# Patient Record
Sex: Female | Born: 1950 | Race: White | Hispanic: No | Marital: Married | Smoking: Former smoker
Health system: Western US, Academic
[De-identification: ages and names within clinical notes are randomized; demographics above are authoritative.]

## PROBLEM LIST (undated history)

## (undated) DIAGNOSIS — M3500 Sicca syndrome, unspecified: Secondary | ICD-10-CM

## (undated) DIAGNOSIS — I272 Pulmonary hypertension, unspecified: Secondary | ICD-10-CM

## (undated) MED ORDER — FUROSEMIDE 40 MG OR TABS
ORAL_TABLET | ORAL | 1 refills | Status: AC
Start: 2016-11-05 — End: ?

## (undated) MED ORDER — WARFARIN SODIUM 5 MG OR TABS
5.00 mg | ORAL_TABLET | Freq: Every day | ORAL | Status: AC
Start: 2013-02-21 — End: ?

## (undated) MED ORDER — CALCIUM ACETATE (PHOS BINDER) 667 MG/5ML PO SOLN
667.00 mg | Freq: Three times a day (TID) | ORAL | 0 refills | Status: AC
Start: 2017-08-27 — End: ?

## (undated) MED ORDER — FUROSEMIDE 40 MG OR TABS
80.00 mg | ORAL_TABLET | Freq: Three times a day (TID) | ORAL | 0 refills | Status: AC
Start: 2017-08-27 — End: ?

## (undated) MED ORDER — DAPSONE 100 MG OR TABS
100.00 mg | ORAL_TABLET | Freq: Every day | ORAL | 1 refills | Status: AC
Start: 2016-06-21 — End: ?

## (undated) MED ORDER — VITAMIN D 1000 UNIT OR TABS
1000.00 [IU] | ORAL_TABLET | Freq: Every day | ORAL | Status: AC
Start: 2013-02-21 — End: ?

## (undated) MED ORDER — POTASSIUM CHLORIDE CRYS CR 20 MEQ OR TBCR
20.00 meq | EXTENDED_RELEASE_TABLET | Freq: Every day | ORAL | 0 refills | Status: AC
Start: 2017-08-27 — End: ?

## (undated) MED ORDER — GLYCINE DILUENT IV SOLN
29.00 ng/kg/min | INTRAVENOUS | Status: AC
Start: 2016-05-16 — End: ?

## (undated) MED ORDER — PREDNISONE 20 MG OR TABS
40.00 mg | ORAL_TABLET | Freq: Every day | ORAL | 0 refills | Status: AC
Start: 2016-06-21 — End: ?

## (undated) MED ORDER — POTASSIUM CHLORIDE CRYS CR 20 MEQ OR TBCR
20.00 meq | EXTENDED_RELEASE_TABLET | Freq: Three times a day (TID) | ORAL | Status: AC
Start: 2013-02-21 — End: ?

## (undated) MED ORDER — SILDENAFIL CITRATE 20 MG OR TABS
80.00 mg | ORAL_TABLET | Freq: Three times a day (TID) | ORAL | Status: AC
Start: 2013-02-21 — End: ?

## (undated) MED ORDER — FUROSEMIDE 40 MG OR TABS
100.00 mg | ORAL_TABLET | Freq: Three times a day (TID) | ORAL | 3 refills | Status: AC
Start: 2017-07-14 — End: ?

## (undated) MED ORDER — FUROSEMIDE 20 MG OR TABS
60.00 mg | ORAL_TABLET | Freq: Two times a day (BID) | ORAL | Status: AC
Start: 2013-02-20 — End: ?

## (undated) MED ORDER — DIGOXIN 0.125 MG OR TABS
250.00 ug | ORAL_TABLET | Freq: Every day | ORAL | Status: AC
Start: 2013-02-21 — End: ?

## (undated) MED ORDER — SACCHAROMYCES BOULARDII 250 MG OR CAPS
250.00 mg | ORAL_CAPSULE | Freq: Two times a day (BID) | ORAL | Status: AC
Start: 2017-07-01 — End: 2017-07-11

## (undated) MED ORDER — SPIRONOLACTONE 25 MG OR TABS
50.00 mg | ORAL_TABLET | Freq: Two times a day (BID) | ORAL | Status: AC
Start: 2013-02-20 — End: ?

## (undated) MED ORDER — DIGOX 125 MCG PO TABS
ORAL_TABLET | ORAL | 1 refills | Status: AC
Start: 2016-11-05 — End: ?

## (undated) MED ORDER — FOLIC ACID 400 MCG OR TABS
0.40 mg | ORAL_TABLET | Freq: Every day | ORAL | Status: AC
Start: 2013-02-21 — End: ?

## (undated) MED ORDER — GLYCINE DILUENT IV SOLN
35.00 ng/kg/min | INTRAVENOUS | Status: AC
Start: 2017-07-15 — End: ?

## (undated) MED ORDER — VANCOMYCIN 50 MG/ML ORAL SOLN (COMPOUNDED) (~~LOC~~)
125.00 mg | Freq: Two times a day (BID) | ORAL | Status: AC
Start: 2017-07-14 — End: 2017-07-26

## (undated) MED ORDER — NEPHROCAPS 1 MG OR CAPS
1.00 | ORAL_CAPSULE | Freq: Every day | ORAL | Status: AC
Start: 2017-08-13 — End: ?

---
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
## 2001-02-25 ENCOUNTER — Other Ambulatory Visit (INDEPENDENT_AMBULATORY_CARE_PROVIDER_SITE_OTHER): Payer: Self-pay | Admitting: Internal Medicine

## 2001-10-04 ENCOUNTER — Other Ambulatory Visit (INDEPENDENT_AMBULATORY_CARE_PROVIDER_SITE_OTHER): Payer: Self-pay | Admitting: Internal Medicine

## 2002-03-14 ENCOUNTER — Other Ambulatory Visit (INDEPENDENT_AMBULATORY_CARE_PROVIDER_SITE_OTHER): Payer: Self-pay | Admitting: Internal Medicine

## 2002-07-25 ENCOUNTER — Other Ambulatory Visit (INDEPENDENT_AMBULATORY_CARE_PROVIDER_SITE_OTHER): Payer: Self-pay | Admitting: Internal Medicine

## 2002-11-15 ENCOUNTER — Other Ambulatory Visit (INDEPENDENT_AMBULATORY_CARE_PROVIDER_SITE_OTHER): Payer: Self-pay | Admitting: Internal Medicine

## 2003-04-17 ENCOUNTER — Other Ambulatory Visit (HOSPITAL_BASED_OUTPATIENT_CLINIC_OR_DEPARTMENT_OTHER): Payer: Self-pay | Admitting: Internal Medicine

## 2003-04-17 LAB — URIC ACID, BLOOD: Uric Acid: 9.4 mg/dL — ABNORMAL HIGH (ref 2.2–5.7)

## 2003-04-17 LAB — PROTHROMBIN TIME, BLOOD
INR: 1.3
Protime, Control: 10 s
Protime: 12.6 s — ABNORMAL HIGH (ref 9–12)

## 2003-07-14 ENCOUNTER — Other Ambulatory Visit (HOSPITAL_BASED_OUTPATIENT_CLINIC_OR_DEPARTMENT_OTHER): Payer: Self-pay | Admitting: Internal Medicine

## 2003-07-14 LAB — HEMOGRAM, BLOOD
Hct: 41.7 % (ref 36.0–46.0)
Hgb: 13.8 gm/dL (ref 12.0–16.0)
MCH: 26.2 pg — ABNORMAL LOW (ref 27–31)
MCHC: 33 % (ref 32–37)
MCV: 79.2 um3 — ABNORMAL LOW (ref 82.0–98.0)
MPV: 8.8 fL (ref 7.4–10.4)
Plt Count: 178 10*3/uL (ref 130–400)
RBC: 5.26 10*6/uL — ABNORMAL HIGH (ref 4.00–5.00)
RDW: 17.8 % — ABNORMAL HIGH (ref 10–15)
WBC: 4.6 10*3/uL (ref 4.0–11.0)

## 2003-07-14 LAB — PROTHROMBIN TIME, BLOOD
INR: 1.2
Protime, Control: 10.1 s
Protime: 12.3 s — ABNORMAL HIGH (ref 9–12)

## 2003-07-19 LAB — AEROBIC PANEL-RTNCL/GRMSM

## 2003-07-31 ENCOUNTER — Ambulatory Visit: Admitting: Diagnostic Radiology

## 2003-08-11 ENCOUNTER — Other Ambulatory Visit (HOSPITAL_BASED_OUTPATIENT_CLINIC_OR_DEPARTMENT_OTHER): Payer: Self-pay | Admitting: Internal Medicine

## 2003-08-11 LAB — COMPREHENSIVE METABOLIC PANEL, BLOOD
ALT (SGPT): 12 [IU]/L (ref 10–45)
AST (SGOT): 18 [IU]/L (ref 10–45)
Albumin: 3.6 g/dL (ref 3.3–5.0)
Alkaline Phos: 64 [IU]/L (ref 30–130)
BUN: 18 mg/dL (ref 8–18)
Bicarbonate: 25 meq/L (ref 24–31)
Bilirubin, Tot: 0.9 mg/dL (ref <1.2)
Calcium: 8.8 mg/dL (ref 8.8–10.3)
Chloride: 106 meq/L (ref 97–107)
Creatinine: 1 mg/dL (ref 0.5–1.5)
Glucose: 90 mg/dL (ref 65–110)
Potassium: 4.2 meq/L (ref 3.5–5.0)
Sodium: 138 meq/L (ref 135–145)
Total Protein: 7.7 g/dL (ref 6.0–8.0)

## 2003-08-11 LAB — HEMOGRAM, BLOOD
Hct: 39.6 % (ref 36.0–46.0)
Hgb: 13.2 g/dL (ref 12.0–16.0)
MCH: 25.9 pg — ABNORMAL LOW (ref 27–31)
MCHC: 33.2 % (ref 32–37)
MCV: 77.9 um3 — ABNORMAL LOW (ref 82.0–98.0)
Plt Count: 177 10*3/uL (ref 130–400)
RBC: 5.09 10*6/uL — ABNORMAL HIGH (ref 4.00–5.00)
RDW: 16.3 % — ABNORMAL HIGH (ref 10–15)
WBC: 5.4 10*3/uL (ref 4.0–11.0)

## 2003-08-11 LAB — PROTHROMBIN TIME, BLOOD
INR: 1.1
Protime, Control: 10.1 s
Protime: 11.5 s (ref 9–12)

## 2003-08-21 ENCOUNTER — Ambulatory Visit: Admitting: Diagnostic Radiology

## 2003-08-28 LAB — FUNGAL CULTURE

## 2003-09-08 ENCOUNTER — Ambulatory Visit (HOSPITAL_BASED_OUTPATIENT_CLINIC_OR_DEPARTMENT_OTHER): Admitting: Internal Medicine

## 2003-09-08 LAB — PROTHROMBIN TIME, BLOOD
INR: 1.8
Protime, Control: 10.1 s
Protime: 17.9 s — ABNORMAL HIGH (ref 9–12)

## 2003-09-21 ENCOUNTER — Encounter (HOSPITAL_BASED_OUTPATIENT_CLINIC_OR_DEPARTMENT_OTHER): Payer: Self-pay | Admitting: Internal Medicine

## 2003-09-22 NOTE — Progress Notes (Signed)
CLINIC: [facility]      REPORT TYPE: NOTE      Dictating Practitioner: Darnelle Catalan, M.D.    DATE OF SERVICE: 09/08/2003    REASON FOR VISIT: FOLLOWUP        PRINCIPLE DIAGNOSIS: Pulmonary arterial hypertension.    CURRENT MEDICATIONS: Flolan 29 ng/kg/day. Lasix 80 mg in the morning and  40 mg in the evening. Potassium 20 mEq b.i.d. Aldactone 50 mg b.i.d.  Digoxin 0.25 mg per day. Coumadin 5 mg alternating with 2.5 mg every other  day. Folate 400 mcg per day. Estring. Sildenafil 80 mg t.i.d.    HISTORY: Ms. Art reports that she feels well and her overall  activity level is stable.    PHYSICAL EXAMINATION: VITAL SIGNS: Pulse 75 and regular. Blood pressure  92/64. Weight 164 pounds. Resting room air oxygen saturation 98%. LUNGS:  Clear. CARDIAC: Shows a regular rate and rhythm with an accentuated P2  and a grade 1/6 systolic murmur. Her Hickman site site is mildly  erythematous without tenderness or exudate. EXTREMITIES: No edema.    SIX-MINUTE WALK: 511 meters.    IMPRESSION: Stable NYHA class II.    PLAN: Continue current medical regimen. Return for followup per protocol.                      Signature Derived From Controlled Access Password  Darnelle Catalan, M.D. 09/22/2003 06:54          DD: 09/21/2003 DT: 09/21/2003 4:42 P DocNo.: [id number]  [id number]      Primary Care Physician:  Belva Bertin N.P.  [address]   PCP_Address[address]  [place], [place]    cc:

## 2003-12-25 ENCOUNTER — Encounter (INDEPENDENT_AMBULATORY_CARE_PROVIDER_SITE_OTHER): Payer: Self-pay | Admitting: Internal Medicine

## 2003-12-25 ENCOUNTER — Ambulatory Visit (HOSPITAL_BASED_OUTPATIENT_CLINIC_OR_DEPARTMENT_OTHER): Admitting: Internal Medicine

## 2003-12-27 ENCOUNTER — Other Ambulatory Visit (INDEPENDENT_AMBULATORY_CARE_PROVIDER_SITE_OTHER): Payer: Self-pay | Admitting: Internal Medicine

## 2003-12-27 ENCOUNTER — Other Ambulatory Visit (INDEPENDENT_AMBULATORY_CARE_PROVIDER_SITE_OTHER): Payer: Self-pay | Admitting: Vascular & Interventional Radiology

## 2003-12-27 LAB — PROTHROMBIN TIME, BLOOD
INR: 1.1
Protime, Control: 10.1 s
Protime: 11.4 s (ref 9–12)

## 2003-12-27 LAB — HEMOGRAM, BLOOD
Hct: 40 % (ref 36.0–46.0)
Hgb: 13.4 g/dL (ref 12.0–16.0)
MCH: 26.6 pg — ABNORMAL LOW (ref 27–31)
MCHC: 33.4 % (ref 32–37)
MCV: 79.6 um3 — ABNORMAL LOW (ref 82.0–98.0)
MPV: 7.5 fL (ref 7.4–10.4)
Plt Count: 143 10*3/uL (ref 130–400)
RBC: 5.02 10*6/uL — ABNORMAL HIGH (ref 4.00–5.00)
RDW: 16.1 % — ABNORMAL HIGH (ref 10–15)
WBC: 5.1 10*3/uL (ref 4.0–11.0)

## 2003-12-28 LAB — HIV 1/2 ANTIBODY & P24 ANTIGEN ASSAY, BLOOD: HIV 1/2 Antibody & P24 Antigen Assay: NONREACTIVE

## 2003-12-28 LAB — HEPATITIS C AB, BLOOD: Hepatitis C Ab: NONREACTIVE

## 2003-12-28 LAB — HEPATITIS B SURFACE AG, BLOOD: HBsAg: NONREACTIVE

## 2004-01-01 ENCOUNTER — Ambulatory Visit: Admitting: Vascular & Interventional Radiology

## 2004-01-04 LAB — ANAEROBIC CULTURE/GRAM STAIN

## 2004-01-10 NOTE — Progress Notes (Signed)
 CLINIC: [facility]      REPORT TYPE: NOTE      Dictating Practitioner: Darnelle Catalan, M.D.    DATE OF SERVICE: 12/25/2003    REASON FOR VISIT: FOLLOWUP        PRINCIPAL DIAGNOSIS: Pulmonary arterial hypertension.    CURRENT MEDICATIONS: Flolan 29 ng/kg per minute, sildenafil 80 mg three  times a day, Lasix 80 mg in the morning and 40 mg in the evening, potassium  20 mEq twice per day, Aldactone 50 mg twice per day, digoxin 0.25 mg per  day, Coumadin 5 mg alternating with 2.5 mg every other day, folate 400 mcg  per day, and Estring.    HISTORY OF PRESENT ILLNESS: Diana Chambers returns for followup per  protocol of the open label combination trial with Flolan plus sildenafil.  Her only new complaint is some erythema, itching and tenderness at her  Hickman exit site. She has not had fever, chills or night sweats. Her  degree of dyspnea is unchanged.    PHYSICAL EXAMINATION: VITAL SIGNS: Pulse 75 and regular, blood pressure  110/70, resting room air oxygen saturation 97%. The Hickman exit site is  erythematous and mildly to moderately tender. The cuff of the catheter  appears to be extruding from the exit site. There is no expressible  exudate. LUNGS: Clear. CARDIAC: Examination shows a right-sided S4  gallop and an accentuated P2. EXTREMITIES: No edema.    IMPRESSION  1. Stable NYHA Class II PAH.  2. Hickman tunnel site infection.    PLAN: We will arrange for this catheter to be removed and a replacement  catheter to be put in. We will initiate antibiotic therapy for this to  cover for the usual organisms. The remainder of her regimen will remain  unchanged. She will return for followup per protocol, in approximately 3  months.                        Signature Derived From Controlled Access Password  Darnelle Catalan, M.D. 01/10/2004 09:58          DD: 01/05/2004 DT: 01/09/2004 8:45 P DocNo.: [id number]  [email or web address] [id number]        cc:

## 2004-03-14 ENCOUNTER — Encounter (INDEPENDENT_AMBULATORY_CARE_PROVIDER_SITE_OTHER): Payer: Self-pay | Admitting: Internal Medicine

## 2004-03-14 ENCOUNTER — Ambulatory Visit (INDEPENDENT_AMBULATORY_CARE_PROVIDER_SITE_OTHER): Admitting: Internal Medicine

## 2004-05-20 ENCOUNTER — Encounter (INDEPENDENT_AMBULATORY_CARE_PROVIDER_SITE_OTHER): Payer: Self-pay | Admitting: Internal Medicine

## 2004-05-20 ENCOUNTER — Ambulatory Visit (INDEPENDENT_AMBULATORY_CARE_PROVIDER_SITE_OTHER): Admitting: Internal Medicine

## 2004-08-26 ENCOUNTER — Ambulatory Visit (INDEPENDENT_AMBULATORY_CARE_PROVIDER_SITE_OTHER): Admitting: Internal Medicine

## 2004-08-27 NOTE — Progress Notes (Signed)
CL[facility]IC: [facility]      REPORT TYPE: NOTE      Dictating Practitioner: Darnelle Catalan, M.D.      DATE OF SERVICE: 08/26/2004    REASON FOR VISIT: FOLLOWUP      PRINCIPLE DIAGNOSIS: Pulmonary arterial hypertension.    CURRENT MEDICATIONS: Flolan 29 ng/kg per minute, Lasix 80 mg in the  morning and 40 mg in the evening, potassium 20 mEq b.i.d., Aldactone 50 mg  b.i.d., digoxin 0.25 mg per day, Coumadin 5 mg alternating with 2.5 mg  every other day, folate 400 mg per day, Estring, and sildenafil 80 mg  t.i.d.    HISTORY OF PRESENT ILLNESS: Ms. Coderre returns for followup of  pulmonary arterial hypertension on the extension protocol of the sildenafil  plus Flolan study. She does note some fatigue but is otherwise unchanged.  Her activity tolerance has been good.    PHYSICAL EXAMINATION: VITAL SIGNS: Pulse 74 and regular, blood pressure  100/60, weight 163 pounds. Resting room air oxygen saturation 96%. LUNGS:  Clear. CARDIAC: Regular rate and rhythm. There is a right-sided S4  gallop and an accentuated P2. EXTREMITIES: No edema.    STUDIES: Six-minute walk: 507 meters. This is stable compared to prior  study.    IMPRESSION: Stable NYHA class II.    PLAN: Continue current medical regimen. Return for followup per  protocol.                        Signature Derived From Controlled Access Password  Darnelle Catalan, M.D. 08/27/2004 14:31      DD: 08/27/2004 DT: 08/27/2004 2:24 P DocNo.: [id number]  [email or web address] [id number]    Referring Physician:  Henreitta Leber MD  [address]  [place] [address]    Primary Care Physician:  Henreitta Leber MD  [facility] [place], [place] [address]    cc:

## 2004-11-11 ENCOUNTER — Ambulatory Visit (INDEPENDENT_AMBULATORY_CARE_PROVIDER_SITE_OTHER): Admitting: Internal Medicine

## 2004-11-25 NOTE — Progress Notes (Signed)
CL[facility]IC: [facility]    REPORT TYPE: NOTE    Dictating Practitioner: Darnelle Catalan, M.D.    DATE OF SERVICE: 11/11/2004    REASON FOR VISIT: PULMONARY ARTERIAL HYPERTENSION      PRINCIPLE DIAGNOSIS: Pulmonary arterial hypertension.    CURRENT MEDICATIONS: Flolan 29 ng/kg per minute, Lasix 60 mg in the  morning and 40 mg in the evening, potassium 20 mEq b.i.d., Aldactone 50 mg  b.i.d., digoxin 0.25 mg per day, Coumadin 5 mg alternating with 2.5 mg  every other day, folate 400 mg per day, Estring, and sildenafil 80 mg  t.i.d.    HISTORY OF PRESENT ILLNESS: Ms. Omeara returns for followup per  protocol of pulmonary arterial hypertension on the open-label extension  PACES study. She is feeling well and has no new complaints.    PHYSICAL EXAMINATION: VITAL SIGNS: Pulse 74 and regular, blood pressure  110/70, weight 162 pounds. Resting room air oxygen saturation 97%. NECK:  The jugular venous pulse is flat. LUNGS: Clear. CARDIAC: Regular rate  and rhythm with an accentuated P2 and a grade 2/6 systolic murmur at the  left sternal border. EXTREMITIES: No edema. SKIN: A prominent Flolan  rash is noted.    STUDIES: Six-minute walk: 516 meters. This is stable compared with prior  studies.    IMPRESSION: Stable NYHA class II.    PLAN: Continue current medical regimen. Return for followup in 3 to 4  months.                        Signature Derived From Controlled Access Password  Darnelle Catalan, M.D. 11/25/2004 14:00      DD: 11/20/2004 DT: 11/20/2004 7:27 P DocNo.: [id number]  [email or web address] [id number]      Primary Care Physician:  Henreitta Leber MD  [facility] [place], [place] [address]    cc:

## 2005-02-10 ENCOUNTER — Ambulatory Visit (INDEPENDENT_AMBULATORY_CARE_PROVIDER_SITE_OTHER): Admitting: Internal Medicine

## 2005-02-10 ENCOUNTER — Encounter (INDEPENDENT_AMBULATORY_CARE_PROVIDER_SITE_OTHER): Payer: Self-pay | Admitting: Internal Medicine

## 2005-04-28 ENCOUNTER — Ambulatory Visit (INDEPENDENT_AMBULATORY_CARE_PROVIDER_SITE_OTHER): Admitting: Internal Medicine

## 2005-08-06 ENCOUNTER — Ambulatory Visit (INDEPENDENT_AMBULATORY_CARE_PROVIDER_SITE_OTHER)

## 2005-08-07 ENCOUNTER — Ambulatory Visit (INDEPENDENT_AMBULATORY_CARE_PROVIDER_SITE_OTHER): Admitting: Internal Medicine

## 2005-08-07 DIAGNOSIS — I2721 Secondary pulmonary arterial hypertension: Secondary | ICD-10-CM

## 2005-08-07 NOTE — Progress Notes (Signed)
This office note has been dictated.

## 2005-08-26 NOTE — Progress Notes (Signed)
CL[facility]IC: [facility]    REPORT TYPE: NOTE    Dictating Practitioner: Darnelle Catalan, M.D.    DATE OF SERVICE: 08/07/2005    REASON FOR VISIT: FOLLOWUP PULMONARY ARTERIAL HYPERTENSION      PRINCIPAL DIAGNOSIS: Pulmonary arterial hypertension.    CURRENT MEDICATIONS: Flolan 29 ng/kg/minute, Lasix 80 mg in the morning  and 40 mg in the evening, potassium 40 mEq in the morning and 20 mEq in the  evening, Aldactone 50 mg b.i.d., digoxin 0.25 mg per day, Coumadin 5 mg  alternating with 2.5 mg every other day, folate 400 mcg per day, Estring,  sildenafil 80 mg t.i.d.    HISTORY OF THE PRESENT ILLNESS: [name] returns for followup of  pulmonary arterial hypertension. She remains clinically stable and has  minimal exertional dyspnea.    PHYSICAL EXAMINATION: VITAL SIGNS: Pulse 78 and regular. Blood pressure  120/60. Resting room air oxygen saturation 97%. LUNGS: Clear. NECK:  The jugular venous pulse is flat. CARDIAC: Cardiac examination shows an  accentuated P2 and a grade 2/6 systolic murmur at the lower left sternal  border. EXTREMITIES: Her extremities have a typical Flolan rash. No  cyanosis or edema are noted.    LABORATORY STUDIES: A 6-minute walk today was 509 meters. This is stable  compared with prior studies.    IMPRESSION: Stable New York Heart Association class II.    PLAN: Continue current medical regimen. I will see her in followup in  approximately 3 months.                        Signature Derived From Controlled Access Password  Darnelle Catalan, M.D. 08/26/2005 03:18 P        DD: 08/26/2005 DT: 08/26/2005 12:07 P DocNo.: [id number]  [email or web address] [id number]      Primary Care Physician:  Henreitta Leber MD  [facility] [place], [place] [address]    cc:

## 2005-10-13 ENCOUNTER — Encounter (INDEPENDENT_AMBULATORY_CARE_PROVIDER_SITE_OTHER): Admitting: Internal Medicine

## 2005-10-13 ENCOUNTER — Ambulatory Visit (INDEPENDENT_AMBULATORY_CARE_PROVIDER_SITE_OTHER): Admitting: Internal Medicine

## 2005-10-13 ENCOUNTER — Encounter (INDEPENDENT_AMBULATORY_CARE_PROVIDER_SITE_OTHER): Payer: Self-pay | Admitting: Internal Medicine

## 2005-10-27 NOTE — Progress Notes (Signed)
CL[facility]IC: [facility]    REPORT TYPE: NOTE    Dictating Practitioner: Darnelle Catalan, M.D.    DATE OF SERVICE: 10/13/2005    REASON FOR VISIT: PULMONARY ARTERIAL HYPERTENSION      PRINCIPAL DIAGNOSIS: Pulmonary arterial hypertension.    CURRENT MEDICATIONS: Flolan 29 ng/kg per min, Lasix 80 mg in the morning  and 40 mg in the evening, potassium 60 mEq per day, Aldactone 50 mg b.i.d.,  digoxin 0.25 mg/day, Coumadin 5 mg alternating with 2.5 mg every other day,  folate 400 mcg/day, sildenafil 80 mg t.i.d.    [name] returns for followup of pulmonary arterial hypertension. She  reports that her clinical status is unchanged. She is physically active  with mild exertional dyspnea and fatigue.    PHYSICAL EXAMINATION: VITAL SIGNS: Pulse 98 and regular, blood pressure  112/60. Weight 166 pounds, resting room air oxygen saturation 95%, jugular  venous pulse is flat. LUNGS: Clear. CARDIAC: Examination shows an  accentuated P2, and a grade 1/6 systolic murmur at the lower left sternal  border. EXTREMITIES: Have no edema.    DIAGNOSTIC STUDIES: A 6-minute walk today was 518 m. This is stable  compared with prior studies. INR was 1.3.    IMPRESSION: Stable NYHA class II.    PLAN: We will increase her Coumadin to 5-5-2.5 mg. She will continue the  remainder of her regimen unchanged. I will see her in followup in 3 to 4  months.                        Signature Derived From Controlled Access Password  Darnelle Catalan, M.D. 10/27/2005 05:26 A        DD: 10/20/2005 DT: 10/22/2005 07:11 P DocNo.: [id number]  [email or web address] [id number]      Primary Care Physician:  Henreitta Leber MD  [facility] [place], [place] [address]    cc:

## 2005-10-28 NOTE — Progress Notes (Signed)
This office note has been dictated.

## 2006-01-16 ENCOUNTER — Other Ambulatory Visit (INDEPENDENT_AMBULATORY_CARE_PROVIDER_SITE_OTHER): Payer: Self-pay | Admitting: Internal Medicine

## 2006-01-19 ENCOUNTER — Other Ambulatory Visit (INDEPENDENT_AMBULATORY_CARE_PROVIDER_SITE_OTHER): Payer: Self-pay | Admitting: Internal Medicine

## 2006-01-19 ENCOUNTER — Encounter (INDEPENDENT_AMBULATORY_CARE_PROVIDER_SITE_OTHER): Admitting: Internal Medicine

## 2006-01-19 ENCOUNTER — Encounter (INDEPENDENT_AMBULATORY_CARE_PROVIDER_SITE_OTHER): Payer: Self-pay | Admitting: Internal Medicine

## 2006-01-28 NOTE — Progress Notes (Signed)
CLINIC: [facility]    REPORT TYPE: NOTE    Dictating Practitioner: Darnelle Catalan, M.D.    DATE OF SERVICE: 01/19/2006    REASON FOR VISIT: FOLLOWUP      PRINCIPAL DIAGNOSIS: Pulmonary arterial hypertension.    CURRENT MEDICATIONS: Flolan 29 ng/kg/min, Lasix 80 mg in the morning and  40 mg in the evening, potassium 40 mEq in the morning and 20 mEq in the  evening, Aldactone 50 mg b.i.d., digoxin 0.25 mg/day, Coumadin 5 mg 5 days  per week and 2.5 mg 2 days per week, folate 400 mcg per day, Estring and  sildenafil 80 mg t.i.d.    [name] returns for followup of pulmonary arterial hypertension. She  is physically active and has no new complaints.    PHYSICAL EXAMINATION: VITAL SIGNS: Pulse 83 and regular, blood pressure  127/78, weight 166 pounds, resting room air oxygen saturation 96%. NECK:  The jugular venous pulse is flat. LUNGS: Clear. CARDIAC: Shows a regular  rate and rhythm, with an accentuated P2. There is a grade 1/6 systolic  murmur at the lower left sternal border. ABDOMEN: Without ascites or  hepatosplenomegaly. Her catheter exit site looks clean. EXTREMITIES:  Show no cyanosis, clubbing, or edema.    LABORATORY DATA: INR 1.6.    Her 6-minute walk today was 522 meters, which is stable compared with prior  studies.    IMPRESSION: Stable NYHA functional class II.    PLAN: We will increase her Coumadin to 5 mg/day with the goal being an INR  of 2.0 to 2.5. She will get an INR checked in approximately 2 weeks. We  will schedule her for a right heart catheterization to assess her  hemodynamic status within the next several months, since it has been  several years since her last catheterization.                        Signature Derived From Controlled Access Password  Darnelle Catalan, M.D. 01/28/2006 11:46 A        DD: 01/27/2006 DT: 01/28/2006 10:50 A DocNo.: [id number]  [id number]      Primary Care Physician:  Henreitta Leber MD  [facility] [place], [place] [address]    cc:

## 2006-03-30 ENCOUNTER — Other Ambulatory Visit (INDEPENDENT_AMBULATORY_CARE_PROVIDER_SITE_OTHER): Payer: Self-pay | Admitting: Internal Medicine

## 2006-03-30 ENCOUNTER — Ambulatory Visit: Admitting: Internal Medicine

## 2006-03-30 ENCOUNTER — Encounter (INDEPENDENT_AMBULATORY_CARE_PROVIDER_SITE_OTHER): Admitting: Internal Medicine

## 2006-03-30 ENCOUNTER — Encounter (INDEPENDENT_AMBULATORY_CARE_PROVIDER_SITE_OTHER): Payer: Self-pay | Admitting: Internal Medicine

## 2006-03-31 NOTE — Op Note (Signed)
Dictating Practitioner: Darnelle Catalan, M.D.     Staff Physician: Darnelle Catalan, M.D.    Date of Operation: 03/30/2006        PREPROCEDURE DIAGNOSIS: Pulmonary arterial hypertension.  POSTPROCEDURE DIAGNOSIS: Pulmonary arterial hypertension.  PROCEDURE: Right heart catheterization.  SURGEON/STAFF: Marrian Salvage    INDICATIONS: The patient is a 56 year old woman with idiopathic pulmonary  arterial hypertension treated with intravenous epoprostenol and oral  sildenafil. She is undergoing right heart catheterization to assess her  hemodynamic status.    PROCEDURE: After informed consent the patient was brought to the  catheterization laboratory and the left groin was prepped and draped in the  usual manner. After local infiltration with Xylocaine the left femoral vein  was located and cannulated with an 8-French sheath. A 7.5-French Swan-Ganz  catheter with a 0.032-inch internal wire was then advanced under  fluoroscopic guidance into the right heart chambers. After baseline  hemodynamic measurements were obtained nitric oxide 20 ppm was administered  by inhalation and repeat hemodynamic measurements were performed. At the  termination of the vasodilator trial catheters were removed. Hemostasis was  obtained, and the patient was transferred to recovery in stable condition.    FINDINGS: Baseline (Flolan at 29 ng/kg per minute: Heart rate 67. Room air  arterial oxygen saturation 95%. Mixed venous oxygen saturation 72%. Right  atrial pressure mean 10, pulmonary artery pressure 72/29 (mean 45),  pulmonary capillary wedge pressure 10. Systemic blood pressure 130/70 (mean  97), cardiac output 5.13 L/min. Pulmonary vascular resistance 6.8 Wood  units.    Nitric oxide 20 PPM: Heart rate 63. Arterial oxygen saturation 99%, mixed  venous saturation 79%. Pulmonary artery pressure 60/25 (mean 38). Pulmonary  capillary wedge pressure 11. Systemic blood pressure 128/67 (mean 90).  Cardiac output 4.47 L/min. Pulmonary vascular  resistance 6.0 Wood units.    IMPRESSION  1. Pulmonary arterial hypertension, hemodynamically improved compared with  her prior study approximately 3 years ago, and markedly improved compared  with her baseline catheterization from May 2001.    2. Minimal additional vasoreactivity in response to inhaled nitric oxide.                              Signature Derived From Controlled Access Password  Darnelle Catalan, M.D. 03/31/2006 03:18 P    DD: 03/30/2006 DT: 03/30/2006 08:35 P DocNo.: [id number]  [id number].[facility]     Referring Physician:   Henreitta Leber MD   [address]   [place], [place] [address]     Primary Care Physician:   Henreitta Leber MD   [facility] [place], [place] [address]    cc:

## 2006-04-02 ENCOUNTER — Inpatient Hospital Stay (INDEPENDENT_AMBULATORY_CARE_PROVIDER_SITE_OTHER): Payer: Self-pay | Admitting: Pulmonary Medicine

## 2006-04-02 ENCOUNTER — Emergency Department: Payer: Self-pay

## 2006-04-02 ENCOUNTER — Other Ambulatory Visit (INDEPENDENT_AMBULATORY_CARE_PROVIDER_SITE_OTHER): Payer: Self-pay | Admitting: Emergency Medicine

## 2006-04-02 ENCOUNTER — Inpatient Hospital Stay (HOSPITAL_COMMUNITY): Admitting: Internal Medicine

## 2006-04-02 LAB — COMPREHENSIVE METABOLIC PANEL, BLOOD
ALT (SGPT): 12 IU/L (ref 10–45)
AST (SGOT): 16 IU/L (ref 10–45)
Albumin: 3.6 gm/dL (ref 3.3–5.0)
Alkaline Phos: 69 IU/L (ref 30–130)
BUN: 24 mg/dL — ABNORMAL HIGH (ref 8–18)
Bicarbonate: 24 mEq/L (ref 24–31)
Bilirubin, Tot: 0.8 mg/dL (ref <1.2)
Calcium: 9.9 mg/dL (ref 8.8–10.3)
Chloride: 102 mEq/L (ref 97–107)
Creatinine: 1 mg/dL (ref 0.5–1.5)
GFR (African Amer.): >60 mL/min
GFR: 57 mL/min
Glucose: 110 mg/dL (ref 65–110)
Potassium: 3.9 mEq/L (ref 3.5–5.0)
Sodium: 138 mEq/L (ref 135–145)
Total Protein: 8 gm/dL (ref 6.0–8.0)

## 2006-04-02 LAB — CBC WITH DIFF, BLOOD
Basophils: 0 % (ref 0–2)
Eosinophils: 1 % (ref 1–3)
Hct: 32 % — ABNORMAL LOW (ref 36.0–46.0)
Hgb: 10.7 gm/dL — ABNORMAL LOW (ref 12.0–16.0)
Lymphocytes: 30 % (ref 20–40)
MCH: 25.9 pg — ABNORMAL LOW (ref 27–31)
MCHC: 33.3 % (ref 32–37)
MCV: 77.8 um3 — ABNORMAL LOW (ref 82.0–98.0)
MPV: 8.7 fl (ref 7.4–10.4)
Monocytes: 5 % (ref 1–10)
Plt Count: 152 10*3/uL (ref 130–400)
RBC: 4.11 10*6/uL (ref 4.00–5.00)
RDW: 17.6 % — ABNORMAL HIGH (ref 10–15)
Segs: 64 % (ref 45–70)
WBC: 5.7 10*3/uL (ref 4.0–11.0)

## 2006-04-02 LAB — SED RATE, BLOOD: Sed Rate: 65 mm/hr — ABNORMAL HIGH (ref 0–30)

## 2006-04-02 LAB — APTT, BLOOD: PTT: 30.6 s (ref 25.0–34.0)

## 2006-04-02 LAB — DIGOXIN, BLOOD: Digoxin: 0.9 ng/mL (ref 0.5–2.0)

## 2006-04-02 LAB — ADIF: Plt Est: NORMAL

## 2006-04-02 LAB — PROTHROMBIN TIME, BLOOD
INR: 1.2
PT,Patient: 10.2 s — ABNORMAL HIGH (ref 7.0–10.0)

## 2006-04-02 LAB — C-REACTIVE PROTEIN, BLOOD: CRP: 5.5 mg/dL — ABNORMAL HIGH (ref <1.00)

## 2006-04-04 ENCOUNTER — Inpatient Hospital Stay (INDEPENDENT_AMBULATORY_CARE_PROVIDER_SITE_OTHER): Payer: Self-pay | Admitting: Critical Care Medicine

## 2006-04-06 NOTE — H&P (Signed)
Dictating Practitioner: Donnel Saxon. Jake Samples, M.D.     Staff Physician: Juliette Alcide, M.D.    Date of Admission: 04/02/2006        HISTORY AND PHYSICAL    CHIEF COMPLAINT: Left groin pain.    HISTORY OF PRESENT ILLNESS: This is a 56 year old woman with pulmonary  arterial hypertension who underwent routine right heart catheterization on  Monday, March 17. The patient had bleeding initially post-cath which  resolved. She stayed in the PACU for a little over 3 hours before driving  home to [place]. Over the next few days, she had an enlarging bruise. On  the day prior to admission, she had a hard knot in the left anterior thigh,  which was very painful and eventually improved. However, her anterior  thigh became painful and warm to touch. She felt subjective fever as she  drove in to [facility]. In the emergency department, she had an ultrasound  of the leg, which revealed a large pseudoaneurysm extending laterally.    PAST MEDICAL HISTORY: Pulmonary arterial hypertension, with her last PA  pressure 72/29, with a mean of 45, cardiac output of 5.13, and a PVR of 6.8  Woods units. She has Sjogren's. Tonsillectomy.    MEDICATIONS: Her medications include Flolan at 29 ng/kg per minute;  potassium chloride 40 mEq p.o. every morning and 20 mEq p.o. every evening;  Aldactone 50 mg p.o. twice per day; Lasix 80 mg p.o. every morning and 40  mg p.o. every evening; folic acid 400 mcg p.o. daily; digoxin 0.25 mcg p.o.  daily; Coumadin 5 mg p.o. daily, which has been on hold for the last week  and a half; sildenafil 80 mg p.o. three times a day; and Tylenol 500 mg  p.o. every 6 hours p.r.n. pain.    ALLERGIES: HER ALLERGIES INCLUDE QUINOLONES, WHICH GIVE JOINT PAINS AND  VOMITING.    SOCIAL HISTORY: She is married. She has a son. She is from [place],  but lives in [place]. 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 She smoked  over 24 years ago.    FAMILY HISTORY: Both her mother and father had cardiac disease.    REVIEW OF SYSTEMS:  As per HPI.    PHYSICAL EXAMINATION: VITAL SIGNS: Temperature 98.7, pulse 93,  respiratory rate 16, O2 saturation 96% on room air, blood pressure 122/75.  GENERAL: She is awake, alert and oriented x3, in no acute distress. SKIN:  Remarkable for bruise, mostly on the anterior aspect of the left thigh, but  also tracking posteriorly. HEENT: Head normocephalic, atraumatic. Eyes  are anicteric. Oropharynx is clear. NECK: Supple, with no JVD. Thyroid  has no large masses. BACK: No spinal tenderness or CVA tenderness.  LUNGS: Clear to auscultation bilaterally, without crackles or wheezes.  CVS: Regular rate and rhythm, loud S2, with intermittent third heart sound  that is prominent, a 2/6 systolic ejection murmur at the left lower sternal  border. Strong distal pulses. ABDOMEN: Soft, with bowel sounds present,  nontender, nondistended. EXTREMITIES: Demonstrate a large bruise on the  left anterior thigh, with a bruits. NEUROLOGIC: Nonfocal.    LABORATORY DATA: White blood cell count 5.7; hemoglobin 10.7, which is  down from prior; platelets are 152. INR is 1.2. Chemistry and LFTs are  within normal limits. Her CRP is 5.5.    IMAGING: Ultrasound of the left groin reveals a large pseudoaneurysm  extending laterally.    ASSESSMENT AND PLAN: A 56 year old woman with pulmonary arterial  hypertension and pseudoaneurysm,  post-cath.    1. Pseudoaneurysm. Will attempt conservative management at first with  compression. We will consult vascular surgery and consider interventional  radiology for repair. We are holding Coumadin.    2. Pulmonary arterial hypertension. Will continue the Flolan, Aldactone,  Lasix, sildenafil, and digoxin.    3. Diet is regular.    4. DVT prophylaxis. Venodynes.    5. CODE STATUS: FULL CODE/FULL CARE.    This patient was discussed with Dr. Selena Batten, who concurs.            Job Number [id number]            Reviewed & Electronically Signed  Donnel Saxon. Jake Samples, M.D. 04/03/2006 05:50 P    Signature Derived From  Controlled Access Password  Juliette Alcide, M.D. 04/06/2006 02:09 P    DD: 04/02/2006 DT: 04/03/2006 12:28 A DocNo.: [id number]  [id number]    Referring Physician:  Lowell Guitar MD  [facility]  [place] [address]    Primary Care Physician:  Henreitta Leber MD  [facility] [place], [place] [address]    cc:

## 2006-04-08 LAB — BLOOD CULTURE

## 2006-04-21 NOTE — Discharge Summary (Signed)
Dictating Practitioner: Ortencia Kick, M.D.     Staff Physician: Ewing Schlein. Kirke Shaggy, M.D.    Date of Admission: 04/02/2006  Date of Discharge: 04/04/2006        DISCHARGE DIAGNOSES: (1) Left femoral artery pseudoaneurysm. (2)  Pulmonary arterial hypertension.    HISTORY OF PRESENT ILLNESS: The patient is a 56 year old female with  pulmonary arterial hypertension who underwent routine right heart  catheterization on Monday, 03/30/06. The patient had bleeding  post-catheterization and stayed in the observation unit for several hours  before being discharged home. The patient noted a hard "knot" on the left  posterior thigh which was painful and eventually went away. Subsequently  the patient noticed painful and warm anterior thigh and subjective fevers  which brought her into the hospital.    HOSPITAL COURSE: The patient was admitted to the hospital. A diagnosis of  left femoral artery pseudoaneurysm was made. Interventional Radiology was  consulted and the patient underwent an ultrasound-guided thrombin injection  of the left femoral artery pseudoaneurysm. Thrombosis of the  pseudoaneurysm was noted following injection of 1000 units of thrombin.  The patient was observed inpatient subsequently overnight. Today she has  no complaints and is without any symptoms. A repeat ultrasound was done  today which shows the thrombosed pseudoaneurysm without any abnormalities.  The patient has been advised to avoid heavy weight lifting; however, she is  able to resume most of her previous activities as tolerated except for  heavy weight lifting and strenuous exertion. The patient was also given  p.r.n. Benadryl for itching. The patient is advised to call back if she  were to notice return of symptoms or have any fevers.    DISCHARGE MEDICATIONS: The patient is to resume all of her previous  medications. The patient was also given Benadryl 25 mg p.o. q.6h. p.r.n.  for itching.    CONDITION ON DISCHARGE: Stable.    DISPOSITION:  To home.                        Reviewed & Electronically Signed  Ortencia Kick, M.D. 04/06/2006 05:04 P    Signature Derived From Controlled Access Password  Richard N. Kirke Shaggy, M.D. 04/21/2006 05:49 P    DD: 04/04/2006 DT: 04/05/2006 09:11 P DocNo.: [id number]  [id number].[facility]    Referring Physician:  Lowell Guitar MD  [facility]  [place] [address]    Primary Care Physician:  Henreitta Leber MD  [facility] [place], [place] [address]    cc:

## 2006-06-22 ENCOUNTER — Other Ambulatory Visit (INDEPENDENT_AMBULATORY_CARE_PROVIDER_SITE_OTHER): Payer: Self-pay | Admitting: Internal Medicine

## 2006-06-22 ENCOUNTER — Encounter (INDEPENDENT_AMBULATORY_CARE_PROVIDER_SITE_OTHER): Payer: Self-pay | Admitting: Internal Medicine

## 2006-06-22 ENCOUNTER — Encounter (INDEPENDENT_AMBULATORY_CARE_PROVIDER_SITE_OTHER): Admitting: Internal Medicine

## 2006-06-29 NOTE — Progress Notes (Signed)
CL[facility]IC: [facility]    REPORT TYPE: NOTE    Dictating Practitioner: Darnelle Catalan, M.D.    DATE OF SERVICE: 06/22/2006    REASON FOR VISIT: PULMONARY ARTERIAL HYPERTENSION      PRINCIPAL DIAGNOSIS: Pulmonary arterial hypertension.    CURRENT MEDICATIONS: Flolan 29 nanograms/kilogram per minute; Lasix 80 mg  in the morning, and 40 mg in the evening; potassium 40 mEq in the morning  and 20 mEq in the evening; Aldactone 50 mg b.i.d.; digoxin 0.25 mg per day;  Coumadin 5 mg per day; folate 400 mcg per day; E-string; sildenafil 80 mg  t.i.d.    Ms. Duba returns for follow up of pulmonary arterial hypertension.  She has no new complaints. Her pseudoaneurysm in her left groin, which was  a complication of her most recent catheterization has resolved without  residual pain.    PHYSICAL EXAMINATION: VITAL SIGNS: Pulse is 83 and regular. Blood  pressure is 110/60. Weight is 164 pounds, resting room air oxygen  saturation is 94%. NECK: The jugular venous pulse is flat. LUNGS: The  lungs are clear. CARDIAC: Examination shows regular rate and rhythm.  There is a grade 2/6 tricuspid regurgitant murmur and an accentuated P2.  EXTREMITIES: There is 1 plus clubbing, without cyanosis or edema.    Six-minute walk today is 510 meters. This is stable compared with prior  studies.    IMPRESSION: Stable NYHA class 2.    PLAN: Continue current medical regimen. Return for followup in 4 to 6  months.                        Signature Derived From Controlled Access Password  Darnelle Catalan, M.D. 06/29/2006 06:32 A        DD: 06/26/2006 DT: 06/26/2006 11:08 P DocNo.: [id number]  [id number]      Primary Care Physician:  Henreitta Leber MD  [facility] [place], [place] [address]    cc:

## 2006-08-07 ENCOUNTER — Other Ambulatory Visit (INDEPENDENT_AMBULATORY_CARE_PROVIDER_SITE_OTHER): Payer: Self-pay | Admitting: Internal Medicine

## 2006-08-26 ENCOUNTER — Other Ambulatory Visit (INDEPENDENT_AMBULATORY_CARE_PROVIDER_SITE_OTHER): Payer: Self-pay | Admitting: Internal Medicine

## 2006-09-28 ENCOUNTER — Encounter (INDEPENDENT_AMBULATORY_CARE_PROVIDER_SITE_OTHER): Payer: Self-pay | Admitting: Internal Medicine

## 2006-09-28 ENCOUNTER — Ambulatory Visit (INDEPENDENT_AMBULATORY_CARE_PROVIDER_SITE_OTHER): Admitting: Internal Medicine

## 2006-09-28 VITALS — BP 120/60 | HR 88 | Ht 62.0 in | Wt 166.0 lb

## 2006-09-28 MED ORDER — LASIX 40 MG OR TABS
ORAL_TABLET | ORAL | Status: DC
Start: ? — End: 2008-05-15

## 2006-09-28 MED ORDER — FOLIC ACID 400 MCG OR TABS
ORAL_TABLET | ORAL | Status: DC
Start: ? — End: 2017-07-15

## 2006-09-28 MED ORDER — DIGOXIN 0.25 MG OR TABS
ORAL_TABLET | ORAL | Status: DC
Start: ? — End: 2010-12-18

## 2006-09-28 MED ORDER — KLOR-CON 20 MEQ OR PACK
PACK | ORAL | Status: DC
Start: ? — End: 2008-03-02

## 2006-09-28 MED ORDER — ALDACTONE 50 MG OR TABS
ORAL_TABLET | ORAL | Status: DC
Start: ? — End: 2016-05-16

## 2006-09-28 MED ORDER — LASIX 80 MG OR TABS
ORAL_TABLET | ORAL | Status: DC
Start: ? — End: 2008-03-29

## 2006-09-28 MED ORDER — REVATIO 20 MG OR TABS
80.00 mg | ORAL_TABLET | Freq: Three times a day (TID) | ORAL | Status: DC
Start: ? — End: 2010-12-18

## 2006-09-28 MED ORDER — FLOLAN 1.5 MG IV SOLR: INTRAVENOUS | Status: AC

## 2006-09-28 MED ORDER — COUMADIN 5 MG OR TABS
5.00 mg | ORAL_TABLET | Freq: Every day | ORAL | Status: DC
Start: ? — End: 2008-10-09

## 2006-09-30 NOTE — Progress Notes (Signed)
 CLINIC: [facility]    REPORT TYPE: NOTE    Dictating Practitioner: Heath Litten, M.D.    DATE OF SERVICE: 09/28/2006    REASON FOR VISIT: FOLLOWUP      PRINCIPLE DIAGNOSIS: Pulmonary arterial hypertension.    CURRENT MEDICATIONS: Flolan  29 ng/kg/minute, sildenafil  80 mg t.i.d.,  Coumadin  5 mg per day, digoxin  0.25 mg per day, Lasix  80 mg in the morning,  40 mg in the evening, Aldactone  50 mg b.i.d., potassium 60 mEq per day,  folate 400 mcg per day.    HISTORY OF PRESENT ILLNESS: [name] returns for followup of  pulmonary arterial hypertension. She reports that her activity level is  stable and there have been no new changes. She does report having  experienced what sounds like an orbital cellulitis and conjunctivitis in  her right eye that was treated aggressively with oral and topical  antibiotics with resolution. She also describes an evanescent rash with  subcutaneous nodularity, for which she has not sought specific medical  attention. She reports that these nodules are somewhat painful, but that  they resolve spontaneously.    PHYSICAL EXAMINATION: Pulse 88 and regular, blood pressure 120/60, weight  166 pounds, resting room air oxygen saturation 94%. SKIN: A typical  Flolan  rash on the extremities. No other cutaneous abnormalities are  noted, and no nodular lesions are appreciated. LUNGS: Clear. The jugular  venous pulse is flat. CARDIAC: Regular rate and rhythm, with an  accentuated P2. There is a grade 2/6 systolic murmur at the lower left  sternal border. ABDOMEN: Without hepatosplenomegaly or ascites.  EXTREMITIES: No cyanosis, clubbing or edema.    6 minute walk: 511 meters. Oxygen saturation remained at 93-94%.    IMPRESSION  1. Pulmonary arterial hypertension, NYHA functional class IIA, stable.    2. Skin rash with a nodular component that is painful. This is certainly  suggestive of erythema nodosum, although this does sound somewhat  atypical.    PLAN: We will continue her current medical  regimen unchanged. She is  advised to either see me or a dermatologist in the event this rash recurs.                          Signature Derived From Controlled Access Password  Heath Litten, M.D. 09/30/2006 05:36 A        DD: 09/29/2006 DT: 09/29/2006 11:41 A DocNo.: [id number]      Primary Care Physician:  Lyrik Dockstader MD  [facility] [place], [place] [address]    cc:

## 2006-10-10 ENCOUNTER — Emergency Department: Payer: Self-pay

## 2006-10-10 ENCOUNTER — Other Ambulatory Visit (INDEPENDENT_AMBULATORY_CARE_PROVIDER_SITE_OTHER): Payer: Self-pay | Admitting: Emergency Medicine

## 2006-10-13 NOTE — Progress Notes (Signed)
 This office note has been dictated.

## 2006-12-21 ENCOUNTER — Other Ambulatory Visit (INDEPENDENT_AMBULATORY_CARE_PROVIDER_SITE_OTHER): Payer: Self-pay | Admitting: Internal Medicine

## 2006-12-21 ENCOUNTER — Other Ambulatory Visit: Payer: Self-pay

## 2006-12-21 ENCOUNTER — Encounter (HOSPITAL_COMMUNITY): Payer: Self-pay

## 2006-12-21 ENCOUNTER — Ambulatory Visit (INDEPENDENT_AMBULATORY_CARE_PROVIDER_SITE_OTHER): Admitting: Internal Medicine

## 2006-12-21 VITALS — BP 114/72 | HR 98 | Wt 165.0 lb

## 2006-12-29 NOTE — Progress Notes (Signed)
 This office note has been dictated.

## 2006-12-29 NOTE — Progress Notes (Signed)
 CLINIC: [facility]    REPORT TYPE: H&P    Dictating Practitioner: Heath Litten, M.D.    DATE OF SERVICE: 12/21/2006    REASON FOR VISIT: PULMONARY ARTERIAL HYPERTENSION      PRINCIPAL DIAGNOSIS: Pulmonary arterial hypertension.    CURRENT MEDICATIONS: Sildenafil  80 mg t.i.d., Flolan  29 ng/kg per minute,  Coumadin  5 mg daily, digoxin  0.25 mg daily, Lasix  80 mg in the morning and  40 mg in the evening, Aldactone  50 mg b.i.d., potassium 40 mEq in the  morning and 20 mEq in the evening, and folate 400 mcg per day.    HISTORY OF PRESENT ILLNESS: [name] returns for followup of  pulmonary arterial hypertension. She reports that in general she has been  doing quite well, but she seems to develop intermittent episodes of what  sounds like arthritis and/or tenosynovitis. She denies having symptoms  suggestive of podagra.    OBJECTIVE: VITAL SIGNS: Pulse 98 and regular, blood pressure 114/72,  weight 165 pounds. Resting room air oxygen saturation 94%. NECK: The  jugular venous pulse is flat. LUNGS: Clear. Her Hickman site is clean.  CARDIAC: Regular rate and rhythm. The pulmonic component to the second  heart sound is accentuated. There is a grade 2/6 tricuspid regurgitant  murmur. No gallops are appreciated. EXTREMITIES: Have no edema. There  are no tophi or changes of joint deformity or effusion.    A 6-minute walk today was 528 meters. This is stable, compared with prior  studies.    The following additional data were collected. Serum uric acid 10.2, INR  2.6.    IMPRESSION:  1. Pulmonary arterial hypertension, NYHA functional class 2, clinically  stable.  2. 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    PLAN: We will start allopurinol  300 mg per day. She will continue the  remainder of her medical regimen unchanged. I will see her in followup in  3 months.                        Signature Derived From Controlled Access Password  Heath Litten, M.D.  12/29/2006 10:28 A        DD: 12/29/2006 DT: 12/29/2006 09:52 A DocNo.: [id number] [id number]    Referring Physician:  UNLISTED PHYSICIAN        Primary Care Physician:  Vick Gram MD  [address]  [place], [place] [address]    cc:

## 2007-02-15 ENCOUNTER — Ambulatory Visit (INDEPENDENT_AMBULATORY_CARE_PROVIDER_SITE_OTHER): Admitting: Internal Medicine

## 2007-02-15 ENCOUNTER — Other Ambulatory Visit: Payer: Self-pay

## 2007-02-15 VITALS — BP 120/60 | HR 84 | Ht 63.0 in | Wt 162.0 lb

## 2007-02-15 MED ORDER — ALLOPURINOL 300 MG OR TABS
300.00 mg | ORAL_TABLET | Freq: Every day | ORAL | Status: DC
Start: ? — End: 2007-03-22

## 2007-03-02 NOTE — Progress Notes (Signed)
 CLINIC: [facility]    REPORT TYPE: NOTE    Dictating Practitioner: Heath Litten, M.D.    DATE OF SERVICE: 02/15/2007    REASON FOR VISIT: FOLLOWUP      PRINCIPLE DIAGNOSIS: Pulmonary arterial hypertension.    CURRENT MEDICATIONS: Flolan  29 ng/kg/minute, allopurinol  300 mg per day,  Coumadin  5 mg per day, digoxin  0.25 mg per day, Lasix  40 mg in the evening  and 80 mg in the morning, Aldactone  50 mg b.i.d., potassium 40 mEq in the  morning and 20 mEq in the evening, folate 400 mcg per day, sildenafil  80 mg  t.i.d.    HISTORY OF PRESENT ILLNESS: [name] returns for followup of  pulmonary arterial hypertension. She is feeling well and has had no  further episodes of tenosynovitis. She reportedly had a repeat uric acid  level done by her local physician and was told it was in the 5.5 range.    PHYSICAL EXAMINATION: Pulse 84 and regular, blood pressure 120/60, weight  162 pounds, resting room air oxygen saturation 97%. LUNGS: Clear. The  jugular venous pulse is flat. CARDIAC: Regular rate and rhythm, without  murmurs or gallops. The pulmonic component to the second heart sound is  accentuated. EXTREMITIES: No cyanosis, clubbing or edema.    6 minute walk: 522 meters. This is stable compared with prior studies.    IMPRESSION: Stable NYHA functional class 2.    PLAN: She will decrease her Coumadin  to 5 mg alternating with 2.5 mg every  other day. She will continue the remainder of her regimen unchanged. I  will see her in followup in 4-6 months.                        Signature Derived From Controlled Access Password  Heath Litten, M.D. 03/02/2007 07:33 A        DD: 02/17/2007 DT: 02/17/2007 10:45 A DocNo.: [id number]  [email or web address] [id number]    Referring Physician:  UNLISTED PHYSICIAN        Primary Care Physician:  Vick Gram MD  [address]  [place], [place] [address]    cc:

## 2007-03-05 NOTE — Progress Notes (Signed)
 This office note has been dictated.

## 2007-03-22 ENCOUNTER — Other Ambulatory Visit: Payer: Self-pay

## 2007-03-22 ENCOUNTER — Ambulatory Visit (INDEPENDENT_AMBULATORY_CARE_PROVIDER_SITE_OTHER): Admitting: Internal Medicine

## 2007-03-22 ENCOUNTER — Encounter (INDEPENDENT_AMBULATORY_CARE_PROVIDER_SITE_OTHER): Payer: Self-pay | Admitting: Internal Medicine

## 2007-03-22 VITALS — BP 120/70 | HR 93 | Ht 62.0 in | Wt 164.0 lb

## 2007-04-19 NOTE — Progress Notes (Signed)
 CLINIC: [facility]    REPORT TYPE: NOTE    Dictating Practitioner: Heath Litten, M.D.    DATE OF SERVICE: 03/22/2007    REASON FOR VISIT: FOLLOWUP      PRINCIPAL DIAGNOSIS: Pulmonary arterial hypertension.    CURRENT MEDICATIONS: Flolan  29 ng/kg/minute, sildenafil  80 mg t.i.d.,  digoxin  0.25 mg per day, Lasix  80 mg in the morning and 40 mg in the  evening, Aldactone  50 mg b.i.d., potassium 40 mEq in the morning and 20 mEq  in the evening, folate 400 mcg per day, Coumadin  5 mg alternating with 2.5  mg every other day.    HISTORY OF THE PRESENT ILLNESS: Ms. Noguera returns for followup of  pulmonary arterial hypertension. She reports that she experienced a rash  while taking allopurinol  and this resolved upon discontinuation. She has  not had any further gouty episodes. She is otherwise clinically stable  with no new changes in her symptoms.    PHYSICAL EXAMINATION: VITAL SIGNS: Pulse 93 and regular. Blood pressure  128/70. Weight 164 pounds. Resting room air oxygen saturation 94%. NECK:  The jugular venous pulse flat. LUNGS: Clear. CARDIAC: Examination shows  a regular rate and rhythm. The pulmonic component to the second heart  sound is accentuated. EXTREMITIES: No cyanosis, clubbing, or edema.  There are no gouty tophi noted.    LABORATORY STUDIES: Six-minute walk was 527 meters, oxygen saturation  remained at 92-95%. This is stable compared with prior studies.    Recent chemistries from 03/02/07: Glucose 97, BUN 16, creatinine 1.06,  sodium 135, potassium 3.8, chloride 102, CO2 of 20, calcium  9.7, INR of  1.8.    IMPRESSION: Stable [place]  Heart Association functional class 2.    PLAN: We will check her serum uric acid level, and if it is significantly  elevated we may consider a trial of probenecid as an alternative to  xanthine oxidase inhibitor in light of her rash with allopurinol . She will  otherwise continue her medical regimen unchanged and will return for  followup in 4-6  months.                        Signature Derived From Controlled Access Password  Heath Litten, M.D. 04/19/2007 07:26 A        DD: 04/17/2007 DT: 04/17/2007 01:41 P DocNo.: [id number] [id number]    Referring Physician:  Vick Gram MD  [address]  [place] [address]    Primary Care Physician:  Vick Gram MD  [address]  [place], [place] [address]    cc:

## 2007-07-26 ENCOUNTER — Ambulatory Visit (HOSPITAL_COMMUNITY): Payer: Self-pay

## 2007-07-26 ENCOUNTER — Other Ambulatory Visit: Payer: Self-pay

## 2007-07-26 ENCOUNTER — Ambulatory Visit (INDEPENDENT_AMBULATORY_CARE_PROVIDER_SITE_OTHER): Admitting: Internal Medicine

## 2007-07-26 VITALS — BP 110/70 | HR 89 | Ht 62.0 in | Wt 165.0 lb

## 2007-07-26 MED ORDER — LOPERAMIDE HCL 2 MG OR TABS
1.00 mg | ORAL_TABLET | Freq: Two times a day (BID) | ORAL | Status: DC | PRN
Start: ? — End: 2008-10-09

## 2007-08-02 NOTE — Progress Notes (Signed)
 CLINIC: [facility]    REPORT TYPE: NOTE    Dictating Practitioner: Darnelle Catalan, M.D.    DATE OF SERVICE: 07/27/2007    REASON FOR VISIT: FOLLOWUP      PRINCIPAL DIAGNOSIS: Pulmonary arterial hypertension.    CURRENT MEDICATIONS: Flolan 29 ng/kg per minute, sildenafil 80 mg t.i.d.,  Coumadin 5 mg alternating with 2.5 mg every other day, digoxin 0.25 mg per  day, Lasix 80 mg in the morning and 40 mg in the evening, Aldactone 50 mg  b.i.d., potassium 40 mEq in the morning and 20 mEq in the evening, folate  400 mcg per day, Imodium p.r.n.    HISTORY OF THE PRESENT ILLNESS: Diana Chambers returns for followup of  pulmonary arterial hypertension. She reports occasional palpitations that  appear to be most prominent in the hot climate in [place]. This has not  occurred over the past month or two, however. She has no other new  complaints. Her activity tolerance remains good and unchanged.    PHYSICAL EXAMINATION: VITAL SIGNS: Pulse 89 and regular, blood pressure  110/70, weight 165 pounds, resting room air oxygen saturation 93%. The  jugular venous pulse is flat. LUNGS: Clear. CARDIAC: Regular rate and  rhythm, with an accentuated P2. EXTREMITIES: No edema.    STUDIES: Six-minute walk: 535 m. Oxygen saturation declined from 96% to  90%. This is stable compared with prior studies.    LABORATORY DATA: Her most recent laboratory studies from 07/22/2007: INR  1.6. Glucose 96, BUN 21, creatinine 1.2, sodium 138, potassium 4.2,  chloride 102, CO2 24. Albumin 4.1, calcium 9.4, alkaline phosphatase 85,  ALT 10, AST 15, total bilirubin 0.4.    IMPRESSION: Stable NYHA functional class II.    PLAN: I have encouraged her to discontinue caffeine, since this may be  contributing to palpitations. She will continue the remainder of her  regimen unchanged. We will get a Holter monitor recording if her  palpitations recur. Otherwise, I will see her in followup in 4 months.                        Electronically signed by:  Darnelle Catalan, M.D. 08/02/2007 10:54 A        DD: 07/27/2007 DT: 07/27/2007 11:30 A DocNo.: [id number]    Referring Physician:  Henreitta Leber MD  [address]  [place] [address]    Primary Care Physician:  Henreitta Leber MD  [address]  [place], [place] [address]    cc:

## 2007-08-03 NOTE — Progress Notes (Signed)
 This office note has been dictated.

## 2007-11-29 ENCOUNTER — Other Ambulatory Visit: Payer: Self-pay

## 2007-11-29 ENCOUNTER — Ambulatory Visit (INDEPENDENT_AMBULATORY_CARE_PROVIDER_SITE_OTHER): Admitting: Internal Medicine

## 2007-11-29 ENCOUNTER — Ambulatory Visit (HOSPITAL_COMMUNITY)

## 2007-11-29 VITALS — BP 123/70 | HR 90 | Ht 62.0 in | Wt 168.0 lb

## 2007-11-30 NOTE — Progress Notes (Signed)
CLINIC: [facility]      REPORT TYPE: NOTE      Dictating Practitioner: Darnelle Catalan, M.D.    DATE OF SERVICE: 11/29/2007    REASON FOR VISIT: FOLLOWUP          PRINCIPAL DIAGNOSIS: Pulmonary arterial hypertension.    CURRENT MEDICATIONS  1. Flolan 29 ng/kg per minute.  2. Sildenafil 80 mg t.i.d.  3. Coumadin 5 mg alternating with 2.5 mg every other day.  4. Digoxin 0.25 mg per day.  5. Lasix 80 mg in the morning, 40 mg in the evening.  6. Aldactone 50 mg b.i.d.  7. Potassium 60 mEq per day.  8. Folate 400 mcg per day.  9. Imodium p.r.n.    HISTORY OF PRESENT ILLNESS: [name] returns for followup of  pulmonary arterial hypertension. She is feeling well and has no new  complaints. Her activity level continues to be quite good.    PHYSICAL EXAMINATION: VITAL SIGNS: Pulse rate 90 and regular, blood  pressure 123/70, weight 168 pounds, resting room air oxygen saturation 92%.  NECK: Jugular venous pulse is flat. LUNGS: Clear. CARDIAC: Regular  rate and rhythm. There is a right-sided S4 gallop and an accentuated P2.  There is a grade 2/6 tricuspid regurgitant murmur. EXTREMITIES: No  clubbing, cyanosis or edema.    INVESTIGATIONS: Six-minute walk: 540 meters. Oxygen saturation remained  at 91% to 92%.    LABORATORY STUDIES: Most recent laboratory studies from November 22, 2007:  INR 1.4, glucose 85, creatinine 1.2, BUN 30, sodium 137, potassium 4.1,  chloride 103, CO2 24, total protein 8.3, albumin 4.2, calcium 9.9, alkaline  phosphatase 88, ALT 11, AST 15, total bilirubin 0.4.    IMPRESSION: Stable [place] Heart Association functional class II-A.    PLAN: We will continue her current medical regimen unchanged. I will plan  on seeing her in 4 to 6 months.                        Electronically signed by:  Darnelle Catalan, M.D. 11/30/2007 01:28 P        DD: 11/30/2007 DT: 11/30/2007 10:52 A DocNo.: [id number]    Referring Physician:  Henreitta Leber MD  [address]  [place]  [address]    Primary Care Physician:  Henreitta Leber MD  [address] [place], [place] [address]    cc:

## 2008-01-05 NOTE — Progress Notes (Signed)
 This office note has been dictated.

## 2008-03-02 ENCOUNTER — Other Ambulatory Visit (INDEPENDENT_AMBULATORY_CARE_PROVIDER_SITE_OTHER): Payer: Self-pay | Admitting: Internal Medicine

## 2008-03-02 MED ORDER — POTASSIUM CHLORIDE 20 MEQ OR PACK
20.0000 meq | PACK | Freq: Three times a day (TID) | ORAL | Status: DC
Start: 2008-03-02 — End: 2008-03-03

## 2008-03-03 ENCOUNTER — Other Ambulatory Visit (INDEPENDENT_AMBULATORY_CARE_PROVIDER_SITE_OTHER): Payer: Self-pay | Admitting: Internal Medicine

## 2008-03-03 MED ORDER — POTASSIUM CHLORIDE CRYS CR 20 MEQ OR TBCR
20.0000 meq | EXTENDED_RELEASE_TABLET | Freq: Three times a day (TID) | ORAL | Status: DC
Start: 2008-03-03 — End: 2010-12-18

## 2008-03-29 ENCOUNTER — Other Ambulatory Visit (INDEPENDENT_AMBULATORY_CARE_PROVIDER_SITE_OTHER): Payer: Self-pay | Admitting: Internal Medicine

## 2008-03-29 MED ORDER — FUROSEMIDE 80 MG OR TABS
120.0000 mg | ORAL_TABLET | Freq: Every day | ORAL | Status: DC
Start: 2008-03-29 — End: 2010-12-18

## 2008-05-15 ENCOUNTER — Other Ambulatory Visit: Payer: Self-pay

## 2008-05-15 ENCOUNTER — Ambulatory Visit (HOSPITAL_COMMUNITY)

## 2008-05-15 ENCOUNTER — Ambulatory Visit (INDEPENDENT_AMBULATORY_CARE_PROVIDER_SITE_OTHER): Admitting: Internal Medicine

## 2008-05-15 VITALS — BP 121/70 | Ht 63.0 in | Wt 172.0 lb

## 2008-05-15 MED ORDER — ESTRADIOL 0.1 % TD GEL
TRANSDERMAL | Status: DC
Start: ? — End: 2011-06-24

## 2008-05-17 NOTE — Progress Notes (Signed)
 CLINIC: [facility]      REPORT TYPE: NOTE      Dictating Practitioner: Darnelle Catalan, M.D.    DATE OF SERVICE: 05/15/2008    REASON FOR VISIT: FOLLOWUP          PRINCIPLE DIAGNOSIS: Pulmonary arterial hypertension.    CURRENT MEDICATIONS: Flolan 29 ng/kg/minute, sildenafil 80 mg 3 times  daily, Coumadin 5 mg alternating with 2.5 mg every other day, digoxin 0.25  mg per day, Aldactone 50 mg twice daily, folate 0.4 mg per day, potassium  20 mEq t.i.d., Lasix 80 mg in the morning and 40 mg in the evening.    HISTORY OF PRESENT ILLNESS: Diana Chambers returns for followup of  pulmonary arterial hypertension. She remains quite stable and is  physically active. She has no new complaints.    PHYSICAL EXAMINATION: Pulse 90 and regular, blood pressure 121/70, weight  172 pounds, resting room air oxygen saturation 94%. The jugular venous  pulse is flat. LUNGS: Clear. CARDIAC: Regular rate and rhythm. The  pulmonic component to the second heart sound is accentuated. There is a  grade 2/6 systolic murmur audible at the lower left sternal border  radiating to the left second intercostal space. EXTREMITIES: 1+ clubbing.  No cyanosis or edema.    6 minute walk: 510 meters, oxygen saturation declined from 97% to 90%.  This is stable compared with prior studies.    LABORATORY DATA: Her most recent laboratory studies were reviewed from  05/09/2008: Electrolytes were all unremarkable, as were liver function  studies. The INR was 1.3.    IMPRESSION: Clinically stable NYHA functional class IIA.    PLAN: She will continue her current medical regimen unchanged. She will  be seen in followup in 6 months.                        Electronically signed by:  Darnelle Catalan, M.D. 05/17/2008 06:42 A        DD: 05/16/2008 DT: 05/16/2008 02:27 P DocNo.: [id number]    Referring Physician:  Henreitta Leber MD  [address]  [place] [address]    Primary Care Physician:  Henreitta Leber MD  [address] [place], [place]  [address]    cc:

## 2008-05-22 NOTE — Progress Notes (Signed)
 This office note has been dictated.

## 2008-09-08 ENCOUNTER — Inpatient Hospital Stay
Admission: EM | Admit: 2008-09-08 | Discharge: 2008-09-12 | Disposition: A | Discharge status: Home Health | Payer: Self-pay | Attending: Pulmonary Disease | Admitting: Pulmonary Disease

## 2008-09-08 ENCOUNTER — Other Ambulatory Visit (HOSPITAL_BASED_OUTPATIENT_CLINIC_OR_DEPARTMENT_OTHER): Payer: Self-pay | Admitting: Emergency Medicine

## 2008-09-08 LAB — URINALYSIS
Bilirubin: NEGATIVE
Blood: NEGATIVE
Glucose: NEGATIVE
Ketones: NEGATIVE
Leuk Esterase: NEGATIVE
Nitrite: NEGATIVE
Protein: NEGATIVE
Specific Gravity: 1.004 (ref 1.002–1.030)
pH: 5 (ref 5.0–8.0)

## 2008-09-08 LAB — CBC WITH DIFF, BLOOD
Basophils: 0 % (ref 0–2)
Eosinophils: 1 % (ref 1–3)
Hct: 38.5 % (ref 36.0–46.0)
Hgb: 12.8 gm/dL (ref 12.0–16.0)
Lymphocytes: 14 % — ABNORMAL LOW (ref 20–40)
MCH: 25.8 pg — ABNORMAL LOW (ref 27–31)
MCHC: 33.2 % (ref 32–37)
MCV: 77.6 um3 — ABNORMAL LOW (ref 82.0–98.0)
MPV: 9 fl (ref 7.4–10.4)
Monocytes: 8 % (ref 1–10)
Plt Count: 139 10*3/uL (ref 130–400)
RBC: 4.96 10*6/uL (ref 4.00–5.00)
RDW: 18.8 % — ABNORMAL HIGH (ref 10–15)
Segs: 77 % — ABNORMAL HIGH (ref 45–70)
WBC: 8.1 10*3/uL (ref 4.0–11.0)

## 2008-09-08 LAB — PROTHROMBIN TIME, BLOOD
INR: 5.1
PT,Patient: 56.6 s — ABNORMAL HIGH (ref 9.7–12.5)

## 2008-09-08 LAB — COMPREHENSIVE METABOLIC PANEL, BLOOD
ALT (SGPT): 9 U/L
AST (SGOT): 11 U/L
Albumin: 3.8 gm/dL (ref 3.5–5.2)
Alkaline Phos: 87 U/L
BUN: 19 mg/dL (ref 6–20)
Bicarbonate: 21 mmol/L — ABNORMAL LOW (ref 22–29)
Bilirubin, Tot: 0.4 mg/dL (ref <1.2)
Calcium: 10.7 mg/dL — ABNORMAL HIGH (ref 8.6–10.5)
Chloride: 100 mmol (ref 98–107)
Creatinine: 1.12 mg/dL — ABNORMAL HIGH (ref 0.51–0.95)
GFR (African Amer.): 60 mL/min
GFR: 50 mL/min
Glucose: 116 mg/dL — ABNORMAL HIGH (ref ?–115)
Potassium: 3.8 mmol/L (ref 3.5–5.1)
Sodium: 131 mmol/L — ABNORMAL LOW (ref 136–145)
Total Protein: 8 gm/dL (ref ?–8.0)

## 2008-09-08 LAB — CKMB+INDEX (NO CPK), BLOOD
CK-MB Index: INVALID % (ref <2.5)
CK-MB: 1.3 ng/mL

## 2008-09-08 LAB — PRO BNP, BLOOD: BNPP: 1657 pg/mL — ABNORMAL HIGH (ref <125)

## 2008-09-08 LAB — ADIF: Plt Est: NORMAL

## 2008-09-08 LAB — TROPONIN T, BLOOD: Troponin T: <0.01 ng/mL (ref <0.03)

## 2008-09-08 LAB — CPK-CREATINE PHOSPHOKINASE, BLOOD: CPK: 24 IU/L (ref 0–175)

## 2008-09-08 LAB — APTT, BLOOD: PTT: 43.9 s — ABNORMAL HIGH (ref 25.0–34.0)

## 2008-09-08 LAB — DIGOXIN, BLOOD: Digoxin: 1.8 ng/mL (ref 0.9–2.0)

## 2008-09-08 MED ORDER — POTASSIUM CHLORIDE CRYS CR 20 MEQ OR TBCR
20.0000 meq | EXTENDED_RELEASE_TABLET | Freq: Three times a day (TID) | ORAL | Status: DC
Start: 2008-09-08 — End: 2008-09-12

## 2008-09-08 MED ORDER — FOLIC ACID 400 MCG OR TABS
0.4000 mg | ORAL_TABLET | Freq: Every day | ORAL | Status: DC
Start: 2008-09-08 — End: 2008-09-12

## 2008-09-08 MED ORDER — SPIRONOLACTONE 50 MG OR TABS
50.0000 mg | ORAL_TABLET | Freq: Two times a day (BID) | ORAL | Status: DC
Start: 2008-09-08 — End: 2008-09-12

## 2008-09-08 MED ORDER — DIGOXIN 0.25 MG OR TABS
250.0000 ug | ORAL_TABLET | Freq: Every day | ORAL | Status: DC
Start: 2008-09-08 — End: 2008-09-12

## 2008-09-08 MED ORDER — EPOPROSTENOL SODIUM 1.5 MG IV SOLR
29.0000 | INTRAVENOUS | Status: DC
Start: 2008-09-08 — End: 2008-09-12

## 2008-09-08 MED ORDER — FUROSEMIDE 80 MG OR TABS
120.0000 mg | ORAL_TABLET | Freq: Every day | ORAL | Status: DC
Start: 2008-09-08 — End: 2008-09-12

## 2008-09-08 MED ORDER — ESTRADIOL 0.1 % TD GEL
0.1000 mg | Freq: Every day | TRANSDERMAL | Status: DC
Start: 2008-09-08 — End: 2008-09-12

## 2008-09-08 MED ORDER — SILDENAFIL CITRATE 20 MG OR TABS
80.0000 mg | ORAL_TABLET | Freq: Three times a day (TID) | ORAL | Status: DC
Start: 2008-09-08 — End: 2008-09-12

## 2008-09-08 NOTE — ED Notes (Addendum)
 ============================== ADMIT SUMMARY ==============================    RECEIVING NURSE - [facility]  ED NURSE -     +------------------------------- ALLERGIES -------------------------------+   QUINOLONES-GI Intolerance (10/10/2006); Allopurinol-Unknown Reaction   (09/08/2008); Sulfonamides-All other allergies: Food, Unknown,   miscellaneous (dust, dye, etc.) (09/08/2008);     +-------------------------- ADMITTING DIAGNOSIS --------------------------+   pneumonia    +--------------------------- ADMITTING SERVICE ---------------------------+  ADMISSION SERVICE - PULMONARY/TH  LEVEL OF CARE - Tele  ATTENDING - TEST, VICTOR  RESIDENT - TEST, VICTOR    +------------------------ MOST RECENT VITAL SIGNS ------------------------+  BP - 122/66 PULSE - 84   RESPIRATIONS - 20 O2 SAT - 98   TEMPERATURE - MODE -   GCS TOTAL -   PAIN - 3 PAIN QUALITY - Intermittent   PAIN LOCATION - RUQ   DATE/TIME - 09/08/2008 1227    +-------------------------------- FLUIDS ---------------------------------+  DATE TIME IV FLUID L/R LOCATION SIZE HUNG ABSORBED  ---------------------------------------------------------------------------     TOTAL IV: 0 ml    TOTAL OUTPUT: 0 ml TOTAL PO: 0 ml    +------------------------------ MEDICATIONS ------------------------------+  DATE TIME MEDICATION VERIFYING RN RN INIT  ---------------------------------------------------------------------------    08/27 1020 Ceftriaxone Sodium 1000 MG bs    IV-(INFUSION)  08/27 1155 Azithromycin 500 MG IVPB bs   08/27 1340 Vancomycin HCl 1000 MG IV in NS bs     +------------------------------- LABS DONE -------------------------------+  ACT- MD MD RN AP +INITIALS+  IVE DATE TIME TIME TIME TREATMENT ORDERS MD RN AP   ---------------------------------------------------------------------------     08/27 0823 0827 1010 CBC WITH DIFF** MCD bs vap   Collect New Specimen   Specimen Type: Blood   08/27 0823 0827 1010 COMP METABOLIC PANEL** MCD bs vap   Collect New  Specimen   Specimen Type: Blood   08/27 0823 0827 1010 TROPONIN T, CK TOTAL, CK-MB MCD bs vap   Collect New Specimen   Specimen Type: Blood   08/27 0823 0827 1010 PT**, PTT** MCD bs vap   Collect New Specimen   Specimen Type: Blood   08/27 0823 0827 1010 BNP MCD bs vap   Collect New Specimen   Specimen Type: Blood   08/27 0849 0851 1010 CULTURE, BLOOD MCD bs vap   Collect New Specimen   Specimen Type: Blood   Specimen Site: RAC   08/27 0849 0852 1010 CULTURE, BLOOD MCD bs vap   Collect New Specimen   Specimen Type: Blood   Specimen Site: RAC    EKG DONE - NO    +---------------------------- PROCEDURE NOTES ----------------------------+      +------------------------ CURRENT MEDICATION --------------------------+     MedroxyPROGESTERone Acetate 0.5 MG QD for    Divigel MG/0.25GM QHS for    Vitamin D3 2 UNIT QD for    Lasix MG QD for    LASIX 80 Milligrams QD for Continuous   POTASSIUM CHLORIDE 20 Milli-Equivalents TID for Continuous   ALDACTONE 50 Milliliters BID for Continuous   DIGOXIN 0.25 Milligrams QD for Continuous   COUMADIN 5 Milligrams QD for Continuous   LASIX 40 Milligrams QHS for Continuous   FOLIC ACID 400 Micrograms QD for Continuous   VIAGRA 80 Milligrams TID for Continuous   FLOLAN 80 Milliliters QD for Continuous  +------------------------ OTHER NURSING PROCEDURES -----------------------+     +--------------------------- PSYCHOSOCIAL NEEDS --------------------------+  +------------------------- BARRIERS TO LEARNING --------------------------+    ASSESSMENT- Assessment Done with Findings of:      BARRIERS-    No Barriers  SUPPORT PERSON-  SPECIAL CONSIDERATIONS-      ============================== TRIAGE RECORD ==============================    CHIEF COMPLAINT- Fever    TIME OF ONSET- 1week : +-STANDING-+ +--SEATED--+  TRIAGE CATEGORY- 2 : BP PULSE BP PULSE   ROOM- T2 : N/A/N/A N/A 139/64 90   MODE OF ARRIVAL- Car :   IN CUSTODY- No : TEMP MODE O2SAT RESP LMP   PRIVATE MD- N/A : 99.0 Oral  93 28 N/A       :   WORK RELATED INJURY- No : +--GCS--+ +--PUPILS--+  RETURN IN 72 HOURS- None : E V M TOT L R RESPONSE  TRIAGE NURSE- Alivia Munson : X X X N/A X X N/A     IS THIS VISIT RELATED TO ASSAULT OR DOMESTIC VIOLENCE- no   PAIN TYPE- V NOW- 6 TOLERABLE AT- 4 QUALITY- Intermittent      PAIN LOCATION- right low chest/ upper abd RADIATES TO- no      LATEX ALLERGY FORM- N/A LATEX ALLERGY- N/A TETANUS- N/A   IMMUNIZATION- N/A PED HEIGHT- N/A WEIGHT- N/A KG  ADDITIONAL FORMS- N/A  +------------------------- CARE PRIOR TO ARRIVAL -------------------------+   none, tylenol yesterday am  +------------------------------- ALLERGIES -------------------------------+    MEDICATION ALLERGY REACTION  ---------------------------------------------------------------------------    QUINOLONES GI Intolerance GI Intolera  Allopurinol Unknown reaction Unknown Rea  Sulfonamides All other allergies: Food, Unknown, Misc All other a    +------------------------------ MEDICATIONS ------------------------------+    MEDICATION NAME DOSAGE FREQUENCY  ---------------------------------------------------------------------------    FOLIC ACID 400 Micrograms Once a Day   DIGOXIN 0.25 Milligrams Once a Day   LASIX 80 Milligrams Once a Day   Divigel MG/0.25GM Every Night   ALDACTONE 50 Milliliters Every 12 Hours  Lasix MG Once a Day   FLOLAN 80 Milliliters Once a Day   LASIX 40 Milligrams Every Night   VIAGRA 80 Milligrams Every 8 Hours   COUMADIN 5 Milligrams Once a Day   Vitamin D3 2 UNIT Once a Day   MedroxyPROGESTERone Acetate 0.5 MG Once a Day   POTASSIUM CHLORIDE 20 Milli-Equivalents Every 8 Hours     +-------------------------- PAST MEDICAL HISTORY -------------------------+     +---------------------------- CURRENT HISTORY ----------------------------+   flolan pt   c/o fever x 1week   now with right sided chest/ upper abd pain that is aggravated by deep   inspiration and positioning. also c/o sore  throat.    =========================== REASSESSMENT VITALS ===========================   R T M ET    E E O CO2 PU-    S M D O2 ET TY- PIL +---GCS----+   DATE TIME BP HR P P E SAT CO2 PE L R E V M TOT POSITION      ---------------------------------------------------------------------------    08/27 0816 139/64 90 28 99.0 O 93 Seated   08/27 0816 / Standing  08/27 1037 123/68 80 16 98.1 O 95 Lying   08/27 1227 122/66 84 20 98 Seated      +-----------------PAIN-----------------+   T I    Y N T N    P O O I   DATE TIME E W L LOCATION QUALITY RADIATES COMMENT T   ---------------------------------------------------------------------------    08/27 0816 V 6 4 right low Intermittent no Triage AM   08/27 0816 Triage AM   08/27 1037 tj   08/27 1227 V 3 4 RUQ Intermittent none bs     ============================= NURSE DISCHARGE =============================    DISCHARGE NURSE- Tylene Fantasia   DISPOSITION- Admitted to Hospital WITH- Family  ACCOMPANIED BY- RN, ED Tech   EQUIP W/TRANSPORT-    Monitor with defibrilator and pads, Airway / ALS Equip   TIME OF DISPOSITION- 09/08/2008 1335 LEFT ED VIA- Jonni Sanger   TRANSFERRED TO- N/A REASON- N/A   ADMITTED TO- [address] ROOM- 265      NURSE REPORT TO- Tracy REPORT TIME- 08/27 1340      BELONGINGS- To Floor ENVELOPE NUMBER- N/A   CONDITION ON DISCHARGE- Improving   AFTERCARE PROVIDED WITH- Written and Verbal   WHAT AFTERCARE INSTRUCTIONS WERE GIVEN AND REVIEWED WITH PATIENT  AND/OR FAMILY?- (see EPIC instructions)   admit   IN WHAT LANGUAGE WERE THESE GIVEN?- English OTHER: N/A   TRANSLATED BY- N/A OTHER: N/A   GCS- E: 4 V: 5 M: 5 TOTAL: 14   PAIN LEVEL UPON DISPOSITION- 3 OUT OF 10  WHAT MEDS WERE PROVIDED FROM DISCHARGE PYXIS?-    N/A   RX TO BE FILLED FOR- N/A   DID THE PATIENT OR RESPONSIBLE CARE PROVIDER UNDERSTAND THE FOLLOW UP  RECOMMENDATION?- Yes DISPOSITION BY- RN   NURSING LEVEL- 3. ED Stay with Multiple Interactions     +------------------------------- RESTRAINTS  ------------------------------+  ALTERNATIVE ATTEMPTS:    -------------------------- RESTRAINT ASSESSMENTS --------------------------  ============================== POINT OF CARE ==============================    OCCULT BLOOD STOOL RESULTS   Norm results neg.  DATE TIME RESULTS DONE BY CONTROL POSTIVE CONTROL NEGATIVE     URINE PREGNANCY TEST   Norm results for non pregnant females neg.  DATE TIME RESULTS DONE BY CONTROL POSTIVE     URINE DIP   Norm results - All neg. with pH 5.0 to 8.0 and urobili 0.2 to 1.0   LEUKO NI- PRO- GLU- URO-   DATE TIME CYTE TRITE PH TEIN COSE KETONES BILI BILI BLOOD BY

## 2008-09-08 NOTE — ED Notes (Addendum)
==============================   ATTENDING NOTE =============================    08/27 1258 Delrae Rend, MD Attending     Pt is a 58 yo F with a PMH of pulm HTN on flolan who   presents with a CC of fever, chills and rigors for the last   week. Her fever has been intermittent to 102F. She also   has severe cough and SOB. She denies CP. She denies other   complaints.      ROS: denies HA/neck pain/CP/nausea/vomiting/extremity   pain/back pain/weakness/numbness       PE: VSS    Gen: awake, A and O x 3, talking, interactive, smiling    HEENT: PERLB, EOMI, neck supple, no oral lesions    Lungs: B basilar rales, L>>R   CV: RRR, no M/G/R    Abd: soft, no ttp, no G/R    Back: non-tender, no CVA ttp    Ext: atraumatic, intact B DP and PT pulses    Neuro: CN II-XII grossly nml, no motor or sensory deficits,   non-focal    Skin: no ecchymosis, no rashes      A/P: PNA vs CHF as the source of her sxs; suspect PNA given   fever and cough; no signs of ACS with neg trop but with   slight ST depression on EKG in inf leads, will need full   r/o; case discussed with pulm fellow, who admitted pt in   stable condition

## 2008-09-08 NOTE — ED Notes (Addendum)
 FINGER STICK GLUCOSE   Norm results 65 to 110 mg/dl  DATE TIME RESULTS DONE BY     FINGER STICK HEMOGLOBIN   Norm results adult female 48 to 17 gm/dl  Norm results adult female 23 to 16 gm/dl  DATE TIME RESULTS DONE BY     ============================== MD NOTES H&P ===============================    TIME OF NOTE WRITTEN- N/A   CHIEF COMPLAINT- N/A     HISTORY OF PRESENT ILLNESS   N/A     PAST MEDICAL/SURGICAL HISTORY   N/A     FAMILY HISTORY- N/A   SOCIAL HISTORY-   SMOKING ALCOHOL ILLICIT DRUGS HOMELESS MARRIED EMPLOYED   N/A N/A N/A N/A N/A N/A  OTHER- N/A   REVIEW OF SYSTEMS- N/A     PHYSICAL EXAM   N/A     IMPRESSION   N/A     MEDICAL DECISION MAKING  CASE PRESENTED TO- N/A     ============================= PHYSICIAN NOTES =============================    08/27 1258 Megan DeMott, MD Attending     EKG: NSR at 87 bpm, nml axis, ST depression, 1 box in inf   leads, slightly seen on old      ============================= PROCEDURE NOTES =============================      ================================ LAB NOTES ================================      ================================= IMAGES ==================================      ============================== MD DISCHARGE ===============================    DISCHARGE PHYSICIAN- Megan DeMott   CHIEF COMPLAINT- N/A CASE PRESENTED TO- Megan DeMott   CONDITION OF DISCHARGE- Stable   WAS THIS VISIT FOR A WORK RELATED ILLNESS OR INJURY- No   PRIMARY CARE PHYSICIAN- RUBIN [place] MD   HAS PCP BEEN CONTACTED- N/A H&P NOTE WAS DICTATED- No     +-------------------------- DISCHARGE DIAGNOSIS --------------------------+   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    275.42 HYPERCALCEMIA    276.1 HYPOSMOLALITY    780.60 FEVER    790.2 ABNORMAL GLUCOSE    790.6 OTH ABNORMAL BLOOD CHEMISTRY    790.92 ABNORMAL COAGULATION PROFILE     WRITE IN DIAGNOSIS-    N/A pneumonia     +------------------------- DISCHARGE INSTRUCTIONS ------------------------+      +----------------------- MEDICATION  RECONCILIATION -----------------------+    ---------------------------- CURRENT MEDICATION ---------------------------  MEDICATION NAME DOSAGE FREQUENCY  ---------------------------------------------------------------------------    FOLIC ACID 400 Micrograms Once a Day   DIGOXIN 0.25 Milligrams Once a Day   LASIX 80 Milligrams Once a Day   Divigel MG/0.25GM Every Night   ALDACTONE 50 Milliliters Every 12 Hours  Lasix MG Once a Day   FLOLAN 80 Milliliters Once a Day   LASIX 40 Milligrams Every Night   VIAGRA 80 Milligrams Every 8 Hours   COUMADIN 5 Milligrams Once a Day   Vitamin D3 2 UNIT Once a Day   MedroxyPROGESTERone Acetate 0.5 MG Once a Day   POTASSIUM CHLORIDE 20 Milli-Equivalents Every 8 Hours     ---------------------------- STOPPED MEDICATION ---------------------------  MEDICATION NAME DOSAGE FREQUENCY  ---------------------------------------------------------------------------      ---------------------------- UPDATED MEDICATION ---------------------------  MEDICATION NAME DOSAGE FREQUENCY  ---------------------------------------------------------------------------      ----------------------------- ADDED MEDICATION ----------------------------  MEDICATION NAME DOSAGE FREQUENCY  ---------------------------------------------------------------------------        ============================= FOLLOW UP NOTES =============================

## 2008-09-08 NOTE — ED Notes (Addendum)
=================================   ORDERS ==================================    ACT- MD MD RN AP   IVE DATE TIME TIME TIME TREATMENT ORDERS MD RN   ---------------------------------------------------------------------------     08/27 0823 0827 1010 CBC WITH DIFF** MCD bs    Collect New Specimen   Specimen Type: Blood   08/27 0823 0827 1010 COMP METABOLIC PANEL** MCD bs    Collect New Specimen   Specimen Type: Blood   08/27 0823 0827 1010 TROPONIN T, CK TOTAL, CK-MB MCD bs    Collect New Specimen   Specimen Type: Blood   08/27 0823 0827 1010 PT**, PTT** MCD bs    Collect New Specimen   Specimen Type: Blood   08/27 0823 0827 1010 BNP MCD bs    Collect New Specimen   Specimen Type: Blood   08/27 0823 0827 1010 X-RAY CHEST 1 VIEW (PA) MCD bs    Rad Indication: sob   08/27 0849 0851 1010 CULTURE, BLOOD MCD bs    Collect New Specimen   Specimen Type: Blood   Specimen Site: RAC   08/27 0849 0852 1010 CULTURE, BLOOD MCD bs    Collect New Specimen   Specimen Type: Blood   Specimen Site: [facility]   08/27 0942 0949 1011 Ceftriaxone Sodium 1000 MG MCD bs    IV-(INFUSION)   08/27 0942 0949 1011 Azithromycin 500 MG IVPB MCD bs    08/27 1031 1037 1033 Admit -- PULMONARY/TH MCD bs     Level of Care -- Tele   08/27 1159 1207 1203 PULMONARY MEDICINE Service - TELE P bs    Level of care.   08/27 1220 1222 1222 US ABDOMEN COMPLETE WE bs    Rad Indication: RUQ pain and fever   08/27 1324 1325 1326 Vancomycin HCl 1000 MG IV - WE bs    Intravenous

## 2008-09-09 LAB — ADIF: Plt Est: NORMAL

## 2008-09-09 LAB — ECG, COMPLETE (HC/~~LOC~~/ENCINITAS)
ATRIAL RATE: 87 {beats}/min
ECG INTERPRETATION: NORMAL
P AXIS: 45 degrees
PR INTERVAL: 202 ms
QRS INTERVAL/DURATION: 84 ms
QT: 314 ms
QTc (Bazett): 377 ms
R AXIS: 110 degrees
T AXIS: 18 degrees
VENTRICULAR RATE: 87 {beats}/min

## 2008-09-09 LAB — CBC WITH DIFF, BLOOD
Basophils: 1 % (ref 0–2)
Eosinophils: 1 % (ref 1–3)
Hct: 38.1 % (ref 36.0–46.0)
Hgb: 12.7 g/dL (ref 12.0–16.0)
Lymphocytes: 18 % — ABNORMAL LOW (ref 20–40)
MCH: 25.9 pg — ABNORMAL LOW (ref 27–31)
MCHC: 33.3 % (ref 32–37)
MCV: 77.8 um3 — ABNORMAL LOW (ref 82.0–98.0)
MPV: 9.8 fL (ref 7.4–10.4)
Monocytes: 8 % (ref 1–10)
Plt Count: 162 10*3/uL (ref 130–400)
RBC: 4.9 10*6/uL (ref 4.00–5.00)
RDW: 17.8 % — ABNORMAL HIGH (ref 10–15)
Segs: 73 % — ABNORMAL HIGH (ref 45–70)
WBC: 8.7 10*3/uL (ref 4.0–11.0)

## 2008-09-09 LAB — COMPREHENSIVE METABOLIC PANEL, BLOOD
ALT (SGPT): 9 U/L
AST (SGOT): 11 U/L
Albumin: 3.6 g/dL (ref 3.5–5.2)
Alkaline Phos: 87 U/L
BUN: 21 mg/dL — ABNORMAL HIGH (ref 6–20)
Bicarbonate: 21 mmol/L — ABNORMAL LOW (ref 22–29)
Bilirubin, Tot: 0.4 mg/dL (ref <1.2)
Calcium: 10.6 mg/dL — ABNORMAL HIGH (ref 8.6–10.5)
Chloride: 102 mmol (ref 98–107)
Creatinine: 1.12 mg/dL — ABNORMAL HIGH (ref 0.51–0.95)
GFR (African Amer.): 60 mL/min
GFR: 50 mL/min
Glucose: 99 mg/dL (ref ?–115)
Potassium: 4.1 mmol/L (ref 3.5–5.1)
Sodium: 133 mmol/L — ABNORMAL LOW (ref 136–145)
Total Protein: 7.7 g/dL (ref ?–8.0)

## 2008-09-09 LAB — APTT, BLOOD: PTT: 38.1 s — ABNORMAL HIGH (ref 25.0–34.0)

## 2008-09-09 LAB — PROTHROMBIN TIME, BLOOD
INR: 4.6
PT,Patient: 51.6 s — ABNORMAL HIGH (ref 9.7–12.5)

## 2008-09-09 LAB — LIVER PANEL, BLOOD: Bilirubin, Dir: 0.1 mg/dL (ref <0.2)

## 2008-09-10 LAB — BASIC METABOLIC PANEL, BLOOD
BUN: 22 mg/dL — ABNORMAL HIGH (ref 6–20)
Bicarbonate: 22 mmol/L (ref 22–29)
Calcium: 10.2 mg/dL (ref 8.6–10.5)
Chloride: 100 mmol (ref 98–107)
Creatinine: 1.07 mg/dL — ABNORMAL HIGH (ref 0.51–0.95)
GFR (African Amer.): >60 mL/min
GFR: 53 mL/min
Glucose: 99 mg/dL (ref ?–115)
Potassium: 3.9 mmol/L (ref 3.5–5.1)
Sodium: 133 mmol/L — ABNORMAL LOW (ref 136–145)

## 2008-09-10 LAB — PROTHROMBIN TIME, BLOOD
INR: 2.9
PT,Patient: 32.5 s — ABNORMAL HIGH (ref 9.7–12.5)

## 2008-09-10 LAB — URINE CULTURE

## 2008-09-11 LAB — VANCOMYCIN, TROUGH: Vancomycin, Trough: 16 ug/mL — ABNORMAL HIGH (ref ?–10.0)

## 2008-09-11 LAB — BASIC METABOLIC PANEL, BLOOD
BUN: 24 mg/dL — ABNORMAL HIGH (ref 6–20)
Bicarbonate: 22 mmol/L (ref 22–29)
Calcium: 10.2 mg/dL (ref 8.6–10.5)
Chloride: 101 mmol (ref 98–107)
Creatinine: 1.06 mg/dL — ABNORMAL HIGH (ref 0.51–0.95)
GFR (African Amer.): >60 mL/min
GFR: 53 mL/min
Glucose: 101 mg/dL (ref ?–115)
Potassium: 4.3 mmol/L (ref 3.5–5.1)
Sodium: 132 mmol/L — ABNORMAL LOW (ref 136–145)

## 2008-09-11 LAB — PROTHROMBIN TIME, BLOOD
INR: 2.1
PT,Patient: 23.4 s — ABNORMAL HIGH (ref 9.7–12.5)

## 2008-09-12 LAB — ADIF: Plt Est: NORMAL

## 2008-09-12 LAB — CBC WITH DIFF, BLOOD
Basophils: 1 % (ref 0–2)
Eosinophils: 1 % (ref 1–3)
Hct: 39.3 % (ref 36.0–46.0)
Hgb: 13 g/dL (ref 12.0–16.0)
Lymphocytes: 20 % (ref 20–40)
MCH: 25.8 pg — ABNORMAL LOW (ref 27–31)
MCHC: 33.1 % (ref 32–37)
MCV: 78 um3 — ABNORMAL LOW (ref 82.0–98.0)
MPV: 7.9 fL (ref 7.4–10.4)
Monocytes: 7 % (ref 1–10)
Plt Count: 179 10*3/uL (ref 130–400)
RBC: 5.03 10*6/uL — ABNORMAL HIGH (ref 4.00–5.00)
RDW: 17 % — ABNORMAL HIGH (ref 10–15)
Segs: 71 % — ABNORMAL HIGH (ref 45–70)
WBC: 7.2 10*3/uL (ref 4.0–11.0)

## 2008-09-12 LAB — APTT, BLOOD: PTT: 31.1 s (ref 25.0–34.0)

## 2008-09-12 LAB — PROTHROMBIN TIME, BLOOD
INR: 1.6
PT,Patient: 17.6 s — ABNORMAL HIGH (ref 9.7–12.5)

## 2008-09-12 LAB — AEROBIC PANEL-RTNCL/GRMSM

## 2008-09-12 MED ORDER — AZITHROMYCIN 250 MG OR TABS
250.0000 mg | ORAL_TABLET | Freq: Once | ORAL | Status: DC
Start: 2008-09-12 — End: 2008-10-09

## 2008-09-12 MED ORDER — AMOXICILLIN-POT CLAVULANATE 875-125 MG OR TABS
1.0000 | ORAL_TABLET | Freq: Two times a day (BID) | ORAL | Status: DC
Start: 2008-09-12 — End: 2008-10-09

## 2008-09-12 NOTE — Discharge Instructions (Signed)
 1) pls call the pulmonary clinic to arrange follow up appt with Dr Test in 2-3 weeks.   2) you will need to have your INR checked at end of week.

## 2008-09-12 NOTE — Discharge Summary (Signed)
 Dictating Practitioner: Nat Math, M.D.     Staff Physician: Toney Reil. Test, M.D.    Date of Admission: 09/08/2008  Date of Discharge: 09/12/2008        DISCHARGE DIAGNOSES  1. Febrile illness, likely bronchitis.  2. Pulmonary arterial hypertension.  3. Elevated international normalization ratio.    IMAGING STUDIES: Included  1. X-ray.  2. Right upper quadrant ultrasound.    HISTORY OF PRESENT ILLNESS: This is a 58 year old female with a history of  pulmonary arterial hypertension maintained on Flolan and sildenafil who  presents with a febrile illness that occurred 1 week prior to admission.  She notes shaking chills and night sweats with a temperature greater than  102 followed by couple of days of epigastric and right upper quadrant pain,  worse with deep breaths. Temperature defervesced from a high of 102.9 but  remained low grade above 100 in the days prior to her hospital admission.  She also developed a dry cough with some scant production associated with  decreased appetite and some headache, denies orthopnea, lower extremity  edema, or increased abdominal girth. There is no change in her bowel  habits.    PAST MEDICAL HISTORY: Includes  1. Pulmonary arterial hypertension (probably diet pill related). Last  catheterization in our records was done in March 2008. At that time, she  was on Flolan at 29 ng/kg per minute and her mixed venous saturation was  72%. Her RA pressure was 10, PA pressure of 72/29 with a mean of 45,  pulmonary cardiac wedge pressure of 10. Cardiac output is 5.13 L/minute and  pulmonary vascular resistance of 6.8 Wood units. This was hemodynamically  improved compared to her previous study approximately in 2005. She had  minimal additional vasoreactivity in response to inhaled nitric oxide.  2. History of Sjogren's.    ALLERGIES  1. ALLOPURINOL.  2. SULFA.  3. QUINOLONES.    HOME MEDICATIONS: Include  1. Aldactone 50 b.i.d.  2. Lasix 120 total, 80 in the morning and 40 at  night.  3. Flolan at 29 ng/kg per minute.  4. Sildenafil 80 three times daily.  5. Digoxin 250 mcg daily.  6. Coumadin alternating 5 mg at 2.5 mg every day.  7. Folic acid 0.4 mg per day.  8. Potassium supplement 20 mEq 3 times daily.    PHYSICAL EXAMINATION  VITAL SIGNS: On admission, she had a low-grade temperature of 99 degrees,  her pulse was 99, blood pressure 139/64, saturating 93% on room air.  SKIN: Areas of erythema in her proximal extremities.  NECK: She had no obvious JVD.  CARDIAC: Prominent S2 with an S4 noted and a 2/6 tricuspid regurgitation  murmur.  LUNGS: Bilateral crackles at the bases.  ABDOMEN: Soft, nontender.  EXTREMITIES: No peripheral edema.    ADMISSION LABORATORY DATA: White count 8, hematocrit 38, platelets 139.  Chemistries were grossly within normal limits, sodium at 131 which was  stable throughout her admission, BUN 19, creatinine 1.1. Chest x-ray showed  a bilateral hilar opacification and a right upper quadrant ultrasound  showed an enlarged liver measuring 19 cm, borderline enlarged spleen at  13.7 cm, portal vein was patent with appropriate flow and did have a  single, mobile gallstones at 1.2 cm with no evidence of acute  cholecystitis. Other notable labs, her INR was elevated at 4.6 on  admission.    ASSESSMENT AND PLAN: This is a 58 year old female with a history of  pulmonary hypertension on Flolan and sildenafil  who presents with febrile  illness and elevated INR.    HOSPITAL COURSE BY ISSUE  1. Febrile illness. The patient was pancultured with blood, urine and  sputum cultures, and was started on broad-spectrum antibiotics including  coverage for community-acquired pulmonary infection versus line infection  with vancomycin, ceftriaxone, and azithromycin. Her blood cultures remained  negative. There was a set that was drawn on the 30th, the day prior to  discharge in looking for evaluation for micrococcus infection as patients  on Flolan can be predisposed to this particular  organism. However, the  cultures from the date of admission remained negative to date. Her urine  culture showed mixed flora and her sputum cultures showed yeast and normal  flora. While on broad-spectrum antibiotics, the patient had defervesced,  she never mounted a white count, and she was afebrile for greater than 48  hours prior to discharge. She was eventually transitioned over to oral  antibiotics including Augmentin and azithromycin. She will complete 5 days  of azithromycin and a total 10-day course of antibiotics with the  Augmentin. We will follow up on her blood cultures as an outpatient. The  most likely etiology of the fever is from a pulmonary source, bronchitis,  probably viral but cannot rule out bacterial. She did have some associated  right-sided pleuritic chest pain, likely in the setting of this pulmonary  infection that continues to improve with treatment of her underlying  infection.    2. Pulmonary hypertension. The patient was maintained on her home diuretic  regimen. The first few days of admission, she did require some extra  diuresis just to achieve a net negative fluid balance. The day prior to her  discharge, pulmonary artery saturation was obtained and it was 68%  indicating relative stability of her disease. She will continue on her home  medications with no changes to her pulmonary hypertension regimen.    3. Elevated INR. This is likely in the setting due to her illness and poor  p.o. intake. Her Coumadin was held and she showed no signs of active  bleeding and decreased to 1.6 on discharge. Her goal INR will be 1.5 to 2.0  in a patient with diet pill related pulmonary hypertension as well as  having an indwelling catheter. We will repeat her INR as an outpatient as  we restart her Coumadin and while she is on antibiotics.    FOLLOWUP: The patient will follow up with Dr. Larene Beach as an outpatient in the  next 2-3 weeks.    ATTENDING ADDENDUM:    I saw and examined Ms.Diana Chambers the day of  discharge and agree with the  plan listed above.                Reviewed & Electronically Signed by:  Nat Math, M.D. 09/13/2008 09:28 A    Edited and Electronically signed by:  Toney Reil. Test, M.D. 09/22/2008 09:51 A    DD: 09/12/2008 DT: 09/12/2008 12:48 P DocNo.: [id number]  [id number].[facility]    Referring Physician:  EMERGENCY ROOM ADMIT        Primary Care Physician:  Henreitta Leber MD  [address] [place], [place] [address]    cc:

## 2008-09-13 LAB — BLOOD CULTURE

## 2008-09-16 LAB — BLOOD CULTURE

## 2008-10-09 ENCOUNTER — Other Ambulatory Visit: Payer: Self-pay

## 2008-10-09 ENCOUNTER — Ambulatory Visit (HOSPITAL_COMMUNITY)

## 2008-10-09 ENCOUNTER — Other Ambulatory Visit (INDEPENDENT_AMBULATORY_CARE_PROVIDER_SITE_OTHER): Payer: Self-pay | Admitting: Pulmonary Disease

## 2008-10-09 ENCOUNTER — Ambulatory Visit (INDEPENDENT_AMBULATORY_CARE_PROVIDER_SITE_OTHER): Admitting: Pulmonary Disease

## 2008-10-09 LAB — PROTHROMBIN TIME, BLOOD
INR: 2.5
PT,Patient: 28.3 s — ABNORMAL HIGH (ref 9.7–12.5)

## 2008-10-09 LAB — COMPREHENSIVE METABOLIC PANEL, BLOOD
ALT (SGPT): 9 U/L
AST (SGOT): 14 U/L
Albumin: 4.2 g/dL (ref 3.5–5.2)
Alkaline Phos: 80 U/L
BUN: 26 mg/dL — ABNORMAL HIGH (ref 6–20)
Bicarbonate: 25 mmol/L (ref 22–29)
Bilirubin, Tot: 0.4 mg/dL (ref <1.2)
Calcium: 10.3 mg/dL (ref 8.6–10.5)
Chloride: 104 mmol (ref 98–107)
Creatinine: 1.12 mg/dL — ABNORMAL HIGH (ref 0.51–0.95)
GFR (African Amer.): 60 mL/min
GFR: 50 mL/min
Glucose: 90 mg/dL (ref ?–115)
Potassium: 4.1 mmol/L (ref 3.5–5.1)
Sodium: 138 mmol/L (ref 136–145)
Total Protein: 8.4 g/dL — ABNORMAL HIGH (ref ?–8.0)

## 2008-10-09 LAB — TSH, BLOOD: TSH: 2.09 u[IU]/mL (ref 0.27–4.20)

## 2008-10-09 LAB — IBC - IRON BINDING CAPACITY
Iron Saturation: 18 %
Total IBC: 289 ug/dL (ref 148–506)
UIBC: 236 ug/dL (ref 112–346)

## 2008-10-09 LAB — PRO BNP, BLOOD: BNPP: 733 pg/mL — ABNORMAL HIGH (ref <125)

## 2008-10-09 LAB — IRON, BLOOD: Iron: 53 ug/dL

## 2008-10-09 LAB — FERRITIN, BLOOD: Ferritin: 76 ng/mL

## 2008-10-09 LAB — BNP, BLOOD: BNP: 53 pg/mL (ref <100)

## 2008-10-09 MED ORDER — WARFARIN SODIUM 5 MG OR TABS
ORAL_TABLET | Freq: Every day | ORAL | Status: DC
Start: 2008-10-09 — End: 2009-12-24

## 2009-01-22 ENCOUNTER — Ambulatory Visit (INDEPENDENT_AMBULATORY_CARE_PROVIDER_SITE_OTHER): Admitting: Pulmonary Medicine

## 2009-01-22 ENCOUNTER — Ambulatory Visit (HOSPITAL_COMMUNITY)

## 2009-03-26 ENCOUNTER — Ambulatory Visit
Admit: 2009-03-26 | Discharge: 2009-03-29 | Payer: Self-pay | Source: Ambulatory Visit | Attending: Pulmonary Medicine | Admitting: Pulmonary Medicine

## 2009-03-26 LAB — CBC WITH DIFF, BLOOD
Abs Lymphs: 1.6 10*3/uL (ref 0.8–3.1)
Abs Monos: 0.3 10*3/uL (ref 0.2–0.8)
Absolute Neutrophil Count: 3.4 10*3/uL (ref 1.6–7.0)
Eosinophils: 1 % (ref 1–7)
Hct: 40 % (ref 34.0–45.0)
Hgb: 13.2 g/dL (ref 11.2–15.7)
Imm Gran %: 1 % (ref 0–1)
Imm Gran Abs: 0.1 10*3/uL (ref 0.0–0.1)
Lymphocytes: 29 % (ref 19–53)
MCH: 25 pg — ABNORMAL LOW (ref 26.0–32.0)
MCHC: 33 % (ref 32.0–36.0)
MCV: 75.9 um3 — ABNORMAL LOW (ref 79.0–95.0)
MPV: 9.3 fL — ABNORMAL LOW (ref 9.4–12.4)
Monocytes: 5 % (ref 5–12)
Plt Count: 141 10*3/uL — ABNORMAL LOW (ref 160–370)
RBC: 5.27 10*6/uL — ABNORMAL HIGH (ref 3.90–5.20)
RDW: 17.4 % — ABNORMAL HIGH (ref 12.0–14.0)
Segs: 64 % (ref 34–71)
WBC: 5.4 10*3/uL (ref 4.0–10.0)

## 2009-03-26 LAB — PROTHROMBIN TIME, BLOOD
INR: 1.4
PT,Patient: 15.3 s — ABNORMAL HIGH (ref 9.7–12.5)

## 2009-03-26 LAB — COMPREHENSIVE METABOLIC PANEL, BLOOD
ALT (SGPT): 7 U/L (ref 0–33)
AST (SGOT): 12 U/L (ref 0–32)
Albumin: 4.5 g/dL (ref 3.5–5.2)
Alkaline Phos: 80 U/L (ref 35–140)
BUN: 26 mg/dL — ABNORMAL HIGH (ref 6–20)
Bicarbonate: 22 mmol/L (ref 22–29)
Bilirubin, Tot: 0.4 mg/dL (ref <1.2)
Calcium: 10.1 mg/dL (ref 8.6–10.5)
Chloride: 103 mmol/L (ref 98–107)
Creatinine: 1.05 mg/dL — ABNORMAL HIGH (ref 0.51–0.95)
Glucose: 90 mg/dL (ref 70–115)
Potassium: 4 mmol/L (ref 3.5–5.1)
Sodium: 135 mmol/L — ABNORMAL LOW (ref 136–145)
Total Protein: 8.6 g/dL — ABNORMAL HIGH (ref 6.0–8.0)

## 2009-03-26 LAB — GFR: GFR: 54 mL/min

## 2009-03-26 LAB — APTT, BLOOD: PTT: 34.9 s — ABNORMAL HIGH (ref 25.0–34.0)

## 2009-06-09 ENCOUNTER — Other Ambulatory Visit: Payer: Self-pay

## 2009-06-09 ENCOUNTER — Emergency Department
Admit: 2009-06-09 | Discharge: 2009-06-11 | Disposition: A | Discharge status: Home Health | Payer: Self-pay | Attending: Emergency Medicine | Admitting: Emergency Medicine

## 2009-06-09 LAB — BASIC METABOLIC PANEL, BLOOD
BUN: 29 mg/dL — ABNORMAL HIGH (ref 6–20)
Bicarbonate: 24 mmol/L (ref 22–29)
Calcium: 10.3 mg/dL (ref 8.6–10.5)
Chloride: 104 mmol/L (ref 98–107)
Creatinine: 1.08 mg/dL — ABNORMAL HIGH (ref 0.51–0.95)
Glucose: 107 mg/dL (ref 70–115)
Potassium: 3.7 mmol/L (ref 3.5–5.1)
Sodium: 137 mmol/L (ref 136–145)

## 2009-06-09 LAB — LIVER PANEL, BLOOD
ALT (SGPT): 8 U/L (ref 0–33)
AST (SGOT): 13 U/L (ref 0–32)
Albumin: 4.1 g/dL (ref 3.5–5.2)
Alkaline Phos: 75 U/L (ref 35–140)
Bilirubin, Dir: 0.1 mg/dL (ref <0.2)
Bilirubin, Tot: 0.3 mg/dL (ref <1.2)
Total Protein: 8.2 g/dL — ABNORMAL HIGH (ref 6.0–8.0)

## 2009-06-09 LAB — CBC WITH DIFF, BLOOD
Abs Eosinophils: 0.1 10*3/uL (ref 0.0–0.5)
Abs Lymphs: 1.4 10*3/uL (ref 0.8–3.1)
Abs Monos: 0.3 10*3/uL (ref 0.2–0.8)
Absolute Neutrophil Count: 3.4 10*3/uL (ref 1.6–7.0)
Eosinophils: 1 % (ref 1–7)
Hct: 37.2 % (ref 34.0–45.0)
Hgb: 12.3 g/dL (ref 11.2–15.7)
Imm Gran %: 1 % (ref 0–1)
Lymphocytes: 27 % (ref 19–53)
MCH: 25.1 pg — ABNORMAL LOW (ref 26.0–32.0)
MCHC: 33.1 % (ref 32.0–36.0)
MCV: 75.8 um3 — ABNORMAL LOW (ref 79.0–95.0)
MPV: 10 fL (ref 9.4–12.4)
Monocytes: 6 % (ref 5–12)
Plt Count: 128 10*3/uL — ABNORMAL LOW (ref 160–370)
RBC: 4.91 10*6/uL (ref 3.90–5.20)
RDW: 16.7 % — ABNORMAL HIGH (ref 12.0–14.0)
Segs: 65 % (ref 34–71)
WBC: 5.2 10*3/uL (ref 4.0–10.0)

## 2009-06-09 LAB — TSH, BLOOD: TSH: 3.02 u[IU]/mL (ref 0.27–4.20)

## 2009-06-09 LAB — APTT, BLOOD: PTT: 38.3 s — ABNORMAL HIGH (ref 25.0–34.0)

## 2009-06-09 LAB — PROTHROMBIN TIME, BLOOD
INR: 2.3
PT,Patient: 25.6 s — ABNORMAL HIGH (ref 9.7–12.5)

## 2009-06-09 LAB — TROPONIN T, BLOOD: Troponin T: <0.01 ng/mL (ref <0.01)

## 2009-06-09 LAB — CPK-CREATINE PHOSPHOKINASE, BLOOD: CPK: 21 U/L (ref 0–175)

## 2009-06-09 LAB — GFR: GFR: 52 mL/min

## 2009-06-09 LAB — DIGOXIN, BLOOD: Digoxin: 1.2 ng/mL (ref 0.9–2.5)

## 2009-06-10 LAB — ECG, COMPLETE (HC/~~LOC~~/ENCINITAS)
ATRIAL RATE: 91 {beats}/min
P AXIS: 24 degrees
PR INTERVAL: 234 ms
QRS INTERVAL/DURATION: 86 ms
QT: 310 ms
QTc (Bazett): 381 ms
R AXIS: 100 degrees
T AXIS: 77 degrees
VENTRICULAR RATE: 91 {beats}/min

## 2009-06-10 LAB — FREE THYROXINE, BLOOD: Free T4: 1.29 ng/dL (ref 0.93–1.70)

## 2009-06-21 ENCOUNTER — Encounter (INDEPENDENT_AMBULATORY_CARE_PROVIDER_SITE_OTHER): Payer: Self-pay | Admitting: Pulmonary Medicine

## 2009-07-12 ENCOUNTER — Encounter (HOSPITAL_COMMUNITY): Payer: Self-pay | Admitting: Pulmonary Medicine

## 2009-07-12 ENCOUNTER — Inpatient Hospital Stay
Admission: EM | Admit: 2009-07-12 | Discharge: 2009-07-16 | Disposition: A | Discharge status: Home Health | Payer: Self-pay | Attending: Pulmonary Medicine | Admitting: Pulmonary Medicine

## 2009-07-12 DIAGNOSIS — J189 Pneumonia, unspecified organism: Secondary | ICD-10-CM

## 2009-07-12 DIAGNOSIS — Z7901 Long term (current) use of anticoagulants: Secondary | ICD-10-CM

## 2009-07-12 DIAGNOSIS — M3500 Sicca syndrome, unspecified: Secondary | ICD-10-CM

## 2009-07-12 DIAGNOSIS — J40 Bronchitis, not specified as acute or chronic: Secondary | ICD-10-CM

## 2009-07-12 DIAGNOSIS — I2721 Secondary pulmonary arterial hypertension: Principal | ICD-10-CM

## 2009-07-12 HISTORY — DX: Sjogren syndrome, unspecified (CMS-HCC): M35.00

## 2009-07-12 HISTORY — DX: Sicca syndrome, unspecified (CMS-HCC): M35.00

## 2009-07-12 LAB — URINALYSIS
Bilirubin: NEGATIVE
Blood: NEGATIVE
Glucose: NEGATIVE
Ketones: NEGATIVE
Leuk Esterase: NEGATIVE
Nitrite: NEGATIVE
Specific Gravity: 1.007 (ref 1.002–1.030)
WBC: <1
pH: 5 (ref 5.0–8.0)

## 2009-07-12 LAB — LIVER PANEL, BLOOD
ALT (SGPT): 9 U/L (ref 0–33)
AST (SGOT): 18 U/L (ref 0–32)
Albumin: 3.9 g/dL (ref 3.5–5.2)
Alkaline Phos: 75 U/L (ref 35–140)
Bilirubin, Dir: 0.1 mg/dL (ref <0.2)
Bilirubin, Tot: 0.6 mg/dL (ref <1.2)
Total Protein: 8.2 g/dL — ABNORMAL HIGH (ref 6.0–8.0)

## 2009-07-12 LAB — LACTATE, BLOOD: Lactate: 5 mg/dL (ref 4.5–19.8)

## 2009-07-12 LAB — PROTHROMBIN TIME, BLOOD
INR: 2
PT,Patient: 22 s — ABNORMAL HIGH (ref 9.7–12.5)

## 2009-07-12 LAB — SEPSIS PANEL, BLOOD
Abs Lymphs: 1.5 10*3/uL (ref 0.8–3.1)
Abs Monos: 0.5 10*3/uL (ref 0.2–0.8)
Absolute Neutrophil Count: 5.9 10*3/uL (ref 1.6–7.0)
BUN: 27 mg/dL — ABNORMAL HIGH (ref 6–20)
Bicarbonate: 23 mmol/L (ref 22–29)
Calcium: 10.4 mg/dL (ref 8.6–10.5)
Chloride: 100 mmol/L (ref 98–107)
Creatinine: 1.2 mg/dL — ABNORMAL HIGH (ref 0.51–0.95)
Glucose: 103 mg/dL (ref 70–115)
Hct: 37.6 % (ref 34.0–45.0)
Hgb: 12.5 g/dL (ref 11.2–15.7)
Lymphocytes: 18 % — ABNORMAL LOW (ref 19–53)
MCH: 25 pg — ABNORMAL LOW (ref 26.0–32.0)
MCHC: 33.2 % (ref 32.0–36.0)
MCV: 75.2 um3 — ABNORMAL LOW (ref 79.0–95.0)
MPV: 10.3 fL (ref 9.4–12.4)
Magnesium: 2.1 mg/dL (ref 1.7–2.6)
Monocytes: 7 % (ref 5–12)
Phosphorous: 2.7 mg/dL (ref 2.7–4.5)
Plt Count: 116 10*3/uL — ABNORMAL LOW (ref 160–370)
Potassium: 4.3 mmol/L (ref 3.5–5.1)
RBC: 5 10*6/uL (ref 3.90–5.20)
RDW: 16.7 % — ABNORMAL HIGH (ref 12.0–14.0)
Segs: 74 % — ABNORMAL HIGH (ref 34–71)
Sodium: 134 mmol/L — ABNORMAL LOW (ref 136–145)
WBC: 8 10*3/uL (ref 4.0–10.0)

## 2009-07-12 LAB — GFR: GFR: 46 mL/min

## 2009-07-12 LAB — APTT, BLOOD: PTT: 36.5 s — ABNORMAL HIGH (ref 25.0–34.0)

## 2009-07-12 LAB — LIPASE, BLOOD: Lipase: 38 U/L (ref 13–60)

## 2009-07-12 MED ORDER — CEFTRIAXONE SODIUM 1 GM IJ SOLR
INTRAMUSCULAR | Status: AC
Start: 2009-07-12 — End: 2009-07-12
  Filled 2009-07-12: qty 1000

## 2009-07-12 MED ORDER — DEXTROSE 5 % IV SOLN
500.0000 mg | Freq: Once | INTRAVENOUS | Status: DC | PRN
Start: 2009-07-12 — End: 2009-07-13
  Filled 2009-07-12: qty 500

## 2009-07-12 MED ORDER — VITAMIN D 1000 UNIT OR CAPS
2000.00 [IU] | ORAL_CAPSULE | Freq: Every day | ORAL | Status: DC
Start: ? — End: 2011-06-24

## 2009-07-12 MED ORDER — SODIUM CHLORIDE 0.9 % IV SOLN
1000.0000 mg | Freq: Once | INTRAVENOUS | Status: DC | PRN
Start: 2009-07-12 — End: 2009-07-13

## 2009-07-12 MED ORDER — MEDROXYPROGESTERONE ACETATE 2.5 MG OR TABS: 1.25 mg | ORAL_TABLET | Freq: Every day | ORAL | Status: AC

## 2009-07-12 MED ORDER — ACETAMINOPHEN 325 MG PO TABS
650.0000 mg | ORAL_TABLET | Freq: Once | ORAL | Status: DC | PRN
Start: 2009-07-12 — End: 2009-07-13
  Filled 2009-07-12: qty 2

## 2009-07-13 LAB — CBC WITH DIFF, BLOOD
Abs Eosinophils: 0.1 10*3/uL (ref 0.0–0.5)
Abs Lymphs: 1.6 10*3/uL (ref 0.8–3.1)
Abs Monos: 0.4 10*3/uL (ref 0.2–0.8)
Absolute Neutrophil Count: 3.2 10*3/uL (ref 1.6–7.0)
Eosinophils: 1 % (ref 1–7)
Hct: 39.9 % (ref 34.0–45.0)
Hgb: 13.1 gm/dL (ref 11.2–15.7)
Imm Gran %: 1 % (ref 0–1)
Lymphocytes: 31 % (ref 19–53)
MCH: 24.9 pg — ABNORMAL LOW (ref 26.0–32.0)
MCHC: 32.8 % (ref 32.0–36.0)
MCV: 75.9 um3 — ABNORMAL LOW (ref 79.0–95.0)
MPV: 10.2 fL (ref 9.4–12.4)
Monocytes: 7 % (ref 5–12)
Plt Count: 118 10*3/uL — ABNORMAL LOW (ref 160–370)
RBC: 5.26 10*6/uL — ABNORMAL HIGH (ref 3.90–5.20)
RDW: 16.9 % — ABNORMAL HIGH (ref 12.0–14.0)
Segs: 61 % (ref 34–71)
WBC: 5.3 10*3/uL (ref 4.0–10.0)

## 2009-07-13 LAB — PROTHROMBIN TIME, BLOOD
INR: 2.2
PT,Patient: 24.1 s — ABNORMAL HIGH (ref 9.7–12.5)

## 2009-07-13 LAB — BASIC METABOLIC PANEL, BLOOD
BUN: 28 mg/dL — ABNORMAL HIGH (ref 6–20)
Bicarbonate: 24 mmol/L (ref 22–29)
Calcium: 10.5 mg/dL (ref 8.6–10.5)
Chloride: 101 mmol/L (ref 98–107)
Creatinine: 1.17 mg/dL — ABNORMAL HIGH (ref 0.51–0.95)
Glucose: 108 mg/dL (ref 70–115)
Potassium: 3.7 mmol/L (ref 3.5–5.1)
Sodium: 136 mmol/L (ref 136–145)

## 2009-07-13 LAB — MAGNESIUM, BLOOD: Magnesium: 2.2 mg/dL (ref 1.7–2.6)

## 2009-07-13 LAB — GFR: GFR: 47 mL/min

## 2009-07-13 LAB — TSH, BLOOD: TSH: 3.55 u[IU]/mL (ref 0.27–4.20)

## 2009-07-13 LAB — PHOSPHORUS, BLOOD: Phosphorous: 3.6 mg/dL (ref 2.7–4.5)

## 2009-07-13 MED ORDER — ACETAMINOPHEN 325 MG PO TABS
650.0000 mg | ORAL_TABLET | ORAL | Status: DC | PRN
Start: 2009-07-13 — End: 2009-07-16
  Administered 2009-07-14: 650 mg via ORAL
  Filled 2009-07-13: qty 2

## 2009-07-13 MED ORDER — VITAMIN D 1000 UNIT OR TABS
2000.0000 [IU] | ORAL_TABLET | Freq: Every day | ORAL | Status: DC
Start: 2009-07-13 — End: 2009-07-16
  Administered 2009-07-13 – 2009-07-16 (×4): 2000 [IU] via ORAL
  Filled 2009-07-13 (×4): qty 2

## 2009-07-13 MED ORDER — SODIUM CHLORIDE 0.9 % IV SOLN
2000.0000 mg | INTRAVENOUS | Status: DC
Start: 2009-07-13 — End: 2009-07-15
  Administered 2009-07-13 – 2009-07-15 (×3): 2000 mg via INTRAVENOUS
  Filled 2009-07-13 (×3): qty 50

## 2009-07-13 MED ORDER — MEDROXYPROGESTERONE ACETATE 2.5 MG OR TABS
1.2500 mg | ORAL_TABLET | Freq: Every day | ORAL | Status: DC
Start: 2009-07-13 — End: 2009-07-16
  Administered 2009-07-13: 1.25 mg via ORAL
  Administered 2009-07-14: 2.5 mg via ORAL
  Administered 2009-07-15 – 2009-07-16 (×2): 1.25 mg via ORAL
  Filled 2009-07-13 (×4): qty 1

## 2009-07-13 MED ORDER — SODIUM CHLORIDE 0.9 % IJ SOLN (CUSTOM)
3.0000 mL | INTRAMUSCULAR | Status: DC | PRN
Start: 2009-07-13 — End: 2009-07-16

## 2009-07-13 MED ORDER — PROSTACYCLIN ICE PACK (~~LOC~~)
Freq: Four times a day (QID) | Status: DC
Start: 2009-07-13 — End: 2009-07-16
  Administered 2009-07-15 (×4): 2
  Filled 2009-07-13: qty 1

## 2009-07-13 MED ORDER — FUROSEMIDE 40 MG OR TABS
80.0000 mg | ORAL_TABLET | Freq: Every morning | ORAL | Status: DC
Start: 2009-07-13 — End: 2009-07-16
  Administered 2009-07-13: 80 mg via ORAL
  Administered 2009-07-14: 40 mg via ORAL
  Administered 2009-07-14 – 2009-07-16 (×3): 80 mg via ORAL
  Filled 2009-07-13 (×6): qty 2

## 2009-07-13 MED ORDER — ONDANSETRON HCL 4 MG/2ML IV SOLN
4.0000 mg | Freq: Four times a day (QID) | INTRAMUSCULAR | Status: DC | PRN
Start: 2009-07-13 — End: 2009-07-16

## 2009-07-13 MED ORDER — GLYCINE DILUENT IV SOLN
29.0000 ng/kg/min | INTRAVENOUS | Status: DC
Start: 2009-07-13 — End: 2009-07-16
  Administered 2009-07-13 – 2009-07-16 (×5): 29 ng/kg/min via INTRAVENOUS
  Filled 2009-07-13 (×6): qty 4.5

## 2009-07-13 MED ORDER — FUROSEMIDE 40 MG OR TABS
40.0000 mg | ORAL_TABLET | Freq: Every evening | ORAL | Status: DC
Start: 2009-07-13 — End: 2009-07-16
  Administered 2009-07-13 – 2009-07-15 (×2): 40 mg via ORAL
  Filled 2009-07-13: qty 1

## 2009-07-13 MED ORDER — SODIUM CHLORIDE 0.9 % IJ SOLN (CUSTOM)
3.0000 mL | Freq: Three times a day (TID) | INTRAMUSCULAR | Status: DC
Start: 2009-07-13 — End: 2009-07-16
  Administered 2009-07-13 – 2009-07-15 (×5): 3 mL via INTRAVENOUS

## 2009-07-13 MED ORDER — PROSTACYCLIN BATTERIES - PUMP (~~LOC~~)
Status: DC
Start: 2009-07-16 — End: 2009-07-16
  Filled 2009-07-13: qty 1

## 2009-07-13 MED ORDER — VANCOMYCIN HCL 1000 MG IV SOLR
1000.0000 mg | Freq: Two times a day (BID) | INTRAVENOUS | Status: DC
Start: 2009-07-13 — End: 2009-07-13

## 2009-07-13 MED ORDER — PROSTACYCLIN TUBING (~~LOC~~)
Status: DC
Start: 2009-07-13 — End: 2009-07-16
  Filled 2009-07-13: qty 1

## 2009-07-13 MED ORDER — DEXTROSE 5 % IV SOLN
500.0000 mg | INTRAVENOUS | Status: DC
Start: 2009-07-13 — End: 2009-07-15
  Administered 2009-07-13 – 2009-07-14 (×2): 500 mg via INTRAVENOUS
  Filled 2009-07-13 (×3): qty 500

## 2009-07-13 MED ORDER — PROSTACYCLIN CASSETTE CHANGE (~~LOC~~)
Freq: Every day | Status: DC
Start: 2009-07-13 — End: 2009-07-16
  Filled 2009-07-13: qty 1

## 2009-07-13 MED ORDER — VANCOMYCIN HCL 1000 MG IV SOLR
750.0000 mg | Freq: Two times a day (BID) | INTRAVENOUS | Status: DC
Start: 2009-07-13 — End: 2009-07-14
  Administered 2009-07-13 – 2009-07-14 (×3): 750 mg via INTRAVENOUS
  Filled 2009-07-13 (×5): qty 750

## 2009-07-13 MED ORDER — ENOXAPARIN SODIUM 40 MG/0.4ML SC SOLN
40.0000 mg | Freq: Every day | SUBCUTANEOUS | Status: DC
Start: 2009-07-13 — End: 2009-07-13

## 2009-07-13 MED ORDER — SODIUM CHLORIDE 0.9 % IV SOLN
INTRAVENOUS | Status: DC | PRN
Start: 2009-07-13 — End: 2009-07-16

## 2009-07-13 MED ORDER — ALUM & MAG HYDROXIDE-SIMETH 200-200-20 MG/5ML OR SUSP
30.0000 mL | Freq: Four times a day (QID) | ORAL | Status: DC | PRN
Start: 2009-07-13 — End: 2009-07-16

## 2009-07-13 MED ORDER — DIGOXIN 0.25 MG OR TABS
250.0000 ug | ORAL_TABLET | Freq: Every evening | ORAL | Status: DC
Start: 2009-07-13 — End: 2009-07-16
  Administered 2009-07-13 – 2009-07-15 (×3): 250 ug via ORAL
  Filled 2009-07-13 (×3): qty 1

## 2009-07-13 MED ORDER — WARFARIN SODIUM 5 MG OR TABS
5.0000 mg | ORAL_TABLET | Freq: Every evening | ORAL | Status: DC
Start: 2009-07-13 — End: 2009-07-14
  Administered 2009-07-13 – 2009-07-14 (×2): 5 mg via ORAL
  Filled 2009-07-13 (×2): qty 1

## 2009-07-13 MED ORDER — SPIRONOLACTONE 25 MG OR TABS
50.0000 mg | ORAL_TABLET | Freq: Two times a day (BID) | ORAL | Status: DC
Start: 2009-07-13 — End: 2009-07-16
  Administered 2009-07-13 – 2009-07-16 (×7): 50 mg via ORAL
  Filled 2009-07-13 (×7): qty 2

## 2009-07-13 MED ORDER — FOLIC ACID 1 MG OR TABS
500.0000 ug | ORAL_TABLET | Freq: Every day | ORAL | Status: DC
Start: 2009-07-13 — End: 2009-07-16
  Administered 2009-07-13 – 2009-07-16 (×4): 500 ug via ORAL
  Filled 2009-07-13 (×4): qty 1

## 2009-07-13 MED ORDER — SILDENAFIL CITRATE 20 MG OR TABS
80.0000 mg | ORAL_TABLET | Freq: Three times a day (TID) | ORAL | Status: DC
Start: 2009-07-13 — End: 2009-07-16
  Administered 2009-07-13 – 2009-07-16 (×11): 80 mg via ORAL
  Filled 2009-07-13 (×11): qty 4

## 2009-07-13 NOTE — Interdisciplinary (Signed)
 Flolan cassette and tubing changed. Double checked with second Flolan competent RN, spare pump and coin as well as spare batteries in pt's case at bedside. Verified that pump is in "run" mode, settings verified. Infusing through dedicated Hickman to L chest.

## 2009-07-13 NOTE — Interdisciplinary (Signed)
 Bedside handoff report given to Jeralyn Bennett, RN. Flolan pump double checked, Dante aware that pt still needs sputum culture sent.

## 2009-07-13 NOTE — ED Notes (Addendum)
 ============================== ATTENDING NOTE =============================    06/30 2329 Gates Rigg, MD Attending     07/12/09         Dictating Practitioner: Gates Rigg, D.O.            Date of Service: 07/12/2009               TIME SEEN: On time.         CHIEF COMPLAINT: Shortness of breath.         HISTORY OF PRESENT ILLNESS: This is a 59 year old female   with a history of      pulmonary hypertension, on Flolan, who presents complaining   of shortness of      breath for the last 2-3 days. The patient also admits to a   nonproductive      cough, fever, and pain to the left lower rib cage. The   patient states she      was in [facility], where she had a chest x-ray and was told   that it might      be pneumonia. The patient denies any headache, neck pain,   neck stiffness,      sore throat, chest pain, midsternal chest pain, abdominal   pain, or flank      pain. Denies any urinary complaints.         PAST MEDICAL/SURGICAL HISTORY: As above.         MEDICATIONS: See nursing note, reviewed by me.         ALLERGIES: SEE NURSING NOTE, REVIEWED BY ME.         SOCIAL HISTORY: The patient denies tobacco, alcohol, or   illicits.         FAMILY HISTORY: Unremarkable.         REVIEW OF SYSTEMS: All systems reviewed and were negative,   except for      history of present illness.         VITAL SIGNS: Temperature 101.3, blood pressure 145/77,   pulse 107,      respirations 16, pulse oximetry 90% on room air, which is   low. The patient      states her pulse oximetry normally is 94% to 95% on room   air.         PHYSICAL EXAM      GENERAL APPEARANCE: The patient is minimally tachypneic,   however does not      appear to be in severe respiratory distress. The patient has   no      conversational dyspnea. The patient otherwise looks well   hydrated, well      nourished, and nontoxic.      SKIN: Turgor is good, with no rash or lesions.      HEENT: Atraumatic, normocephalic. PERRL. Tympanic membranes   clear.  Throat      is clear.      NECK: Supple, with no adenopathy or meningismus.      HEART: Regular rate and rhythm.      LUNGS: There are crackles in the left lower lung. There is   no wheezing.      ABDOMEN: Soft. Bowel sounds present. Nontender.      EXTREMITIES: No clubbing, cyanosis, or edema.      NEUROLOGIC: No gross focal deficits.         DIAGNOSTIC STUDIES AND INTERPRETATION: UA is negative. CBC   remarkable for      a white count of 8 and hematocrit  37.6. Lactic acid 5.   Chemistry remarkable      for creatinine of 1.2. LFTs within normal limits. Lipase 38,   which is      normal.         Chest x-ray, interpreted by radiologist, remarkable for left   lower lobe      consolidation.         EMERGENCY DEPARTMENT COURSE/MEDICAL DECISION MAKING: The   patient was given      Tylenol p.o. for fever and pain. The patient had blood   cultures x2      obtained. The patient was placed on oxygen 2 L by nasal   cannula, with an      oxygenation of 97%. The Pulmonary Service was notified and   discussed the      case. They agreed with admission; however, they wanted me to   hold off on      antibiotics until they reviewed the patient's chart and   could give her      appropriate antibiotics.         DIAGNOSIS      1. Acute shortness of breath.      2. Acute left lower lobe pneumonia.      3. History of pulmonary hypertension.      4. Acute hypoxemia.         PLAN: Admission to the Pulmonary Service. Condition upon   admission      stable.                                                            DD: 07/12/2009 DT: 07/12/2009 11:08 P DocNo.: [id number]      [id number]

## 2009-07-13 NOTE — Interdisciplinary (Signed)
 07/13/09 1406   Patient Information   Why is Patient in the Hospital? pna, sob   Prior to Level of Function Ambulatory/Independent with ADL's   Discharge Planning   Living Arrangements Spouse / significant other   Support Systems Spouse / significant other   Type of Residence Private residence  (lives in [place])   Home Care Services No   Type of Home Care Services None   Patient expects to be discharged to: home   Do you have transportation home?  Yes  (husband)   Ecologist   Do you need to see a Child psychotherapist?  No      patient husband will drive patient back to [place]. D/c date unknown at the present time. Do not anticipate any d/c needs.

## 2009-07-13 NOTE — Procedures (Signed)
 PERFORMED ON - 07/13/2009 13:00:04;   DONE BY - ZENKUS, BARBARA L;  PROCEDURE - OXYGEN-LOW FLOW;   PROTOCOL DRIVEN: YES-MD INITIATED;   OXYGEN CHECK: DONE;   O2 DEVICE: CANNULA-NASAL;   LITERS/MIN: 2;   ADVERSE REACTIONS: NONE;   ELECTRONIC SIGNATURE DERIVED FROM A SINGLE CONTROLLED ACCESS PASSWORD:   Clent Jacks L ; 07/13/2009 14:52:04

## 2009-07-13 NOTE — ED Notes (Addendum)
 DATE TIME CYTE TRITE PH TEIN COSE KETONES BILI BILI BLOOD BY     FINGER STICK GLUCOSE   Norm results 65 to 110 mg/dl  DATE TIME RESULTS DONE BY     FINGER STICK HEMOGLOBIN   Norm results adult female 54 to 17 gm/dl  Norm results adult female 39 to 16 gm/dl  DATE TIME RESULTS DONE BY     ============================== MD NOTES H&P ===============================     SEE ATTENDING NOTE FOR H&P INFORMATION    ============================= PHYSICIAN NOTES =============================    06/30 2252 Gates Rigg, MD Attending     dr Reuel Derby wants rocephin and azithromycin iv to be started    06/30 2313 Jeraldine Loots, MD Attending     Sign-out:   Assuming care of this 59 yo f with pulmonary hypertenstion   with cough and fever and infiltrate on CXR. Mild hypoxia   but otherwise stable. Given ceftriaxone and azithromycin   and being admitted to pulmonary.      ============================= PROCEDURE NOTES =============================      ================================ LAB NOTES ================================      ================================= IMAGES ==================================      ============================== MD DISCHARGE ===============================    DISCHARGE PHYSICIAN- Fraidoon Mostamand   CHIEF COMPLAINT- N/A CASE PRESENTED TO- Fraidoon Mostamand  CONDITION OF DISCHARGE- Improving   WAS THIS VISIT FOR A WORK RELATED ILLNESS OR INJURY- No   PRIMARY CARE PHYSICIAN- POCH DAVID MD   HAS PCP BEEN CONTACTED- N/A H&P NOTE WAS DICTATED- Yes    +-------------------------- DISCHARGE DIAGNOSIS --------------------------+   486 PNEUMONIA ORGANISM UNSPEC    780.60 FEVER    786.05 SHORTNESS BREATH    799.02 HYPOXEMIA     +------------------------- DISCHARGE INSTRUCTIONS ------------------------+      +----------------------- MEDICATION RECONCILIATION -----------------------+    ---------------------------- CURRENT MEDICATION ---------------------------  MEDICATION NAME DOSAGE  FREQUENCY  ---------------------------------------------------------------------------    FOLIC ACID 400 Micrograms Once a Day   POTASSIUM CHLORIDE 20 Milli-Equivalents Every 8 Hours   LASIX 80 Milligrams Once a Day   VIAGRA 80 Milligrams Every 8 Hours   LASIX 40 Milligrams Every Night   COUMADIN 5 Milligrams Once a Day   DIGOXIN 0.25 Milligrams Once a Day   ALDACTONE 50 Milliliters Every 12 Hours  FLOLAN 80 Milliliters Once a Day   Vitamin D3 2 UNIT Once a Day   MedroxyPROGESTERone Acetate 0.5 MG Once a Day   Divigel MG/0.25GM Every Night     ---------------------------- STOPPED MEDICATION ---------------------------  MEDICATION NAME DOSAGE FREQUENCY  ---------------------------------------------------------------------------      ---------------------------- UPDATED MEDICATION ---------------------------  MEDICATION NAME DOSAGE FREQUENCY  ---------------------------------------------------------------------------      ----------------------------- ADDED MEDICATION ----------------------------  MEDICATION NAME DOSAGE FREQUENCY  ---------------------------------------------------------------------------        ============================= FOLLOW UP NOTES =============================

## 2009-07-13 NOTE — Plan of Care (Signed)
 Problem: Breathing Pattern - Ineffective  Goal: Respiratory rate, rhythm and depth return to baseline  Outcome: Met  Flolan pump running per orders; dosing wt, concentration, and rate verified with second RN at shift change. Flolan dosing sheet posted in room. Pump in run, second pump in room as backup. Pt has medication infusing through dedicated line, secondary PIV in place with no secondary tubing, hub attached directly to catheter; flushed to ensure patency. Pt on 2L O2, O2 sat 93%, denies SOB at rest but has some DOE.     Problem: Falls - Risk of  Goal: Absence of falls  Outcome: Met  Pt verbalizes understanding of fall precautions and calls for assistance OOB. Fall precautions implemented; bed is locked in low position, side rails up x2, call light and bedside table in reach. Non-skid footwear in place, white board in use. Hourly and prn rounding on pt to ensure needs are met and to assure pt safety. No falls this shift.     Problem: Infection  Goal: Absence of infection signs and symptoms  Outcome: Met  Pt afebrile, receiving antibiotics as ordered. Monitor labs and treat.    Problem: Infection - Risk of, Central Venous Catheter-Associated Bloodstream Infection  Goal: Absence of infection signs and symptoms  Outcome: Met  Pt states she changes her CVC drsg with each shower; says she changed drsg yesterday prior to hospital admission. Site is CDI, no drainage noted, catheter secured in place with tape.    Problem: Pain - Acute  Goal: Control of acute pain  Outcome: Met  Pt reports intermittent pain to L ribs with deep inspiration or coughing; denies pain at this time, however. Pt educated to report pain early to best control it.   Goal: Knowledge of pain management methods  Outcome: Met  Pt educated on prn pain medications available, pain medication schedule, and the need to report pain to RN early to best control pain. Pt verbalizes understanding.

## 2009-07-13 NOTE — Interdisciplinary (Signed)
 Late note: 0130 Pt arrived from ER via stretcher.  Pt able to ambulate from stretcher to bed independently with steady gait.  Pt noted to have slight dyspnea with exertion. Room air sats 88%.  02 at 2L NC continued.  Husband at bedside.  Pt awake, alert, oriented x3.  Denying acute pain or increased SOB from baseline presentation.  Cardiac rhythm NSR.  VSS.  Call placed to Pharmacy.  In process of obtaining Flolan orders from Dr. Paulla Fore.  Pt arrived with flolan infusing at home dosing.  Upon arrival, settings checked and verified pump running.  0230: Pt Flolan cartridge changed, verified with second RN.  Hickman cathater insertion site to L chest CDI.  Pt instructed on fall precautions, instructed to call for any oob assist.  SCD's placed.  Call bell in reach.  Bed in low, locked position.  Second PIV present.

## 2009-07-13 NOTE — ED Notes (Addendum)
=================================   ORDERS ==================================    ACT- MD    MD   RN   AP                                           IVE DATE  TIME TIME TIME  TREATMENT ORDERS                       MD   RN     ---------------------------------------------------------------------------

## 2009-07-13 NOTE — Interdisciplinary (Signed)
 VSS.  No acute c/o.  OOB to bathroom to have BM.  AM labs drawn. Second PIV started with blue hub only, no extension.  No acute change to assessment.

## 2009-07-13 NOTE — ED Notes (Addendum)
 ============================== ADMIT SUMMARY ==============================    RECEIVING NURSE - DONNA, RN  ED NURSE - Babs Bertin, RN    +------------------------------- ALLERGIES -------------------------------+   Sallye Ober Intolerance (10/10/2006); Allopurinol-Unknown Reaction   (09/08/2008); Sulfonamides-All other allergies: Food, Unknown,   miscellaneous (dust, dye, etc.) (09/08/2008);     +-------------------------- ADMITTING DIAGNOSIS --------------------------+   pneumonia    +--------------------------- ADMITTING SERVICE ---------------------------+  ADMISSION SERVICE - PULMONARY/TH  LEVEL OF CARE - Floor  ATTENDING - pach  RESIDENT - MIZZELL, JOSEPH    +------------------------ MOST RECENT VITAL SIGNS ------------------------+  BP - 116/52 PULSE - 86   RESPIRATIONS - 16 O2 SAT - 94   TEMPERATURE - 99.0 MODE - Oral   GCS TOTAL - 15   PAIN - 4 PAIN QUALITY - [facility]   PAIN LOCATION - LT RIBS   DATE/TIME - 07/13/2009 0030    +-------------------------------- FLUIDS ---------------------------------+  DATE TIME IV FLUID L/R LOCATION SIZE HUNG ABSORBED  ---------------------------------------------------------------------------    06/30 2258 NS with Med Left Hand 20 50 ml 0 ml  06/30 2314 NS with Med Left Hand 20 0 ml 50 ml  06/30 2314 D5W with Med Left Hand 20 250 ml 0 ml  07/01 0016 NS with Med Left Hand 20 0 ml 250 ml   TOTAL IV: 300 ml    TOTAL OUTPUT: 0 ml TOTAL PO: 0 ml    +------------------------------ MEDICATIONS ------------------------------+  DATE TIME MEDICATION VERIFYING RN RN INIT  ---------------------------------------------------------------------------    06/30 2135 Acetaminophen 650 MG PO-(TAB) DXF  06/30 2258 Ceftriaxone Sodium 1000 MG DXF   IV-(INFUSION)  06/30 2314 Azithromycin 500 MG IVPB DXF    +------------------------------- LABS DONE -------------------------------+  ACT- MD MD RN AP +INITIALS+  IVE DATE TIME TIME TIME TREATMENT ORDERS MD RN AP    ---------------------------------------------------------------------------     06/30 2108 2129 2109 Culture Blood FXM LJB SXJ   Collect New Specimen   Specimen Type: Blood   Specimen Site: left FA   06/30 2108 2129 2109 Sepsis Panel (BMP, , Mg, Phos, FXM LJB SXJ   CBC+diff), Lactic Acid   Collect New Specimen   Specimen Type: Blood   06/30 2108 2129 2109 Culture Blood FXM LJB SXJ   Collect New Specimen   Specimen Type: Blood   Specimen Site: RT A/C   06/30 2108 2129 2109 Liver Panel, Lipase FXM LJB SXJ   Collect New Specimen   Specimen Type: Blood   06/30 2108 2129 2109 Urinalysis, Clean Catch FXM LJB SXJ   Collect New Specimen   Specimen Type: Urine   06/30 2342 2342 2347 PTT, PT jm DXF SXJ   Specimen Type: Blood    EKG DONE - NO    +---------------------------- PROCEDURE NOTES ----------------------------+      +------------------------ CURRENT MEDICATION --------------------------+     FOLIC ACID 400 Micrograms QD for Continuous   POTASSIUM CHLORIDE 20 Milli-Equivalents TID for Continuous   DIGOXIN 0.25 Milligrams QD for Continuous   COUMADIN 5 Milligrams QD for Continuous   LASIX 40 Milligrams QHS for Continuous   VIAGRA 80 Milligrams TID for Continuous   LASIX 80 Milligrams QD for Continuous   FLOLAN 80 Milliliters QD for Continuous   ALDACTONE 50 Milliliters BID for Continuous   Vitamin D3 2 UNIT QD for    MedroxyPROGESTERone Acetate 0.5 MG QD for    Divigel MG/0.25GM QHS for   +------------------------ OTHER NURSING PROCEDURES -----------------------+     +--------------------------- PSYCHOSOCIAL NEEDS --------------------------+  +------------------------- BARRIERS TO LEARNING --------------------------+  ASSESSMENT- Assessment Done with Findings of:      BARRIERS-    No Barriers  SUPPORT PERSON-      SPECIAL CONSIDERATIONS-      ============================== TRIAGE RECORD ==============================    CHIEF COMPLAINT- Fever    TIME OF ONSET- 2-3 days : +-STANDING-+ +--SEATED--+  TRIAGE  CATEGORY- 2 : BP PULSE BP PULSE   ROOM- T9 : N/A/N/A N/A 145/77 107  MODE OF ARRIVAL- Car :   IN CUSTODY- No : TEMP MODE O2SAT RESP LMP   PRIVATE MD- N/A : 101.3 Oral 90 16 N/A       :   WORK RELATED INJURY- No : +--GCS--+ +--PUPILS--+  RETURN IN 72 HOURS- None : E V M TOT L R RESPONSE  TRIAGE NURSE- Jocelyn Belleza : 4 5 6 15 4 4  PERRL     IS THIS VISIT RELATED TO ASSAULT- No   IS THIS VISIT RELATED TO DOMESTIC VIOLENCE- No   DOES THIS PATIENT EXPRESS SUICIDAL IDEATION/INTENT- No        PAIN TYPE- V NOW- 5 TOLERABLE AT- 0 QUALITY- [facility]      PAIN LOCATION- L side RADIATES TO- L side   LATEX ALLERGY FORM- N/A LATEX ALLERGY- N/A TETANUS- < 5 years   IMMUNIZATION- UTD PED HEIGHT- N/A WEIGHT- 75.0 KG  ADDITIONAL FORMS- N/A  +------------------------- CARE PRIOR TO ARRIVAL -------------------------+   Tyelnol last night 18:30  +------------------------------- ALLERGIES -------------------------------+    MEDICATION ALLERGY REACTION  ---------------------------------------------------------------------------    QUINOLONES GI Intolerance GI Intolera  Allopurinol Unknown reaction Unknown Rea  Sulfonamides All other allergies: Food, Unknown, Misc All other a    +------------------------------ MEDICATIONS ------------------------------+    MEDICATION NAME DOSAGE FREQUENCY  ---------------------------------------------------------------------------    FOLIC ACID 400 Micrograms Once a Day   POTASSIUM CHLORIDE 20 Milli-Equivalents Every 8 Hours   LASIX 80 Milligrams Once a Day   VIAGRA 80 Milligrams Every 8 Hours   LASIX 40 Milligrams Every Night   COUMADIN 5 Milligrams Once a Day   DIGOXIN 0.25 Milligrams Once a Day   ALDACTONE 50 Milliliters Every 12 Hours  FLOLAN 80 Milliliters Once a Day   Vitamin D3 2 UNIT Once a Day   MedroxyPROGESTERone Acetate 0.5 MG Once a Day   Divigel MG/0.25GM Every Night     +-------------------------- PAST MEDICAL HISTORY -------------------------+     +---------------------------- CURRENT HISTORY  ----------------------------+   Patient w/ hx. of PHTN that has been having SOB, Fever and L side pain   for 2-3 days. Concerned becase of the fever and +CXR done at [facility].   Patient on FLOLAN 29 nanograms/24hrs = 15ml/24hr.    =========================== REASSESSMENT VITALS ===========================   R T M ET    E E O CO2 PU-    S M D O2 ET TY- PIL +---GCS----+   DATE TIME BP HR P P E SAT CO2 PE L R E V M TOT POSITION      ---------------------------------------------------------------------------    06/30 2102 145/77 107 16 101.3 O 90 4 4 4 5 6 15  Seated   06/30 2102 / Standing  06/30 2218 118/58 97 20 100.4 O 97 4 5 6 15  Lying   07/01 0030 116/52 86 16 99.0 O 94 2 NC 4 5 6 15  Lying      +-----------------PAIN-----------------+   T I    Y N T N    P O O I   DATE TIME E W L LOCATION QUALITY RADIATES COMMENT T   ---------------------------------------------------------------------------  06/30 2102 V 5 0 L side [facility] L side Triage JCB  06/30 2102 Triage JCB  06/30 2218 V 4 5 LT RIBS [facility] N/A DXF  07/01 0030 V 4 5 LT RIBS [facility] N/A DXF    ============================= NURSE DISCHARGE =============================    DISCHARGE NURSE- Babs Bertin   DISPOSITION- Admitted to Hospital WITH- Family   ACCOMPANIED BY- RN, ED Tech   EQUIP W/TRANSPORT-    Monitor with defibrilator and pads, Airway / ALS Equip   TIME OF DISPOSITION- 07/13/2009 0054 LEFT ED VIA- Jonni Sanger   TRANSFERRED TO- N/A REASON- N/A   ADMITTED TO- IMU Th ROOM- 207 A      NURSE REPORT TO- DONNA, RN REPORT TIME- 07/01 0054      BELONGINGS- With Patient ENVELOPE NUMBER- N/A   CONDITION ON DISCHARGE- Stable   AFTERCARE PROVIDED WITH- Written and Verbal   WHAT AFTERCARE INSTRUCTIONS WERE GIVEN AND REVIEWED WITH PATIENT  AND/OR FAMILY?- (see EPIC instructions)   PNEUMONIA   IN WHAT LANGUAGE WERE THESE GIVEN?- English OTHER: N/A   TRANSLATED BY- N/A OTHER: N/A   GCS- E: 4 V: 5 M: 6 TOTAL: 15   PAIN LEVEL UPON DISPOSITION- 4 OUT OF 10  WHAT MEDS WERE  PROVIDED FROM DISCHARGE PYXIS?-    NONE   RX TO BE FILLED FOR-    NONE   DID THE PATIENT OR RESPONSIBLE CARE PROVIDER UNDERSTAND THE FOLLOW UP  RECOMMENDATION?- Yes DISPOSITION BY- RN   NURSING LEVEL- 3. ED Stay with Multiple Interactions     +------------------------------- RESTRAINTS ------------------------------+  ALTERNATIVE ATTEMPTS:    -------------------------- RESTRAINT ASSESSMENTS --------------------------  ============================== POINT OF CARE ==============================    OCCULT BLOOD STOOL RESULTS   Norm results neg.  DATE TIME RESULTS DONE BY CONTROL POSTIVE CONTROL NEGATIVE     URINE PREGNANCY TEST   Norm results for non pregnant females neg.  DATE TIME RESULTS DONE BY CONTROL POSTIVE     URINE DIP   Norm results - All neg. with pH 5.0 to 8.0 and urobili 0.2 to 1.0   LEUKO NI- PRO- GLU- URO-

## 2009-07-13 NOTE — Procedures (Signed)
 PERFORMED ON - 07/13/2009 14:30:15;   DONE BY - ZENKUS, BARBARA Chambers;  PROCEDURE - PDP EVALUATION;   MD REQUEST #1: OXYGEN;   REASON NOT STARTED: PATIENT NOT IN ROOM;   ELECTRONIC SIGNATURE DERIVED FROM A SINGLE CONTROLLED ACCESS PASSWORD:   Diana Chambers ; 07/13/2009 14:52:15

## 2009-07-13 NOTE — H&P (Signed)
HISTORY AND PHYSICAL    Attending MD:   Lennie Muckle, MD    Chief Complaint:  Left pleuritic pain      History of Present Illness:     Diana Chambers is a 59 year old female who presented to her NP at [facility] for a regular visit.  She complained of left lower pleuritic chest pain and fever for 2 days.  She had a CXR done there that was concerning for pneumonia and pulmonary edema and was told to come to [facility] for further evaluation.  The pt states that she has had a temperature of 101 for 2 days and pleuritic chest pain with a cough for the same period.  She has had a cough productive of yellow sputum for 2-3 weeks.  She has also had DOE over 2-3 weeks that has become worse over the last 2 days.  She denies n/v, change in vision, new skin rashes, abd pain, dysuria/hematuria, LE pain/swelling.  She has been on no trips or long car rides recently.  She denies erythema, discharge or pain from her tunneled catheter.      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    Allergies:  Allergies   Allergen Reactions    Allopurinol Hives    Ofloxacin Nausea and Vomiting    Levaquin Swelling and Other     Severe joint pain      Sulfa Drugs Unspecified         Medications:  Prescriptions prior to admission   Medication Sig Dispense Refill    Cholecalciferol (VITAMIN D) 1000 UNIT CAPS Take 2,000 Units by mouth daily.        medroxyPROGESTERone (PROVERA) 2.5 MG tablet Take 1.25 mg by mouth daily.        warfarin (COUMADIN) 5 MG tablet Take  by mouth daily. Please Take 5 mg on Tues/Thurs/Sat/Sun and 5mg  tab 1and 1/2 tablet (7.5 mg ) on Mon/Wed/Fri  45 Tab  6    Estradiol (DIVIGEL) 0.1 % GEL As directed          furosemide (LASIX) 80 MG tablet Take 1.5 Tabs by mouth daily.  45 Tab  6    potassium chloride (KLOR-CON M20) 20 MEQ tablet Take 1 Tab by mouth 3 times daily.  90 Tab  6    REVATIO 20 MG TABS Take 80  mg by mouth 3 times daily. Take 4 tablets three times daily         FLOLAN 1.5 MG IV SOLR 29ng/kg/min        DIGOXIN 0.25 MG OR TABS 1 TABLET DAILY        ALDACTONE 50 MG OR TABS one tab twice daily        FOLIC ACID 400 MCG OR TABS 1 TABLET DAILY             Social History:  History   Social History    Marital Status: Married     Spouse Name: N/A     Number of Children: N/A    Years of Education: N/A   Social History Main Topics    Smoking status: Former Smoker -- 0.5 packs/day for 5 years     Quit date: 01/13/1978    Smokeless tobacco: Not on file    Alcohol Use: No    Drug Use: Not on file  Sexually Active: Not on file   Other Topics Concern    Not on file   Social History Narrative    No narrative on file         Family History:  Family History   Problem Relation Age of Onset               Review of Systems:  Pertinent postives & negatives noted in HPI.  11 pt ROS o/w negative.    Physical Exam:  There were no vitals taken for this visit.  General Appearance: healthy, alert, no distress, pleasant affect, cooperative.  Eyes:  conjunctivae and corneas clear. PERRL, EOM's intact. Fundi benign.  Mouth: normal and OP clear.  Neck:  Neck supple. No adenopathy, thyroid symmetric, normal size.  Heart:   s1s2 regular, S3 noted, 2/6 sem best at LLSB  Lungs: rales L lower posterior.  Abdomen: BS normal.  Abdomen soft, non-tender.  No masses or organomegaly.  Extremities:  Digital clubbing, trace LE edema, no presacral edema.    Labs and Other Data:  Lab Results   Component Value Date    NA 134* 07/12/2009    K 4.3 07/12/2009    CL 100 07/12/2009    BICARB 23 07/12/2009    BUN 27* 07/12/2009    CREAT 1.20* 07/12/2009    GLU 103 07/12/2009    [place] 10.4 07/12/2009       Lab Results   Component Value Date    WBC 8.0 07/12/2009    HGB 12.5 07/12/2009    HCT 37.6 07/12/2009    PLT 116* 07/12/2009       Lab Results   Component Value Date    AST 18 07/12/2009    ALT 9 07/12/2009    ALK 75 07/12/2009    TBILI 0.6 07/12/2009    DBILI  0.1 07/12/2009    TP 8.2* 07/12/2009    ALB 3.9 07/12/2009       Lab Results   Component Value Date    INR 2.0 07/12/2009    PTT 36.5* 07/12/2009         Radiology Data:  CXR shows LLL infiltrate best seen on lateral new from may 2011    Assessment and Care Plan:  59 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  Her PAH appears quiescent.    > CAP  Mild hypoxemia, requiring 2 L NC.  Will obtain sputum & blood cultures.  Empiric therapy with ceftriaxone & azithromycin    > PH  Not in exacerbation, pt appears to be euvolemic.  Continue outpt PH meds & diuretics.   - c/w flolan at 29, revatio 80 tid, digoxin 0.25 qday   - c/w lasix 80 qam 40 qpm, aldactone 50 bid   - continue prophylactic warfarin at 5 qday    > menopause  Continue outpt estrogens    F Feeding  Regular diet  A.. Analgesia  Prn apap  S.. Sedation  n/a  T.. Thromboprophylaxis  C/w warfarin  H.. Head-of-bed elevation  n/a  U.. Ulcer prophylaxis  n/a  G.. Glycemic control  Not diabetic  S.. Spontaneous breathing trial  n/a  B Bowel care  Diarrhea from flolan  I. Indewelling catheter removal  Requires tunneled cath for folan  D De-escalation of antibiotics  Pending culture results    The patient was discussed with Dr Paulla Fore.    Code Status:  No orders of the defined types were  placed in this encounter.       Note Author: Tobi Bastos, MD, 07/13/2009, 12:03 AM    Attending Addendum:  Patient was examined with house staff.  Labs, imaging and tests reviewed.  Please see above note for full details and plan.    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  1.Continue Ceftriaxone and Azithro for CAP.  Will add Vanco for MRSA coverage pending culture results.  2.Continue with current PAH medicatons including flolan and sildenafil.  3.Cotinue current diuretic regimen.  4.Continue anti-coagulation with goal INR of 2.   Monitor closely in setting of abx admin.  5.Supplemental O2 for Sat > 92%         .

## 2009-07-14 LAB — PROTHROMBIN TIME, BLOOD
INR: 2.6
PT,Patient: 29.5 s — ABNORMAL HIGH (ref 9.7–12.5)

## 2009-07-14 LAB — CBC WITH DIFF, BLOOD
Abs Eosinophils: 0.1 10*3/uL (ref 0.0–0.5)
Abs Lymphs: 1.5 10*3/uL (ref 0.8–3.1)
Abs Monos: 0.3 10*3/uL (ref 0.2–0.8)
Absolute Neutrophil Count: 3.9 10*3/uL (ref 1.6–7.0)
Eosinophils: 2 % (ref 1–7)
Hct: 34.9 % (ref 34.0–45.0)
Hgb: 11.3 g/dL (ref 11.2–15.7)
Imm Gran %: 1 % (ref 0–1)
Lymphocytes: 25 % (ref 19–53)
MCH: 24.6 pg — ABNORMAL LOW (ref 26.0–32.0)
MCHC: 32.4 % (ref 32.0–36.0)
MCV: 75.9 um3 — ABNORMAL LOW (ref 79.0–95.0)
MPV: 10.2 fL (ref 9.4–12.4)
Monocytes: 6 % (ref 5–12)
Plt Count: 105 10*3/uL — ABNORMAL LOW (ref 160–370)
RBC: 4.6 10*6/uL (ref 3.90–5.20)
RDW: 16.8 % — ABNORMAL HIGH (ref 12.0–14.0)
Segs: 67 % (ref 34–71)
WBC: 5.8 10*3/uL (ref 4.0–10.0)

## 2009-07-14 LAB — COMPREHENSIVE METABOLIC PANEL, BLOOD
ALT (SGPT): 8 U/L (ref 0–33)
AST (SGOT): 10 U/L (ref 0–32)
Albumin: 3.4 g/dL — ABNORMAL LOW (ref 3.5–5.2)
Alkaline Phos: 62 U/L (ref 35–140)
BUN: 26 mg/dL — ABNORMAL HIGH (ref 6–20)
Bicarbonate: 25 mmol/L (ref 22–29)
Bilirubin, Tot: 0.2 mg/dL (ref <1.2)
Calcium: 9.7 mg/dL (ref 8.6–10.5)
Chloride: 105 mmol/L (ref 98–107)
Creatinine: 1.06 mg/dL — ABNORMAL HIGH (ref 0.51–0.95)
Glucose: 99 mg/dL (ref 70–115)
Potassium: 3.6 mmol/L (ref 3.5–5.1)
Sodium: 137 mmol/L (ref 136–145)
Total Protein: 7 g/dL (ref 6.0–8.0)

## 2009-07-14 LAB — GFR: GFR: 53 mL/min

## 2009-07-14 LAB — MRSA SURVEILLANCE CULTURE

## 2009-07-14 MED ORDER — WARFARIN SODIUM 2.5 MG OR TABS
2.5000 mg | ORAL_TABLET | Freq: Every evening | ORAL | Status: DC
Start: 2009-07-15 — End: 2009-07-15

## 2009-07-14 NOTE — Plan of Care (Signed)
 Problem: Breathing Pattern - Ineffective  Goal: Respiratory rate, rhythm and depth return to baseline  Outcome: Not Met  Patient with history of pulmonary hypertension, on sidenafil and Flolan. Patient reports that she does not require oxygen administration at home. Currently, patient with oxygen administration 2 LPM per nasal cannula, spO2=94-95%. Patient denies having any difficulty breathing at rest in bed, but does report having increased shortness of breath when ambulating. Patient does state that she feels her breathing has improved since her admission. Continue monitoring and interventions.

## 2009-07-14 NOTE — Plan of Care (Signed)
 Problem: Falls - Risk of  Goal: Absence of falls  Outcome: Met  Patient is at moderate risk for falls, ambulates with steady gait noted, on Flolan with central line in place. Patient is awake, alert, and oriented to room, unit, RN, and use of call button.  White board updated in room, bed in lowest position, brakes engaged, side rails up x2, nonskid footwear in place, call button and personal items within reach, husband present in room. Fall safety rounds in place, continue with interventions.

## 2009-07-14 NOTE — Procedures (Signed)
 PERFORMED ON - 07/14/2009 09:05:00;   DONE BY - DANG, QUAN;  PROCEDURE - OXYGEN-LOW FLOW;   PROTOCOL DRIVEN: YES-MD INITIATED;   OXYGEN CHECK: DONE;   O2 DEVICE: CANNULA-NASAL;   LITERS/MIN: 2;   ADVERSE REACTIONS: NONE;   ELECTRONIC SIGNATURE DERIVED FROM A SINGLE CONTROLLED ACCESS PASSWORD:   Patrcia Dolly ; 07/14/2009 12:20:39

## 2009-07-14 NOTE — Plan of Care (Signed)
 Problem: Falls - Risk of  Goal: Absence of falls  Outcome: Met  Bed is low to ground, brake is on, and siderails up x2.   Board in room updated, reinforced use of call light and call light is within reach.   Hourly rounds & PRN will be performed for patient and room observation.

## 2009-07-14 NOTE — Plan of Care (Signed)
 Problem: Breathing Pattern - Ineffective  Goal: Respiratory rate, rhythm and depth return to baseline  Outcome: Not Met  Patient up and ambulating to bathroom without oxygen administration.  Following return to bed, spO2 initially 88%.  After 1 minute, spO2 increased to 92% on room air.  Oxygen administration then restarted at 2 LPM with resultant increase in spO2 to 95%.  Continue oxygen administration, continue monitoring.

## 2009-07-14 NOTE — Plan of Care (Signed)
 Problem: Breathing Pattern - Ineffective  Goal: Respiratory rate, rhythm and depth return to baseline  Outcome: Not Met  Patient's respiratory remains unchanged, flolan pump continuous infusion, denies sob, but does c/o doe with activity, 02 sat >94% on 3L 02 NC.   There are no other changes in patient's status since a.m. Assessment, VSS, in no apparent distress.

## 2009-07-14 NOTE — Progress Notes (Signed)
 PCCM Fellow Progress Note    Subjective:  Minimal cough.  Pt wants to ambulate today.  No fever/chills.  Good appetite        Objective:  Vital Signs:  BP  Min: 114/61  Max: 137/71  Temp  Avg: 98.3 F (36.8 C)  Min: 97.6 F (36.4 C)  Max: 98.8 F (37.1 C)  Pulse  Avg: 85.7   Min: 80   Max: 91   Resp  Avg: 17.7   Min: 16   Max: 18   SpO2  Avg: 93.9 %  Min: 92 %  Max: 95 %      Yesterday's intake and output:  In: 1873 [P.O.:1070; I.V.:803]  Out: 1920 [Urine:1920]    Weights (last 14 days)    Date/Time Weight Who    07/13/09 0124 165 lb 12.6 oz (75.2 kg) FA                     Drips:       . sodium chloride     . epoprostenol (FLOLAN) infusion 29 ng/kg/min (07/14/09 1356)           Physical Exam:  Nad  Op clear, perrl, eomi  s1s2 regular, no mrg  Left basilar crackles  abd soft, nt, nd  Trace le edema  Nonfocal neuroexam      Laboratory Data:  Lab Results   Component Value Date    WBC 5.8 07/14/2009    HGB 11.3 07/14/2009    HCT 34.9 07/14/2009    PLT 105 07/14/2009    PLT 179 09/12/2008       Lab Results   Component Value Date    NA 137 07/14/2009    K 3.6 07/14/2009    CL 105 07/14/2009    BICARB 25 07/14/2009    BUN 26 07/14/2009    CREAT 1.06 07/14/2009    GLU 99 07/14/2009    [place]odson 9.7 07/14/2009    MG 2.2 07/13/2009    PHOS 3.6 07/13/2009       Lab Results   Component Value Date    TP 7.0 07/14/2009    ALB 3.4 07/14/2009    AST 10 07/14/2009    ALT 8 07/14/2009    ALK 62 07/14/2009    TBILI 0.2 07/14/2009       Lab Results   Component Value Date    PT 29.5 07/14/2009    PTT 36.5 07/12/2009    INR 2.6 07/14/2009       No results found for this basename: ARTPH, ARTPCO2, ARTPO2               Imaging:  No new    Assessment/Plan:  59 yo woman with CAP & stable PH    > CAP  Overall improving.  No new culture data   - continue ceftriaxone &  azith   - pt still requiring home O2   - will d/c vanco today    > PH  Quiescent   - continue flolan unchanged   - continue 80 tid of revatio    > dispo  Awaiting cultures to narrow abx, o/w pt is approaching  discharge        The patient was seen & discussed with Dr Blanche East        Scheduled       . spironolactone  50 mg BID   . cholecalciferol  2,000 Units Daily   . digoxin  250 mcg QPM   .  folic acid  500 mcg Daily   . furosemide  80 mg QAM   . medroxyPROGESTERone  1.25 mg Daily   . sildenafil  80 mg TID   . warfarin  5 mg QPM   . sodium chloride  3 mL Q8H   . cefTRIAXone (ROCEPHIN) IVPB  2,000 mg Q24H NR   . azithromycin (ZITHROMAX) IVPB  500 mg Q24H NR   . PROSTACYCLIN CASSETTE CHANGE   Daily   . Prostacyclin Ice Pack   Q6H   . Prostacyclin Tubing   Three Times Weekly   . Prostacylin Batteries   Weekly   . furosemide  40 mg QPM   . vancomycin (VANCOCIN) IVPB  750 mg Q12H NR       IV       . sodium chloride     . epoprostenol (FLOLAN) infusion 29 ng/kg/min (07/14/09 1356)       PRN       . sodium chloride  3 mL PRN   . sodium chloride   Continuous PRN   . acetaminophen  650 mg Q4H PRN   . aluminum-magnesium-simethicone  30 mL Q6H PRN   . ondansetron  4 mg Q6H PRN       Pulmonary Vascular Attending Addendum      Patient seen and examined with Dr. Reuel Derby.      In good spirits; no fevers, no cough, less dyspnea, still with some LLL pleuritic pain      O2 sat 95 % on 2 liters; coarse crackles LLL, partial clearing with deep breath; no S3, loud P2        PAH with  Comm acquired PNA  (VS HCAP), clinically improving on broad spectrum antibxs; cultures nondirective; to D/C Vanco (no e/o MRSA) and continue Azithro, and Ceftriaxone for now.    Tawni Pummel. Redell Nazir, MD

## 2009-07-14 NOTE — Plan of Care (Signed)
 Problem: Pain - Acute  Goal: Control of acute pain  Outcome: Not Met  Patient reports having sharp, pleuritic left chest pain 5/10 on numerical scale, which is worsened when she is right-side lying or during deep inspiration while ambulating. Patient requesting acetaminophen for pain.  PO acetaminophen administered per PRN order for pain. Monitoring patient response to medication administration.

## 2009-07-14 NOTE — Plan of Care (Signed)
 Problem: Breathing Pattern - Ineffective  Goal: Respiratory rate, rhythm and depth return to baseline  Outcome: Not Met  Patient denies sob, has labored respiration with activity, 02 sat 94% on 3L NC, ambulates in room to restroom in no distress.   There are no other changes in patient's status since a.m. Assessment, VSS.

## 2009-07-14 NOTE — Plan of Care (Signed)
 Problem: Infection  Goal: Absence of infection signs and symptoms  Outcome: Met  Patient denies having any fever, chills, sweats, dizziness, nausea, or vomiting. Blood cultures show now growth after 24 hours, MRSA culture pending. Unable to obtain respiratory sputum culture as patient does not have productive cough. IV antibiotics administered as ordered. WBC=5.3. Standard precautions in place. Continue interventions and monitoring.    Problem: Pain - Acute  Goal: Control of acute pain  Outcome: Met  Patient denies having any pain, declines need for pain medications. Patient does report having chest discomfort with deep inspiration, declines need for pain medication. Continue interventions and monitoring.

## 2009-07-14 NOTE — Procedures (Signed)
 PERFORMED ON - 07/14/2009 09:10:00;   DONE BY - DANG, QUAN;  PROCEDURE - PDP EVALUATION;   NEURO STATUS: AWAKE;   HEARTRATE: 81;   RESP RATE: 15;   BREATH SOUNDS: DIMINISHED, SOME CRACKLES;   CODE STATUS CHECKED: YES;   MD REQUEST #1: OXYGEN;   RCP RECOMMENDATION: #1: O2 PROTOCOL;   INDICATION: #1: O: TO KEEP SA02 >92;   EVALUATION OUTCOME: #1: AS RCP RECOMMENDED;   ELECTRONIC SIGNATURE DERIVED FROM A SINGLE CONTROLLED ACCESS PASSWORD:   Patrcia Dolly ; 07/14/2009 12:20:48

## 2009-07-15 LAB — CBC WITH DIFF, BLOOD
Abs Eosinophils: 0.1 10*3/uL (ref 0.0–0.5)
Abs Lymphs: 1.4 10*3/uL (ref 0.8–3.1)
Abs Monos: 0.3 10*3/uL (ref 0.2–0.8)
Absolute Neutrophil Count: 3.5 10*3/uL (ref 1.6–7.0)
Eosinophils: 1 % (ref 1–7)
Hct: 35.2 % (ref 34.0–45.0)
Hgb: 11.5 g/dL (ref 11.2–15.7)
Imm Gran %: 1 % (ref 0–1)
Lymphocytes: 26 % (ref 19–53)
MCH: 24.8 pg — ABNORMAL LOW (ref 26.0–32.0)
MCHC: 32.7 % (ref 32.0–36.0)
MCV: 76 um3 — ABNORMAL LOW (ref 79.0–95.0)
MPV: 10.2 fL (ref 9.4–12.4)
Monocytes: 5 % (ref 5–12)
Plt Count: 132 10*3/uL — ABNORMAL LOW (ref 160–370)
RBC: 4.63 10*6/uL (ref 3.90–5.20)
RDW: 16.6 % — ABNORMAL HIGH (ref 12.0–14.0)
Segs: 67 % (ref 34–71)
WBC: 5.3 10*3/uL (ref 4.0–10.0)

## 2009-07-15 LAB — PROTHROMBIN TIME, BLOOD
INR: 3.1
PT,Patient: 34.9 s — ABNORMAL HIGH (ref 9.7–12.5)

## 2009-07-15 LAB — COMPREHENSIVE METABOLIC PANEL, BLOOD
ALT (SGPT): 7 U/L (ref 0–33)
AST (SGOT): 9 U/L (ref 0–32)
Albumin: 3.6 g/dL (ref 3.5–5.2)
Alkaline Phos: 66 U/L (ref 35–140)
BUN: 27 mg/dL — ABNORMAL HIGH (ref 6–20)
Bicarbonate: 25 mmol/L (ref 22–29)
Bilirubin, Tot: 0.2 mg/dL (ref <1.2)
Calcium: 10 mg/dL (ref 8.6–10.5)
Chloride: 106 mmol/L (ref 98–107)
Creatinine: 1.01 mg/dL — ABNORMAL HIGH (ref 0.51–0.95)
Glucose: 90 mg/dL (ref 70–115)
Potassium: 3.5 mmol/L (ref 3.5–5.1)
Sodium: 139 mmol/L (ref 136–145)
Total Protein: 7.3 g/dL (ref 6.0–8.0)

## 2009-07-15 LAB — GFR: GFR: 56 mL/min

## 2009-07-15 MED ORDER — POTASSIUM CHLORIDE CRYS CR 10 MEQ OR TBCR
40.00 meq | EXTENDED_RELEASE_TABLET | Freq: Once | ORAL | Status: AC
Start: 2009-07-15 — End: 2009-07-15
  Administered 2009-07-15: 40 meq via ORAL
  Filled 2009-07-15: qty 4

## 2009-07-15 MED ORDER — AZITHROMYCIN 250 MG OR TABS
500.0000 mg | ORAL_TABLET | Freq: Every day | ORAL | Status: DC
Start: 2009-07-16 — End: 2009-07-16
  Administered 2009-07-16: 500 mg via ORAL
  Filled 2009-07-15: qty 2

## 2009-07-15 NOTE — Plan of Care (Signed)
 Problem: Tissue Perfusion - Cardiopulmonary, Altered  Goal: Hemodynamic stability  Outcome: Met  Tele sr-st, alert/oriented/appropriate skin warm and dry. Pulses palpable. bp 129/68. Pulse 90-100's. No c/o chest pain, slight sob with activity. Potassium 3.5, md paged re: on 120 mg oral lasix. H&h 11.5/35.2, dedicated hickman for flolan checked with off going rn and checked with another rn when cassette changed. Received 40 meq oral potassium.skin flushed from flolan.

## 2009-07-15 NOTE — Procedures (Signed)
 PERFORMED ON - 07/15/2009 08:05:04;   DONE BY - PAGE, DAVID;  PROCEDURE - OXYGEN-LOW FLOW;   PROTOCOL DRIVEN: YES-MD INITIATED;   OXYGEN CHECK: DONE;   ADVERSE REACTIONS: NONE;   ELECTRONIC SIGNATURE DERIVED FROM A SINGLE CONTROLLED ACCESS PASSWORD:   PAGE, DAVID ; 07/15/2009 08:56:04

## 2009-07-15 NOTE — Plan of Care (Signed)
 Problem: Breathing Pattern - Ineffective  Goal: Respiratory rate, rhythm and depth return to baseline  Outcome: Met  Lungs diminished with crackles in the bases. On room air sat >92% even after ambulation. Min sob with activity. Encouraged activity and use of i.s approx 1000.  Goal: Arterial blood gas values and/or saturation return to baseline  Outcome: Met  Sat > 92% on room air.

## 2009-07-15 NOTE — Interdisciplinary (Signed)
 Report received from off going RN.  Plan of care for shift discussed with pt - monitor VS/I&O, monitor resp status.  Pt verbalized understanding.  VSS.  SpO2 96% on RA.  Pt denies SOB or pain at this time.  Slight crackles heard in lung bases bilat.  Pt's husband at bedside.  Will cont to monitor.

## 2009-07-15 NOTE — Procedures (Signed)
 PERFORMED ON - 07/15/2009 08:05:16;   DONE BY - PAGE, DAVID;  PROCEDURE - PDP RE-EVALUATION;   NEURO STATUS: AWAKE;   HEARTRATE: 87;   RESP RATE: 18;   BREATH SOUNDS: CLEAR, DIM BASES;   HEMOGLOBIN: 11.5;   SOB: NO;   CHEST EXCURSION: SYMMETRICAL;   PRE-TITR FIO2 OR L/M: 2;   PRE-TITR SAO2: 95;   TITRATED FIO2 OR L/M: ROOM AIR;   POST TITRATION SAO2: 93;   MD REQUEST #1: O2 PROTOCOL;   RCP RECOMMENDATION: #1: O2 PROTOCOL;   INDICATION: #1: O: TO KEEP SA02 >92;   EVALUATION OUTCOME: #1: AS RCP RECOMMENDED;   ADVERSE REACTIONS: NONE;   ELECTRONIC SIGNATURE DERIVED FROM A SINGLE CONTROLLED ACCESS PASSWORD:   PAGE, DAVID ; 07/15/2009 08:56:16

## 2009-07-15 NOTE — Progress Notes (Signed)
 Pulmonary Vascular Attending        Doing well without fevers, no significant cough, no pleuritic CP today            Afebrile, BP 128/64, HR 80s, reg. O2 sat. 94% RA      Decreased density LLL rales this AM; pleural rub not discernible; right lung clear       Normal S1, S2 with P2 accentuation ; soft systolic murmur, no S3      Skin rubor (usual Flolan rash); no LE edema    Sputum gram stain from yesterday with PMNs and primarily GPCs  CXR with suggestion LLL infiltrate; no significant change c/t 07/12/09    BUN 27, Creat 1.01  WBC 5.3, Hct 35.2, Platelets 132,000  INR 3.1    59 yo female with longstanding PAH on sildenafil and Flolan; CAP, clinically improving       Will further taper antibxs to Azithromycin, in anticipation of discharging her on this medication tomorrow if she continues to improve.    Tawni Pummel. Ryan Palermo, MD

## 2009-07-15 NOTE — Plan of Care (Signed)
 Problem: Falls - Risk of  Goal: Absence of falls  Outcome: Met  Frequent rounding maintained. Gait steady, no c/o dizziness/weakness. Non skid slippers    Problem: Infection  Goal: Absence of infection signs and symptoms  Outcome: Met  Afebrile.. Wbc 5.3, dressing dry and intact. On antibiotics.     Problem: Infection - Risk of, Central Venous Catheter-Associated Bloodstream Infection  Goal: Absence of infection signs and symptoms  Outcome: Met  No s/s of infection    Problem: Discharge Planning  Goal: Participation in care planning  Outcome: Met  Plan is d/c tomorrow. Cooperative in plan of care, supportive husband. On room air, ambulated in halls and sat rechecked sat > 93%, min sob with activity.   Goal: Verbalizes/demonstrates knowledge of discharge instructions  Outcome: Not Met  No d/c date has been written, probably d/c tomorrow.  Goal: Verbalize knowledge of prescribed medication management plan  Outcome: Met  No c/o pain    Problem: Pain - Acute  Goal: Control of acute pain  Outcome: Met  No c/o pain  Goal: Response to pain management methods  Outcome: Met  Has not required any prn pain meds

## 2009-07-15 NOTE — Plan of Care (Signed)
 Problem: Breathing Pattern - Ineffective  Goal: Respiratory rate, rhythm and depth return to baseline  Outcome: Met  Reports being on room air and satting 92-93%  At home. Noted Goeing to the bathroom without oxygen and satting 90-92%. Encouraged to keep oxygen . With 1 litre oxygen saturation are 93-94%.denies SOB, slept good with even,regular breathing.denies chest pain, pressure.receiving flolan thru dedicated line via lt. Chest hickman at 29 ng/kg/min.2ml/24hours.icepack changed q 6 hours and pump checked for run mode. Spare pump with batteries at bedside. Backup peripheral line patent on rt.wrist. Skin is flushed .    Problem: Falls - Risk of  Goal: Absence of falls  Outcome: Met  Moderate fall risk on assessment r/t meds. Noted steady gait while went to the bathroom. Husband in room all the time. Alert and orient. Call light, bedside table at reach all time. Non skid sock on. White board updated with fall risk . Bed in low and locked position. No fall or injury . Hourly rounds in progress.    Problem: Infection  Goal: Absence of infection signs and symptoms  Outcome: Met  Vital signs monitored. Denies headache, pain, n/v, chills.hickman site c/d/i. periperal i/v on rt. Arm and lt. Arm are patent and clear with c/d/i dressing. receiving antibiotics q 24 hours  day. Refer doc. Flow sheet for VS.    Problem: Pain - Acute  Goal: Control of acute pain  Outcome: Met  Assessed pain q4 hours and PRN. Deneing any pain or discomfort. Discussed pain management with available pain meds.slept good with even, regular breathing. Refer doc flow sheet for pain assessments.will continue to monitor.

## 2009-07-16 ENCOUNTER — Encounter (INDEPENDENT_AMBULATORY_CARE_PROVIDER_SITE_OTHER): Payer: Self-pay | Admitting: Pulmonary Medicine

## 2009-07-16 DIAGNOSIS — J189 Pneumonia, unspecified organism: Secondary | ICD-10-CM

## 2009-07-16 LAB — COMPREHENSIVE METABOLIC PANEL, BLOOD
ALT (SGPT): 8 U/L (ref 0–33)
AST (SGOT): 11 U/L (ref 0–32)
Albumin: 3.5 g/dL (ref 3.5–5.2)
Alkaline Phos: 70 U/L (ref 35–140)
BUN: 21 mg/dL — ABNORMAL HIGH (ref 6–20)
Bicarbonate: 25 mmol/L (ref 22–29)
Bilirubin, Tot: 0.2 mg/dL (ref <1.2)
Calcium: 10 mg/dL (ref 8.6–10.5)
Chloride: 104 mmol/L (ref 98–107)
Creatinine: 0.94 mg/dL (ref 0.51–0.95)
Glucose: 86 mg/dL (ref 70–115)
Potassium: 3.7 mmol/L (ref 3.5–5.1)
Sodium: 137 mmol/L (ref 136–145)
Total Protein: 7.3 g/dL (ref 6.0–8.0)

## 2009-07-16 LAB — CBC WITH DIFF, BLOOD
Abs Eosinophils: 0.1 10*3/uL (ref 0.0–0.5)
Abs Lymphs: 1.7 10*3/uL (ref 0.8–3.1)
Abs Monos: 0.2 10*3/uL (ref 0.2–0.8)
Absolute Neutrophil Count: 2.8 10*3/uL (ref 1.6–7.0)
Eosinophils: 2 % (ref 1–7)
Hct: 36.5 % (ref 34.0–45.0)
Hgb: 11.8 g/dL (ref 11.2–15.7)
Lymphocytes: 35 % (ref 19–53)
MCH: 24.4 pg — ABNORMAL LOW (ref 26.0–32.0)
MCHC: 32.3 % (ref 32.0–36.0)
MCV: 75.4 um3 — ABNORMAL LOW (ref 79.0–95.0)
MPV: 10.3 fL (ref 9.4–12.4)
Monocytes: 5 % (ref 5–12)
Plt Count: 151 10*3/uL — ABNORMAL LOW (ref 160–370)
RBC: 4.84 10*6/uL (ref 3.90–5.20)
RDW: 16.3 % — ABNORMAL HIGH (ref 12.0–14.0)
Segs: 58 % (ref 34–71)
WBC: 4.8 10*3/uL (ref 4.0–10.0)

## 2009-07-16 LAB — LEGIONELLA ANTIGEN, URINE: Legionella Ag Ur: NEGATIVE

## 2009-07-16 LAB — GFR: GFR: >60 mL/min

## 2009-07-16 LAB — PROTHROMBIN TIME, BLOOD
INR: 3
PT,Patient: 33.5 s — ABNORMAL HIGH (ref 9.7–12.5)

## 2009-07-16 MED ORDER — AZITHROMYCIN 500 MG OR TABS
500.00 mg | ORAL_TABLET | Freq: Every day | ORAL | Status: AC
Start: 2009-07-16 — End: 2009-07-20

## 2009-07-16 MED ORDER — CEFPODOXIME PROXETIL 200 MG OR TABS
200.00 mg | ORAL_TABLET | Freq: Two times a day (BID) | ORAL | Status: AC
Start: 2009-07-16 — End: 2009-07-26

## 2009-07-16 NOTE — Interdisciplinary (Signed)
 Pt resting in bed in no apparent distress.  VSS.  Pt denies SOB but does have a non-productive cough.  SpO2 94% on RA.  Labs drawn/sent.  Will cont to monitor.

## 2009-07-16 NOTE — Interdisciplinary (Signed)
 Received report from night shift RN, Tess, and resumed plan of care. Pt resting comfortably at this time, denies any pain. Pt ambulating several laps in hallway, slow/steady gait noted. VSS, SR on monitor, afebrile. Flolan pump double checked with off going RN. Pt demonstrates understanding and knowledge of Flolan use and pump. Husband at bedside. No needs/concerns at this time. Will continue to monitor.

## 2009-07-16 NOTE — Interdisciplinary (Signed)
 Pt sitting in chair in no apparent distress.  Husband at bedside.  Pt denies any complaints at this time.  VSS.  Will cont to monitor.

## 2009-07-16 NOTE — Discharge Instructions (Signed)
 Stop taking warfarin until you have finished your antibiotics. Then restart at normal dose.

## 2009-07-16 NOTE — Progress Notes (Signed)
 PCCM Fellow Progress Note    Subjective:  Minimal cough.  Ambulating.  Feels well, pleuritic pain improving.  No fever/chills.  Good appetite        Objective:  Vital Signs:  BP  Min: 114/61  Max: 137/71  Temp  Avg: 98.3 F (36.8 C)  Min: 97.6 F (36.4 C)  Max: 98.8 F (37.1 C)  Pulse  Avg: 85.7   Min: 80   Max: 91   Resp  Avg: 17.7   Min: 16   Max: 18   SpO2  Avg: 93.9 %  Min: 92 %  Max: 95 %      Yesterday's intake and output:  In: 480 [P.O.:480]  Out: 3950 [Urine:3950]    Weights (last 14 days)    Date/Time Weight Who    07/13/09 0124 165 lb 12.6 oz (75.2 kg) FA                     Drips:       . sodium chloride     . epoprostenol (FLOLAN) infusion 29 ng/kg/min (07/16/09 0708)           Physical Exam:  Nad  Op clear, perrl, eomi  s1s2 regular, no mrg  Left basilar crackles  abd soft, nt, nd  Trace le edema  Nonfocal neuroexam      Laboratory Data:  Lab Results   Component Value Date    WBC 4.8 07/16/2009    HGB 11.8 07/16/2009    HCT 36.5 07/16/2009    PLT 151 07/16/2009    PLT 179 09/12/2008       Lab Results   Component Value Date    NA 137 07/16/2009    K 3.7 07/16/2009    CL 104 07/16/2009    BICARB 25 07/16/2009    BUN 21 07/16/2009    CREAT 0.94 07/16/2009    GLU 86 07/16/2009     10.0 07/16/2009    MG 2.2 07/13/2009    PHOS 3.6 07/13/2009       Lab Results   Component Value Date    TP 7.3 07/16/2009    ALB 3.5 07/16/2009    AST 11 07/16/2009    ALT 8 07/16/2009    ALK 70 07/16/2009    TBILI 0.2 07/16/2009       Lab Results   Component Value Date    PT 33.5 07/16/2009    PTT 36.5 07/12/2009    INR 3.0 07/16/2009       No results found for this basename: ARTPH,  ARTPCO2,  ARTPO2               Imaging:  No new    Assessment/Plan:  59 yo woman with CAP & stable PH    > CAP  Overall improving.  resp cx negative, blood cx negative   - on oral  azith   - pt off O2    > PH  Quiescent   - continue flolan unchanged   - continue 80 tid of revatio    > dispo  pt is approaching discharge        The patient was seen & discussed with Dr Paulla Fore        Scheduled        . azithromycin  500 mg Daily   . potassium chloride  40 mEq Once   . DISCONTD: warfarin  2.5 mg QPM   . spironolactone  50 mg BID   .  cholecalciferol  2,000 Units Daily   . digoxin  250 mcg QPM   . folic acid  500 mcg Daily   . furosemide  80 mg QAM   . medroxyPROGESTERone  1.25 mg Daily   . sildenafil  80 mg TID   . sodium chloride  3 mL Q8H   . PROSTACYCLIN CASSETTE CHANGE   Daily   . Prostacyclin Ice Pack   Q6H   . Prostacyclin Tubing   Three Times Weekly   . Prostacylin Batteries   Weekly   . furosemide  40 mg QPM   . DISCONTD: cefTRIAXone (ROCEPHIN) IVPB  2,000 mg Q24H NR   . DISCONTD: azithromycin (ZITHROMAX) IVPB  500 mg Q24H NR       IV       . sodium chloride     . epoprostenol (FLOLAN) infusion 29 ng/kg/min (07/16/09 0708)       PRN       . sodium chloride  3 mL PRN   . sodium chloride   Continuous PRN   . acetaminophen  650 mg Q4H PRN   . aluminum-magnesium-simethicone  30 mL Q6H PRN   . ondansetron  4 mg Q6H PRN

## 2009-07-16 NOTE — Interdisciplinary (Signed)
 Orders received for discharge home. Pt's flolan cassette, tubing, batteries, ice packs changed at this time. Dr. Paulla Fore has written pt's prescriptions so that they can be filled at outpatient pharmacy. Pt's peripheral IV dc'd at this time, cath of tip intact, hemostasis obtained. VSS. Pt assisted getting dressed. Reviewed discharge instructions with pt, no questions/concerns. Pt ambulating off unit at this time with spouse.

## 2009-07-16 NOTE — Progress Notes (Signed)
 Entered abx

## 2009-07-16 NOTE — Interdisciplinary (Signed)
 Pt asleep in bed in no apparent distress.  Ice pack for Flolan changed per orders.  VSS.  Will cont to monitor.

## 2009-07-16 NOTE — Interdisciplinary (Signed)
Pt asleep in bed in no apparent distress.  VSS.  Will cont to monitor.

## 2009-07-17 LAB — RESPIRATORY CULTURE W/GRAM STAIN: Respiratory Culture: NORMAL — AB

## 2009-07-17 LAB — BLOOD CULTURE
Blood Culture: NO GROWTH
Blood Culture: NO GROWTH

## 2009-07-18 NOTE — Discharge Summary (Signed)
Dictating Practitioner: Kermit Balo. Paulla Fore, M.D.     Staff Physician: Kermit Balo. Paulla Fore, M.D.    Date of Admission: 07/12/2009  Date of Discharge: 07/16/2009        DISCHARGE DIAGNOSIS: Pneumonia.    HISTORY OF PRESENT ILLNESS: The patient is a 59 year old female, with  history of WHO group 1 pulmonary arterial hypertension, currently on Flolan  and sildenafil, admitted on July 12, 2009 to [facility] with signs and symptoms  consistent with community-acquired pneumonia. She was admitted on June 30th  and required the addition of mild amounts of supplemental oxygen up to a  maximum of 2 L/minute via nasal cannula. She was started on  community-acquired coverage for pneumonia with ceftriaxone and  azithromycin.    During her hospitalization she continued to improve. She had decreased  cough, decreased sputum production, increased energy and her oxygen was  ultimately weaned off back to her baseline of not requiring oxygen at rest  or with exercise. She was ultimately discharged home on July 16, 2009 with  an order for 6 more days of oral antibiotics to complete a total 10-day  course for community-acquired pneumonia. Her oral antibiotics included  cefpodoxime 200 mg p.o. b.i.d. as well as azithromycin 500 mg p.o. daily.    PAST MEDICAL HISTORY: Sjogren syndrome, PAH    ALLERGIES  1. ALLOPURINOL  2. OFLOXACIN.  3. LEVAQUIN.  4. SULFA DRUGS.    ADMISSION MEDICATIONS  1. Vitamin D.  2. Medroxyprogesterone 2.5 mg p.o. daily.  3. Coumadin 5 mg p.o. daily.  4. Lasix 120 mg p.o. daily.  5. Potassium chloride 60 mg p.o. daily.  6. Ravadio 80 mg p.o. 3 times daily.  7. Flolan continuous infusion 29 ng/kg per minute.  8. Digoxin 0.25 mg daily.  9. Aldactone 50 mg p.o. b.i.d.  10. Folic acid.    REVIEW OF SYSTEMS: Negative other than specified.    LABORATORY DATA/EKG/X-RAYS: Pertinent studies: Chest x-ray dated  07/12/2009, tunneled single-lumen central venous catheter in left internal  jugular vein. Tip in the proximal right atrium. New  opacity in the left  lower lobe basilar segments consistent with acute airspace disease,  pneumonia and small left pleural effusion.    Chest x-ray dated 07/13/2009, persistent left lower lobe and increasing  lingular opacities, may reflect worsening pneumonia, trace to small left  pleural effusion. Chest x-ray dated 07/15/2009, improvement in bilateral  lower lobe patchy infiltrates.    Respiratory culture July 14, 2009, streptococcus group F.    Legionella antigen urine negative.    DISCHARGE PLAN: The patient will remain on her current pulmonary arterial  hypertension medications including sildenafil 80 mg p.o. t.i.d. as well as  Flolan 29 ng/kg per minute. She will continue on her current dose of  diuretics including Lasix 120 mg p.o. daily, as well as Aldactone 50 mg  p.o. b.i.d. She will complete a course in total 10 days of antibiotics for  her community-acquired pneumonia. She will continue as an outpatient with  azithromycin 500 mg p.o. daily as well as cefpodoxime 200 mg p.o. b.i.d.  While she is taking these antibiotics I have asked her to hold her Coumadin  so as not to increase her INR inappropriately and have asked her to resume  her Coumadin once the course of antibiotics is complete. We directed her to  her follow up in the pulmonary arterial hypertension clinic in  approximately 1 to 2 months. Additionally, we have educated her regarding  an ongoing clinical study we  have for an investigation of compound  cicletanine and she is strongly considering enrolling in this trial.                          Electronically signed by:  Kermit Balo. Paulla Fore, M.D. 07/24/2009 12:02 P    DD: 07/18/2009 DT: 07/18/2009 01:41 P DocNo.: [id number]  [id number].[facility]    Referring Physician:  ED        Primary Care Physician:  [facility] Dmauri Rosenow MD  [address]  [place], [place] [address]    cc:

## 2009-07-23 ENCOUNTER — Encounter (HOSPITAL_COMMUNITY)

## 2009-07-23 ENCOUNTER — Encounter (INDEPENDENT_AMBULATORY_CARE_PROVIDER_SITE_OTHER): Admitting: Pulmonary Medicine

## 2009-10-25 NOTE — Procedures (Signed)
 FINAL        Pulmonary Function Laboratory PATIENT Information    Philis Nettle., Director Last Name:Sabala    [name]    [place], [place] [address] UE#:[id number] BJ#[id number]    Ph: [phone] Visit#:    PATIENT LOCATION:TOP    Technician:LS    REFERRING M.D.: Donnie Coffin       EXERCISE STUDY    (Date format mm/dd/yy hh:mm) DATE OF STUDY:05/15/08 12:05 PM    Test Status - Preliminary or   Final?:F    Age:59 Ht: cm Wt: kg Sex: Purpose:6 Min Walk          Levels 1 to 6       Page 1 58f 1 6 R-E O2 report       Position Stand --- --- --- --- ---      % FIO2 RA RA --- --- --- ---      Speed (MPH) Rest Walk --- --- --- ---      Elevation (%) _ _ _ _ _ _    Duration (min) 5:00 6:00 _ _ _    ARTERIAL BLOOD    GASES    PaO2 (mmHg) _ _ _ _ _ _    PaCO2 (mmHg) _ _ _ _ _ _    PH _ _ _ _ _ _    %O2Hb (Co-oximeter) _ _ _ _ _ _    SpO2 97 90 _ _ _    CARDIOVASCULAR    Heart Rate (bpm) 80 131 _ _    BP systolic (mmHg) _ _ _ _ _ _    BP diastolic (mmHg) _ _ _ _ _ _    PERCEIVED SYMPTOMS (0 - 10)    Fatigue 2 _ _ _    Breathlessness 3 _ _ _          Hgb (gm/dl) COHb (gm%)       Technician Comments: Patient completed protocol and WALKED 510   meters.                        Results of interim report not necessarily reviewed by attending  laboratory Physician.    Measured values are occasionally modified as result of the review   by  the laboratory Physician.

## 2009-10-25 NOTE — Procedures (Signed)
 FINAL        Pulmonary Function Laboratory PATIENT Information    TAgustin Aldo D., Director Last Name:Crandle    [address] First Name:Diana L.    [place], [place] [address] UE#:[id number] BJ#[id number]    Ph: [phone] Visit#:    PATIENT LOCATION:TOP    Technician:AS    REFERRING M.D.: Kendra Pavy, L.       EXERCISE STUDY    (Date format mm/dd/yy hh:mm) DATE OF STUDY:09/28/06 2:15 PM    Test Status - Preliminary or   Final?:F    Age:59 Ht: cm Wt: kg Sex:F Purpose:6 Min Walk          Levels 1 to 6       Page 1 1f 1 [name]       Position Stand --- --- --- --- ---      % FIO2 RA RA --- --- --- ---      Speed (MPH) Rest Walk --- --- --- ---      Elevation (%) _ _ _ _ _ _    Duration (min) 5:00 6:00   _    ARTERIAL BLOOD    GASES    PaO2 (mmHg) _ _ _ _    PaCO2 (mmHg) _ _ _ _          PH _ _ _ _ _    %O2Hb (Co-oximeter) _ _ _ _ _    SpO2 94 93   _    CARDIOVASCULAR    Heart Rate (bpm) 88 130   _    BP systolic (mmHg) _ _ _ _    BP diastolic (mmHg) _ _ _ _    PERCEIVED SYMPTOMS (0 - 10)    Fatigue 2   _    Breathlessness 2.5 _ _          Hgb (gm/dl) COHb (gm%)       Technician Comments:Protocol completed and WALKED 511 Meters.

## 2009-10-25 NOTE — Procedures (Signed)
 FINAL        Pulmonary Function Laboratory PATIENT Information    Reggy Capers D., Director Last Name:Arrellano    [address]    [place], [place] [address] UE#:[id number] BJ#[id number]    Ph: [phone] Visit#:    PATIENT LOCATION:TOP    Technician:A.Rosealee Concha    REFERRING M.D.: Kendra Pavy       EXERCISE STUDY    (Date format mm/dd/yy hh:mm) DATE OF STUDY:11/29/07 12:35 PM    Test Status - Preliminary or   Final?:F    Age:59 Ht:157.00cm Wt: kg Sex:F Purpose:6 Min Walk       Levels 1 to 6       Page 1 105f 1 [id number]       Position Stand --- --- --- --- ---      % FIO2 RA RA --- --- --- ---      Speed (MPH) Rest Walk --- --- --- ---      Elevation (%) _ _ _ _ _ _    Duration (min) 5:00 6:00 _ _ _ _    ARTERIAL BLOOD    GASES    PaO2 (mmHg) _ _ _ _ _ _    PaCO2 (mmHg) _ _ _ _ _ _    PH _ _ _ _ _ _    %O2Hb (Co-oximeter) _ _ _ _ _ _    SpO2 92 91 _ _ _ _    CARDIOVASCULAR    Heart Rate (bpm) 85 133 _ _ _ _    BP systolic (mmHg) _ _ _ _ _ _    BP diastolic (mmHg) _ _ _ _ _ _    PERCEIVED SYMPTOMS (0 - 10)    Fatigue _ 1.0 _ _ _ _    Breathlessness _ 3.0 _ _ _ _          Hgb (gm/dl) COHb (gm%)       Technician Comments:Patient completed protocol and walked 540   meters.

## 2009-10-25 NOTE — Procedures (Signed)
 FINAL        Pulmonary Function Laboratory PATIENT Information    Genetta Kenning., Director Last Name:[place]    [address]    [place], [place] [address] ZH#:[id number] [id number]    Ph: [phone] Visit#:    PATIENT LOCATION:TOP    Technician:BGM    REFERRING M.D.: Kendra Pavy       EXERCISE STUDY    (Date format mm/dd/yy hh:mm) DATE OF STUDY:07/26/07 12:30 PM    Test Status - Preliminary or   Final?:F    Age:59 Ht:157.00cm Wt: kg Sex:F Purpose:6 Min Walk       Levels 1 to 6       Page 1 42f 1 [id number]       Position Stand --- --- --- --- ---      % FIO2 RA RA --- --- --- ---      Speed (MPH) Rest Walk --- --- --- ---      Elevation (%) _ _ _ _ _ _    Duration (min) 5:00 6:00 _ _ _ _    ARTERIAL BLOOD    GASES    PaO2 (mmHg) _ _ _ _ _ _    PaCO2 (mmHg) _ _ _ _ _ _    PH _ _ _ _ _ _    %O2Hb (Co-oximeter) _ _ _ _ _ _    SpO2 96 90 _ _ _ _    CARDIOVASCULAR    Heart Rate (bpm) 84 145 _ _ _ _    BP systolic (mmHg) _ _ _ _ _ _    BP diastolic (mmHg) _ _ _ _ _ _    PERCEIVED SYMPTOMS (0 - 10)    Fatigue _ 2 _ _ _ _    Breathlessness _ 3 _ _ _ _          Hgb (gm/dl) COHb (gm%)       Technician Comments: Patient completed protocol and walked 535   meters.

## 2009-12-24 ENCOUNTER — Ambulatory Visit (HOSPITAL_COMMUNITY): Admitting: Pulmonary Medicine

## 2009-12-24 ENCOUNTER — Other Ambulatory Visit: Payer: Self-pay

## 2009-12-24 ENCOUNTER — Inpatient Hospital Stay (HOSPITAL_COMMUNITY): Admit: 2009-12-24 | Discharge: 2009-12-24

## 2009-12-24 VITALS — BP 127/70 | HR 80 | Temp 98.6°F | Ht 62.0 in | Wt 159.0 lb

## 2009-12-24 LAB — COMPREHENSIVE METABOLIC PANEL, BLOOD
ALT (SGPT): 8 U/L (ref 0–33)
AST (SGOT): 11 U/L (ref 0–32)
Albumin: 4.2 g/dL (ref 3.5–5.2)
Alkaline Phos: 78 U/L (ref 35–140)
BUN: 26 mg/dL — ABNORMAL HIGH (ref 6–20)
Bicarbonate: 24 mmol/L (ref 22–29)
Bilirubin, Tot: 0.3 mg/dL (ref <1.2)
Calcium: 9.9 mg/dL (ref 8.6–10.5)
Chloride: 107 mmol/L (ref 98–107)
Creatinine: 1.02 mg/dL — ABNORMAL HIGH (ref 0.51–0.95)
Glucose: 96 mg/dL (ref 70–115)
Potassium: 4.2 mmol/L (ref 3.5–5.1)
Sodium: 139 mmol/L (ref 136–145)
Total Protein: 8.3 g/dL — ABNORMAL HIGH (ref 6.0–8.0)

## 2009-12-24 LAB — URIC ACID, BLOOD: Uric Acid: 10.3 mg/dL — ABNORMAL HIGH (ref 2.4–5.7)

## 2009-12-24 LAB — PRO BNP, BLOOD: BNPP: 859 pg/mL (ref 0–899)

## 2009-12-24 LAB — PROTHROMBIN TIME, BLOOD
INR: 1.9
PT,Patient: 20.7 s — ABNORMAL HIGH (ref 9.7–12.5)

## 2009-12-24 MED ORDER — WARFARIN SODIUM 5 MG OR TABS
5.00 mg | ORAL_TABLET | Freq: Every day | ORAL | Status: DC
Start: ? — End: 2010-12-18

## 2009-12-24 NOTE — Procedures (Addendum)
 FINAL  [Page 1]                 Pulmonary Function Laboratory   PATIENT Information                   Philis Nettle., Director      Last Name:Keats                 [address]            [place]                 [name], [place] [address]             UE#:[id number]  BJ#[id number]                 Ph: [phone]              PATIENT LOCATION:TOP                                                 Technician:JB                                                 REFERRING M.D.: Poch                                                   DATE OF STUDY:12/24/2009   2:30 PM                                                  Test Status - Preliminary or  Final?:F                 Age:59 Ht:157.00cmWt:73kg       Sex:F Purpose:                    Page 1 39f 1 6 R-E O2 report                     Position                    Speed (MPH)                  Elevation (%)      _       _      _      _       _       _                  Duration (min)     10:00   5:00   6:00   _       _       _                  ARTERIAL BLOOD  GASES                  PaO2 (mmHg)                          _   _       _       _                  PaCO2 (mmHg)                         _   _       _       _                  PH                                   _   _       _       _                  %O2Hb(Co oximeter)          _     _      _       _       _                  SpO2               97      96     91     _       _       _                  CARDIOVASCULAR                  Heart Rate (bpm)   70      77     135                        _                  BP systolic (mmHg) _       _      _      _       _       _                  BP diastolic (mmHg)_       _      _      _       _       _                 PERCEIVED SYMPTOMS  (0 - 10)                  Fatigue            0       0      3  Breathlessness     0       0      3                                                     Hgb (gm/dl)           COHb (gm%)                               Technician Comments:Patient completed protocol and walked   510  meters.                    Interpretation:                   The patient walked 510 meters in 6 minutes.  This is a 2   meter  increase , which                  represents a <1% change from the previous test dated   01/22/2009.  [Page 2]                 [name], MD [facility]

## 2009-12-24 NOTE — Procedures (Signed)
No note

## 2009-12-27 NOTE — Progress Notes (Signed)
This office note has been dictated.

## 2009-12-27 NOTE — Progress Notes (Signed)
CLINIC: [facility]    REPORT TYPE: NOTE    Dictating Practitioner: Kermit Balo. Paulla Fore, M.D.    DATE OF SERVICE:  12/24/2009    REASON FOR VISIT: FOLLOWUP FOR PULMONARY ARTERIAL HYPERTENSION        HISTORY OF PRESENT ILLNESS: 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 She has remained on a  stable dose of Flolan at 29 ng/kg per minute, as well as a stable dose of  Revatio 80 mg p.o. 3 times a day. She reports no episodes of chest pains,  palpitations. No change in her lower extremity edema. She does occasionally  report a tightness sensation around her chest and inability to take a  complete deep breath. She reports occasional sharp pains in her back that  resolve quickly. She reports that her tunneled catheter site looks okay. No  evidence of infection.    She has not yet made the transition from name brand Flolan to room  temperature-stable Veletri and is nervous about making the transition given  her stability on Flolan for so many years.    ALLERGIES  1. ALLOPURINOL.  2. LEVAQUIN.  3. SULFA DRUGS.    MEDICATIONS  1. Coumadin 5 mg p.o. 5 days a week, 2.5 mg 2 days a week.  2. Calcium and vitamin D.  3. Estradiol cream.  4. Lasix 120 mg p.o. daily.  5. Potassium chloride 20 mEq p.o. 3 times a day.  6. Revatio 80 mg p.o. 3 times a day.  7. Flolan continuous infusion 20 ng/kg per minute.  8. Digoxin 0.25 mg daily.  9. Aldactone 50 mg p.o. b.i.d.  10. Folic acid 400 mcg p.o. daily.    DIAGNOSTIC DATA: Six-minute walk test dated 12/24/2009: Saturation at rest  97%, saturation with exercise 91%. Heart rate at rest 70, heart rate with  exercise 135. Total distance walked 510 m. This compares with a prior  distance walked on 10/09/2008 of 529 m.    PHYSICAL EXAMINATION  VITAL SIGNS: Blood pressure 127/70, pulse 80, temperature 98.6.  Weight 159  pounds, height 5 feet 2 inches, saturation 95% on room air.  GENERAL: Awake, alert, oriented, no acute distress. Able to speak in full  sentences.  HEENT: Oropharynx clear.  NECK: Mild elevation of jugular venous distention. No thyromegaly. No  lymphadenopathy.  HEART: Regular rate and rhythm. Loud 2nd heart sound. Positive systolic  murmur.  LUNGS: Clear to auscultation bilaterally. No rales, no wheezes.  ABDOMEN: Soft, nondistended, nontender. Positive bowel sounds.  EXTREMITIES: Trace edema, bilateral lower extremities.  SKIN: Positive Flolan skin changes on chest and lower extremities.  JOINTS: No additional swollen joints.    ASSESSMENT: [name] is a 59 year old female with WHO Group  I pulmonary arterial hypertension related to prior anorexigen use, as well  as the presence of connective tissue disease, who is currently [place]  Heart Association functional class II-III. Her symptoms have been stable  since she was last seen. In addition, her 6-minute walk test is stable.    PLAN: We will plan to continue her on her current dose of pulmonary  vasodilator medications: Lasix 120 mg p.o. daily, Revatio 80 mg p.o.  t.i.d., and Flolan 29 ng/kg per minute. The patient has questions regarding  the transition to [facility], and I have  left it up to her as to whether she  would like to make this transition or not. She is still considering this  and will let us know if she decides to proceed. We will plan to see her  back in clinic in approximately 6 months to repeat a 6-minute walk test and  echocardiogram.                        Electronically signed by:  Kermit Balo. Paulla Fore, M.D. 01/10/2010 02:48 P          DD: 12/27/2009    DT: 12/27/2009 09:18 A   DocNo.: [id number]  [id number]                 [id number]    Referring Physician:  UNKNOWN PHYSICIAN        Primary Care Physician:  [facility] Tel Hevia MD  [address]  [place], [place] [address]    cc:

## 2010-05-06 ENCOUNTER — Encounter (HOSPITAL_COMMUNITY): Payer: Self-pay | Admitting: Pulmonary Medicine

## 2010-06-17 ENCOUNTER — Inpatient Hospital Stay (HOSPITAL_COMMUNITY): Admit: 2010-06-17 | Discharge: 2010-06-17

## 2010-06-17 ENCOUNTER — Encounter (HOSPITAL_COMMUNITY): Payer: Self-pay | Admitting: Pulmonary Medicine

## 2010-06-17 ENCOUNTER — Ambulatory Visit (HOSPITAL_COMMUNITY): Admitting: Pulmonary Medicine

## 2010-06-17 NOTE — Procedures (Addendum)
 FINAL  [Page 1]                       Pulmonary Function Laboratory   PATIENT Information             Philis Nettle., Director      Last Name:Krakow           [address]            First Name:Haelee           [place], [place] [address]             UE#:[id number]  BJ#[id number]            Ph: [phone]              PATIENT LOCATION:[facility]                                           Technician:JIM MURDOCK RRT                                           REFERRING M.D.: Poch                                                                                      EXERCISE STUDY                                           DATE OF STUDY:06/17/2010 11:00 AM                                              Test Status - Preliminary or   Final?:F           Age:60 Ht:          cm Wt:          kg   Sex:F Purpose:6 Min Walk                           Page 1 66f 1 [id number]                      Position           Sitting Stand  ---     ---    ---     ---           % FIO2             RA      RA     RA      ---    ---     ---  Speed (MPH)        Rest    Rest   Walk    ---    ---     ---           Elevation (%)                                                                             Duration (min)     10:00   5:00   6:00                                            ARTERIAL BLOOD                                                      GASES           PaO2 (mmHg)                                                                               PaCO2 (mmHg)                                                                              PH                                                                                        %O2Hb(Co oximeter)                                                                        SpO2               93      92     87  CARDIOVASCULAR                                                      Heart Rate (bpm)   85      88     131                                              BP systolic (mmHg)                                                                        BP diastolic (mmHg)                                                                        PERCEIVED SYMPTOMS (0 - 10)                                         Fatigue                                3.0                                         Breathlessness                         3.0                                                               Hgb (gm/dl)            COHb (gm%)                                  Technician Comments:Patient completed protocol and walked 550   meters.                                              Interpretation:                      The patient walked in  6 minutes. This is a   increase,  which represents a 7% change           from the previous test dated 12/24/09.Arterial saturation, measured   by  pulse oxymetry decreased from           93% to 87% throughout the test.           Aura Fey, MD.

## 2010-06-17 NOTE — Procedures (Signed)
No note

## 2010-06-17 NOTE — Procedures (Addendum)
 Patient Height: 156.0000 cm          Patient Weight: 73.0000 kg  Diana Chambers, M.D., Director Ph. [phone]                                                  Pred Baseline %Pred Post %Pred %Chg                   FVC                [L] 2.77     1.56    56                                   FEV 1              [L] 2.23     1.20    54                                   FEV 1 / FVC        [%] 79.9     76.6    96                                   FEF 25/75        [L/s] 2.33     0.77    33                                   PEF              [L/s] 5.67     3.89    69                                   MEF 50           [L/s] 3.48     1.38    40                                   MEF 25           [L/s] 1.25     0.21    16                                   FIF 50           [L/s] 3.43     2.88    84                                   PIF              [L/s] 3.88     2.93    76  VC                 [L] 2.77     1.56    56                                   FRCpleth           [L] 2.62     2.06    79                                   RV                 [L] 1.86     1.78    96                                   TLC                [L] 4.67     3.35    72                                   RV / TLC           [%] 39.3     53.3   136                                                                                                                R 0.5      [cmH2O*s/L] 3.06     2.90    95                                                                                                                DLCO SB  [ml/min/mmHg] 23.7    11.02    47                                   DLCOc    [ml/min/mmHg] 23.7    11.02    47                                   TLC-SB             [  L] 4.67     2.54    54                                   Hb           [g/113ml]         13.40                                                                                                                       PI MAX         [cmH20]   75       47    63                                   PE MAX         [cmH20]  141       62    44  Test was performed today for the following indication: ILD     Technical comments: All data acceptable and repeatable by  ATS criteria. Best reported spirometry test was acceptable  and repeatable by ATS criteria. Patient gave great effort  and cooperation. Hgb assumed to be 13.4 g/dL for female.     Physician Interpretation:     1. The FVC and the FEV1 are decreased. The findings are  consistent with a restrictive defect.  2. The Vital Capacity and the Total Lung Capacity measured  by plethysmography are redeuced. These finding suggest a  restrictive defect.  3. The airways resistance during quiet breathing is normal.  4. DLCO at an assumedly normal hemoglobin level is  moderately reduced.  5. The Maximum Inspiratory Pressures and Maximum Expiratory  Pressures generated by the patient are within normal  limits.     [name], MD [facility]

## 2010-06-18 ENCOUNTER — Encounter (HOSPITAL_COMMUNITY): Payer: Self-pay | Admitting: Pulmonary Medicine

## 2010-06-18 ENCOUNTER — Encounter (HOSPITAL_COMMUNITY)

## 2010-06-18 NOTE — Progress Notes (Signed)
This office note has been dictated.

## 2010-06-19 ENCOUNTER — Telehealth (INDEPENDENT_AMBULATORY_CARE_PROVIDER_SITE_OTHER): Payer: Self-pay | Admitting: Allergy & Immunology

## 2010-06-19 NOTE — Progress Notes (Signed)
CLINIC: [facility]    REPORT TYPE: NOTE    Dictating Practitioner: Kermit Balo. Paulla Fore, M.D.    DATE OF SERVICE:  06/18/2010    REASON FOR VISIT: FOLLOWUP/PULMONARY ARTERIAL HYPERTENSION        HISTORY OF PRESENT ILLNESS: [name] is a 60 year old  female with a history of WHO Group I pulmonary arterial hypertension  associated with prior diet pill use, as well as the presence of Sjogren  disease, who returns to clinic for followup.  She reports that her  functional status is relatively stable since she was last seen. She reports  no change in her exercise capacity. She reports no change in her lower  extremity edema or abdominal distention, no chest pain, no palpitations, no  syncope or presyncope. She reports all in all she is feeling pretty well.    She continues to use Flolan at the same rate of 29 ng/kg per minute.    REVIEW OF SYSTEMS: Negative, other than specified in HPI.    ALLERGIES: ALLOPURINOL, LEVAQUIN, OFLOXACIN, SULFA DRUGS.    CURRENT MEDICATIONS  1. Coumadin 5 mg alternating with 2.5 mg.  2. Calcium with vitamin D.  3. Lasix 120 mg p.o. daily.  4. Potassium chloride 20 mEq p.o. 3 times a day.  5. Revatio 20 mg tablets 4 tablets or 80 mg p.o. 3 times daily.  6. Flolan continuous infusion 29 ng/kg per minute.  7. Digoxin 0.25 mg p.o. daily.  8. Aldactone 50 mg p.o. b.i.d.  9. Folic acid 400 mcg p.o. daily.    DIAGNOSTIC DATA: 6-minute walk test dated 06/17/2010: Saturation at rest  93%, saturation with exercise 87%. Heart rate at rest 85, heart rate with  exercise 131, total distance walked 515 meters.    Echocardiogram dated 06/17/2010: Normal left ventricular size and systolic  function, mild diastolic dysfunction. Right ventricle is severely enlarged.  Right atrium is moderately enlarged. Trivial pericardial effusion is seen.  Pulmonary artery is enlarged. There is mild aortic regurgitation. Trace  mitral and pulmonic regurgitation. Estimated PA and RV systolic pressure is  80 plus  CVP.    PHYSICAL EXAMINATION  VITAL SIGNS: Blood pressure 119/61, pulse 90, temperature 98.5, weight 160  pounds, height 5 feet 2 inches, saturation 93% on room air.  GENERAL: Awake, alert, oriented, no acute distress. Able to speak in full  sentences.  HEENT: Oropharynx clear.  NECK: Mild elevation of jugular venous distention. No thyromegaly. No  lymphadenopathy. HEART: Regular rate and rhythm, loud second heart sound,  positive systolic murmur.  LUNGS: Rales at bases bilaterally.  ABDOMEN: Soft, nondistended, nontender, positive bowel sounds, no  organomegaly noted. EXTREMITIES: No clubbing, cyanosis, or edema.  SKIN: Positive Flolan rash, no telangiectasias. No digital ulceration.    ASSESSMENT: [name] is a 60 year old female with a history  of WHO Group I pulmonary arterial hypertension associated with prior  anorexigen use, as well as the presence of Sjogren disease. She is  clinically stable with preserved 6-minute walk distance; however, her  echocardiogram does show evidence of a pericardial effusion which is often  a poor prognostic sign in pulmonary hypertension. Her TAPSE however, is  relatively preserved with a number of 2.5 cm. She is currently [place]  Heart Association functional class II.    PLAN: Based on findings of crackles on lung exam, I am concerned that she  may have to have developed interstitial lung disease associated with the  presence of her Sjogren disease, I  would like to:    1. Obtain a chest x-ray.  2. Obtain pulmonary function tests.  3. If either of these tests are abnormal it would suggest the presence of  interstitial lung disease I would like to followup with a high-resolution  CAT scan to look for interstitial lung disease and then refer her to  evaluation by Rheumatology and work with them regarding consideration for  the use of immunosuppression therapy in the presence of new ILD and  autoimmune diseases.  4. In terms her pulmonary hypertension, we will  continue on her current  therapy of IV Flolan and her Revatio 80 mg p.o. t.i.d. as well as her  current dose of diuretics, Lasix 120 mg p.o. daily.  5. We will plan to see her back in clinic in approximately 3 months.  Hopefully, in the interval, she will see Rheumatology.                        Electronically signed by:  Kermit Balo. Paulla Fore, M.D. 06/25/2010 11:18 A          DD: 06/18/2010    DT: 06/19/2010 09:23 A   DocNo.: [id number]  [id number]                 [id number]    Referring Physician:  [facility] Terriann Difonzo MD  [address]  [place] [address]    Primary Care Physician:  [facility] Kienna Moncada MD  [phone]  [place], [place] [address]    cc:

## 2010-06-19 NOTE — Telephone Encounter (Signed)
Dr.Kavanaugh has 10 patients scheduled on monday, 2 are new patients. I'll forward the message to him and see if he would want to overbook the patient or not.

## 2010-06-19 NOTE — Telephone Encounter (Signed)
Pt has been referred to rheumatology by her Dr. Paulla Fore. She already has a CT scan on file and would like to have the 2 appt be on the same day if at all possible, as she lives in [place] and has to drive 7 hours to get to [place]. Currently, the scheduled CT is for Monday, 06/24/2010 @ 9:00 AM, and will be taken at [facility] w/out contrast.  Can we book pt same day to avoid driving 2 trips from [place] to [place]?  Please call back to advise.    Reason for referral was ILD (interstitial lung disease) [516.34] (Primary) and Sjogren's disease [710.2].

## 2010-06-20 NOTE — Telephone Encounter (Signed)
Sorry; I would not be able to give the patient the time her complex case requires if we overbook her; lets see her (either me or whomeever has availability) as soon as we can regularly schedule

## 2010-06-20 NOTE — Telephone Encounter (Signed)
Scheduled pt for 6/13 with Dr.Bluestein (she had actually seen him about 10 years ago). Patient was very happy with the outcome.

## 2010-06-26 ENCOUNTER — Other Ambulatory Visit (INDEPENDENT_AMBULATORY_CARE_PROVIDER_SITE_OTHER): Payer: Self-pay | Admitting: Rheumatology

## 2010-06-26 ENCOUNTER — Ambulatory Visit (INDEPENDENT_AMBULATORY_CARE_PROVIDER_SITE_OTHER): Admitting: Rheumatology

## 2010-06-26 ENCOUNTER — Encounter (INDEPENDENT_AMBULATORY_CARE_PROVIDER_SITE_OTHER): Payer: Self-pay | Admitting: Rheumatology

## 2010-06-26 LAB — URINALYSIS
Bilirubin: NEGATIVE
Blood: NEGATIVE
Glucose: NEGATIVE
Ketones: NEGATIVE
Leuk Esterase: NEGATIVE
Nitrite: NEGATIVE
Protein: NEGATIVE
Specific Gravity: 1.006 (ref 1.002–1.030)
pH: 5 (ref 5.0–8.0)

## 2010-06-26 LAB — C3, BLOOD: C3: 133 mg/dL (ref 90–180)

## 2010-06-26 LAB — CPK-CREATINE PHOSPHOKINASE, BLOOD: CPK: 29 U/L (ref 0–175)

## 2010-06-26 LAB — C-REACTIVE PROTEIN, BLOOD: CRP: 0.5 mg/dL — ABNORMAL HIGH (ref <0.5)

## 2010-06-26 LAB — C4, BLOOD: C4: 19 mg/dL (ref 10–40)

## 2010-06-26 LAB — SED RATE, BLOOD: Sed Rate: 37 mm/hr — ABNORMAL HIGH (ref 0–30)

## 2010-06-26 NOTE — Progress Notes (Signed)
This office note has been dictated.  [id number]

## 2010-06-26 NOTE — Patient Instructions (Signed)
See avs

## 2010-06-27 LAB — ANA (ANTI-NUCLEAR AB), BLOOD: ANA (Anti-Nuclear Ab): POSITIVE

## 2010-06-27 NOTE — Progress Notes (Signed)
CLINIC: [facility]    REPORT TYPE: CONSULT    Dictating Practitioner: Eugenia Mcalpine. Paris Hohn, M.D.    DATE OF SERVICE:  06/26/2010    REASON FOR VISIT: NEW PATIENT; DEVELOPED SIGNS OF INTERSTITIAL PULMONARY  DISEASE        REFERRING PHYSICIAN: Bluford Main, MD    HISTORY OF PRESENT ILLNESS: The patient is a 60 year old woman with  long-standing pulmonary artery hypertension, on Flolan pump, referred by  Dr. Paulla Fore because she developed some signs of interstitial pulmonary  disease. She has high titer positive ANA and a diagnosis of Sjogren's and  he wanted to know if she had a rheumatologic condition that could  predispose her to interstitial pneumonitis or pulmonary fibrosis.    She reports that arthritis has not been a problem. Although she has had  achiness in joints, she has never had inflammatory swelling. She has  constant erythema, some regions confluent and some regions macular, as a  side effect from the Flolan. She has had no other skin rash. She had a  single episode of periorbital cellulitis that was presumed to be on an  inflammatory but not infectious basis and went away rapidly. No other eye  inflammation. She has the dry eyes and dry mouth of Sjogren's. She does not  have Raynaud phenomenon or esophageal dysmotility symptoms. She has no  pleurisy and no symptoms of inflammatory bowel disease.    PAST MEDICAL HISTORY: Reveals that she has a high uric acid, presumably as  a complication of Flolan, and she has had acute inflammatory joint  involvement compatible with gout. She was treated for a few months with  allopurinol, but developed hives and it was discontinued.    CURRENT MEDICATIONS  Include:  1. Flolan and Revatio for pulmonary hypertension.  2. Lasix.  3. Warfarin.  4. Digoxin.  5. In addition, estrogen and progesterone.    FAMILY HISTORY: Reveals that she has a mother with rheumatoid arthritis and  a cousin with Sjogren's.    PHYSICAL EXAMINATION  GENERAL: She is normally developed,  with bright red skin on her arms and  legs. HEENT: Examination of the hair and scalp is unremarkable. Membranes  of the eyes and mouth are mildly inflamed and dry, with minimal pooling of  tears in the eyes or saliva in the mouth.  NECK: Supple, with full range of motion. No C-spine tenderness.  CHEST: There are some basilar crackles, particularly at the left base.  Lungs are otherwise clear.  HEART: There is a grade 2/6, short systolic murmur, loudest in the pulmonic  region.  ABDOMEN: Obese and soft. Liver and spleen not enlarged. No masses or  tenderness.  EXTREMITIES: Normal muscle strength. There is clubbing at the nail beds. No  nail pitting.  JOINTS: There is some mild osteoarthritic bony enlargement at the PIP and  DIP joints. The rest of the complete joint exam is normal.    LABORATORY: Laboratory studies that she brought with her confirm high titer  positive ANA with negative rheumatoid factor.    CT scan of the lung done last week shows signs of interstitial fibrosis and  pneumonitis, but unchanged from a CT scan done a year earlier.    ASSESSMENT AND PLAN: She has signs of Sjogren syndrome, but no other  symptoms or physical findings to point to any of the rheumatologic  conditions that are more likely to cause interstitial pneumonitis. We will  obtain screening labs, looking for signs for lupus or dermatomyositis or  scleroderma and then will discuss with Dr. Paulla Fore whether she needs therapy  for the inflammatory lung condition given that the CT scan has not changed  in the past year. Followup will be arranged at that time.                        Electronically signed by:  Eugenia Mcalpine. Laverle Patter, M.D. 06/27/2010 12:47 P          DD: 06/26/2010    DT: 06/27/2010 07:00 A   DocNo.: [id number]  [id number]                 [id number]    Referring Physician:  UNKNOWN PHYSICIAN        Primary Care Physician:  [facility] DAVID MD  [address]  [place], [place] [address]    cc:    Kermit Balo. Paulla Fore, M.D.         Fax Recipient  [phone]         [facility]         [place] [address]

## 2010-07-02 LAB — ANTI-NUCLEAR-AB-TITER, BLOOD: Anti-Nuclear Ab Titer: >1:640 {titer}

## 2010-07-03 LAB — SSB, BLOOD: SSB Ab: NEGATIVE

## 2010-07-03 LAB — SSA, BLOOD: SSA Ab: NEGATIVE

## 2010-07-04 LAB — ANTI-DSDNA, BLOOD: Anti-DSDNA: >300 IU — ABNORMAL HIGH (ref 0–24)

## 2010-12-18 ENCOUNTER — Encounter (INDEPENDENT_AMBULATORY_CARE_PROVIDER_SITE_OTHER): Payer: Self-pay | Admitting: Rheumatology

## 2010-12-18 ENCOUNTER — Ambulatory Visit (INDEPENDENT_AMBULATORY_CARE_PROVIDER_SITE_OTHER): Admitting: Rheumatology

## 2010-12-18 ENCOUNTER — Encounter (HOSPITAL_COMMUNITY): Payer: Self-pay | Admitting: Pulmonary Medicine

## 2010-12-18 ENCOUNTER — Ambulatory Visit (HOSPITAL_COMMUNITY): Admitting: Pulmonary Medicine

## 2010-12-18 ENCOUNTER — Other Ambulatory Visit (HOSPITAL_COMMUNITY): Payer: Self-pay | Admitting: Pulmonary Medicine

## 2010-12-18 VITALS — BP 124/55 | HR 61 | Temp 98.2°F | Resp 14 | Ht 62.0 in | Wt 164.0 lb

## 2010-12-18 VITALS — BP 124/55 | HR 65 | Temp 98.2°F

## 2010-12-18 MED ORDER — POTASSIUM CHLORIDE CRYS CR 20 MEQ OR TBCR
20.0000 meq | EXTENDED_RELEASE_TABLET | Freq: Three times a day (TID) | ORAL | Status: DC
Start: 2010-12-18 — End: 2016-05-16

## 2010-12-18 MED ORDER — DIGOXIN 0.25 MG OR TABS
250.0000 ug | ORAL_TABLET | Freq: Every day | ORAL | Status: DC
Start: 2010-12-18 — End: 2011-06-24

## 2010-12-18 MED ORDER — AMOXICILLIN-POT CLAVULANATE 875-125 MG OR TABS
1.00 | ORAL_TABLET | Freq: Two times a day (BID) | ORAL | Status: DC
Start: ? — End: 2011-06-08

## 2010-12-18 MED ORDER — WARFARIN SODIUM 5 MG OR TABS
5.0000 mg | ORAL_TABLET | Freq: Every day | ORAL | Status: DC
Start: 2010-12-18 — End: 2016-05-16

## 2010-12-18 MED ORDER — SILDENAFIL CITRATE 20 MG OR TABS
80.0000 mg | ORAL_TABLET | Freq: Three times a day (TID) | ORAL | Status: DC
Start: 2010-12-18 — End: 2016-05-16

## 2010-12-18 MED ORDER — FUROSEMIDE 80 MG OR TABS
120.0000 mg | ORAL_TABLET | Freq: Every day | ORAL | Status: DC
Start: 2010-12-18 — End: 2011-06-24

## 2010-12-18 NOTE — Progress Notes (Signed)
This office note has been dictated.  [id number]

## 2010-12-18 NOTE — Patient Instructions (Signed)
See avs

## 2010-12-19 NOTE — Progress Notes (Signed)
This office note has been dictated.

## 2010-12-19 NOTE — Progress Notes (Signed)
CLINIC: [facility]    REPORT TYPE: NOTE    Dictating Practitioner: Eugenia Mcalpine. Dynasti Kerman, M.D.    DATE OF SERVICE:  12/18/2010    REASON FOR VISIT: FOLLOWUP; SLE WITH INTERSTITIAL PNEUMONITIS        SUBJECTIVE: This is the first return visit in 6 months. She reports that  until the Thanksgiving holiday, she had done very well. Everything was  stable. No flare-ups of symptoms. While with family in [place] for the  holidays, she developed high fevers and was found to have a pneumonia and  grew streptococcus from blood cultures. She was treated with IV antibiotics  and responded and was discharged with Augmentin, which she is about to  complete. There has been no return of fevers or shortness of breath.    She followed up with Dr. Paulla Fore in Pulmonary earlier today. He reported her  lungs were clear and had ordered some followup blood cultures and lab work  to be sure everything had resolved, but he was comfortable that her  pulmonary function was stable.    She reports that since the last visit, she has had no flare-up of joint  inflammation, no skin involvement, and her Sjogren symptoms have been  stable, with occasional irritation to her eyes that resolves with more  frequent use of artificial tears and frequently drinking water to keep her  dry mouth comfortable.    OBJECTIVE: She looks well. She has her stable Flolan rash with nothing that  looks like lupus dermatitis. Complete joint exam is normal.    Laboratory studies done at the last visit were consistent with Sjogren  disease, but, in addition, she had high titer, anti-double-stranded DNA  antibody with normal complements, suggesting that the underlying connective  tissue disease may be lupus, but she has no active lupus manifestations.    PLAN: We will make no change at present, but given the lupus findings I  will want to see her back in followup more frequently and recommended 3  months. She is due to see Dr. Paulla Fore in 4 months, so will arrange  to see [place]  both at the same time.                        Electronically signed by:  Eugenia Mcalpine. Laverle Patter, M.D. 12/19/2010 12:58 P          DD: 12/18/2010    DT: 12/19/2010 06:25 A   DocNo.: [id number]  [id number]                 [id number]    Referring Physician:  [facility] DAVID MD  [address]  [place] [address]    Primary Care Physician:  NO PCP PER PATIENT        cc:

## 2010-12-19 NOTE — Progress Notes (Signed)
CLINIC: [facility]    REPORT TYPE: NOTE    Dictating Practitioner: Kermit Balo. Paulla Fore, M.D.    DATE OF SERVICE:  12/18/2010    REASON FOR VISIT: FOLLOWUP, PULMONARY ARTERIAL HYPERTENSION        HISTORY OF PRESENT ILLNESS: 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 She is a long time Flolan/Veletri patient and was clinically  doing quite well until she reports developing pneumonia while she was  traveling which required hospitalization in [place]. At that time she was  administered antibiotics and had improvement in her symptoms. She is now  currently completing a course of Augmentin and is finally feeling close to  her baseline. She reports that at the initial hospital she presented to,  there was 1 positive blood culture; however, subsequent blood cultures were  nondiagnostic and did not grow bacteria. She reports no episodes of fevers,  no chills. She says that her symptoms of shortness of breath have improved  and she is back to baseline. She now has no cough, no sputum production, no  chest pains, no palpitations, no syncope, no presyncope, and her edema is  well controlled.    ALLERGIES: ALLOPURINOL, LEVAQUIN, SULFA DRUGS.    CURRENT MEDICATIONS  1. Augmentin 1 Tab by mouth 2 times daily, Last Dose tomorrow.  2. Coumadin 5 mg p.o. alternating with 2.5 mg.  3. Calcium with vitamin D.  4. Lasix 80 mg 1-1/2 tablets p.o. daily.  5. Potassium chloride 20 mEq p.o. t.i.d.  6. Revatio 80 mg p.o. t.i.d.  7. Veletri continuous infusion 29 ng/kg per minute.  8. Digoxin 0.25 mg p.o. daily.  9. Aldactone 50 mg p.o. daily.    PHYSICAL EXAMINATION  VITAL SIGNS: Blood pressure 124/55, pulse 61, respirations 14, temperature  98.2, weight 164 pounds, height 5 feet 2 inches, saturation 93% on room  air.  GENERAL: Awake, alert, oriented, no acute distress. Able to speak in  full  sentences. HEENT: Oropharynx clear.  NECK: Minimal elevation of jugular venous distention. No thyromegaly. No  lymphadenopathy.  HEART: Regular rate and rhythm. Loud second heart sound.  LUNGS: Clear to auscultation bilaterally. No rales, no wheezes.  ABDOMEN: Soft, nondistended, nontender, positive bowel sounds, no  organomegaly noted. EXTREMITIES: No clubbing, cyanosis, or edema.  SKIN: No rashes.  JOINTS: No swollen joints.  CHEST:  Chest wall line site clean, dry. No evidence of drainage or  infection.    ASSESSMENT: Ms. Jesus Cioni is a 60 year old female with a history  of WHO group I pulmonary arterial hypertension associated with connective  tissue disease, who presents to clinic after her suffering with what sounds  like a bronchitis versus upper respiratory tract infection/pneumonia and is  now improved. Her oxygen saturations have returned to her baseline and her  functional status is returning to baseline.    PLAN  1. Based on today's findings, we will not make any changes to her pulmonary  vasodilator regimen. We will continue her on Veletri and Revatio.  Additionally, we will continue her current dose of diuretics with Lasix 120  mg p.o. daily, potassium supplementation and Aldactone.  2. We will obtain labs today, including a CBC, blood cultures, and  comprehensive metabolic panel to ensure that she does not have an indolent  infection not identified on previous blood cultures.  3. We will plan to see  her back in approximately 4 months. At that time, we  will obtain a repeat 6-minute walk test and echocardiogram.                        Electronically signed by:  Kermit Balo. Paulla Fore, M.D. 12/25/2010 02:55 P          DD: 12/19/2010    DT: 12/19/2010 09:58 A   DocNo.: [id number]  [id number]                 [id number]    Referring Physician:  [facility] Marypat Kimmet MD  [address]  [place] [address]    Primary Care Physician:  NO PCP PER PATIENT        cc:

## 2011-01-27 ENCOUNTER — Encounter (HOSPITAL_COMMUNITY): Payer: Self-pay | Admitting: Pulmonary Medicine

## 2011-04-03 ENCOUNTER — Encounter (HOSPITAL_COMMUNITY): Payer: Self-pay | Admitting: Pulmonary Medicine

## 2011-04-16 ENCOUNTER — Encounter (HOSPITAL_COMMUNITY): Payer: Self-pay | Admitting: Pulmonary Medicine

## 2011-04-16 ENCOUNTER — Encounter (INDEPENDENT_AMBULATORY_CARE_PROVIDER_SITE_OTHER): Payer: Self-pay | Admitting: Rheumatology

## 2011-04-16 ENCOUNTER — Ambulatory Visit (INDEPENDENT_AMBULATORY_CARE_PROVIDER_SITE_OTHER): Admitting: Rheumatology

## 2011-04-16 ENCOUNTER — Inpatient Hospital Stay (HOSPITAL_COMMUNITY): Admit: 2011-04-16 | Discharge: 2011-04-16

## 2011-04-16 ENCOUNTER — Other Ambulatory Visit (INDEPENDENT_AMBULATORY_CARE_PROVIDER_SITE_OTHER): Payer: Self-pay | Admitting: Rheumatology

## 2011-04-16 ENCOUNTER — Ambulatory Visit (HOSPITAL_COMMUNITY): Admitting: Pulmonary Medicine

## 2011-04-16 ENCOUNTER — Other Ambulatory Visit (HOSPITAL_COMMUNITY): Payer: Self-pay | Admitting: Pulmonary Medicine

## 2011-04-16 VITALS — BP 127/74 | HR 93 | Temp 98.8°F

## 2011-04-16 VITALS — BP 125/67 | HR 96 | Temp 98.5°F | Resp 18 | Ht 62.0 in | Wt 161.6 lb

## 2011-04-16 LAB — HEMOGRAM, BLOOD
Hct: 39.5 % (ref 34.0–45.0)
Hgb: 12.3 gm/dL (ref 11.2–15.7)
MCH: 22.9 pg — ABNORMAL LOW (ref 26.0–32.0)
MCHC: 31.1 % — ABNORMAL LOW (ref 32.0–36.0)
MCV: 73.7 um3 — ABNORMAL LOW (ref 79.0–95.0)
MPV: 9.6 fL (ref 9.4–12.4)
Plt Count: 134 10*3/uL — ABNORMAL LOW (ref 140–370)
RBC: 5.36 10*6/uL — ABNORMAL HIGH (ref 3.90–5.20)
RDW: 17.7 % — ABNORMAL HIGH (ref 12.0–14.0)
WBC: 6.6 10*3/uL (ref 4.0–10.0)

## 2011-04-16 LAB — COMPREHENSIVE METABOLIC PANEL, BLOOD
ALT (SGPT): 11 U/L (ref 0–33)
AST (SGOT): 17 U/L (ref 0–32)
Albumin: 4 g/dL (ref 3.5–5.2)
Alkaline Phos: 85 U/L (ref 35–140)
BUN: 30 mg/dL — ABNORMAL HIGH (ref 6–20)
Bicarbonate: 23 mmol/L (ref 22–29)
Bilirubin, Tot: 0.3 mg/dL (ref <1.2)
Calcium: 10.6 mg/dL — ABNORMAL HIGH (ref 8.6–10.5)
Chloride: 105 mmol/L (ref 98–107)
Creatinine: 1.09 mg/dL — ABNORMAL HIGH (ref 0.51–0.95)
GFR: 51 mL/min
Glucose: 93 mg/dL (ref 70–115)
Potassium: 4.2 mmol/L (ref 3.5–5.1)
Sodium: 138 mmol/L (ref 136–145)
Total Protein: 8.8 g/dL — ABNORMAL HIGH (ref 6.0–8.0)

## 2011-04-16 LAB — URINALYSIS
Bilirubin: NEGATIVE
Blood: NEGATIVE
Glucose: NEGATIVE
Ketones: NEGATIVE
Leuk Esterase: NEGATIVE
Nitrite: NEGATIVE
Protein: NEGATIVE
Specific Gravity: 1.008 (ref 1.002–1.030)
Squam. Epithelial Cell: <1 (ref 0–?)
pH: 6 (ref 5.0–8.0)

## 2011-04-16 LAB — C4, BLOOD: C4: 19 mg/dL (ref 10–40)

## 2011-04-16 LAB — SED RATE, BLOOD: Sed Rate: 60 mm/hr — ABNORMAL HIGH (ref 0–30)

## 2011-04-16 LAB — C3, BLOOD: C3: 133 mg/dL (ref 90–180)

## 2011-04-16 LAB — C-REACTIVE PROTEIN, BLOOD: CRP: 1.5 mg/dL — ABNORMAL HIGH (ref <0.5)

## 2011-04-16 NOTE — Procedures (Addendum)
 FINAL  [Page 1]                       Pulmonary Function Laboratory   PATIENT Information             [name], Director      Last Name:Alcoser           [address]            [name]           [place], [place] [address]             UJ#:[id number]  GN#[id number]            Ph: [phone]              PATIENT LOCATION:[facility]                                           Technician:A.Theodoro Grist M.D.: [place]och                                                                                      EXERCISE STUDY                                           DATE OF STUDY:04/16/2011 12:15 PM                                              Test Status - Preliminary or   Final?:F           Age:61 Ht:          cm Wt:          kg   Sex:F Purpose:6 Min Walk                           Page 1 83f 1 [id number]                      Position           Sitting Stand  ---     ---    ---     ---           % FIO2             RA      RA     RA      ---    ---     ---           Speed (MPH)  Rest    Rest   Walk    ---    ---     ---           Elevation (%)                                                                             Duration (min)     10:00   5:00   6:00                                            ARTERIAL BLOOD                                                      GASES           PaO2 (mmHg)                                                                               PaCO2 (mmHg)                                                                              PH                                                                                        %O2Hb(Co oximeter)                                                                        SpO2               92      92     85  CARDIOVASCULAR                                                      Heart Rate (bpm)   93      97     133                                              BP systolic (mmHg)                                                                        BP diastolic (mmHg)                                                                        PERCEIVED SYMPTOMS (0 - 10)                                         Fatigue                      0.5  3                                               Breathlessness               0.5  3                                                                     Hgb (gm/dl)            COHb (gm%)                                  Technician Comments:Patient completed protocol and            walked 477 meters.                                  Interpretation:                      The patient walked 477 meters in 6 minutes.  This is a  73 meter  decrease, which represents a 13% change           from the previous test dated 06/17/10.                       [name], MD [facility]

## 2011-04-16 NOTE — Patient Instructions (Signed)
See avs

## 2011-04-16 NOTE — Procedures (Signed)
 No note

## 2011-04-16 NOTE — Progress Notes (Signed)
This office note has been dictated.  [id number]

## 2011-04-17 LAB — ANTI-DSDNA, BLOOD: Anti-DSDNA: >200 IU — ABNORMAL HIGH (ref 0–24)

## 2011-04-17 NOTE — Progress Notes (Signed)
CLINIC: [facility]    REPORT TYPE: NOTE    Dictating Practitioner: Diana Chambers, M.D.    DATE OF SERVICE:  04/16/2011    REASON FOR VISIT: FOLLOWUP/PULMONARY ARTERIAL HYPERTENSION        HISTORY OF PRESENT ILLNESS: [name] is a 61 year old female  with a past medical history significant for WHO group I pulmonary arterial  hypertension associated with connective tissue disease, who returns to  clinic for routine followup. She reports that her symptoms have been stable  since she was last seen and she has continued on her same dose of Flolan  intravenous as well as sildenafil 80 mg p.o. t.i.d.    Recently, she reports that she has switched from name brand Revatio to  generic sildenafil and during the month she was on the generic sildenafil  noticed worsening symptoms and increasing symptoms of shortness of breath.  She since been switched back to name brand Revatio and felt that her  symptoms improved. She is now currently in a trial of a second version of  the generic sildenafil and is evaluating its effect on her symptoms. She  reports no episodes of chest pains, no palpitations, no syncope, no  presyncope, no worsening lower extremity edema or abdominal distention. Her  dose of diuretics is stable.    ALLERGIES: ALLOPURINOL, LEVAQUIN, CIPROFLOXACIN AND SULFA DRUGS.    CURRENT MEDICATIONS  1. Aldactone 50.  2. Calcium with vitamin D.  3. Digoxin 0.25 mg.  4. Lasix 120 mg p.o. daily.  5. Provera 1.25 mg p.o. daily.  6. Potassium chloride 20 mEq p.o. 3 times a day,  7. Revatio 80 mg p.o. t.i.d.  8. Coumadin as directed by [facility].  9. Flolan continuous infusion 20 ng/kg per minute.    DIAGNOSTIC DATA: Echocardiogram dated 04/16/2011: Normal left ventricular  size and systolic function. Normal diastolic function. Abnormal septal  motion suggesting presence right ventricular volume overload. Right  ventricle is moderately enlarged with overall normal function, measured  TAPSE is 14 mm with  global RV systolic function, qualitatively, appears  preserved. Estimated peak PA and RV systolic pressure 92+ CVP, compared to  previous study 06/17/2010, no significant change.    A 6-minute walk test dated 04/16/2011: Saturation at rest 92%, saturation  with exercise 85%. Heart rate at rest 93, heart rate with exercise 133.  Total distance walked 477 meters.    PHYSICAL EXAMINATION  VITAL SIGNS: Blood pressure 125/67, pulse 96, respirations 18, temperature  98.5, weight 161 pounds, height 5 feet 2 inches, saturation 92% on room  air.  GENERAL: Awake, alert, oriented, no acute distress. Able to speak in full  sentences. HEENT: Oropharynx clear.  NECK: No thyromegaly. No lymphadenopathy. Mild elevation of jugular venous  distention noted.  HEART: Regular rate and rhythm, loud second heart sound, positive systolic  murmur. LUNGS: Clear to auscultation bilaterally. No rales, no wheezes.  ABDOMEN: Soft, nondistended, nontender, positive bowel sounds, no  organomegaly noted.  EXTREMITIES: Trace edema, bilateral lower extremities.  SKIN: No rashes.  JOINTS: No swollen joints.    ASSESSMENT: [name] is a 61 year old female with a history of  WHO group I pulmonary arterial hypertension associated with the presence of  connective tissue disease, who is clinically stable since she was last  seen. She does have a decline in her 6-minute walk test; however, she is  still significantly above a worrisome 6-minute walk distance. Additionally,  her echo is quantitatively and qualitatively unchanged.  PLAN  1. We will continue her current dose of Veletri as well as her current dose  of sildenafil. She requires documentation that name brand Revatio is  superior for her. We will attempt to provide this pending approval from her  insurance company.  2. We will plan to see her back in approximately 4-6 months with a repeat  6-minute walk test.                        Electronically signed by:  Diana Chambers, M.D.  04/27/2011 12:40 P          DD: 04/17/2011    DT: 04/17/2011 10:51 A   DocNo.: [id number]  [id number]                 [id number]    Referring Physician:  [facility] Diana Meditz MD  [address]  [place] [address]    Primary Care Physician:  NO PCP PER PATIENT        cc:

## 2011-04-17 NOTE — Progress Notes (Signed)
This office note has been dictated.

## 2011-04-17 NOTE — Progress Notes (Signed)
CLINIC: [facility]    REPORT TYPE: NOTE    Dictating Practitioner: Eugenia Mcalpine. Laverle Patter, M.D.    DATE OF SERVICE:  04/16/2011    REASON FOR VISIT: Diana Chambers; SLE        SUBJECTIVE: Diana Chambers returns for routine 3- to 36-month followup. She reports  she has had no flare-up of symptoms that were lupus-like in that time. She  has had no joint inflammation, no new skin rash. She gets occasional  fleeting chest pains that do not persist for more than a few minutes at a  time. She has just had her routine followup with Dr. Paulla Fore in Pulmonary for  her pulmonary vascular hypertension and he reports that things are stable  and no change in therapy is needed.    OBJECTIVE: Examination of the skin shows only the Flolan-induced skin  erythema, with no frank dermatitis. Complete joint exam shows no sign of  active inflammation anywhere.    ASSESSMENT AND PLAN: She has had no followup labs since the last visit. We  will obtain screening labs for lupus activity, but will not initiate any  therapy at this time. If the labs are unchanged, we will not see her back  in followup for 6 months, but we will repeat labs in her hometown in 3  months' time.                        Electronically signed by:  Eugenia Mcalpine. Laverle Patter, M.D. 04/17/2011 01:09 P          DD: 04/16/2011    DT: 04/17/2011 01:57 A   DocNo.: [id number]  [id number]                 [id number]    Referring Physician:  [facility] DAVID MD  [address]  [place] [address]    Primary Care Physician:  NO PCP PER PATIENT        cc:

## 2011-06-06 ENCOUNTER — Inpatient Hospital Stay
Admission: AD | Admit: 2011-06-06 | Discharge: 2011-06-08 | Disposition: A | Discharge status: Home Health | Payer: Self-pay | Attending: Pulmonary Medicine | Admitting: Pulmonary Medicine

## 2011-06-06 ENCOUNTER — Encounter (HOSPITAL_BASED_OUTPATIENT_CLINIC_OR_DEPARTMENT_OTHER): Payer: Self-pay

## 2011-06-06 DIAGNOSIS — R079 Chest pain, unspecified: Secondary | ICD-10-CM

## 2011-06-06 DIAGNOSIS — I270 Primary pulmonary hypertension: Secondary | ICD-10-CM

## 2011-06-06 DIAGNOSIS — R600 Localized edema: Secondary | ICD-10-CM

## 2011-06-06 DIAGNOSIS — M3500 Sicca syndrome, unspecified: Secondary | ICD-10-CM

## 2011-06-06 HISTORY — DX: Pulmonary hypertension, unspecified (CMS-HCC): I27.20

## 2011-06-06 MED ORDER — SODIUM CHLORIDE 0.9 % IV SOLN
INTRAVENOUS | Status: DC | PRN
Start: 2011-06-06 — End: 2011-06-08

## 2011-06-06 MED ORDER — PROSTACYCLIN ICE PACK (~~LOC~~)
Freq: Four times a day (QID) | Status: DC
Start: 2011-06-07 — End: 2011-06-08
  Administered 2011-06-08 (×3): 2
  Filled 2011-06-06: qty 1

## 2011-06-06 MED ORDER — GLYCINE DILUENT IV SOLN
29.0000 ng/kg/min | INTRAVENOUS | Status: DC
Start: 2011-06-06 — End: 2011-06-08
  Administered 2011-06-06 – 2011-06-08 (×3): 29 ng/kg/min via INTRAVENOUS
  Filled 2011-06-06 (×4): qty 4.5

## 2011-06-06 MED ORDER — VITAMIN D 400 UNIT OR TABS
1000.0000 [IU] | ORAL_TABLET | Freq: Every day | ORAL | Status: DC
Start: 2011-06-07 — End: 2011-06-08
  Administered 2011-06-07 – 2011-06-08 (×2): 1000 [IU] via ORAL
  Filled 2011-06-06 (×2): qty 3

## 2011-06-06 MED ORDER — SODIUM CHLORIDE 0.9 % IJ SOLN (CUSTOM)
3.0000 mL | Freq: Three times a day (TID) | INTRAMUSCULAR | Status: DC
Start: 2011-06-06 — End: 2011-06-08
  Administered 2011-06-06 – 2011-06-08 (×5): 3 mL via INTRAVENOUS

## 2011-06-06 MED ORDER — SODIUM CHLORIDE 0.9 % IJ SOLN (CUSTOM)
3.0000 mL | INTRAMUSCULAR | Status: DC | PRN
Start: 2011-06-06 — End: 2011-06-08

## 2011-06-06 MED ORDER — FOLIC ACID 400 MCG OR TABS
0.4000 mg | ORAL_TABLET | Freq: Every day | ORAL | Status: DC
Start: 2011-06-07 — End: 2011-06-06

## 2011-06-06 MED ORDER — PROSTACYCLIN TUBING (~~LOC~~)
Status: DC
Start: 2011-06-06 — End: 2011-06-08
  Filled 2011-06-06: qty 1

## 2011-06-06 MED ORDER — POTASSIUM CHLORIDE CRYS CR 10 MEQ OR TBCR
20.0000 meq | EXTENDED_RELEASE_TABLET | Freq: Once | ORAL | Status: DC
Start: 2011-06-06 — End: 2011-06-06
  Administered 2011-06-06: 20 meq via ORAL

## 2011-06-06 MED ORDER — FUROSEMIDE 10 MG/ML IJ SOLN
INTRAMUSCULAR | Status: AC
Start: 2011-06-06 — End: 2011-06-06
  Administered 2011-06-06: 40 mg via INTRAVENOUS
  Filled 2011-06-06: qty 4

## 2011-06-06 MED ORDER — DIGOXIN 0.125 MG OR TABS
250.0000 ug | ORAL_TABLET | Freq: Every day | ORAL | Status: DC
Start: 2011-06-07 — End: 2011-06-06

## 2011-06-06 MED ORDER — PROSTACYCLIN BATTERIES - PUMP (~~LOC~~)
Status: DC
Start: 2011-06-09 — End: 2011-06-08
  Filled 2011-06-06: qty 1

## 2011-06-06 MED ORDER — POTASSIUM CHLORIDE CRYS CR 10 MEQ OR TBCR
20.0000 meq | EXTENDED_RELEASE_TABLET | Freq: Two times a day (BID) | ORAL | Status: DC
Start: 2011-06-06 — End: 2011-06-08
  Administered 2011-06-06 – 2011-06-08 (×4): 20 meq via ORAL
  Filled 2011-06-06 (×4): qty 2

## 2011-06-06 MED ORDER — ACETAMINOPHEN 325 MG PO TABS
650.0000 mg | ORAL_TABLET | ORAL | Status: DC | PRN
Start: 2011-06-06 — End: 2011-06-08

## 2011-06-06 MED ORDER — PROSTACYCLIN CASSETTE CHANGE (~~LOC~~)
Freq: Every day | Status: DC
Start: 2011-06-06 — End: 2011-06-08
  Filled 2011-06-06: qty 1

## 2011-06-06 MED ORDER — SILDENAFIL CITRATE 20 MG OR TABS
80.0000 mg | ORAL_TABLET | Freq: Three times a day (TID) | ORAL | Status: DC
Start: 2011-06-07 — End: 2011-06-06

## 2011-06-06 MED ORDER — FUROSEMIDE 10 MG/ML IJ SOLN
40.0000 mg | Freq: Once | INTRAMUSCULAR | Status: DC
Start: 2011-06-06 — End: 2011-06-06

## 2011-06-06 MED ORDER — ASPIRIN EC 325 MG OR TBEC
325.00 mg | DELAYED_RELEASE_TABLET | Freq: Once | ORAL | Status: AC
Start: 2011-06-06 — End: 2011-06-06
  Administered 2011-06-06: 325 mg via ORAL
  Filled 2011-06-06: qty 1

## 2011-06-06 MED ORDER — MORPHINE SULFATE 2 MG/ML IJ SOLN
2.0000 mg | INTRAMUSCULAR | Status: DC | PRN
Start: 2011-06-06 — End: 2011-06-06

## 2011-06-06 MED ORDER — DIGOXIN 0.125 MG OR TABS
125.0000 ug | ORAL_TABLET | Freq: Every evening | ORAL | Status: DC
Start: 2011-06-07 — End: 2011-06-06

## 2011-06-06 MED ORDER — WARFARIN SODIUM 2.5 MG OR TABS
2.5000 mg | ORAL_TABLET | ORAL | Status: DC
Start: 2011-06-06 — End: 2011-06-08
  Administered 2011-06-06: 2.5 mg via ORAL
  Filled 2011-06-06 (×2): qty 1

## 2011-06-06 MED ORDER — SILDENAFIL CITRATE 20 MG OR TABS
80.0000 mg | ORAL_TABLET | Freq: Three times a day (TID) | ORAL | Status: DC
Start: 2011-06-06 — End: 2011-06-08
  Administered 2011-06-06 – 2011-06-08 (×6): 80 mg via ORAL

## 2011-06-06 MED ORDER — ONDANSETRON HCL 4 MG/2ML IV SOLN
4.0000 mg | Freq: Four times a day (QID) | INTRAMUSCULAR | Status: DC | PRN
Start: 2011-06-06 — End: 2011-06-06

## 2011-06-06 MED ORDER — WARFARIN SODIUM 5 MG OR TABS
5.0000 mg | ORAL_TABLET | Freq: Every day | ORAL | Status: DC
Start: 2011-06-07 — End: 2011-06-06

## 2011-06-06 MED ORDER — VITAMIN D 1000 UNIT OR TABS
1000.0000 [IU] | ORAL_TABLET | Freq: Every day | ORAL | Status: DC
Start: 2011-06-07 — End: 2011-06-06

## 2011-06-06 MED ORDER — SPIRONOLACTONE 25 MG OR TABS
50.0000 mg | ORAL_TABLET | Freq: Two times a day (BID) | ORAL | Status: DC
Start: 2011-06-06 — End: 2011-06-08
  Administered 2011-06-06 – 2011-06-08 (×4): 50 mg via ORAL
  Filled 2011-06-06 (×4): qty 2

## 2011-06-06 MED ORDER — WARFARIN SODIUM 5 MG OR TABS
5.0000 mg | ORAL_TABLET | ORAL | Status: DC
Start: 2011-06-07 — End: 2011-06-08
  Administered 2011-06-07: 5 mg via ORAL
  Filled 2011-06-06: qty 1

## 2011-06-06 MED ORDER — FOLIC ACID 1 MG OR TABS
0.5000 mg | ORAL_TABLET | Freq: Every day | ORAL | Status: DC
Start: 2011-06-07 — End: 2011-06-08
  Administered 2011-06-07 – 2011-06-08 (×2): 0.5 mg via ORAL
  Filled 2011-06-06 (×2): qty 1

## 2011-06-06 MED ORDER — DIGOXIN 0.125 MG OR TABS
125.0000 ug | ORAL_TABLET | Freq: Every evening | ORAL | Status: DC
Start: 2011-06-06 — End: 2011-06-08
  Administered 2011-06-06 – 2011-06-07 (×2): 125 ug via ORAL
  Filled 2011-06-06 (×2): qty 1

## 2011-06-06 NOTE — ED Notes (Signed)
Received pt in room # 2  Pt AXO x 4 and has SOB    61 year-old female with history of pulmonary HTN  Pt to ED with c/c of increased SOB x2 days.  Pt recently took a road trip cross country to [place] and back.  Pt and pt's husband decided to drive back to [facility] instead of staying in [place] when pt's symptoms worsened.  She reports that they took numerous pit stops to rest and stretch, some swelling in ankles, but relieved when propped up on pillows.    She reports that SOB is unprovoked and occurs even at rest.  She has mid-sternal CP which radiates to upper epigastric/diaprhagm area.    Yesterday she coughed up "phlegm that looked like white paint" with increased pain in chest.    Baseline SpO2 92% at home, does not use home O2.    Placed on cardiac monitor with audible alarms.  NSR with HR in 70's, BP 120's systolic, 70's diastolic, SpO2 90-93% on RA.    Pt adjusted into position of comfort.    Call light within reach, side rails up.  Husband at bedside.

## 2011-06-06 NOTE — ED Notes (Signed)
Dr. Sapiro at bedside to evaluate pt

## 2011-06-06 NOTE — ED Notes (Signed)
EKG complete and given to Dr. Ardine Eng for review. EKG copy placed in patient's chart. Previous EKG printed from [facility] and given to Dr. Ardine Eng for review.

## 2011-06-06 NOTE — ED Notes (Signed)
PCXR done at bedside

## 2011-06-06 NOTE — ED Provider Notes (Signed)
Diana Chambers  December 13, 1950  [id number]  No Pcp, Per Patient    Chief Complaint   Patient presents with   . Shortness of Breath     sob x 2 days, also pain in mid chest and "band" feeling with coughing, pt is a flolan pt x 12 years. pt unable to sleep on right side.        HPI:  61 year old female with c/o increasing sob over past 2 days as well as sternal cp.  Pain worse w/ exertion also when lying on left side last night.  She has long standing pulm htn assocd w/ contnective tissue disease, w/ r heart failure, on flolan.  Says she is doing very well, no longer on transplant list as result.  Never had cp like this, midsternal traveling to [place], no n/v.  Has been doing long car trip with husband, yesterday sats dropped into 70s, normally they are 90-93% on RA, pt does not use supplemental O2.  Diana Chambers f/c.  Was hospitalized w/ pna in the fall, no hopitalizations since.  No radiation of pain to arm.  No leg pain or swelling.  She is followed by Dr. Paulla Fore here at [facility].         Past Medical History   Diagnosis Date   . 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 Pulmonary HTN      No past surgical history on file.  History   Substance Use Topics   . Smoking status: Former Smoker -- 0.50 packs/day for 5 years     Quit date: 01/13/1978   . Smokeless tobacco: Never Used   . Alcohol Use: No     Family History   Problem Relation Age of Onset   .          Patient's Medications   New Prescriptions    No medications on file   Previous Medications    ALDACTONE 50 MG OR TABS    one tab twice daily    AMOXICILLIN-CLAVULANATE (AUGMENTIN) 875-125 MG PER TABLET    Take 1 tablet by mouth 2 times daily.    CHOLECALCIFEROL (VITAMIN D) 1000 UNIT CAPS    Take 2,000 Units by mouth daily.    DIGOXIN (LANOXIN) 0.25 MG TABLET    Take 1 tablet by mouth daily.    ESTRADIOL (DIVIGEL) 0.1 % GEL    As directed    FLOLAN 1.5 MG IV SOLR    29ng/kg/min    FOLIC ACID 400 MCG OR TABS    1 TABLET DAILY    FUROSEMIDE (LASIX) 80 MG TABLET    Take 1.5 tablets  by mouth daily.    MEDROXYPROGESTERONE (PROVERA) 2.5 MG TABLET    Take 1.25 mg by mouth daily.    POTASSIUM CHLORIDE (KLOR-CON M20) 20 MEQ TABLET    Take 1 tablet by mouth 3 times daily.    SILDENAFIL (REVATIO) 20 MG TABLET    Take 4 tablets by mouth 3 times daily. Take 4 tablets three times daily      WARFARIN (COUMADIN) 5 MG TABLET    Take 1 tablet by mouth daily. 5mg  x 5days a week 2.5mg  2 days/wk   Modified Medications    No medications on file   Discontinued Medications    No medications on file     Allergies   Allergen Reactions   . Allopurinol Hives   . Levaquin Swelling and Other     Severe joint pain     .  Ofloxacin Nausea and Vomiting   . Sulfa Drugs Unspecified       ROS:  as per HPI, otherwise all systems reviewed and are negative       PE:  WNWD female, breathless but speaking in full sentences.  BP 135/74  Pulse 109  Temp(Src) 99 F (37.2 C)  Resp 22  Ht 5\' 2"  (1.575 m)  Wt 73.483 kg (162 lb)  BMI 29.62 kg/m2  SpO2 92%  HEENT: ncat, perrl, eomi, sclera anicteric,  moist mucus membranes  NECK: supple, no JVD  COR: rr, normal S1/S2,no rubs/murmurs/gallops aprec'd  CHEST: rales at bases bilat, good air movement  ABDOMEN: soft, nondistended, nontender, no midepig ttp  Normal bowel sounds, no hepatosplenomegaly, no masses.  BACK: no MLT or CVAT  EXT: trace edema, calves SNT  SKIN: warm, dry, no rashes  NEURO: alert, ox3, cn II-XII intact, motor 5/5 all extremities, sensation intact and symmetric.  Gait stable.      MDM, ASSESSMENT AND PLAN:   61 yo f w/ pulm htn and r heart failure presenting w/ exertional chestpain and worsening dyspnea.  Most likely pulmonary etiology given known htn although ACS certainly on ddx.  Recent car trip raises possibilty of DVT/PE.  o cxr, ekg (done), dvt study  o full labs including cardiac markers  o asa, consider ntg, also bedside u/s r/o pericardial effusion  o pulmonary consult (done)      DIAGNOSTIC STUDIES  O2 sat interpretation: normal on RA  Rhythm  interpretation: sinus tach  Labs:  Results for orders placed during the hospital encounter of 06/06/11   BASIC METABOLIC PANEL, BLOOD       Result Value Range    Glucose 136 (*) 70 - 115 mg/dL    BUN 23 (*) 6 - 20 mg/dL    Creatinine 1.61 (*) 0.51 - 0.95 mg/dL    GFR 55      Sodium 096 (*) 136 - 145 mmol/L    Potassium 3.6  3.5 - 5.1 mmol/L    Chloride 102  98 - 107 mmol/L    Bicarbonate 23  22 - 29 mmol/L    Calcium 10.5  8.6 - 10.5 mg/dL   PHOSPHORUS, BLOOD       Result Value Range    Phosphorous 2.7  2.7 - 4.5 mg/dL   MAGNESIUM, BLOOD       Result Value Range    Magnesium 1.9  1.7 - 2.6 mg/dL   CKMB+INDEX (NO CPK) PANEL, BLOOD       Result Value Range    CK-MB 1.7  0.0 - 2.8 ng/mL    CK-MB Index - (*) 0.0 - 2.5 %   CPK-CREATINE PHOSPHOKINASE, BLOOD       Result Value Range    CPK 23  0 - 175 U/L   TROPONIN T, BLOOD       Result Value Range    Troponin T 0.02  <0.01 ng/mL   CBC WITH ADIFF, BLOOD       Result Value Range    WBC 6.4  4.0 - 10.0 1000/mm3    RBC 5.10  3.90 - 5.20 mill/mm3    Hgb 12.4  11.2 - 15.7 gm/dL    Hct 04.5  40.9 - 81.1 %    MCV 72.7 (*) 79.0 - 95.0 um3    MCH 24.3 (*) 26.0 - 32.0 pgm    MCHC 33.4  32.0 - 36.0 %    RDW 17.9 (*) 12.0 - 14.0 %  MPV 9.8  9.4 - 12.4 fL    Plt Count 133 (*) 140 - 370 1000/mm3    Segs 72 (*) 34 - 71 %    Imm Gran % 1  0 - 1 %    Lymphocytes 21  19 - 53 %    Monocytes 6  5 - 12 %    Eosinophils 1  1 - 7 %    Absolute Neutrophil Count 4.6  1.6 - 7.0 K/uL    Abs Lymphs 1.4  0.8 - 3.1 1000/mm3    Abs Monos 0.4  0.2 - 0.8 1000/mm3    Diff Type Automated     PROTHROMBIN TIME, BLOOD       Result Value Range    PT,Patient 30.8 (*) 9.7 - 12.5 sec    INR 2.8     APTT, BLOOD       Result Value Range    PTT 36.4 (*) 25.0 - 34.0 sec   DIGOXIN, BLOOD       Result Value Range    Digoxin 1.3  0.9 - 2.5 ng/mL       X-rays: CXR w/ mild pulm edema, no infiltrate appreciated on prelim.    Creatinine elevated but baseline.  Borderline hyponatremia.  Dig therapeutic.  Trop is  detectable.    EKG: Sinus tach at 105 bpm, R axis, poss LAE, these are unchanged from priors.  ST w/ nonspecific changes, some seen on prior.  Poss U waves.    ED COURSE:   Pt was overall comfortable while in ED.  CP with movement but resolved if lying still.  CXR neg for infiltrate, mild edema.  Bedside u/s neg for pericardial effusion.  DVT u/s pdg.  Diuresis started in ED w/ lasix 40 mg IV, may help pt become more comfortable.  She will be admitted to Pulmonary Service, telemetry.        IMPRESSION:  SOB 2/2 R heart failure.  Chest pain, unclear etiology.      Ernestine Conrad, MD  06/06/11 2024

## 2011-06-06 NOTE — ED Floor Report (Signed)
Diana Chambers is a 61 year old female    Chief Complaint   Patient presents with    Shortness of Breath     sob x 2 days, also pain in mid chest and "band" feeling with coughing, pt is a flolan pt x 12 years. pt unable to sleep on right side.       Admitted for PULMONARY HYPERTENSION    Code Status:  Please refer to In-pt admitting doctors orders     Past Medical History   Diagnosis Date    Sjoegren syndrome 07/12/2009    Pulmonary HTN        No past surgical history on file.    Allergies: Allopurinol; Levaquin; Ofloxacin; and Sulfa drugs    Level of Care : Telemetry    Fall Risk: yes    Skin issues:  no    >> If yes, note areas of skin breakdown. See appropriate photos.      Ambulatory:  yes    Sitter needed: no    Suicide Risk :  no    Isolation Required: no     >> If yes , what type of isolation:    Is patient in custody?  no    Is patient in restraints? no    Is patient on Heparin? no If yes, complete below:   Bolus=    Units      Units/hr   @   Mls/hour      Time of next PT/PTT draw:          Brief Summary of ED Visit (to include focused assessment and neuro status):  Pt AXO x 4 and has SOB, Respirations even and labored    61 year-old female with history of pulmonary HTN  Pt to ED with c/c of increased SOB x2 days.  Pt recently took a road trip cross country to [place] and back.  Pt and pt's husband decided to drive back to [facility] instead of staying in [place] when pt's symptoms worsened.  She reports that they took numerous pit stops to rest and stretch, some swelling in ankles, but relieved when propped up on pillows.    Pt speaking in full sentences.    She reports that SOB is unprovoked and occurs even at rest.  She has mid-sternal CP which radiates to upper epigastric/diaprhagm area.    Yesterday she coughed up "phlegm that looked like white paint" with increased pain in chest.    Baseline SpO2 92% at home, does not use home O2.    Placed on cardiac monitor with audible alarms.  NSR  with HR in 70's, BP 120's systolic, 70's diastolic, SpO2 90-93% on RA.    Chest xray and U/S DVT done.      See  RN SHIFT ASSESSMENT ADULT  for full assessment, exceptions will be listed.    Cardiac rhythm:  NSR    Oxygen Delivery: None    Filed Vitals:    06/06/11 1845 06/06/11 2017   BP: 135/74    Pulse: 109    Temp: 99 F (37.2 C)    Resp: 22    Height: 5\' 2"  (1.575 m)    Weight: 73.483 kg (162 lb)    SpO2: 92% 93%       Lab Results   Component Value Date    WBC 6.4 06/06/2011    RBC 5.10 06/06/2011    HGB 12.4 06/06/2011    HCT 37.1 06/06/2011  MCV 72.7* 06/06/2011    MCHC 33.4 06/06/2011    RDW 17.9* 06/06/2011    PLT 133* 06/06/2011    MPV 9.8 06/06/2011       Lab Results   Component Value Date    NA 135* 06/06/2011    K 3.6 06/06/2011    CL 102 06/06/2011    BICARB 23 06/06/2011    BUN 23* 06/06/2011    CREAT 1.02* 06/06/2011    GLU 136* 06/06/2011    [place] 10.5 06/06/2011       Lab Results   Component Value Date    PHOS 2.7 06/06/2011    MG 1.9 06/06/2011       Lab Results   Component Value Date    CPK 23 06/06/2011    CKMBH 1.7 06/06/2011    TROPONIN 0.02 06/06/2011       No results found for this basename: PH, PCO2, O2CONTENT, IVHC3, IVBE, O2SAT, UNPH, [place], ARTPH, [place], ARTO2CNT, [place], IABE, [place], UNAPH, UNAPCO2       Results for orders placed during the hospital encounter of 06/06/11   X-RAY CHEST SINGLE VIEW FRONTAL    Narrative:     HISTORY:                  Short of breath with pulmonary hypertension.                                    TECHNIQUE:                   Single frontal view of the chest.                                    COMPARISON:                  CT thorax 06/24/2010.                                    FINDINGS:                  A left jugular central venous catheter terminates near the cavoatrial                   junction.                                    Moderately expanded lungs with no evidence of focal opacity.  Lung lung                   volumes.  Faint diffuse ground glass opacities are  not significantly changed.                    Sharp costophrenic sulci.  The cardiomediastinal silhouette is stable with                   enlarged central pulmonary arteries. The hilar regions and trachea are                   unremarkable. Intact regional skeleton.  IMPRESSION:                  1. Low lung volumes.  A diffuse ground glass opacities are not significant                   change compared to CT thorax 06/24/10 and may related to underlying pulmonary                   hypertension versus interstitial lung disease.                                    2.  Enlarged central pulmonary arteries compatible with pulmonary arterial                   hypertension.                                                      I have reviewed the images and agree with the resident's interpretation.       RADIOLOGIC STUDIES NOT COMPLETED: None  (none unless otherwise noted)    Active PICC Line / CVC Line / PIV Line / Drain / Airway / Intraosseous Line / Epidural Line / ART Line / Line Type / Wound    Name: Placement date: Placement time: Site: Days:    Peripheral IV - 20 G Left Antecubital 06/06/11  1913  Antecubital  less than 1            Any significant events and interventions with responses:  None      Floor nurse informed that report is ready for review.  Opportunity to answer questions with floor RN face to face or by phone. ER number is [phone] . ER RN Idelle Jo to be contacted for any questions.    Has this report been updated?  no  By Who:   Time:   Additional information by updated user:

## 2011-06-06 NOTE — H&P (Signed)
Pulmonary Vascular Consult Note:    Reason for Consult: Pulm HTN, Hypoxia  Consulting Physician: Dr. 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  She was able to do that and has been on a trip since then with improvement in her resp sx.  She began feel a chest tightness, substernal, positional, worse with coughing, but not worse lying supine 3days ago while they were driving through [employer].  They drove to their home in [place] yesterday and then to [facility] today.  She noticed her SpO2 dropped to 82% with her HR around 102 yesterday.  Denies any cough, fevers/chills.  Her LE edema has been stable, minimal for the last few days.  She has been taking for normal dose of lasix.  They have to stop every for her to go the bathroom, never driving longer than this without an ambulatory break.  Her husband has noticed a slight horseness of her voice which he says always happens when her PAH gets bad.    Review of Systems:   A full 12-point review of systems was performed and the pertinent positives and negatives were mentioned in the HPI, all other review of systems were negative.     Past Medical History:  Past Medical History   Diagnosis Date   . 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 Pulmonary HTN    Home Meds:  1. Aldactone 50mg  daily.   2. Calcium with vitamin D.   3. Digoxin 0.25 mg.   4. Lasix 120 mg p.o. daily.   5. Medroxyprogesterone 1.25 mg p.o. daily.   6. Potassium chloride 20 mEq p.o. 3 times a day,   7. Sildenafil 80 mg p.o. t.i.d.   8. Coumadin as directed by [facility]. INR's have been therapeutic  9. Epoprostenol continuous infusion 29 ng/kg/min, dosing weight 86kg, 68mL/24hrs at a  concentration of 45,000ng/mL    Past Surgical History:  denies  Family History:  No hx of pulm HTN  Social History:  History   Substance Use Topics   . Smoking status: Former Smoker -- 0.50 packs/day for 5 years     Quit date: 01/13/1978   . Smokeless tobacco: Never Used   . Alcohol Use: No     Allergies   Allergen Reactions   . Allopurinol Hives   . Levaquin Swelling and Other     Severe joint pain     . Ofloxacin Nausea and Vomiting   . Sulfa Drugs Unspecified   Physical Examination:  BP 135/74  Pulse 109  Temp(Src) 99 F (37.2 C)  Resp 22  Ht 5\' 2"  (1.575 m)  Wt 73.483 kg (162 lb)  BMI 29.62 kg/m2  SpO2 92%   Oxygen Therapy  SpO2: 92 % on RA       General: pleasant, 1-2sentence dyspnea  Eyes:  PERRL,  Conjunctivae are normal  Oropharynx: without any lesions,  good dentition  Neck:  Neck supple. No cervical or supraclavicular lymphadenopahty.  No JVD  Cardiovascular:  Regular, tachy, no murmurs.  Respiratory: bibasilar rales, no wheezes  Gastrointestinal: BS normal.  Abdomen soft, non-tender.  No masses or organomegaly.  Peripheral Vascular:  Trace LE edema  Skin:  Slight flush of  her upper body  Neuro: Cranial Nerves grossly intact, Gait normal. Sensation and strength grossly normal.  Bedside U/S: IVC 15mm without any resp collapse, no pericardial effusion  Review of Laboratory Data: personally reviewed and significant for  CBC:  Lab Results   Component Value Date    WBC 6.4 06/06/2011    HGB 12.4 06/06/2011    HCT 37.1 06/06/2011    PLT 133 06/06/2011    PLT 179 09/12/2008     CHEM:  Lab Results   Component Value Date    NA 135 06/06/2011    K 3.6 06/06/2011    CL 102 06/06/2011    BICARB 23 06/06/2011    BUN 23 06/06/2011    CREAT 1.02 06/06/2011    GLU 136 06/06/2011    [place] 10.5 06/06/2011     COAG:  Lab Results   Component Value Date    PT 30.8 06/06/2011    PTT 36.4 06/06/2011    INR 2.8 06/06/2011     ECG: sinus 105bpm, QTc 417, PR 208.  Stable 1st degree block from 05/2009.    Review of Radiology Studies: personally  reviewed and significant for  TTE 04/16/2011: Normal left ventricular size and systolic function. Normal diastolic function. Abnormal septal motion suggesting presence right ventricular volume overload. Right ventricle is moderately enlarged with overall normal function, measured TAPSE is 14 mm with global RV systolic function, qualitatively, appears preserved. Estimated peak PA and RV systolic pressure 92+ CVP, compared to previous study 06/17/2010, no significant change.  CXR: catheter at [facility] jxn, no effusions, stable cardiomegaly with enlarged PA's that are slightly more prominent since 06/2010.    IMPRESSION AND PLAN: 61 y/o female with hx of PAH and Sjogren's syndrome who presents with chest discomfort, SOB and tachycardia with slight fluid overload by exam.  -SOB/Chest Tightness/Tachycardia/PAH: appears fluid overloaded on exam though mildly so.  Unclear what tipped her over. Thromboembolic disease seems unlikely given her therapeutic INR and negative LE dopplers.   -TTE in AM   -diuresis, goal 1L neg   -cont flolan/sildenafil/coumadin/spironolactone  -Code:Full  Feeding: Cardiac Diet  Analgesia: tylenol  Sedation: n/a  Thromboembolic prophylaxis: coumadin, ambulation  Head-of-bed elevation: yes  Stress Ulcer prevention: not indicated  Glucose control: n/a  Lytes: replete K    Financial controller  Pulm/Critical Care Fellow  Pgr: [phone]

## 2011-06-06 NOTE — ED Notes (Signed)
US at bedside

## 2011-06-06 NOTE — ED Notes (Addendum)
The patient is on a flolan CADD pump. Pump is "RUN" mode upon arrival.  [place] volume is: 61.5 mls  Given: 37.55 mls  The pump delivers in 24 hours is : 80 ml per 24 hrs  The dosage in nanograms/per/kg is: 29  Medication was last changed 0745 this AM. Due to be changed 0745 on 06/07/11.  How many bottles used to mix Flolan: 3  The bottle colors are: red  Pt's weight is: 162 lbs  Dosing weight is: 86 kg  Pharmacy paged  Charge RN informed.  Verified settings and that pump is in RUN mode with  RN N. Andrey Campanile, RN  MD informed of patient's arrival.  The pt does have a back up pump with them.

## 2011-06-06 NOTE — ED Notes (Addendum)
Pharmacy at bedside.  Flolan rate chart at bedside.

## 2011-06-06 NOTE — ED Notes (Signed)
U/S DVT being done at bedside.

## 2011-06-06 NOTE — ED Notes (Signed)
Pt resting on gurney, no complaints at this time.    CP remains, but is tolerable for pt.  She has not been coughing, but remains short of breath.    Updated on plan of care --> MD re-evaluation, admission to hospital, inpatient bed assignment.  Agreeable to plan.

## 2011-06-06 NOTE — ED Notes (Signed)
Spoke with Raynelle Fanning RN on 4A. Aware report is in EPIC. Pt to be xferred after pulmonary finished speaking with pt.

## 2011-06-07 DIAGNOSIS — R600 Localized edema: Secondary | ICD-10-CM

## 2011-06-07 DIAGNOSIS — R079 Chest pain, unspecified: Secondary | ICD-10-CM

## 2011-06-07 LAB — BASIC METABOLIC PANEL, BLOOD
BUN: 22 mg/dL — ABNORMAL HIGH (ref 6–20)
Bicarbonate: 22 mmol/L (ref 22–29)
Calcium: 9.5 mg/dL (ref 8.6–10.5)
Chloride: 107 mmol/L (ref 98–107)
Creatinine: 1.01 mg/dL — ABNORMAL HIGH (ref 0.51–0.95)
GFR: 56 mL/min
Glucose: 82 mg/dL (ref 70–115)
Potassium: 4 mmol/L (ref 3.5–5.1)
Sodium: 140 mmol/L (ref 136–145)

## 2011-06-07 LAB — ECG 12-LEAD
QRS INTERVAL/DURATION: 96 ms
VENTRICULAR RATE: 105 {beats}/min

## 2011-06-07 LAB — 2D ECHO WITH IMAGE ENHANCEMENT AGENT IF NECESSARY
Cardac Output: 9.8 L/min — ABNORMAL HIGH (ref 4.0–8.0)
Cardiac Index: 5.7 L/min/m2 — ABNORMAL HIGH (ref 2.8–4.2)
LA Volume Index: 17 mL/m2 (ref 16–28)
LV Ejection Fraction: 72 % (ref >50)
PA Pressure: 88 mm Hg + CVP — ABNORMAL HIGH (ref 20–30)

## 2011-06-07 LAB — PROTHROMBIN TIME, BLOOD
INR: 2.6
PT,Patient: 27.6 s — ABNORMAL HIGH (ref 9.7–12.5)

## 2011-06-07 LAB — TROPONIN T, BLOOD: Troponin T: 0.02 ng/mL (ref <0.01)

## 2011-06-07 LAB — MAGNESIUM, BLOOD: Magnesium: 1.9 mg/dL (ref 1.7–2.6)

## 2011-06-07 LAB — CKMB+INDEX (NO CPK), BLOOD: CK-MB: 1.3 ng/mL (ref 0.0–2.8)

## 2011-06-07 LAB — CPK-CREATINE PHOSPHOKINASE, BLOOD: CPK: 24 U/L (ref 0–175)

## 2011-06-07 MED ORDER — FUROSEMIDE 40 MG OR TABS
80.0000 mg | ORAL_TABLET | Freq: Every day | ORAL | Status: DC
Start: 2011-06-07 — End: 2011-06-08
  Administered 2011-06-07 – 2011-06-08 (×2): 80 mg via ORAL
  Filled 2011-06-07 (×2): qty 2

## 2011-06-07 MED ORDER — FUROSEMIDE 40 MG OR TABS
40.0000 mg | ORAL_TABLET | Freq: Every evening | ORAL | Status: DC
Start: 2011-06-07 — End: 2011-06-08
  Administered 2011-06-07: 40 mg via ORAL
  Filled 2011-06-07: qty 1

## 2011-06-07 NOTE — Procedures (Signed)
 PERFORMED ON - 06/07/2011 05:28:17;   DONE BY - Kerin Salen, ELVIRA;  PROCEDURE - PDP EVALUATION;   ADMITTING DIAGNOSIS: SOB;   CODE STATUS CHECKED: YES;   HEMOGLOBIN: 12.4;   CHEST EXCURSION: SYMMETRICAL;   PRE-TITR FIO2 OR L/M: 1L;   PRE-TITR SAO2: 94;   TITRATED FIO2 OR L/M: 21%;   POST TITRATION SAO2: 85;   CHEST X-RAY: 06/06/11-LOW LUNG VOLUMES, GROUND GLASS OPACITIES;   MD REQUEST #1: O2;   RCP RECOMMENDATION: #1: O2 PROTOCOL;   INDICATION: #1: O: TO KEEP SA02 >92;   EVALUATION OUTCOME: #1: AS RCP RECOMMENDED;   ELECTRONIC SIGNATURE DERIVED FROM A SINGLE CONTROLLED ACCESS PASSWORD:   Fabian Sharp ; 06/07/2011 05:28:17

## 2011-06-07 NOTE — Procedures (Signed)
 PERFORMED ON - 06/07/2011 11:00:07;   DONE BY - Dairl Ponder;  PROCEDURE - OXYGEN-LOW FLOW;   PROTOCOL DRIVEN: YES-MD INITIATED;   OXYGEN CHECK: DONE;   ADVERSE REACTIONS: NONE;   ELECTRONIC SIGNATURE DERIVED FROM A SINGLE CONTROLLED ACCESS PASSWORD:   Dairl Ponder ; 06/07/2011 11:00:07

## 2011-06-07 NOTE — Procedures (Signed)
 PERFORMED ON - 06/07/2011 04:50:00;   DONE BY - Kerin Salen, ELVIRA;  PROCEDURE - OXYGEN-LOW FLOW;   PROTOCOL DRIVEN: YES-MD INITIATED;   OXYGEN CHECK: DONE;   O2 DEVICE: CANNULA-NASAL;   LITERS/MIN: 1;   ADVERSE REACTIONS: NONE;   ELECTRONIC SIGNATURE DERIVED FROM A SINGLE CONTROLLED ACCESS PASSWORD:   Fabian Sharp ; 06/07/2011 05:27:43

## 2011-06-07 NOTE — Interdisciplinary (Signed)
Pt admitted from ED. SOB, hx pulmonary HTN on flolan gtt x12 years. Pt received iv lasix in ED. Alert oriented, l/s diminished. Oriented to room, call bell in reach. PADB done. Belongings w/ pt.  pts own Backup CADD pump in room.

## 2011-06-07 NOTE — Interdisciplinary (Signed)
06/07/11 1533   Patient Information   Why is Patient in the Hospital? 61 yo female with PAH on Flolan, admitted overnight for CP, SOB and volume overload   Prior to Level of Function Ambulatory/Independent with ADL's   Referral To   Financial Resources Medicare;Other (Comment)  ([facility] A/B; [employer] is secondary)   Discharge Planning   Living Arrangements Spouse / significant other   Support Systems Spouse / significant other   Type of Residence Private residence  (1 level home with 1 step into the house)   Patient expects to be discharged to: Home with spouse in [place].   Do you have transportation home?  Yes  (Spouse drives)   If pt needs home O2, will need O2 sat in RA.  No dc needs assessed at this time but CM will continue follow up for any future needs.

## 2011-06-07 NOTE — Progress Notes (Signed)
Pulmonary HTN Progress Note    61 yo female with PAH on Flolan, admitted overnight for CP, SOB and volume overload.  Diuresed well overnight.  Pt feels much improved today, now on RA again.    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    PAH/acute on chronic RH failure:  - transition to PO lasix as pt diuresed well, feels improved  - no longer requires O2  - encourage to ambulate, OOB  - continue sildenafil, flolan  - TTE pending given chest pain concern for effusion  - monitor lytes    FULL CODE      D/w Dr. Clelia Croft.      Phyllis Ginger. [name], MD  PCCM Fellow  Pager [phone]  PID [id number]        Pulmonary Hypertension Attending Addendum:  I have seen and examined Florencia Reasons with Dr. [name] and agree with the assessment and plan as described.  I discussed her admission extensively with Dr. Merryl Hacker via telephone last evening.  This is a 61 year old female with Sjogren disease associated pulmonary arterial hypertension, on chronic epoprostenol and sildenafil, admitted with several days of increasing shortness of breath and chest pain.  Upon examination last night, she was found to be slightly fluid overloaded and intravenous diuretics were initiated.  She admits to mild dietary indiscretion associated with her recent cross-country road trip.  This morning, she reports marked improvement in her chest discomfort and shortness of breath, as well as her lower extremity edema.    Exam  notable for a very pleasant, talkative female accompanied by her husband, in no acute distress.  Her skin is diffusely flushed consistent with epoprostenol use.  Cardiac exam reveals a tricuspid regurgitation murmur.  Lungs are notable for atelectatic sounding crackles at the bases.  Lower extremities are notable for mild pitting edema, apparently improved since last night.     - Plan is as described by Dr. [name].   - Sjgren disease associated pulmonary arterial hypertension.   - Fluid overload, leading to hypoxemia, dyspnea, and chest discomfort.  This has resolved significantly with one dose of intravenous diuretics.   - Dietary indiscretion, not intentional, associated with recent travel.   - Hypoxemia, resolved with diuresis.   - Plan to continue diuresis and a low-sodium diet.  I counseled Ms. Eisen and her husband extensively about how diuretics function and the relation to dietary sodium.   - Plan for transthoracic echocardiogram later today due to acute decompensation.   - Electrolyte replacement and monitoring.    Ellen Henri, M.D.  Pulmonary and Critical Care  Pager: [phone]  PID: [id number]

## 2011-06-07 NOTE — Plan of Care (Signed)
Problem: Falls - Risk of  Goal: Absence of falls  Outcome: Met  Patient has not fallen this evening, sr up x2 call bell in reach. Bed in lowest position, brakes on. Pt aware to call for assist if needed and getting oob. Will continue hourly rounds for safety.    Problem: Fluid Volume - Excess  Goal: Vital signs within specified parameters  Outcome: Met  Pt vitals signs stable, 120-130s/60s. Pt receiving po digoxin and revatio for evening dose. HR sinus tach 100. No JVd noted. Pt continues on flolan gtt set up with hospital flolan gtt/pts spare pump to Rx settings.     Problem: Tissue Perfusion - Cardiopulmonary, Altered  Goal: Hemodynamic stability  Outcome: Met  Pt csm intact.  Voiding >99ml/hr. Current H/H 12/37. Continue to monitor.     Problem: Breathing Pattern - Ineffective  Goal: Respiratory rate, rhythm and depth return to baseline  Outcome: Not Met  Pt having 3-4 word dyspnea. Sob with exertion. Felt more sob past few days with swelling in ankles. Pt continues on chronic flolan gtt. Given lasix iv in ED on admission. Pt diuresing >1L total so far. L/s sl diminished clear anteriorly. Dry cough. satting 93% on room air.

## 2011-06-07 NOTE — Interdisciplinary (Signed)
New cassette/flolan/tubing intiated per pharmacy orders. 29ng/kg/min, 3.1ml/hr. Double checked with Ted Mcalpine.

## 2011-06-07 NOTE — Plan of Care (Signed)
Problem: Falls - Risk of  Goal: Absence of falls  Outcome: Met  Assessed and monitored pt's safety, bedrails x2 up while in bed, bed locked and in low position, call light and belongings within reach, needs attended, instructed to call for assistance.Pt uses call light as instructed. Pt ambulatory and has steady gait.  No incident of fall or injury at this time. will continue to monitor pts safety.    Problem: Fluid Volume - Excess  Goal: Absence of edema; peripheral and/or pulmonary  Outcome: Met  Pt vitals signs stable, Pt receiving po revatio dose. HR sinus tach 100. No JVd noted. Pt on Floaln infusion via CADD pump to pts left [place].    Problem: Tissue Perfusion - Cardiopulmonary, Altered  Goal: Hemodynamic stability  Outcome: Met  Assessed. Pt on tele monitoring. Instructed patient to report chest pain/palpitations/pressure. Pt heart rhythm shows SR-ST w/ HR:90-105.  Denies CP or palpitation during the shift. trace edema noted on LEs.  pulses palpable with good capillary refill. will continue to monitor pt. Monitor Flolan Administration. Ice packs changed Q6h and prn. Flolan double checked with change of shift and with cartridge change by X2 RN's.  Additional CADD pump at bedside at all times. Flolan infusing without interruption to left [place]vi[place];  Flolan Concetration:   45,000 NG/ml, rate:  29NG/Kg/min or 80 ml/24hrs.  Flolan infusing without side effects. Will continue to monitor Pt.     Problem: Breathing Pattern - Ineffective  Goal: Respiratory rate, rhythm and depth return to baseline  Outcome: Met  Assessed respiratory status, monitored for signs and symptoms of hypoxia and monitor SaO2. Instructed patient to report any breathing difficulty including shortness of breath.  Encouraged use of incentive spirometer while awake.  Lungs sounds are clear and diminished on the bases. Breathing in unlabored at rest. Pt remains on RA saturation ranges 92-94% . Patient denies breathing difficulty at this time and is  splinting chest when coughing. No acute respiratory distress noted.

## 2011-06-08 LAB — MAGNESIUM, BLOOD: Magnesium: 2 mg/dL (ref 1.7–2.6)

## 2011-06-08 LAB — BASIC METABOLIC PANEL, BLOOD
BUN: 22 mg/dL — ABNORMAL HIGH (ref 6–20)
Bicarbonate: 24 mmol/L (ref 22–29)
Calcium: 9.9 mg/dL (ref 8.6–10.5)
Chloride: 101 mmol/L (ref 98–107)
Creatinine: 1.01 mg/dL — ABNORMAL HIGH (ref 0.51–0.95)
GFR: 56 mL/min
Glucose: 86 mg/dL (ref 70–115)
Potassium: 3.6 mmol/L (ref 3.5–5.1)
Sodium: 135 mmol/L — ABNORMAL LOW (ref 136–145)

## 2011-06-08 MED ORDER — POTASSIUM CHLORIDE CRYS CR 10 MEQ OR TBCR
40.00 meq | EXTENDED_RELEASE_TABLET | Freq: Once | ORAL | Status: AC
Start: 2011-06-08 — End: 2011-06-08
  Administered 2011-06-08: 40 meq via ORAL
  Filled 2011-06-08: qty 4

## 2011-06-08 NOTE — Procedures (Signed)
 PERFORMED ON - 06/08/2011 11:31:35;   DONE BY - Dairl Ponder;  PROCEDURE - OXYGEN-LOW FLOW;   PROTOCOL DRIVEN: YES-MD INITIATED;   OXYGEN CHECK: DONE;   ADVERSE REACTIONS: NONE;   ELECTRONIC SIGNATURE DERIVED FROM A SINGLE CONTROLLED ACCESS PASSWORD:   Dairl Ponder ; 06/08/2011 11:31:35

## 2011-06-08 NOTE — Discharge Summary (Signed)
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 Chest pain [786.50]   . Bilateral edema of lower extremity [782.3]   . 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 Primary pulmonary hypertension [416.0]      Resolved Hospital Problems    Diagnosis   No resolved problems to display.   Volume overload, shortness of breath    Additional Hospital Diagnoses ("rule out" or "suspected" diagnoses, etc.):  None    Principal Procedure During This Hospitalization (required):  None    Other Procedures Performed During This Hospitalization (required):  Echocardiography    Procedure results are available in Chart Review in Epic.  For those providers external to [place], the key procedure results are listed below:  No pericardial effusion.  PAP 88, no change from prior TTE.    Consultations Obtained During This Hospitalization:  None    Key consultant recommendations:  NA    Reason for Admission to the Hospital / History of Present Illness: Per Dr. 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 She was able to do that and has been on a trip since then with improvement in her resp sx. She began feel a chest tightness, substernal, positional, worse with coughing, but not worse lying supine 3days ago while they were driving through [employer]. They drove to their home in [place] yesterday and then to [facility] today. She noticed her SpO2 dropped to 82% with her HR around 102 yesterday. Denies any cough, fevers/chills. Her LE edema has been stable, minimal for the last few days. She has been taking for normal dose of lasix. They have to stop every  for her to go the bathroom, never driving longer than this without an ambulatory break. Her husband has noticed a slight horseness of her voice which he says always happens when her PAH gets bad.      Hospital Course by Problem (required):  PAH/volume overload - Pt was admitted with SOB and mild hypoxia due to volume overload and acute on chronic RHF.  Pt was diuresed with IV lasix the night of admission then placed back on her home lasix on 5/25.  Pt diuresed almost 4L in-house.  She is discharged on all of her home meds.  Her Flolan dose was not changed in-house - she remains on 29 ng/kg/min.   A repeat TTE was performed to look for pericardial effusion as pt had chest pain on admission.  There was no effusion, TTE was largely unchanged from prior but shows slightly improved PAP at 88.  Pt discharged home with her husband in good condition.      Tests Outstanding at Discharge Requiring Follow Up:  None    Discharge Condition (required):  Improved.    Key Physical Exam Findings at Discharge:  No significant physical examination findings at the time of discharge.    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    Discharge Diet:  Low-salt.    Discharge Medications:  Current Discharge Medication List      CONTINUE these medications which have NOT CHANGED    Details   ALDACTONE 50 MG OR TABS one tab twice daily      Cholecalciferol (VITAMIN D) 1000 UNIT CAPS Take 2,000 Units by mouth daily.      digoxin (LANOXIN) 0.25 MG tablet Take 1 tablet by mouth daily.  Qty: 30 tablet, Refills: 5    Associated Diagnoses: Primary pulmonary hypertension      Estradiol (DIVIGEL) 0.1 % GEL As directed      FLOLAN 1.5 MG IV SOLR 29ng/kg/min         FOLIC ACID 400 MCG OR TABS 1 TABLET DAILY      furosemide (LASIX) 80 MG tablet Take 1.5 tablets by mouth daily.  Qty: 45 tablet, Refills: 6    Associated Diagnoses: Primary pulmonary hypertension      medroxyPROGESTERone (PROVERA) 2.5 MG tablet Take 1.25 mg by mouth daily.      potassium chloride (KLOR-CON M20) 20 MEQ tablet Take 1 tablet by mouth 3 times daily.  Qty: 90 tablet, Refills: 6    Associated Diagnoses: Primary pulmonary hypertension      sildenafil (REVATIO) 20 MG tablet Take 4 tablets by mouth 3 times daily. Take 4 tablets three times daily    Qty: 90 tablet, Refills: 5    Associated Diagnoses: Primary pulmonary hypertension      warfarin (COUMADIN) 5 MG tablet Take 1 tablet by mouth daily. 5mg  x 5days a week 2.5mg  2 days/wk  Qty: 45 tablet, Refills: 5    Associated Diagnoses: Primary pulmonary hypertension         STOP taking these medications       amoxicillin-clavulanate (AUGMENTIN) 875-125 MG per tablet Comments:   Reason for Stopping:               Allergies:  Allergies   Allergen Reactions   . Allopurinol Hives   . Levaquin Swelling and Other     Severe joint pain     . Ofloxacin Nausea and Vomiting   . Sulfa Drugs Unspecified       Discharge Disposition:  Home.    Discharge Code Status:  Full code / full care  This code status is not changed from the time of admission.    Follow Up Appointments:    Scheduled appointments:  No future appointments.    For appointments requested for after discharge that have not yet been scheduled, refer to the Post Discharge Referrals section of the After Visit Summary.    Discharging Physician's Contact Information:  Pager number [phone] for Dr. [name].  Discharging Attending:  Dr. Ellen Henri.

## 2011-06-08 NOTE — Progress Notes (Signed)
Pulmonary HTN Progress Note    No overnight events.  Pt feels improved today.  Ambulating well.    BP 127/63  Pulse 89  Temp(Src) 98.9 F (37.2 C)  Resp 18  Ht 5\' 2"  (1.575 m)  Wt 69.264 kg (152 lb 11.2 oz)  BMI 27.92 kg/m2  SpO2 96% RA - 2L NC  I/O: - 2.25L  Gen: sitting upright in chair, NAD  Neck: no JVD  Chest: crackles b/l, worse at the bases  CV:  RRR  Abd: soft, NTND  Extr: 1+ LE edema to knees, 2+ radial pulses  Skin:  Erythematous rash      Labs:  K 3.6  Mg 2.0  Cr 1.01  INR 2.7    TTE:  Conclusions:  1) Normal left ventricular size and systolic function.  2) Mild concentric LV hypertrophy.  3) Abnormal septal motion suggesting RV pressure overload.  4) Right ventricle is moderately to severely enlarged with mild depressed  function.  5) Severe pulmonary hypertension.  6) Mild tricuspid regurgitation.  7) Compared to previous study on 04/16/2011, PA pressures are slighly lower.            A/P:   61 yo female with PAH admitted for acute on chronic RH failure due to volume overload, now improved with diuresis.    PAH/acute on chronic RH failure:   - tolerating home lasix regimen well with good UOP  - will replete K this morning  - wean O2 as tolerated  - encourage to ambulate, OOB   - continue sildenafil, flolan   - TTE shows improved PAP from prior    Dispo:  Stable for d/c home today.    FULL CODE     D/w Dr. Clelia Croft.       Phyllis Ginger. [name], MD   PCCM Fellow   Pager [phone]   PID [id number]        Pulmonary Hypertension Attending Addendum:  I have seen and examined Diana Chambers with Dr. [name] and agree with the assessment and plan as described.  Continued diuresis overnight, with improvement in dyspnea, oxygenation, edema, and chest discomfort.  Looking forward to going home today, and asking about arranging follow-up.     - Plan is as described by Dr. [name].   - Sjogren syndrome, with associated pulmonary artery hypertension.   - Volume overload, improved with 2 days of diuresis (only on oral therapy  the last 24 hours).   - We spent a long time discussing symptoms, how to check and document daily weights, determining her "dry weight," etc.   - Dietary adherence discussed.   - Will discharge home today, with follow-up with Dr. Paulla Fore.  Will contact Dr. Paulla Fore and nurse Cleon Dew to arrange for this as needed.    40 minutes were spent arranging for discharge, discussing return to ED precautions, and arranging for follow-up.    Ellen Henri, M.D.  Pulmonary and Critical Care  Pager: [phone]  PID: [id number]

## 2011-06-08 NOTE — Plan of Care (Signed)
Problem: Falls - Risk of  Goal: Absence of falls  Outcome: Met  Pt has not fallen. Fall precautions in place. Bed alarm active. Pt understands need to use call bell when in need of assistance.  Goal: Knowledge of fall prevention  Outcome: Met  Pt has not fallen. Fall precautions in place. Bed alarm active. Pt understands need to use call bell when in need of assistance.    Problem: Fluid Volume - Excess  Goal: Absence of edema; peripheral and/or pulmonary  Outcome: Not Met  Pt with trace lower extremity edema. PO diuretics.   Goal: Vital signs within specified parameters  Outcome: Met  Vitals WDL.   Goal: Balanced intake and output  Outcome: Met  See doc flow sheet.    Problem: Tissue Perfusion - Cardiopulmonary, Altered  Goal: Hemodynamic stability  Outcome: Met  Vitals WDL at this time.   Goal: Early recognition of deterioration  Outcome: Met  Pt on telemetry, vitals every 4 hours.     Problem: Breathing Pattern - Ineffective  Goal: Respiratory rate, rhythm and depth return to baseline  Outcome: Not Met  Pt reports improvement on SOB.

## 2011-06-08 NOTE — Discharge Instructions (Signed)
You were admitted to the hospital for shortness of breath.  This was due to increased volume.  You were given IV lasix to help get the fluid off.  Please continue to take all of your home medications, including your lasix, as directed.  Please take care to keep your salt intake to a minimum because this is what caused fluid to accumulate in your body.    Diet: low salt    Activity: as before    Please follow up with Dr. Paulla Fore as previously scheduled.  Please continue your Flolan at your current dose.  This was not changed in the hospital.

## 2011-06-08 NOTE — Plan of Care (Signed)
Problem: Falls - Risk of  Goal: Absence of falls  Outcome: Met  Assess fall risk Q4h and prn.  Call light is within reach.  Bed rails up X2.  Bed is in lowest position and locked.  Non-skid footware in place.  Pt is made aware to call nurse for assistance as needed.  Toileting assistance is offered Q2h and prn.    Patient remains free from falls.  Pt is A&OX3, verbalizes an understanding to call nurse for assistance as needed.   Pt's gait is steady and she is ambulating around the room  independently.  Pt voiding to bathroom with X1 assist.  RN will continue to monitor and to update as needed.      Goal: Knowledge of fall prevention  Outcome: Met  Please see previous note.    Problem: Fluid Volume - Excess  Goal: Absence of edema; peripheral and/or pulmonary  Outcome: Met  Trace edema noted bilaterally on lower extremities.  Pt. On diuretic to help decrease the edema  (please see MAR for time and dose of administration).  RN will continue to monitor patient closely.  Goal: Vital signs within specified parameters  Outcome: Met  Please see previous note.  Goal: Balanced intake and output  Outcome: Met  Please see previous note.  Goal: Urine output within specified parameters  Outcome: Met  Please see previous note.    Problem: Tissue Perfusion - Cardiopulmonary, Altered  Goal: Hemodynamic stability  Outcome: Met  Assess per unit policy standard and procedures.  Pt reminded to notify RN with chest pain, pressure, and palpitations. Patient is on continuous telemetry monitoring.      SR   Filed Vitals:     06/08/11 0600 06/08/11 0855 06/08/11 1131 06/08/11 1257   BP: 127/63 126/53   118/54   Pulse: 89 97   95   Temp: 98.9 F (37.2 C)     98 F (36.7 C)   Resp: 18 18   18    Height:           Weight: 69.264 kg (152 lb 11.2 oz)         SpO2: 96% 94% 91% 94%   Trace edema noted bilaterally on lower extremities.   All pulses palpable.  Skin is warm and dry.  Patient denies chest pain, pressure, and palpitations.    AM Labs:   K+       Will continue to monitor and to update as needed.       Goal: Early recognition of deterioration  Outcome: Met  Please see previous note.    Problem: Breathing Pattern - Ineffective  Goal: Respiratory rate, rhythm and depth return to baseline  Outcome: Met  Assess respiratory status Q4h and prn.  Pt is currently on RA with oxygen saturation of 94.  Lung sounds are clear with diminished bases. Respiratory rate is even and non-labored.  Patient denies cough and shortness of breath at this time.  No respiratory distress noted.  RN will continue to monitor and to update as needed.         Problem: Discharge Planning  Goal: Participation in care planning  Outcome: Met  1410 Pt discharged to home, transported by husband. Pt is A&OX3, VSS.  Peripheral IV removed intact - no bleeding/hematoma noted.  All discharge instructions reviewed with patient.  Discussed with patient - diet, activity, medications, and pain control.  All questions answered regarding discharge.  Pt is aware of follow-up appointment with Dr. Paulla Fore.  Influenza/PNA  vaccines not indicated.  Prescriptions filled off site.  All belongings taken with patient.  Pt refused wheelchair transport and walked independently off the floor with husband to car for transport home.  Goal Met.

## 2011-06-08 NOTE — Procedures (Signed)
 PERFORMED ON - 06/08/2011 11:32:13;   DONE BY - Dairl Ponder;  PROCEDURE - PDP RE-EVALUATION;   NEURO STATUS: ALERT AND ORIENTED;   HEARTRATE: 88;   RESP RATE: 17;   BREATH SOUNDS: DIMINISHED T/O;   ADMITTING DIAGNOSIS: P HYPERTENSION;   ORDERING PHYSICIAN: POCH;   CODE STATUS CHECKED: YES;   HEMOGLOBIN: 12.0;   CLUBBING PRESENT: NO;   TRACHEOSTOMY PRESENT: NO;   SOB: NO;   CHEST EXCURSION: SYMMETRICAL;   TITRATED FIO2 OR L/M: RA;   POST TITRATION SAO2: 92;   ABG-FIO2: NO ABG;   CHEST X-RAY: MOD EXP LUNGS NO FOCAL OPACITY;   MD REQUEST #1: OXYGEN;   RCP RECOMMENDATION: #1: O2 PROTOCOL;   INDICATION: #1: O: TO KEEP SA02 >92;   EVALUATION OUTCOME: #1: AS RCP RECOMMENDED;   ADVERSE REACTIONS: NONE;   ELECTRONIC SIGNATURE DERIVED FROM A SINGLE CONTROLLED ACCESS PASSWORD:   Dairl Ponder ; 06/08/2011 11:32:13

## 2011-06-08 NOTE — Plan of Care (Signed)
Monitor Flolan Administration.Ice packs changed Q6h and prn. Flolan double checked with cartridge change by X2 RN's. Additional CADD pump at bedside at all times. Flolan infusing without interruption to left chest port. Flolan Concentration45,000 ng/ml, rate 29 ng/kg/min or80 ml/24hrs. Flolan infusing without side effects. RN will continue to monitor and to update as needed.

## 2011-06-15 ENCOUNTER — Other Ambulatory Visit (INDEPENDENT_AMBULATORY_CARE_PROVIDER_SITE_OTHER): Payer: Self-pay | Admitting: Emergency Medicine

## 2011-06-15 ENCOUNTER — Inpatient Hospital Stay
Admission: EM | Admit: 2011-06-15 | Discharge: 2011-06-24 | Disposition: A | Discharge status: Home Health | Attending: Pulmonary Medicine | Admitting: Pulmonary Medicine

## 2011-06-15 LAB — URINALYSIS
Nitrite: NEGATIVE
pH: 6 (ref 5.0–8.0)

## 2011-06-15 MED ORDER — SILDENAFIL CITRATE 20 MG OR TABS
80.0000 mg | ORAL_TABLET | Freq: Once | ORAL | Status: AC
Start: 2011-06-15 — End: 2011-06-16

## 2011-06-15 MED ORDER — FUROSEMIDE 10 MG/ML IJ SOLN
60.0000 mg | Freq: Every day | INTRAMUSCULAR | Status: DC
Start: 2011-06-16 — End: 2011-06-18
  Administered 2011-06-16 – 2011-06-18 (×3): 60 mg via INTRAVENOUS
  Filled 2011-06-15 (×3): qty 6

## 2011-06-15 MED ORDER — PROSTACYCLIN CASSETTE CHANGE (~~LOC~~)
Freq: Every day | Status: DC
Start: 2011-06-15 — End: 2011-06-24
  Administered 2011-06-20 – 2011-06-22 (×2): 1
  Filled 2011-06-15: qty 1

## 2011-06-15 MED ORDER — MEDROXYPROGESTERONE ACETATE 2.5 MG OR TABS
1.2500 mg | ORAL_TABLET | Freq: Every day | ORAL | Status: DC
Start: 2011-06-16 — End: 2011-06-24
  Administered 2011-06-16 – 2011-06-24 (×9): 1.25 mg via ORAL
  Filled 2011-06-15 (×9): qty 1

## 2011-06-15 MED ORDER — SILDENAFIL CITRATE 20 MG OR TABS
80.0000 mg | ORAL_TABLET | Freq: Three times a day (TID) | ORAL | Status: DC
Start: 2011-06-16 — End: 2011-06-24
  Administered 2011-06-15 – 2011-06-24 (×26): 80 mg via ORAL

## 2011-06-15 MED ORDER — VITAMIN D 1000 UNIT OR TABS
2000.0000 [IU] | ORAL_TABLET | Freq: Every day | ORAL | Status: DC
Start: 2011-06-16 — End: 2011-06-24
  Administered 2011-06-16 – 2011-06-24 (×9): 2000 [IU] via ORAL
  Filled 2011-06-15 (×9): qty 2

## 2011-06-15 MED ORDER — VANCOMYCIN HCL 1000 MG IV SOLR
1000.0000 mg | Freq: Once | INTRAVENOUS | Status: DC
Start: 2011-06-15 — End: 2011-06-15

## 2011-06-15 MED ORDER — PROSTACYCLIN ICE PACK (~~LOC~~)
Freq: Four times a day (QID) | Status: DC
Start: 2011-06-15 — End: 2011-06-24
  Administered 2011-06-16: 2
  Administered 2011-06-17: 1
  Administered 2011-06-18 – 2011-06-21 (×8): 2
  Administered 2011-06-22: 1
  Administered 2011-06-23 (×2): 2
  Filled 2011-06-15: qty 1

## 2011-06-15 MED ORDER — PROSTACYCLIN TUBING (~~LOC~~)
Status: DC
Start: 2011-06-16 — End: 2011-06-24
  Administered 2011-06-20: 1
  Filled 2011-06-15: qty 1

## 2011-06-15 MED ORDER — SODIUM CHLORIDE 0.9 % IV SOLN
3375.0000 mg | Freq: Once | INTRAVENOUS | Status: DC
Start: 2011-06-15 — End: 2011-06-15

## 2011-06-15 MED ORDER — SODIUM CHLORIDE 0.9 % IJ SOLN (CUSTOM)
3.0000 mL | Freq: Three times a day (TID) | INTRAMUSCULAR | Status: DC
Start: 2011-06-15 — End: 2011-06-24
  Administered 2011-06-17 – 2011-06-24 (×22): 3 mL via INTRAVENOUS

## 2011-06-15 MED ORDER — GLYCINE DILUENT IV SOLN
29.0000 ng/kg/min | INTRAVENOUS | Status: DC
Start: 2011-06-15 — End: 2011-06-24
  Administered 2011-06-15 – 2011-06-24 (×14): 29 ng/kg/min via INTRAVENOUS
  Filled 2011-06-15 (×11): qty 4.5

## 2011-06-15 MED ORDER — POTASSIUM CHLORIDE CRYS CR 10 MEQ OR TBCR
20.0000 meq | EXTENDED_RELEASE_TABLET | Freq: Three times a day (TID) | ORAL | Status: DC
Start: 2011-06-15 — End: 2011-06-24
  Administered 2011-06-15 – 2011-06-24 (×27): 20 meq via ORAL
  Filled 2011-06-15 (×27): qty 2

## 2011-06-15 MED ORDER — AZITHROMYCIN 250 MG OR TABS
500.0000 mg | ORAL_TABLET | Freq: Every day | ORAL | Status: DC
Start: 2011-06-16 — End: 2011-06-23
  Administered 2011-06-16 – 2011-06-23 (×8): 500 mg via ORAL
  Filled 2011-06-15 (×8): qty 2

## 2011-06-15 MED ORDER — VANCOMYCIN HCL 1000 MG IV SOLR
600.0000 mg | Freq: Two times a day (BID) | INTRAVENOUS | Status: DC
Start: 2011-06-16 — End: 2011-06-18
  Administered 2011-06-16 – 2011-06-18 (×4): 600 mg via INTRAVENOUS
  Filled 2011-06-15 (×7): qty 600

## 2011-06-15 MED ORDER — SODIUM CHLORIDE 0.9 % IV SOLN
INTRAVENOUS | Status: DC | PRN
Start: 2011-06-15 — End: 2011-06-24

## 2011-06-15 MED ORDER — FUROSEMIDE 10 MG/ML IJ SOLN
60.0000 mg | Freq: Every day | INTRAMUSCULAR | Status: DC
Start: 2011-06-16 — End: 2011-06-15

## 2011-06-15 MED ORDER — PROSTACYCLIN BATTERIES - PUMP (~~LOC~~)
Status: DC
Start: 2011-06-16 — End: 2011-06-24
  Filled 2011-06-15: qty 1

## 2011-06-15 MED ORDER — VANCOMYCIN HCL 1000 MG IV SOLR
1250.0000 mg | Freq: Once | INTRAVENOUS | Status: AC
Start: 2011-06-15 — End: 2011-06-15
  Filled 2011-06-15: qty 1250

## 2011-06-15 MED ORDER — DIGOXIN 0.125 MG OR TABS
250.0000 ug | ORAL_TABLET | Freq: Every day | ORAL | Status: DC
Start: 2011-06-16 — End: 2011-06-24
  Administered 2011-06-16 – 2011-06-23 (×8): 250 ug via ORAL
  Filled 2011-06-15 (×8): qty 2

## 2011-06-15 MED ORDER — SPIRONOLACTONE 25 MG OR TABS
50.0000 mg | ORAL_TABLET | Freq: Two times a day (BID) | ORAL | Status: DC
Start: 2011-06-15 — End: 2011-06-24
  Administered 2011-06-15 – 2011-06-24 (×18): 50 mg via ORAL
  Filled 2011-06-15 (×18): qty 2

## 2011-06-15 MED ORDER — SODIUM CHLORIDE 0.9 % IV SOLN
3375.0000 mg | Freq: Three times a day (TID) | INTRAVENOUS | Status: DC
Start: 2011-06-15 — End: 2011-06-18
  Administered 2011-06-15 – 2011-06-18 (×8): 3375 mg via INTRAVENOUS
  Filled 2011-06-15 (×8): qty 3375

## 2011-06-15 MED ORDER — FOLIC ACID 1 MG OR TABS
0.5000 mg | ORAL_TABLET | Freq: Every day | ORAL | Status: DC
Start: 2011-06-16 — End: 2011-06-24
  Administered 2011-06-16 – 2011-06-24 (×9): 0.5 mg via ORAL
  Filled 2011-06-15 (×9): qty 1

## 2011-06-15 MED ORDER — WARFARIN SODIUM 5 MG OR TABS
5.0000 mg | ORAL_TABLET | Freq: Every day | ORAL | Status: DC
Start: 2011-06-16 — End: 2011-06-15

## 2011-06-15 MED ORDER — SODIUM CHLORIDE 0.9 % IJ SOLN (CUSTOM)
3.0000 mL | INTRAMUSCULAR | Status: DC | PRN
Start: 2011-06-15 — End: 2011-06-24

## 2011-06-15 MED ORDER — ACETAMINOPHEN 325 MG PO TABS
975.0000 mg | ORAL_TABLET | Freq: Once | ORAL | Status: AC
Start: 2011-06-15 — End: 2011-06-15
  Administered 2011-06-15: 975 mg via ORAL
  Filled 2011-06-15: qty 3

## 2011-06-15 MED ORDER — VANCOMYCIN HCL 1000 MG IV SOLR
1000.0000 mg | Freq: Every day | INTRAVENOUS | Status: DC
Start: 2011-06-16 — End: 2011-06-15
  Administered 2011-06-15: 1000 mg via INTRAVENOUS

## 2011-06-15 MED ORDER — SILDENAFIL CITRATE 20 MG OR TABS
80.0000 mg | ORAL_TABLET | Freq: Three times a day (TID) | ORAL | Status: DC
Start: 2011-06-15 — End: 2011-06-15
  Filled 2011-06-15: qty 4

## 2011-06-16 LAB — ECG 12-LEAD
QRS INTERVAL/DURATION: 94 ms
VENTRICULAR RATE: 114 {beats}/min

## 2011-06-17 LAB — CBC WITH DIFF, BLOOD
ANC-Manual Mode: 8.8 10*3/uL — ABNORMAL HIGH (ref 1.6–7.0)
Abs Lymphs: 1.4 10*3/uL (ref 0.8–3.1)
Abs Monos: 0.3 10*3/uL (ref 0.2–0.8)
Bands: 5 % (ref 0–15)
Hct: 33.4 % — ABNORMAL LOW (ref 34.0–45.0)
Hgb: 11 gm/dL — ABNORMAL LOW (ref 11.2–15.7)
Lymphocytes: 13 % — ABNORMAL LOW (ref 19–53)
MCH: 23.8 pg — ABNORMAL LOW (ref 26.0–32.0)
MCHC: 32.9 % (ref 32.0–36.0)
MCV: 72.3 um3 — ABNORMAL LOW (ref 79.0–95.0)
Monocytes: 3 % — ABNORMAL LOW (ref 5–12)
Myelocytes: 1 %
Number of Cells Counted: 115
Plt Count: 110 10*3/uL — ABNORMAL LOW (ref 140–370)
Plt Est: DECREASED
RBC: 4.62 10*6/uL (ref 3.90–5.20)
RDW: 17.8 % — ABNORMAL HIGH (ref 12.0–14.0)
Segs: 78 % — ABNORMAL HIGH (ref 34–71)
WBC: 10.6 10*3/uL — ABNORMAL HIGH (ref 4.0–10.0)

## 2011-06-17 LAB — MAGNESIUM, BLOOD: Magnesium: 2.1 mg/dL (ref 1.7–2.6)

## 2011-06-17 LAB — BASIC METABOLIC PANEL, BLOOD
BUN: 21 mg/dL — ABNORMAL HIGH (ref 6–20)
Bicarbonate: 24 mmol/L (ref 22–29)
Calcium: 10.8 mg/dL — ABNORMAL HIGH (ref 8.6–10.5)
Chloride: 103 mmol/L (ref 98–107)
Creatinine: 1.01 mg/dL — ABNORMAL HIGH (ref 0.51–0.95)
GFR: 56 mL/min
Glucose: 92 mg/dL (ref 70–115)
Potassium: 3.9 mmol/L (ref 3.5–5.1)
Sodium: 135 mmol/L — ABNORMAL LOW (ref 136–145)

## 2011-06-17 LAB — PROTHROMBIN TIME, BLOOD
INR: 3.2
PT,Patient: 35 s — ABNORMAL HIGH (ref 9.7–12.5)

## 2011-06-17 LAB — LEGIONELLA ANTIGEN, URINE: Legionella Ag Ur: NEGATIVE

## 2011-06-17 LAB — PHOSPHORUS, BLOOD: Phosphorous: 2.6 mg/dL — ABNORMAL LOW (ref 2.7–4.5)

## 2011-06-17 MED ORDER — WARFARIN SODIUM 2.5 MG OR TABS
2.5000 mg | ORAL_TABLET | Freq: Every evening | ORAL | Status: DC
Start: 2011-06-17 — End: 2011-06-20
  Administered 2011-06-17 – 2011-06-20 (×4): 2.5 mg via ORAL
  Filled 2011-06-17 (×4): qty 1

## 2011-06-17 MED ORDER — ACETAMINOPHEN 325 MG PO TABS
650.0000 mg | ORAL_TABLET | Freq: Four times a day (QID) | ORAL | Status: DC | PRN
Start: 2011-06-17 — End: 2011-06-24
  Administered 2011-06-17: 650 mg via ORAL
  Filled 2011-06-17: qty 2

## 2011-06-18 LAB — BASIC METABOLIC PANEL, BLOOD
BUN: 22 mg/dL — ABNORMAL HIGH (ref 6–20)
Bicarbonate: 23 mmol/L (ref 22–29)
Calcium: 10.8 mg/dL — ABNORMAL HIGH (ref 8.6–10.5)
Chloride: 104 mmol/L (ref 98–107)
Creatinine: 0.94 mg/dL (ref 0.51–0.95)
GFR: >60 mL/min
Glucose: 88 mg/dL (ref 70–115)
Potassium: 4.2 mmol/L (ref 3.5–5.1)
Sodium: 134 mmol/L — ABNORMAL LOW (ref 136–145)

## 2011-06-18 LAB — CBC WITH DIFF, BLOOD
ANC-Manual Mode: 7.3 10*3/uL — ABNORMAL HIGH (ref 1.6–7.0)
Abs Eosinophils: 0.1 10*3/uL (ref 0.0–0.5)
Abs Lymphs: 1 10*3/uL (ref 0.8–3.1)
Abs Monos: 0.1 10*3/uL — ABNORMAL LOW (ref 0.2–0.8)
Bands: 26 % — ABNORMAL HIGH (ref 0–15)
Eosinophils: 1 % (ref 1–7)
Hct: 31.1 % — ABNORMAL LOW (ref 34.0–45.0)
Hgb: 10.1 gm/dL — ABNORMAL LOW (ref 11.2–15.7)
Lymphocytes: 12 % — ABNORMAL LOW (ref 19–53)
MCH: 23.4 pg — ABNORMAL LOW (ref 26.0–32.0)
MCHC: 32.5 % (ref 32.0–36.0)
MCV: 72 um3 — ABNORMAL LOW (ref 79.0–95.0)
Monocytes: 1 % — ABNORMAL LOW (ref 5–12)
Myelocytes: 2 %
Number of Cells Counted: 115
Plt Count: 95 10*3/uL — ABNORMAL LOW (ref 140–370)
Plt Est: DECREASED
RBC: 4.32 10*6/uL (ref 3.90–5.20)
RDW: 17.5 % — ABNORMAL HIGH (ref 12.0–14.0)
Segs: 58 % (ref 34–71)
WBC: 8.7 10*3/uL (ref 4.0–10.0)

## 2011-06-18 LAB — PROTHROMBIN TIME, BLOOD
INR: 2.8
PT,Patient: 30.3 s — ABNORMAL HIGH (ref 9.7–12.5)

## 2011-06-18 LAB — PHOSPHORUS, BLOOD: Phosphorous: 2.5 mg/dL — ABNORMAL LOW (ref 2.7–4.5)

## 2011-06-18 LAB — MAGNESIUM, BLOOD: Magnesium: 2.2 mg/dL (ref 1.7–2.6)

## 2011-06-18 MED ORDER — FUROSEMIDE 40 MG OR TABS
60.0000 mg | ORAL_TABLET | Freq: Two times a day (BID) | ORAL | Status: DC
Start: 2011-06-19 — End: 2011-06-19
  Administered 2011-06-19: 60 mg via ORAL
  Filled 2011-06-18: qty 1

## 2011-06-18 MED ORDER — MICONAZOLE NITRATE 100 MG VA SUPP
200.0000 mg | Freq: Every evening | VAGINAL | Status: AC
Start: 2011-06-18 — End: 2011-06-20
  Administered 2011-06-18 – 2011-06-20 (×3): 200 mg via VAGINAL
  Filled 2011-06-18 (×3): qty 2
  Filled 2011-06-18: qty 3

## 2011-06-18 NOTE — Procedures (Addendum)
FINAL  [Page 1]                       Pulmonary Function Laboratory   PATIENT Information             [name], Director      Last Name:Collison           [address]            [name]           [place], [place] [address]             XB#:[id number]  [id number]#[id number]            Ph: [phone]              PATIENT LOCATION:[facility][id number]                                           Technician:A.[email or web address]/B.[email or web address]                                           REFERRING M.D.: Poch                                                                                      EXERCISE STUDY                                           DATE OF STUDY:06/18/2011 11:30 AM                                              Test Status - Preliminary or   Final?:F           Age:61 Ht:156.00cm  Wt:71kg     Sex:F Purpose:Rest and Exercise                                                          Levels 1 to 6                       Page 1 62f 2 [id number]                      Position           Sitting Stand  ---     ---    ---     ---           % FIO2  RA      RA     RA      ---    ---     ---           Speed (MPH)        Rest    Rest   0.8     ---    ---     ---           Elevation (%)      _       _      _       _      _       _           Duration (min)     10:00   5:00   1:48                                               ARTERIAL BLOOD                                                      GASES           PaO2 (mmHg)                                                                                 PaCO2 (mmHg)                                                                                PH                                                                                          %O2Hb(Co oximeter)                                                                          SpO2               92  92     87                                                 CARDIOVASCULAR                                               Heart Rate (bpm)   92      96     119                                                BP systolic (mmHg)           122  136                                                BP diastolic (mmHg)           50  48                                                 PERCEIVED SYMPTOMS (0 - 10)                                         Fatigue                                2                                             Breathlessness                         4                                                                              [Page 2]  Patient Name Diana Chambers, Diana Chambers:   [id number]                                         Levels 7 to 12                                                     Page 2 of 2 [id number]                      Position           Sitting Stand  ---     Sitting Stand  ---           Nasal Cannula L/M  2.0     2.0    2.0     4.0    4.0     4.0           Speed (MPH)        Rest    Rest   0.6     Rest   Rest    0.8           Elevation (%)      _       _      _       _      _       _           Duration (min)     10:00   5:00   2:00    10:00  5:00    4:00           ARTERIAL BLOOD                                                      GASES           PaO2 (mmHg)        _       _      _       _      _       _           PaCO2 (mmHg)       _       _      _       _      _       _           PH                 _       _      _       _      _       _           %O2Hb(Co oximeter) _       _      _       _      _       _  SpO2               96      95     90      96     96      93           CARDIOVASCULAR                                                      Heart Rate (bpm)   88      93     105     90     96      109           BP systolic (mmHg)           114  128               115  126           BP diastolic (mmHg)           46  50                 50   44           PERCEIVED SYMPTOMS (0 - 10)                                         Fatigue                                1                      2           Breathlessness                         2                      3                      Hgb (gm/dl)            COHb (gm%)                                  Technician Comments:Tech stopped first and second tests            due to low SpO2. Patient completed protocol at her best           walking pace. ABG not ordered.                                   Interpretation:                         While the patient was resting in a seated position breathing   room  air, the pulse oximetry measured the           saturation  at 92 %.              While the patient was resting in a standing position breathing   room  air, the pulse oximetry measured the           saturation at 92 %.              During mild exercise (walking at a rate of 0.8 MPH) while   breathing  room air, the saturation measured by           pulse oximetry decreased to 87 %.              While the patient was resting in a seated position, supplemental  oxygen at a rate of 2 LPM delivered by           nasal cannula maintained the pulse oximetry saturation at 96 %.              While the patient was resting in a standing position,   supplemental  oxygen at a rate of 2 LPM delivered by           nasal cannula maintained the pulse oximetry saturation at 95 %.              While the patient was performing mild exercise (walking at a   rate of  0.8 MPH), supplemental oxygen at a           rate of 4 LPM delivered by nasal cannula maintained the pulse   oximetry  saturation at 93 %.                      [name], MD [facility]                         Shanon Rosser 3]

## 2011-06-19 LAB — CBC WITH DIFF, BLOOD
ANC-Manual Mode: 6.4 10*3/uL (ref 1.6–7.0)
Abs Lymphs: 0.8 10*3/uL (ref 0.8–3.1)
Abs Monos: 0.6 10*3/uL (ref 0.2–0.8)
Bands: 17 % — ABNORMAL HIGH (ref 0–15)
Hct: 32.6 % — ABNORMAL LOW (ref 34.0–45.0)
Hgb: 10.5 gm/dL — ABNORMAL LOW (ref 11.2–15.7)
Lymphocytes: 10 % — ABNORMAL LOW (ref 19–53)
MCH: 23.3 pg — ABNORMAL LOW (ref 26.0–32.0)
MCHC: 32.2 % (ref 32.0–36.0)
MCV: 72.3 um3 — ABNORMAL LOW (ref 79.0–95.0)
Monocytes: 7 % (ref 5–12)
Myelocytes: 2 %
Number of Cells Counted: 114
Plt Count: 112 10*3/uL — ABNORMAL LOW (ref 140–370)
Plt Est: DECREASED
RBC: 4.51 10*6/uL (ref 3.90–5.20)
RDW: 17.7 % — ABNORMAL HIGH (ref 12.0–14.0)
Segs: 64 % (ref 34–71)
WBC: 7.9 10*3/uL (ref 4.0–10.0)

## 2011-06-19 LAB — PROTHROMBIN TIME, BLOOD
INR: 2.3
PT,Patient: 24.6 s — ABNORMAL HIGH (ref 9.7–12.5)

## 2011-06-19 LAB — BASIC METABOLIC PANEL, BLOOD
BUN: 22 mg/dL — ABNORMAL HIGH (ref 6–20)
Bicarbonate: 24 mmol/L (ref 22–29)
Calcium: 10.8 mg/dL — ABNORMAL HIGH (ref 8.6–10.5)
Chloride: 104 mmol/L (ref 98–107)
Creatinine: 0.97 mg/dL — ABNORMAL HIGH (ref 0.51–0.95)
GFR: 58 mL/min
Glucose: 86 mg/dL (ref 70–115)
Potassium: 4.4 mmol/L (ref 3.5–5.1)
Sodium: 135 mmol/L — ABNORMAL LOW (ref 136–145)

## 2011-06-19 LAB — MYCOPLASMA PNEUMONIAE ANTIBODIES, IGG/IGM, BLOOD
M pneumoniae Ab IgG: 0.05 U/L (ref <=0.09)
M pneumoniae Ab IgM: 0.01 U/L (ref <=0.76)

## 2011-06-19 LAB — MAGNESIUM, BLOOD: Magnesium: 2 mg/dL (ref 1.7–2.6)

## 2011-06-19 LAB — PHOSPHORUS, BLOOD: Phosphorous: 2.7 mg/dL (ref 2.7–4.5)

## 2011-06-19 MED ORDER — FUROSEMIDE 40 MG OR TABS
60.0000 mg | ORAL_TABLET | Freq: Three times a day (TID) | ORAL | Status: DC
Start: 2011-06-19 — End: 2011-06-22
  Administered 2011-06-19 – 2011-06-22 (×8): 60 mg via ORAL
  Filled 2011-06-19 (×9): qty 1

## 2011-06-20 LAB — BASIC METABOLIC PANEL, BLOOD
BUN: 25 mg/dL — ABNORMAL HIGH (ref 6–20)
Bicarbonate: 25 mmol/L (ref 22–29)
Calcium: 10.9 mg/dL — ABNORMAL HIGH (ref 8.6–10.5)
Chloride: 104 mmol/L (ref 98–107)
Creatinine: 1.02 mg/dL — ABNORMAL HIGH (ref 0.51–0.95)
GFR: 55 mL/min
Glucose: 87 mg/dL (ref 70–115)
Potassium: 4.7 mmol/L (ref 3.5–5.1)
Sodium: 136 mmol/L (ref 136–145)

## 2011-06-20 LAB — PROTHROMBIN TIME, BLOOD
INR: 2.3
PT,Patient: 24.9 s — ABNORMAL HIGH (ref 9.7–12.5)

## 2011-06-20 LAB — CBC WITH DIFF, BLOOD
ANC-Manual Mode: 5.6 10*3/uL (ref 1.6–7.0)
Abs Lymphs: 1.5 10*3/uL (ref 0.8–3.1)
Abs Monos: 0.3 10*3/uL (ref 0.2–0.8)
Bands: 17 % — ABNORMAL HIGH (ref 0–15)
Hct: 33.3 % — ABNORMAL LOW (ref 34.0–45.0)
Hgb: 10.4 gm/dL — ABNORMAL LOW (ref 11.2–15.7)
Lymphocytes: 19 % (ref 19–53)
MCH: 22.5 pg — ABNORMAL LOW (ref 26.0–32.0)
MCHC: 31.2 % — ABNORMAL LOW (ref 32.0–36.0)
MCV: 72.1 um3 — ABNORMAL LOW (ref 79.0–95.0)
MPV: 10.1 fL (ref 9.4–12.4)
Metamyelocytes: 3 %
Monocytes: 4 % — ABNORMAL LOW (ref 5–12)
Myelocytes: 1 %
Number of Cells Counted: 117
Plt Count: 107 10*3/uL — ABNORMAL LOW (ref 140–370)
Plt Est: DECREASED
RBC: 4.62 10*6/uL (ref 3.90–5.20)
RDW: 17.6 % — ABNORMAL HIGH (ref 12.0–14.0)
Segs: 56 % (ref 34–71)
WBC: 7.7 10*3/uL (ref 4.0–10.0)

## 2011-06-20 LAB — PHOSPHORUS, BLOOD: Phosphorous: 3.4 mg/dL (ref 2.7–4.5)

## 2011-06-21 LAB — CBC WITH DIFF, BLOOD
ANC-Manual Mode: 5.5 10*3/uL (ref 1.6–7.0)
Abs Eosinophils: 0.2 10*3/uL (ref 0.0–0.5)
Abs Lymphs: 1.2 10*3/uL (ref 0.8–3.1)
Abs Monos: 0.3 10*3/uL (ref 0.2–0.8)
Absolute Nucleated RBC: 0 10*3/uL (ref 0.0–0.9)
Bands: 16 % — ABNORMAL HIGH (ref 0–15)
Eosinophils: 2 % (ref 1–7)
Hct: 34.8 % (ref 34.0–45.0)
Hgb: 11.1 gm/dL — ABNORMAL LOW (ref 11.2–15.7)
Lymphocytes: 16 % — ABNORMAL LOW (ref 19–53)
MCH: 23 pg — ABNORMAL LOW (ref 26.0–32.0)
MCHC: 31.9 % — ABNORMAL LOW (ref 32.0–36.0)
MCV: 72 um3 — ABNORMAL LOW (ref 79.0–95.0)
Metamyelocytes: 2 %
Monocytes: 4 % — ABNORMAL LOW (ref 5–12)
Myelocytes: 4 %
NRBC: 0 /100 WBC (ref 0–1)
Number of Cells Counted: 115
Plt Count: 119 10*3/uL — ABNORMAL LOW (ref 140–370)
Plt Est: DECREASED
RBC: 4.83 10*6/uL (ref 3.90–5.20)
RDW: 17.5 % — ABNORMAL HIGH (ref 12.0–14.0)
Segs: 56 % (ref 34–71)
WBC: 7.7 10*3/uL (ref 4.0–10.0)

## 2011-06-21 LAB — BASIC METABOLIC PANEL, BLOOD
BUN: 29 mg/dL — ABNORMAL HIGH (ref 6–20)
Bicarbonate: 26 mmol/L (ref 22–29)
Calcium: 10.9 mg/dL — ABNORMAL HIGH (ref 8.6–10.5)
Chloride: 102 mmol/L (ref 98–107)
Creatinine: 1 mg/dL — ABNORMAL HIGH (ref 0.51–0.95)
GFR: 56 mL/min
Glucose: 88 mg/dL (ref 70–115)
Potassium: 4.4 mmol/L (ref 3.5–5.1)
Sodium: 136 mmol/L (ref 136–145)

## 2011-06-21 LAB — PROTHROMBIN TIME, BLOOD
INR: 2.3
PT,Patient: 24.8 s — ABNORMAL HIGH (ref 9.7–12.5)

## 2011-06-21 LAB — PHOSPHORUS, BLOOD: Phosphorous: 3.5 mg/dL (ref 2.7–4.5)

## 2011-06-22 LAB — BASIC METABOLIC PANEL, BLOOD
BUN: 32 mg/dL — ABNORMAL HIGH (ref 6–20)
Bicarbonate: 27 mmol/L (ref 22–29)
Calcium: 10.9 mg/dL — ABNORMAL HIGH (ref 8.6–10.5)
Chloride: 102 mmol/L (ref 98–107)
Creatinine: 1.02 mg/dL — ABNORMAL HIGH (ref 0.51–0.95)
GFR: 55 mL/min
Glucose: 90 mg/dL (ref 70–115)
Potassium: 4.3 mmol/L (ref 3.5–5.1)
Sodium: 134 mmol/L — ABNORMAL LOW (ref 136–145)

## 2011-06-22 LAB — CBC WITH DIFF, BLOOD
ANC-Manual Mode: 5.1 10*3/uL (ref 1.6–7.0)
Abs Lymphs: 1.6 10*3/uL (ref 0.8–3.1)
Abs Monos: 0.2 10*3/uL (ref 0.2–0.8)
Absolute Nucleated RBC: 0 10*3/uL (ref 0.0–0.9)
Bands: 17 % — ABNORMAL HIGH (ref 0–15)
Hct: 35.1 % (ref 34.0–45.0)
Hgb: 11.2 gm/dL (ref 11.2–15.7)
Lymphocytes: 21 % (ref 19–53)
MCH: 23.1 pg — ABNORMAL LOW (ref 26.0–32.0)
MCHC: 31.9 % — ABNORMAL LOW (ref 32.0–36.0)
MCV: 72.4 um3 — ABNORMAL LOW (ref 79.0–95.0)
Metamyelocytes: 4 %
Monocytes: 3 % — ABNORMAL LOW (ref 5–12)
Myelocytes: 5 %
NRBC: 0 /100 WBC (ref 0–1)
Number of Cells Counted: 115
Plt Count: 127 10*3/uL — ABNORMAL LOW (ref 140–370)
Plt Est: DECREASED
RBC: 4.85 10*6/uL (ref 3.90–5.20)
RDW: 17.7 % — ABNORMAL HIGH (ref 12.0–14.0)
Segs: 50 % (ref 34–71)
WBC: 7.6 10*3/uL (ref 4.0–10.0)

## 2011-06-22 LAB — PROTHROMBIN TIME, BLOOD
INR: 2
PT,Patient: 21.6 s — ABNORMAL HIGH (ref 9.7–12.5)

## 2011-06-22 MED ORDER — FUROSEMIDE 40 MG OR TABS
60.0000 mg | ORAL_TABLET | Freq: Two times a day (BID) | ORAL | Status: DC
Start: 2011-06-22 — End: 2011-06-24
  Administered 2011-06-22 – 2011-06-24 (×4): 60 mg via ORAL
  Filled 2011-06-22 (×4): qty 1

## 2011-06-22 NOTE — Plan of Care (Signed)
Problem: Tissue Perfusion - Cardiopulmonary, Altered  Goal: Hemodynamic stability  Outcome: Met  Blood pressure 107/51, pulse 90, temperature 98.3 F (36.8 C), resp. rate 19, height 5\' 2"  (1.575 m), weight 69.264 kg (152 lb 11.2 oz), SpO2 98.00%. On 2 L NC and at times RA   pt on cont tele monitoring, pt awake alert and oriented x 3 and tolerated activities with minimal assisstance, no chest pain or pressure , on tele SR with hr 90's, afebrile, pt able to move all ext ,pt kept comfortable .pt aware of ongoing treatment pt able to move all ext .pt aware of ongoing treatment ,tolerated activities, no chest pain or pressure, goals are still in progress.     Problem: Breathing Pattern - Ineffective  Goal: Respiratory rate, rhythm and depth return to baseline  Outcome: Met  Pt awake alert and oriented x 3. Assess respiratory status Q4h and prn.  Pt is currently on  2L NC   with oxygen saturation of    98%      Respiratory rate is even and non-labored.  Patient denies cough and shortness of breath at this time pt tolerated simple activities and getting oob to the chair, ambulating in the hallway and tolerated well, able to move all ext without any difficulty.cont monitoring at this time.IS kept encouraging patient, pt follows command , no issues address and goals are still in progress. No respiratory distress noted.  Will continue to monitor and to update as needed.     Problem: Discharge Planning  Goal: Participation in care planning  Outcome: Met  discuss with pt regarding importance of poc including goals of staying in the hospital and plans for discharge  pt awake alert and and oriented x 3 and able to verbalize needs for assisstance, pt aware of plans for treatment inpatient, , pt participate in all care and verbalize understanding of plans and able to call for help as needed. pt resting at this time, kept clean dry and intact, no issues address. participate in all care.pt able to do self care with minimal assisst.  goals are still in progress.pt cont treatment /regimen tolerated without any adverse reaction, pt and husband aware of procedure for heart cath on Monday.    Problem: Infection  Goal: Absence of infection signs and symptoms  Outcome: Met  Pt has remaine afebrile, no infection noted , cont monitoring at this time     Problem: Pain - Acute  Goal: Communication of presence of pain  Outcome: Met  discuss with pt regarding importance of pain meds and management   patient able to verbalize needs for assisstance, no issues address, pt has no c/o pain , cont assessment, pt resting at this time and pt state will verbalize needs for pain meds as needed.goals are still in progress and cont monitoring , pt awake alert and oriented x 3 and no respiratory distress, vs stable , goals are still in progress, see e mar for pain med/level and f/u .

## 2011-06-22 NOTE — Plan of Care (Signed)
Problem: Tissue Perfusion - Cardiopulmonary, Altered  Goal: Hemodynamic stability  Outcome: Met  Pt awake and oriented x4. Skin dry and warm to touch. No edema noted. Venodynes on BLE while in bed. Pt on tele w/ a heart rhythm of NSR, HR 96, BP 123/53 at start of shift. Pt denies chest pain/pressure/palpitation. Instructed pt to call staff if c/o chest pain/pressure/palpitation, strict I/O's, daily wt and labs in am. Pt verbalized understanding to instructions. Call bell within reach of pt. Will cont' to monitor and assess. Results for Diana Chambers, Diana Chambers (MRN [id number]) as of 06/22/2011 22:41    Ref. Range 06/22/2011 05:54   Sodium Latest Range: 136-145 mmol/L 134 (L)   Potassium Latest Range: 3.5-5.1 mmol/L 4.3   Chloride Latest Range: 98-107 mmol/L 102   Bicarbonate Latest Range: 22-29 mmol/L 27   BUN Latest Range: 6-20 mg/dL 32 (H)   Creatinine Latest Range: 0.51-0.95 mg/dL 2.95 (H)   GFR No range found 55   Glucose Latest Range: 70-115 mg/dL 90   Calcium Latest Range: 8.6-10.5 mg/dL 28.4 (H)         Problem: Breathing Pattern - Ineffective  Goal: Respiratory rate, rhythm and depth return to baseline  Outcome: Not Met  Fain crackles to RLL. Pt on O2 at 2 lpm via nc and satting 97% at start of shift. Pt sob on exertion. No respiratory distress noted. Pt w/ productive cough; secretions yellow, thin, and small amount. Instructed pt to call staff if c/o distress. Pt verbalized understanding to instructions. Pt on Flolan therapy w/ concentration of 45,000 ng/ml, 29 ng/kg/min, 80 ml/24 hrs via left subclavian. Dressing d/i and changed today per pt. Gauze and opsite. No redness. No drainage. Ice packs change ordered. See MAR. No alarms noted. Back-up CADD at bedside. No c/o n/v/. No c/o jaw pain. Will cont' to monitor and assess. Labs in am. Call bell within reach of pt.     Problem: Infection  Goal: Absence of infection signs and symptoms  Outcome: Not Met  Pt remains afebrile. Results for [name], [name] (MRN  [id number]) as of 06/22/2011 22:41    Ref. Range 06/22/2011 05:54   WBC Latest Range: 4.0-10.0 1000/mm3 7.6     Will cont' to monitor and assess. Labs in am. Pt cont' w/ productive cough; yellow secretions, thin and small amount.    Problem: Pain - Acute  Goal: Communication of presence of pain  Outcome: Met  Pt denies any pain. No moaning or groaning noted. No facial grimaces. Instructed pt to call staff if c/o pain. Pt verbalized understanding to instructions. Call bell within reach of pt. Will cont' to monitor and assess.

## 2011-06-22 NOTE — Interdisciplinary (Signed)
Received pt from outgoing nurse Christina RN at 1500 today .

## 2011-06-22 NOTE — Progress Notes (Signed)
Pulmonary Hypertension Attending:  I have seen and examined Diana Chambers with Dr. Gilberto Better on rounds.  This is a 61 year old female with pulmonary hypertension, admitted with Moraxella pneumonia.  Anticipating right heart catheterization tomorrow.  Feeling well, edema resolving in her legs.    Tmax: 98.6  I/O: -448 mL  BP 120/54  Pulse 89  Temp(Src) 98.6 F (37 C)  Resp 20  Ht 5\' 2"  (1.575 m)  Wt 69.264 kg (152 lb 11.2 oz)  BMI 27.92 kg/m2  SpO2 100%  Exam notable for a pleasant, talkative female, accompanied by her husband.  Tunneled catheter present in the left anterior chest.  Posrterior dependent crackles bilaterally, slightly more prominent on the left.  Clear towards the apices.  No edema in the legs.  Flushed, red skin diffusely.    Lab Results   Component Value Date    WBC 7.6 06/22/2011    RBC 4.85 06/22/2011    HGB 11.2 06/22/2011    HCT 35.1 06/22/2011    MCV 72.4 06/22/2011    MCHC 31.9 06/22/2011    RDW 17.7 06/22/2011    PLT 127 06/22/2011    PLT 179 09/12/2008    MPV 10.1 06/20/2011     Lab Results   Component Value Date    NA 134 06/22/2011    K 4.3 06/22/2011    CL 102 06/22/2011    BICARB 27 06/22/2011    BUN 32 06/22/2011    CREAT 1.02 06/22/2011    GLU 90 06/22/2011    [place] 10.9 06/22/2011    ALK 87 06/06/2011    AST 12 06/06/2011    ALT 8 06/06/2011    TP 8.3 06/06/2011    ALB 3.7 06/06/2011    TBILI 0.4 06/06/2011     Lab Results   Component Value Date    INR 2.0 06/22/2011    PTT 38.2 06/16/2011       No recent imaging.     - Moraxella pneumonia.  Continues on azithromycin, today is day 7.  Will stop treatment today.   - Pulmonary hypertension.  Continue epoprostenol at 29 ng/kg/min, as well as digoxin.  Check digoxin level tomorrow.   - Continue diuresis with furosemide, will back off a bit to b.i.d. dosing due to fluid even status today.   - Holding warfarin in anticipation of RHC tomorrow.  NPO after midnight tonight.    Ellen Henri, M.D.  Pulmonary and Critical Care  Pager: [phone]  PID: [id number]

## 2011-06-23 LAB — CBC WITH DIFF, BLOOD
ANC-Manual Mode: 4.9 10*3/uL (ref 1.6–7.0)
Abs Eosinophils: 0.1 10*3/uL (ref 0.0–0.5)
Abs Lymphs: 0.8 10*3/uL (ref 0.8–3.1)
Abs Monos: 0.3 10*3/uL (ref 0.2–0.8)
Bands: 4 % (ref 0–15)
Eosinophils: 1 % (ref 1–7)
Hct: 33.8 % — ABNORMAL LOW (ref 34.0–45.0)
Hgb: 11 gm/dL — ABNORMAL LOW (ref 11.2–15.7)
Lymphocytes: 13 % — ABNORMAL LOW (ref 19–53)
MCH: 23.6 pg — ABNORMAL LOW (ref 26.0–32.0)
MCHC: 32.5 % (ref 32.0–36.0)
MCV: 72.4 um3 — ABNORMAL LOW (ref 79.0–95.0)
MPV: 9.3 fL — ABNORMAL LOW (ref 9.4–12.4)
Metamyelocytes: 1 %
Monocytes: 4 % — ABNORMAL LOW (ref 5–12)
Myelocytes: 3 %
Number of Cells Counted: 113
Other Cells: 1 %
Plt Count: 111 10*3/uL — ABNORMAL LOW (ref 140–370)
Plt Est: DECREASED
RBC: 4.67 10*6/uL (ref 3.90–5.20)
RDW: 17.8 % — ABNORMAL HIGH (ref 12.0–14.0)
Segs: 73 % — ABNORMAL HIGH (ref 34–71)
WBC: 6.3 10*3/uL (ref 4.0–10.0)

## 2011-06-23 LAB — BASIC METABOLIC PANEL, BLOOD
BUN: 31 mg/dL — ABNORMAL HIGH (ref 6–20)
Bicarbonate: 28 mmol/L (ref 22–29)
Calcium: 10.7 mg/dL — ABNORMAL HIGH (ref 8.6–10.5)
Chloride: 101 mmol/L (ref 98–107)
Creatinine: 1.06 mg/dL — ABNORMAL HIGH (ref 0.51–0.95)
GFR: 53 mL/min
Glucose: 92 mg/dL (ref 70–115)
Potassium: 4.3 mmol/L (ref 3.5–5.1)
Sodium: 138 mmol/L (ref 136–145)

## 2011-06-23 LAB — PTH INTACT, BLOOD: PTH Intact: 69 pg/mL — ABNORMAL HIGH (ref 15–65)

## 2011-06-23 LAB — DIGOXIN, BLOOD: Digoxin: 2.1 ng/mL (ref 0.9–2.5)

## 2011-06-23 LAB — PROTHROMBIN TIME, BLOOD
INR: 1.8
PT,Patient: 18.7 s — ABNORMAL HIGH (ref 9.7–12.5)

## 2011-06-23 NOTE — Progress Notes (Signed)
PCCM Fellow Note    Hospital Day:   8 days - Admitted on: 06/15/2011     SUBJECTIVE/OVERNIGHT EVENTS    Continues to feel better. No new complaints    OBJECTIVE:   Yesterday's intake and output:  06/09 0600 - 06/10 0559  In: 2400 [P.O.:2400]  Out: 3451 [Urine:3450]      Physical Exam:  Temperature:  [98.2 F (36.8 C)-98.5 F (36.9 C)] 98.2 F (36.8 C) (06/10 1111)  Blood pressure (BP): (107-138)/(51-67) 116/61 mmHg (06/10 1111)  Heart Rate:  [90-96] 91  (06/10 1111)  Respirations:  [18-20] 20  (06/10 1111)  Pain Score:  [-] 0 (06/10 1111)  O2 Device:  [-] Nasal cannula (06/10 1111)  O2 Flow Rate (L/min):  [2 l/min] 2 l/min (06/10 1111)  SpO2:  [93 %-98 %] 93 % (06/10 1111)    General:  In no resp distress   HEENT:  anicteric  Neck:  Supple   Lungs:  Crackles at bases bilat   CV:  RRR TR murmur   Abdomen:  Soft + BS  Extremities:  trace edema    Current Inpatient Medications:       . azithromycin  500 mg Daily   . cholecalciferol  2,000 Units Daily   . digoxin  250 mcg Daily   . folic acid  0.5 mg Daily   . furosemide  60 mg BID   . medroxyPROGESTERone  1.25 mg Daily   . potassium chloride  20 mEq TID   . Prostacyclin Cassette Change   Daily   . Prostacyclin Ice Pack   Q6H   . Prostacyclin Tubing   Three Times Weekly   . Prostacylin Batteries   Weekly   . sildenafil  80 mg TID   . sodium chloride (PF)  3 mL Q8H   . spironolactone  50 mg BID       Labs:   Lab Results   Component Value Date    WBC 6.3 06/23/2011    HGB 11.0* 06/23/2011    HCT 33.8* 06/23/2011    PLT 111* 06/23/2011    BAND 4 06/23/2011    NA 138 06/23/2011    K 4.3 06/23/2011    CL 101 06/23/2011    BICARB 28 06/23/2011    BUN 31* 06/23/2011    CREAT 1.06* 06/23/2011    GLU 92 06/23/2011    [place] 10.7* 06/23/2011    INR 1.8 06/23/2011     Rest and exercise testing today: O2 sat 93--> 88% on RA. 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 Significantly improved. RHC tomorrow AM     ~ ID moraxella tracheobronchitis/pneumonia likely complicating a viral URI   - DC Azithro (took a total of 9 days of therapy which is adequate)  - viral dfa negative/legionella negative ; mycoplasma negative     ~ Hypoxemia : significantly improved. However still borderline. Will likely wait another day and reassess. She lives 8 hours away and will be driving back     ~PAH:   Continue PO lasix 60 BID, BUN/Creat slightly up since admission   Continue KCL 20 TID  continue epoprostenol at 29 ng/kg/min, sildenafil 80mg  tid/digoxin    ~ Anticoagulation : continue to hold warfarin     ~  Mildly Elevated [place] (10.7) with inappropriately elevated PTH (69) consistent with primary hyperpara- will need outpatient w/u and  treatment     DVT prophy: ambulating, not indicated anymore    Low Na Diet    Patient seen and evaluated with Attending Physician Dr. Paulla Fore, plan formulated accordingly.    Aris Georgia, M.D.   Pulmonary and Critical Care Fellow  Pager: [phone]   PID: [id number]  Pulmonary Vascular Attending Addendum:   Patient was examined with house staff.   Labs, imaging and tests reviewed.   Please see above note for full details and plan.     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 CXRAY with increased interstitial markings bilaterally. Sputum Cx growing moraxella. Completed 7 days of azithro.  Feeling much better  -Lasix 60 mg BID   -Continue PAH medications including sildenafil and flolan   -Repeat rest and exercise oximetry  -Repeat RHC tomorrow AM  -Following repeat RHC pt can be discharged    Bluford Main MD   Pager [phone]

## 2011-06-23 NOTE — Procedures (Signed)
 PERFORMED ON - 06/23/2011 12:27:11;   DONE BY - RUELAS, JESSICA;  PROCEDURE - PDP RE-EVALUATION;   MD REQUEST #1: O2 PROTOCOL;   RCP RECOMMENDATION: #1: O2 PROTOCOL;   INDICATION: #1: O: TO KEEP SA02 >92;   EVALUATION OUTCOME: #1: AS RCP RECOMMENDED;   ADVERSE REACTIONS: NONE;   COMMENT: NO CHANGES IN PROTOCOL ORDERS  ELECTRONIC SIGNATURE DERIVED FROM A SINGLE CONTROLLED ACCESS PASSWORD:   Franne Grip ; 06/23/2011 12:27:11

## 2011-06-23 NOTE — Plan of Care (Addendum)
Problem: Tissue Perfusion - Cardiopulmonary, Altered  Goal: Hemodynamic stability  Outcome: Met  Assess heart sounds, lung sounds, and for presence of edema. Monitor for CP/pressure and instruct pt to call if CP/pressure occur. Tele monitoring. Monitor and record vital signs, O2 sats, I&O's, labs and weights as ordered and report abnormal results to MD.  Pt denies chest pain/pressure/palp. Pt in sinus rhythm with HR in the 80's-90's.Pt's vital signs are stable. Pt on Flolan set at 45,000 ng/ml, 29 ng/kg/min, 80 ml/24 hrs.Will continue to monitor and will document any changes.         Problem: Breathing Pattern - Ineffective  Goal: Respiratory rate, rhythm and depth return to baseline  Outcome: Met  Assess lung sounds q shift and prn. Monitor and record O2 sats. Instruct pt to notify RN with c/o SOB or difficulty breathing. Encourage pt deep breathing and coughing exercises.   Pt is sob with exertion, not in respiratory distress. Pt on 2 LPM/NC satting 96%. Pt's lungs sounds are clear/ diminished at bilateral mid and lower bases. Will continue to monitor and will document any changes.        Problem: Discharge Planning  Goal: Participation in care planning  Outcome: Met  No discharge order yet at this time. Instructed pt that she will be NPO after midnight in preparation for her am procedure. Informed pt of her medications prior to administration. Pt and family has no further questions at this time. Will continue to monitor and will document any changes.    Problem: Infection  Goal: Absence of infection signs and symptoms  Outcome: Met  Pt is alert and oriented x 4 . Pt is afebrile. Pt's vital signs are stable. Will continue to monitor and will document any changes.    Problem: Pain - Acute  Goal: Communication of presence of pain  Outcome: Met  Assess pain level using 0-10 pain scale Q4hrs and prn. Administer pain meds as needed. Encourage pt to reposition for comfort. Monitor RR and LOC. Instruct pt to notify nurse  if she is in pain or in any kind of discomfort.   Pt denies pain and discomfort at the moment. Will continue to monitor and will document any changes.

## 2011-06-23 NOTE — Interdisciplinary (Signed)
Husband purchased [employer] pump, will arrive to the hospital  on Tuesday, 06/24/11. They also want dc orders to be faxed to [employer], fax #[phone] Att. Tobi Bastos. Husband wants to have Medicare pay for one pump, so they'll have two of them.

## 2011-06-23 NOTE — Procedures (Addendum)
 FINAL  [Page 1]                       Pulmonary Function Laboratory   PATIENT Information             [name], Director      Last Name:Minter           [address]            [name]           [place], [place] [address]             HK#:[id number]  OV#[id number]            Ph: [phone]              PATIENT LOCATION:[id number]                                           Technician:A.Colette Ribas                                           REFERRING M.D.: [facility]                                                                                      EXERCISE STUDY                                           DATE OF STUDY:06/23/2011 1:30 PM                                              Test Status - Preliminary or   Final?:F           Age:60 Ht:156.00cm  Wt:69kg     Sex:F Purpose:Rest and Exercise                            Page 1 66f 1 [id number]                      Position           Sitting Stand  ---     Sitting Stand  ---           Nasal Cannula L/M  RA      RA     RA      2.0    2.0     2.0           Speed (MPH)        Rest    Rest   0.8     Rest   Rest    0.8  Elevation (%)                                                                             Duration (min)     10:00   5:00   4:00    10:00  5:00    4:00           ARTERIAL BLOOD                                                      GASES           PaO2 (mmHg)                                                                               PaCO2 (mmHg)                                                                              PH                                                                                        %O2Hb(Co oximeter)                                                                        SpO2               93      92     88      96     96      92           CARDIOVASCULAR  Heart Rate (bpm)   91      97     112     95     98      108           BP  systolic (mmHg)           99   118               100  100           BP diastolic (mmHg)           48  40                40   46           PERCEIVED SYMPTOMS (0 - 10)                                         Fatigue                                3                      3           Breathlessness                         2                      3                                 Hgb (gm/dl)            COHb (gm%)                                  Technician Comments:Patient completed protocol at her best           walking pace. ABG was not ordered.                                                                                             [Page 2]          Vicki, [place]: [phone]                      Interpretation:                         While the patient was resting in a seated position breathing   room  air, the pulse oximetry measured the           saturation at 93 %.              While the patient was resting in a  standing position breathing   room  air, the pulse oximetry measured the           saturation at 92 %.              During mild exercise (walking at a rate of 0.8 MPH) while   breathing  room air, the saturation measured by           pulse oximetry decreased to 88 %.              While the patient was resting in a seated position, supplemental  oxygen at a rate of 2 LPM delivered by           nasal cannula maintained the pulse oximetry saturation at 96 %.              While the patient was resting in a standing position,   supplemental  oxygen at a rate of 2 LPM delivered by           nasal cannula maintained the pulse oximetry saturation at 96 %.              While the patient was performing mild exercise (walking at a   rate of  0.8 MPH), supplemental oxygen at a           rate of 2 LPM delivered by nasal cannula maintained the pulse   oximetry  saturation at 92 %.                      [name], MD [facility]

## 2011-06-23 NOTE — Plan of Care (Signed)
Problem: Tissue Perfusion - Cardiopulmonary, Altered  Goal: Hemodynamic stability  Outcome: Met  Vital signs remain stable. Blood pressure 120/53, pulse 87, temperature 98.4 F (36.9 C), resp. rate 16, height 5\' 2"  (1.575 m), weight 68.629 kg (151 lb 4.8 oz), SpO2 98.00%. Patient NSR on tele.  Patient denies any chest pain/discomfort or shortness of breath.  Will continue to monitor and inform MD of changes in condition/status.       Goal: Early recognition of deterioration  Outcome: Met  No s/s of deterioration.    Problem: Breathing Pattern - Ineffective  Goal: Respiratory rate, rhythm and depth return to baseline  Outcome: Met  Patient with no s/s of respiratory distress.  Vital signs stable. Blood pressure 120/53, pulse 87, temperature 98.4 F (36.9 C), resp. rate 16, height 5\' 2"  (1.575 m), weight 68.629 kg (151 lb 4.8 oz), SpO2 98.00%. No complaints of shortness of breath & no use of accessory muscles noted.  Flolan running via left scv central line with no change in rate.  Will continue to monitor.      Goal: Minimal or absent use of accessory muscles  Outcome: Met  See note above.    Problem: Discharge Planning  Goal: Participation in care planning  Outcome: Met  Patient able to participate in care and discharge planning.  Patient able to communicate needs.  Pt aware of plan for right heart cath in AM and possible discharge tomorrow.  Pt's husband has arranged for oxygen to arrive to hospital tomorrow.  Will continue to encourage patient to participate & communicate.     Problem: Infection  Goal: Absence of infection signs and symptoms  Outcome: Met  No signs or symptoms of infection noted.  Patient remains afebrile Blood pressure 120/53, pulse 87, temperature 98.4 F (36.9 C), resp. rate 16, height 5\' 2"  (1.575 m), weight 68.629 kg (151 lb 4.8 oz), SpO2 98.00%.   Lab Results   Component Value Date     WBC 6.3 06/23/2011     RBC 4.67 06/23/2011     HGB 11.0 06/23/2011     HCT 33.8 06/23/2011     MCV 72.4  06/23/2011     MCHC 32.5 06/23/2011     RDW 17.8 06/23/2011     PLT 111 06/23/2011     PLT 179 09/12/2008     MPV 9.3 06/23/2011     PO antibx discontinued today.  Pt aware. Will continue to monitor and inform MD of any signs/symptoms of infection.        Problem: Pain - Acute  Goal: Communication of presence of pain  Outcome: Met  Patient able to communicate presence of pain with use of verbal pain scoring.  Pt has denied pain throughout day.

## 2011-06-24 LAB — CBC WITH DIFF, BLOOD
ANC-Manual Mode: 4.1 10*3/uL (ref 1.6–7.0)
Abs Eosinophils: 0.1 10*3/uL (ref 0.0–0.5)
Abs Lymphs: 1.2 10*3/uL (ref 0.8–3.1)
Abs Monos: 0.2 10*3/uL (ref 0.2–0.8)
Bands: 12 % (ref 0–15)
Eosinophils: 1 % (ref 1–7)
Hct: 33.4 % — ABNORMAL LOW (ref 34.0–45.0)
Hgb: 10.5 gm/dL — ABNORMAL LOW (ref 11.2–15.7)
Lymphocytes: 21 % (ref 19–53)
MCH: 22.8 pg — ABNORMAL LOW (ref 26.0–32.0)
MCHC: 31.4 % — ABNORMAL LOW (ref 32.0–36.0)
MCV: 72.6 um3 — ABNORMAL LOW (ref 79.0–95.0)
Metamyelocytes: 3 %
Monocytes: 3 % — ABNORMAL LOW (ref 5–12)
Myelocytes: 3 %
Number of Cells Counted: 112
Plt Count: 106 10*3/uL — ABNORMAL LOW (ref 140–370)
Plt Est: DECREASED
RBC: 4.6 10*6/uL (ref 3.90–5.20)
RDW: 17.8 % — ABNORMAL HIGH (ref 12.0–14.0)
Segs: 57 % (ref 34–71)
WBC: 5.9 10*3/uL (ref 4.0–10.0)

## 2011-06-24 LAB — BASIC METABOLIC PANEL, BLOOD
BUN: 34 mg/dL — ABNORMAL HIGH (ref 6–20)
Bicarbonate: 27 mmol/L (ref 22–29)
Calcium: 10.7 mg/dL — ABNORMAL HIGH (ref 8.6–10.5)
Chloride: 102 mmol/L (ref 98–107)
Creatinine: 1.09 mg/dL — ABNORMAL HIGH (ref 0.51–0.95)
GFR: 51 mL/min
Glucose: 91 mg/dL (ref 70–115)
Potassium: 4.1 mmol/L (ref 3.5–5.1)
Sodium: 136 mmol/L (ref 136–145)

## 2011-06-24 LAB — PROTHROMBIN TIME, BLOOD
INR: 1.5
PT,Patient: 16 s — ABNORMAL HIGH (ref 9.7–12.5)

## 2011-06-24 MED ORDER — DIGOXIN 0.125 MG OR TABS
125.0000 ug | ORAL_TABLET | Freq: Every day | ORAL | Status: DC
Start: 2011-06-24 — End: 2011-10-08

## 2011-06-24 MED ORDER — ESTRADIOL 0.25 MG/0.25GM TD GEL
0.25 mg | Freq: Every evening | TRANSDERMAL | Status: DC
Start: ? — End: 2017-07-15

## 2011-06-24 MED ORDER — CHOLECALCIFEROL 2000 UNIT OR CAPS: 1.00 | ORAL_CAPSULE | Freq: Every day | ORAL | Status: AC

## 2011-06-24 MED ORDER — ESTRADIOL 2 MG VA RING: 1.00 | VAGINAL_RING | VAGINAL | Status: AC

## 2011-06-24 MED ORDER — FUROSEMIDE 20 MG OR TABS
60.0000 mg | ORAL_TABLET | Freq: Two times a day (BID) | ORAL | Status: DC
Start: 2011-06-24 — End: 2011-06-24

## 2011-06-24 MED ORDER — FUROSEMIDE 20 MG OR TABS
60.0000 mg | ORAL_TABLET | Freq: Two times a day (BID) | ORAL | Status: DC
Start: 2011-06-24 — End: 2016-05-16

## 2011-06-24 NOTE — Progress Notes (Signed)
PCCM Fellow Note    Hospital Day:   9 days - Admitted on: 06/15/2011     SUBJECTIVE/OVERNIGHT EVENTS  No issues . Feels okay . No pain     OBJECTIVE:   Yesterday's intake and output:  06/10 0600 - 06/11 0559  In: 780 [P.O.:780]  Out: 2250 [Urine:2250]      Physical Exam:  Temperature:  [98.1 F (36.7 C)-98.4 F (36.9 C)] 98.2 F (36.8 C) (06/11 1142)  Blood pressure (BP): (114-130)/(53-65) 129/60 mmHg (06/11 1142)  Heart Rate:  [83-97] 85  (06/11 1142)  Respirations:  [16-20] 17  (06/11 1142)  Pain Score:  [-] 0 (06/11 1142)  O2 Device:  [-] Nasal cannula (06/11 1142)  O2 Flow Rate (L/min):  [2 l/min] 2 l/min (06/11 1142)  SpO2:  [94 %-98 %] 95 % (06/11 1142)       Labs:   Lab Results   Component Value Date    WBC 5.9 06/24/2011    HGB 10.5* 06/24/2011    HCT 33.4* 06/24/2011    PLT 106* 06/24/2011    BAND 12 06/24/2011    NA 136 06/24/2011    K 4.1 06/24/2011    CL 102 06/24/2011    BICARB 27 06/24/2011    BUN 34* 06/24/2011    CREAT 1.09* 06/24/2011    GLU 91 06/24/2011    [place] 10.7* 06/24/2011    INR 1.5 06/24/2011     Rest and exercise testing today: O2 sat 93--> 88% on RA. 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 Significantly improved.      Patient had right heart catheterization today. Procedure was well tolerated. Hemodynamic numbers were not change compared to 2 years ago.  There was no evidence of post operative complications including hematoma or pseudo-aneurysm  She was discharged home she will followup with her primary care physician for her elevated calcium level.  She will followup with Dr. Paulla Fore in 3 months with repeat exercise testing/6 minute walk test  She was discharged discharged on oxygen 2 L during exertion and at night  No further need of antibiotics  Continue PO lasix 60 BID  Continue KCL 20  TID  continue epoprostenol at 29 ng/kg/min, sildenafil 80mg  tid  Digoxin dose changed to 0.125 daily  Coumadin will be resumed as an outpatient    Patient seen and evaluated with Attending Physician Dr. Paulla Fore, plan formulated accordingly.    Aris Georgia, M.D.   Pulmonary and Critical Care Fellow  Pager: [phone]   PID: [id number]     Pulmonary Critical Care Attending Addendum:  Patient was examined with house staff.  Labs, imaging and tests reviewed.  Please see above note for full details and plan.      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 CXRAY with increased interstitial markings bilaterally. Sputum Cx growing moraxella. Completed 7 days of azithro. Feeling much better   -RHC today with stable hemodynamics compared to prior RHC  -Lasix 60 mg BID   -Continue PAH medications including sildenafil and flolan   -Repeat rest and exercise oximetry revealed requirements for 2 LPM with exercise  -Recommend 2 LPM at night as well  -RTC in 3 months with  Bluford Main MD  Pager [phone]

## 2011-06-24 NOTE — Interdisciplinary (Signed)
Pharmacist Admission Medication Reconciliation    Diana Chambers    Date performed: 06/24/2011    Admit medication reconciliation performed by pharmacist.  Any discrepancies identified discussed with physician and reflected in EPIC.    Karen Kays, PharmD  PGY-2 Transitions of Care resident  Pager: [phone]

## 2011-06-24 NOTE — Op Note (Signed)
Dictating Practitioner: Kermit Balo. Paulla Fore, M.D.     Staff Physician:  Kermit Balo. Paulla Fore, M.D.    Date of Operation:  06/24/2011        PREOPERATIVE DIAGNOSIS: Pulmonary arterial hypertension.    POSTOPERATIVE DIAGNOSIS: Pulmonary arterial hypertension.    PROCEDURE PERFORMED: Diagnostic right heart catheterization.    SURGEON/STAFF: Bluford Main, MD    ANESTHESIA: 10 mL of 2% lidocaine injected locally.    COMPLICATIONS: None.    ESTIMATED BLOOD LOSS: Minimal.    PROCEDURE DESCRIPTION: Informed consent was obtained prior to procedure. A  time-out was performed prior to procedure to confirm patient name,  procedure type and procedure location. The patient was prepped and draped  in the usual fashion exposing the right groin. The right femoral vein was  identified with ultrasound. The skin above right femoral vein was  anesthetized with 10 mL of 2% lidocaine. Following numbing of the skin, a  7-French Precision sheath was used to access the right femoral vein with 1  stick without arterial puncture. Position in the venous system was  confirmed under fluoroscopy prior to placement of the 7-French sheath.    Once the 7-French sheath was placed, a 7-French Swan-Ganz catheter was  advanced into the sheath and advanced up into the right atrium, right  ventricle, pulmonary artery and pulmonary capillary wedge pressure.  Hemodynamic measurements and saturation measurements were made in the usual  fashion. Following these measurements, the Swan-Ganz catheter was removed  followed by removal of the 7-French sheath. Hemostasis was achieved. There  were no complications from the procedure.    PROCEDURE FINDINGS: Mean right atrial pressure 5, right ventricular  pressure 95/0, right ventricular end-diastolic pressure 8, pulmonary artery  pressure 91/31, mean pulmonary pressure 52, end-expiratory pulmonary  capillary wedge pressure 9. Cardiac output via thermodilution 5.7 L/min,  3.2 L/min per m sq. Cardiac output based on Fick method  using a pulmonary  artery saturation of 67% and arterial saturation of 95, 5.57 L/min, 3.13  L/min per m sq. Pulmonary vascular resistance based on Fick cardiac output,  7.7 Wood units. Pulmonary vascular resistance based on thermodilution  cardiac output, 7.5 Wood units.    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 Fortunately, she has no significant change compared to her  prior catheterization 2 years ago. Based on these findings, we will  continue her current therapy, including Flolan at 29 ng/kg per minute and  her PDE5 inhibitor. Following today's catheterization, we will plan on  seeing her back in clinic in approximately 3 months with a repeat 6-minute  walk test.                          Electronically signed by:  Kermit Balo. Paulla Fore, M.D. 06/27/2011 11:17 P    DD: 06/24/2011    DT: 06/24/2011 10:08 A   DocNo.: [id number]  [id number]           [id number].[facility]    Referring Physician:  SELF REFERRED        Primary Care Physician:  Fredderick Erb  [address]  [place] [address]    cc:

## 2011-06-24 NOTE — Discharge Instructions (Addendum)
NEED FOLLOW-UP WITH PRIMARY CARE PHYSICIAN FOR CONCERNS OF PRIMARY HYPERPARATHYROIDISM   (ELEVATED CALCIUM LEVEL AND INTACT PTH ).       Pneumonia    You have been diagnosed with pneumonia.    Pneumonia is an infection of the small air tubes and sacs of the lungs. It can be caused by a virus, bacteria or fungus.     Symptoms include fever, shortness of breath and a cough that produces a green or yellow material. You may have chest pain, vomiting, or headache.    Some people with pneumonia must stay in the hospital. Patients with certain risk factors, like diabetes, cancer, alcoholism, or another chronic medical condition, can be difficult to treat. The physician has determined that it is safe for you to go home.    Follow up closely with your doctor.    Do not smoke. Smoking may cause your pneumonia symptoms to become worse. Smoking also causes heart disease, cancer, and birth defects. If you smoke, ask your doctor for ideas about how to quit.   If you do not smoke, avoid others who do. Smoke will irritate your lungs and make your breathing worse while you have pneumonia.    YOU SHOULD SEEK MEDICAL ATTENTION IMMEDIATELY, EITHER HERE OR AT THE NEAREST EMERGENCY DEPARTMENT, IF ANY OF THE FOLLOWING OCCURS:   You wheeze or have trouble breathing.   You have a fever that won't go away.   You cough up blood.   You have chest pain.   You vomit and cannot keep medications down or you feel dizzy, weak or confused.   You feel worse or don't improve in 2 to 3 days.   You are unable to perform your normal activities.          Warfarin Discharge Instructions    The patient was given form PH115 (Warfarin Discharge Teaching Sheet) and verbally educated on the following:    Warfarin is an anticoagulant that slows down the clotting process and is therefore used to treat or prevent clots in the veins, arteries, lungs or heart.    You have been prescribed warfarin in order to prevent the formation of harmful blood  clots.  Patients on warfarin need to be monitored with a blood test called an "INR" at regular intervals.    Warfarin is usually taken once per day. In order to be consistent, you should take it at the same time each day.    Key points for patient education:    1) Importance of follow-up   It is very important that you attend all of your appointments as scheduled upon discharge.  If you weren't scheduled to be seen at a clinic or with your primary care provider to check your INR within 5 to 7 days of discharge, please call to make an appointment.     2) Importance of compliance  It is very important that you take your warfarin exactly as prescribed.     3) Importance of dietary restrictions  An important dietary measure that must be regular and consistent is your intake of vitamin K. Vitamin K is found in everything you eat, but the highest amount of vitamin K is found in green leafy vegetables like broccoli, different kinds of lettuce, cabbage and spinach. Alcohol can also affect your warfarin therapy. Bottom line:  keep your diet consistent.    4) Potential adverse drug reactions and drug-drug interactions  Warfarin can change the way other medications work, and other medications can  also change the way warfarin works. It is very important to inform your doctor, p      armacist, or the Anticoagulation Clinic about all other medicines that you are taking, including other prescription medications, over-the-counter medicines, nutritional supplements, or herbal products.

## 2011-06-24 NOTE — Procedures (Signed)
 PERFORMED ON - 06/24/2011 11:16:34;   DONE BY - RUELAS, JESSICA;  PROCEDURE - PDP RE-EVALUATION;   MD REQUEST #1: O2 PROTOCOL;   RCP RECOMMENDATION: #1: O2 PROTOCOL;   INDICATION: #1: O: TO KEEP SA02 >92;   EVALUATION OUTCOME: #1: AS RCP RECOMMENDED;   COMMENT: NO CHANGES IN PROTOCOL ORDERS  ELECTRONIC SIGNATURE DERIVED FROM A SINGLE CONTROLLED ACCESS PASSWORD:   Franne Grip ; 06/24/2011 11:16:34

## 2011-06-24 NOTE — Discharge Summary (Signed)
Patient Name:  Diana Chambers    Principal Diagnosis (required): Moraxella pneumonia    Hospital Problem List (required):  Active Hospital Problems    Diagnosis   . 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 Pneumonia [486]   . Hypoxia [799.02]   . Chronic right-sided congestive heart failure [428.0]   . Bilateral edema of lower extremity [782.3]   . 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 PAH (PULMONARY ARTERY HYPERTENSION) [416.8]      Resolved Hospital Problems    Diagnosis   No resolved problems to display.       Additional Hospital Diagnoses ("rule out" or "suspected" diagnoses, etc.):  Suspected primary hyperparathyroidism    Principal Procedure During This Hospitalization (required):  Right heart catheterization  Echocardiogram      Other Procedures Performed During This Hospitalization (required):  None    Procedure results are available in Chart Review in Epic.  For those providers external to [facility], the key procedure results are listed below:    TTE 5.25.2013    Technically adequate complete 2-D, spectral and color Doppler study.   Normal left ventricular size and systolic function (72% EF by 2D-4Chamber  method). No regional wall motion abnormalities are identified. Mild  diastolic dysfunction with impaired relaxation, suggesting normal LV  filling pressure at rest. The mitral E/A ratio is 0.7. The E  deceleration time is 124 ms. Mitral annular motion velocity is 12 cm/sec.  There is mild concentric hypertrophy of the LV. Dyssynergic motion of  the septum is noted, of uncertain significance (Post/Op Septum). Abnormal  septal motion and position suggesting the presence of right ventricular  pressure overload. Right ventricle is moderately to severely enlarged  with mild depressed There is hypertrophy of the right ventricle.   The left and right atria are mildly enlarged. The left atrium volume  index is 17 mls/M2. The inferior vena cava is of normal size. A trivial  pericardial effusion is seen. The pulmonary  artery is enlarged.   There is mild aortic regurgitation. There is mild pulmonic and tricuspid  regurgitation. Peak velocity of the TR envelope is 4.7 M/sec suggesting  elevated peak PA and RV systolic pressures at 88 + CVP mm Hg. All valves  appear normal.    Conclusions:  1) Normal left ventricular size and systolic function.  2) Mild concentric LV hypertrophy.  3) Abnormal septal motion suggesting RV pressure overload.  4) Right ventricle is moderately to severely enlarged with mild depressed  function.  5) Severe pulmonary hypertension.  6) Mild tricuspid regurgitation.  7) Compared to previous study on 04/16/2011, PA pressures are slighly lower.    Right heart catheterization 6.11 2013     PROCEDURE DESCRIPTION: Informed consent was obtained prior to procedure. A   time-out was performed prior to procedure to confirm patient name,   procedure type and procedure location. The patient was prepped and draped   in the usual fashion exposing the right groin. The right femoral vein was   identified with ultrasound. The skin above right femoral vein was   anesthetized with 10 mL of 2% lidocaine. Following numbing of the skin, a   7-French Precision sheath was used to access the right femoral vein with 1   stick without arterial puncture. Position in the venous system was   confirmed under fluoroscopy prior to placement of the 7-French sheath.   Once the 7-French sheath was placed, a 7-French Swan-Ganz catheter was   advanced into the sheath and advanced up  into the right atrium, right   ventricle, pulmonary artery and pulmonary capillary wedge pressure.   Hemodynamic measurements and saturation measurements were made in the usual   fashion. Following these measurements, the Swan-Ganz catheter was removed   followed by removal of the 7-French sheath. Hemostasis was achieved. There   were no complications from the procedure.   PROCEDURE FINDINGS: Mean right atrial pressure 5, right ventricular   pressure 95/0, right  ventricular end-diastolic pressure 8, pulmonary artery   pressure 91/31, mean pulmonary pressure 52, end-expiratory pulmonary   capillary wedge pressure 9. Cardiac output via thermodilution 5.7 L/min,   3.2 L/min per m sq. Cardiac output based on Fick method using a pulmonary   artery saturation of 67% and arterial saturation of 95, 5.57 L/min, 3.13   L/min per m sq. Pulmonary vascular resistance based on Fick cardiac output,   7.7 Wood units. Pulmonary vascular resistance based on thermodilution   cardiac output, 7.5 Wood units.   IMPRESSION AND PLAN: Ms. Shamone Sammis has severe pulmonary arterial   hypertension. Fortunately, she has no significant change compared to her   prior catheterization 2 years ago. Based on these findings, we will   continue her current therapy, including Flolan at 29 ng/kg per minute and   her PDE5 inhibitor. Following today's catheterization, we will plan on   seeing her back in clinic in approximately 3 months with a repeat 6-minute   walk test.        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 She also had significant dyspnea at rest and on exertion. She drove from her home with her husband 8 hours and came to [place] to the emergency department to be evaluated. Of note patient was seen one week prior to that in the emergency department for worsening dyspnea      Hospital Course by Problem (required):  Upon admission a sputum culture was obtained which grew later Moraxella catarrhalis penicillinase kneeproducing. She was initially treated with broad spectrum antibiotics including IV vancomycin and IV Zosyn and IV azithromycin. Her antibiotics were narrowed to azithromycin. She received a total of 9 days of  effective antibiotic therapy. Her chest x-ray was not conclusive for pneumonia however she had significant systemic symptoms including SIRS/bandemia very consistent with pneumonia and severe sepsis on admission. She was also found to be hypoxic and required oxygen therapy. She significantly improved on antibiotics and diuresis . Blood cultures were negative and not consistent with his Broviac catheter infection . Prior to her admission this time she was not hypoxic at baseline. She had tests and exercise testing with oxygen x2 during this admission. On lateral on her most recent past and exercise testing she was found to desaturate to 88% on room air after walking.  Prior to discharge she had a repeat cardiac catheterization which showed similar hemodynamics including cardiac output and pulmonary vascular resistance compared to 2 years ago.  It was also noted that her calcium level was slightly elevated, and intact PTH was inappropriately high consistent with primary hyperparathyroidism. She was counseled to follow up with her primary care for workup and treatment of possible primary hyperparathyroidism. She verbalized understanding of this issue.    Pneumonia: As above, secondary  to Moraxella . Additionally viral dfa negative/legionella negative ; mycoplasma negative     Pulmonary hypertension: We continued her outpatient treatment including epoprostenol at 29 ng/kg/min, sildenafil 80mg  tid and as well as her diuretics as above . Her digoxin level was slightly elevated at 2.1, and the dose was changed to 0.125 daily.      Fluid overload : her lasix  dose was changed from 80 mg in the morning and 40 mg in the evening to 60 mg p.o. twice daily. She had slight elevation in her BUN and creatinine prior to discharge of systems was negative fluid balance. He also continued her outpatient spironolactone    Anticoagulation: Her coumadin was held during this admission for right heart cardiac catheterization, she would  resume as outpatient    Tests Outstanding at Discharge Requiring Follow Up:  None    Discharge Condition (required):  Improved.    Key Physical Exam Findings at Discharge:  No significant physical examination findings at the time of discharge.    Core Measures for Heart Failure:  The patient was not required to have her left ventricular ejection fraction measured, and is not required to have a prescription for an ACE inhibitor, angiotensin receptor blocker, or beta blocker, as her heart failure syndrome is that of right-sided heart failure due to pulmonary hypertension.    Discharge Diet:  Low-salt.    Discharge Medications:  Discharge Medication List as of 06/24/2011  2:18 PM      CONTINUE these medications which have CHANGED    Details   digoxin (LANOXIN) 0.125 MG tablet Take 1 tablet by mouth daily., Disp-30 tablet, R-0, ePrescribe      furosemide (LASIX) 20 MG tablet Take 3 tablets by mouth 2 times daily., Disp-120 tablet, R-3, ePrescribe         CONTINUE these medications which have NOT CHANGED    Details   ALDACTONE 50 MG OR TABS one tab twice daily, Historical Med      Cholecalciferol 2000 UNITS CAPS Take 1 capsule by mouth daily., Historical Med      Estradiol (DIVIGEL) 0.25 MG/0.25GM GEL Apply 0.25 mg topically nightly., Historical Med      estradiol (ESTRING) 2 MG vaginal ring Insert 1 Ring vaginally Every 3 months. Follow package directions, Historical Med      FLOLAN 1.5 MG IV SOLR 29ng/kg/min, Historical Med      FOLIC ACID 400 MCG OR TABS 1 TABLET DAILY, Historical Med      medroxyPROGESTERone (PROVERA) 2.5 MG tablet Take 1.25 mg by mouth daily., Historical Med      potassium chloride (KLOR-CON M20) 20 MEQ tablet Take 1 tablet by mouth 3 times daily., 20 mEq, Oral, 3 TIMES DAILY Starting 12/18/2010, Until Discontinued, Disp-90 tablet, R-6, ePrescribe      sildenafil (REVATIO) 20 MG tablet Take 4 tablets by mouth 3 times daily. Take 4 tablets three times daily  , 80 mg, Oral, 3 TIMES DAILY Starting  12/18/2010, Until Discontinued, Disp-90 tablet, R-5, ePrescribe      warfarin (COUMADIN) 5 MG tablet Take 1 tablet by mouth daily. 5mg  x 5days a week 2.5mg  2 days/wk, 5 mg, Oral, DAILY Starting 12/18/2010, Until Discontinued, Disp-45 tablet, R-5, ePrescribe             Allergies:  Allergies   Allergen Reactions   . Allopurinol Hives   . Levaquin Swelling and Other     Severe joint pain     . Ofloxacin Nausea and Vomiting   .  Sulfa Drugs Unspecified       Discharge Disposition:  Home.    Discharge Code Status:  Full code / full care  This code status is not changed from the time of admission.    Follow Up Appointments:    Scheduled appointments:  No future appointments.    For appointments requested for after discharge that have not yet been scheduled, refer to the Post Discharge Referrals section of the After Visit Summary.    Discharging Physician's Contact Information:  Dr. Bluford Main.

## 2011-06-24 NOTE — Plan of Care (Signed)
Orders to discharge pt to home. Discharge instructions reviewed with pt including home meds, scripts, f/u appts, and reasons to return to hospital or call MD. Pharmacist spoke with pt regarding DC plan for medications. Pt verbalizes understanding of discharge instructions, denies questions at time of discharge. PIV and telemetry dc'd prior to discharge without problems. Pt given spare flolan cassette with 8 ice packs, pt storing in small cooler which she and husband use for this purpose regularly. Pt left unit at 1445 via wheelchair with all belongings.

## 2011-06-24 NOTE — Plan of Care (Signed)
Problem: Discharge Planning  Goal: Participation in care planning  Outcome: Met  Pt is kept NPO after midnight. Pre procedure checklist started.     Problem: Pain - Acute  Goal: Communication of presence of pain  Outcome: Met  Pt was able to sleep during the night without difficulty. No new changes noted in pt's condition during the shift. Will continue to monitor and will document any changes.

## 2011-06-24 NOTE — Interdisciplinary (Signed)
Pharmacist Discharge Medication Reconciliation    Navleen Gaymon  [id number]    Date performed: 06/24/2011    Discharge medication reconciliation performed by pharmacist.  All discrepancies identified discussed with physician and reflected in EPIC.    Karen Kays, PharmD  PGY-2 Transitions of Care resident  Pager: [phone]

## 2011-06-24 NOTE — Plan of Care (Signed)
Problem: Tissue Perfusion - Cardiopulmonary, Altered  Goal: Hemodynamic stability  Outcome: Met  Pt heart rhythm is SR with first degree AVB noted PRI 0.25, bp this am 114/55. Pt had right heart cardiac cath this am through her right groin, tolerated procedure, right groin is intact with bandaid, no bleeding noted no hematoma noted, pulses strong, no numbness or tingling noted. The pt is a/ox3, bedrest done at 0945 per dr Paulla Fore order, voiding without difficulty. No c/p or pressure. Will continue to monitor and update as needed.    Problem: Breathing Pattern - Ineffective  Goal: Respiratory rate, rhythm and depth return to baseline  Outcome: Not Met  Slight sob noted with exertion, productive cough noted to be clear and thin and small amount per patient, no assessed. Trace edema noted the pt ble, lung fields have crackles noted to LM/LL/RL lobes all meds given as ordered, voiding with out difficulty. Will continue to monitor and update as needed. Flolan infusing to the pts left chest broviac, on ice changed every 6 hours, cassette due at 1400 today, 80 ml/ day and 45,000 ng/ml noted, extra pump in room, dressing to be changed tomorrow per pt normal home regimen. Site is clean dry and intact with gauze and tegaderm noted with paper tape to secure site. Will continue to monitor and update as needed.    Problem: Discharge Planning  Goal: Participation in care planning  Outcome: Met  Pt lives in [place] with her supportive husband. No definite d/c date at this time. Pt states she may "possibly" be discharged after cath this am, no new orders at this time. Will continue to monitor and update as needed.    Problem: Infection  Goal: Absence of infection signs and symptoms  Outcome: Met  Pt is afebrile this am. 98.1 orally, no sweats or chills noted.   Lab Results   Component Value Date     WBC 5.9 06/24/2011     RBC 4.60 06/24/2011     HGB 10.5 06/24/2011     HCT 33.4 06/24/2011     MCV 72.6 06/24/2011     MCHC 31.4 06/24/2011     RDW  17.8 06/24/2011     PLT 106 06/24/2011     PLT 179 09/12/2008     MPV 9.3 06/23/2011   will continue to monitor and update as needed.    Problem: Pain - Acute  Goal: Communication of presence of pain  Intervention: Encourage patient/ family to verbalize factors contributing to pain  Pt denies pain, no pain meds requested or required. Will continue to monitor and update as needed.

## 2011-06-24 NOTE — Progress Notes (Signed)
Pharmacist Discharge Medication Education Note:    Diana Chambers  [id number]   LOS: 9 days     Teaching Date: 06/24/2011     Medication education done prior to discharge.  All medication indications, dosing, frequencies and common side effects gone over with patient.  Discharge medication list (MedAction Plan) provided and all changes from prior to admission discussed with patient.      Medication changes from admission:   New medications: none   Dose adjustments: decreased digoxin to 125 mcg daily due to elevated level of 2.1, furosemide decreased to 60 mg bid   Discontinued medications: none     All prescriptions sent to [facility] in [place] per patient's request.   For all other medications ordered upon discharge, confirmed patient had adequate supply at home.      Patient verbalized understanding of all discharge instructions.      Karen Kays, PharmD  PGY-2 Transitions of Care resident  Pager: [phone]

## 2011-06-27 LAB — PATHOLOGY REVIEW

## 2011-07-09 ENCOUNTER — Encounter (INDEPENDENT_AMBULATORY_CARE_PROVIDER_SITE_OTHER): Payer: Self-pay | Admitting: Rheumatology

## 2011-10-08 ENCOUNTER — Encounter (INDEPENDENT_AMBULATORY_CARE_PROVIDER_SITE_OTHER): Payer: Self-pay | Admitting: Rheumatology

## 2011-10-08 ENCOUNTER — Ambulatory Visit (INDEPENDENT_AMBULATORY_CARE_PROVIDER_SITE_OTHER): Admitting: Rheumatology

## 2011-10-08 ENCOUNTER — Encounter (HOSPITAL_COMMUNITY): Payer: Self-pay | Admitting: Pulmonary Medicine

## 2011-10-08 ENCOUNTER — Ambulatory Visit (HOSPITAL_COMMUNITY): Admitting: Pulmonary Medicine

## 2011-10-08 VITALS — BP 127/61 | HR 79 | Temp 98.1°F | Ht 62.0 in | Wt 151.9 lb

## 2011-10-08 VITALS — BP 128/70 | HR 78 | Temp 98.3°F

## 2011-10-08 MED ORDER — FUROSEMIDE 20 MG OR TABS
60.0000 mg | ORAL_TABLET | Freq: Once | ORAL | Status: DC | PRN
Start: 2011-10-08 — End: 2016-02-26

## 2011-10-08 MED ORDER — DIGOXIN 0.25 MG OR TABS
250.00 ug | ORAL_TABLET | Freq: Every day | ORAL | Status: DC
Start: ? — End: 2016-05-16

## 2011-10-08 NOTE — Progress Notes (Signed)
CLINIC: [facility]    REPORT TYPE: NOTE    Dictating Practitioner: [place] Balo. Paulla Fore, M.D.    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 She returns to clinic  for routine followup. Since she was last seen, she was discharged from the  hospital in June 2013. She has been using oxygen continuously via an Inogen  oxygen concentrator at 2 L/min. She reports otherwise symptoms of shortness  of breath are stable, no recent episodes of chest pains, palpitations. No  worsening edema or abdominal distention. She reports that she continues to  take Lasix 60 mg p.o. b.i.d. and takes an additional 60 mg approximately  1-2 times monthly. Otherwise her exercise capacity remains stable.    REVIEW OF SYSTEMS: Negative, other than as specified in HPI.    ALLERGIES: ALLOPURINOL, LEVAQUIN, OFLOXACIN, SULFA DRUGS.    CURRENT MEDICATIONS  1. Aldactone 50 mg p.o. b.i.d.  2. Calcium with vitamin D.  3. Digoxin 0.25 mg p.o. daily.  4. Flolan continuous infusion 29 ng/kg per minute.  5. Lasix 60 mg p.o. b.i.d., taking an additional 60 mg p.o. daily  approximately 1-2 times monthly.  6. Potassium chloride 20 mEq p.o. t.i.d.  7. Revatio 80 mg p.o. t.i.d.  9. Coumadin 5 mg 5 days a week, 2.5 mg 2 days a week.    PHYSICAL EXAMINATION  VITAL SIGNS: Blood pressure 127/61, pulse 79, temperature 98.1, weight 151  pounds, height 5 feet 2 inches, saturation 96% on 2 L nasal cannula.  GENERAL: Awake, alert, oriented, no acute distress. Able to speak in full  sentences. HEENT: Oropharynx clear.  NECK: Flat neck veins. No thyromegaly, no lymphadenopathy.  HEART: Regular rate and rhythm, loud second heart sound, positive systolic  murmur. No S3 noted.  LUNGS: Faint rales at bases bilaterally, no  wheezes.  ABDOMEN: Obese, soft, nondistended, nontender, positive bowel sounds, no  organomegaly noted.  EXTREMITIES: Trace edema, bilateral lower extremities.  SKIN: Positive prostacyclin skin rash.    ASSESSMENT: Diana Chambers is a 60 year old female with a history  WHO group I pulmonary arterial hypertension associated with connective  tissue disease, who returns to clinic with stable symptoms, New York Heart  Association functional class II/early III.    PLAN: We will continue her on her current dose of IV Flolan 29 ng/kg per  minute, as well as her dose of sildenafil 80 mg p.o. t.i.d.  We will also  continue her current diuretic regimen, including Lasix 60 mg p.o. b.i.d.  with an additional 60 mg as needed to maintain euvolemia. We will plan to  see her back in clinic in approximately 4 months. With that clinic visit,  we will obtain a 6-minute walk test for an objective assessment of her  exercise capacity and right heart function.                        Electronically signed by:  [place] Balo. Paulla Fore, M.D. 10/20/2011 03:42 P          DD: 10/08/2011    DT: 10/08/2011 02:58 P   DocNo.: [id number]  [id number]                 [id number]  Referring Physician:  SELF REFERRED        Primary Care Physician:  NO PCP PER PATIENT        cc:

## 2011-10-08 NOTE — Progress Notes (Signed)
This office note has been dictated.

## 2011-10-09 NOTE — Progress Notes (Signed)
CLINIC: [facility]    REPORT TYPE: NOTE    Dictating Practitioner: Eugenia Mcalpine. Laverle Patter, M.D.    DATE OF SERVICE:  10/08/2011    REASON FOR VISIT: FOLLOWUP        DIAGNOSIS:  Systemic lupus erythematosus.    SUBJECTIVE: Diana Chambers returns for rheumatology followup 4-1/2 months since her  last visit. She had a medically eventful time in those months with  hospitalizations for congestive failure and an infectious pneumonia. After  the pneumonia her respiratory status required that she be on supplemental  oxygen at 2 L/min and she is doing well on that regimen. She reports that  also during the 4 or 5 months since the last visit, she has had a few  episodes of painful joints, usually at one site at a time where the pain  was a 5-7 out of 10. There was slight swelling, no erythema, and the  symptoms did not last more than a few days with no medical intervention. It  has affected her ankle and her hands in particular. She has had no skin  rash and no fevers except when she was infected. She continues on her  Revatio for her pulmonary hypertension. She reports that she had a heart  catheterization before being discharged from her last hospitalization and  it showed only slight deterioration in the pulmonary artery pressures.    OBJECTIVE: Complete joint examination today is normal, with no swelling, no  erythema, and full range of motion in all joints without discomfort. Skin  shows her Flolan-induced skin changes.    LABORATORY STUDIES:  Laboratory studies done at the last visit show that  her antidouble-stranded DNA antibody level remains high and her serum  complements remain normal. Urinalysis when we last saw her was completely  normal. During her recent hospitalization when she was infected, she did  have 2+ protein but no active sediment.    ASSESSMENT AND PLAN: I am not sure what the brief flares of joint pain are.  They are not typical of lupus arthritis nor are they typical of gout, which  is a  possibility because her uric acid tends near 10, primarily because of  her Lasix diuretic. For now since these attacks are very self-limited and  relatively infrequent, we will not institute any medical intervention. She  will call me if they persist more or become much more frequent and we will  do further studies and initiate some therapy at that time. She is scheduled  to come back to see Dr. Paulla Fore in Pulmonary in 4 months and they will  schedule a return visit with me at the same time.                        Electronically signed by:  Eugenia Mcalpine. Laverle Patter, M.D. 10/09/2011 01:33 P          DD: 10/08/2011    DT: 10/09/2011 08:16 A   DocNo.: [id number]  [id number]                 [id number]    Referring Physician:  SELF REFERRED        Primary Care Physician:  NO PCP PER PATIENT        cc:

## 2011-12-24 ENCOUNTER — Encounter (HOSPITAL_COMMUNITY): Payer: Self-pay | Admitting: Pulmonary Medicine

## 2012-01-28 ENCOUNTER — Encounter: Payer: Self-pay | Admitting: Hospital

## 2012-02-11 ENCOUNTER — Ambulatory Visit
Admission: RE | Admit: 2012-02-11 | Discharge: 2012-02-11 | Disposition: A | Discharge status: Home / Self Care | Payer: Medicare Other | Source: Ambulatory Visit | Attending: Pulmonary Medicine | Admitting: Pulmonary Medicine

## 2012-02-11 ENCOUNTER — Ambulatory Visit (INDEPENDENT_AMBULATORY_CARE_PROVIDER_SITE_OTHER): Payer: Medicare Other

## 2012-02-11 ENCOUNTER — Encounter (INDEPENDENT_AMBULATORY_CARE_PROVIDER_SITE_OTHER): Payer: Self-pay | Admitting: Rheumatology

## 2012-02-11 ENCOUNTER — Ambulatory Visit (HOSPITAL_BASED_OUTPATIENT_CLINIC_OR_DEPARTMENT_OTHER): Payer: Medicare Other | Admitting: Pulmonary Medicine

## 2012-02-11 ENCOUNTER — Encounter (HOSPITAL_COMMUNITY): Payer: Self-pay | Admitting: Pulmonary Medicine

## 2012-02-11 ENCOUNTER — Ambulatory Visit (INDEPENDENT_AMBULATORY_CARE_PROVIDER_SITE_OTHER): Payer: Medicare Other | Admitting: Rheumatology

## 2012-02-11 VITALS — BP 139/71 | HR 74 | Temp 98.1°F

## 2012-02-11 VITALS — BP 131/95 | HR 84 | Temp 98.3°F | Resp 16 | Ht 62.0 in | Wt 153.4 lb

## 2012-02-11 DIAGNOSIS — M329 Systemic lupus erythematosus, unspecified: Secondary | ICD-10-CM

## 2012-02-11 DIAGNOSIS — I2721 Secondary pulmonary arterial hypertension: Secondary | ICD-10-CM

## 2012-02-11 DIAGNOSIS — R0609 Other forms of dyspnea: Secondary | ICD-10-CM | POA: Insufficient documentation

## 2012-02-11 DIAGNOSIS — I2789 Other specified pulmonary heart diseases: Secondary | ICD-10-CM

## 2012-02-11 MED ORDER — OMEGA-3 FATTY ACIDS 1000 MG OR CAPS
1.00 g | ORAL_CAPSULE | Freq: Every day | ORAL | Status: DC
Start: ? — End: 2013-02-22

## 2012-02-11 NOTE — Procedures (Addendum)
FINAL  [Page 1]                       Pulmonary Function Laboratory   PATIENT Information             Philis Nettle., Director      Last Name:[facility]           [address]            [name]           [place], [place] [address]             ON#:[id number]  LK#[id number]                 Ph: [phone]              PATIENT LOCATION:[facility]                                           Technician:JIM MURDOCK                                           REFERRING M.D.: Poch                                                                                      EXERCISE STUDY                                           DATE OF STUDY:02/11/2012 12:30 PM                                                Test Status - Preliminary or   Final?:F           Age:62 Ht:          cm Wt:          kg   Sex:F Purpose:6 Min Walk                           Page 1 28f 1 [id number]                      Position           Sitting Stand  ---     ---    ---     ---           Nasal Cannula L/M  2.0     2.0    2.0     ---    ---     ---           Speed (MPH)  Rest    Rest   Walk    ---    ---     ---           Elevation (%)                                                                             Duration (min)     10:00   5:00   6:00                                            ARTERIAL BLOOD                                                      GASES           PaO2 (mmHg)                                                                               PaCO2 (mmHg)                                                                              PH                                                                                        %O2Hb(Co oximeter)                                                                        SpO2               95      95     89  CARDIOVASCULAR                                                      Heart Rate (bpm)   80      78     136                                              BP systolic (mmHg)                                                                        BP diastolic (mmHg)                                                                        PERCEIVED SYMPTOMS (0 - 10)                                         Fatigue                                3.0                                         Breathlessness                         3.0                                                               Hgb (gm/dl)            COHb (gm%)                                  Technician Comments:Patient completed protocol and walked   .                                  Interpretation:                      The patient walked in 6 minutes. This is a   increase,  which represents a  1% change from           the previous test dated 04/26/11.                       [name], MD [facility]

## 2012-02-11 NOTE — Progress Notes (Signed)
Rheumatology follow up note.    Subjective: Diana Chambers presents for follow up for management of SLE. She reports no flare of joint pain or other lupus symptoms in the 4 months since the last visit. She is taking no medication for her lupus. She has just had a follow up visit with Dr. Paulla Fore for her pulmonary hypertension and he informed her that everything appeared stable with no deterioration since the last visit. She is continuing on the combination of Flolan infusion and Revatio.    Objective: Complete joint exam today is normal. She has the Flolan induced skin rash but no sign of a lupus rash.  Laboratory tests done 3 months ago showed a normal CBC and chem panel. She reports her primary MD did a urinalysis recently that was normal.    A:&P: Her lupus is remaining quiet. We will make no change.  RV scheduled for 4 months.

## 2012-02-11 NOTE — Progress Notes (Signed)
This office note has been dictated.

## 2012-02-11 NOTE — Procedures (Signed)
No note

## 2012-02-11 NOTE — Patient Instructions (Signed)
My name is Erica and I was the medical assistant that was caring for you today. If you have any questions or concerns regarding today's visit, please call me at [phone].Thank you for choosing [facility].

## 2012-02-12 NOTE — Progress Notes (Signed)
CLINIC: [facility]    REPORT TYPE: NOTE    Dictating Practitioner: Lennie Muckle, M.D.    DATE OF SERVICE:  02/11/2012    REASON FOR VISIT: FOLLOWUP FOR PULMONARY ARTERIAL HYPERTENSION        HISTORY OF PRESENT ILLNESS: [name] is a 62 year old female  with a past medical history of WHO group 1 pulmonary arterial hypertension,  associated with the presence of connective tissue disease, who returns to  clinic for followup. Since she was last seen, she reports stable  cardiopulmonary symptoms with no worsening dyspnea on exertion, no exercise  limitations, no worsening lower extremity edema. She reports that she takes  an additional dose of 60 mg of Lasix once every 2-3 weeks. No episodes of  palpitations, no syncope, no presyncope. She has had no problems with her  tunneled catheter and reports that she continues to use supplemental oxygen  at 2 L/min, using her Inogen device.    ALLERGIES  1. ALLOPURINOL.  2. LEVAQUIN.  3. OFLOXACIN.  4. SULFA DRUGS.    CURRENT MEDICATIONS  1. Aldactone 50 mg p.o. b.i.d.  2. Calcium with Vitamin D.  3. Digoxin 250 mcg p.o. daily.  4. Estradiol 0.25 topically nightly.  5. Veletri continuous infusion, 29 ng/kg per minute.  6. Lasix 60 mg p.o. b.i.d. and 60 mg p.o. p.r.n., using approximately once  every 3 weeks.  7. Omega-3 fatty acids.  8. Potassium chloride 20 mEq p.o. t.i.d.  9. Revatio 80 mg p.o. t.i.d.  10. Coumadin 5 mg 5 days a week, 2.5 mg 2 days a week.    PHYSICAL EXAMINATION  VITAL SIGNS: Blood pressure 131/95, pulse 84, respirations 16, temperature  98.1, weight 152 pounds. Height 5 feet 2 inches. Saturation 93% on 2 L  nasal cannula.  GENERAL: Awake, alert, oriented, in no acute distress, able to speak in  full sentences.  HEENT: Oropharynx clear.  NEUROLOGIC: Flat neck veins, no thyromegaly, no lymphadenopathy.  HEART: Regular rate and rhythm. Loud second heart sound. Positive systolic  murmur. No S3 heard.  LUNGS: Rales at bases bilaterally, no worse  than prior exam.  ABDOMEN: Soft, nondistended, nontender. Positive bowel sounds. No  organomegaly noted.  EXTREMITIES: Trace edema, bilateral lower extremities.    DIAGNOSTIC DATA: Six-minute walk test dated 02/11/2012: Saturation at rest  95%, saturation with exercise, on 2 L, 89%. Heart rate at rest 90, heart  rate with exercise 136. Total distance walked 481 m.    ASSESSMENT: [name] is a 62 year old female with a history of  WHO group 1 pulmonary arterial hypertension associated with the presence of  connective tissue disease, who is stable, New York Heart Association  functional class II/III symptoms.    PLAN: We will continue her current therapy, including IV epoprostenol, and  sildenafil 80 mg p.o. t.i.d. Additionally, we will continue her current  therapy with digoxin 0.25 mg p.o. daily, and we will continue her current  dose of diuretics, including Aldactone 50 mg p.o. b.i.d. and Lasix 60 mg  p.o. b.i.d., with Lasix p.r.n. as needed for increasing weight gain. We  will plan to see her back in approximately 4-6 months, with a repeat  6-minute walk test.                        Electronically signed by:  Lennie Muckle, M.D. 02/12/2012 10:49 A          DD: 02/11/2012  DT: 02/12/2012 06:51 A   DocNo.: [id number]  [id number]                 [id number]    Referring Physician:  SELF          cc:

## 2012-05-31 ENCOUNTER — Encounter (HOSPITAL_COMMUNITY): Payer: Self-pay | Admitting: Pulmonary Medicine

## 2012-08-11 ENCOUNTER — Encounter (HOSPITAL_COMMUNITY): Payer: Self-pay | Admitting: Pulmonary Medicine

## 2012-08-11 ENCOUNTER — Encounter (INDEPENDENT_AMBULATORY_CARE_PROVIDER_SITE_OTHER): Payer: Self-pay | Admitting: Rheumatology

## 2012-08-11 ENCOUNTER — Ambulatory Visit
Admission: RE | Admit: 2012-08-11 | Discharge: 2012-08-11 | Disposition: A | Discharge status: Home / Self Care | Payer: Medicare Other | Attending: Pulmonary Medicine | Admitting: Pulmonary Medicine

## 2012-08-11 ENCOUNTER — Ambulatory Visit (INDEPENDENT_AMBULATORY_CARE_PROVIDER_SITE_OTHER): Payer: Medicare Other | Admitting: Rheumatology

## 2012-08-11 ENCOUNTER — Ambulatory Visit: Payer: Medicare Other | Attending: Pulmonary Medicine | Admitting: Pulmonary Medicine

## 2012-08-11 ENCOUNTER — Other Ambulatory Visit: Payer: Medicare Other | Attending: Pulmonary Medicine

## 2012-08-11 VITALS — BP 134/81 | HR 86 | Temp 98.2°F | Ht 62.0 in | Wt 156.0 lb

## 2012-08-11 VITALS — BP 134/81 | HR 86 | Temp 98.2°F

## 2012-08-11 DIAGNOSIS — I270 Primary pulmonary hypertension: Secondary | ICD-10-CM | POA: Insufficient documentation

## 2012-08-11 DIAGNOSIS — M329 Systemic lupus erythematosus, unspecified: Secondary | ICD-10-CM | POA: Insufficient documentation

## 2012-08-11 DIAGNOSIS — R0609 Other forms of dyspnea: Secondary | ICD-10-CM | POA: Insufficient documentation

## 2012-08-11 LAB — URINALYSIS
Bilirubin: NEGATIVE
Blood: NEGATIVE
Glucose: NEGATIVE
Ketones: NEGATIVE
Leuk Esterase: NEGATIVE
Nitrite: NEGATIVE
Protein: NEGATIVE
Specific Gravity: 1.009 (ref 1.002–1.030)
pH: 6 (ref 5.0–8.0)

## 2012-08-11 LAB — C4, BLOOD: C4: 20 mg/dL (ref 10–40)

## 2012-08-11 LAB — C3, BLOOD: C3: 118 mg/dL (ref 90–180)

## 2012-08-11 NOTE — Progress Notes (Signed)
This office note has been dictated.

## 2012-08-11 NOTE — Procedures (Signed)
No note

## 2012-08-11 NOTE — Progress Notes (Signed)
Rheumatology follow up note.    #1. Systemic Lupus Erythematosus    SUBJECTIVE:   Diana Chambers presents for follow up for management of SLE, ILD and PVH. She reports that she is stable as far as her lungs are concerned. She has had no flare of lupus dermatitis, arthritis or fevers.  She is taking no medication for controlling her lupus.    OBJECTIVE:   Complete joint exam today shows no sign of inflammation anywhere. Her skin has classic flolan rash, but no sign of lupus dermatitis.    Laboratory tests done by her primary physician 7 weeks ago were reviewed. Of note is normal CBC and platelet count. Her creatinine is stable at  1.05,    A:&P: Her lupus appears to be remaining quiet. We will not add any medication to her regimen.    RV scheduled for 3 months.

## 2012-08-11 NOTE — Patient Instructions (Signed)
My name is Erica and I was the medical assistant that was caring for you today. If you have any questions or concerns regarding today's visit, please call me at [phone].Thank you for choosing [facility].

## 2012-08-11 NOTE — Progress Notes (Signed)
CLINIC: [facility]    REPORT TYPE: NOTE    Dictating Practitioner: Lennie Muckle, M.D.    DATE OF SERVICE:  08/11/2012    REASON FOR VISIT: FOLLOWUP FOR PULMONARY ARTERIAL HYPERTENSION        HISTORY OF PRESENT ILLNESS: [name] is a 62 year old female  with a history of WHO group 1 pulmonary arterial hypertension associated  with the presence of connective tissue disease who returns to clinic for  followup. She reports that her symptoms are stable. She reports no changes  in her exercise capacity; no worsening chest pains, palpitations, syncope,  lower extremity edema, or abdominal distention. She recently was traveling  via car all the way to [place] with her husband to look for farms.  She did notice that at certain elevations, her oxygen saturation would  decline and, when she got closer to sea [place], her oxygen saturations were  improved. This was particularly noticeable. Her home elevation is 3300 feet  and they are looking to settle and [place] a farm [place] as she felt  significantly better and had significantly more consistent oxygen  saturations above 90%.    Otherwise, she remains on the same dose of intravenous epoprostenol 29  ng/kg per minute, as well as the same dose of sildenafil, which is 80 mg  p.o. 3 times daily, as well as the same dose of diuretic, including Lasix  60 mg p.o. b.i.d. She occasionally takes an extra dose of Lasix to treat  symptomatic worsening of her lower extremity edema.    REVIEW OF SYSTEMS: Negative, other than as specified in HPI.    ALLERGIES  1. ALLOPURINOL.  2. LEVAQUIN.  3. OFLOXACIN.  4. SULFA.    CURRENT MEDICATIONS  1. Aldactone 50 mg p.o. b.i.d.  2. Digoxin 0.25 mg daily.  3. Flolan.  4. Intravenous epoprostenol 29 ng/kg per minute.  5. Lasix 60 mg p.o. b.i.d.  6. Potassium chloride 20 mEq p.o. t.i.d.  7. Sildenafil 80 mg p.o. t.i.d.  8. Coumadin as directed by [facility].    PHYSICAL EXAMINATION  VITAL SIGNS: Blood pressure 134/81,  pulse 86, temperature 98.2, weight 156  pounds, height 5 feet 2 inches, saturation 94% on 2 liters continuous nasal  cannula.  GENERAL: Awake, alert, oriented, in no acute distress. Able to speak in  full sentences.  HEENT: Oropharynx clear.  NECK: Flat neck veins. No thyromegaly, no lymphadenopathy.  HEART: Regular rate and rhythm. Loud second heart sound. Positive systolic  murmur.  LUNGS: Rales at bases bilaterally.  ABDOMEN: Soft, nondistended, nontender. Positive bowel sounds. No  organomegaly noted.  EXTREMITIES: No clubbing, cyanosis, or edema.  SKIN: No new rashes.    SIX-MINUTE WALK TEST, DATED 08/11/2012: Heart rate at rest 77; heart rate  with exercise 137. Saturation at rest 97%; saturation with exercise 91%.  This was performed on 2 L/min nasal cannula. Total distance walked 455 m.    ASSESSMENT: [name] is a 62 year old female with a history  of WHO group 1 pulmonary arterial hypertension associated with the presence  of connective tissue disease, who remains New York Heart Association  functional class III, with stable symptoms since she was last seen. Her  saturation levels have improved on her 6-minute walk test with supplemental  oxygen. We performed a repeat walk test off supplemental oxygen and her  saturations with exertion declined to 89% while walking here off oxygen and  then improved back to above 90% with  supplemental oxygen at 2 L/min.    PLAN: We will continue her current dose of PAH medications, including  intravenous epoprostenol at 29 ng/kg per minute, as well as sildenafil 80  mg p.o. t.i.d. Continue her current dose of diuretics, Lasix 60 mg p.o.  b.i.d. We will check labs today to ensure that her kidney function and  electrolytes are normal. We will repeat her NT-proBNP; the last value was  elevated at 1700 and we will check this to ensure the level has not  escalated further.    We will plan to see her back in approximately 6 months for routine  followup. We do  recommend that she remain using supplemental oxygen. This  will be particularly true when she returns home to an elevation of 3300  feet where her oxygen saturations are certain to be lower than they were  recorded today with exertion in clinic.    CC:                          Electronically signed by:  Lennie Muckle, M.D. 08/19/2012 01:31 P          DD: 08/11/2012    DT: 08/11/2012 03:25 P   DocNo.: [id number]  [id number]                 [id number]    Referring Physician:  [facility]          cc:

## 2012-08-12 LAB — ANTI-DSDNA, BLOOD: Anti-DSDNA: >200 IU — ABNORMAL HIGH (ref 0–24)

## 2013-01-19 ENCOUNTER — Ambulatory Visit (HOSPITAL_COMMUNITY): Payer: Medicare Other | Admitting: Pulmonary Medicine

## 2013-01-19 ENCOUNTER — Encounter (INDEPENDENT_AMBULATORY_CARE_PROVIDER_SITE_OTHER): Payer: Medicare Other | Admitting: Rheumatology

## 2013-02-02 ENCOUNTER — Encounter (INDEPENDENT_AMBULATORY_CARE_PROVIDER_SITE_OTHER): Payer: Medicare Other | Admitting: Rheumatology

## 2013-02-20 ENCOUNTER — Inpatient Hospital Stay (HOSPITAL_COMMUNITY): Payer: Medicare Other | Admitting: Pulmonary Medicine

## 2013-02-20 ENCOUNTER — Inpatient Hospital Stay
Admission: EM | Admit: 2013-02-20 | Discharge: 2013-02-22 | DRG: 195 | Disposition: A | Discharge status: Home / Self Care | Payer: Medicare Other | Attending: Pulmonary Medicine | Admitting: Pulmonary Medicine

## 2013-02-20 DIAGNOSIS — M3500 Sicca syndrome, unspecified: Secondary | ICD-10-CM | POA: Diagnosis present

## 2013-02-20 DIAGNOSIS — M359 Systemic involvement of connective tissue, unspecified: Secondary | ICD-10-CM | POA: Diagnosis present

## 2013-02-20 DIAGNOSIS — R011 Cardiac murmur, unspecified: Secondary | ICD-10-CM | POA: Diagnosis present

## 2013-02-20 DIAGNOSIS — I270 Primary pulmonary hypertension: Secondary | ICD-10-CM

## 2013-02-20 DIAGNOSIS — J189 Pneumonia, unspecified organism: Principal | ICD-10-CM | POA: Diagnosis present

## 2013-02-20 DIAGNOSIS — Z87891 Personal history of nicotine dependence: Secondary | ICD-10-CM

## 2013-02-20 DIAGNOSIS — R232 Flushing: Secondary | ICD-10-CM | POA: Diagnosis present

## 2013-02-20 DIAGNOSIS — I509 Heart failure, unspecified: Secondary | ICD-10-CM | POA: Diagnosis present

## 2013-02-20 DIAGNOSIS — I2789 Other specified pulmonary heart diseases: Secondary | ICD-10-CM | POA: Diagnosis present

## 2013-02-20 LAB — CBC WITH DIFF, BLOOD
ANC-Automated: 5.8 10*3/uL (ref 1.6–7.0)
Abs Lymphs: 1.4 10*3/uL (ref 0.8–3.1)
Abs Monos: 0.5 10*3/uL (ref 0.2–0.8)
Hct: 39.1 % (ref 34.0–45.0)
Hgb: 13.2 gm/dL (ref 11.2–15.7)
Lymphocytes: 18 % — ABNORMAL LOW (ref 19–53)
MCH: 25.9 pg — ABNORMAL LOW (ref 26.0–32.0)
MCHC: 33.8 % (ref 32.0–36.0)
MCV: 76.7 um3 — ABNORMAL LOW (ref 79.0–95.0)
MPV: 10.2 fL (ref 9.4–12.4)
Monocytes: 6 % (ref 5–12)
Plt Count: 125 10*3/uL — ABNORMAL LOW (ref 140–370)
RBC: 5.1 10*6/uL (ref 3.90–5.20)
RDW: 16.1 % — ABNORMAL HIGH (ref 12.0–14.0)
Segs: 75 % — ABNORMAL HIGH (ref 34–71)
WBC: 7.7 10*3/uL (ref 4.0–10.0)

## 2013-02-20 LAB — COMPREHENSIVE METABOLIC PANEL, BLOOD
ALT (SGPT): 11 U/L (ref 0–33)
AST (SGOT): 15 U/L (ref 0–32)
Albumin: 4 g/dL (ref 3.5–5.2)
Alkaline Phos: 85 U/L (ref 35–140)
BUN: 23 mg/dL — ABNORMAL HIGH (ref 6–20)
Bicarbonate: 24 mmol/L (ref 22–29)
Bilirubin, Tot: 0.55 mg/dL (ref <1.20)
Calcium: 11 mg/dL — ABNORMAL HIGH (ref 8.5–10.6)
Chloride: 97 mmol/L — ABNORMAL LOW (ref 98–107)
Creatinine: 1.19 mg/dL — ABNORMAL HIGH (ref 0.51–0.95)
GFR: 46 mL/min
Glucose: 138 mg/dL — ABNORMAL HIGH (ref 70–115)
Potassium: 3.8 mmol/L (ref 3.5–5.1)
Sodium: 134 mmol/L — ABNORMAL LOW (ref 136–145)
Total Protein: 8.9 g/dL — ABNORMAL HIGH (ref 6.0–8.0)

## 2013-02-20 LAB — INFLUENZA A/B PANEL (CALM LAB)
Influenza A, Rapid: NEGATIVE
Influenza B, Rapid: NEGATIVE

## 2013-02-20 LAB — CPK-CREATINE PHOSPHOKINASE, BLOOD: CPK: 36 U/L (ref 0–175)

## 2013-02-20 LAB — PROTHROMBIN TIME, BLOOD
INR: 2
PT,Patient: 21 s — ABNORMAL HIGH (ref 9.7–12.5)

## 2013-02-20 LAB — CKMB+INDEX (NO CPK), BLOOD: CK-MB: 1.4 ng/mL (ref 0.0–2.8)

## 2013-02-20 LAB — TROPONIN T, BLOOD
Troponin T: 0.02 ng/mL (ref <0.01)
Troponin T: <0.01 ng/mL (ref <0.01)

## 2013-02-20 LAB — PRO BNP, BLOOD: BNPP: 3671 pg/mL — ABNORMAL HIGH (ref 0–899)

## 2013-02-20 LAB — APTT, BLOOD: PTT: 39.1 s — ABNORMAL HIGH (ref 25.0–34.0)

## 2013-02-20 MED ORDER — SODIUM CHLORIDE 0.9 % IJ SOLN (CUSTOM)
3.0000 mL | Freq: Three times a day (TID) | INTRAMUSCULAR | Status: DC
Start: 2013-02-20 — End: 2013-02-22
  Administered 2013-02-20 – 2013-02-22 (×5): 3 mL via INTRAVENOUS

## 2013-02-20 MED ORDER — SODIUM CHLORIDE 0.9 % IV SOLN
1000.00 mg | Freq: Once | INTRAVENOUS | Status: AC
Start: 2013-02-20 — End: 2013-02-20
  Administered 2013-02-20: 1000 mg via INTRAVENOUS
  Filled 2013-02-20: qty 1000

## 2013-02-20 MED ORDER — ASPIRIN 325 MG OR TABS
325.00 mg | ORAL_TABLET | Freq: Once | ORAL | Status: AC
Start: 2013-02-20 — End: 2013-02-20
  Administered 2013-02-20: 325 mg via ORAL
  Filled 2013-02-20: qty 1

## 2013-02-20 MED ORDER — ACETAMINOPHEN 325 MG PO TABS
650.0000 mg | ORAL_TABLET | ORAL | Status: DC | PRN
Start: 2013-02-20 — End: 2013-02-22

## 2013-02-20 MED ORDER — AZITHROMYCIN 250 MG OR TABS
500.00 mg | ORAL_TABLET | Freq: Once | ORAL | Status: AC
Start: 2013-02-20 — End: 2013-02-20
  Administered 2013-02-20: 500 mg via ORAL
  Filled 2013-02-20: qty 2

## 2013-02-20 MED ORDER — SODIUM CHLORIDE 0.9 % IJ SOLN (CUSTOM)
3.0000 mL | INTRAMUSCULAR | Status: DC | PRN
Start: 2013-02-20 — End: 2013-02-22

## 2013-02-20 MED ORDER — FUROSEMIDE 10 MG/ML IJ SOLN
40.00 mg | Freq: Once | INTRAMUSCULAR | Status: AC
Start: 2013-02-20 — End: 2013-02-20
  Administered 2013-02-20: 40 mg via INTRAVENOUS
  Filled 2013-02-20: qty 4

## 2013-02-20 MED ORDER — MOXIFLOXACIN HCL 400 MG OR TABS
400.0000 mg | ORAL_TABLET | Freq: Every day | ORAL | Status: DC
Start: 2013-02-21 — End: 2013-02-21
  Administered 2013-02-21: 400 mg via ORAL
  Filled 2013-02-20: qty 1

## 2013-02-20 MED ORDER — SODIUM CHLORIDE 0.9 % IV SOLN
INTRAVENOUS | Status: DC | PRN
Start: 2013-02-20 — End: 2013-02-22

## 2013-02-20 NOTE — ED Notes (Signed)
Blood culture # 1 drawn from Left antecubital.    Sample labeled and sent to the lab.

## 2013-02-20 NOTE — ED Provider Notes (Signed)
History  Chief Complaint   Patient presents with    Chest Pain     flolan pt and presents wearing home O2 on 2 L ,sat 94%. Temp last night read 101.2 but it resolved with 2 xtra str tylenol. Pt is feeling SOB and chest pain develops with exertion. Pain also felt between shoulder blades. Pt states she "doesn't feel right"     Patient is a 63 year old female presenting with chest pain.   History provided by:  Patient  Chest Pain  Pain location:  Substernal area  Pain quality: pressure    Pain radiates to:  Mid back  Pain severity:  Mild  Onset quality:  Gradual  Duration:  2 days  Timing:  Intermittent  Progression:  Waxing and waning  Context: not breathing, not eating, no movement and not raising an arm    Relieved by:  Nothing  Worsened by:  Coughing  Ineffective treatments:  Oxygen  Associated symptoms: back pain and cough    Associated symptoms: no abdominal pain, no fatigue, no fever, no heartburn, no near-syncope and no shortness of breath    Pulm HTN dx 2001 on stable doses of Flolan since then.  Yesterday fever, tachcycardia cough.  Also with midsternal chest discomfort occaasionally radiating to back; pain waxes and wanes.      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    Family History   Problem Relation Age of Onset             History   Substance Use Topics    Smoking status: Former Smoker -- 0.50 packs/day for 5 years     Quit date: 01/13/1978    Smokeless tobacco: Never Used    Alcohol Use: No       Review of Systems   Constitutional: Negative for fever and fatigue.   Respiratory: Positive for cough. Negative for shortness of breath.    Cardiovascular: Positive for chest pain. Negative for near-syncope.   Gastrointestinal: Negative for heartburn and abdominal pain.   Musculoskeletal: Positive for back pain.   All other systems reviewed and are negative.      Physical Exam  BP 143/79    Pulse 110    Temp(Src)  100.1 F (37.8 C)    Resp 18    Ht 5\' 2"  (1.575 m)    Wt 72.661 kg (160 lb 3 oz)    BMI 29.29 kg/m2      SpO2 94%     Physical Exam   Constitutional: She appears well-developed. No distress.   HENT:   Head: Normocephalic and atraumatic.   Right Ear: External ear normal.   Left Ear: External ear normal.   Eyes: Conjunctivae are normal.   Neck: Normal range of motion.   Cardiovascular: Normal rate, regular rhythm and normal heart sounds.    Pulmonary/Chest: Effort normal and breath sounds normal.   Abdominal: Soft. She exhibits no mass. There is no tenderness.   Musculoskeletal: Normal range of motion. She exhibits no edema, tenderness or deformity.   Neurological: She is alert.   Skin: Skin is warm and dry. She is not diaphoretic.   Psychiatric: She has a normal mood and affect.   Nursing note and vitals reviewed.      ED Course/Medical Decision Making Narrative  Chest discomfort, fever  CXR with possible infiltrate; this  is clincially consistent with pneumonia   However, also with troponin 0.02; no obvious EKG changes but will trend serially  Seen in ED by pulmonary HTN fellow  Admission and treatment for CAP    EKG ST 105 RAD T wave inversions 2,3,avf seen previously  Repeat EKG ST 101 RAD, T wave inversions 2,3,avf, no sig change from preivous    Critical Care Time            Additional Notes      Home Medication List  Prior to Admission Medications   Prescriptions Last Dose Informant Patient Reported? Taking?   ALDACTONE 50 MG OR TABS   Yes No   Sig: one tab twice daily   Cholecalciferol 2000 UNITS CAPS   Yes No   Sig: Take 1 capsule by mouth daily.   Estradiol (DIVIGEL) 0.25 MG/0.25GM GEL   Yes No   Sig: Apply 0.25 mg topically nightly.   FLOLAN 1.5 MG IV SOLR   Yes No   Sig: 44HQ/PR/FFM   FOLIC ACID 384 MCG OR TABS   Yes No   Sig: 1 TABLET DAILY   digoxin (LANOXIN) 0.25 MG tablet   Yes No   Sig: Take 250 mcg by mouth daily.   estradiol (ESTRING) 2 MG vaginal ring   Yes No   Sig: Insert 1 Ring vaginally Every 3  months. Follow package directions   furosemide (LASIX) 20 MG tablet   No No   Sig: Take 3 tablets by mouth 2 times daily.   furosemide (LASIX) 20 MG tablet   No No   Sig: Take 3 tablets by mouth once as needed (edema).   medroxyPROGESTERone (PROVERA) 2.5 MG tablet   Yes No   Sig: Take 1.25 mg by mouth daily.   omega-3 fatty acids, OTC, 1000 MG capsule   Yes No   Sig: Take 1 g by mouth daily.   potassium chloride (KLOR-CON M20) 20 MEQ tablet   No No   Sig: Take 1 tablet by mouth 3 times daily.   sildenafil (REVATIO) 20 MG tablet   No No   Sig: Take 4 tablets by mouth 3 times daily. Take 4 tablets three times daily     warfarin (COUMADIN) 5 MG tablet   No No   Sig: Take 1 tablet by mouth daily. 5mg  x 5days a week 2.5mg  2 days/wk      Facility-Administered Medications: None       Glade Nurse, MD  02/20/13 2110

## 2013-02-20 NOTE — ED Notes (Signed)
Pharmacy in pt's room, dosing sheet at the bedside.

## 2013-02-20 NOTE — ED Floor Report (Addendum)
ED to [facility] is a 63 year old female    Chief Complaint   Patient presents with    Chest Pain     flolan pt and presents wearing home O2 on 2 L ,sat 94%. Temp last night read 101.2 but it resolved with 2 xtra str tylenol. Pt is feeling SOB and chest pain develops with exertion. Pain also felt between shoulder blades. Pt states she "doesn't feel right"       Admitted for: PNA; FLOLAN pt    Code Status:  Please refer to In-pt admitting doctors orders     Past Medical History   Diagnosis Date    Sjoegren syndrome 07/12/2009    Pulmonary HTN        No past surgical history on file.    Allergies: Allopurinol; Levaquin; Ofloxacin; and Sulfa drugs    Level of Care : Med Surg with Tele    ED Fall Risk: Yes    Skin issues:  yes    >> pt has redness/flushing to chest consistent with flolan use    Ambulatory:  yes    Sitter needed: no    Suicide Risk :  no    Isolation Required: no     >> If yes , what type of isolation:    Is patient in custody?  no    Is patient in restraints? no    Is patient on Heparin? no If yes, complete below:   Bolus=    Units      Units/hr   @   Mls/hour      Time of next PT/PTT draw:          Brief Summary of ED Visit (to include focused assessment and neuro status):    Pt on Flolan and 2L O2 per NC at home p/w 2 days SOB and CP midsternal radiating to back.   O2 sat on 2L 92-93%, increased to 3L w/ O2 sat 96%.  Pt is alert and oriented.  Breathing is slightly labored, RR 22-28.  BNP found to be elevated, given 40mg  lasix.   Given 324mg  ASA.  Infiltrates on xray consistent with PNA, pt given PO zithromax and IV rocephin.    The pump delivers in 24 hours is : 80 ml per 24 hrs  The dosage in nanograms/per/kg is: 29  Medication was last changed 8am today. Due to be changed 8am tomorrow.  How many bottles used to mix Flolan: 3  The bottle colors are: red  Pt's dosing weight is: 86kg  Pharmacy called  Harley Alto, RN Charge RN informed.  Verified settings and that pump is  in RUN mode  MD Ishimine informed of patient's arrival.  The pt does have a back up pump with them.    See  RN SHIFT ASSESSMENT ADULT  for full assessment, exceptions will be listed.    Cardiac rhythm:  ST, HR in the 100's    Oxygen Delivery: Nasal Cannula 3 L/Min    Filed Vitals:    02/20/13 2122 02/20/13 2131 02/20/13 2200 02/20/13 2214   BP:   149/70 149/70   Pulse: 103 100 100 100   Temp:  100.1 F (37.8 C)     Resp: 25 23 29     Height:       Weight:       SpO2: 95% 95% 93%        Lab Results   Component Value Date  WBC 7.7 02/20/2013    RBC 5.10 02/20/2013    HGB 13.2 02/20/2013    HCT 39.1 02/20/2013    MCV 76.7* 02/20/2013    MCHC 33.8 02/20/2013    RDW 16.1* 02/20/2013    PLT 125* 02/20/2013    MPV 10.2 02/20/2013       Lab Results   Component Value Date    NA 134* 02/20/2013    K 3.8 02/20/2013    CL 97* 02/20/2013    BICARB 24 02/20/2013    BUN 23* 02/20/2013    CREAT 1.19* 02/20/2013    GLU 138* 02/20/2013    [place] 11.0* 02/20/2013       No results found for this basename: BNP, PHOS, MG, LACTATE, AMMONIA, IONCA, ARTIONCA       Lab Results   Component Value Date    CPK 36 02/20/2013    Ross 1.4 02/20/2013    TROPONIN <0.01 02/20/2013    TROPONIN 0.02 02/20/2013       No results found for this basename: PH, PCO2, O2CONTENT, IVHC3, IVBE, O2SAT, UNPH, UNPCO2, ARTPH, ARTPCO2, ARTO2CNT, IAHC3, IABE, ARTO2SAT, UNAPH, UNAPCO2       No results found for this visit on 02/20/13.    RADIOLOGIC STUDIES NOT COMPLETED: n/a  (none unless otherwise noted)    Active PICC Line / CVC Line / PIV Line / Drain / Airway / Intraosseous Line / Epidural Line / ART Line / Line Type / Wound    Name: Placement date: Placement time: Site: Days:    Peripheral IV - Left Antecubital 02/20/13  1900  Antecubital  less than 1            Any significant events and interventions with responses:  Please see narrative above.      Floor nurse informed that report is ready for review.  Opportunity to answer questions with floor RN face to face or by phone. ER number is [id number]. ER RN Vonzell Schlatter to be contacted for any questions.    Has this report been updated?  no  By Who:   Time:   Additional information by updated user:

## 2013-02-20 NOTE — ED Notes (Signed)
Drew 2nd set of blood cultures via 23g butterfly from [facility] and sent   Vitals stable and unchanged   Pt denies any complaints at this time   Husband remains at bedside for support   Currently on cardiac monitors with audible alarms, ST 103  02 sats 96% on 2L 02 via n.c.

## 2013-02-20 NOTE — Progress Notes (Signed)
Prostacyclin (Flolan/Remodulin)      Prostacyclin MCP 333.1    Attending physician name: Berton Bon MD    Is attending physician on pulmonary/critical service? No: Explanation for order patient currently in ED.    Prostacyclin competent team? yes    Accordance with Prostacyclin MCP 333.1?  yes    If the answer is No to any of above questions, contact your supervisor!    Prostacyclin dosing weight: 86 kg    Dose: 29 ng/kg/min    Concentration:  45,000 ng/mL    Rate: 29 (ng/kg/min) = 80 (ml/24hr)    Pump in date, 08/10/13. Spare in truck. Ice packs in truck. Current ice packs warm, replaced with ours and provided extras. Patient changes cassette at 8am, has spare/extra drug in truck. Cassettes not labeled but patient verbalized correct preparation and checked for bubbles in line and found none.

## 2013-02-20 NOTE — ED Notes (Addendum)
Pharmacy called, will be down shortly with dosing sheet.  Pulmonary HTN fellow paged.  Dr. Kyra Leyland and charge nurse Harley Alto, RN aware that pt is in ED.  Lastly, Nursing Supervisor paged regarding this pt as well.

## 2013-02-20 NOTE — ED Notes (Signed)
Pulmonary at the bedside to assess the pt.

## 2013-02-20 NOTE — ED Notes (Signed)
Repeat 12 Lead EKG was completed. Results was given to MD Ishimine.

## 2013-02-20 NOTE — ED Notes (Signed)
Pt given incentive spirometer   Vitals stable and unchanged   Updated pt on pending bed assignment but to go upstairs once the bed is clean   Pt verbalized understanding   Husband remains at bedside for support

## 2013-02-20 NOTE — ED Notes (Signed)
Report given to Brianna Silva, RN who is now assuming pt's care.   Care relinquished at this time.

## 2013-02-20 NOTE — H&P (Signed)
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 Patient states having URTI symptoms started 5 days back. Last 2 days, she started having chest pain that is retrosternal and in between shoulder blades. Pain gets worse with exertion and deep breathing. Associated with SOB, dry cough and fever ( as high as 101.2 ). Denied weight gain or LE edema.      Review of Systems:   A complete review of systems was performed and the pertinent positives and negatives were mentioned in the HPI, all other review of systems were negative.     Allergies:  Patient is allergic to allopurinol; levaquin; ofloxacin; and sulfa drugs.    Current Medications:    ALDACTONE 50 MG OR TABS one tab twice daily   Cholecalciferol 2000 UNITS CAPS Take 1 capsule by mouth daily.   digoxin (LANOXIN) 0.25 MG tablet Take 250 mcg by mouth daily.   Estradiol (DIVIGEL) 0.25 MG/0.25GM GEL Apply 0.25 mg topically nightly.   estradiol (ESTRING) 2 MG vaginal ring Insert 1 Ring vaginally Every 3 months. Follow package directions   FLOLAN 1.5 MG IV SOLR 81OF/BP/ZWC   FOLIC ACID 585 MCG OR TABS 1 TABLET DAILY   furosemide (LASIX) 20 MG tablet Take 3 tablets by mouth once as needed (edema).   furosemide (LASIX) 20 MG tablet Take 3 tablets by mouth 2 times daily.   medroxyPROGESTERone (PROVERA) 2.5 MG tablet Take 1.25 mg by mouth daily.   omega-3 fatty acids, OTC, 1000 MG capsule Take 1 g by mouth daily.   potassium chloride (KLOR-CON M20) 20 MEQ tablet Take 1 tablet by mouth 3 times daily.   sildenafil (REVATIO) 20 MG tablet Take 4 tablets by mouth 3 times daily. Take 4 tablets three times daily   warfarin (COUMADIN) 5 MG  tablet Take 1 tablet by mouth daily. 5mg  x 5days a week 2.5mg  2 days/wk       Past Medical History:  Patient  has a past medical history of Sjoegren syndrome (07/12/2009) and Pulmonary HTN. She also has no past medical history of Unspecified asthma(493.90) or CHRONIC AIRWAY OBSTRUCTION NEC.    Past Surgical History:  The patient's  has no past surgical history on file.    Social History:  The patient's  reports that she quit smoking about 35 years ago. She has never used smokeless tobacco. She reports that she does not drink alcohol.    Family History:  Family History   Problem Relation Age of Onset             Physical Exam:  BP 149/70    Pulse 100    Temp(Src) 100.1 F (37.8 C)    Resp 29    Ht 5\' 2"  (1.575 m)    Wt 72.661 kg (160 lb 3 oz)    BMI 29.29 kg/m2      SpO2 93%      General: well appearing mild respiratory distress.  Eyes:  PERRL,  Conjunctivae are normal  Oropharynx: without any lesions,    Neck:  Neck supple. No cervical or supraclavicular lymphadenopahty.  No JVD  Heart:  Regular rate and rhythm, normal S1 S2, no murmurs.  Lungs: Bilateral inspiratory crackles in lower and middle lung fields  Abdomen: BS normal.  Abdomen soft, non-tender.  No masses or organomegaly.  Extremities:  no cyanosis, clubbing, or edema.  Skin:  No rashes or lesions.  Neuro: Sensation and strength grossly normal.      Review of Laboratory Data:   Lab Results   Component Value Date    WBC 7.7 02/20/2013    HGB 13.2 02/20/2013    HCT 39.1 02/20/2013    PLT 125 02/20/2013    PLT 179 09/12/2008     Lab Results   Component Value Date    NA 134 02/20/2013    K 3.8 02/20/2013    CL 97 02/20/2013    BICARB 24 02/20/2013    BUN 23 02/20/2013    CREAT 1.19 02/20/2013    GLU 138 02/20/2013    [place] 11.0 02/20/2013     Lab Results   Component Value Date    PT 21.0 02/20/2013    PTT 39.1 02/20/2013    INR 2.0 02/20/2013      BNP: 3671    Trop: 0.02, then 0.01    Review of Radiology Studies: CXR: 1. Redemonstrated bilateral interstitial markings with increased  retrocardiac opacification which may be secondary to crowding as a result of decreased inspiratory effort compared to prior examination. However, pneumonic process cannot entirely be excluded.  2. Prominent pulmonary arteries consistent with prior history of pulmonary   arterial hypertension.     Impression and Plan: 63 year old female with history group 1 PAH who presentes with chest pain and respiratory symptoms that is likely related to pneumonia. Right sided heart failure exacerbation is also possible given elevated BNP and lung exam. Trops are borderline normal with no EKG changes to suggest ACS.    -Admit to hospital for pneumonia, role out ACS and possible heart failure  -Start moxifloxacin, follow blood cultures  -Repeat PA/Lat CXR tomorrow  -IV lasix 40 mg X 1  -Continue flolan current dose(29ng/kg/min), Revatio, lasix, aldactone and warfarin  -Telemetry  -Repeat trops  -Diet  -Full code    Ardine Eng, MD  Pulmonary and Critical Care Fellow  Pager: [phone]

## 2013-02-20 NOTE — ED Notes (Signed)
12 Lead EKG was completed. Previous 12 Lead EKG was found in the system/ chart to compare against new results. Results of old/ new 12 Lead EKG was given to MD Ishimine for further analysis. Copy was placed in chart.

## 2013-02-20 NOTE — ED Notes (Signed)
Report to provided to nancy rn on 4b  Awaiting transport to floor

## 2013-02-20 NOTE — ED Notes (Signed)
The patient is on a flolan CADD pump.   Pump is "RUN" mode upon arrival.  [place] volume is: 61.5 mls  Given: 37.5 mls  The pump delivers in 24 hours is : 80 ml per 24 hrs  The dosage in nanograms/per/kg is: 29  Medication was last changed 8am today. Due to be changed 8am tomorrow.  How many bottles used to mix Flolan: 3  The bottle colors are: red  Pt's dosing weight is: 86kg  Pharmacy called  Harley Alto, RN Charge RN informed.  Verified settings and that pump is in RUN mode  MD Ishimine informed of patient's arrival.  The pt does have a back up pump with them.  Husband present at the bedside.

## 2013-02-21 DIAGNOSIS — R0602 Shortness of breath: Secondary | ICD-10-CM

## 2013-02-21 DIAGNOSIS — R079 Chest pain, unspecified: Secondary | ICD-10-CM

## 2013-02-21 LAB — HEMOGRAM, BLOOD
Hct: 38.3 % (ref 34.0–45.0)
Hgb: 12.2 gm/dL (ref 11.2–15.7)
MCH: 24.6 pg — ABNORMAL LOW (ref 26.0–32.0)
MCHC: 31.9 % — ABNORMAL LOW (ref 32.0–36.0)
MCV: 77.2 um3 — ABNORMAL LOW (ref 79.0–95.0)
MPV: 10.6 fL (ref 9.4–12.4)
Plt Count: 114 10*3/uL — ABNORMAL LOW (ref 140–370)
RBC: 4.96 10*6/uL (ref 3.90–5.20)
RDW: 16.2 % — ABNORMAL HIGH (ref 12.0–14.0)
WBC: 7.9 10*3/uL (ref 4.0–10.0)

## 2013-02-21 LAB — INFLUENZA A/B PANEL (CALM LAB)
Influenza A, Rapid: NEGATIVE
Influenza B, Rapid: NEGATIVE

## 2013-02-21 LAB — ECG 12-LEAD
ATRIAL RATE: 105 {beats}/min
ATRIAL RATE: 101 {beats}/min
P AXIS: 35 degrees
P AXIS: 35 degrees
PR INTERVAL: 260 ms
PR INTERVAL: 242 ms
QRS INTERVAL/DURATION: 82 ms
QRS INTERVAL/DURATION: 94 ms
QT: 296 ms
QT: 322 ms
QTC INTERVAL: 391 ms
QTC INTERVAL: 417 ms
R AXIS: 110 degrees
R AXIS: 116 degrees
T AXIS: -7 degrees
T AXIS: -9 degrees
VENTRICULAR RATE: 105 {beats}/min
VENTRICULAR RATE: 101 {beats}/min

## 2013-02-21 LAB — INFLUENZA/RSV PCR PANEL
Influenza A, PCR: NEGATIVE
Influenza A, PCR: NEGATIVE
Influenza B, PCR: NEGATIVE
Influenza B, PCR: NEGATIVE
Respiratory Syncytial Virus PCR: NEGATIVE
Respiratory Syncytial Virus PCR: NEGATIVE

## 2013-02-21 LAB — RESPIRATORY PATHOGEN NUCLEIC ACID DETECTION TEST

## 2013-02-21 LAB — DIGOXIN, BLOOD: Digoxin: 2.1 ng/mL — ABNORMAL HIGH (ref 0.9–2.0)

## 2013-02-21 LAB — TROPONIN T, BLOOD: Troponin T: 0.03 ng/mL (ref <0.01)

## 2013-02-21 MED ORDER — SILDENAFIL CITRATE 20 MG OR TABS
80.0000 mg | ORAL_TABLET | Freq: Three times a day (TID) | ORAL | Status: DC
Start: 2013-02-21 — End: 2013-02-21

## 2013-02-21 MED ORDER — POTASSIUM CHLORIDE CRYS CR 10 MEQ OR TBCR
20.0000 meq | EXTENDED_RELEASE_TABLET | Freq: Three times a day (TID) | ORAL | Status: DC
Start: 2013-02-21 — End: 2013-02-22
  Administered 2013-02-21 – 2013-02-22 (×5): 20 meq via ORAL
  Filled 2013-02-21 (×5): qty 2

## 2013-02-21 MED ORDER — SILDENAFIL CITRATE 20 MG OR TABS
80.0000 mg | ORAL_TABLET | Freq: Three times a day (TID) | ORAL | Status: DC
Start: 2013-02-21 — End: 2013-02-22
  Administered 2013-02-21 – 2013-02-22 (×4): 80 mg via ORAL
  Filled 2013-02-21 (×7): qty 4

## 2013-02-21 MED ORDER — GLYCINE DILUENT IV SOLN
29.0000 ng/kg/min | INTRAVENOUS | Status: DC
Start: 2013-02-21 — End: 2013-02-22
  Administered 2013-02-21 – 2013-02-22 (×4): 29 ng/kg/min via INTRAVENOUS
  Filled 2013-02-21 (×4): qty 4.5

## 2013-02-21 MED ORDER — PROSTACYCLIN ICE PACK (~~LOC~~)
Freq: Four times a day (QID) | Status: DC
Start: 2013-02-21 — End: 2013-02-22
  Administered 2013-02-21 (×2)
  Administered 2013-02-21 – 2013-02-22 (×3): 2
  Administered 2013-02-22 (×2)
  Filled 2013-02-21: qty 1

## 2013-02-21 MED ORDER — FUROSEMIDE 40 MG OR TABS
60.0000 mg | ORAL_TABLET | Freq: Two times a day (BID) | ORAL | Status: DC
Start: 2013-02-21 — End: 2013-02-22
  Administered 2013-02-21 – 2013-02-22 (×3): 60 mg via ORAL
  Filled 2013-02-21 (×3): qty 1

## 2013-02-21 MED ORDER — VITAMIN D 1000 UNIT OR TABS
2000.0000 [IU] | ORAL_TABLET | Freq: Every day | ORAL | Status: DC
Start: 2013-02-21 — End: 2013-02-22
  Administered 2013-02-21 – 2013-02-22 (×2): 2000 [IU] via ORAL
  Filled 2013-02-21 (×2): qty 2

## 2013-02-21 MED ORDER — AZITHROMYCIN 250 MG OR TABS
500.0000 mg | ORAL_TABLET | Freq: Every day | ORAL | Status: DC
Start: 2013-02-21 — End: 2013-02-22
  Administered 2013-02-21 – 2013-02-22 (×2): 500 mg via ORAL
  Filled 2013-02-21 (×2): qty 2

## 2013-02-21 MED ORDER — PROSTACYCLIN CASSETTE CHANGE (~~LOC~~)
Freq: Every day | Status: DC
Start: 2013-02-21 — End: 2013-02-22
  Administered 2013-02-21: 1
  Administered 2013-02-22: 14:00:00
  Filled 2013-02-21: qty 1

## 2013-02-21 MED ORDER — SODIUM CHLORIDE 0.9 % IV SOLN
1000.0000 mg | INTRAVENOUS | Status: DC
Start: 2013-02-21 — End: 2013-02-22
  Administered 2013-02-21 – 2013-02-22 (×2): 1000 mg via INTRAVENOUS
  Filled 2013-02-21 (×2): qty 1000

## 2013-02-21 MED ORDER — FOLIC ACID 1 MG OR TABS
0.4000 mg | ORAL_TABLET | Freq: Every day | ORAL | Status: DC
Start: 2013-02-21 — End: 2013-02-22
  Administered 2013-02-21 – 2013-02-22 (×2): 0.5 mg via ORAL
  Filled 2013-02-21 (×2): qty 1

## 2013-02-21 MED ORDER — SPIRONOLACTONE 25 MG OR TABS
50.0000 mg | ORAL_TABLET | Freq: Two times a day (BID) | ORAL | Status: DC
Start: 2013-02-21 — End: 2013-02-22
  Administered 2013-02-21 – 2013-02-22 (×3): 50 mg via ORAL
  Filled 2013-02-21 (×3): qty 2

## 2013-02-21 MED ORDER — FOLIC ACID 400 MCG OR TABS
0.4000 mg | ORAL_TABLET | Freq: Every day | ORAL | Status: DC
Start: 2013-02-21 — End: 2013-02-21
  Filled 2013-02-21 (×2): qty 1

## 2013-02-21 MED ORDER — PROSTACYCLIN BATTERIES - PUMP (~~LOC~~)
Status: DC
Start: 2013-02-21 — End: 2013-02-22
  Administered 2013-02-21: 4
  Filled 2013-02-21: qty 1

## 2013-02-21 MED ORDER — WARFARIN SODIUM 5 MG OR TABS
5.0000 mg | ORAL_TABLET | Freq: Every day | ORAL | Status: DC
Start: 2013-02-21 — End: 2013-02-21

## 2013-02-21 MED ORDER — PROSTACYCLIN TUBING (~~LOC~~)
Status: DC
Start: 2013-02-21 — End: 2013-02-22
  Administered 2013-02-21: 1
  Filled 2013-02-21: qty 1

## 2013-02-21 MED ORDER — WARFARIN SODIUM 2.5 MG OR TABS
2.5000 mg | ORAL_TABLET | ORAL | Status: DC
Start: 2013-02-22 — End: 2013-02-22

## 2013-02-21 MED ORDER — DIGOXIN 0.125 MG OR TABS
250.0000 ug | ORAL_TABLET | Freq: Every day | ORAL | Status: DC
Start: 2013-02-21 — End: 2013-02-22
  Administered 2013-02-21 – 2013-02-22 (×2): 250 ug via ORAL
  Filled 2013-02-21 (×2): qty 2

## 2013-02-21 MED ORDER — WARFARIN SODIUM 5 MG OR TABS
5.0000 mg | ORAL_TABLET | ORAL | Status: DC
Start: 2013-02-21 — End: 2013-02-22
  Administered 2013-02-21: 5 mg via ORAL
  Filled 2013-02-21: qty 1

## 2013-02-21 NOTE — Plan of Care (Signed)
Problem: Airway Clearance - Ineffective  Goal: Cough is effective and produces thin mucus  Outcome: Met  Pt on 3L, pt reports cough.  No respiratory distress noted at this time.  Crackles in bilateral lower bases.      Problem: Bleeding - Risk of  Goal: Absence of active bleeding  Outcome: Met  No active bleeding at this time.  INR 2.      Problem: Breathing Pattern - Ineffective  Goal: Respiratory rate, rhythm and depth return to baseline  Outcome: Met  Pt on 3L nc, sat 96%, crackles in lower bases.  Will continue to monitor pt.    Problem: Tissue Perfusion - Cardiopulmonary, Altered  Goal: Hemodynamic stability  Outcome: Met  Pt on telemetry monitoring, 1 st degree AV block hr 90's.  Bp 136/66.  Will continue to monitor pt.    Problem: Discharge Planning  Goal: Participation in care planning  Outcome: Met  Pt is cooperative and willing to participate in own health management in hospital.  Pt family also very supportive and partake in pt care, encouraging pt to become more independent.  No further orders for d/c order by MD at this time.     Problem: Falls - Risk of  Goal: Absence of falls  Outcome: Met  Pt free from falls, fall risk assessed, environmental safety assessed, pt uses call light appropriately for assistance, bed on low, bed alarm is on and fall identifiers are in place. Will continue to monitor pt and reassess.    Problem: Infection  Goal: Absence of infection signs and symptoms  Outcome: Met  Pt temp 100 F, wbc wnl 7.7.  Will continue to monitor pt.    Problem: Pain - Acute  Goal: Communication of presence of pain  Outcome: Met  Pt able to use numerical pain scale to indicate level of pain.  Pain treated and pt is assessed Q 4 hrs and prn.    Problem: Thromboemboli, Risk of  Goal: Absence of signs and symptoms of Thromboemboli  Outcome: Met  No signs of thromboemboli at this time.  Will continue to monitor pt.

## 2013-02-21 NOTE — Plan of Care (Signed)
Problem: Airway Clearance - Ineffective  Goal: Lungs are clear to auscultation  If hypoventilation is present, implement Breathing Pattern - Ineffective, plan of care.   Outcome: Not Met  Crackles heard in bilateral lower lobes.  Remains on 3 L NC.  Non productive cough noted.     Problem: Bleeding - Risk of  Goal: Absence of active bleeding  Outcome: Met  Assessed for signs and symptoms of bleeding and instructed patient to promptly report any signs/symptoms of bleeding.   Vital signs stable at this time.   Patient without signs and symptoms of bleeding.  Lab Results   Component Value Date     WBC 7.9 02/21/2013     RBC 4.96 02/21/2013     HGB 12.2 02/21/2013     HCT 38.3 02/21/2013     MCV 77.2 02/21/2013     MCHC 31.9 02/21/2013     RDW 16.2 02/21/2013     PLT 114 02/21/2013     PLT 179 09/12/2008     MPV 10.6 02/21/2013           Problem: Breathing Pattern - Ineffective  Goal: Respiratory rate, rhythm and depth return to baseline  Outcome: Not Met  Monitored breathing and lung sounds. Patient instructed to report difficulty breathing/shortness of breath. Increased shortness of breath noted with minimal exertion. See assessment for pulmonary details.  No acute respiratory distress noted.  Flolan administrated and monitored per protocol.  Additional CADD pump at bedside at all times. Flolan this infusing without interruption to left broviac. Will continue to monitor, intervene as necessary, and update care plan as needed.     Problem: Tissue Perfusion - Cardiopulmonary, Altered  Goal: Hemodynamic stability  Outcome: Met  SR to ST on cardiac monitor. Peripheral pulses palpable with trace edema noted noted to BLE.  Patient denies chest pain, pressure and palpitations. Will continue to monitor and assess for changes.  Temperature:  [99 F (37.2 C)-100.4 F (38 C)] 99 F (37.2 C) (02/09 1116)  Blood pressure (BP): (133-159)/(58-79) 133/68 mmHg (02/09 1116)  Heart Rate:  [91-110] 91 (02/09 1116)  Respirations:  [16-30] 18 (02/09  1116)  Pain Score:  [-] 2 (02/09 1116)  O2 Device:  [-] Nasal cannula (02/09 1116)  O2 Flow Rate (L/min):  [3 l/min] 3 l/min (02/09 1116)  SpO2:  [93 %-98 %] 98 % (02/09 1116)        Problem: Discharge Planning  Goal: Participation in care planning  Outcome: Met  Patient participates in POC and discharge planning.  Encouraged to ask questions and voice concerns.    Problem: Falls - Risk of  Goal: Absence of falls  Outcome: Met  Assessed and monitored patients safety, bedrails up x2 while in bed, bed in lowest and locked position, call light and belongings within reach, non-skid footwear on, instructed patient  to call for assistance.  No incident of fall or injury noted.  Will continue to monitor.      Problem: Infection  Goal: Absence of infection signs and symptoms  Outcome: Not Met  Still having low grade temp.  Receiving antibiotics as ordered (see MAR).    Problem: Pain - Acute  Goal: Communication of presence of pain  Outcome: Met  Discussed pain management with patient.  Monitored patient for effectiveness of pain management. Instructed patient to report pain early for intervention. Patient verbalizes understanding of pain management and role in reporting pain. Intervention applied as needed during shift (see MAR for medication administration). Patient  has reported effective pain management during shift.     Problem: Thromboemboli, Risk of  Goal: Absence of signs and symptoms of Thromboemboli  Outcome: Met  No s/s of thromboemboli noted.  Receiving anticoagulants as ordered (see MAR).

## 2013-02-21 NOTE — Progress Notes (Addendum)
Pulmonary Hypertension Fellow Note  LOS:  LOS: 1 day     Subjective:  No overnight events. Feels okay. Coughed up some white phlegm this AM but otherwise non productive.     Physical Examination:  BP 140/58    Pulse 93    Temp(Src) 100.2 F (37.9 C)    Resp 25    Ht 5\' 2"  (1.575 m)    Wt 71.3 kg (157 lb 3 oz)    BMI 28.74 kg/m2      SpO2 96%  3lpm  Tm 100.34F  I/O: -460  General: 63 year old female, NAD.   HEENT: PERRL, No accessory muscle use. Facial plethora.  Heart: Normal S1, Loud S2, + systolic murmur  Pulm: Decreased BS at L base w/ some crackles.   Abdomen: Soft, NABS  Extr: No edema.    Meds:  Epo 29  Azithro  Ceftriaxone  Digoxin  Folic acid  Lasix 60 BID  Sildenafil 80mg  TID  Aldactone 50mg  BID  Warfarin 5/5/2.5/5/2.5/5/5    Data:  WBC 7.7 PC 125  INR 2.0  BNP 3671 <- 1137  CEs neg   Flu Neg x2,  BCx2 pending    IMPRESSION AND PLAN: Diana Chambers is a 63 year old female with Group 1 PAH d/t CTD admitted w/ fever/URI sxs:    1. CAP: Flu neg, BC pending.   - On azithro/ceftriaxone (patient declined Moxifloxacin d/t previous side effects from FQs)  - Check Sputum cx, RPNAAT  - f/u BC x2     2. PAH: Group 1, NYHA 3 d/t CTD  - Cont Flolan at 29 ng/kg/min.   - Cont Revatio (patient wants to take her own home med), Lasix and Aldactone.   - No new EKG changes, CEs wnl.     Discussed with Dr. [name].     Dianna Limbo, MD  Pulmonary Critical Care Fellow  Pager: [phone]   PID: [id number]    Pulmonary Vascular Attending Addendum:  Patient was examined with Dr. Woody Seller.  Labs, imaging and tests reviewed.  Please see above note for full details and plan.    63 yo F with WHO I PAH associated with CTD admitted with fever and cough.  Most concerning for viral or atypical respiratory infection.  Blood cultures pending to rule out line infxn.  -Continue home PAH regimen  -Continue ceftriaxone/azithro pending culture results  -Additional lasix as needed PRN    [name] MD  Pager [phone]

## 2013-02-21 NOTE — Progress Notes (Cosign Needed)
Pharmacist Admission Medication Reconciliation    Diana Chambers  MRN: [id number]  [id number]    Date performed: 02/21/2013    Admit medication reconciliation performed by pharmacist.  Any findings identified discussed with physician and reflected in [place].    Information obtained from:   Patient and PTA med list    Findings and recommendations:   PTA list is complete.   Patient is asking if she can be started on medroxyprogestone and Estradiol (Divigel- she wants brand name only- wondering if she can use her own supply).   She doesn't need Estring until March. (use every 3 months).      Adherence: patient report great adherence.    Refills Needed: none.    Patient contact: [phone] (H)    Pharmacy for discharge: Patient prefers a hand written Rx so that she may go to any pharmacy she wants.  Outpatient Pharmacy: Patient prefers a hand written Rx so that she may go to any pharmacy she wants.      Anticoagulation follow-up:  At an [facility] clinic in [place] ,[place]. She usually check once a month.   Warfarin Education:  To be done at discharge.     Time spent: 20 mins     [place]

## 2013-02-21 NOTE — Interdisciplinary (Signed)
02/21/13 1115   Patient Information   Why is Patient in the Hospital? 63 y/o female admitted for chest pain   Prior to Level of Function Ambulatory/Independent with ADL's   Income Information   Income Source Other   Income/Expense Information Income meets expenses   Referral To   Financial Resources Medicare   Discharge Planning   Living Arrangements Spouse / significant other  (husband, [name], [phone])   [place] Family member(s)   Type of Residence Private residence  (house, no stairs)   [place] Yes   Type of [place] Nurse visit   Patient expects to be discharged to: home   Do you have transportation home?  Yes   Social Worker Consult   Do you need to see a Education officer, museum?  No   Pt lives in [place] with her husband, [name], in a house, no stairs. She is able to ambulate independently. Pt uses home O2,2l/m, has an [employer] pump with her. She is also on home Flolan Gtt, receives her medications from [place]. CM to continue to f/u for any dc needs.

## 2013-02-21 NOTE — Progress Notes (Incomplete Revision)
Pharmacist Admission Medication Reconciliation    Diana Chambers  MRN: [id number]  [id number]    Date performed: 02/21/2013    Admit medication reconciliation performed by pharmacist.  Any findings identified discussed with physician and reflected in EPIC.    Information obtained from:   Patient and PTA med list    Findings and recommendations:   PTA list is complete.   Patient is asking if she can be started on medroxyprogestone and Estradiol (Divigel- she wants brand name only- wondering if she can use her own supply).   She doesn't need Estring until March. (use every 3 months).      Adherence: patient report great adherence.    Refills Needed: none.    Patient contact: [phone] (H)    Pharmacy for discharge: Patient prefers a hand written Rx so that she may go to any pharmacy she wants.  Outpatient Pharmacy: Patient prefers a hand written Rx so that she may go to any pharmacy she wants.      Anticoagulation follow-up:  At an anticoag clinic in [place] ,[place]. She usually check once a month.   Warfarin Education:  To be done at discharge.     Time spent: 20 mins     EiThandar Win  Pharmacy Student  [phone]

## 2013-02-22 ENCOUNTER — Encounter (HOSPITAL_COMMUNITY): Payer: Self-pay | Admitting: Pulmonary Medicine

## 2013-02-22 DIAGNOSIS — R0609 Other forms of dyspnea: Secondary | ICD-10-CM

## 2013-02-22 DIAGNOSIS — J40 Bronchitis, not specified as acute or chronic: Principal | ICD-10-CM

## 2013-02-22 DIAGNOSIS — R0989 Other specified symptoms and signs involving the circulatory and respiratory systems: Secondary | ICD-10-CM

## 2013-02-22 DIAGNOSIS — J189 Pneumonia, unspecified organism: Principal | ICD-10-CM

## 2013-02-22 DIAGNOSIS — I509 Heart failure, unspecified: Secondary | ICD-10-CM

## 2013-02-22 DIAGNOSIS — I270 Primary pulmonary hypertension: Secondary | ICD-10-CM

## 2013-02-22 LAB — BASIC METABOLIC PANEL, BLOOD
BUN: 28 mg/dL — ABNORMAL HIGH (ref 6–20)
Bicarbonate: 23 mmol/L (ref 22–29)
Calcium: 11.1 mg/dL — ABNORMAL HIGH (ref 8.5–10.6)
Chloride: 103 mmol/L (ref 98–107)
Creatinine: 1.1 mg/dL — ABNORMAL HIGH (ref 0.51–0.95)
GFR: 50 mL/min
Glucose: 93 mg/dL (ref 70–115)
Potassium: 4.1 mmol/L (ref 3.5–5.1)
Sodium: 137 mmol/L (ref 136–145)

## 2013-02-22 LAB — CBC WITH DIFF, BLOOD
ANC-Manual Mode: 4.2 10*3/uL (ref 1.6–7.0)
Abs Eosinophils: 0.1 10*3/uL (ref 0.1–0.5)
Abs Lymphs: 1 10*3/uL (ref 0.8–3.1)
Abs Monos: 0.3 10*3/uL (ref 0.2–0.8)
Bands: 3 % (ref 0–15)
Eosinophils: 1 % (ref 1–4)
Hct: 35.6 % (ref 34.0–45.0)
Hgb: 11.7 gm/dL (ref 11.2–15.7)
Lymphocytes: 17 % — ABNORMAL LOW (ref 19–53)
MCH: 25.2 pg — ABNORMAL LOW (ref 26.0–32.0)
MCHC: 32.9 % (ref 32.0–36.0)
MCV: 76.6 um3 — ABNORMAL LOW (ref 79.0–95.0)
MPV: 10.2 fL (ref 9.4–12.4)
Monocytes: 6 % (ref 5–12)
Myelocytes: 1 %
Number of Cells Counted: 115
Plt Count: 124 10*3/uL — ABNORMAL LOW (ref 140–370)
Plt Est: DECREASED
RBC: 4.65 10*6/uL (ref 3.90–5.20)
RDW: 16 % — ABNORMAL HIGH (ref 12.0–14.0)
Segs: 72 % — ABNORMAL HIGH (ref 34–71)
WBC: 5.6 10*3/uL (ref 4.0–10.0)

## 2013-02-22 LAB — MRSA SURVEILLANCE CULTURE

## 2013-02-22 MED ORDER — FUROSEMIDE 10 MG/ML IJ SOLN
40.00 mg | Freq: Once | INTRAMUSCULAR | Status: AC
Start: 2013-02-22 — End: 2013-02-22
  Administered 2013-02-22: 40 mg via INTRAVENOUS
  Filled 2013-02-22: qty 4

## 2013-02-22 MED ORDER — AZITHROMYCIN 250 MG OR TABS
250.00 mg | ORAL_TABLET | Freq: Every day | ORAL | Status: AC
Start: 2013-02-22 — End: 2013-02-24

## 2013-02-22 MED ORDER — AZITHROMYCIN 250 MG OR TABS
250.0000 mg | ORAL_TABLET | Freq: Every day | ORAL | Status: DC
Start: 2013-02-22 — End: 2013-02-22

## 2013-02-22 MED FILL — AZITHROMYCIN TAB 250 MG: MG | 2 days supply | Qty: 2 | Fill #0

## 2013-02-22 NOTE — Discharge Instructions (Addendum)
You were admitted for pneumonia. Please take your antibiotics as indicated and contact us for any questions.   Heart failure patients can help themselves by following these guidelines:    Eat foods that are low salt, low fat, and heart healthy.  Remove the salt shaker from your table.  Try other seasonings to flavor your food.  Salt is also called sodium.  It acts like a sponge and makes the body hold water.  Eating too much salt can cause swelling in your stomach, legs, ankles and feet, and can also increase the water in your lungs.  Watch how much fluid you drink because this may force your heart to work harder.    Being more active is one of the best things you can do for your heart.  Follow your doctors orders about level of activity.  Take rest periods between activities, and STOP if you feel chest pain, get very short of breath, or get tired or dizzy.  Call your doctor if these symptoms dont go away.    Call your doctor if:   - You gain more than 3 pounds in 3 days, or 5 pounds in 5 days.   - You have new or worsening shortness of breath.  - You have shortness of breath at rest or after walking a short distance.  - You need 2 pillows or more when you sleep and/or have to sleep sitting up.  - You are feeling dizzy for a long time or feeling you are about to pass out.    - Your swelling is getting worse in stomach, legs, ankles or feet.    It is very important to weigh yourself each day at home.   - Weigh yourself at the same time each morning:  after you urinate, before you eat breakfast, and before you get dressed.  - Write down your daily weight.  Your Weight at [facility] Discharge   02/22/13 70.4 kg (155 lb 3.3 oz)       Heart failure can be controlled with medicines.  Your medications are listed on your After Visit Summary.  -Take your medicines every day at the right times.  - Do not skip doses of your medicines. If you are having side effects from the medicines, call your doctor immediately!  - Do not  run out of pills.  Make sure you have enough medicine for at least 2 weeks.  - Take your diuretic on an empty stomach.    It is important to keep all your follow-up appointments.  - Look at your After Visit Summary for dates and times of your appointments. Bring your  vital sign sheet with daily log and your medications with you to clinic.  - If you do not have an appointment, please call and schedule one with your doctor within 7 days after discharge from the hospital.     Please review the Caring for your Heart booklet which was given to you while you were in the hospital.  It gives you more information on diet, fluid intake, activity, and the importance of taking your medicines as prescribed.  It explains the importance of weighing yourself daily, and tells you what to do if heart failure symptoms worsen.  It reminds you to keep all your follow-up appointments.

## 2013-02-22 NOTE — Plan of Care (Signed)
Problem: Airway Clearance - Ineffective  Goal: Cough is effective and produces thin mucus  Outcome: Met  Pt on 3L, pt reports cough. No respiratory distress noted at this time. Crackles in bilateral lower bases.     Problem: Bleeding - Risk of  Goal: Absence of active bleeding  Outcome: Met  No active bleeding at this time. Will continue to monitor pt.     Problem: Breathing Pattern - Ineffective  Goal: Respiratory rate, rhythm and depth return to baseline  Outcome: Met  Pt on 3L nc, sat 96%, crackles in bilateral lobes. Will continue to monitor pt.    Problem: Tissue Perfusion - Cardiopulmonary, Altered  Goal: Hemodynamic stability  Outcome: Met  Pt on telemetry monitoring, 1 st degree AV block hr 80's.  Will continue to monitor pt.    Problem: Discharge Planning  Goal: Participation in care planning  Outcome: Met  Pt is cooperative and willing to participate in own health management in hospital. Pt family also very supportive and partake in pt care, encouraging pt to become more independent. No further orders for d/c order by MD at this time.     Problem: Falls - Risk of  Goal: Absence of falls  Outcome: Met  Pt free from falls, fall risk assessed, environmental safety assessed, pt uses call light appropriately for assistance, bed on low, bed alarm is on and fall identifiers are in place. Will continue to monitor pt and reassess.    Problem: Infection  Goal: Absence of infection signs and symptoms  Outcome: Met  Pt wbc wnl 7.9. Will continue to monitor pt.    Problem: Pain - Acute  Goal: Communication of presence of pain  Outcome: Met  Pt able to use numerical pain scale to indicate level of pain. Pain treated and pt is assessed Q 4 hrs and prn.    Problem: Thromboemboli, Risk of  Goal: Absence of signs and symptoms of Thromboemboli  Outcome: Met  No signs of thromboemboli at this time. Will continue to monitor pt.

## 2013-02-22 NOTE — Plan of Care (Signed)
Problem: Bleeding - Risk of  Goal: Absence of active bleeding  Outcome: Resolved Date Met:  02/22/13  Assessed for signs and symptoms of bleeding and instructed patient to promptly report any s/sx of bleeding.  Monitor vital signs and labs as ordered and report any abnormal results. Vital signs stable at this time.   Lab Results   Component Value Date     HGB 11.7 02/22/2013     HCT 35.6 02/22/2013     MCV 76.6 02/22/2013     PLT 124 02/22/2013     PLT 179 09/12/2008   Patient without signs and symptoms of bleeding at this time.  Will continue to monitor.        Problem: Breathing Pattern - Ineffective  Goal: Respiratory rate, rhythm and depth return to baseline  Outcome: Resolved Date Met:  02/22/13  Assessed respiratory status, monitored for signs and symptoms of hypoxia and monitor SaO2. Instructed patient to report any breathing difficulty including shortness of breath.  Encouraged use of incentive spirometer while awake. Lungs sounds are clear and some crackles heard on lower lobes. Breathing in unlabored at rest. O2 set at 3L via NC saturation ranges 94-95%  . Patient denies breathing difficulty at this time and is splinting chest when coughing. No acute respiratory distress noted.    Problem: Tissue Perfusion - Cardiopulmonary, Altered  Goal: Hemodynamic stability  Outcome: Resolved Date Met:  02/22/13  Assessed. Pt on tele monitoring. Instructed patient to report chest pain/palpitations/pressure. Pt heart rhythm shows SR-ST Blood pressure 126/51, pulse 85, temperature 97.8 F (36.6 C), resp. rate 18, height _0  (1.575 m), weight 70.4 kg (155 lb 3.3 oz), SpO2 95 %. Denies CP or palpitation during the shift. trace edema noted on LEs. Pt on Lasix PO, pulses palpable with good capillary refill. will continue to monitor pt.  Lab Results   Component Value Date     NA 137 02/22/2013     K 4.1 02/22/2013     CL 103 02/22/2013     BICARB 23 02/22/2013     BUN 28 02/22/2013     CREAT 1.10 02/22/2013     GLU 93 02/22/2013      [place] 11.1 02/22/2013             Problem: Discharge Planning  Goal: Participation in care planning  Outcome: Resolved Date Met:  02/22/13  Pt discharged to home today. Discharge instructions including home medications, signs and symptoms of infection, diet, activity, and follow-up appointments explained to patient and spouse. Pt verbalized understanding of discharge instructions and a copy of printed discharged instructions given to patient. IV access removed, No bleeding noted. No vaccines ordered to give. Extra Flolan cassette provided to pt. Pt left the hospital via wheelchair transport and accompanied by family. V/s stable.        Problem: Falls - Risk of  Goal: Absence of falls  Outcome: Resolved Date Met:  02/22/13  Assessed and monitored pts safety, bedrails x2 up while in bed, bed locked and in low position, call light and belongings within reach, needs attended, instructed to call for assistance.Pt uses call light as instructed. Pt was checked q1 hr and prn. Pt ambulatory and has steady gait. No incident of fall or injury at this time. will continue to monitor pts safety.    Problem: Infection  Goal: Absence of infection signs and symptoms  Outcome: Resolved Date Met:  02/22/13  Assess and monitor pt for s/s of infection Q4h and prn.  No signs of infection noted to wound site at this time. Pt afebrile during the shift. Peripheral IV remains free from drainage/redness.  Will continue to monitor and to update as needed. WBC/HGB/HCT/PLT:  5.6/11.7/35.6/124 (02/10 0545)          Problem: Pain - Acute  Goal: Communication of presence of pain  Outcome: Resolved Date Met:  02/22/13  Assessed pain using 0-10 pain scale, assessed pain every 4 hours and PRN, discussed pain management, encouraged pt to reposition for comfort. monitored resp rate and LOC.  A&Ox3, Pts pain level maintained at comfortable level, asleep, no resp distress noted, will continue  to monitor pt's pain level.        Problem: Thromboemboli, Risk  of  Goal: Absence of signs and symptoms of Thromboemboli  Outcome: Resolved Date Met:  02/22/13    Problem: Skin Integrity- Intact patient high risk for impaired skin integrity Braden scale less than or equal to 18  Goal: Skin remains intact  Outcome: Resolved Date Met:  02/22/13  Pts skin is flushed due to flolan med. No skin breakdown noted.

## 2013-02-23 ENCOUNTER — Ambulatory Visit (HOSPITAL_COMMUNITY): Payer: Medicare Other | Admitting: Pulmonary Medicine

## 2013-02-23 ENCOUNTER — Encounter (INDEPENDENT_AMBULATORY_CARE_PROVIDER_SITE_OTHER): Payer: Self-pay | Admitting: Rheumatology

## 2013-02-23 ENCOUNTER — Ambulatory Visit (INDEPENDENT_AMBULATORY_CARE_PROVIDER_SITE_OTHER): Payer: Medicare Other | Admitting: Rheumatology

## 2013-02-23 DIAGNOSIS — M329 Systemic lupus erythematosus, unspecified: Secondary | ICD-10-CM

## 2013-02-23 NOTE — Discharge Summary (Signed)
Patient Name:  Diana Chambers    Principal Diagnosis (required):  Pneumonia    Hospital Problem List (required):  There are no hospital problems to display for this patient.      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 Patient states having URTI symptoms started 5 days back. Last 2 days, she started having chest pain that is retrosternal and in between shoulder blades. Pain gets worse with exertion and deep breathing. Associated with SOB, dry cough and fever ( as high as 101.2 ). Denied weight gain or LE edema. "    Hospital Course by Problem (required):  1. Pneumonia: Patient was admitted with fever and URI sxs with suspected pneumonia. Blood and sputum cultures were negative. Flu/RPNAAT and Legionella Ag were negative. She was started on Azithro/Cefriaxone since she declined FQs d/t h/o GI upset/joint pain and was discharged on Azithro.   2. Pulmonary Hypertension: Stable during admission. No changes made to her medication regimen.     Tests Outstanding at Discharge Requiring Follow Up:  None    Discharge Condition (required):  Good.    Key Physical Exam Findings at Discharge:  No significant physical examination findings at the time of discharge.    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    Discharge Diet:  Regular.    Discharge Medications:     What To Do With Your Medications      CONTINUE taking these medications      Add'l Info    ALDACTONE 50 MG tablet   one tab twice daily   Generic drug:  spironolactone    Refills:  0       digoxin 0.25 MG tablet   Commonly known as:  LANOXIN   Take 250 mcg by mouth daily.    Refills:  0       DIVIGEL 0.25 MG/0.25GM Gel   Apply 0.25 mg topically nightly.   Generic drug:  Estradiol    Refills:  0       estradiol 2 MG vaginal ring   Commonly known as:  ESTRING   Insert 1 Ring vaginally Every 3 months. Follow package directions    Refills:  0       FLOLAN injection   29ng/kg/min   Generic drug:  epoprostenol    Refills:  0       folic acid 527 MCG tablet   Commonly known as:  FOLVITE   1 TABLET DAILY    Refills:  0       * furosemide 20 MG tablet   Commonly known as:  LASIX   Take 3 tablets by mouth 2 times daily.    Quantity:  180 tablet   Refills:  3       * furosemide 20 MG tablet   Commonly known as:  LASIX   Take 3 tablets by mouth once as needed (edema).    Quantity:  30 tablet   Refills:  3       medroxyPROGESTERone 2.5 MG tablet   Commonly known as:  PROVERA   Take 1.25 mg by mouth daily.    Refills:  0       potassium chloride 20 MEQ tablet   Commonly known as:  KLOR-CON M20   Take 1 tablet by mouth 3 times daily.    Quantity:  90 tablet   Refills:  6       sildenafil 20 MG tablet   Commonly known as:  REVATIO   - Take 4 tablets by mouth 3 times daily. Take 4 tablets three times daily    -     Quantity:  90 tablet   Refills:  5       vitamin D3 2000 UNITS capsule   Take 1 capsule by mouth daily.    Refills:  0       warfarin 5 MG tablet   Commonly known as:  COUMADIN   Take 1 tablet by mouth daily. 5mg  x 5days a week 2.5mg  2 days/wk    Quantity:  45 tablet   Refills:  5       * Notice:  This list has 2 medication(s) that are the same as other medications  prescribed for you. Read the directions carefully, and ask your doctor or other care provider to review them with you.      STOP taking these medications         omega-3 fatty acids (OTC) 1000 MG capsule         ASK your doctor about these medications      Add'l Info    azithromycin 250 MG tablet   Commonly known as:  ZITHROMAX   Take 1 tablet by mouth daily.   Ask about: Which instructions should I use?    Quantity:  2 tablet   Refills:  0         Where to Get Your Medications    You need to pick up these prescriptions. We sent them to a specific pharmacy, so go there to get them.          [facility]   -  azithromycin 250 MG tablet    [address] [place] [address]   [place] [address]   Phone:  [phone]                      Allergies:  Allergies   Allergen Reactions    Allopurinol Hives    Levaquin Swelling and Other     Severe joint pain      Ofloxacin Nausea and Vomiting    Chlorhexidine Itching    Sulfa Drugs Unspecified       Discharge Disposition:  Home.    Discharge Code Status:  Full code / full care  This code status is not changed from the time of admission.    Follow Up Appointments: With Dr. [name] as scheduled.    For appointments requested for after discharge that have not yet been scheduled, refer to the Post Discharge Referrals section of the After Visit Summary.    Discharging 61 Contact Information:  [facility] operator at [phone].

## 2013-02-23 NOTE — Progress Notes (Signed)
Rheumatology follow up note.    #1. Systemic Lupus Erythematosus    SUBJECTIVE:  Diana Chambers presents for follow up for management of SLE, ILD and PVH. She reports that she was hospitalized earlier this week for community acquired pneumonia with fever, pleuritic chest pain and a retrocardiac infiltrate superimposed on her chronic ILD changes. She responded quickly to antibiotics and was discharged yesterday to complete a course of azithromycin. She no longer has pleuritic pain and is breathing more comfortably.  She has had no flare of lupus dermatitis or arthritis. She is taking no medication for controlling her lupus.    OBJECTIVE:  Complete joint exam today shows no sign of inflammation anywhere. Her skin continues to have flolan induced erythema, but no sign of lupus dermatitis.    Laboratory tests done earlier this week were reviewed. Of note is normal CBC, stable mild thrombocytopenia, and creatinine.     A:&P: Her lupus appears to be remaining quiet. The pleuritic symptoms likely from the pneumonia and not lupus since it resolved quickly with antibiotics.  We will not add any medication for her lupus.    RV scheduled for 6 months.

## 2013-02-25 LAB — RESPIRATORY CULTURE W/GRAM STAIN: Respiratory Culture Result: NORMAL

## 2013-02-25 LAB — BLOOD CULTURE
Blood Culture Result: NO GROWTH
Blood Culture Result: NO GROWTH

## 2013-03-23 ENCOUNTER — Ambulatory Visit
Admission: RE | Admit: 2013-03-23 | Discharge: 2013-03-23 | Disposition: A | Discharge status: Home / Self Care | Payer: Medicare Other | Source: Ambulatory Visit | Attending: Pulmonary Medicine | Admitting: Pulmonary Medicine

## 2013-03-23 ENCOUNTER — Ambulatory Visit: Payer: Medicare Other | Attending: Pulmonary Medicine | Admitting: Pulmonary Medicine

## 2013-03-23 VITALS — BP 138/59 | HR 80 | Temp 98.0°F | Resp 18 | Ht 62.0 in | Wt 157.0 lb

## 2013-03-23 DIAGNOSIS — M1990 Unspecified osteoarthritis, unspecified site: Secondary | ICD-10-CM

## 2013-03-23 DIAGNOSIS — M129 Arthropathy, unspecified: Principal | ICD-10-CM | POA: Insufficient documentation

## 2013-03-23 DIAGNOSIS — R0609 Other forms of dyspnea: Principal | ICD-10-CM | POA: Insufficient documentation

## 2013-03-23 DIAGNOSIS — R0989 Other specified symptoms and signs involving the circulatory and respiratory systems: Principal | ICD-10-CM

## 2013-03-23 MED ORDER — PREDNISONE (PAK) 10 MG OR TABS
ORAL_TABLET | ORAL | Status: DC
Start: 2013-03-23 — End: 2016-02-26

## 2013-03-23 NOTE — Procedures (Signed)
No note

## 2013-03-28 NOTE — Progress Notes (Signed)
CLINIC: [facility]    REPORT TYPE: NOTE    Dictating Practitioner: Larna Daughters, M.D.    DATE OF SERVICE:  03/23/2013    REASON FOR VISIT: FOLLOWUP FOR PULMONARY ARTERIAL HYPERTENSION        HISTORY OF PRESENT ILLNESS: [name] is a 63 year old  female with a history of WHO Group 1 pulmonary arterial hypertension  associated with the presence of connective tissue disease who returns for  followup. She was recently seen in the hospital in February of 2015 with an  upper respiratory tract infection and now comes back for routine followup.  After discharge, she reports that her pulmonary symptoms were significantly  improved and it took a little while for her fatigue to improve; however,  she is now back to her previous baseline. No episodes of chest pains,  palpitations, syncope, presyncope, and her lower extremity edema is stable.  Abdominal distention is stable. No difficulties with her tunneled line. No  new changes to her medications.    REVIEW OF SYSTEMS: Negative, other than as specified in HPI.    ALLERGIES  1. ALLOPURINOL.  2. LEVAQUIN.  3. OFLOXACIN.  4. CHLORHEXIDINE.    CURRENT MEDICATIONS  1. Aldactone 50 mg p.o. b.i.d.  2. Calcium with vitamin D.  3. Digoxin 0.25 mg p.o. daily.  4. Estradiol topical cream.  5. Intravenous Veletri continuous infusion 29 ng/kg per minute.  6. Lasix 20-mg tablets 3 times daily as needed.  7. Potassium chloride 20 mEq p.o. 3 times daily.  8. Sildenafil 80 mg p.o. t.i.d.  9. Coumadin 5 mg five days a week and 2.5 mg two days a week.    PHYSICAL EXAMINATION  VITAL SIGNS: Blood pressure 138/59, pulse 80, respirations 18, temperature  98, weight 157 pounds, height 5 feet 2 inches, saturation 95% on 2 liters  nasal cannula.  GENERAL: Awake, alert, oriented, in no acute distress. Able to speak in  full sentences.  OROPHARYNX: Clear.  NECK: Relatively flat neck veins. No thyromegaly, no lymphadenopathy.  HEART: Regular rate and rhythm, loud second heart sound,  positive systolic  murmur. No S3 heard.  LUNGS: Faint rales at the bases bilaterally.  ABDOMEN: Soft, nondistended, nontender. Positive bowel sounds. No  organomegaly noted.  EXTREMITIES: No clubbing, cyanosis, or edema.    SIX-MINUTE WALK TEST, DATED 03/23/2013: Saturation at rest on 2 liters  nasal cannula 99%; saturation with exercise on 2 liters nasal cannula 90%.  Heart rate at rest 80; heart rate with exercise 139. Total distance walked  460 m.    ASSESSMENT: [name] is a 63 year old female with a history  of WHO Group 1 pulmonary arterial hypertension that is related to  connective tissue disease who has stable cardiopulmonary symptoms and  remains New York Heart Association functional class II/III.    PLAN: We will continue her on current therapy, including intravenous  epoprostenol and high-dose sildenafil. We will also continue her current  dose of diuretics. We will not make any changes to her PAH medications at  this time and will plan to see her back in clinic in approximately 4 to 6  months with a repeat 6-minute walk test.                        Electronically signed by:  Larna Daughters, M.D. 06/07/2013 08:34 P          DD: 03/28/2013    DT: 03/28/2013 02:23 P  DocNo.: [id number]  [id number]                 [id number]    Referring Physician:  [facility]          cc:

## 2013-03-28 NOTE — Progress Notes (Signed)
This office note has been dictated.

## 2013-09-02 IMAGING — CR DG WRIST COMPLETE 3+V*R*
1 series · 4 of 4 positions shown · non-contrast
Comparison: None

REASON FOR EXAM: fall,  pain
COMMENTS:   May transport without cardiac monitor

PROCEDURE:     DXR - DXR WRIST RT COMP WITH OBLIQUES  - April 28, 2011 [DATE]
RESULT:     History: Fall, pain

[Series 1: pa · 0.17mm/px · 4 of 4 slices shown]
[im 1/4]
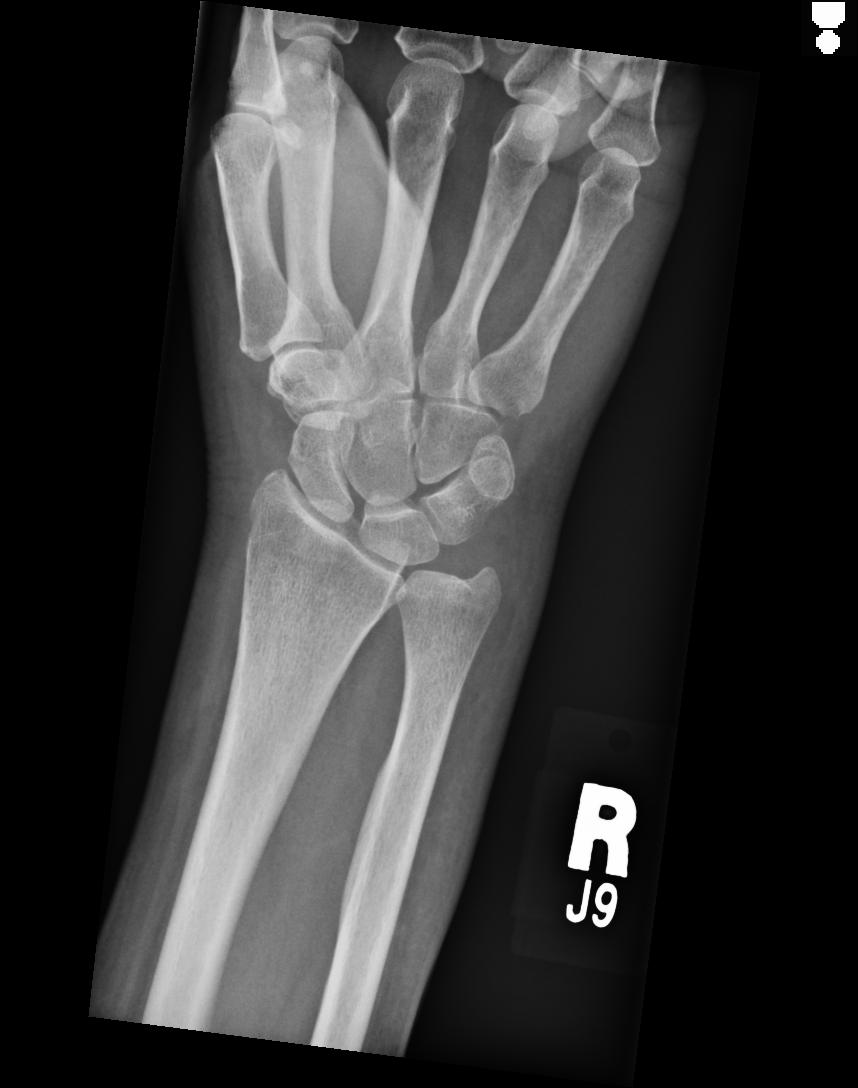
[im 2/4]
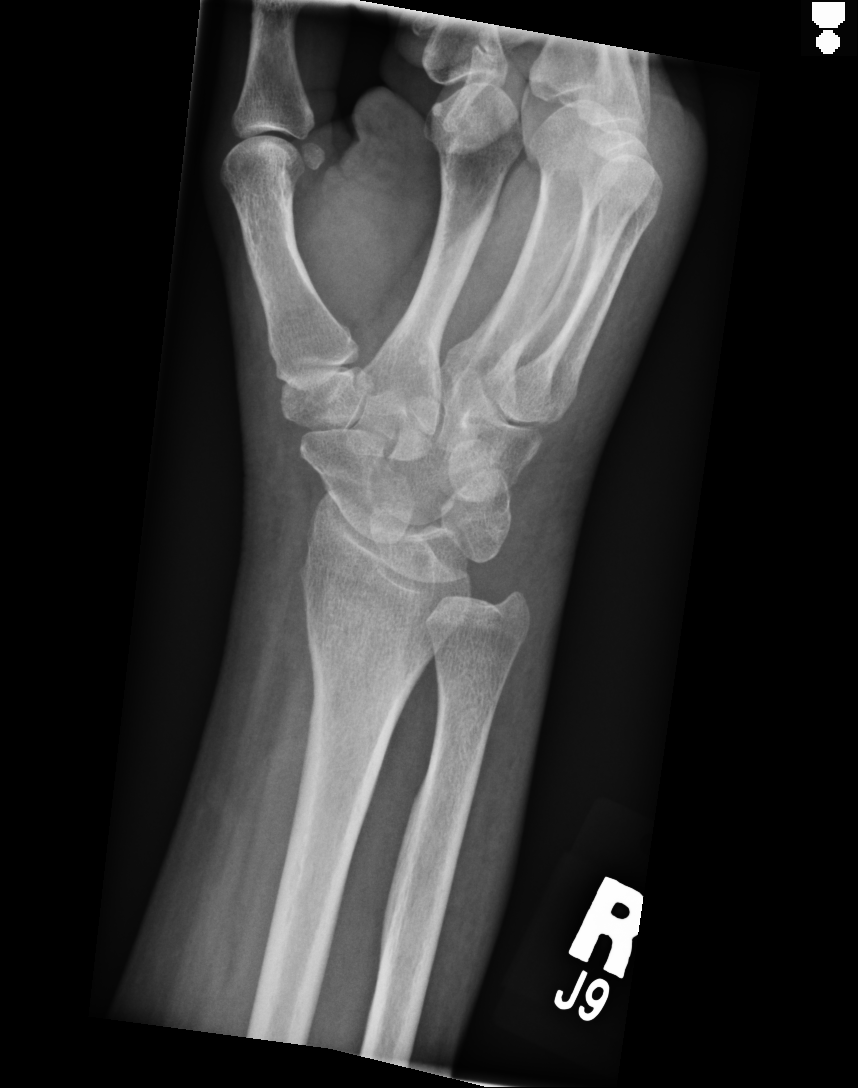
[im 3/4]
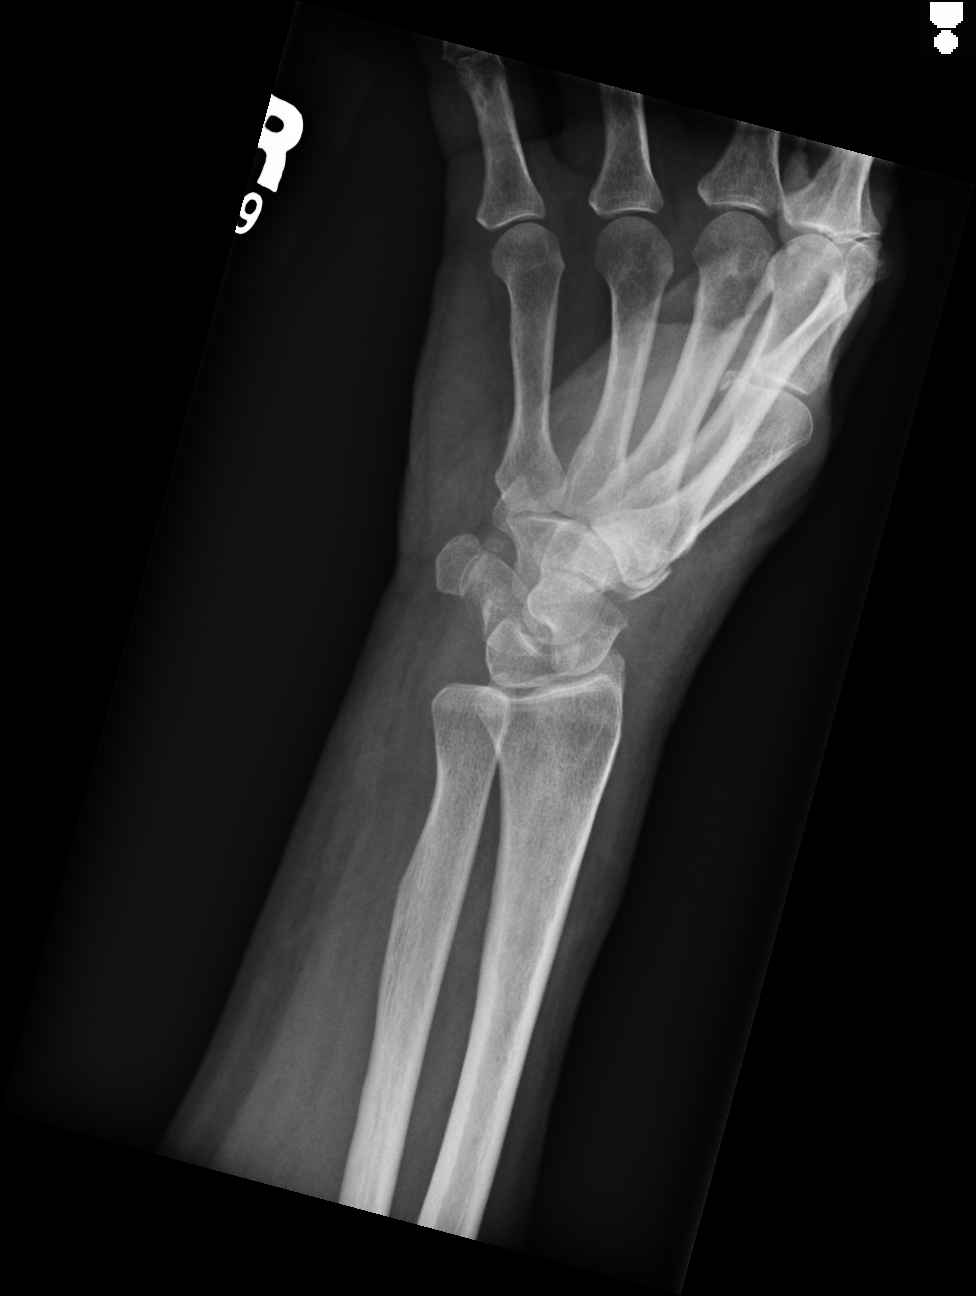
[im 4/4]
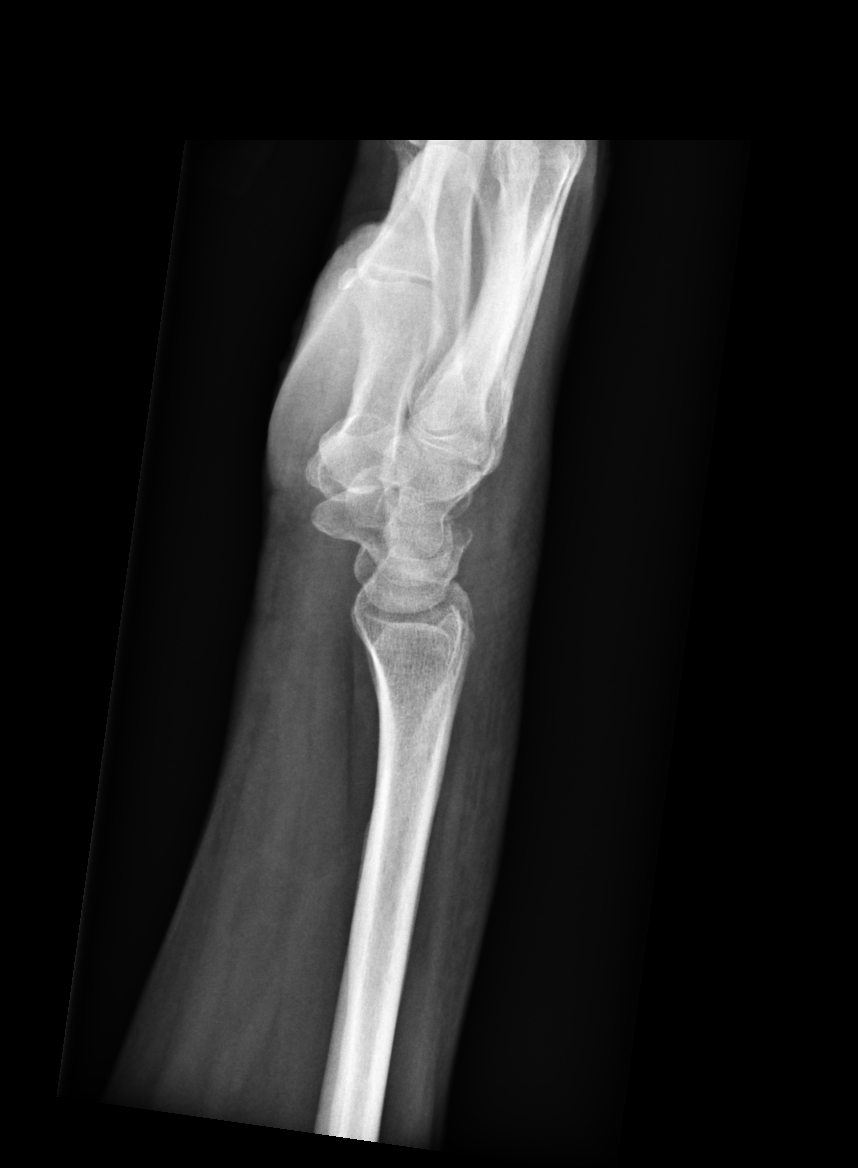

[4 of 4 positions shown; findings below may reference images not displayed]

FINDINGS: Four views of the right wrist demonstrates no acute fracture or dislocation.
The joint spaces are maintained. The soft tissues appear normal.
IMPRESSION: No acute osseous abnormality of the right wrist.

If there is clinical concern regarding a radiographically occult scaphoid
fracture, snuff box tenderness, or ligamentous injury, further assessment
with MRI is recommended.

## 2013-10-27 ENCOUNTER — Encounter (HOSPITAL_COMMUNITY): Payer: Self-pay | Admitting: Pulmonary Medicine

## 2013-10-27 DIAGNOSIS — R0609 Other forms of dyspnea: Principal | ICD-10-CM

## 2013-11-02 ENCOUNTER — Ambulatory Visit (HOSPITAL_BASED_OUTPATIENT_CLINIC_OR_DEPARTMENT_OTHER)
Admission: RE | Admit: 2013-11-02 | Discharge: 2013-11-03 | Disposition: A | Discharge status: Home Health | Payer: Medicare Other | Attending: Pulmonary Medicine | Admitting: Pulmonary Medicine

## 2013-11-02 ENCOUNTER — Ambulatory Visit: Payer: Medicare Other | Attending: Pulmonary Medicine | Admitting: Pulmonary Medicine

## 2013-11-02 ENCOUNTER — Encounter (HOSPITAL_COMMUNITY): Payer: Self-pay | Admitting: Pulmonary Medicine

## 2013-11-02 VITALS — BP 122/62 | HR 91 | Temp 98.8°F | Resp 20 | Ht 62.0 in | Wt 161.9 lb

## 2013-11-02 DIAGNOSIS — R0609 Other forms of dyspnea: Secondary | ICD-10-CM

## 2013-11-02 DIAGNOSIS — I270 Primary pulmonary hypertension: Principal | ICD-10-CM | POA: Insufficient documentation

## 2013-11-02 NOTE — Procedures (Signed)
No note

## 2013-11-03 NOTE — Progress Notes (Signed)
This office note has been dictated.

## 2013-11-03 NOTE — Progress Notes (Unsigned)
CLINIC: [facility]    REPORT TYPE: NOTE    Dictating Practitioner: Larna Daughters, M.D.    DATE OF SERVICE:  11/02/2013    REASON FOR VISIT: FOLLOWUP FOR PULMONARY ARTERIAL HYPERTENSION        HISTORY OF PRESENT ILLNESS: [name] is a 63 year old female,  with a history of WHO Group I pulmonary arterial hypertension associated  with the presence of connective tissue disease and prior anorexigen  exposure, who returns to clinic for followup. She was last seen in March  2015. She reports that her exercise capacity has been stable since that  time. No new changes to her functional status. No worsening lower extremity  edema or abdominal distention. No new chest pain, palpitations, syncope or  presyncope. No difficulty with her tunneled catheter or the remainder of  her PAH medications.    REVIEW OF SYSTEMS: Negative other than as specified in HPI.    ALLERGIES  1. ALLOPURINOL.  2. LEVAQUIN.  3. OFLOXACIN.  4. CHLORHEXIDINE.  5. SULFA.    CURRENT MEDICATIONS  1. Aldactone 50 mg p.o. daily.  2. Calcium with vitamin D.  3. Digoxin 0.25 mg p.o. daily.  4. Estradiol gel 0.25 mg topically nightly.  5. Flolan 29 ng/kg per minute.  6. Lasix 60 mg p.o. b.i.d.  7. Potassium chloride 20 mEq p.o. t.i.d.  8. Sildenafil 80 mg p.o. t.i.d.  9. Coumadin 5 mg 5 days a week, 2.5 mg 2 days a week.    PHYSICAL EXAMINATION  VITAL SIGNS: Blood pressure 122/62, pulse 94, respirations 20, temperature  98.8, weight 161 pounds, height 5 feet 2 inches, saturation 94% on 2 L  nasal cannula.  GENERAL: Awake, alert, oriented, no acute distress. Able to speak in full  sentences.  OROPHARYNX: Clear.  NECK: Flat neck veins. No thyromegaly. No lymphadenopathy.  HEART: Regular rate and rhythm. Loud 2nd heart sound. No S3.  LUNGS: Clear to auscultation bilaterally. No rales. No wheezes.  ABDOMEN: Soft, nondistended, nontender. Positive bowel sounds. No  organomegaly.  EXTREMITIES: No clubbing, cyanosis or edema.  SKIN: Positive Flolan  flushing rash.    ASSESSMENT: [name] is a 63 year old female, with a history of  WHO group I pulmonary arterial hypertension associated with the presence of  connective tissue disease, who is New York Heart Association functional  class III with stable symptoms since she was last seen, and a good 6-minute  walk distance on today's visit over 440 m.    PLAN: We will continue her current dose of intravenous epoprostenol at 29  ng/kg per minute as well as her dose of sildenafil 80 mg p.o. t.i.d.  Additionally, she will continue to use supplemental oxygen as needed both  at rest and with exertion. We will plan to see her back in clinic in  approximately 6 months for routine followup. At this visit, we will obtain  an echocardiogram.                          Unreviewed          DD: 11/03/2013    DT: 11/03/2013 02:47 P   DocNo.: [id number]  [id number]/[id number]                 [id number]    Referring Physician:  [facility]          cc:

## 2014-04-12 ENCOUNTER — Other Ambulatory Visit: Payer: Medicare Other | Attending: Pulmonary Medicine

## 2014-04-12 ENCOUNTER — Ambulatory Visit: Payer: Medicare Other | Attending: Pulmonary Medicine | Admitting: Pulmonary Medicine

## 2014-04-12 VITALS — BP 151/75 | HR 66 | Temp 97.9°F | Resp 18 | Ht 62.0 in | Wt 161.0 lb

## 2014-04-12 DIAGNOSIS — I509 Heart failure, unspecified: Secondary | ICD-10-CM | POA: Insufficient documentation

## 2014-04-12 DIAGNOSIS — I270 Primary pulmonary hypertension: Principal | ICD-10-CM | POA: Insufficient documentation

## 2014-04-12 LAB — COMPREHENSIVE METABOLIC PANEL, BLOOD
ALT (SGPT): 10 U/L (ref 0–33)
AST (SGOT): 20 U/L (ref 0–32)
Albumin: 4.3 g/dL (ref 3.5–5.2)
Alkaline Phos: 78 U/L (ref 35–140)
Anion Gap: 13 mmol/L (ref 7–15)
BUN: 28 mg/dL — ABNORMAL HIGH (ref 6–20)
Bicarbonate: 26 mmol/L (ref 22–29)
Bilirubin, Tot: 0.45 mg/dL (ref <1.20)
Calcium: 10.2 mg/dL (ref 8.5–10.6)
Chloride: 102 mmol/L (ref 98–107)
Creatinine: 1.18 mg/dL — ABNORMAL HIGH (ref 0.51–0.95)
GFR: 46 mL/min
Glucose: 86 mg/dL (ref 70–115)
Potassium: 4.6 mmol/L (ref 3.5–5.1)
Sodium: 141 mmol/L (ref 136–145)
Total Protein: 8.5 g/dL — ABNORMAL HIGH (ref 6.0–8.0)

## 2014-04-12 LAB — CBC WITH DIFF, BLOOD
ANC-Automated: 3 10*3/uL (ref 1.6–7.0)
Abs Eosinophils: 0.1 10*3/uL (ref 0.1–0.7)
Abs Lymphs: 2 10*3/uL (ref 0.8–3.1)
Abs Monos: 0.4 10*3/uL (ref 0.2–0.8)
Eosinophils: 1 % (ref 1–4)
Hct: 41.4 % (ref 34.0–45.0)
Hgb: 13.1 gm/dL (ref 11.2–15.7)
Imm Gran %: 1 % (ref <1)
Lymphocytes: 36 % (ref 19–53)
MCH: 25.2 pg — ABNORMAL LOW (ref 26.0–32.0)
MCHC: 31.6 % — ABNORMAL LOW (ref 32.0–36.0)
MCV: 79.6 um3 (ref 79.0–95.0)
MPV: 10.2 fL (ref 9.4–12.4)
Monocytes: 7 % (ref 5–12)
Plt Count: 141 10*3/uL (ref 140–370)
RBC: 5.2 10*6/uL (ref 3.90–5.20)
RDW: 16.7 % — ABNORMAL HIGH (ref 12.0–14.0)
Segs: 55 % (ref 34–71)
WBC: 5.5 10*3/uL (ref 4.0–10.0)

## 2014-04-12 LAB — PRO BNP, BLOOD: BNPP: 1307 pg/mL — ABNORMAL HIGH (ref 0–899)

## 2014-04-18 NOTE — Progress Notes (Unsigned)
CLINIC: [facility]      REPORT TYPE: NOTE      Dictating Practitioner: Larna Daughters, M.D.    DATE OF SERVICE:  04/12/2014    REASON FOR VISIT: FOLLOWUP          CHIEF COMPLAINT: Followup for pulmonary arterial hypertension.    HISTORY OF PRESENT ILLNESS: [name] is a 64 year old female  with a history of WHO group I pulmonary arterial hypertension, who returns  to clinic for followup. Since she was last seen, she reports that she has  most recently been feeling a little bit fatigued, but otherwise had stable  cardiopulmonary exercise symptoms. She has had no difficulty with her  tunneled line and no changes in her dose of medications recently.    REVIEW OF SYSTEMS: Negative, other than as specified in HPI.    ALLERGIES  1. ALLOPURINOL.  2. LEVAQUIN.  3. OFLOXACIN.  4. CHLORHEXIDINE.  5. SULFA.    CURRENT MEDICATIONS  1. Aldactone 50 mg p.o. b.i.d.  2. Calcium with vitamin D.  3. Digoxin 0.25 mg p.o. daily.  4. Sildenafil 80 mg p.o. t.i.d.  5. Potassium chloride 20 mEq p.o. 3 times daily.  6. Lasix 60 mg p.o. b.i.d.  7. Folic acid 314 mcg p.o. daily.  8. Intravenous epoprostenol 29 Ng/kg per minute.    PHYSICAL EXAMINATION  VITAL SIGNS: Blood pressure 151/75, pulse 66, respirations 18, temperature  97.9, weight 161 pounds, height 5 feet 2 inches, saturation 94% on 2  L/min.  GENERAL: No acute distress. Able to speak in full sentences.  HEENT: Oropharynx clear.  NECK: Minimal elevation of jugular venous distention. No thyromegaly. No  lymphadenopathy.  HEART: Regular rate and rhythm, loud second heart sound, positive systolic  murmur. No S3.  LUNGS: Clear to auscultation bilaterally. No rales at the apex, positive  rales at the bases. No wheezes.  ABDOMEN: Soft, nondistended, nontender, positive bowel sounds. No  organomegaly noted.  EXTREMITIES: No clubbing, cyanosis, or edema.  SKIN: Positive Flolan changes and some telangiectasias. No new rashes.    ASSESSMENT: [name] is a  64 year old female with a history of  World Health Organization group I pulmonary arterial hypertension  associated with anorexigen use and connective tissue disease, who has  stable cardiopulmonary exercise symptoms, New York Heart Association  functional class II.    PLAN: We will continue her current dose of PH therapy and plan to see her  back in clinic in approximately 6 months for followup with an  echocardiogram.                          Unreviewed          DD: 04/17/2014    DT: 04/18/2014 05:24 A   DocNo.: [id number]  [id number]                 [id number]    Referring Physician:  [facility]          cc:

## 2014-06-06 NOTE — Progress Notes (Signed)
This office note has been dictated.

## 2014-09-20 ENCOUNTER — Encounter (HOSPITAL_COMMUNITY): Payer: Self-pay | Admitting: Pulmonary Medicine

## 2014-09-20 DIAGNOSIS — I270 Primary pulmonary hypertension: Principal | ICD-10-CM

## 2014-09-27 ENCOUNTER — Encounter (HOSPITAL_COMMUNITY): Payer: Self-pay | Admitting: Pulmonary Medicine

## 2014-09-27 ENCOUNTER — Ambulatory Visit: Payer: Medicare Other | Attending: Pulmonary Medicine | Admitting: Pulmonary Medicine

## 2014-09-27 VITALS — BP 129/66 | HR 83 | Temp 98.5°F | Resp 16 | Ht 62.0 in | Wt 165.4 lb

## 2014-09-27 DIAGNOSIS — R0981 Nasal congestion: Principal | ICD-10-CM | POA: Insufficient documentation

## 2014-09-27 MED ORDER — AZELASTINE HCL 137 MCG/SPRAY NA SOLN
1.0000 | Freq: Two times a day (BID) | NASAL | 3 refills | Status: DC
Start: 2014-09-27 — End: 2017-07-15

## 2014-09-27 NOTE — Progress Notes (Signed)
This office note has been dictated.

## 2014-09-28 NOTE — Progress Notes (Unsigned)
CLINIC: [facility]      REPORT TYPE: CONSULTATION      Dictating Practitioner: Larna Daughters, M.D.    DATE OF SERVICE:  09/27/2014    REASON FOR VISIT: EVALUATION            CHIEF COMPLAINT: Followup for pulmonary arterial hypertension.    HISTORY OF PRESENT ILLNESS: [name] is a 64 year old  female with a history of WHO Group I pulmonary arterial hypertension  associated with prior anorexigen use, who returns to clinic for followup.  Since she was last seen, she reports stable cardiopulmonary exercise  symptoms, no new chest pains, palpitations, syncope, presyncope, lower  extremity edema or abdominal distention. She does complain of mild nasal  congestion and nasal runniness, no fevers, chills, no worsening lower  extremity edema or abdominal distention.    REVIEW OF SYSTEMS: Negative, other than as specified in HPI.    ALLERGIES  1. LEVAQUIN.  2. OFLOXACIN.  3. CHLORHEXIDINE.  4. SULFA DRUGS.    CURRENT MEDICATIONS  1. Aldactone 50 mg p.o. b.i.d.  2. Calcium with Vitamin D.  3. Digoxin 0.25 mg p.o. daily.  4. Intravenous epoprostenol 29 ng/kg per minute.  5. Lasix 20 mg tablets, 3 tablets p.o. 2 times daily.  6. Potassium chloride 20 mEq p.o. 3 times daily.  7. Sildenafil 80 mg p.o. 3 times daily.    PHYSICAL EXAMINATION  VITAL SIGNS: Blood pressure 129/66, pulse 83, respirations 16, temperature  98.5, weight 165 pounds, height 5 feet 2 inches, saturation 95% on 2 L  nasal cannula.  GENERAL: Awake, alert, oriented, no acute distress. Able to speak in full  sentences.  HEENT: Oropharynx clear.  NECK: Flat neck veins. No thyromegaly, no lymphadenopathy.  HEART: Regular rate and rhythm, loud second heart sound, positive systolic  murmur. No S3. LUNGS: Clear to auscultation bilaterally. No rales, no  wheezes.  ABDOMEN: Soft, nondistended, nontender, positive bowel sounds. No  organomegaly noted. EXTREMITIES: No clubbing, cyanosis, or edema. Tunnel  line site clean, dry, intact. No evidence of  crusting or drainage.    ASSESSMENT: [name] is a 64 year old female with a history WHO  group I pulmonary arterial hypertension associated with prior anorexigen  use, who is currently New York Heart Association functional class II, with  stable symptoms since she was last seen.    PLAN  1. We will continue her current PAH therapy, including sildenafil 80 mg  p.o. t.i.d. as well as intravenous epoprostenol 29 ng/kg per minute. We  will plan to see her back in clinic in approximately 4-6 months with a  repeat echocardiogram and 6-minute walk test.  2. In terms of her nasal congestion and discharge, we will try her on an  inhaled antihistamine, Astelin nasal spray 1 spray into each nostril daily  and determine if this improves her symptoms.                          Unreviewed          DD: 09/27/2014    DT: 09/28/2014 05:51 P   DocNo.: [id number]  [id number]                 [id number]    Referring Physician:  [facility]          cc:

## 2014-10-04 ENCOUNTER — Encounter (HOSPITAL_COMMUNITY): Payer: Self-pay | Admitting: Pulmonary Medicine

## 2014-10-04 DIAGNOSIS — R0609 Other forms of dyspnea: Principal | ICD-10-CM

## 2014-10-04 DIAGNOSIS — R0989 Other specified symptoms and signs involving the circulatory and respiratory systems: Principal | ICD-10-CM

## 2014-12-21 ENCOUNTER — Emergency Department
Admission: EM | Admit: 2014-12-21 | Discharge: 2014-12-21 | Disposition: A | Discharge status: Home / Self Care | Payer: Medicare Other | Attending: Emergency Medicine | Admitting: Emergency Medicine

## 2014-12-21 ENCOUNTER — Encounter (HOSPITAL_BASED_OUTPATIENT_CLINIC_OR_DEPARTMENT_OTHER): Payer: Self-pay

## 2014-12-21 DIAGNOSIS — I50812 Chronic right heart failure: Secondary | ICD-10-CM

## 2014-12-21 DIAGNOSIS — I2721 Secondary pulmonary arterial hypertension: Secondary | ICD-10-CM

## 2014-12-21 DIAGNOSIS — Z9981 Dependence on supplemental oxygen: Secondary | ICD-10-CM | POA: Insufficient documentation

## 2014-12-21 DIAGNOSIS — R5383 Other fatigue: Secondary | ICD-10-CM | POA: Insufficient documentation

## 2014-12-21 DIAGNOSIS — R509 Fever, unspecified: Secondary | ICD-10-CM | POA: Insufficient documentation

## 2014-12-21 DIAGNOSIS — R0981 Nasal congestion: Secondary | ICD-10-CM | POA: Insufficient documentation

## 2014-12-21 DIAGNOSIS — N289 Disorder of kidney and ureter, unspecified: Secondary | ICD-10-CM | POA: Insufficient documentation

## 2014-12-21 DIAGNOSIS — R5381 Other malaise: Secondary | ICD-10-CM | POA: Insufficient documentation

## 2014-12-21 DIAGNOSIS — Z87891 Personal history of nicotine dependence: Secondary | ICD-10-CM | POA: Insufficient documentation

## 2014-12-21 DIAGNOSIS — I509 Heart failure, unspecified: Secondary | ICD-10-CM | POA: Insufficient documentation

## 2014-12-21 DIAGNOSIS — J810 Acute pulmonary edema: Secondary | ICD-10-CM

## 2014-12-21 DIAGNOSIS — R05 Cough: Secondary | ICD-10-CM | POA: Insufficient documentation

## 2014-12-21 DIAGNOSIS — R0902 Hypoxemia: Secondary | ICD-10-CM | POA: Insufficient documentation

## 2014-12-21 DIAGNOSIS — I272 Other secondary pulmonary hypertension: Secondary | ICD-10-CM | POA: Insufficient documentation

## 2014-12-21 DIAGNOSIS — J029 Acute pharyngitis, unspecified: Secondary | ICD-10-CM | POA: Insufficient documentation

## 2014-12-21 DIAGNOSIS — J986 Disorders of diaphragm: Secondary | ICD-10-CM

## 2014-12-21 DIAGNOSIS — Z959 Presence of cardiac and vascular implant and graft, unspecified: Secondary | ICD-10-CM

## 2014-12-21 DIAGNOSIS — R600 Localized edema: Secondary | ICD-10-CM | POA: Insufficient documentation

## 2014-12-21 DIAGNOSIS — R0602 Shortness of breath: Principal | ICD-10-CM | POA: Insufficient documentation

## 2014-12-21 DIAGNOSIS — I517 Cardiomegaly: Secondary | ICD-10-CM

## 2014-12-21 LAB — CBC WITH DIFF, BLOOD
ANC-Automated: 3.1 10*3/uL (ref 1.6–7.0)
Abs Eosinophils: 0.1 10*3/uL (ref 0.1–0.5)
Abs Lymphs: 1.2 10*3/uL (ref 0.8–3.1)
Abs Monos: 0.3 10*3/uL (ref 0.2–0.8)
Eosinophils: 1 %
Hct: 36.7 % (ref 34.0–45.0)
Hgb: 11.8 gm/dL (ref 11.2–15.7)
Imm Gran %: 1 % (ref <1)
Imm Gran Abs: 0.1 10*3/uL (ref <0.1)
Lymphocytes: 26 %
MCH: 25.2 pg — ABNORMAL LOW (ref 26.0–32.0)
MCHC: 32.2 % (ref 32.0–36.0)
MCV: 78.3 um3 — ABNORMAL LOW (ref 79.0–95.0)
MPV: 9.7 fL (ref 9.4–12.4)
Monocytes: 6 %
Plt Count: 125 10*3/uL — ABNORMAL LOW (ref 140–370)
RBC: 4.69 10*6/uL (ref 3.90–5.20)
RDW: 16 % — ABNORMAL HIGH (ref 12.0–14.0)
Segs: 65 %
WBC: 4.7 10*3/uL (ref 4.0–10.0)

## 2014-12-21 LAB — PRO BNP, BLOOD: BNPP: 2180 pg/mL — ABNORMAL HIGH (ref 0–899)

## 2014-12-21 LAB — INFLUENZA A/B PANEL (CALM LAB)
Influenza A, Rapid: NEGATIVE
Influenza B, Rapid: NEGATIVE

## 2014-12-21 LAB — ECG 12-LEAD
ATRIAL RATE: 83 {beats}/min
P AXIS: 43 degrees
PR INTERVAL: 246 ms
QRS INTERVAL/DURATION: 86 ms
QT: 352 ms
QTC INTERVAL: 413 ms
R AXIS: 112 degrees
T AXIS: -45 degrees
VENTRICULAR RATE: 83 {beats}/min

## 2014-12-21 LAB — BASIC METABOLIC PANEL, BLOOD
Anion Gap: 11 mmol/L (ref 7–15)
BUN: 28 mg/dL — ABNORMAL HIGH (ref 8–23)
Bicarbonate: 26 mmol/L (ref 22–29)
Calcium: 9.8 mg/dL (ref 8.5–10.6)
Chloride: 104 mmol/L (ref 98–107)
Creatinine: 1.34 mg/dL — ABNORMAL HIGH (ref 0.51–0.95)
GFR: 40 mL/min
Glucose: 126 mg/dL — ABNORMAL HIGH (ref 70–99)
Potassium: 4 mmol/L (ref 3.5–5.1)
Sodium: 141 mmol/L (ref 136–145)

## 2014-12-21 LAB — RAPID GROUP A STREP SCREEN: Rapid Group A Strep Screen: NEGATIVE

## 2014-12-21 LAB — MAGNESIUM, BLOOD: Magnesium: 2.2 mg/dL (ref 1.6–2.4)

## 2014-12-21 LAB — PROTHROMBIN TIME, BLOOD
INR: 2.2
PT,Patient: 24.1 s — ABNORMAL HIGH (ref 9.7–12.5)

## 2014-12-21 LAB — PHOSPHORUS, BLOOD: Phosphorous: 2.9 mg/dL (ref 2.7–4.5)

## 2014-12-21 LAB — CPK-CREATINE PHOSPHOKINASE, BLOOD: CPK: 46 U/L (ref 0–175)

## 2014-12-21 LAB — TROPONIN T, BLOOD: Troponin T: 0.03 ng/mL (ref <0.01)

## 2014-12-21 LAB — APTT, BLOOD: PTT: 36.6 s — ABNORMAL HIGH (ref 25.0–34.0)

## 2014-12-21 MED ORDER — FUROSEMIDE 10 MG/ML IJ SOLN
40.00 mg | Freq: Once | INTRAMUSCULAR | Status: AC
Start: 2014-12-21 — End: 2014-12-21
  Administered 2014-12-21: 40 mg via INTRAVENOUS
  Filled 2014-12-21: qty 4

## 2014-12-21 NOTE — ED Notes (Signed)
VS stable.   Dr. Philis Kendall to bedside to discuss discharge.  Pt. oob to void on commode again.  Pt. Iv saline locke removed intact from lt ac.

## 2014-12-21 NOTE — Progress Notes (Signed)
Prostacyclin (Flolan/Veletri/Remodulin)      Prostacyclin MCP 333.1      Prostacyclin: Flolan - IV    Prostacyclin Dosing Weight: 86 kg     Dose: 29 ng/kg/min    Concentration:  45,000 ng/mL    Rate: Flolan/Veletri/Remodulin IV CADD:  80 mL/24hr    Last Changed: 12/9 0900  84.9 mLs remaining at 12/8 1345  Changes everyday     Patient has backup pump and battery     Has 2 pumps in date?: yes    Chesley Noon, [facility]

## 2014-12-21 NOTE — Progress Notes (Signed)
Pulmonary Hypertension Service Consult Note    Date of Service: 12/21/2014    Consultation Requested By: Dr. Joette Catching (ED)    Consultation Reason: pHTN patient with SOB; patient of Dr. [name] (last seen 09/2014)    Subjective/HPI:   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 She states that she has had increasing SOB/DOE for the last 3-4 days. Associated symptoms include a low grade fever (99.5) 2 days ago, mild chills. She reports a minimally productive cough, mostly in the morning. The sputum is described as clear and scant. She denies any chest pain, palpitations, abdominal pain, myalgias or arthralgias. She has chronic non-bloody diarrhea which is unchanged. She denies any recent sick contacts, medication changes although she does report that she missed a few doses of lasix over the last few days. She traveled 400 miles this am from her home in [place].     In the ED, she had stable vitals, normal WBC count, stable sCr. Her pBNP was 2100 (1300 on last check) and her CXR had stable enlarged heart and pulmonary arteries with some b/l interstitial edema. She was given a dose of lasix IV 40 x1.     Past Medical/Surgical History:  - WHO Class pHTN; on flolan and sildenafil; follows with Dr. [name]. Last TTE was 05/2013 (see below)  - Sjorgren's  - chronic hypoxic respiratory failure; baseline O2 of 2 LPM   - TTE, 05/2013:    1) Normal left ventricular size and systolic function.  2) Mild concentric LV hypertrophy.  3) Abnormal septal motion suggesting RV pressure overload.  4) Right ventricle is moderately to severely enlarged with mild depressed  function.  5) Severe pulmonary hypertension.  6) Mild tricuspid regurgitation.  7) Compared to previous study on 04/16/2011, PA pressures are slighly lower.    Allergies:   Allergies   Allergen Reactions    Allopurinol Hives    Levaquin Swelling  and Other     Severe joint pain      Ofloxacin Nausea and Vomiting    Chlorhexidine Itching    Sulfa Drugs Unspecified       Medications:   No current facility-administered medications on file prior to encounter.      Current Outpatient Prescriptions on File Prior to Encounter   Medication Sig Dispense Refill    ALDACTONE 50 MG OR TABS one tab twice daily      azelastine (ASTELIN) 0.1 % nasal spray Spray 1 spray into each nostril 2 times daily. Use in each nostril as directed 1 bottle 3    Cholecalciferol 2000 UNITS CAPS Take 1 capsule by mouth daily.      digoxin (LANOXIN) 0.25 MG tablet Take 250 mcg by mouth daily.      Estradiol (DIVIGEL) 0.25 MG/0.25GM GEL Apply 0.25 mg topically nightly.      estradiol (ESTRING) 2 MG vaginal ring Insert 1 Ring vaginally Every 3 months. Follow package directions      FLOLAN 1.5 MG IV SOLR 99991111      FOLIC ACID A999333 MCG OR TABS 1 TABLET DAILY      furosemide (LASIX) 20 MG tablet Take 3 tablets by mouth once as needed (edema). 30 tablet 3    furosemide (LASIX) 20 MG tablet Take 3 tablets by mouth 2 times daily. 180 tablet 3    medroxyPROGESTERone (PROVERA) 2.5 MG tablet  Take 1.25 mg by mouth daily.      potassium chloride (KLOR-CON M20) 20 MEQ tablet Take 1 tablet by mouth 3 times daily. 90 tablet 6    predniSONE (STERAPRED DS UNI-PAK) 10 MG Package Take following package instructions 21 tablet 0    sildenafil (REVATIO) 20 MG tablet Take 4 tablets by mouth 3 times daily. Take 4 tablets three times daily   90 tablet 5    warfarin (COUMADIN) 5 MG tablet Take 1 tablet by mouth daily. 5mg  x 5days a week 2.5mg  2 days/wk 45 tablet 5       Social History:  - married  - lives in [place]  - former smoker, quit 35 years ago  - minimal EtOH  - no illicits     Family History:  - one healthy child, age 2  - no FH of rheumatologic disease or lung disease    Review of Systems:  - a full 14 point ROS was obtained and was negative except what is documented in the  HPI    Objective  Temperature:  [98 F (36.7 C)] 98 F (36.7 C) (12/08 1154)  Blood pressure (BP): (125-150)/(49-77) 125/60 (12/08 1529)  Heart Rate:  [77-90] 78 (12/08 1529)  Respirations:  [19-25] 25 (12/08 1529)  Pain Score: 2 (12/08 1154)  SpO2:  [95 %-97 %] 95 % (12/08 1529)    Physical Exam  General: No acute distress. Alert and oriented. Pleasant.   HEENT: NCAT. PERRLA, EOMI. Anicteric.  Clear nasopharynx/oropharynx. MMM  Neck: Elevated JVP  CV: RRR, loud S2  Lungs: good effort; bibasilar crackles   Abdomen: Soft, ND, NT, + BS. No guarding or rebound  Extremities: 1-2+ n/l LE edema;  Warm distal extremities   Skin: No rashes or ecchymoses   Neuro: CN II-XII grossly intact, strength/sensation grossly intact  Psych: euthymic       Labs  Recent Labs      12/21/14   1300   NA  141   K  4.0   CL  104   BICARB  26   BUN  28*   CREAT  1.34*   Ruso  9.8   MG  2.2   PHOS  2.9       Recent Labs      12/21/14   1300   WBC  4.7   HGB  11.8   HCT  36.7   MCV  78.3*   PLT  125*   SEG  65   LYMPHS  26   MONOS  6   EOS  1       Recent Labs      12/21/14   1300   PTT  36.6*   INR  2.2       Recent Labs      12/21/14   1300   CPK  46   TROPONIN  0.03       No results for input(s): LACTATE in the last 72 hours.    Micro  Rapid influenza and strep pending     Antibiotics  - none     Imaging  CXR, 12/8: independently reviewed with Dr. Rudi Rummage  -- stable cardiomegaly and enlargement of the pulmonary arteries  -- no pleural effusions or PTX  -- b/l interstitial edema       ASSESSMENT AND PLAN  [name] is a 64 year old year old female with WHO Class I pHTN in the setting of CTD, chronic hypoxia who  presents today with several days of SOB/DOE, minimal cough and low grade fever likely from viral URI and a component of volume overload although overall she looks good. She is on her home O2 requirement, has a normal WBC count and a CXR without PNA (evidence of interstitial edema).     # Dyspnea:likely a viral URI and a  component of cardiogenic edema. Stable on her home O2, afebrile   - continue pHTN regimen as outlined below   - defer antibiotics as no evidence of infection     # pHTN:   - continue flolan and sildenafil  - continue lasix, okay to take an extra dose each day for the next 2-3 days (will need repeat BMP next week - through her PCP in [place])  - continue aldactone and digoxin  - daily weights, low Na+ diet  - continue home O2  - follow up with Dr. [name] after the new year at which time she will have a repeat TTE     This patient was seen and discussed with Dr. Tyson Dense    Alfonse Ras, M.D.  Fellow  Department of Pulmonary and Critical Care Medicine  [phone]

## 2014-12-21 NOTE — ED Notes (Signed)
Pt. Voided in bedside commode.   Pulmonary HTN staff at the bedside.

## 2014-12-21 NOTE — ED Notes (Signed)
Dosing WT> 86 KG  Concentration : 45,000 ng/ml  Vial Strength: 1.5 mg  Vials used: 3   Dose : 29 ng/kg/min (onset 02/21/2013)  Checked by Pharmacy.      PCXR done.

## 2014-12-21 NOTE — ED Notes (Signed)
Pt. Given full written and verbal follow up instructions.   Voided again in commode.  Home ambulatory with spouse.

## 2014-12-21 NOTE — ED Provider Notes (Signed)
Emergency Department Attending Note    Diana Chambers  MRN: [id number]  DOB: 1950/11/23  PMD: Provider, Not In System    CC: Shortness of Breath      HPI: Pt seen and examined. Pt is a 64 year old female  has a past medical history of Pulmonary HTN (CMS-HCC) and Sjoegren syndrome (CMS-HCC) (07/12/2009). She also has no past medical history of CHRONIC AIRWAY OBSTRUCTION NEC or Unspecified asthma(493.90). and presents with shortness of breath which has progressively getting worse for one week.  States had to increase her O2 from 2 to 3 liters.  States she just doesn't feel good. Admits to sore throat and generalized weakness.  Denies chest pain,n/v/d/f/c,ha,dizziness,abd pain,or urinary symptoms.    Review of Systems   All other systems reviewed and negative unless otherwise noted in the HPI or above.    Review of Systems   Constitutional: Positive for malaise/fatigue. Negative for chills and fever.   HENT: Positive for sore throat.    Eyes: Negative for blurred vision and double vision.   Respiratory: Positive for shortness of breath. Negative for cough.    Cardiovascular: Negative for chest pain and palpitations.   Gastrointestinal: Negative for abdominal pain, nausea and vomiting.   Genitourinary: Negative for dysuria and hematuria.   Musculoskeletal: Negative for back pain and neck pain.   Skin: Negative for itching and rash.   Neurological: Negative for dizziness, loss of consciousness and headaches.          PMH-  Past Medical History   Diagnosis Date    Pulmonary HTN (CMS-HCC)     Sjoegren syndrome (CMS-HCC) 07/12/2009       PSH - tonsillectomy      Prior to Admission Medications   Prescriptions Last Dose Informant Patient Reported? Taking?   ALDACTONE 50 MG OR TABS   Yes No   Sig: one tab twice daily   Cholecalciferol 2000 UNITS CAPS   Yes No   Sig: Take 1 capsule by mouth daily.   Estradiol (DIVIGEL) 0.25 MG/0.25GM GEL   Yes No   Sig: Apply 0.25 mg topically nightly.   FLOLAN 1.5 MG IV SOLR   Yes No      Sig: 99991111   FOLIC ACID A999333 MCG OR TABS   Yes No   Sig: 1 TABLET DAILY   azelastine (ASTELIN) 0.1 % nasal spray   No No   Sig: Spray 1 spray into each nostril 2 times daily. Use in each nostril as directed   digoxin (LANOXIN) 0.25 MG tablet   Yes No   Sig: Take 250 mcg by mouth daily.   estradiol (ESTRING) 2 MG vaginal ring   Yes No   Sig: Insert 1 Ring vaginally Every 3 months. Follow package directions   furosemide (LASIX) 20 MG tablet   No No   Sig: Take 3 tablets by mouth 2 times daily.   furosemide (LASIX) 20 MG tablet   No No   Sig: Take 3 tablets by mouth once as needed (edema).   medroxyPROGESTERone (PROVERA) 2.5 MG tablet   Yes No   Sig: Take 1.25 mg by mouth daily.   potassium chloride (KLOR-CON M20) 20 MEQ tablet   No No   Sig: Take 1 tablet by mouth 3 times daily.   predniSONE (STERAPRED DS UNI-PAK) 10 MG Package   No No   Sig: Take following package instructions   sildenafil (REVATIO) 20 MG tablet   No No   Sig: Take 4 tablets  by mouth 3 times daily. Take 4 tablets three times daily     warfarin (COUMADIN) 5 MG tablet   No No   Sig: Take 1 tablet by mouth daily. 5mg  x 5days a week 2.5mg  2 days/wk      Facility-Administered Medications: None       ALLERGIES: Allopurinol; Levaquin; Ofloxacin; Chlorhexidine; and Sulfa drugs    SOCIAL HISTORY:   Social History   Substance Use Topics    Smoking status: Former Smoker     Packs/day: 0.50     Years: 5.00     Quit date: 01/13/1978    Smokeless tobacco: Never Used    Alcohol use No       FAMILY HISTORY: family history includes Heart Disease in her father and mother; Hypertension in her mother; Psychiatry in her mother.    I have reviewed the patient's medical history as available in EPIC.      PHYSICAL EXAM:  BP 125/66  Pulse 78  Temp 98 F (36.7 C)  Resp 15  Ht 5\' 2"  (1.575 m)  Wt 76.3 kg (168 lb 5 oz)  SpO2 96%  BMI 30.78 kg/m2  GEN:  Alert and oriented.  Patient looks well hydrated, well nourished, and non-toxic.  HEENT: Normocephalic and atraumatic.  Pupils equal, round, and reactive to light. Extraocular motions intact. Mucous membranes moist. Naso-oropharynx clear.   Neck: No lymphadenopathy or jugular venous distention.   Chest: Clear to auscultation bilaterally with normal respiratory effort.   Cardiac: Regular rate and rhythm,    Abdomen: Soft, non-distended, bowel sounds present, non-tender.  Back: No stepoffs, deformities, or tenderness. No costovertebral angle tenderness.   GU: deferred   Extremities: No cyanosis or edema. Neurovascularly intact.   Psychiatric: appropriate   Skin: no jaundice or rash   Neurologic: Alert and oriented x4. Normal speech and language. Normal gait.         DIAGNOSTIC STUDIES AND INTERPRETATIONS:     EKG:  Normal sinus rhythm with a rate of 83.  There are no ectopy.  +non-specific ST segment depression which is unchanged from previous ekg.        Result Date: 12/21/2014  EXAM DESCRIPTION: CHEST SINGLE VIEW FRONTAL CLINICAL HISTORY: Chest pain COMPARISON: Chest radiograph 02/21/2013 FINDINGS: See IMPRESSION. IMPRESSION: Interval development of mild interstitial edema. Enlarged cardiac silhouette. Calcified aorta. Dilated main and central pulmonary arteries compatible with pulmonary hypertension. Stable left IJ central venous catheter. Query trace bilateral pleural effusions. Right lateral chest wall subcutaneous calcification. Stable elevation of the right hemidiaphragm. No other significant interval change.    Labs:  Results for orders placed or performed during the hospital encounter of [id number]   BASIC METABOLIC PANEL, BLOOD   Result Value Ref Range    Glucose 126 (H) 70 - 99 mg/dL    BUN 28 (H) 8 - 23 mg/dL    Creatinine 1.34 (H) 0.51 - 0.95 mg/dL    GFR 40 mL/min    Sodium 141 136 - 145 mmol/L    Potassium 4.0 3.5 - 5.1 mmol/L    Chloride 104 98 - 107 mmol/L    Bicarbonate 26 22 - 29 mmol/L    Anion Gap 11 7 - 15 mmol/L    Calcium 9.8 8.5 - 10.6 mg/dL   PHOSPHORUS, BLOOD   Result Value Ref Range    Phosphorous 2.9 2.7 - 4.5  mg/dL   MAGNESIUM, BLOOD   Result Value Ref Range    Magnesium 2.2 1.6 - 2.4 mg/dL  CPK-CREATINE PHOSPHOKINASE, BLOOD   Result Value Ref Range    CPK 46 0 - 175 U/L   TROPONIN T, BLOOD   Result Value Ref Range    Troponin T 0.03 <0.01 ng/mL   CBC WITH ADIFF, BLOOD   Result Value Ref Range    WBC 4.7 4.0 - 10.0 1000/mm3    RBC 4.69 3.90 - 5.20 mill/mm3    Hgb 11.8 11.2 - 15.7 gm/dL    Hct 36.7 34.0 - 45.0 %    MCV 78.3 (L) 79.0 - 95.0 um3    MCH 25.2 (L) 26.0 - 32.0 pgm    MCHC 32.2 32.0 - 36.0 %    RDW 16.0 (H) 12.0 - 14.0 %    MPV 9.7 9.4 - 12.4 fL    Plt Count 125 (L) 140 - 370 1000/mm3    Diff Type Automated     Segs 65 %    Imm Gran % 1 <1 %    Lymphocytes 26 %    Monocytes 6 %    Eosinophils 1 %    ANC-Automated 3.1 1.6 - 7.0 1000/mm3    Imm Gran Abs 0.1 <0.1 1000/mm3    Abs Lymphs 1.2 0.8 - 3.1 1000/mm3    Abs Monos 0.3 0.2 - 0.8 1000/mm3    Abs Eosinophils 0.1 <0.1 - 0.5 1000/mm3   PROTHROMBIN TIME, BLOOD   Result Value Ref Range    PT,Patient 24.1 (H) 9.7 - 12.5 sec    INR 2.2    APTT, BLOOD   Result Value Ref Range    PTT 36.6 (H) 25.0 - 34.0 sec   RAPID GROUP A STREP SCREEN   Result Value Ref Range    Rapid Group A Strep Screen Negative Negative   INFLUENZA A/B RAPID PCR   Result Value Ref Range    Specimen Fluid Nasopharyngeal     Influenza A, Rapid Negative Negative    Influenza B, Rapid Negative Negative   PRO BNP, BLOOD   Result Value Ref Range    BNPP 2180 (H) 0 - 899 pg/mL   ECG 12-LEAD   Result Value Ref Range    VENTRICULAR RATE 83 BPM    ATRIAL RATE 83 BPM    PR INTERVAL 246 ms    QRS INTERVAL/DURATION 86 ms    QT 352 ms    QTC INTERVAL 413 ms    P AXIS 43 degrees    R AXIS 112 degrees    T AXIS -45 degrees    ECG INTERPRETATION       Sinus rhythm with 1st degree AV block  Right axis deviation  ST & T wave abnormality, consider inferior ischemia  Abnormal ECG    Confirmed by Above generated by computer only, Results in ED notes (204),   editor HADNOT, ANTHONY ([id number]) on 12/21/2014 3:40:37 PM                All lab results reviewed. Pertinent findings discussed with the patient. She understands the findings.         EMERGENCY ROOM COURSE AND MEDICAL DECISION MAKING:   Patient was placed on cardiac monitor, which showed NSR with a rate of 82,  there was no ectopy or ST segment changes.  Patient was also placed on oxygen 3 liters by NC with oxygenation of 97%.  Medications   furosemide (LASIX) injection 40 mg (40 mg IntraVENOUS Given 12/21/14 1528)     Patient had good urine output.  Pulmonary hypertension team consulted who evaluated patient and cleared her for discharge.  No new diuretics.    Diagnosis:     ICD-10-CM ICD-9-CM    1. SOB (shortness of breath) R06.02 786.05    2. Chronic right-sided congestive heart failure (CMS-HCC) I50.9 428.0    3. PAH (PULMONARY ARTERY HYPERTENSION) I27.2 416.8    4. Renal insufficiency N28.9 593.9        PLAN:   Follow up with your primary care MD in 1-2 days  Return to er if condition worsens or concerns arise    CONDITION: STABLE  D/w pt who understands and agrees with plan as stated, all questions answered  Return precautions reviewed and understood       Valentina Shaggy, DO  12/21/14 1647

## 2014-12-21 NOTE — ED Notes (Signed)
Pharmacist at the bedside checking pt. Flolan pump.

## 2014-12-21 NOTE — ED Notes (Signed)
Late note:   Pt. To ED with spouse, ambulatory , with oxygen via np. Pt. Is a Flolan pt. For 15 yrs. From [place] , [place] who states she has been having sore throat, mild increased sob over several days. She decided to drive over yest. , stay in a hotel and have eval. Here today. Pt. Has Flolan infusing to Broviac catheter in mid chest. Pump infusing with 87.5 ml remaining of medication, 11.83ml/hr. (29 nanograms) and on icepacks. Pt. States she has additional medication and pump in her vehicle in the parking lot. Spouse asked to bring extra pump into the ED.   Pt. On all vs monitors NSR without ectopy. Alarms on and audible. VS stable. Oxygen via np.4l/min np. IV saline locke started in lt. Ac and venous bloods sent to lab. Nasal and throat swabs obtained and sent to lab for flu and strep. Pt. Did not have a flu vaccination this year.

## 2014-12-21 NOTE — ED Notes (Signed)
12/21/2014 12:12 PM Diana Chambers    An EKG was handed to Dr. Philis Kendall, along with previous.

## 2014-12-21 NOTE — ED Notes (Signed)
Pharmacy , Flolan nurse and pulmonary HTN fellow notified by ED administrative assistant upon pt. Arrival.

## 2014-12-21 NOTE — Discharge Instructions (Signed)
Congestive Heart Failure    You were seen for an episode of congestive heart failure (CHF).    Congestive heart failure (CHF) is when fluid builds up in your lungs because of a heart problem. The main symptom is shortness of breath. It often gets worse when you lie down flat. You may also notice leg swelling and weight gain. You were probably diagnosed with congestive heart failure at past doctor visits.    The diagnosis is based on your history, physical exam, EKG and chest x-ray.    Treatment usually involves medicines like nitroglycerin. It also usually involves a water pill to get excess fluid out of your system. Some blood pressure medicines may also be used to help with heart failure. Stay away from all rich or salty foods. This is important.    You had CHF in the past and your symptoms are better after treatment here. Therefore, your doctor feels your condition can be safely managed at home.    Congestive Heart Failure can be controlled. Follow these suggestions closely:   Take all medicines as prescribed. Do not skip doses. Take all your medicine bottles when you see your doctor or visit the Emergency Department /Urgent Care.   Weigh yourself every day. Report weight gain of 3-5 pounds to your doctor. Weigh yourself every morning on the same scale. Weigh yourself at the same time of day. Keep a written record. Bring the record when you go to the doctor or Emergency Department / Urgent Care.   Control your salt intake. You should eat a low salt diet. Stay away from foods high in salts like potato chips. Salty foods will make you drink and retain more water. You will gain weight and water may collect in your lungs.   Exercise. Exercise is recommended, but do it gradually. Walking is great exercise. Avoid vigorous (extreme) exercise.   Stop smoking! Smoking is dangerous to your health! Smoking leads to chronic obstructive pulmonary disease (Emphysema). It increases risk of heart disease, cancer and  stroke. If you keep smoking, your lung disease will worsen. There are many medicines and aids to help you stop smoking. Ask your doctor for information.   Follow up. Congestive heart failure is a chronic (ongoing) medical problem. It requires CLOSE follow up with your doctor for monitoring. Following all instructions and taking your medicine may prevent hospitalizations and Emergency Department visits. It may also improve your quality of life. Take all your medicines and weight log with you when you see your doctor.    Congestive heart failure is a serious disease. It happens because your heart does not pump as well as a normal heart. Follow up with your doctor or cardiologist (heart doctor) as soon as possible.    YOU SHOULD SEEK MEDICAL ATTENTION IMMEDIATELY, EITHER HERE OR AT THE NEAREST EMERGENCY DEPARTMENT, IF ANY OF THE FOLLOWING OCCURS:   Repeated shortness of breath or trouble breathing. Significant swelling or a 2-5 pound weight gain within a few days.   Any new chest pain.   Feeling worse at any time, or any new symptoms or concerns.   Feeling weak or lightheaded.   Any palpitations or strange heart beats.   Increased or severe shortness of breath. If you sleep sitting up or in a recliner due to shortness of breath, return here or go to the nearest Emergency Department or follow up with your doctor.

## 2014-12-22 ENCOUNTER — Telehealth (HOSPITAL_BASED_OUTPATIENT_CLINIC_OR_DEPARTMENT_OTHER): Payer: Self-pay

## 2014-12-22 NOTE — Telephone Encounter (Signed)
Automated Post-Discharge Call successful and no triggered alerts identified.  Felicia Cameron, Transitional Telephonic LVN  Phone:  [phone]  Fax:   [phone]  [email or web address]

## 2014-12-23 LAB — GROUP A STREP CULTURE: Strep Group A Culture Result: NO GROWTH

## 2015-02-12 ENCOUNTER — Ambulatory Visit
Admission: RE | Admit: 2015-02-12 | Discharge: 2015-02-12 | Disposition: A | Discharge status: Home / Self Care | Payer: Medicare Other | Source: Ambulatory Visit | Attending: Pulmonary Medicine | Admitting: Pulmonary Medicine

## 2015-02-12 ENCOUNTER — Ambulatory Visit: Payer: Medicare Other | Attending: Pulmonary Medicine | Admitting: Pulmonary Medicine

## 2015-02-12 ENCOUNTER — Ambulatory Visit (HOSPITAL_BASED_OUTPATIENT_CLINIC_OR_DEPARTMENT_OTHER)
Admit: 2015-02-12 | Discharge: 2015-02-15 | Payer: Medicare Other | Source: Ambulatory Visit | Attending: Pulmonary Medicine | Admitting: Pulmonary Medicine

## 2015-02-12 VITALS — BP 135/79 | HR 86 | Temp 98.0°F | Resp 16 | Wt 161.8 lb

## 2015-02-12 DIAGNOSIS — R0600 Dyspnea, unspecified: Secondary | ICD-10-CM | POA: Insufficient documentation

## 2015-02-12 DIAGNOSIS — I272 Other secondary pulmonary hypertension: Principal | ICD-10-CM | POA: Insufficient documentation

## 2015-02-12 DIAGNOSIS — R0602 Shortness of breath: Secondary | ICD-10-CM

## 2015-02-12 DIAGNOSIS — I270 Primary pulmonary hypertension: Principal | ICD-10-CM | POA: Insufficient documentation

## 2015-02-12 DIAGNOSIS — J8410 Pulmonary fibrosis, unspecified: Secondary | ICD-10-CM | POA: Insufficient documentation

## 2015-02-12 DIAGNOSIS — I2721 Secondary pulmonary arterial hypertension: Secondary | ICD-10-CM

## 2015-02-12 NOTE — Progress Notes (Signed)
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     Interval Events:   -Echo and 6 minute walk test completed today. Results below.   - 2L oxygen is stable since her last visit  - No increased SOB, syncope, fevers, chills, CP, LE edema. Takes her Lasix regularly.    - Has had occasional palpitations since ED visit in Dec (3 weeks ago last time)  - No fevers, chills    Patient Active Problem List   Diagnosis   . PAH (PULMONARY ARTERY HYPERTENSION)   . Sjoegren syndrome   . Chest pain   . Bilateral edema of lower extremity   . Pneumonia   . Hypoxia   . Chronic right-sided congestive heart failure (CMS-HCC)   . Moraxella catarrhalis pneumonia (CMS-HCC)   . Systemic lupus erythematosus (CMS-HCC)     Past Medical History   Diagnosis Date   . Pulmonary HTN (CMS-HCC)    . Sjoegren syndrome (CMS-HCC) 07/12/2009     No past surgical history on file.     Allergies   Allergen Reactions   . Allopurinol Hives   . Levaquin Swelling and Other     Severe joint pain     . Ofloxacin Nausea and Vomiting   . Chlorhexidine Itching   . Sulfa Drugs Unspecified     Current Outpatient Prescriptions on File Prior to Visit   Medication Sig Dispense Refill   . ALDACTONE 50 MG OR TABS one tab twice daily     . azelastine (ASTELIN) 0.1 % nasal spray Spray 1 spray into each nostril 2 times daily. Use in each nostril as directed 1 bottle 3   . Cholecalciferol 2000 UNITS CAPS Take 1 capsule by mouth daily.     . digoxin (LANOXIN) 0.25 MG tablet Take 250 mcg by mouth daily.     . Estradiol (DIVIGEL) 0.25 MG/0.25GM GEL Apply 0.25 mg topically nightly.     Marland Kitchen estradiol (ESTRING) 2 MG vaginal ring Insert 1 Ring vaginally Every 3 months. Follow package directions     . FLOLAN 1.5 MG IV SOLR 29ng/kg/min     . FOLIC ACID A999333 MCG OR TABS 1 TABLET DAILY     . furosemide (LASIX) 20 MG tablet  Take 3 tablets by mouth once as needed (edema). 30 tablet 3   . furosemide (LASIX) 20 MG tablet Take 3 tablets by mouth 2 times daily. 180 tablet 3   . medroxyPROGESTERone (PROVERA) 2.5 MG tablet Take 1.25 mg by mouth daily.     . potassium chloride (KLOR-CON M20) 20 MEQ tablet Take 1 tablet by mouth 3 times daily. 90 tablet 6   . predniSONE (STERAPRED DS UNI-PAK) 10 MG Package Take following package instructions 21 tablet 0   . sildenafil (REVATIO) 20 MG tablet Take 4 tablets by mouth 3 times daily. Take 4 tablets three times daily   90 tablet 5   . warfarin (COUMADIN) 5 MG tablet Take 1 tablet by mouth daily. 5mg  x 5days a week 2.5mg  2 days/wk 45 tablet 5     No current facility-administered medications on file prior to visit.      Family History   Problem Relation Age of Onset   . Heart Disease Mother    . Hypertension Mother    . Psychiatry Mother    .  Heart Disease Father      Social History   Substance Use Topics   . Smoking status: Former Smoker     Packs/day: 0.50     Years: 5.00     Quit date: 01/13/1978   . Smokeless tobacco: Never Used   . Alcohol use No       Examination:  Vitals:    02/12/15 1535   BP: 135/79   Pulse: 86   Resp: 16   Temp: 98 F (36.7 C)   SpO2: 96%   Weight: 73.4 kg (161 lb 12.8 oz)       General: The patient is well appearing, sitting comfortably in the chair with no evidence of respiratory distress. Supplemental oxygen in place.   Heart: regular rate and rhythm, no murmurs rubs or gallops, + accentuation of P2; +systolic murmur   Lungs: clear to ausculation and percussion, no wheezes but trace rales at bases   Abdomen: soft, non-tender, normal active bowel sounds.  Extremities: no lower extremity edema.     Laboratory Review:  Lab Results   Component Value Date    WBC 4.7 12/21/2014    HGB 11.8 12/21/2014    HCT 36.7 12/21/2014    PLT 125 12/21/2014    PLT 179 09/12/2008     Lab Results   Component Value Date    NA 141 12/21/2014    K 4.0 12/21/2014    CL 104 12/21/2014    BICARB 26  12/21/2014    BUN 28 12/21/2014    CREAT 1.34 12/21/2014    GLU 126 12/21/2014    Fifty-Six 9.8 12/21/2014     Lab Results   Component Value Date    PT 24.1 12/21/2014    PTT 36.6 12/21/2014    INR 2.2 12/21/2014     New Studies:  Echocardiogram 02/12/2015:   Mod RV and RA enlargement  Pulmonary artery pressure estimated ~66 mmHg    6 Minute Walk Test 02/12/2015:   The patient completed the protocol and walked 428 meters.     Impression/Plan:  [name] is a 65 year old female with a history WHO group I pulmonary arterial hypertension associated with prior anorexigen  use, who is currently New York Heart Association functional class II, with stable symptoms since she was last seen.    - Continue current PAH thearpy including sildenafil 80 mg p.o. t.i.d. as well as intravenous epoprostenol 29 ng/kg per minute.   - PFTs prior to next appointment   - follow up final read of echocardiogram, preliminary results with improved PA pressure  - Return to clinic in approximately 4-6 months    Patient seen and discussed with Dr. [name].

## 2015-02-12 NOTE — Procedures (Signed)
No note

## 2015-02-12 NOTE — Progress Notes (Signed)
Pulmonary Vascular Attending Addendum:  Patient was examined with Dr. Devlin  Labs, imaging and tests reviewed.  Please see above note for full details and plan.    Rashawnda Gaba MD  Pager [phone]

## 2015-02-14 LAB — 2D ECHO WITH IMAGE ENHANCEMENT AGENT IF NECESSARY
IVC Diameter: 1.89 cm
LA Volume Index: 28.5 ml/m²
LV Ejection Fraction: 63 %
PA Pressure: 61.1 mmHg

## 2015-06-20 ENCOUNTER — Encounter (HOSPITAL_COMMUNITY): Payer: Self-pay | Admitting: Pulmonary Medicine

## 2015-06-20 ENCOUNTER — Ambulatory Visit: Payer: Medicare Other | Attending: Pulmonary Medicine | Admitting: Pulmonary Medicine

## 2015-06-20 VITALS — BP 131/72 | HR 87 | Temp 98.0°F | Resp 16 | Ht 62.0 in | Wt 162.0 lb

## 2015-06-20 DIAGNOSIS — I270 Primary pulmonary hypertension: Principal | ICD-10-CM | POA: Insufficient documentation

## 2015-06-20 NOTE — Progress Notes (Signed)
This office note has been dictated.

## 2015-06-22 NOTE — Progress Notes (Signed)
Name:  Diana Chambers, Diana Chambers  MR#:  [id number]  DOB:  1950/11/13  Account #:  [id number]  Date of Adm:  06/20/2015    DATE OF VISIT: 06/19/2015    CHIEF COMPLAINT: Followup for pulmonary arterial hypertension.    HISTORY OF PRESENT ILLNESS: [name] is a 65 year old  female with a history of WHO group I pulmonary arterial  hypertension associated with connective tissue disease and prior  anorexigen use, who returns to clinic for followup. Since she was  last seen, she reports stable cardiopulmonary exercise symptoms,  no new exercise limitations, no worsening chest pains,  palpitations, syncope, presyncope, lower extremity edema or  abdominal distention and also reports no difficulty with her  tunneled catheter and reports stable oxygen requirement.    REVIEW OF SYSTEMS: Negative, other than as specified in HPI.    ALLERGIES  1. LEVAQUIN.  2. LEVOFLOXACIN.  3. CHLORHEXIDINE.  4. SULFA.    CURRENT MEDICATIONS  1. Aldactone 50 mg p.o. b.i.d.  2. Astelin nasal spray.  3. Digoxin 0.25 mg p.o. daily.  4. Intravenous epoprostenol 29 ng/kg per minute.  5. Lasix 20 mg tablets 50 mg p.o. 2 times daily.  6. Potassium chloride 20 mEq p.o. 3 times daily.  7. Sildenafil 80 mg p.o. t.i.d.  8. Coumadin 5 mg 5 days a week, 2.5 mg 2 days a week.    PHYSICAL EXAMINATION  VITAL SIGNS: Blood pressure 131/72, pulse 87, respirations 16,  temperature 98, weight 160 pounds, height 5 feet 2 inches,  saturation 94% on 2 L nasal cannula.  GENERAL: Awake, alert, oriented, no acute distress. Able to speak  in full sentences.  HEENT: Oropharynx clear.  NECK: Flat neck veins. No thyromegaly, no lymphadenopathy.  HEART: Regular rate and rhythm, loud second heart sound, positive  systolic murmur. No S3 noted.  LUNGS: Rales at bases bilaterally.  ABDOMEN: Soft, nondistended, nontender, positive bowel sounds. No  organomegaly noted.  EXTREMITIES: No clubbing, cyanosis, or edema.  SKIN: No rashes.  JOINTS: No swollen joints.    ASSESSMENT: Ms.  Diana Chambers is a 65 year old female with a  history of WHO group I pulmonary arterial hypertension associated  with both connective tissue disease and prior anorexigen use, who  is stable New York Heart Association functional class II/III.    PLAN: We will continue her current PAH targeted therapies,  including intravenous epoprostenol 29 ng/kg per minute as well as  sildenafil 80 mg p.o. t.i.d. Additionally, we will continue her  current doses of diuretics and supplemental oxygen. We will plan  to see her back in clinic in approximately 4-6 months with a  repeat echocardiogram and 6-minute walk test.            [facility], Stormy Sabol MD    DP / KG  D:  06/22/2015   08:58  T:  06/22/2015   09:26  Job #:  [id number]

## 2015-09-07 ENCOUNTER — Telehealth (HOSPITAL_COMMUNITY): Payer: Self-pay | Admitting: Pulmonary Medicine

## 2015-09-07 NOTE — Telephone Encounter (Signed)
Received message from Marlowe Kays, [facility], who spoke to the patient and husband. They would like to try the Flolan Ph12 diluent. They will call if they want to switch back.

## 2015-11-26 ENCOUNTER — Encounter (HOSPITAL_COMMUNITY): Payer: Self-pay | Admitting: Pulmonary Medicine

## 2015-11-26 DIAGNOSIS — I270 Primary pulmonary hypertension: Principal | ICD-10-CM

## 2015-11-26 DIAGNOSIS — R0609 Other forms of dyspnea: Secondary | ICD-10-CM

## 2015-12-12 ENCOUNTER — Ambulatory Visit
Admission: RE | Admit: 2015-12-12 | Discharge: 2015-12-12 | Disposition: A | Discharge status: Home / Self Care | Payer: Medicare Other | Attending: Pulmonary Medicine | Admitting: Pulmonary Medicine

## 2015-12-12 ENCOUNTER — Encounter (HOSPITAL_COMMUNITY): Payer: Self-pay | Admitting: Pulmonary Medicine

## 2015-12-12 ENCOUNTER — Ambulatory Visit: Payer: Medicare Other | Attending: Pulmonary Medicine | Admitting: Pulmonary Medicine

## 2015-12-12 ENCOUNTER — Other Ambulatory Visit (INDEPENDENT_AMBULATORY_CARE_PROVIDER_SITE_OTHER): Payer: Medicare Other | Attending: Pulmonary Medicine

## 2015-12-12 ENCOUNTER — Ambulatory Visit (HOSPITAL_BASED_OUTPATIENT_CLINIC_OR_DEPARTMENT_OTHER)
Admit: 2015-12-12 | Discharge: 2015-12-12 | Disposition: A | Discharge status: Home Health | Payer: Medicare Other | Source: Ambulatory Visit | Attending: Pulmonary Medicine | Admitting: Pulmonary Medicine

## 2015-12-12 VITALS — BP 149/63 | HR 89 | Resp 17 | Ht 62.0 in | Wt 162.0 lb

## 2015-12-12 DIAGNOSIS — T827XXA Infection and inflammatory reaction due to other cardiac and vascular devices, implants and grafts, initial encounter: Secondary | ICD-10-CM | POA: Insufficient documentation

## 2015-12-12 DIAGNOSIS — I270 Primary pulmonary hypertension: Principal | ICD-10-CM | POA: Insufficient documentation

## 2015-12-12 DIAGNOSIS — R0609 Other forms of dyspnea: Secondary | ICD-10-CM | POA: Insufficient documentation

## 2015-12-12 DIAGNOSIS — I071 Rheumatic tricuspid insufficiency: Secondary | ICD-10-CM

## 2015-12-12 LAB — CBC WITH DIFF, BLOOD
ANC-Automated: 3.6 10*3/uL (ref 1.6–7.0)
Abs Lymphs: 1.1 10*3/uL (ref 0.8–3.1)
Abs Monos: 0.3 10*3/uL (ref 0.2–0.8)
Eosinophils: 1 %
Hct: 40.9 % (ref 34.0–45.0)
Hgb: 13.1 gm/dL (ref 11.2–15.7)
Lymphocytes: 22 %
MCH: 25.7 pg — ABNORMAL LOW (ref 26.0–32.0)
MCHC: 32 g/dL (ref 32.0–36.0)
MCV: 80.2 um3 (ref 79.0–95.0)
MPV: 10.3 fL (ref 9.4–12.4)
Monocytes: 6 %
Plt Count: 132 10*3/uL — ABNORMAL LOW (ref 140–370)
RBC: 5.1 10*6/uL (ref 3.90–5.20)
RDW: 17.5 % — ABNORMAL HIGH (ref 12.0–14.0)
Segs: 70 %
WBC: 5.2 10*3/uL (ref 4.0–10.0)

## 2015-12-12 LAB — COMPREHENSIVE METABOLIC PANEL, BLOOD
ALT (SGPT): 21 U/L (ref 0–33)
AST (SGOT): 28 U/L (ref 0–32)
Albumin: 4 g/dL (ref 3.5–5.2)
Alkaline Phos: 112 U/L (ref 35–140)
Anion Gap: 13 mmol/L (ref 7–15)
BUN: 26 mg/dL — ABNORMAL HIGH (ref 8–23)
Bicarbonate: 26 mmol/L (ref 22–29)
Bilirubin, Tot: 0.64 mg/dL (ref <1.2)
Calcium: 10.5 mg/dL (ref 8.5–10.6)
Chloride: 102 mmol/L (ref 98–107)
Creatinine: 1.13 mg/dL — ABNORMAL HIGH (ref 0.51–0.95)
GFR: 48 mL/min
Glucose: 84 mg/dL (ref 70–99)
Potassium: 4.5 mmol/L (ref 3.5–5.1)
Sodium: 141 mmol/L (ref 136–145)
Total Protein: 8.6 g/dL — ABNORMAL HIGH (ref 6.0–8.0)

## 2015-12-12 LAB — PRO BNP, BLOOD: BNPP: 3796 pg/mL — ABNORMAL HIGH (ref 0–899)

## 2015-12-12 MED ORDER — CEPHALEXIN 500 MG OR CAPS
500.0000 mg | ORAL_CAPSULE | Freq: Two times a day (BID) | ORAL | 0 refills | Status: DC
Start: 2015-12-12 — End: 2016-02-26

## 2015-12-12 NOTE — Procedures (Signed)
No note

## 2015-12-13 LAB — 2D ECHO WITH IMAGE ENHANCEMENT AGENT IF NECESSARY
IVC Diameter: 2.21 cm
LA Volume Index: 28.3 ml/m²
LV Ejection Fraction: 71 %
PA Pressure: 82 mmHg

## 2015-12-18 NOTE — Progress Notes (Signed)
This office note has been dictated.

## 2015-12-21 NOTE — Progress Notes (Signed)
Name:  Diana Chambers, Diana Chambers  MR#:  [id number]  DOB:  [birth year]  Account #:  [id number]  Date of Adm:  12/12/2015  Date of Service:  12/12/2015    CHIEF COMPLAINT: Followup for pulmonary arterial hypertension.    HISTORY OF PRESENT ILLNESS: Diana Chambers is a 65 year old  female with a history of WHO Group I pulmonary arterial  hypertension associated with anorexigens and connective tissue  disease who returns to clinic for followup. Since she was last  seen, she reports relatively stable symptoms of shortness of  breath. No chest pain, palpitations, syncope, presyncope, or  lower extremity edema, abdominal distention. She has difficulty  with development of what she describes as warts on her fingers.  No difficulty with lower extremity edema. No recent syncope,  presyncope. No chest pains or palpitations. She does report  generalized worsening fatigue.    REVIEW OF SYSTEMS: Negative other than specified in HPI.    ALLERGIES:  1. ALLOPURINOL.  2. LEVAQUIN.  3. OFLOXACIN.  4. CHLORHEXIDINE.  5. SULFA DRUGS.    PHYSICAL EXAMINATION  VITAL SIGNS: Blood pressure 149/63, pulse 89, respirations 17,  weight 73.5 kg, saturation 96% on 2 L/min nasal cannula.  GENERAL: Awake, alert, oriented, no acute distress, able to speak  in full sentences.  OROPHARYNX: Clear.  NECK: Mild elevation of jugular venous distention. No  thyromegaly, no lymphadenopathy.  HEART: Regular rate and rhythm. Loud second heart sound. Positive  systolic murmur. No S3 noted.  LUNGS: Clear to auscultation bilaterally. No rales, no wheezes.  ABDOMEN: Soft, nondistended, nontender, positive bowel sounds. No  organomegaly.  EXTREMITIES: 1+ edema, bilateral lower extremities.  SKIN: Positive whitish discoloration from treatment for warts  around all of her fingertips and nails.    LABORATORY AND DIAGNOSTIC TESTING: Echocardiogram dated  12/12/2015: TAPSE is equal to 1.3 cm. Right ventricle was  moderately enlarged right ventricular systolic pressure  estimated  at 82 mmHg. There is a trivial pericardial effusion present. The  right atrium is severely dilated. The IVC demonstrates poor  inspiratory collapse consistent with elevated right atrial  pressure. Compared to prior study PA pressures are  higher, RV function is depressed.    Laboratory testing dated 12/12/2015: NT-proBNP is 3796. Sodium  141, creatinine 1.13.    ASSESSMENT: Diana Chambers is a 65 year old female with  history of WHO Group I pulmonary arterial hypertension with  symptoms of mild progression of dyspnea on exertion with  echocardiogram findings concerning for worsening pulmonary  hypertension. Additionally, her walk distance has been declining.  Of note, she also has new area of erythema surrounding tunneled  line infection with some drainage, which may represent an initial  early tunneled line infection.    PLAN: We will check labs today. We will likely schedule her for a  repeat right heart catheterization to evaluate her hemodynamics.  Additionally, we will start her on Keflex for treatment of  potential superficial tunneled line infection. We advised the  patient to contact us and see [place] sooner should her symptoms of  shortness of breath worsen or should she develop a fever or other  symptoms related to more severe line infection.            [facility], Sarafina Puthoff MD    DP / PAG  D:  12/19/2015   15:43  T:  12/21/2015   09:35  Job #:  [id number]

## 2016-02-21 ENCOUNTER — Encounter (HOSPITAL_COMMUNITY): Payer: Self-pay | Admitting: Registered Nurse

## 2016-02-26 ENCOUNTER — Ambulatory Visit
Admission: RE | Admit: 2016-02-26 | Discharge: 2016-02-26 | Disposition: A | Discharge status: Home / Self Care | Payer: Medicare Other | Attending: Pulmonary Medicine | Admitting: Pulmonary Medicine

## 2016-02-26 ENCOUNTER — Encounter (HOSPITAL_BASED_OUTPATIENT_CLINIC_OR_DEPARTMENT_OTHER): Admission: RE | Disposition: A | Discharge status: Home Health | Payer: Self-pay | Attending: Pulmonary Medicine

## 2016-02-26 DIAGNOSIS — M359 Systemic involvement of connective tissue, unspecified: Secondary | ICD-10-CM | POA: Insufficient documentation

## 2016-02-26 DIAGNOSIS — Z881 Allergy status to other antibiotic agents status: Secondary | ICD-10-CM | POA: Insufficient documentation

## 2016-02-26 DIAGNOSIS — R011 Cardiac murmur, unspecified: Secondary | ICD-10-CM | POA: Insufficient documentation

## 2016-02-26 DIAGNOSIS — R609 Edema, unspecified: Secondary | ICD-10-CM | POA: Insufficient documentation

## 2016-02-26 DIAGNOSIS — Z882 Allergy status to sulfonamides status: Secondary | ICD-10-CM | POA: Insufficient documentation

## 2016-02-26 DIAGNOSIS — I2721 Secondary pulmonary arterial hypertension: Principal | ICD-10-CM | POA: Insufficient documentation

## 2016-02-26 DIAGNOSIS — Z888 Allergy status to other drugs, medicaments and biological substances status: Secondary | ICD-10-CM | POA: Insufficient documentation

## 2016-02-26 DIAGNOSIS — Z79899 Other long term (current) drug therapy: Secondary | ICD-10-CM | POA: Insufficient documentation

## 2016-02-26 DIAGNOSIS — T505X5S Adverse effect of appetite depressants, sequela: Secondary | ICD-10-CM | POA: Insufficient documentation

## 2016-02-26 LAB — HEMOGRAM, BLOOD
Hct: 38.2 % (ref 34.0–45.0)
Hgb: 12 gm/dL (ref 11.2–15.7)
MCH: 25.3 pg — ABNORMAL LOW (ref 26.0–32.0)
MCHC: 31.4 g/dL — ABNORMAL LOW (ref 32.0–36.0)
MCV: 80.4 um3 (ref 79.0–95.0)
MPV: 9.7 fL (ref 9.4–12.4)
Plt Count: 119 10*3/uL — ABNORMAL LOW (ref 140–370)
RBC: 4.75 10*6/uL (ref 3.90–5.20)
RDW: 17.2 % — ABNORMAL HIGH (ref 12.0–14.0)
WBC: 4.4 10*3/uL (ref 4.0–10.0)

## 2016-02-26 SURGERY — INSERTION, CATHETER, RIGHT HEART

## 2016-02-26 MED ORDER — FENTANYL CITRATE (PF) 100 MCG/2ML IJ SOLN
INTRAMUSCULAR | Status: AC
Start: 2016-02-26 — End: 2016-02-26
  Filled 2016-02-26: qty 2

## 2016-02-26 MED ORDER — LIDOCAINE HCL 2 % IJ SOLN
INTRAMUSCULAR | Status: AC
Start: 2016-02-26 — End: 2016-02-26
  Filled 2016-02-26: qty 20

## 2016-02-26 MED ORDER — HEPARIN (PORCINE) IN NACL 2-0.9 UNIT/ML-% IJ SOLN
INTRAMUSCULAR | Status: AC
Start: 2016-02-26 — End: 2016-02-26
  Filled 2016-02-26: qty 500

## 2016-02-26 MED ORDER — MIDAZOLAM HCL 2 MG/2ML IJ SOLN
INTRAMUSCULAR | Status: AC
Start: 2016-02-26 — End: 2016-02-26
  Filled 2016-02-26: qty 2

## 2016-02-26 SURGICAL SUPPLY — 11 items
APPLICATOR CHLORAPREP 10.5ML (Kits/Sets/Trays) ×2 IMPLANT
APPLICATOR CHLORAPREP 26ML, ~~LOC~~ (Misc Surgical Supply) ×2
CATHETER SWAN GANZ 7FR HEP. (Drains/Catheter/Tubes/Reservoir) ×2 IMPLANT
COVER PROBE 10X249CM (Drape/Gowns/Gloves/Pack) ×2 IMPLANT
DRAPE TRANSRADIAL FEMORAL ANGIOGRAPHY - MEDLINE (Drape/Gowns/Gloves/Pack) ×2 IMPLANT
ELECTRODE RED DOT REPOS CLOTH (Misc Medical Supply) ×2 IMPLANT
LEADWEAR SINGLE 2.0 SZ 73/5 LRG (Misc Medical Supply) ×2
PACK HEART MANIFOLD RT 2 PORT (Misc Medical Supply) ×2 IMPLANT
SET PRECISION PINNACLE ACCESS 7FR ECHOGENIC NEEDLE SS WIRE 10CM SHEATH (Procedural wires/sheaths/catheters/balloons/dilators) ×2 IMPLANT
TRAY CARDIAC CATH CUSTOM (Kits/Sets/Trays) ×2 IMPLANT
WIRE .025 150CM 3MM (Misc Medical Supply) ×2 IMPLANT

## 2016-02-26 NOTE — Progress Notes (Signed)
Prostacyclin (Flolan/Veletri/Remodulin)      Prostacyclin MCP 333.1      Prostacyclin: Flolan - IV    Prostacyclin Dosing Weight: 86 kg     Dose: 29 ng/kg/min    Concentration:  45000 ng/mL    Rate: Flolan/Veletri/Remodulin IV CADD:  80 mL/24hr    Last Changed: 02/26/2016 at 0600  92.1 mLs remaining at 0800  Changes every 1 days    Has 2 pumps in date?: yes  1). Serial # [id number] Due Date: 06/06/2016  2). Serial # [id number] Due Date: 12/26/2016  Pump Company: [employer], PHARMD

## 2016-02-26 NOTE — Op Note (Signed)
PROCEDURE PERFORMED: Diagnostic right heart catheterization without iNO challenge.  PREOPERATIVE DIAGNOSIS: Pulmonary Arterial Hypertension.  POSTOPERATIVE DIAGNOSIS: Pulmonary Arterial Hypertension.  STAFF: D. Carla Rashad, MD  ANESTHESIA: 8 mL of 2% lidocaine injected locally.  COMPLICATIONS: None.  ESTIMATED BLOOD LOSS: None.    PROCEDURE DESCRIPTION: Informed consent was obtained prior to the procedure. A time-out was performed prior to the procedure, confirming the patient's name, procedure type and procedure location. The patient was prepped and draped in the usual sterile fashion, exposing the right groin. The right femoral vein was visualized with real-time ultrasound guidance, and the skin above the right femoral vein was anesthetized with 8 mL of 2% lidocaine. The right femoral vein was then accessed with 1 stick without arterial puncture under direct real-time ultrasound guidance. Position in the venous system was confirmed under ultrasound, and a 7-French "Precision" micropuncture kit was used to place a 7-French lumen introducer sheath via modified Seldinger technique. A 7-French Swan-Ganz catheter was then advanced through the sheath into the right atrium, right ventricle, pulmonary artery and pulmonary artery occlusion pressure positions. Hemodynamic measurements and saturation measurements were obtained in the usual fashion. Following these measurements, the Swan-Ganz catheter was then removed, followed by removal of the sheath, and hemostasis was achieved. There were no complications from the procedure.    PROCEDURE FINDINGS:   02/26/16  0756   BP: 154/79   Pulse: 87   Resp: 18   Temp: 98.5 F (36.9 C)   SpO2: 93%     Procedure Findings:    Baseline  Units   RA [a/v (mean)] (13)  mmHg   RV [Sys/Dias (RVEDP)] 104/5(17) mmHg   PA [Sys/Dias (Mean)] 97/35 (58)  mmHg   PAOP [End Exp (Mean)]  15 (9) mmHg   Fick CO (CI) 6.85 (3.90) L/min (L/min/m2)   TD CO (CI)  6.17 (3.51) L/min (L/min/m2)   Fick PVR  6.28 WU      TD PVR  6.97 WU   PA sat%  76% %   AO sat%  100% %     IMPRESSION/PLAN:   Severe pulmonary hypertension with stable hemodynamics compared to last [place] 2011.  Continue sildenafil 80 mg PO TID  Continue IV epoprostenol 29 ng/kg/min     [name] M.D.  Pulmonary and Critical Care Attending  Pager [phone]

## 2016-02-26 NOTE — Discharge Instructions (Signed)
Diagnosis and Reason for Procedure    You were seen at [facility] for Right Heart Catheterization    WHAT YOU SHOULD KNOW:  Right heart catheterization is a procedure to check the pressure in your heart and lungs. It is also called a Swan-Ganz or pulmonary artery catheterization. You may need this procedure if you have chest pain, shortness of breath, or decreased oxygen in your body. You may also need this procedure if you need heart surgery or have a heart condition.     What Happened During Your Hospital Stay    The main treatment done for you during this hospitalization was cardiac catheterization.     Instructions for After Discharge    Your diet at home should be a low-salt, low-fat and heart-healthy diet.    Your should limit your activity at home for the short term, as listed below:  -- Avoid significant exertion for the next 7 days.  -- No heavy lifting (more than 10 pounds) for the next 7 days.  -- You can start or restart an exercise routine after 10 days.    Wound care instructions:  -- Do not submerge your catheterization site wound for the next 7 days.  This includes tub baths, pools, hot tubs, or going swimming at the beach.  -- It is OK to shower starting the day after the procedure.  You may use soap and water to clean the wound area.    Your medication list is located on this After Visit Summary in the Current Discharge Medication List section.  Your nurse will review this information with you before you leave the hospital.    It is very important for you to keep a current medication list with you in order to assist your doctors with your medical care.  Bring this After Visit Summary with you to your follow up appointments.    Reasons to Contact a Doctor Urgently    Call 911 or return to the hospital immediately:  -- If you have chest pain or discomfort not relieved by 2 nitro sprays or tablets.  -- If you have chest pain episodes more often or episodes that are more  intense.  -- If you have NEW symptoms of trouble sleeping flat on your back due to shortness of breath.  -- If you experience any bleeding while taking your aspirin.    You should contact either your primary care physician or your hospital cardiologist for any of the following reasons:   -- Increased swelling of the legs, ankles, or feet.  -- Weight gain of 3 or more pounds in 3 days or less.  -- Severe abdominal pain or new back pain after your procedure.  -- Bleeding, swelling, bruising, or increased pain at the puncture site.  -- Fever or increasing redness or drainage at the puncture site.  -- Increasing fatigue or decreasing ability to do usual daily activities.      If you have any questions about your hospital care, your medications, or if you have new or concerning symptoms soon after going home from the hospital, and you need to contact your hospital cardiologist, your hospital cardiologist can be contacted in the following manner:      Dr. Poch @ [phone]  For after hours, call operator at [phone] and ask for physician on call for the service Cardiology.    Once you are able to see your primary care physician (PCP), your PCP will then be responsible for further medication   refills, or appointment referrals.    What Needs to Happen Next After Discharge -- Appointments and Follow Up    Any appointments already scheduled at [place] clinics will be listed in the Future Appointments section at the top of this After Visit Summary.  Any appointments that have been requested, but have not yet been scheduled, will be listed below that under Post Discharge Referrals.    Sometimes tests performed in the hospital do not yet have results by the time a patient goes home.  The following key tests will need to be followed up at your next appointment: None

## 2016-02-26 NOTE — H&P (Signed)
H&P     Date: 02/26/16    Dictating practitioner: Lenna Sciara D. 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 The patient was last seen in clinic by Dr. [name] on 12/11/2016. Since then the patient has been feeling fair. She reports fatigue and ankle swelling. Denies recent hospitalizations or illness. No medication changes; continues sildenafil 80 mg TID and flolan at 29 ng/kg/min. Denies chest pains, dizziness, weakness, syncope, presyncope, cough, or hemoptysis.     REVIEW OF SYSTEMS: Negative other than specified in HPI.    ALLERGIES:  1. ALLOPURINOL.  2. LEVAQUIN.  3. OFLOXACIN.  4. CHLORHEXIDINE.  5. SULFA DRUGS.    PHYSICAL EXAMINATION  VITAL SIGNS: Blood pressure 154/79, pulse 87, temperature 98.5 F (36.9 C), resp. rate 18, height 5\' 2"  (1.575 m), weight 73.9 kg (162 lb 14.4 oz), SpO2 93 %.    GENERAL: Awake, alert, oriented, no acute distress, able to speak  in full sentences.  OROPHARYNX: Clear.  NECK: Mild elevation of jugular venous distention. No  thyromegaly, no lymphadenopathy.  HEART: Regular rate and rhythm. Loud second heart sound. Positive  systolic murmur. No S3 noted.  LUNGS: Clear to auscultation bilaterally. No rales, no wheezes.  ABDOMEN: Soft, nondistended, nontender, positive bowel sounds. No  organomegaly.  EXTREMITIES: 1+ edema, bilateral lower extremities.    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 The patient presents today for Right Heart Catheterization. The risks and benefits of the procedure have been discussed with the patient, all questions have been  answered and they agree to proceed. Consents obtained and in the chart. Stat labs ordered this morning.     Dahlia Client, MS, APRN, ANP-BC, CNS, CCRN  Nurse Practitioner  Division of Pulmonary and Critical Care  Pager:  [phone]

## 2016-05-02 ENCOUNTER — Emergency Department (HOSPITAL_BASED_OUTPATIENT_CLINIC_OR_DEPARTMENT_OTHER): Payer: Medicare Other

## 2016-05-02 ENCOUNTER — Other Ambulatory Visit (HOSPITAL_COMMUNITY): Payer: Self-pay | Admitting: Pharmacist

## 2016-05-02 ENCOUNTER — Inpatient Hospital Stay
Admission: EM | Admit: 2016-05-02 | Discharge: 2016-05-16 | DRG: 314 | Disposition: A | Discharge status: Home / Self Care | Payer: Medicare Other | Attending: Emergency Medicine | Admitting: Emergency Medicine

## 2016-05-02 ENCOUNTER — Observation Stay (HOSPITAL_COMMUNITY): Payer: Medicare Other

## 2016-05-02 DIAGNOSIS — I2721 Secondary pulmonary arterial hypertension: Principal | ICD-10-CM | POA: Diagnosis present

## 2016-05-02 DIAGNOSIS — I272 Pulmonary hypertension, unspecified: Secondary | ICD-10-CM

## 2016-05-02 DIAGNOSIS — E790 Hyperuricemia without signs of inflammatory arthritis and tophaceous disease: Secondary | ICD-10-CM

## 2016-05-02 DIAGNOSIS — J9621 Acute and chronic respiratory failure with hypoxia: Secondary | ICD-10-CM | POA: Diagnosis present

## 2016-05-02 DIAGNOSIS — M940 Chondrocostal junction syndrome [Tietze]: Secondary | ICD-10-CM | POA: Diagnosis present

## 2016-05-02 DIAGNOSIS — K766 Portal hypertension: Secondary | ICD-10-CM

## 2016-05-02 DIAGNOSIS — J8410 Pulmonary fibrosis, unspecified: Secondary | ICD-10-CM

## 2016-05-02 DIAGNOSIS — M3213 Lung involvement in systemic lupus erythematosus: Secondary | ICD-10-CM

## 2016-05-02 DIAGNOSIS — I50813 Acute on chronic right heart failure: Secondary | ICD-10-CM

## 2016-05-02 DIAGNOSIS — R0609 Other forms of dyspnea: Secondary | ICD-10-CM

## 2016-05-02 DIAGNOSIS — M3500 Sicca syndrome, unspecified: Secondary | ICD-10-CM

## 2016-05-02 DIAGNOSIS — M25412 Effusion, left shoulder: Secondary | ICD-10-CM

## 2016-05-02 DIAGNOSIS — E8770 Fluid overload, unspecified: Secondary | ICD-10-CM

## 2016-05-02 DIAGNOSIS — I50810 Right heart failure, unspecified: Secondary | ICD-10-CM | POA: Diagnosis present

## 2016-05-02 DIAGNOSIS — Z452 Encounter for adjustment and management of vascular access device: Secondary | ICD-10-CM

## 2016-05-02 DIAGNOSIS — Z87891 Personal history of nicotine dependence: Secondary | ICD-10-CM

## 2016-05-02 DIAGNOSIS — M25512 Pain in left shoulder: Secondary | ICD-10-CM

## 2016-05-02 DIAGNOSIS — R2243 Localized swelling, mass and lump, lower limb, bilateral: Secondary | ICD-10-CM

## 2016-05-02 DIAGNOSIS — R600 Localized edema: Secondary | ICD-10-CM

## 2016-05-02 DIAGNOSIS — M79641 Pain in right hand: Secondary | ICD-10-CM

## 2016-05-02 DIAGNOSIS — Z8249 Family history of ischemic heart disease and other diseases of the circulatory system: Secondary | ICD-10-CM

## 2016-05-02 DIAGNOSIS — M359 Systemic involvement of connective tissue, unspecified: Secondary | ICD-10-CM

## 2016-05-02 DIAGNOSIS — R918 Other nonspecific abnormal finding of lung field: Secondary | ICD-10-CM

## 2016-05-02 DIAGNOSIS — M329 Systemic lupus erythematosus, unspecified: Secondary | ICD-10-CM | POA: Diagnosis present

## 2016-05-02 DIAGNOSIS — L659 Nonscarring hair loss, unspecified: Secondary | ICD-10-CM

## 2016-05-02 LAB — URINALYSIS WITH CULTURE REFLEX, WHEN INDICATED
Bilirubin: NEGATIVE
Glucose: NEGATIVE
Hyaline Cast: >5 — AB (ref 0–?)
Ketones: NEGATIVE
Leuk Esterase: NEGATIVE
Nitrite: NEGATIVE
Specific Gravity: 1.009 (ref 1.002–1.030)
Urobilinogen: NEGATIVE
pH: 6 (ref 5.0–8.0)

## 2016-05-02 LAB — HEPATITIS C AB, BLOOD: Hepatitis C Ab: NONREACTIVE

## 2016-05-02 LAB — MDIFF
Bands: 1 % (ref 0–15)
Immature Granulocytes Absolute Manual: 0.1 10*3/uL (ref 0.0–0.1)
Metamyelocytes: 3 %
Number of Cells Counted: 114
Plt Est: DECREASED

## 2016-05-02 LAB — COMPREHENSIVE METABOLIC PANEL, BLOOD
ALT (SGPT): 16 U/L (ref 0–33)
AST (SGOT): 17 U/L (ref 0–32)
Albumin: 3.8 g/dL (ref 3.5–5.2)
Alkaline Phos: 169 U/L — ABNORMAL HIGH (ref 35–140)
Anion Gap: 9 mmol/L (ref 7–15)
BUN: 33 mg/dL — ABNORMAL HIGH (ref 8–23)
Bicarbonate: 27 mmol/L (ref 22–29)
Bilirubin, Tot: 0.6 mg/dL (ref <1.2)
Calcium: 10.6 mg/dL (ref 8.5–10.6)
Chloride: 106 mmol/L (ref 98–107)
Creatinine: 1.08 mg/dL — ABNORMAL HIGH (ref 0.51–0.95)
GFR: 51 mL/min
Glucose: 83 mg/dL (ref 70–99)
Potassium: 4.5 mmol/L (ref 3.5–5.1)
Sodium: 142 mmol/L (ref 136–145)
Total Protein: 8.4 g/dL — ABNORMAL HIGH (ref 6.0–8.0)

## 2016-05-02 LAB — VENOUS BLOOD GAS
BE, Ven: -0.4 mmol/L (ref ?–2.4)
Flow Rate, Ven: 4 L/min
HCO3, Ven: 24 mmol/L (ref 23–28)
O2 Sat, Ven (Est): 84.1 %
Temp, Ven: 36.9 'C
pCO2, Ven (T): 45 mmHg (ref 40–52)
pCO2, Ven (Uncorr): 45 mmHg (ref 40–52)
pH, Ven (T): 7.36 (ref 7.33–7.40)
pH, Ven (Uncorr): 7.36 (ref 7.33–7.40)
pO2, Ven (T): 51 mmHg — ABNORMAL HIGH (ref 25–44)
pO2, Ven (Uncorr): 51 mmHg — ABNORMAL HIGH (ref 25–44)

## 2016-05-02 LAB — CBC WITH DIFF, BLOOD
ANC-Manual Mode: 2.9 10*3/uL (ref 1.6–7.0)
Abs Lymphs: 1.4 10*3/uL (ref 0.8–3.1)
Abs Monos: 0.3 10*3/uL (ref 0.2–0.8)
Hct: 38.2 % (ref 34.0–45.0)
Hgb: 11.9 gm/dL (ref 11.2–15.7)
Lymphocytes: 30 %
MCH: 24.9 pg — ABNORMAL LOW (ref 26.0–32.0)
MCHC: 31.2 g/dL — ABNORMAL LOW (ref 32.0–36.0)
MCV: 80.1 um3 (ref 79.0–95.0)
MPV: 9 fL — ABNORMAL LOW (ref 9.4–12.4)
Monocytes: 6 %
Plt Count: 115 10*3/uL — ABNORMAL LOW (ref 140–370)
RBC: 4.77 10*6/uL (ref 3.90–5.20)
RDW: 17.2 % — ABNORMAL HIGH (ref 12.0–14.0)
Segs: 60 %
WBC: 4.7 10*3/uL (ref 4.0–10.0)

## 2016-05-02 LAB — CPK-CREATINE PHOSPHOKINASE, BLOOD: CPK: 26 U/L (ref 0–175)

## 2016-05-02 LAB — C-REACTIVE PROTEIN, BLOOD: CRP: 1.2 mg/dL — ABNORMAL HIGH (ref <0.5)

## 2016-05-02 LAB — CKMB+INDEX (NO CPK), BLOOD: CK-MB: 2 ng/mL (ref 0.0–2.8)

## 2016-05-02 LAB — PRO BNP, BLOOD: BNPP: 10379 pg/mL — ABNORMAL HIGH (ref 0–899)

## 2016-05-02 LAB — PROTHROMBIN TIME, BLOOD
INR: 3.3
PT,Patient: 35.3 s — ABNORMAL HIGH (ref 9.7–12.5)

## 2016-05-02 LAB — MAGNESIUM, BLOOD: Magnesium: 2.2 mg/dL (ref 1.6–2.4)

## 2016-05-02 LAB — PHOSPHORUS, BLOOD: Phosphorous: 2.9 mg/dL (ref 2.7–4.5)

## 2016-05-02 LAB — SED RATE, BLOOD: Sed Rate: 36 mm/hr — ABNORMAL HIGH (ref 0–30)

## 2016-05-02 LAB — TROPONIN T GEN 5 W/REFLEX TO CK/CKMB: Troponin T Gen 5 w/Reflex CK/CKMB: 59 ng/L — ABNORMAL HIGH (ref <14)

## 2016-05-02 LAB — PROCALCITONIN, BLOOD: Procalcitonin: 0.1 ng/mL — AB (ref <0.08)

## 2016-05-02 MED ORDER — AZELASTINE HCL 137 MCG/SPRAY NA SOLN
1.0000 | Freq: Two times a day (BID) | NASAL | Status: DC
Start: 2016-05-02 — End: 2016-05-16
  Administered 2016-05-03 – 2016-05-16 (×27): 1 via NASAL
  Filled 2016-05-02: qty 30

## 2016-05-02 MED ORDER — FUROSEMIDE 10 MG/ML IJ SOLN
60.0000 mg | Freq: Two times a day (BID) | INTRAMUSCULAR | Status: DC
Start: 2016-05-02 — End: 2016-05-06
  Administered 2016-05-02 – 2016-05-06 (×8): 60 mg via INTRAVENOUS
  Filled 2016-05-02 (×8): qty 6

## 2016-05-02 MED ORDER — PROSTACYCLIN ICE PACK (~~LOC~~)
Freq: Four times a day (QID) | Status: DC
Start: 2016-05-02 — End: 2016-05-16
  Administered 2016-05-02: 19:00:00
  Administered 2016-05-03: 1
  Administered 2016-05-03 – 2016-05-08 (×20)
  Administered 2016-05-08: 2
  Administered 2016-05-08: 12:00:00
  Administered 2016-05-08: 2
  Administered 2016-05-09: 12:00:00
  Administered 2016-05-09: 2
  Administered 2016-05-09: 18:00:00
  Administered 2016-05-09 – 2016-05-10 (×2): 2
  Administered 2016-05-10: 06:00:00
  Administered 2016-05-10: 2
  Administered 2016-05-10
  Administered 2016-05-11 – 2016-05-12 (×5): 2
  Administered 2016-05-12 (×2)
  Administered 2016-05-12 – 2016-05-13 (×2): 2
  Administered 2016-05-13 (×2)
  Administered 2016-05-13: 2
  Administered 2016-05-14 – 2016-05-15 (×8)
  Administered 2016-05-16: 1
  Administered 2016-05-16 (×2)
  Filled 2016-05-02 (×2): qty 1

## 2016-05-02 MED ORDER — GLYCINE DILUENT IV SOLN
29.0000 ng/kg/min | INTRAVENOUS | Status: DC
Start: 2016-05-02 — End: 2016-05-16
  Administered 2016-05-02 – 2016-05-16 (×27): 29 ng/kg/min via INTRAVENOUS
  Filled 2016-05-02 (×18): qty 4.5

## 2016-05-02 MED ORDER — SODIUM CHLORIDE 0.9 % IJ SOLN (CUSTOM)
3.0000 mL | INTRAMUSCULAR | Status: DC | PRN
Start: 2016-05-02 — End: 2016-05-16
  Administered 2016-05-06 – 2016-05-15 (×2): 3 mL via INTRAVENOUS

## 2016-05-02 MED ORDER — SPIRONOLACTONE 25 MG OR TABS
50.0000 mg | ORAL_TABLET | Freq: Every day | ORAL | Status: DC
Start: 2016-05-03 — End: 2016-05-16
  Administered 2016-05-03 – 2016-05-16 (×14): 50 mg via ORAL
  Filled 2016-05-02 (×14): qty 2

## 2016-05-02 MED ORDER — PROSTACYCLIN CASSETTE CHANGE (~~LOC~~)
Freq: Every day | Status: DC
Start: 2016-05-02 — End: 2016-05-16
  Administered 2016-05-03 – 2016-05-16 (×14)
  Filled 2016-05-02: qty 1

## 2016-05-02 MED ORDER — SILDENAFIL CITRATE 20 MG OR TABS
80.0000 mg | ORAL_TABLET | Freq: Three times a day (TID) | ORAL | Status: DC
Start: 2016-05-02 — End: 2016-05-16
  Administered 2016-05-02 – 2016-05-16 (×42): 80 mg via ORAL
  Filled 2016-05-02 (×2): qty 4

## 2016-05-02 MED ORDER — FOLIC ACID 1 MG OR TABS
0.5000 mg | ORAL_TABLET | Freq: Every day | ORAL | Status: DC
Start: 2016-05-03 — End: 2016-05-16
  Administered 2016-05-03 – 2016-05-16 (×14): 0.5 mg via ORAL
  Filled 2016-05-02 (×14): qty 1

## 2016-05-02 MED ORDER — PROSTACYCLIN TUBING (~~LOC~~)
Status: DC
Start: 2016-05-02 — End: 2016-05-16
  Administered 2016-05-05 – 2016-05-16 (×6)
  Filled 2016-05-02: qty 1

## 2016-05-02 MED ORDER — SODIUM CHLORIDE 0.9 % IJ SOLN (CUSTOM)
3.0000 mL | Freq: Three times a day (TID) | INTRAMUSCULAR | Status: DC
Start: 2016-05-02 — End: 2016-05-16
  Administered 2016-05-02 – 2016-05-16 (×36): 3 mL via INTRAVENOUS

## 2016-05-02 MED ORDER — SODIUM CHLORIDE 0.9% TKO INFUSION
INTRAVENOUS | Status: DC | PRN
Start: 2016-05-02 — End: 2016-05-16

## 2016-05-02 MED ORDER — PROSTACYCLIN BATTERIES - PUMP (~~LOC~~)
Status: DC
Start: 2016-05-05 — End: 2016-05-16
  Administered 2016-05-05 – 2016-05-12 (×2)
  Filled 2016-05-02: qty 1

## 2016-05-02 MED ORDER — HEPARIN SODIUM (PORCINE) PF 5000 UNIT/0.5ML IJ SOLN
5000.0000 [IU] | Freq: Two times a day (BID) | INTRAMUSCULAR | Status: DC
Start: 2016-05-02 — End: 2016-05-02

## 2016-05-02 NOTE — ED Notes (Signed)
Pt states she has had increasing sob past few days. Non-productive + cough, endorses low grade fevers. No chest pain. Pt normally on 2L O2 NC, has increased oxygen to 4L.  Pt lives in [place] , is on [place]. Pt drove down yesterday to come for evaluation.

## 2016-05-02 NOTE — ED Notes (Signed)
Assisting primary RN Adalberto Ill, RN)-  23 G butterfly needle to LAC x 1 attempt  Blood drawn , labeled and sent to lab  Gauze applied to site to control bleeding.

## 2016-05-02 NOTE — ED Notes (Signed)
ECG completed in triage and handed to Dr. Vallarie Mare with prior from [facility]. Copy placed in triage bin.

## 2016-05-02 NOTE — Plan of Care (Signed)
Problem: Breathing Pattern - Ineffective  Goal: Arterial blood gas values and/or saturation return to baseline  Outcome: Progressing toward goal, anticipate improvement over: >48 hours  Patient admitted from ED via [place].  Pt able to ambulate from gurney to restroom and sat up in chair independently with stable gait.  No s/s of distress.  Patient states she uses 2L-NC or RA at home, but came to ED because of increased SOB.  Patient's O2 sat is 91-92% on 4L-NC, maintains >93% on 4.5L.  Patient & husband oriented to room and unit routines.  Telemetry placed-NSR-ST on monitor.  VSS.  Flolan running via left chest broviac.  See emar for rate.  Patient sitting up eating dinner at this time.  Will endorse care to NOC-RN for continued care.

## 2016-05-02 NOTE — Progress Notes (Signed)
Encounter opened to query outside prescription records for medication reconciliation purposes.

## 2016-05-02 NOTE — ED Notes (Signed)
Handoff report to Short RN [facility]

## 2016-05-02 NOTE — ED Notes (Addendum)
The patient is on a Floan CADD pump. Pump is "RUN" mode upon arrival.  [place] volume is: 11.2 mls  Given: 87.85 mls  The pump delivers in 24 hours is : 80 ml per 24 hrs  The dosage in nanograms/per/kg is: 29  Medication was last changed 0930 yesterday  How many bottles used to mix Flolan: 3  The bottle colors are: red  Pt's weight is:   Ship broker paged  John Giovanni Charge RN informed.  Verified settings and that pump is in RUN mode with B. Jimmye Norman, RN and John Giovanni RN  MD informed of patient's arrival.  The pt does have a back up pump with them.

## 2016-05-02 NOTE — H&P (Signed)
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  She was in her USOH when 1 week PTA patient noticed leg and abdominal swelling and dyspnea mainly with exertion.  She attempted to increase her furosemide to tid dosing without much increase in her UOP or improvement in her syx.  Over the course of 1 week she has also noticed fevers (with rise in temp from baseline of 97) to 99.6 deg F.  She has had some mild intermittent cough, and endorses pleuritic chest pain also 1 week in duration.  She thinks she may have had some fleeting joint pains.  No palps, lightheadedness or syncope.  Reports CVC has been working Remainder ROS was unremarkable.     WRT to [facility] she is on dual therapy with sildenafil 80 mg tid and epoprostenol 29 ng/kg min  She was previously on bosentan in the past but appears this was stopped due to transaminitis.      PMHx:  As above    Current Facility-Administered Medications   Medication    epoprostenol (FLOLAN) 45,000 ng/mL in glycine diluent 100 mL infusion    furosemide (LASIX) injection 60 mg    Prostacyclin Cassette Change    Prostacyclin Ice Pack    Prostacyclin Tubing    [START ON 05/05/2016] Prostacylin Batteries     Current Outpatient Prescriptions   Medication Sig    ALDACTONE 50 MG OR TABS one tab twice daily    azelastine (ASTELIN) 0.1 % nasal spray Spray 1 spray into each nostril 2 times daily. Use in each nostril as directed    Cholecalciferol 2000 UNITS CAPS Take 1 capsule by mouth daily.    digoxin (LANOXIN) 0.25 MG tablet Take 250 mcg by mouth daily.    Estradiol (DIVIGEL) 0.25 MG/0.25GM GEL Apply 0.25 mg topically nightly.    estradiol (ESTRING) 2 MG vaginal ring Insert 1 Ring vaginally Every 3 months. Follow package directions    FLOLAN  1.5 MG IV SOLR 41PF/XT/KWI    FOLIC ACID 097 MCG OR TABS 1 TABLET DAILY    furosemide (LASIX) 20 MG tablet Take 3 tablets by mouth 2 times daily.    medroxyPROGESTERone (PROVERA) 2.5 MG tablet Take 1.25 mg by mouth daily.    potassium chloride (KLOR-CON M20) 20 MEQ tablet Take 1 tablet by mouth 3 times daily.    sildenafil (REVATIO) 20 MG tablet Take 4 tablets by mouth 3 times daily. Take 4 tablets three times daily      warfarin (COUMADIN) 5 MG tablet Take 1 tablet by mouth daily. 85m x 5days a week 2.536m2 days/wk     Allergies   Allergen Reactions    Allopurinol Hives    Levaquin Swelling and Other     Severe joint pain      Ofloxacin Nausea and Vomiting    Chlorhexidine Itching    Sulfa Drugs Unspecified     No past surgical history on file.  Family History   Problem Relation Age of Onset    Heart Disease Mother     Hypertension Mother     Psychiatry Mother     Heart Disease Father      Social History     Social History  Marital status: Married     Spouse name: N/A    Number of children: N/A    Years of education: N/A     Social History Main Topics    Smoking status: Former Smoker     Packs/day: 0.50     Years: 5.00     Quit date: 01/13/1978    Smokeless tobacco: Never Used    Alcohol use No    Drug use: Not on file    Sexual activity: Not on file     Social Activities of Daily Living Present    Not on file     Social History Narrative       Review of Systems: As documented above, otherwise unremarkable    Physical Exam:  BP 147/70 (BP Location: Left arm, BP Patient Position: Semi-Fowlers)   Pulse 89   Temp 98.1 F (36.7 C)   Resp 22   Ht _0  (1.575 m)   Wt 73.6 kg (162 lb 3.2 oz)   SpO2 98%   BMI 29.67 kg/m2    Chronically ill appearing 66 yo elderly Cauc F, nad  Awake, alert, fully oriented  Mild conversational dyspnea  Anicteric sclera, no conjunctival pallor  JVD mildly elevated just below the mandible, trachea midline  RRR, S1/S2, faint non-radiating systolic murmur LSB, no  gallop  Occasional scattered rales worst in the L base  Abd soft, mildly distended, + bowel sounds  Ext WWP, bilat pitting edema to above the knee  Skin flushed, CVC catheter dressing c/d/i  No joint swelling or obvious effusions    Results Review:  Lab Results   Component Value Date    WBC 4.7 05/02/2016    RBC 4.77 05/02/2016    HGB 11.9 05/02/2016    HCT 38.2 05/02/2016    MCV 80.1 05/02/2016    MCHC 31.2 05/02/2016    RDW 17.2 05/02/2016    PLT 115 05/02/2016    PLT 179 09/12/2008    MPV 9.0 05/02/2016     Lab Results   Component Value Date    NA 142 05/02/2016    K 4.5 05/02/2016    CL 106 05/02/2016    BICARB 27 05/02/2016    BUN 33 05/02/2016    CREAT 1.08 05/02/2016    GLU 83 05/02/2016    [place] 10.6 05/02/2016     Lab Results   Component Value Date    AST 17 05/02/2016    ALT 16 05/02/2016    ALK 169 05/02/2016    TP 8.4 05/02/2016    ALB 3.8 05/02/2016    TBILI 0.60 05/02/2016    DBILI 0.1 06/06/2011         CXR shows interval loss L hemidiaphragm c/w infiltrate vs atelectasis:      RHC 02/2016:      Assessment and Plan:  66F with SLE and PAH presenting with dyspnea and acute on chronic hypoxemic respiratory failure.  Found to be in acute decompensated RH failure with LE edema and abdominal distension.  Low suspicion for infection though needs blood cultures to r/o line infection, a UA, and an RPNA.  Also needs eval for cocci given her syx and the fact that she [place] in an endemic area.  Finally, wondering if her SLE may be flaring (though this would be atypical for her), especially given the low grade temps and pleurisy.  This may be causing some splinting and worsening LLL atelectasis +/- small effusion.      Admit to obs.  IV lasix BID to goal negative fluid balance.    Continue epo at home dose and sildenafil.  Check blood cultures, UA, RPNA, and cocci serology.    Check ESR, CRP, C3, C4.    Check LE duplexes.  Follow UOP and electrolytes.  Low Na diet.  SQH.  Full Code.  Floor lvl care with tele.       Patient seen and discussed with Dr Cristal Deer.    Ocie Doyne, M.D.  Fellow  Department of Pulmonary and Critical Care Medicine  [phone]        ATTENDING HISTORY AND PHYSICAL ATTESTATION    Subjective    Chief complaint:  DOE, "lung and heart pain", bloating    History of present illness:  66 y/o F with known PAH from prior anorexigen use and CTD (SLE?) now here for a few days of worse DOE, above mentioned pains, LEx edema followed by abdominal bloating and no response to home increased diuretic dose. Pt had a Temp of 99 at home. Drove 8 hours to get to ED here, but did not come in last night and stayed in a hotel. No chills, no other clear Sx of infection.     See fellow history and physical for further details of the patient's history.    Objective  NAD, RRR +M, CTA on R, LLL crackles (chronic per pt and husband), NT mild D, +JVD, no edema, L tunneled CVC site is CDI  I have examined the patient and concur with the fellow exam.    Assessment and Plan  66 y/o F with known PAH from prior anorexigen use and CTD (SLE?) now here for a few days of worse DOE, above mentioned pains, LEx edema followed by abdominal bloating and no response to home increased diuretic dose. Will admit under OBS for below  1. PAH with possible volume overload:  - Try IV Lasix  - Continue IV Epo and PO revatio  - Follow Cr and UO  - Recent RHC with preserved CO  - Check proBNP  2. DOE:  - Likely in the setting of #1  - Infection unlikely  - Check blood Cx for occult entral line infection  - RPNA  - No sputum for Cx  - No WBC  - Check procalcitonin, Cocci (lives in [place])  3. CTD:  - Positive DSDNA, ?SLE  - Check C3, C4 and ESR, CRP to assess for flare  - If in flare consider steroids    I agree with the fellow care plan.  See the fellow and physical for further details.    Francesca Jewett, MD  Pulmonary and Critical Care  PID [id number] / Pager [phone]

## 2016-05-02 NOTE — Progress Notes (Addendum)
Prostacyclin (Flolan/Veletri/Remodulin)      Prostacyclin MCP 333.1      Prostacyclin: Flolan - IV    Prostacyclin Dosing Weight: 86 kg     Dose: 29 ng/kg/min    Concentration:  45,000 ng/mL (3 x 1.5 mg vials / 100 mL)    Rate: Flolan/Veletri/Remodulin IV CADD:  80 mL/24hr    Last Changed: Yesterday 4/19 at 9:30 AM  10.6 mLs remaining at 12:06 PM  Changes every 1 days    Has 2 pumps in date?: yes  1). Serial # [id number] Due Date: 12/26/16  2). Serial # [id number] Due Date: 06/06/16  Pump Company: [facility]    Radene Gunning, PharmD, BCPS  Staff Pharmacist  [place] [facility]    Addendum 05/02/16 @1230   On review of patient's Flolan history, determined that patient is on the pH 12 diluent (rather than glycine diluent).  Discussed with Dr. Cristal Deer MD, and discussed with pharmacist colleague expert in [place], and called [facility].     Reviewed package insert information. [email or web address]   I spoke with Almyra Free at [employer]. She provided instructions that when switching between the glycine to pH 12 diluents (same drug/dose Folan), the "pharmacy instructs patients to change cassettes as they normally would."   Informed Dr. Cristal Deer of this, MD aware patient to continue on own drug while in the ED and if admitted, would change to hospital supply Flolan prepared in glycine diluent.      I informed patient and husband who stated the patient had tolerated the switch from the glycine diluent to new pH 12 diluent ~6 months prior with no issue. Informed the patient that because the Flolan would be prepared in glycine diluent, that it would require ice packs. Patient aware and in agreement.     Sampson Si, PharmD, PhD, BCPS  Staff Pharmacist

## 2016-05-02 NOTE — Interdisciplinary (Signed)
Patient stated that she changed her own Flolan cassette in the ED around noon today.  This was not reported to me by ED report.  Endorsed this information to [place] for [place] shift.

## 2016-05-02 NOTE — ED Notes (Signed)
Pharmacist and MD at bedside.

## 2016-05-02 NOTE — ED Floor Report (Addendum)
ED to IP Handoff    Report created by Oswaldo Milian, RN at 3:58 PM 05/02/2016.     HANDOFF REPORT UPDATE/CHANGES (changes in patient status/care/events prior to transfer)  By who:  Time:   Additional information:   Pt changed own Flolan cassette at 1225, had 99.1 volume reserve.                                                                                                                                                    Diana Chambers is a 66 year old female.    Brief Summary of ED Visit (to include focused assessment and neuro status):  Pt AxOx4, ambulatory, pt lives in [place] , is on Flolan, drove last night to be seen here for increasing sob on exertion, + cough and Bilateral LEE. Pt on home O2 2L, having to increase to 4L for comfort. + low grade fevers at home, although a febrile here.     RN shift assessment exceptions to WDL: sob, edema  Any significant events and interventions with responses:  Sob, pt on 4L O2, maintains O2 sats 97%, no distress    Radiologic studies not completed: none  (None unless otherwise noted)    Chief Complaint   Patient presents with    Shortness of Breath     int mid chest pain non-radiating since 4/17. worsening sob. +doe. also reports low grad temp at home. 2 lpm of oxygen at home. currently on 4 lpm at 92%       Admitted for: Volume overload    Code Status:  Please refer to In-pt admitting doctors orders     Level of Care: Med/surg tele     Is patient septic? no If yes, complete below:    BC x 2 drawn? no  If No explain:  na    Repeat lactate needed? no  If Yes, when is it due?  na    All initial antibiotics given?  no  If No, explain:  na    Amount of IV fluids received na ml    Is patient on Heparin? no If yes, complete below:     Time Heparin bolus was given: na    Additional drips patient is on:     Cardiac rhythm: NSR    Oxygen Delivery: 4L NC    Past Medical History:   Diagnosis Date    Pulmonary HTN     Sjoegren syndrome (CMS-HCC) 07/12/2009       No  past surgical history on file.    Allergies: Allopurinol; Levaquin; Ofloxacin; Chlorhexidine; and Sulfa drugs    ED Fall Risk: (!) Yes    Skin issues:  no    >> If yes, note areas of skin breakdown. See appropriate photos.      Ambulatory:  yes    Sitter  needed: no    Suicide Risk:  no    Isolation Required: no     >> If yes , what type of isolation: na    Is patient in custody?  no    Is patient in restraints? no    Vitals:    05/02/16 1159 05/02/16 1301 05/02/16 1521 05/02/16 1703   BP: 151/91 147/70 135/98    BP Location:  Left arm Left arm Left arm   BP Patient Position:   Semi-Fowlers Semi-Fowlers   Pulse: 98 86 88 94   Resp: 18 24 20 22    Temp: 98.4 F (36.9 C)      SpO2: 93% 97% 96% 97%   Weight: 73.6 kg (162 lb 3.2 oz)      Height: 5\' 2"  (1.575 m)          Tubes Collected: (S) Green PST, Lavender, Blue (green x 2) (05/02/16 1515 : Crisostomo, Erin R, RN)    Lab Results   Component Value Date    WBC 4.7 05/02/2016    RBC 4.77 05/02/2016    HGB 11.9 05/02/2016    HCT 38.2 05/02/2016    MCV 80.1 05/02/2016    MCHC 31.2 (L) 05/02/2016    RDW 17.2 (H) 05/02/2016    PLT 115 (L) 05/02/2016    MPV 9.0 (L) 05/02/2016       Lab Results   Component Value Date    NA 142 05/02/2016    K 4.5 05/02/2016    CL 106 05/02/2016    BICARB 27 05/02/2016    BUN 33 (H) 05/02/2016    CREAT 1.08 (H) 05/02/2016    GLU 83 05/02/2016    [place] 10.6 05/02/2016       Lab Results   Component Value Date    PHOS 2.9 05/02/2016    MG 2.2 05/02/2016       Lab Results   Component Value Date    CPK 26 05/02/2016    [place] 2.0 05/02/2016    TROPONIN 59 (H) 05/02/2016       Lab Results   Component Value Date    PH 7.36 05/02/2016    PCO2 45 05/02/2016    UNPH 7.36 05/02/2016    UNPCO2 45 05/02/2016       No results found for this visit on 05/02/16.      Patient Lines/Drains/Airways Status    Active PICC Line / CVC Line / PIV Line / Drain / Airway / Intraosseous Line / Epidural Line / ART Line / Line Type / Wound     Name: Placement date: Placement  time: Site: Days:    Peripheral IV - 20 G Forearm 05/02/16   1235   Forearm   less than 1                    ED Handoff Report is ready for review.  Admitting RN may reach Emergency Department RN, Oswaldo Milian, RN, at [phone] with any questions.

## 2016-05-02 NOTE — ED MD Progress Note (Signed)
Workup Review   d/w pulm vasc attending  Will add procal and bnpp  Admit to tele  No abx for now  No severe sepsis present, findings are due to: pulm htn

## 2016-05-02 NOTE — ED Provider Notes (Signed)
Emergency Department Note  [facility] electronic medical record reviewed for pertinent medical history.     Nursing Triage Note:   Chief Complaint   Patient presents with    Shortness of Breath     int mid chest pain non-radiating since 4/17. worsening sob. +doe. also reports low grad temp at home. 2 lpm of oxygen at home. 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  Patient reports that she has had to increase her home oxygen to 4 L, still satting at 92%.  Has been having low-grade fevers at home.  Has had a cough, with some chest pain when coughing.  Denies any chest pain at rest or on exertion.  Reports that she has been having worsening dyspnea on exertion as well.  Patient has had worsening peripheral edema, typically takes Lasix 60 BID but reports she has been taking extra doses due to worsening peripheral edema. Denies any orthopnea or PND.    HPI    Past Medical History:   Diagnosis Date    Pulmonary HTN     Sjoegren syndrome (CMS-HCC) 07/12/2009       No past surgical history on file.    Family History:      Family History   Problem Relation Age of Onset    Heart Disease Mother     Hypertension Mother     Psychiatry Mother     Heart Disease Father        Social History:      Social History   Substance Use Topics    Smoking status: Former Smoker     Packs/day: 0.50     Years: 5.00     Quit date: 01/13/1978    Smokeless tobacco: Never Used    Alcohol use No       Medications:   Prior to Admission Medications   Prescriptions Last Dose Informant Patient Reported? Taking?   ALDACTONE 50 MG OR TABS   Yes No   Sig: one tab twice daily   Cholecalciferol 2000 UNITS CAPS   Yes No   Sig: Take 1 capsule by mouth daily.   Estradiol (DIVIGEL) 0.25 MG/0.25GM GEL   Yes No   Sig: Apply 0.25 mg topically nightly.     FLOLAN 1.5 MG IV SOLR   Yes No   Sig: 06CB/JS/EGB   FOLIC ACID 151 MCG OR TABS   Yes No   Sig: 1 TABLET DAILY   azelastine (ASTELIN) 0.1 % nasal spray   No No   Sig: Spray 1 spray into each nostril 2 times daily. Use in each nostril as directed   digoxin (LANOXIN) 0.25 MG tablet   Yes No   Sig: Take 250 mcg by mouth daily.   estradiol (ESTRING) 2 MG vaginal ring   Yes No   Sig: Insert 1 Ring vaginally Every 3 months. Follow package directions   furosemide (LASIX) 20 MG tablet   No No   Sig: Take 3 tablets by mouth 2 times daily.   medroxyPROGESTERone (PROVERA) 2.5 MG tablet   Yes No   Sig: Take 1.25 mg by mouth daily.   potassium chloride (KLOR-CON M20) 20 MEQ tablet   No No   Sig: Take 1 tablet by mouth 3 times  daily.   sildenafil (REVATIO) 20 MG tablet   No No   Sig: Take 4 tablets by mouth 3 times daily. Take 4 tablets three times daily     warfarin (COUMADIN) 5 MG tablet   No No   Sig: Take 1 tablet by mouth daily. 5mg  x 5days a week 2.5mg  2 days/wk      Facility-Administered Medications: None       Allergies: Allopurinol; Levaquin; Ofloxacin; Chlorhexidine; and Sulfa drugs    Review of Systems:   Constitutional:+low grade fever  Eyes: Negative for photophobia, pain and redness.   ENT: Negative for runny nose or sore throat  Respiratory: +shortness of breath  Cardiovascular: +CP only while coughing  Gastrointestinal: Negative for abdominal pain, nausea or vomiting  Genitourinary: Negative for dysuria, hematuria or flank pain  Musculoskeletal: Negative for back pain or acute joint pain/swelling  Neurological: Negative for headache, seizure, or focal weakness  Psychiatric/Behavioral: Negative for agitation, confusion or hallucinations.   Skin: Negative for rash, open wound, or pallor    Review of Systems  All other systems reviewed and negative unless otherwise noted in the HPI or above. This was done per my custom and practice for systems appropriate to the chief complaint in an emergency department setting and  varies depending on the quality of history that the patient is able to provide.      Physical Exam:   05/02/16  1159 05/02/16  1301   BP: 151/91 147/70   Pulse: 98 86   Resp: 18 24   Temp: 98.4 F (36.9 C)    SpO2: 93% 97%     Nursing note and vitals reviewed.     Physical Exam   Gen: Well appearing and nontoxic, tachypneic to 20s with abdominal muscle use on 4L NC. No respiratory distress  Head: Normocephalic, atraumatic  Eyes: PERRL, EOMI, normal conjunctiva  ENT: MMM, OP clear, normal nares  Neck: Supple, no stridor or nuchal rigidity  Pulm: Mild lower lung bibasilar crackles, no wheezing  CV: RRR, strong pulses  GI: abdomen soft, NT, ND no R/G. Flolan pump in place  Back: No CVAT, no midline TTP  Ext: 2+ pitting edema to b/l LE. 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 Low grade fevers, cough, home O2 increased to 4L but still satting at 92%. CP only while coughing. +worsening peripheral edema, has been taking extra doses of lasix at home. No orthopnea or PND. On exam, afebrile, Well appearing and nontoxic, tachypneic to 20s with abdominal muscle use on 4L NC. No respiratory distress. Mild lower lung bibasilar crackles w/no wheezing. 2_ pitting edema to b/l LE. No calf erythema or TTP.  Differential includes worsening pulmonary arterial hypertension versus pulmonary infection versus new onset left heart failure.  Low suspicion for ACS or pulmonary embolism given patient is not tachycardic, no chest pain other than when coughing.  No respiratory  distress, no need for emergent airway event, oxygen saturation stabilized on 4L NC (satting 96%).  Plan  to obtain labs, chest x-ray, stat pulmonary consultation.    Labs Reviewed   CBC WITH DIFF, BLOOD - Abnormal; Notable for the following:        Result Value    MCH 24.9 (*)     MCHC 31.2 (*)     RDW 17.2 (*)     MPV 9.0 (*)     Plt Count 115 (*)     All other components within normal limits   VENOUS BLOOD GAS - Abnormal; Notable for the following:     pO2, Ven (T) 51 (*)     pO2, Ven (Uncorr) 51 (*)     All other components within normal limits   MDIFF   HEPATITIS C AB, BLOOD   VENOUS BLOOD GAS   COMPREHENSIVE METABOLIC PANEL, BLOOD   MAGNESIUM, BLOOD   PHOSPHORUS, BLOOD   TROPONIN T GEN 5 W/REFLEX TO CK/CKMB   PROCALCITONIN, BLOOD   PRO BNP, BLOOD     Medications   epoprostenol (FLOLAN) 45,000 ng/mL in glycine diluent 100 mL infusion (not administered)   Prostacyclin Cassette Change (not administered)   Prostacyclin Ice Pack (not administered)   Prostacyclin Tubing (not administered)   Prostacylin Batteries (not administered)     X-ray Chest Frontal And Lateral    Result Date: 05/02/2016  Narrative: EXAM DESCRIPTION: X-RAY CHEST FRONTAL AND LATERAL CLINICAL HISTORY: Shortness of breath. History of Sjogren syndrome, pulmonary hypertension. COMPARISON: Chest examination 12/21/2014. FINDINGS/IMPRESSION: 1. There is stability in position of the tunneled left transjugular central venous catheter terminating proximal right atrium. 2. Grossly enlarged cardiomediastinal silhouette marked enlargement of the main and hilar pulmonary arteries. No evidence for definite interstitial pulmonary edema. 3. Moderate expanded lungs. Diffuse reticular opacities, nodular opacities are again restrained in both lungs with evidence of scarring in the lingula. 4. Blunting left lateral costophrenic sulcus with slight thickening of the lateral pleura may indicate small loculated pleural fluid collection, subpleural fibrosis. No evidence of  right sided pleural effusion. 5. Severe pulmonary hypertension. Chronic parenchymal changes likely fibrosis. New left basal pleural disease, either subpleural fibrosis or new small loculated lateral pleural effusion.     ABG without evidence of acidosis.  Other labs processing, recent does they were hemolyzed.  Chest x-ray with findings above.  Patient evaluated by pulmonary hypertension service, admission orders placed.  Patient remains hemodynamically stable no respiratory distress, satting 96% on 4 L nasal cannula.  Flolan orders written per Pulmonary.  Will continue monitor closely.      The rest of the ED course, results, and plan for the patient is in a separate continuation note. Please see that note for details.       I have discussed my evaluation and care plan for the patient with the attending physician Dr. Donneta Romberg, Larena Glassman, MD  Resident  05/02/16 [place], Pearletha Furl, MD  05/06/16 [id number]

## 2016-05-02 NOTE — ED EKG Interpretation (Signed)
ED EKG Interpretation    EKG: Normal Sinus Rhythm with Right Axis deviation and unchanged from prior tracing and of 12/21/14 with inverted Ts inferiorly, st changes.

## 2016-05-02 NOTE — ED Notes (Addendum)
Pt changed Flolan cassette with own back up at 1225. Reserve volume is  99.48ml. Verified settings and pump is running. Cassette delivers 66ml over 24hrs. Dosage nanograms/per/kg is 29

## 2016-05-03 DIAGNOSIS — Z9981 Dependence on supplemental oxygen: Secondary | ICD-10-CM

## 2016-05-03 DIAGNOSIS — R0902 Hypoxemia: Secondary | ICD-10-CM

## 2016-05-03 LAB — ECG 12-LEAD
ATRIAL RATE: 93 {beats}/min
P AXIS: 24 degrees
PR INTERVAL: 232 ms
QRS INTERVAL/DURATION: 82 ms
QT: 306 ms
QTC INTERVAL: 380 ms
R AXIS: 122 degrees
T AXIS: -45 degrees
VENTRICULAR RATE: 93 {beats}/min

## 2016-05-03 LAB — CBC WITH DIFF, BLOOD
ANC-Automated: 3.7 10*3/uL (ref 1.6–7.0)
Abs Eosinophils: 0.1 10*3/uL (ref 0.1–0.5)
Abs Lymphs: 1.3 10*3/uL (ref 0.8–3.1)
Abs Monos: 0.4 10*3/uL (ref 0.2–0.8)
Eosinophils: 1 %
Hct: 36.1 % (ref 34.0–45.0)
Hgb: 11.4 gm/dL (ref 11.2–15.7)
Imm Gran %: 1 % (ref <1)
Imm Gran Abs: 0.1 10*3/uL (ref <0.1)
Lymphocytes: 23 %
MCH: 25.2 pg — ABNORMAL LOW (ref 26.0–32.0)
MCHC: 31.6 g/dL — ABNORMAL LOW (ref 32.0–36.0)
MCV: 79.7 um3 (ref 79.0–95.0)
MPV: 9.8 fL (ref 9.4–12.4)
Monocytes: 8 %
Plt Count: 109 10*3/uL — ABNORMAL LOW (ref 140–370)
RBC: 4.53 10*6/uL (ref 3.90–5.20)
RDW: 16.9 % — ABNORMAL HIGH (ref 12.0–14.0)
Segs: 66 %
WBC: 5.5 10*3/uL (ref 4.0–10.0)

## 2016-05-03 LAB — RESPIRATORY PATHOGENS NUCLEIC ACID DETECTION TEST
(NON-COVID-19) Coronavirus 229E PCR, Respiratory: NOT DETECTED
(NON-COVID-19) Coronavirus HKU1 PCR, Respiratory: NOT DETECTED
(NON-COVID-19) Coronavirus NL63 PCR, Respiratory: NOT DETECTED
(NON-COVID-19) Coronavirus OC43 PCR, Respiratory: DETECTED — AB
Adenovirus PCR, Respiratory: NOT DETECTED
Bordetella pertussis PCR, Respiratory: NOT DETECTED
Chlamydophila pneumonia PCR, Respiratory: NOT DETECTED
Influenza A PCR, Respiratory: NOT DETECTED
Influenza B PCR, Respiratory: NOT DETECTED
Metapneumovirus PCR, Respiratory: NOT DETECTED
Mycoplasma pneumoniae PCR, Respiratory: NOT DETECTED
Parainfluenza 1 PCR, Respiratory: NOT DETECTED
Parainfluenza 2 PCR, Respiratory: NOT DETECTED
Parainfluenza 3 PCR, Respiratory: NOT DETECTED
Parainfluenza 4 PCR, Respiratory: NOT DETECTED
Respiratory Syncytial Virus PCR, Respiratory: NOT DETECTED
Rhinovirus/Enterovirus PCR, Respiratory: NOT DETECTED

## 2016-05-03 LAB — BASIC METABOLIC PANEL, BLOOD
Anion Gap: 11 mmol/L (ref 7–15)
BUN: 36 mg/dL — ABNORMAL HIGH (ref 8–23)
Bicarbonate: 25 mmol/L (ref 22–29)
Calcium: 10.2 mg/dL (ref 8.5–10.6)
Chloride: 106 mmol/L (ref 98–107)
Creatinine: 1.16 mg/dL — ABNORMAL HIGH (ref 0.51–0.95)
GFR: 47 mL/min
Glucose: 88 mg/dL (ref 70–99)
Potassium: 4.2 mmol/L (ref 3.5–5.1)
Sodium: 142 mmol/L (ref 136–145)

## 2016-05-03 LAB — C4, BLOOD: C4: 18 mg/dL (ref 10–40)

## 2016-05-03 LAB — C3, BLOOD: C3: 119 mg/dL (ref 90–180)

## 2016-05-03 LAB — MAGNESIUM, BLOOD: Magnesium: 2.2 mg/dL (ref 1.6–2.4)

## 2016-05-03 LAB — PROTHROMBIN TIME, BLOOD
INR: 3.4
PT,Patient: 36.1 s — ABNORMAL HIGH (ref 9.7–12.5)

## 2016-05-03 NOTE — Progress Notes (Signed)
Pulmonary Hypertension Progress Note    Date of Admission: 05/02/2016  Reason for Admission:  Dyspnea, A/c RH failure, A/c hypoxemic respiratory failure    Interval/Subjective:  Hospital day 2.  Patient feels that her dyspnea and chest tightness have improved.  Still having some residual pleurisy.  Some joint pain as well especially in the digits of her right hand.    Denies lightheadedness, palpitations.    Reports good UOP response to furosemide.    Objective:  Current Facility-Administered Medications   Medication    azelastine (ASTELIN) 0.1 % nasal spray 1 spray    epoprostenol (FLOLAN) 45,000 ng/mL in glycine diluent 100 mL infusion    folic acid (FOLVITE) tablet 0.5 mg    furosemide (LASIX) injection 60 mg    Prostacyclin Cassette Change    Prostacyclin Ice Pack    Prostacyclin Tubing    [START ON 05/05/2016] Prostacylin Batteries    sildenafil (REVATIO, VIAGRA) tablet 80 mg    sodium chloride (PF) 0.9 % flush 3 mL    sodium chloride (PF) 0.9 % flush 3 mL    sodium chloride 0.9 % TKO infusion    spironolactone (ALDACTONE) tablet 50 mg     VS:  Temperature:  [98.1 F (36.7 C)-98.9 F (37.2 C)] 98.1 F (36.7 C) (04/21 1200)  Blood pressure (BP): (127-150)/(57-98) 150/70 (04/21 1200)  Heart Rate:  [81-97] 94 (04/21 1200)  Respirations:  [16-22] 16 (04/21 1200)  Pain Score: 0 (04/21 1200)  O2 Device: Nasal cannula (04/21 1200)  O2 Flow Rate (L/min):  [4 l/min-4.5 l/min] 4.5 l/min (04/21 1200)  SpO2:  [92 %-98 %] 93 % (04/21 1200)    Intake/Output Summary (Last 24 hours) at 05/03/16 1356  -1L fluid balance    Exam:  Awake, up in bedside chair, husband at bedside, nad  Anicteric sclera, no conjunctival pallor  JVP mildly elevated, trachea midline  RRR, S1/S2, faint non-radiating systolic murmur LSB, no gallop  Faint rales in the L base, no ronchi or wheeze  Abd soft, mildly distended, + bowel sounds  Ext WWP, bilat pitting edema to above the knee, + clubbing  Skin flushed, CVC catheter dressing  c/d/i    Results reviewed, pertinent findings:  Lab Results   Component Value Date    WBC 5.5 05/03/2016    RBC 4.53 05/03/2016    HGB 11.4 05/03/2016    HCT 36.1 05/03/2016    MCV 79.7 05/03/2016    MCHC 31.6 05/03/2016    RDW 16.9 05/03/2016    PLT 109 05/03/2016    PLT 179 09/12/2008    MPV 9.8 05/03/2016     Lab Results   Component Value Date    NA 142 05/03/2016    K 4.2 05/03/2016    CL 106 05/03/2016    BICARB 25 05/03/2016    BUN 36 05/03/2016    CREAT 1.16 05/03/2016    GLU 88 05/03/2016    [place] 10.2 05/03/2016     Lab Results   Component Value Date    AST 17 05/02/2016    ALT 16 05/02/2016    ALK 169 05/02/2016    TP 8.4 05/02/2016    ALB 3.8 05/02/2016    TBILI 0.60 05/02/2016    DBILI 0.1 06/06/2011     Lab Results   Component Value Date    INR 3.4 05/03/2016     Lab Results   Component Value Date    COLORUA Straw 05/02/2016    APPEARUA Clear 05/02/2016  GLUCOSEUA Negative 05/02/2016    BILIUA Negative 05/02/2016    KETONEUA Negative 05/02/2016    SGUA 1.009 05/02/2016    BLOODUA 1+ (A) 05/02/2016    PHUA 6.0 05/02/2016    PROTEINUA 1+ (A) 05/02/2016    UROBILUA Negative 05/02/2016    NITRITEUA Negative 05/02/2016    LEUKESTUA Negative 05/02/2016    WBCUA 0-2 05/02/2016    RBCUA 0-2 05/02/2016    HYALINEUA >5 (A) 05/02/2016       Repeat anti-DSDNA pending  RPNA pending   Cocci screen pending  Procal 0.1    No new imaging.    [place] 02/2016:      Assessment and Plan:  47F with SLE and PAH presenting with dyspnea.  Active issues include...  1. Acute decompensated RH failure/volume overload  2. A/c hypoxemic respiratory failure  3. Pleurisy, low grade temps, and migratory polyarthralgias c/f flare of known SLE  4. 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    5. Supertherapeutic INR on chronic VKA therapy.    Patient is doing well and symptomatically has improved with diuretics, though has room to go.  No e/o active infection though some studies are still pending.  Still need to  determine how active the patient's SLE is as this may be the reason for her decompensation, syx seem c/w though the complement levels are low.  Still her DSDNA levels are impressive in the past.      Admit  Continue IV lasix BID to goal negative fluid balance.    Continue epo at home dose and sildenafil.  Have asked rheumatology to consult, they will see her today, favoring starting empiric steroids but will wait for their recs.    Follow UOP and electrolytes.  Low Na diet.  Chronic warfarin therapy, resume once the INR comes down.    Full Code.  Floor lvl care with tele.      Patient seen and discussed with Dr [name].    Ocie Doyne, M.D.  Fellow  Department of Pulmonary and Critical Care Medicine  [phone]    Pulmonary Vascular Attending Addendum       Patient seen and examined with Dr. Sunday Corn.       Though somewhat better with diuresis overnight, still with some anterior chest fullness, dyspnea and increased supplemental O2 needs.  Remains afebrile, bibasilar crackles with systolic murmur, P2 accentuation though no S3; mild calf/ankle edema; central line insert site without drainage, erythema, tenderness.       BUN 36, Creat 1.16, K+4.2, Na+142       BNPP yesterday [id number]       WBC 5.5, Hct 36.1, Platelets 109,000       BC no growth thus far    PAH with probable exacerbation given presentation; Hx "inactive SLE" though odd sx history raise some doubt about this       Plan of care as outlined, to admit and take off observation status       To push forward with diuresis, continue with sildenafil and epoprostenol       Rheumatology consult       Taper down supplemental O2 as tolerated (to her baseline)    Talmage Coin. Shanyia Stines, MD

## 2016-05-03 NOTE — Interdisciplinary (Signed)
05/03/16 1306   Patient Information   Where was the patient admitted from? home    Why is Patient in the Hospital? Volume Overload    Prior to Level of Function Ambulatory/Independent with ADL's   Primary Caretaker(s) Spouse   Permission to [place] Affiliation No   Referral To   Financial Resources Medicare   Discharge Planning   Living Arrangements Spouse / significant other   Type of Residence Private residence   Has discharge transport been arranged? Yes   Equities trader in Discharge Planning Yes   Patient Has Decision [place] Not Applicable   Social Worker Consult   Do you need to see a Education officer, museum?  No   Readmission Risk Assessment   Readmission Within 30 Days of Discharge No   Admission Was Unplanned   Recent Hospitalizations (Within Last 6 Months) No   High Risk For Readmission No   Initial Assessment Completed   CM Initial Assessment Completed     I spoke to the patient and her spouse.  Pt is alert and oriented x4.  She lives with her spouse in [place].  Has a concentrator that goes up to 6L.  Pt is on 4L currently.  She is on IV Flovan that is already setup in [place].  Spouse states they have plenty of the powder doses that they mix.  The spouse is very knowledgeable on caring for her oxygen and IV needs.  He has portable battery backup if concentrator stops working.      CM will follow when closer to discharge.

## 2016-05-03 NOTE — Consults (Signed)
RHEUMATOLOGY CONSULT NOTE  Requesting Physician: Corky Downs, MD  Reason for Consult: SLE  Date of Admission: 05/02/2016  Length of Stay: 1 Day 5 Hours    History Of Presenting Illness:   Diana Chambers is a pleasant 66 year old caucasian female with a history of SLE (+ANA/dsDNA, minimal prior symptoms, never on treatment) as well as pulmonary HTN (thought to be related to prior anorexigen use vs underlying CTD) with chronic hypoxia on home O2 and CKD currently admitted to the pulmonary service for hypoxemic respiratory failure and volume overload. Rheumatology has been consulted regarding possible contribution of SLE to her current presentation.    Diana Chambers was diagnosed with Sjogren's by an internist in [place] in 1992. She recalls having a positive SSA at that time. She never took prednisone or plaquenil and has primarily treated her sicca symptoms with topical treatments. In approximately 2001 she was diagnosed with pulmonary hypertension which has progressed to the point where she is now on sildenafil and epoprostenol. She did have an episode of "eye inflammation" She was seen by Dr. Joaquin Bend here with [facility] in 2012 at which time she was diagnosed with SLE, having positive ANA (1:160) and dsDNA titers. Prior to this visit she had an episode of eye irritation (mostly pain and redness without vision loss) which was attributed to an inflammatory rather than infectious cause and resolved with topical treatments. She has never been hypocomplementemic. SSA and SSB were negative in 2012. Dr. Joaquin Bend felt that her disease was quiescent and no treatment was indicated.     She has a history of "migratory gout" for which she was previously treated with allopurinol (but unfortunately she developed hives with this medication). She has been off this medication now for some time. She gets occasional pain in her left foot, right elbow and left shoulder but these are infrequent. She does not  currently complain of joint pain. She occasionally has "aches" in her hands but never has stiffness or swelling. She has sicca symptoms with associated dysphagia (resolved with adequate water intake). She has had a chronic rash over her face, arms and legs which began when she started flolan years ago and has not changed significantly since that time.    Over the past week she has been developing worsening peripheral edema and DOE that has been refractory to home diuretics. She also reports some lower/substernal chest pain with coughing or deep breaths (worsened by palpation of chest ). She was admitted on 05/02/16 for worsening right-heart failure.    Pertinent positive review of systems: as above, also low-grade fevers at home, mild night sweat on one occasion over past week, intermittent mild palpitations, intermittent constipation    Pertinent negative review of systems: denies headaches, fevers, chills, alopecia, oral ulcers, photosensitivity, nausea, abdominal pain, diarrhea, melena, Raynaud's, prior miscarriage/blood clot/stroke.    Except as noted above, a complete 14 point review of systems was negative.    Rheumatologic History: as above    Prior Rheum Meds:   Variable prednisone for presumed gout flares    Past Medical History:   Past Medical History:   Diagnosis Date    Pulmonary HTN     Sjoegren syndrome (CMS-HCC) 07/12/2009       Past Surgical History :   No past surgical history on file.    Family History:   Family History   Problem Relation Age of Onset    Heart Disease Mother     Hypertension Mother  Psychiatry Mother    • Heart Disease Father    Maternal aunt with questionable lupus  Maternal aunt with Sjogren's    Social History:  Social History     Social History   • Marital status: Married     Spouse name: N/A   • Number of children: N/A   • Years of education: N/A     Occupational History   • Not on file.     Social History Main Topics   • Smoking status: Former Smoker     Packs/day:  0.50     Years: 5.00     Quit date: 01/13/1978   • Smokeless tobacco: Never Used   • Alcohol use No   • Drug use: Not on file   • Sexual activity: Not on file     Other Topics Concern   • Not on file     Social History Narrative   Occasional red meat intake, denies significant EtOH or shellfish    Inpatient Medications:  Current Facility-Administered Medications   Medication Dose Route Frequency Provider Last Rate Last Dose   • azelastine (ASTELIN) 0.1 % nasal spray 1 spray  1 spray Each Naris BID Light, Matthew Perry, MD   1 spray at 05/03/16 0919   • epoprostenol (FLOLAN) 45,000 ng/mL in glycine diluent 100 mL infusion  29 ng/kg/min (Order-Specific) IntraVENOUS Continuous Papamatheakis, Demosthenes Gabriel, MD 3.33 mL/hr at 05/03/16 1422 29 ng/kg/min at 05/03/16 1422   • folic acid (FOLVITE) tablet 0.5 mg  0.5 mg Oral Daily Light, Matthew Perry, MD   0.5 mg at 05/03/16 0918   • furosemide (LASIX) injection 60 mg  60 mg IntraVENOUS BID Light, Matthew Perry, MD   60 mg at 05/03/16 1709   • Prostacyclin Cassette Change   Other Daily Papamatheakis, Demosthenes Gabriel, MD       • Prostacyclin Ice Pack   Other Q6H Papamatheakis, Demosthenes Gabriel, MD       • Prostacyclin Tubing   Other Once per day on Mon Wed Fri Papamatheakis, Demosthenes Gabriel, MD       • [START ON 05/05/2016] Prostacylin Batteries   Other Once per day on Mon Papamatheakis, Demosthenes Gabriel, MD       • sildenafil (REVATIO, VIAGRA) tablet 80 mg  80 mg Oral TID Light, Matthew Perry, MD   80 mg at 05/03/16 1426   • sodium chloride (PF) 0.9 % flush 3 mL  3 mL IntraVENOUS Q8H Light, Matthew Perry, MD   3 mL at 05/03/16 0511   • sodium chloride (PF) 0.9 % flush 3 mL  3 mL IntraVENOUS PRN Light, Matthew Perry, MD       • sodium chloride 0.9 % TKO infusion   IntraVENOUS Continuous PRN Light, Matthew Perry, MD       • spironolactone (ALDACTONE) tablet 50 mg  50 mg Oral Daily Light, Matthew Perry, MD   50 mg at 05/03/16 0918     Allergies:  Allergies      Allergen Reactions   • Allopurinol Hives   • Levaquin Swelling and Other     Severe joint pain     • Ofloxacin Nausea and Vomiting   • Chlorhexidine Itching   • Sulfa Drugs Unspecified       Physical Exam:  Temperature:  [98.1 °F (36.7 °C)-98.9 °F (37.2 °C)] 98.5 °F (36.9 °C) (04/21 1707)  Blood pressure (BP): (121-150)/(56-70) 121/56 (04/21 1707)  Heart Rate:  [81-97] 93 (04/21 1707)  Respirations:  [  16-22] 18 (04/21 1707)  Pain Score: 0 (04/21 1707)  O2 Device: Nasal cannula (04/21 1707)  O2 Flow Rate (L/min):  [4 l/min-4.5 l/min] 4.5 l/min (04/21 1707)  SpO2:  [92 %-98 %] 96 % (04/21 1707)    Gen: chronically ill-appearing WF appears stated age in NAD  HEENT: dry mucus membranes without ulcerations or trauma. No scleral icterus or injection. No alopecia.  CV: audible s1/s2 with RRR, II/VI systolic murmur loudest at LLSB without rubs/gallops. +reproducible chest pain with manual sternal pressure.  Pulm: fine inspiratory crackles heard in all fields without significant tachypnea, wheezes or distress.  Abd: soft, moderate distention with mild diffuse TTP. No r/r/g.  Extrem: 2+ edema bilateral LE to abdomen. +digital clubbing in all fingers. No cyanosis.  Skin: diffuse blanching macular erythema over face, arms, legs. Scattered peri-cuticular scabs. No sclerodactyly or telangectasias.    Neuro: alert and appropriate in conversation, fine motor coordination intact, 5/5 neck flexor strength, 5/5 deltoid strength, 5/5 bicep/tricep strength, 5/5 grip strength, 4+/5 hip flexor strength, 4+/5 quadriceps strength, 5/5 gastrocnemius strength, 5/5 tibialis anterior strength.    MSK:   No muscle tenderness, atrophy, or abnormal movements noted  Hands: no swelling or tenderness of MCPs, PIPs, or DIPs  Wrists: no swelling or tenderness, full ROM B/L  Elbows: no nodules or tophi, no swelling or tenderness, full ROM B/L without contracture  Shoulders: No SC, GH, or AC joint tenderness or swelling. FROM B/L.  Knees: mild warmth  of medial aspect of left knee with mild suprapatellar tenderness to palpation. Mild crepitus bilaterally. Minimal effusions bilaterally. FROM bilaterally.  Ankles: soft tissue edema, no TTP. Full ROM.  Feet: No tenderness or swelling at MTPs or IPs of feet, no deformity    Pertinent Labs:  CBC  Lab Results   Component Value Date    WBC 5.5 05/03/2016    WBC 4.7 05/02/2016    HGB 11.4 05/03/2016    HGB 11.9 05/02/2016    HCT 36.1 05/03/2016    HCT 38.2 05/02/2016    PLT 109 05/03/2016    PLT 115 05/02/2016    PLT 179 09/12/2008    PLT 162 09/09/2008    BAND 1 05/02/2016    BAND 3 02/22/2013    SEG 66 05/03/2016    SEG 60 05/02/2016    LYMPHS 23 05/03/2016    LYMPHS 30 05/02/2016    MONOS 8 05/03/2016    MONOS 6 05/02/2016      Chemistry  Lab Results   Component Value Date    NA 142 05/03/2016    NA 142 05/02/2016    K 4.2 05/03/2016    K 4.5 05/02/2016    CL 106 05/03/2016    CL 106 05/02/2016    BICARB 25 05/03/2016    BICARB 27 05/02/2016    BUN 36 05/03/2016    BUN 33 05/02/2016    CREAT 1.16 05/03/2016    CREAT 1.08 05/02/2016    GLU 88 05/03/2016    GLU 83 05/02/2016    CA 10.2 05/03/2016    CA 10.6 05/02/2016    MG 2.2 05/03/2016    MG 2.2 05/02/2016    PHOS 2.9 05/02/2016    PHOS 2.9 12/21/2014     Lab Results   Component Value Date    ALK 169 05/02/2016    ALK 112 12/12/2015    AST 17 05/02/2016    AST 28 12/12/2015    ALT 16 05/02/2016    ALT 21   12/12/2015    TBILI 0.60 05/02/2016    TBILI 0.64 12/12/2015    DBILI 0.1 06/06/2011    DBILI 0.1 07/12/2009    ALB 3.8 05/02/2016    ALB 4.0 12/12/2015          Coags  Lab Results   Component Value Date    PT 36.1 05/03/2016    PT 35.3 05/02/2016    PTT 36.6 12/21/2014    PTT 39.1 02/20/2013    INR 3.4 05/03/2016    INR 3.3 05/02/2016     Lab Results   Component Value Date    ESR 36 05/02/2016    ESR 60 04/16/2011    ESR 37 06/26/2010     Results for PADDY, WALTHALL Brittain Hosie (MRN [id number]) as of 05/03/2016 17:41   Ref. Range 06/26/2010 16:53 04/16/2011 15:30 08/11/2012  16:10 05/02/2016 20:36   Anti-Nuclear Ab Titer Latest Ref Range: <1:40 titer >1:640      Anti-DSDNA Latest Ref Range: 0 - 24 IU >300 (H) >200 (H) >200 (H)    C3 Latest Ref Range: 90 - 180 mg/dL 133 133 118 119   C4 Latest Ref Range: 10 - 40 mg/dL _0 SSA Ab Latest Ref Range: Negative  Negative      SSB Ab Latest Ref Range: Negative  Negative        Results for ALECHIA, LEZAMA (MRN [id number]) as of 05/03/2016 17:41   Ref. Range 05/02/2016 22:55   Protein Latest Ref Range: Negative  1+ (A)   Glucose Latest Ref Range: Negative  Negative   Ketones Latest Ref Range: Negative  Negative   Leuk Esterase Latest Ref Range: Negative  Negative   Nitrite Latest Ref Range: Negative  Negative   Bilirubin Latest Ref Range: Negative  Negative   Blood Latest Ref Range: Negative  1+ (A)   Urobilinogen Latest Ref Range: Negative  Negative   WBC Latest Ref Range: 0-2/HPF  0-2   RBC Latest Ref Range: 0-2/HPF  0-2       Pertinent Imaging:    CT chest 06/24/2010:  IMPRESSION:     Redemonstrated evidence of pulmonary hypertension. Also relatively unchanged   but extensive bilateral lung abnormalities, some of which may be seen with   pulmonary hypertension. However, follicular bronchiolitis / lymphocytic   interstitial pneumonitis is in the differential. If the pulmonary   hypertension is unexplained, then pulmonary veno-occlusive disease / capillary  hemangiomatosis may be considered.      [place] 02/2016:        Assessment:  Diana Chambers is a pleasant 66 year old caucasian female with a history of SLE (+ANA/dsDNA, minimal prior symptoms, never on treatment) as well as pulmonary HTN (thought to be related to prior anorexigen use vs underlying CTD) with chronic hypoxia on home O2 and CKD currently admitted to the pulmonary service for hypoxemic respiratory failure and volume overload. Rheumatology has been consulted regarding possible contribution of SLE to her current presentation.    She appears to have SLE based on  strongly-positive ANA and dsDNA but her history and exam do not reveal significant evidence of a flare. We do not have antiphospholipid workup on her previously and this should be done now. No evidence of lupus nephritis but if her renal function were to worsen more than expected with diuresis, we may consider repeat UA. There may be a significant CTD component to her ILD and CT may be helpful for evaluating this. Serologies to  evaluate for scleroderma, dermatomyositis and UCTD/MCTD could also be helpful to identify other potential autoimmune culprits. There is no indication for empiric steroids at this time, though if CT were to show markedly increased GGO and evidence of inflammatory ILD, we may need to consider immunomodulatory therapy at that time.    She has a history consistent with prior gout though is not currently flaring. Her ongoing diuresis places her at risk for this however and empiric colchicine could be used should this arise. XR of her left knee may be helpful considering her mildly increased warmth, tenderness and questionable effusion.    Her chest pain is reproducible on exam and likely related to costochondritis rather than pleurisy and may respond well to tylenol or celebrex.    Recommendations:  - Obtain Beta-2 microglobulins (IgA, IgG, IgM), cardiolipins (IgA, IgG, IgM), DRVVT  - Recheck SSA/SSB antibodies  - Obtain anti-Smith, RNP antibodies, SCL-70, uric acid  - Please order miscellaneous send-out lab test for: [facility] myositis panel  - Consider chest CT to evaluate for morphologic changes in her ILD  - Would obtain plain XR of left knee, weight-bearing film  - Consider tylenol vs celebrex for pain control as desired by primary team  - Consider colchicine if she were to develop gout flare during diuresis  - Consider repeat UA with microscopy if renal function were to worsen more than expected with diuresis    Thank you for allowing us to participate in the care of this patient.  Please do not  hesitate to call with any questions.  Patient seen and discussed with attending rheumatologist Dr. Lee who concurs with the above.    Jonathan Mills MD  Rheumatology Fellow    ATTENDING INITIAL CONSULT NOTE ATTESTATION    Subjective    Reason for consultation:  H/o SLE     History of present illness:  66 yo woman w/ h/o SLE (+ANA/dsdna/sicca sx), non-crystal proven gout, and ILD/PAH who was admitted w/ worsening SOB. No h/o immunosuppressives for CTD. No h/o lung bx for ILD. Been on flolan x yrs w/ help. Currently, some pleuritic ant lower chest wall pain. No other complaint besides SOB. Chronic unchanged rash on face/legs related to flolan    See fellow history and physical for further details of the patient's history.    Objective  BP 134/61 (BP Location: Left arm, BP Patient Position: Sitting)   Pulse 95   Temp 99 °F (37.2 °C)   Resp 18   Ht 5' 2" (1.575 m)   Wt 73.6 kg (162 lb 3.2 oz)   SpO2 95%   BMI 29.67 kg/m2    I have examined the patient and concur with the fellow exam.    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 H/o mild SLE w/ +ANA/dsdna/sicca sx and possibly scleritis/iritis but no other manifestations. ILD is interesting as it could be related to underlying CTD such as SLE/Sjogren's/myositis. Clinically no e/o scleroderma/CREST/ANCA vasculitis/RA which may contribute to ILD. Pt w/ sig PAH out of proportion to ILD. H/o 1 clot per pt. H/o gout. Left knee sl warm which could be more inflammatory OA vs mild resolving flare. Anterior chest pain reproducible w/ palpation suggesting costochondritis rather than true pleuritis    -repeat SSA/SSB; send rest of SLE serologies including anti-smith/RNP  -check myositis panel  adn Scl 70, anti-jo  -check APS labs (ACL Ab, DRVVT, and beta 2-glycoprotein)  -left knee x-ray  -consider repeat   chest CT to assess for progressive ILD  -given no clear e/o SLE flare, hold off on immunosuppressves and/or pred  -consider repeat  u/a if cr cont to rise; normal today  I agree with the fellow care plan.    See the fellow history and physical for further details.

## 2016-05-03 NOTE — Plan of Care (Signed)
Problem: Breathing Pattern - Ineffective  Goal: Respiratory rate, rhythm and depth return to baseline  Outcome: Progressing toward goal, anticipate improvement over: Next 24-48 hours  Patient admitted for volume overload. No acute respiratory distress noted at this time. Patient currently on 4.5 L via NC, reports that she is usually on 2L "on a good day." Flolan infusing through left broviac, cassette, tubing, and ice changed. Pt currently receiving IV lasix BID and aldactone daily, strict I&O.

## 2016-05-03 NOTE — Plan of Care (Signed)
Problem: Infection  Goal: Perform proper infection control measures  Outcome: Progressing toward goal, anticipate improvement over: >48 hours  Received report from laboratory that pt's viral swab came back positive for coronavirus.  Text page sent to Dr. Sunday Corn to inform and request order for droplet precautions.  Goggles, masks, and masks with face shields, gloves, and droplet precaution sign placed outside of patient room.  Educated patient and husband regarding results and importance of PPE for healthcare providers, pt and visitors.

## 2016-05-04 ENCOUNTER — Inpatient Hospital Stay (HOSPITAL_BASED_OUTPATIENT_CLINIC_OR_DEPARTMENT_OTHER): Payer: Medicare Other

## 2016-05-04 DIAGNOSIS — M1712 Unilateral primary osteoarthritis, left knee: Secondary | ICD-10-CM

## 2016-05-04 DIAGNOSIS — I272 Pulmonary hypertension, unspecified: Secondary | ICD-10-CM

## 2016-05-04 DIAGNOSIS — I517 Cardiomegaly: Secondary | ICD-10-CM

## 2016-05-04 LAB — URINALYSIS
Bilirubin: NEGATIVE
Glucose: NEGATIVE
Ketones: NEGATIVE
Leuk Esterase: NEGATIVE
Nitrite: NEGATIVE
Specific Gravity: 1.01 (ref 1.002–1.030)
Urobilinogen: NEGATIVE
pH: 6 (ref 5.0–8.0)

## 2016-05-04 LAB — CBC WITH DIFF, BLOOD
ANC-Automated: 3.4 10*3/uL (ref 1.6–7.0)
Abs Eosinophils: 0.1 10*3/uL (ref 0.1–0.5)
Abs Lymphs: 1.2 10*3/uL (ref 0.8–3.1)
Abs Monos: 0.3 10*3/uL (ref 0.2–0.8)
Eosinophils: 1 %
Hct: 34.3 % (ref 34.0–45.0)
Hgb: 10.9 gm/dL — ABNORMAL LOW (ref 11.2–15.7)
Imm Gran %: 1 % (ref <1)
Imm Gran Abs: 0.1 10*3/uL (ref <0.1)
Lymphocytes: 23 %
MCH: 25.2 pg — ABNORMAL LOW (ref 26.0–32.0)
MCHC: 31.8 g/dL — ABNORMAL LOW (ref 32.0–36.0)
MCV: 79.4 um3 (ref 79.0–95.0)
MPV: 9.2 fL — ABNORMAL LOW (ref 9.4–12.4)
Monocytes: 6 %
Plt Count: 102 10*3/uL — ABNORMAL LOW (ref 140–370)
RBC: 4.32 10*6/uL (ref 3.90–5.20)
RDW: 16.5 % — ABNORMAL HIGH (ref 12.0–14.0)
Segs: 67 %
WBC: 5.1 10*3/uL (ref 4.0–10.0)

## 2016-05-04 LAB — BASIC METABOLIC PANEL, BLOOD
Anion Gap: 11 mmol/L (ref 7–15)
BUN: 43 mg/dL — ABNORMAL HIGH (ref 8–23)
Bicarbonate: 25 mmol/L (ref 22–29)
Calcium: 9.8 mg/dL (ref 8.5–10.6)
Chloride: 101 mmol/L (ref 98–107)
Creatinine: 1.31 mg/dL — ABNORMAL HIGH (ref 0.51–0.95)
GFR: 41 mL/min
Glucose: 90 mg/dL (ref 70–99)
Potassium: 3.9 mmol/L (ref 3.5–5.1)
Sodium: 137 mmol/L (ref 136–145)

## 2016-05-04 LAB — MRSA SURVEILLANCE CULTURE

## 2016-05-04 LAB — CPK-CREATINE PHOSPHOKINASE, BLOOD: CPK: 28 U/L (ref 0–175)

## 2016-05-04 LAB — MAGNESIUM, BLOOD: Magnesium: 2 mg/dL (ref 1.6–2.4)

## 2016-05-04 NOTE — Plan of Care (Signed)
Problem: Breathing Pattern - Ineffective  Goal: Respiratory rate, rhythm and depth return to baseline  Outcome: Progressing toward goal, anticipate improvement over: next 12-24 hours  Pt with with dyspnea on exertion. Comfortable with O2 NC. Flolan infusing as ordered with no issues. Pt currently sleeping at this time. Instructed pt to notify for worsening sob. Will continue to monitor.     Problem: Infection  Goal: Absence of infection signs and symptoms  Outcome: Progressing toward goal, anticipate improvement over: next 12-24 hours  Pt on isolation. Tolerating well. No active signs of infection. Afebrile at this time. Instructed pt to notify for any issues. Will continue to monitor. Call light within reach. Rounding done.

## 2016-05-04 NOTE — Progress Notes (Signed)
Pulmonary Vascular Attending Addendum        Patient seen and examined with Dr. Sunday Corn.          Claims to be feeling better with less chest fullness, "lungs don't hurt"; diuresing well.        Spirits are good and starting to ask about discharge plans.          Afebrile; BP 224S systolic, HR 97N regular; O2 sat 96-99% 4.5 liters        Bibasilar crackles        S4 gallop, Normal S1, S2 split with P2 accentuation; no S3, soft systolic murmur        JVP to mid neck sitting upright        1-2 + ankle edema; patchy macular rash LEs; catheter site upper left torso w/o tenderness, drainage          Negative 800 cc yesterday (2.2 liters UO)  BUN 43, Creat 1.31, K+ 3.9, Na+137  WBC 5.1, Hct 34.3, platelets 102,000    BC negative to date  Coronavirus detected on viral swab    PAH with probable exacerbation given presentation; Hx "inactive SLE"; ? significance of coronavirus on nasal swab       Appreciate Rheumatology input and will proceed with recommendations in evaluation of her ILD/CTD       To transition back to oral diuretics prior to discharge to ensure effectiveness        Recheck INR in AM re: resumption of coumadin        No change in sildenafil, epo    Talmage Coin. Maizee Reinhold, MD

## 2016-05-04 NOTE — Plan of Care (Signed)
Problem: Breathing Pattern - Ineffective  Goal: Respiratory rate, rhythm and depth return to baseline  Outcome: Progressing toward goal, anticipate improvement over: >48 hours  O2 sat 96% on 4.5 L humidified via NC. Pt denies SOB at rest, but reports DOE.    Problem: Infection  Goal: Absence of infection signs and symptoms  Outcome: Progressing toward goal, anticipate improvement over: >48 hours  Droplet precautions. VSS.    Results for Diana, Chambers (MRN [id number]) as of 05/04/2016 14:27   Ref. Range 05/04/2016 05:30   WBC Latest Ref Range: 4.0 - 10.0 1000/mm3 5.1

## 2016-05-05 LAB — BASIC METABOLIC PANEL, BLOOD
Anion Gap: 10 mmol/L (ref 7–15)
BUN: 48 mg/dL — ABNORMAL HIGH (ref 8–23)
Bicarbonate: 29 mmol/L (ref 22–29)
Calcium: 9.9 mg/dL (ref 8.5–10.6)
Chloride: 99 mmol/L (ref 98–107)
Creatinine: 1.32 mg/dL — ABNORMAL HIGH (ref 0.51–0.95)
GFR: 40 mL/min
Glucose: 97 mg/dL (ref 70–99)
Potassium: 3.9 mmol/L (ref 3.5–5.1)
Sodium: 138 mmol/L (ref 136–145)

## 2016-05-05 LAB — DIL RUSSELL'S VIPER VENOM, BLOOD
DRWT, Control: 31.6 s
DRWT, Patient: 40.1 s
DRWT, Ratio (Pt/Con): 1.27 — ABNORMAL HIGH (ref <1.20)

## 2016-05-05 LAB — CBC WITH DIFF, BLOOD
ANC-Automated: 3.1 10*3/uL (ref 1.6–7.0)
Abs Eosinophils: 0.1 10*3/uL (ref 0.1–0.5)
Abs Lymphs: 1.1 10*3/uL (ref 0.8–3.1)
Abs Monos: 0.3 10*3/uL (ref 0.2–0.8)
Eosinophils: 2 %
Hct: 36.9 % (ref 34.0–45.0)
Hgb: 11.2 gm/dL (ref 11.2–15.7)
Imm Gran %: 1 % (ref <1)
Imm Gran Abs: 0.1 10*3/uL (ref <0.1)
Lymphocytes: 23 %
MCH: 23.9 pg — ABNORMAL LOW (ref 26.0–32.0)
MCHC: 30.4 g/dL — ABNORMAL LOW (ref 32.0–36.0)
MCV: 78.8 um3 — ABNORMAL LOW (ref 79.0–95.0)
MPV: 10.1 fL (ref 9.4–12.4)
Monocytes: 6 %
Plt Count: 113 10*3/uL — ABNORMAL LOW (ref 140–370)
RBC: 4.68 10*6/uL (ref 3.90–5.20)
RDW: 16.4 % — ABNORMAL HIGH (ref 12.0–14.0)
Segs: 68 %
WBC: 4.6 10*3/uL (ref 4.0–10.0)

## 2016-05-05 LAB — DRVVT - CONFIRM
DRWT Normalized Test Ratio: 1.02 (ref <1.12)
DRWT, Conf, Control: 27.1 s
DRWT, Conf, Patient: 33.8 s

## 2016-05-05 LAB — PROTHROMBIN TIME, BLOOD
INR: 1.6
PT,Patient: 17.2 s — ABNORMAL HIGH (ref 9.7–12.5)

## 2016-05-05 LAB — CLINICAL INTERP (CONT)

## 2016-05-05 LAB — DRVVT 1:1 MIX, BLOOD
DRWT 1:1 Mix, Patient: 34.5 s
DRWT Ratio 1:1 Mix: 1.09

## 2016-05-05 LAB — MAGNESIUM, BLOOD: Magnesium: 2.1 mg/dL (ref 1.6–2.4)

## 2016-05-05 MED ORDER — WARFARIN SODIUM 4 MG OR TABS
4.0000 mg | ORAL_TABLET | Freq: Every evening | ORAL | Status: DC
Start: 2016-05-05 — End: 2016-05-06
  Administered 2016-05-05 – 2016-05-06 (×2): 4 mg via ORAL
  Filled 2016-05-05 (×2): qty 1

## 2016-05-05 MED ORDER — ACETAMINOPHEN 10 MG/ML IV SOLN
1000.0000 mg | Freq: Once | INTRAVENOUS | Status: AC
Start: 2016-05-05 — End: 2016-05-05
  Administered 2016-05-05: 1000 mg via INTRAVENOUS
  Filled 2016-05-05: qty 100

## 2016-05-05 MED ORDER — POTASSIUM CHLORIDE CRYS CR 10 MEQ OR TBCR
20.0000 meq | EXTENDED_RELEASE_TABLET | Freq: Once | ORAL | Status: AC
Start: 2016-05-05 — End: 2016-05-05
  Administered 2016-05-05: 20 meq via ORAL
  Filled 2016-05-05: qty 2

## 2016-05-05 NOTE — Progress Notes (Addendum)
Pulmonary Vascular Service Progress Note    Date of Admission: 05/02/2016    Interval/Subjective:  Subjective improvement in dyspnea  No other complaints.    Objective:  Current Facility-Administered Medications   Medication    azelastine (ASTELIN) 0.1 % nasal spray 1 spray    epoprostenol (FLOLAN) 45,000 ng/mL in glycine diluent 100 mL infusion    folic acid (FOLVITE) tablet 0.5 mg    furosemide (LASIX) injection 60 mg    Prostacyclin Cassette Change    Prostacyclin Ice Pack    Prostacyclin Tubing    Prostacylin Batteries    sildenafil (REVATIO, VIAGRA) tablet 80 mg    sodium chloride (PF) 0.9 % flush 3 mL    sodium chloride (PF) 0.9 % flush 3 mL    sodium chloride 0.9 % TKO infusion    spironolactone (ALDACTONE) tablet 50 mg       VS:  Temperature:  [98.3 F (36.8 C)-99 F (37.2 C)] 98.7 F (37.1 C) (04/23 1126)  Blood pressure (BP): (121-140)/(55-89) 139/89 (04/23 1126)  Heart Rate:  [87-92] 88 (04/23 1126)  Respirations:  [18-20] 20 (04/23 1126)  Pain Score: 0 (04/23 1126)  O2 Device: Nasal cannula (04/23 1126)  O2 Flow Rate (L/min):  [4 l/min-4.5 l/min] 4 l/min (04/23 1126)  SpO2:  [94 %-98 %] 94 % (04/23 1126)    Intake/Output Summary (Last 24 hours) at 05/05/16 1220  -800cc    Exam:  Well appearing, NAD  RRR  Rales L base  Abd soft, + bowel sounds  LEs edematous  Skin flushed    Results reviewed, pertinent findings:  Lab Results   Component Value Date    WBC 4.6 05/05/2016    RBC 4.68 05/05/2016    HGB 11.2 05/05/2016    HCT 36.9 05/05/2016    MCV 78.8 05/05/2016    MCHC 30.4 05/05/2016    RDW 16.4 05/05/2016    PLT 113 05/05/2016    PLT 179 09/12/2008    MPV 10.1 05/05/2016     Lab Results   Component Value Date    NA 138 05/05/2016    K 3.9 05/05/2016    CL 99 05/05/2016    BICARB 29 05/05/2016    BUN 48 05/05/2016    CREAT 1.32 05/05/2016    GLU 97 05/05/2016    [place] 9.9 05/05/2016     Lab Results   Component Value Date    AST 17 05/02/2016    ALT 16 05/02/2016    ALK 169 05/02/2016    TP 8.4  05/02/2016    ALB 3.8 05/02/2016    TBILI 0.60 05/02/2016    DBILI 0.1 06/06/2011     Protein gap 4.4    Lab Results   Component Value Date    INR 1.6 05/05/2016     RPNA coronavirus positive  Cocci serology pending  C3/C4 normal  CT scan shows diffuse GG, micronodules, thin walled cysts.      Assessment and Plan:  66F with SLE and PAH presenting with dyspnea.  Active issues include...  1. Acute decompensated RH failure/volume overload  2. A/c hypoxemic respiratory failure  3. SLE  4. 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    5. 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  Though cocci is also on the differential for thin walled cysts.     Continue IV lasix BID to goal negative fluid balance.   Continue epo at home dose  and sildenafil.  Discuss CT with rheumatology   Follow UOP and electrolytes.  Low Na diet.  Resume warfarin and follow the INR.  Full Code.  Floor lvl care with tele.     Patient seen and discussed with Dr Cristal Deer.    Ocie Doyne, M.D.  Fellow  Department of Pulmonary and Critical Care Medicine  [phone]    ATTENDING PVM PROGRESS NOTE ATTESTATION    Subjective  66 y/o F with known PAH from prior anorexigen use and CTD (SLE?) now here for a few days of worse DOE, above mentioned pains, LEx edema followed by abdominal bloating and no response to home increased diuretic dose.    I have reviewed the history.  Interval history:  Diuresed over the weekend and feels better almost back to baseline. Less abdominal distention and improved DOE. Less "lung pain". Responded to IV diuretics. Labs with mild CRP elevation, rest not particularly revealing. Seen by Rheum and recommendations left. Chest CT with some cystic changes, as well as focal bronchiectasis and some scarring.No other new issues. RPNA showed positive coronavirus    ROS  No SOB at rest, +DOE which is improved, no CP, syncope, + R ankle edema. No D, N, V.  A complete ROS was negative except as  noted above.    Objective  NAD, RRR +M, CTAB, NTND, + R ankle edema.   I have examined the patient and concur with the fellow exam.    Assessment and Plan  66 y/o F with known PAH from prior anorexigen use and CTD (SLE?) now here for a few days of worse DOE, above mentioned pains, LEx edema followed by abdominal bloating and no response to home increased diuretic dose.  1. PAH with volume overload:  - Improving with IV Lasix  - Continue IV Epo and PO revatio  - Follow Cr and UO  - Recent RHC with preserved CO  - Markedly elevated proBNP  - Convert to PO diuretics in AM  - Plan for d/c tomorrow if continued to improve  2. DOE:  - Likely in the setting of #1  - Infection unlikely, although possible coronavirus may have precipitates this (pt without classic Sx but had mild temp and some aches)  - Cocci (lives in [place]) titer pending  3. 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 CTD:  - Positive dsDNA, suspect SLE  - Mild CRP elevation  - Seen by Rheum  - Chest CT as noted above  - Getting PFTs- No need for steroids at this time    I agree with the fellow care plan.  See the fellow note for further details.    Francesca Jewett, MD  Pulmonary and Critical Care  PID [id number] / Pager [phone]

## 2016-05-05 NOTE — Plan of Care (Signed)
Problem: Breathing Pattern - Ineffective  Goal: Respiratory rate, rhythm and depth return to baseline  Outcome: Progressing toward goal, anticipate improvement over: next 12-24 hours  Pt currently comfortable with 4.5L O2 via NC. Able to perform ADLs with minimal dyspnea. Husband at bedside. Flolan infusing with no issues. Will continue to monitor. Diuresing well. Strict I/Os. Call light within reach.     Problem: Infection  Goal: Absence of infection signs and symptoms  Outcome: Progressing toward goal, anticipate improvement over: next 12-24 hours  Pt on isolation at this time. No active signs of infection. Afebrile. Instructed pt to notify for any signs of infection. Husband at bedside. Will continue to monitor.

## 2016-05-05 NOTE — Plan of Care (Signed)
Problem: Breathing Pattern - Ineffective  Goal: Arterial blood gas values and/or saturation return to baseline  Outcome: Progressing toward goal, anticipate improvement over: >48 hours  Breathing even and unlabored, denies SOB, O2 sat 98% on 4lpm    Problem: Infection  Goal: Absence of infection signs and symptoms  Outcome: Progressing toward goal, anticipate improvement over: >48 hours  Results for Diana, Chambers (MRN [id number]) as of 05/05/2016 10:43   Ref. Range 05/05/2016 05:47   WBC Latest Ref Range: 4.0 - 10.0 1000/mm3 4.6

## 2016-05-05 NOTE — Plan of Care (Signed)
Problem: Tissue Perfusion, Cardiopulmonary - Altered  Goal: Hemodynamic stability  Outcome: Progressing toward goal, anticipate improvement over: >48 hours  VSS, tele=NSR Continue Flolan 42ml/24hrs.

## 2016-05-06 DIAGNOSIS — I50813 Acute on chronic right heart failure: Secondary | ICD-10-CM

## 2016-05-06 DIAGNOSIS — J849 Interstitial pulmonary disease, unspecified: Secondary | ICD-10-CM

## 2016-05-06 DIAGNOSIS — I270 Primary pulmonary hypertension: Secondary | ICD-10-CM

## 2016-05-06 DIAGNOSIS — M329 Systemic lupus erythematosus, unspecified: Secondary | ICD-10-CM

## 2016-05-06 LAB — CBC WITH DIFF, BLOOD
ANC-Automated: 3.1 10*3/uL (ref 1.6–7.0)
Abs Eosinophils: 0.1 10*3/uL (ref 0.1–0.5)
Abs Lymphs: 1 10*3/uL (ref 0.8–3.1)
Abs Monos: 0.3 10*3/uL (ref 0.2–0.8)
Eosinophils: 2 %
Hct: 35 % (ref 34.0–45.0)
Hgb: 10.8 gm/dL — ABNORMAL LOW (ref 11.2–15.7)
Imm Gran %: 1 % (ref <1)
Imm Gran Abs: 0.1 10*3/uL (ref <0.1)
Lymphocytes: 23 %
MCH: 24.3 pg — ABNORMAL LOW (ref 26.0–32.0)
MCHC: 30.9 g/dL — ABNORMAL LOW (ref 32.0–36.0)
MCV: 78.7 um3 — ABNORMAL LOW (ref 79.0–95.0)
MPV: 10.2 fL (ref 9.4–12.4)
Monocytes: 6 %
Plt Count: 108 10*3/uL — ABNORMAL LOW (ref 140–370)
RBC: 4.45 10*6/uL (ref 3.90–5.20)
RDW: 16.4 % — ABNORMAL HIGH (ref 12.0–14.0)
Segs: 68 %
WBC: 4.6 10*3/uL (ref 4.0–10.0)

## 2016-05-06 LAB — URIC ACID, BLOOD: Uric Acid: 11.8 mg/dL — ABNORMAL HIGH (ref 2.4–5.7)

## 2016-05-06 LAB — ALDOLASE, BLOOD: Aldolase: 5.7 U/L (ref 1.5–8.1)

## 2016-05-06 LAB — BASIC METABOLIC PANEL, BLOOD
Anion Gap: 8 mmol/L (ref 7–15)
BUN: 53 mg/dL — ABNORMAL HIGH (ref 8–23)
Bicarbonate: 27 mmol/L (ref 22–29)
Calcium: 9.7 mg/dL (ref 8.5–10.6)
Chloride: 100 mmol/L (ref 98–107)
Creatinine: 1.31 mg/dL — ABNORMAL HIGH (ref 0.51–0.95)
GFR: 41 mL/min
Glucose: 102 mg/dL — ABNORMAL HIGH (ref 70–99)
Potassium: 3.7 mmol/L (ref 3.5–5.1)
Sodium: 135 mmol/L — ABNORMAL LOW (ref 136–145)

## 2016-05-06 LAB — PROTHROMBIN TIME, BLOOD
INR: 1.6
PT,Patient: 16.7 s — ABNORMAL HIGH (ref 9.7–12.5)

## 2016-05-06 LAB — CARDIOLIPIN ANTIBODY, IGA: Cardiolipin IgA, Ab: 0 [APL'U] (ref 0–11)

## 2016-05-06 LAB — ANTI-DSDNA, BLOOD: Anti-DSDNA: >200 IU — ABNORMAL HIGH (ref 0–24)

## 2016-05-06 LAB — COCCIDIOIDES SCREEN, SERUM
Coccidiodes Antibody IgG Serum: 0.253 EIA Units
Coccidiodes Antibody IgM, Serum: 0.089 EIA Units

## 2016-05-06 LAB — SSA, BLOOD: SSA Ab: >240 U/mL — ABNORMAL HIGH (ref 0.0–6.9)

## 2016-05-06 LAB — SSB, BLOOD: SSB Ab: 58 U/mL — ABNORMAL HIGH (ref 0.0–6.9)

## 2016-05-06 LAB — PATHOLOGIST REVIEW

## 2016-05-06 LAB — CARDIOLIPIN ANTIBODY, IGM: Cardiolipin IgM, Ab: 0 [MPL'U] (ref 0–12)

## 2016-05-06 LAB — RNP (RIBONUCLEICPROTEIN AB), BLOOD: RNP (Ribonucloprotein Ab): 2.4 U/mL (ref 0.0–4.9)

## 2016-05-06 LAB — MAGNESIUM, BLOOD: Magnesium: 2.1 mg/dL (ref 1.6–2.4)

## 2016-05-06 LAB — SMITH ANTIBODY, BLOOD: Smith Ab: 3.6 U/mL (ref 0.0–6.9)

## 2016-05-06 MED ORDER — COLCHICINE 0.6 MG OR TABS
0.6000 mg | ORAL_TABLET | Freq: Every day | ORAL | Status: DC
Start: 2016-05-06 — End: 2016-05-16
  Administered 2016-05-06 – 2016-05-16 (×11): 0.6 mg via ORAL
  Filled 2016-05-06 (×11): qty 1

## 2016-05-06 MED ORDER — FERROUS SULFATE 324 (65 FE) MG PO TBEC
324.0000 mg | DELAYED_RELEASE_TABLET | Freq: Two times a day (BID) | ORAL | Status: DC
Start: 2016-05-06 — End: 2016-05-10
  Administered 2016-05-06 – 2016-05-10 (×8): 324 mg via ORAL
  Filled 2016-05-06 (×8): qty 1

## 2016-05-06 MED ORDER — FUROSEMIDE 40 MG OR TABS
80.0000 mg | ORAL_TABLET | Freq: Two times a day (BID) | ORAL | Status: DC
Start: 2016-05-06 — End: 2016-05-06

## 2016-05-06 MED ORDER — FUROSEMIDE 10 MG/ML IJ SOLN
60.0000 mg | Freq: Three times a day (TID) | INTRAMUSCULAR | Status: DC
Start: 2016-05-06 — End: 2016-05-11
  Administered 2016-05-06 – 2016-05-11 (×14): 60 mg via INTRAVENOUS
  Filled 2016-05-06 (×15): qty 6

## 2016-05-06 NOTE — Plan of Care (Signed)
Problem: Breathing Pattern - Ineffective  Goal: Respiratory rate, rhythm and depth return to baseline  Outcome: Progressing toward goal, anticipate improvement over: >48 hours  SOB at all times 4 liters nc  On droplet precautions     Problem: Tissue Perfusion, Cardiopulmonary - Altered  Goal: Hemodynamic stability    Intervention: Ensure that hemodynamic monitoring, vital signs, accurately reflects patient's status  VSS afebrile concern for LE edema R>L  Pedal edema  With some associated pain in foot

## 2016-05-06 NOTE — Progress Notes (Signed)
Pulmonary Vascular Progress Note    Date of Admission: 05/02/2016    Interval/Subjective:  Breathing continues to improve  C/o R foot pain last night and this am    Objective:  Current Facility-Administered Medications   Medication    azelastine (ASTELIN) 0.1 % nasal spray 1 spray    epoprostenol (FLOLAN) 45,000 ng/mL in glycine diluent 100 mL infusion    folic acid (FOLVITE) tablet 0.5 mg    furosemide (LASIX) injection 60 mg    Prostacyclin Cassette Change    Prostacyclin Ice Pack    Prostacyclin Tubing    Prostacylin Batteries    sildenafil (REVATIO, VIAGRA) tablet 80 mg    sodium chloride (PF) 0.9 % flush 3 mL    sodium chloride (PF) 0.9 % flush 3 mL    sodium chloride 0.9 % TKO infusion    spironolactone (ALDACTONE) tablet 50 mg    warfarin (COUMADIN) tablet 4 mg       VS:  Temperature:  [98.1 F (36.7 C)-98.9 F (37.2 C)] 98.1 F (36.7 C) (04/24 1134)  Blood pressure (BP): (127-150)/(59-72) 150/70 (04/24 1134)  Heart Rate:  [85-92] 87 (04/24 1134)  Respirations:  [16-20] 18 (04/24 1134)  Pain Score: 0 (04/24 1134)  O2 Device: Nasal cannula (04/24 1134)  O2 Flow Rate (L/min):  [4 l/min] 4 l/min (04/24 1134)  SpO2:  [92 %-96 %] 95 % (04/24 1134)    Intake/Output Summary (Last 24 hours) at 05/06/16 1247  -2L fluid balance/24 hours    Exam:  Well appearing, NAD  RRR  Rales L base  Abd soft, + bowel sounds  LEs edematous  Skin flushed    Results reviewed, pertinent findings:  Lab Results   Component Value Date    WBC 4.6 05/06/2016    RBC 4.45 05/06/2016    HGB 10.8 (L) 05/06/2016    HCT 35.0 05/06/2016    MCV 78.7 (L) 05/06/2016    MCHC 30.9 (L) 05/06/2016    RDW 16.4 (H) 05/06/2016    PLT 108 (L) 05/06/2016    PLT 179 09/12/2008    MPV 10.2 05/06/2016     Lab Results   Component Value Date    NA 135 (L) 05/06/2016    K 3.7 05/06/2016    CL 100 05/06/2016    BICARB 27 05/06/2016    BUN 53 (H) 05/06/2016    CREAT 1.31 (H) 05/06/2016    GLU 102 (H) 05/06/2016     9.7 05/06/2016     Lab Results      Component Value Date    AST 17 05/02/2016    ALT 16 05/02/2016    ALK 169 (H) 05/02/2016    TP 8.4 (H) 05/02/2016    ALB 3.8 05/02/2016    TBILI 0.60 05/02/2016    DBILI 0.1 06/06/2011     Lab Results   Component Value Date    INR 1.6 05/06/2016     RPNA coronavirus positive  Cocci serology pending  C3/C4 normal  CT scan shows diffuse GG, micronodules, thin walled cysts.    PFTs show mixed disease, obstruction appears worse than prior, otherwise no change since 2012    Assessment and Plan:  75F with SLE and PAH presenting with dyspnea. Active issues include...  1. Acute decompensated RH failure/volume overload  2. A/c hypoxemic respiratory failure  3. SLE  4. 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   5. On chronic VKA therapy    Diuresing well with  improvement in her syx.  From that standpoint should be able to switch to an oral diuretic regimen in prep for discharge tomorrow.  Still interested to see what the cocci serology shows.  Reviewed CT scan with radiology, cysts are larger in size but there are no new cysts, progression would be unusual for LIP.  Remains on warfarin.      Patient seen and discussed with Dr Cristal Deer.    Ocie Doyne, M.D.  Fellow  Department of Pulmonary and Critical Care Medicine  [phone]      Pulmonary Vascular Attending Addendum:  Patient was examined with Dr. Sunday Corn  Labs, imaging and tests reviewed.  Please see above note for full details and plan.    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  Trigger for decompensation unclear - may be related to concurrent viral infection.   Symptoms have improved with IV diuretics but she remains more SOB than usual with persistent 1-2 + pitted LE edema.      -Continue with IV diuretics until euvolemic  -Once euvolemic will transition to oral diuretic regimen  -Continue IV epo @ 29 ng and sildenafil 80 mg  TID  -Appreciate rheum input - no role for immunosuppression at this time.  -Will start colchicine for gout  -Microcytic anemia - will check ferritin and iron studies.  Start supplemental iron pending results.  -CT scan and PFT's with minimal change/deterioration over 5+ years.  -Hold warfarin therapy.  Will discuss need for Vit K Antagonist therapy with patient and husband prior to hospital discharge.  There is limited evidence to support the use of Vit K A therapy in [place] relate to CTD so this may be stopped unless there is a secondary indication.      [name] MD  Pager [phone]

## 2016-05-06 NOTE — Procedures (Signed)
No note

## 2016-05-06 NOTE — Plan of Care (Cosign Needed)
Problem: Breathing Pattern - Ineffective  Goal: Respiratory rate, rhythm and depth return to baseline  Outcome: Progressing toward goal, anticipate improvement over: Next 24-48 hours  Pt respirations even and unlabored. On NC 4 liters with saturations within 94-95% at this time. Pt experiences dyspnea on exertion. Will continue to monitor.     Problem: Tissue Perfusion, Cardiopulmonary - Altered  Goal: Hemodynamic stability  Outcome: Progressing toward goal, anticipate improvement over: >48 hours  VSS stable, NSR, systolic blood pressure readings between 130-150 at this time with palpable pulses. Pt denies SOB. Continue Flolan 80 mL/24 hours. Pt has lower extremity bilateral edema and pain in feet. On IV diuretics. Will continue to monitor.

## 2016-05-06 NOTE — Progress Notes (Addendum)
RHEUMATOLOGY INPATIENT PROGRESS NOTE  Requesting Physician: Corky Downs, MD  Reason for Consult: SLE  Date of Admission: 05/02/2016  Length of Stay: 1 Day 5 Hours    History Of Presenting Illness:   Diana Chambers is a pleasant 66 year old caucasian female with a history of SLE (+ANA/dsDNA, minimal prior symptoms, never on treatment) as well as pulmonary HTN (thought to be related to prior anorexigen use vs underlying CTD) with chronic hypoxia on home O2 and CKD currently admitted to the pulmonary service for hypoxemic respiratory failure and volume overload. Rheumatology has been consulted regarding possible contribution of SLE to her current presentation. Please see consultation note from 05/03/16 for details of initial presentation.    Interval hx:   Significant improvement in her breathing since the weekend. She responded well to diuretic therapy. Complains of some mild pain in the right midfoot region and is concerned it may be "traveling gout".    Inpatient Medications:  Current Facility-Administered Medications   Medication Dose Route Frequency Provider Last Rate Last Dose    azelastine (ASTELIN) 0.1 % nasal spray 1 spray  1 spray Each Naris BID Marcy Siren, MD   1 spray at 05/06/16 0825    epoprostenol (FLOLAN) 45,000 ng/mL in glycine diluent 100 mL infusion  29 ng/kg/min (Order-Specific) IntraVENOUS Continuous Papamatheakis, Demosthenes Gabriel, MD 3.33 mL/hr at 05/05/16 1400 29 ng/kg/min at 10/62/69 4854    folic acid (FOLVITE) tablet 0.5 mg  0.5 mg Oral Daily Marcy Siren, MD   0.5 mg at 05/06/16 6270    furosemide (LASIX) injection 60 mg  60 mg IntraVENOUS BID Marcy Siren, MD   60 mg at 05/06/16 3500    Prostacyclin Cassette Change   Other Daily Papamatheakis, Pearson Forster, MD        Prostacyclin Ice Pack   Other Q6H Papamatheakis, Pearson Forster, MD        Prostacyclin Tubing   Other Once per day on Mon Wed Fri Papamatheakis, Pearson Forster,  MD        Prostacylin Batteries   Other Once per day on Mon Papamatheakis, Hoagland, MD        sildenafil (REVATIO, VIAGRA) tablet 80 mg  80 mg Oral TID Marcy Siren, MD   80 mg at 05/06/16 1249    sodium chloride (PF) 0.9 % flush 3 mL  3 mL IntraVENOUS Q8H Marcy Siren, MD   3 mL at 05/06/16 9381    sodium chloride (PF) 0.9 % flush 3 mL  3 mL IntraVENOUS PRN Marcy Siren, MD        sodium chloride 0.9 % TKO infusion   IntraVENOUS Continuous PRN Marcy Siren, MD        spironolactone (ALDACTONE) tablet 50 mg  50 mg Oral Daily Marcy Siren, MD   50 mg at 05/06/16 8299    warfarin (COUMADIN) tablet 4 mg  4 mg Oral QPM Marcy Siren, MD   4 mg at 05/05/16 1809     Allergies:  Allergies   Allergen Reactions    Allopurinol Hives    Levaquin Swelling and Other     Severe joint pain      Ofloxacin Nausea and Vomiting    Chlorhexidine Itching    Sulfa Drugs Unspecified       Physical Exam:  Temperature:  [98.1 F (36.7 C)-98.9 F (37.2 C)] 98.1 F (36.7 C) (04/24 1134)  Blood pressure (BP): (127-150)/(59-72) 150/70 (04/24 1134)  Heart Rate:  [85-92] 87 (04/24 1134)  Respirations:  [16-20] 18 (04/24 1134)  Pain Score: 0 (04/24 1134)  O2 Device: Nasal cannula (04/24 1134)  O2 Flow Rate (L/min):  [4 l/min] 4 l/min (04/24 1134)  SpO2:  [92 %-96 %] 95 % (04/24 1134)    Gen: chronically ill-appearing WF appears stated age in NAD  HEENT: dry mucus membranes without ulcerations or trauma. No scleral icterus or injection. No alopecia.  CV: audible s1/s2 with RRR, II/VI systolic murmur loudest at LLSB without rubs/gallops.  Pulm: fine inspiratory crackles heard in all fields without significant tachypnea, wheezes or distress.  Extrem: 2+ edema bilateral LE to abdomen. +digital clubbing in all fingers. No cyanosis.  Skin: diffuse blanching macular erythema over face, arms, legs, moderately worsened since 05/03/16. Scattered peri-cuticular scabs. No sclerodactyly  or telangectasias.    Neuro: alert and appropriate in conversation, MAEW, no focal deficits    MSK:   No muscle tenderness, atrophy, or abnormal movements noted  Hands: no swelling or tenderness of MCPs, PIPs, or DIPs  Right foot: diffuse soft tissue edema with mild TTP over dorsal midfoot. No warmth, erythema or abnormal masses appreciated.    Pertinent Labs:  CBC  Lab Results   Component Value Date    WBC 4.6 05/06/2016    WBC 4.6 05/05/2016    HGB 10.8 (L) 05/06/2016    HGB 11.2 05/05/2016    HCT 35.0 05/06/2016    HCT 36.9 05/05/2016    PLT 108 (L) 05/06/2016    PLT 113 (L) 05/05/2016    PLT 179 09/12/2008    PLT 162 09/09/2008    BAND 1 05/02/2016    BAND 3 02/22/2013    SEG 68 05/06/2016    SEG 68 05/05/2016    LYMPHS 23 05/06/2016    LYMPHS 23 05/05/2016    MONOS 6 05/06/2016    MONOS 6 05/05/2016      Chemistry  Lab Results   Component Value Date    NA 135 (L) 05/06/2016    NA 138 05/05/2016    K 3.7 05/06/2016    K 3.9 05/05/2016    CL 100 05/06/2016    CL 99 05/05/2016    BICARB 27 05/06/2016    BICARB 29 05/05/2016    BUN 53 (H) 05/06/2016    BUN 48 (H) 05/05/2016    CREAT 1.31 (H) 05/06/2016    CREAT 1.32 (H) 05/05/2016    GLU 102 (H) 05/06/2016    GLU 97 05/05/2016    [place] 9.7 05/06/2016    [place] 9.9 05/05/2016    MG 2.1 05/06/2016    MG 2.1 05/05/2016    PHOS 2.9 05/02/2016    PHOS 2.9 12/21/2014     Lab Results   Component Value Date    ALK 169 (H) 05/02/2016    ALK 112 12/12/2015    AST 17 05/02/2016    AST 28 12/12/2015    ALT 16 05/02/2016    ALT 21 12/12/2015    TBILI 0.60 05/02/2016    TBILI 0.64 12/12/2015    DBILI 0.1 06/06/2011    DBILI 0.1 07/12/2009    ALB 3.8 05/02/2016    ALB 4.0 12/12/2015          Coags  Lab Results   Component Value Date    PT 16.7 (H) 05/06/2016    PT 17.2 (H) 05/05/2016    PTT 36.6 (H) 12/21/2014    PTT 39.1 (H) 02/20/2013  INR 1.6 05/06/2016    INR 1.6 05/05/2016     Lab Results   Component Value Date    ESR 36 (H) 05/02/2016    ESR 60 (H) 04/16/2011    ESR 37 (H)  06/26/2010     Results for GURPREET, MARIANI (MRN [id number]) as of 05/03/2016 17:41   Ref. Range 06/26/2010 16:53 04/16/2011 15:30 08/11/2012 16:10 05/02/2016 20:36   Anti-Nuclear Ab Titer Latest Ref Range: <1:40 titer >1:640      Anti-DSDNA Latest Ref Range: 0 - 24 IU >300 (H) >200 (H) >200 (H)    C3 Latest Ref Range: 90 - 180 mg/dL 133 133 118 119   C4 Latest Ref Range: 10 - 40 mg/dL _0 SSA Ab Latest Ref Range: Negative  Negative      SSB Ab Latest Ref Range: Negative  Negative        Results for AMIREE, NO (MRN [id number]) as of 05/06/2016 13:24   Ref. Range 05/04/2016 14:45   RNP (Ribonucloprotein Ab) Latest Ref Range: 0.0 - 4.9 U/mL 2.4   Smith Ab Latest Ref Range: 0.0 - 6.9 U/mL 3.6   SSA Ab Latest Ref Range: 0.0 - 6.9 U/mL >240.0 (H)   SSB Ab Latest Ref Range: 0.0 - 6.9 U/mL 58.0 (H)       Results for TAYEN, NARANG (MRN [id number]) as of 05/03/2016 17:41   Ref. Range 05/02/2016 22:55   Protein Latest Ref Range: Negative  1+ (A)   Glucose Latest Ref Range: Negative  Negative   Ketones Latest Ref Range: Negative  Negative   Leuk Esterase Latest Ref Range: Negative  Negative   Nitrite Latest Ref Range: Negative  Negative   Bilirubin Latest Ref Range: Negative  Negative   Blood Latest Ref Range: Negative  1+ (A)   Urobilinogen Latest Ref Range: Negative  Negative   WBC Latest Ref Range: 0-2/HPF  0-2   RBC Latest Ref Range: 0-2/HPF  0-2       Pertinent Imaging:    CT chest 06/24/2010:  IMPRESSION:     Redemonstrated evidence of pulmonary hypertension. Also relatively unchanged   but extensive bilateral lung abnormalities, some of which may be seen with   pulmonary hypertension. However, follicular bronchiolitis / lymphocytic   interstitial pneumonitis is in the differential. If the pulmonary   hypertension is unexplained, then pulmonary veno-occlusive disease / capillary  hemangiomatosis may be considered.    CT chest wo contrast 05/04/16:  IMPRESSION:  1. Severe enlargement of the pulmonary  artery with increased calcification along the pulmonary arterial walls compatible with longstanding pulmonary hypertension. Progressed parenchymal changes of the lungs compatible with pulmonary hypertension .    2. Increased thin wall l cystic esions seen within the lungs, nonspecific be can be seen with underlying lymphocytic interstitial pneumonia, which can be seen in patients with lupus/Sjogren's disease.    3. Cardiomegaly with evidence of right heart dysfunction. Trace nonspecific pericardial fluid with pericardial thickening suspected. In a patient with known lupus underlying pericarditis/serositis may be present. Correlation with echocardiogram is recommended.    4. Likely reactive adenopathy of the thorax.    5. Additional findings as detailed above.      [place] 02/2016:        Assessment:  Nguyen Butler is a pleasant 66 year old caucasian female with a history of SLE (+ANA/dsDNA, minimal prior symptoms, never on treatment) as well as pulmonary HTN (thought to be related to prior  anorexigen use vs underlying CTD) with chronic hypoxia on home O2 and CKD currently admitted to the pulmonary service for hypoxemic respiratory failure and volume overload. Rheumatology has been consulted regarding possible contribution of SLE to her current presentation.    She has a history of SLE based on strongly-positive ANA and dsDNA but her history and exam do not reveal significant evidence of disease activity. Anti-phospholipid studies pending. No evidence of lupus nephritis based on her UA. 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 Serologies to evaluate for scleroderma, dermatomyositis and UCTD/MCTD are pending. PFTs appear stable from prior studies. There is no indication for empiric steroids at this time, though if she were to worsen significantly then it may  be indicated. Choice of definitive therapy (MMF vs AZA) can be safely deferred to the outpatient setting.    She has a history consistent with prior gout and may be starting to have a mild flare in her right foot (likely precipitated by diuresis). Colchicine may be helpful for prevention.    Recommendations:  - Will await finalization of Beta-2 microglobulins (IgA, IgG, IgM), cardiolipins (IgA, IgG, IgM), DRVVT, SCL-70, uric acid, [facility]UP myositis panel  - Would add colchicine 0.6 mg daily for gout flare prophylaxis  - We have made arrangements for her to follow-up with our [place] [facility] clinic within 4-8 weeks of discharge.    Thank you for allowing [place] to participate in the care of this patient.  Please do not hesitate to call with any questions.  Patient seen and discussed with attending rheumatologist Dr. Truman Hayward who concurs with the above.    Keturah Shavers MD  Rheumatology Fellow    ATTENDING PROGRESS NOTE ATTESTATION    Subjective    I have reviewed the history.  Interval history:  SOB back to near baseline. Cont to have swelling of LE bilat w/ some dull ache; no erythema    Objective  BP 150/70 (BP Location: Left arm, BP Patient Position: Semi-Fowlers)   Pulse 87   Temp 98.1 F (36.7 C)   Resp 18   Ht '5\' 2"'$  (1.575 m)   Wt 73.6 kg (162 lb 3.2 oz)   SpO2 95%   BMI 29.67 kg/m2  2+ pitting edema bilat LE but no synovitis of ankles/midfoot    Assessment and Plan  A) PAH/ILD: pt w/ long standing PAH and mild ILD of unclear etiology. Repeat serologies strongly positive for dsdna/SSA/SSB. 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 Either primary sjogren's with +SLE with secondary sjogrens with +ANA/dsdna/SSA/SSB/scleritis/iritis. Clinically no e/o scleroderma/CREST/ANCA vasculitis/RA which may contribute to ILD. Pt w/ sig PAH out of proportion to ILD. Repeat Chest CT w/ more conspicuous cystic changes but overall, about the same. H/o 1 clot per pt. H/o gout and serum uric acid quite elevated at  11. Most likely having minor gout flares intermittently, esp in setting of aggressive diuresis for her PAH. No e/o acute flare currently despite some joint pain.     -pending myositis panel  adn Scl 70, anti-jo  -pending rest of APS labs (ACL Ab and beta 2-glycoprotein) to r/o secondary APS related to SLE  -can consider doing a trial of steroid if her SOB worsens or chest CT findings worsen significantly  -would start colchicine 0.'6mg'$  po qdaily if not contraindicated with flolan  -would benefit  from uric acid lowering therapy given sig hyperuricemia, frequent flares, and sig diuretic use (needed for her [place]); will do this as outpt  -please send of HLA [id number] prior to d/c in prep for allopurinol use  -pt agreeable to f/up with [facility] (either Dr. Candiss Norse or myself who have Wed afternoon clinic); pt will call upon d/c to schedule appt ([phone])  I agree with the fellow care plan.

## 2016-05-07 ENCOUNTER — Inpatient Hospital Stay (HOSPITAL_COMMUNITY): Payer: Medicare Other

## 2016-05-07 DIAGNOSIS — I871 Compression of vein: Secondary | ICD-10-CM

## 2016-05-07 LAB — BLOOD CULTURE: Blood Culture Result: NO GROWTH

## 2016-05-07 LAB — IBC - IRON BINDING CAPACITY
Iron Saturation: 18 %
Iron: 38 ug/dL (ref 37–145)
Total IBC: 212 ug/dL (ref 148–506)
UIBC: 174 ug/dL (ref 112–346)

## 2016-05-07 LAB — CBC WITH DIFF, BLOOD
ANC-Automated: 2.5 10*3/uL (ref 1.6–7.0)
Abs Eosinophils: 0.1 10*3/uL (ref 0.1–0.5)
Abs Lymphs: 1.2 10*3/uL (ref 0.8–3.1)
Abs Monos: 0.3 10*3/uL (ref 0.2–0.8)
Eosinophils: 2 %
Hct: 35.7 % (ref 34.0–45.0)
Hgb: 10.9 gm/dL — ABNORMAL LOW (ref 11.2–15.7)
Imm Gran %: 2 % — ABNORMAL HIGH (ref <1)
Imm Gran Abs: 0.1 10*3/uL (ref <0.1)
Lymphocytes: 28 %
MCH: 24.1 pg — ABNORMAL LOW (ref 26.0–32.0)
MCHC: 30.5 g/dL — ABNORMAL LOW (ref 32.0–36.0)
MCV: 79 um3 (ref 79.0–95.0)
MPV: 9.6 fL (ref 9.4–12.4)
Monocytes: 7 %
Plt Count: 111 10*3/uL — ABNORMAL LOW (ref 140–370)
RBC: 4.52 10*6/uL (ref 3.90–5.20)
RDW: 16.4 % — ABNORMAL HIGH (ref 12.0–14.0)
Segs: 61 %
WBC: 4.1 10*3/uL (ref 4.0–10.0)

## 2016-05-07 LAB — BASIC METABOLIC PANEL, BLOOD
Anion Gap: 12 mmol/L (ref 7–15)
BUN: 55 mg/dL — ABNORMAL HIGH (ref 8–23)
Bicarbonate: 27 mmol/L (ref 22–29)
Calcium: 9.6 mg/dL (ref 8.5–10.6)
Chloride: 98 mmol/L (ref 98–107)
Creatinine: 1.17 mg/dL — ABNORMAL HIGH (ref 0.51–0.95)
GFR: 46 mL/min
Glucose: 126 mg/dL — ABNORMAL HIGH (ref 70–99)
Potassium: 3.5 mmol/L (ref 3.5–5.1)
Sodium: 137 mmol/L (ref 136–145)

## 2016-05-07 LAB — FERRITIN, BLOOD: Ferritin: 73 ng/mL (ref 13–150)

## 2016-05-07 LAB — PROTHROMBIN TIME, BLOOD
INR: 1.6
PT,Patient: 17.5 s — ABNORMAL HIGH (ref 9.7–12.5)

## 2016-05-07 LAB — MAGNESIUM, BLOOD: Magnesium: 2.2 mg/dL (ref 1.6–2.4)

## 2016-05-07 LAB — BETA-2 GLYCOPROTEIN 1 AB, IGG & IGM PANEL, BLOOD
Beta-2 Glycoprotein 1 Ab, IGG: 21 SGU — ABNORMAL HIGH (ref 0–20)
Beta-2 Glycoprotein 1 Ab, IGM: 5 SMU (ref 0–20)

## 2016-05-07 MED ORDER — POTASSIUM CHLORIDE CRYS CR 10 MEQ OR TBCR
40.0000 meq | EXTENDED_RELEASE_TABLET | Freq: Once | ORAL | Status: AC
Start: 2016-05-07 — End: 2016-05-07
  Administered 2016-05-07: 40 meq via ORAL
  Filled 2016-05-07: qty 4

## 2016-05-07 MED ORDER — FEBUXOSTAT 40 MG PO TABS
40.0000 mg | ORAL_TABLET | Freq: Every day | ORAL | Status: DC
Start: 2016-05-07 — End: 2016-05-16
  Administered 2016-05-07 – 2016-05-16 (×10): 40 mg via ORAL
  Filled 2016-05-07 (×10): qty 1

## 2016-05-07 NOTE — Plan of Care (Signed)
Problem: Breathing Pattern - Ineffective  Goal: Arterial blood gas values and/or saturation return to baseline  Outcome: Progressing toward goal, anticipate improvement over: Next 24-48 hours  Monitored breathing and lung sounds. Patient instructed to report difficulty breathing/shortness of breath. Assessed patient for signs or symptoms of hypoxia. Patient reports some dyspnea with exertion. See assessment for pulmonary details. No acute respiratory distress noted. On 3L nasal cannula. On flolan  Per MAR and tolerating well.     Problem: Infection  Goal: Absence of infection signs and symptoms  Outcome: Progressing toward goal, anticipate improvement over: Next 24-48 hours  Educate regarding s/s of infection and instruct pt to report redness, warmth, discharge, pain and/or increased body temp. Please refer to patient lab results in chart. Pt remain afebrile. Medication given per MD and MAR orders.       Problem: Tissue Perfusion, Cardiopulmonary - Altered  Goal: Hemodynamic stability  Outcome: Progressing toward goal, anticipate improvement over: Next 24-48 hours  Please see pts doc flow for vitals. No s/s of distress at this time. Encouraged pt to notify nurse if these symptoms arise.Tele monitor on and audible. Will continue to montior and assess at bedside. Will update PRN with any changes.

## 2016-05-07 NOTE — Progress Notes (Addendum)
Pulmonary Vascular Service Progress Note    Date of Admission: 05/02/2016    Interval/Subjective:  R>L LE swelling persists.  Otherwise the patient feels well, breathing is more comfortable, less chest tightness.     Objective:  Current Facility-Administered Medications   Medication    azelastine (ASTELIN) 0.1 % nasal spray 1 spray    colchicine tablet 0.6 mg    epoprostenol (FLOLAN) 45,000 ng/mL in glycine diluent 100 mL infusion    ferrous sulfate EC tablet 235 mg    folic acid (FOLVITE) tablet 0.5 mg    furosemide (LASIX) injection 60 mg    Prostacyclin Cassette Change    Prostacyclin Ice Pack    Prostacyclin Tubing    Prostacylin Batteries    sildenafil (REVATIO, VIAGRA) tablet 80 mg    sodium chloride (PF) 0.9 % flush 3 mL    sodium chloride (PF) 0.9 % flush 3 mL    sodium chloride 0.9 % TKO infusion    spironolactone (ALDACTONE) tablet 50 mg       VS:  Temperature:  [98.2 F (36.8 C)-98.8 F (37.1 C)] 98.8 F (37.1 C) (04/25 0917)  Blood pressure (BP): (114-149)/(58-83) 148/78 (04/25 1205)  Heart Rate:  [83-90] 89 (04/25 1205)  Respirations:  [16-20] 18 (04/25 1205)  Pain Score: 0 (04/25 1205)  O2 Device: Nasal cannula (04/25 1205)  O2 Flow Rate (L/min):  [2 l/min-4 l/min] 3 l/min (04/25 1205)  SpO2:  [92 %-96 %] 96 % (04/25 1205)    Intake/Output Summary (Last 24 hours) at 05/07/16 1401  Last data filed at 05/07/16 1219   Gross per 24 hour   Intake              570 ml   Output             2700 ml   Net            -2130 ml     Exam:  Well appearing, NAD  RRR  Rales L base  Abd soft, + bowel sounds  LEs edematous  Skin flushed    Results reviewed, pertinent findings:  Lab Results   Component Value Date    WBC 4.1 05/07/2016    RBC 4.52 05/07/2016    HGB 10.9 (L) 05/07/2016    HCT 35.7 05/07/2016    MCV 79.0 05/07/2016    MCHC 30.5 (L) 05/07/2016    RDW 16.4 (H) 05/07/2016    PLT 111 (L) 05/07/2016    PLT 179 09/12/2008    MPV 9.6 05/07/2016     Lab Results   Component Value Date    NA 137  05/07/2016    K 3.5 05/07/2016    CL 98 05/07/2016    BICARB 27 05/07/2016    BUN 55 (H) 05/07/2016    CREAT 1.17 (H) 05/07/2016    GLU 126 (H) 05/07/2016    [place] 9.6 05/07/2016     Lab Results   Component Value Date    AST 17 05/02/2016    ALT 16 05/02/2016    ALK 169 (H) 05/02/2016    TP 8.4 (H) 05/02/2016    ALB 3.8 05/02/2016    TBILI 0.60 05/02/2016    DBILI 0.1 06/06/2011     Lab Results   Component Value Date    INR 1.6 05/07/2016     RPNA coronavirus positive  Cocci IgG was negative  C3/C4 normal  CT scan shows diffuse GG, micronodules, thin walled cysts.   PFTs show  mixed disease, obstruction appears worse than prior, otherwise no change since 2012    Assessment and Plan:  89F with SLE and PAH presenting with dyspnea. Active issues include...  1. Acute decompensated RH failure/volume overload  2. A/c hypoxemic respiratory failure  3. SLE  4. 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 Elevated uric acid levels    Will keep in house for ongoing iv diuretics, increase to tid.    Uric acid lvl is concerning, rheumatology would like to start uloric and may need prednisone if gout flares.    Needs potassium replacement today.    Will hold further VKA therapy as the indication is weak (PAH from prior toxin use).      Patient seen and discussed with Dr Cristal Deer.    Ocie Doyne, M.D.  Fellow  Department of Pulmonary and Critical Care Medicine  [phone]      ATTENDING PROGRESS NOTE ATTESTATION    Subjective  66 y/o F with known PAH from prior anorexigen use and CTD (SLE?) now here with worse DOE, "lung and heart" pains, LEx edema followed by abdominal bloating and no response to home increased diuretic dose.    I have reviewed the history.  Interval history:  Initially better on 4/23 with less edema and bloating and improved pains, but later in the day with worsening LEx edema and even worse yesterday. Back on IV diuretics and increased frequency. Feels a little less  edematous compared to the day prior. Responding to the IV Lasix. Tolerating POs, no new Sx, resolved pains    ROS  No SOB at rest, + as previously described DOE, no CP or syncope, + edema. No D, N, V.  A complete ROS was negative except as noted above.    Objective  NAD, RRR, CTAB, NT mild D, +Tunneled CVC in chest site is CDI, 1-2 RLEx edema to knee, +1 LLEx edema to ankle.   I have examined the patient and concur with the fellow exam.    CXR  No new imaging    Assessment and Plan  66 y/o F with known PAH from prior anorexigen use and CTD (SLE?) now here for a few days of worse DOE, above mentioned pains, LEx edema followed by abdominal bloating and no response to home increased diuretic dose.  1. PAH with volume overload:  - Initially improving with IV Lasix, then worse when attempted to switch to PO Lasix  - Continue TID IV Lasix for now, watch weights and UO, Bun/Cr  - Continue IV Epo and PO revatio  - Follow Cr and UO  2. DOE:  - Likely in the setting of #1  - Cocci titer is negative  3. 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 CTD:  - Positive dsDNA, suspect SLE  - Mild CRP elevation  - Seen by Rheum, appreciate recs  - Seems to have elevated uric acid and will try febuxostat  - Chest CT: mixed areas of fibrosis and occasional bullae/thin walled cysts  - PFTs with mixed picture, but more c/w restriction  - No need for steroids at this time    I agree with the fellow care plan.  See the fellow note for further details.    Francesca Jewett, MD  Pulmonary and Critical Care  PID [id number] / Pager [phone]

## 2016-05-07 NOTE — Progress Notes (Signed)
RHEUMATOLOGY INPATIENT PROGRESS NOTE  Requesting Physician: Diana Downs, MD  Reason for Consult: SLE  Date of Admission: 05/02/2016  Length of Stay: 1 Day 5 Hours    History Of Presenting Illness:   Diana Chambers is a pleasant 66 year old caucasian female with a history of SLE (+ANA/dsDNA, minimal prior symptoms, never on treatment) as well as pulmonary HTN (thought to be related to prior anorexigen use vs underlying CTD) with chronic hypoxia on home O2 and CKD currently admitted to the pulmonary service for hypoxemic respiratory failure and volume overload. Rheumatology has been consulted regarding possible contribution of SLE to her current presentation. Please see consultation note from 05/03/16 for details of initial presentation.    Interval hx:   No acute changes overnight. Foot feels about the same. Primary team escalating diuretic therapy.    Inpatient Medications:  Current Facility-Administered Medications   Medication Dose Route Frequency Provider Last Rate Last Dose    azelastine (ASTELIN) 0.1 % nasal spray 1 spray  1 spray Each Naris BID Diana Siren, MD   1 spray at 05/07/16 1610    colchicine tablet 0.6 mg  0.6 mg Oral Daily Diana Siren, MD   0.6 mg at 05/07/16 9604    epoprostenol (FLOLAN) 45,000 ng/mL in glycine diluent 100 mL infusion  29 ng/kg/min (Order-Specific) IntraVENOUS Continuous Papamatheakis, Pearson Forster, MD 3.33 mL/hr at 05/06/16 1438 29 ng/kg/min at 05/06/16 1438    ferrous sulfate EC tablet 324 mg  324 mg Oral BID Diana Daughters., MD   324 mg at 54/09/81 1914    folic acid (FOLVITE) tablet 0.5 mg  0.5 mg Oral Daily Diana Siren, MD   0.5 mg at 05/07/16 7829    furosemide (LASIX) injection 60 mg  60 mg IntraVENOUS TID Diana Daughters., MD   60 mg at 05/07/16 1210    Prostacyclin Cassette Change   Other Daily Papamatheakis, Pearson Forster, MD        Prostacyclin Ice Pack   Other Q6H Papamatheakis, Pearson Forster, MD         Prostacyclin Tubing   Other Once per day on Mon Wed Fri Papamatheakis, Pearson Forster, MD        Prostacylin Batteries   Other Once per day on Mon Papamatheakis, [place], MD        sildenafil (REVATIO, VIAGRA) tablet 80 mg  80 mg Oral TID Diana Siren, MD   80 mg at 05/07/16 1211    sodium chloride (PF) 0.9 % flush 3 mL  3 mL IntraVENOUS Q8H Diana Siren, MD   3 mL at 05/07/16 0604    sodium chloride (PF) 0.9 % flush 3 mL  3 mL IntraVENOUS PRN Diana Siren, MD   3 mL at 05/06/16 2053    sodium chloride 0.9 % TKO infusion   IntraVENOUS Continuous PRN Diana Siren, MD        spironolactone (ALDACTONE) tablet 50 mg  50 mg Oral Daily Diana Siren, MD   50 mg at 05/07/16 5621     Allergies:  Allergies   Allergen Reactions    Allopurinol Hives    Levaquin Swelling and Other     Severe joint pain      Ofloxacin Nausea and Vomiting    Chlorhexidine Itching    Sulfa Drugs Unspecified       Physical Exam:  Temperature:  [98.2 F (36.8 C)-98.8 F (37.1 C)] 98.8 F (37.1 C) (04/25  0240)  Blood pressure (BP): (114-149)/(58-83) 148/78 (04/25 1205)  Heart Rate:  [83-90] 89 (04/25 1205)  Respirations:  [16-20] 18 (04/25 1205)  Pain Score: 0 (04/25 1205)  O2 Device: Nasal cannula (04/25 1205)  O2 Flow Rate (L/min):  [2 l/min-4 l/min] 3 l/min (04/25 1205)  SpO2:  [92 %-96 %] 96 % (04/25 1205)    Gen: chronically ill-appearing WF appears stated age in NAD  HEENT: dry mucus membranes without ulcerations or trauma. No scleral icterus or injection. No alopecia.  CV: audible s1/s2 with RRR, II/VI systolic murmur loudest at LLSB without rubs/gallops.  Pulm: fine inspiratory crackles heard in all fields without significant tachypnea, wheezes or distress.  Extrem: 2+ edema bilateral LE to abdomen. +digital clubbing in all fingers. No cyanosis.  Skin: diffuse blanching macular erythema over face, arms, legs, moderately worsened since 05/03/16. Scattered peri-cuticular  scabs. No sclerodactyly or telangectasias.    Neuro: alert and appropriate in conversation, MAEW, no focal deficits    MSK:   No muscle tenderness, atrophy, or abnormal movements noted  Hands: no swelling or tenderness of MCPs, PIPs, or DIPs  Right foot: diffuse soft tissue edema with mild TTP over dorsal midfoot. No warmth, erythema or abnormal masses appreciated.    Pertinent Labs:  CBC  Lab Results   Component Value Date    WBC 4.1 05/07/2016    WBC 4.6 05/06/2016    HGB 10.9 (L) 05/07/2016    HGB 10.8 (L) 05/06/2016    HCT 35.7 05/07/2016    HCT 35.0 05/06/2016    PLT 111 (L) 05/07/2016    PLT 108 (L) 05/06/2016    PLT 179 09/12/2008    PLT 162 09/09/2008    BAND 1 05/02/2016    BAND 3 02/22/2013    SEG 61 05/07/2016    SEG 68 05/06/2016    LYMPHS 28 05/07/2016    LYMPHS 23 05/06/2016    MONOS 7 05/07/2016    MONOS 6 05/06/2016      Chemistry  Lab Results   Component Value Date    NA 137 05/07/2016    NA 135 (L) 05/06/2016    K 3.5 05/07/2016    K 3.7 05/06/2016    CL 98 05/07/2016    CL 100 05/06/2016    BICARB 27 05/07/2016    BICARB 27 05/06/2016    BUN 55 (H) 05/07/2016    BUN 53 (H) 05/06/2016    CREAT 1.17 (H) 05/07/2016    CREAT 1.31 (H) 05/06/2016    GLU 126 (H) 05/07/2016    GLU 102 (H) 05/06/2016    [place] 9.6 05/07/2016    [place] 9.7 05/06/2016    MG 2.2 05/07/2016    MG 2.1 05/06/2016    PHOS 2.9 05/02/2016    PHOS 2.9 12/21/2014     Lab Results   Component Value Date    ALK 169 (H) 05/02/2016    ALK 112 12/12/2015    AST 17 05/02/2016    AST 28 12/12/2015    ALT 16 05/02/2016    ALT 21 12/12/2015    TBILI 0.60 05/02/2016    TBILI 0.64 12/12/2015    DBILI 0.1 06/06/2011    DBILI 0.1 07/12/2009    ALB 3.8 05/02/2016    ALB 4.0 12/12/2015          Coags  Lab Results   Component Value Date    PT 17.5 (H) 05/07/2016    PT 16.7 (H) 05/06/2016    PTT  36.6 (H) 12/21/2014    PTT 39.1 (H) 02/20/2013    INR 1.6 05/07/2016    INR 1.6 05/06/2016     Lab Results   Component Value Date    ESR 36 (H) 05/02/2016    ESR 60  (H) 04/16/2011    ESR 37 (H) 06/26/2010     Results for Diana, Chambers (MRN [id number]) as of 05/03/2016 17:41   Ref. Range 06/26/2010 16:53 04/16/2011 15:30 08/11/2012 16:10 05/02/2016 20:36   Anti-Nuclear Ab Titer Latest Ref Range: <1:40 titer >1:640      Anti-DSDNA Latest Ref Range: 0 - 24 IU >300 (H) >200 (H) >200 (H)    C3 Latest Ref Range: 90 - 180 mg/dL 133 133 118 119   C4 Latest Ref Range: 10 - 40 mg/dL 19 19 20 18    SSA Ab Latest Ref Range: Negative  Negative      SSB Ab Latest Ref Range: Negative  Negative        Results for ENSLEE, BIBBINS (MRN [id number]) as of 05/07/2016 12:51   Ref. Range 05/04/2016 14:45   Cardiolipin Ab, IgA Latest Ref Range: 0 - 11 APL 0   Cardiolipin IgM, Ab Latest Ref Range: 0 - 12 MPL 0   Results for LUCRESHA, DISMUKE (MRN [id number]) as of 05/07/2016 12:51   Ref. Range 05/04/2016 14:45   Beta-2 Glycoprotein 1 Ab, IGG Latest Ref Range: 0 - 20 SGU 21 (H)   Results for LORI, POPOWSKI (MRN [id number]) as of 05/07/2016 12:51   Ref. Range 05/04/2016 14:45   Beta-2 Glycoprotein 1 Ab, IGM Latest Ref Range: 0 - 20 SMU 5       Results for ASTORIA, CONDON (MRN [id number]) as of 05/06/2016 13:24   Ref. Range 05/04/2016 14:45   RNP (Ribonucloprotein Ab) Latest Ref Range: 0.0 - 4.9 U/mL 2.4   Smith Ab Latest Ref Range: 0.0 - 6.9 U/mL 3.6   SSA Ab Latest Ref Range: 0.0 - 6.9 U/mL >240.0 (H)   SSB Ab Latest Ref Range: 0.0 - 6.9 U/mL 58.0 (H)       Results for HAVEN, PYLANT (MRN [id number]) as of 05/03/2016 17:41   Ref. Range 05/02/2016 22:55   Protein Latest Ref Range: Negative  1+ (A)   Glucose Latest Ref Range: Negative  Negative   Ketones Latest Ref Range: Negative  Negative   Leuk Esterase Latest Ref Range: Negative  Negative   Nitrite Latest Ref Range: Negative  Negative   Bilirubin Latest Ref Range: Negative  Negative   Blood Latest Ref Range: Negative  1+ (A)   Urobilinogen Latest Ref Range: Negative  Negative   WBC Latest Ref Range: 0-2/HPF  0-2   RBC Latest Ref Range:  0-2/HPF  0-2     Results for LIZ, PINHO (MRN [id number]) as of 05/07/2016 12:51   Ref. Range 05/06/2016 05:41   Uric Acid Latest Ref Range: 2.4 - 5.7 mg/dL 11.8 (H)       Pertinent Imaging:    CT chest 06/24/2010:  IMPRESSION:     Redemonstrated evidence of pulmonary hypertension. Also relatively unchanged   but extensive bilateral lung abnormalities, some of which may be seen with   pulmonary hypertension. However, follicular bronchiolitis / lymphocytic   interstitial pneumonitis is in the differential. If the pulmonary   hypertension is unexplained, then pulmonary veno-occlusive disease / capillary  hemangiomatosis may be considered.    CT chest wo contrast 05/04/16:  IMPRESSION:  1. Severe enlargement  of the pulmonary artery with increased calcification along the pulmonary arterial walls compatible with longstanding pulmonary hypertension. Progressed parenchymal changes of the lungs compatible with pulmonary hypertension .    2. Increased thin wall l cystic esions seen within the lungs, nonspecific be can be seen with underlying lymphocytic interstitial pneumonia, which can be seen in patients with lupus/Sjogren's disease.    3. Cardiomegaly with evidence of right heart dysfunction. Trace nonspecific pericardial fluid with pericardial thickening suspected. In a patient with known lupus underlying pericarditis/serositis may be present. Correlation with echocardiogram is recommended.    4. Likely reactive adenopathy of the thorax.    5. Additional findings as detailed above.      [place] 02/2016:        Assessment:  Jalexus Brett is a pleasant 66 year old caucasian female with a history of SLE (+ANA/dsDNA, minimal prior symptoms, never on treatment) as well as pulmonary HTN (thought to be related to prior anorexigen use vs underlying CTD) with chronic hypoxia on home O2 and CKD currently admitted to the pulmonary service for hypoxemic respiratory failure and volume overload. Rheumatology has been  consulted regarding possible contribution of SLE to her current presentation.    She has a history of SLE based on strongly-positive ANA and dsDNA but her history and exam do not reveal significant evidence of disease activity. Anti-phospholipid studies negative thus far. No evidence of lupus nephritis based on her UA. 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 Serologies to evaluate for scleroderma, dermatomyositis and UCTD/MCTD are pending. PFTs appear stable from prior studies. There is no indication for empiric steroids at this time, though if she were to worsen significantly then it may be indicated. Choice of definitive therapy (MMF vs AZA) can be safely deferred to the outpatient setting.    She has a history consistent with prior gout and may be starting to have a mild flare in her right foot (likely precipitated by diuresis). Colchicine may be helpful for prevention. Her uric acid is markedly elevated at 11.8. Ideally we would continue prophylactic colchicine and wait until her diuresis is complete prior to starting urate-lowering therapy (which may precipitate a flare), but she [place] miles away and we don't want her to flare at home. It may therefore be prudent to start febuxostat while she is here in the hospital for more diuresis so that if she flares we can respond to it quickly with prednisone.    Recommendations:  - Start febuxostat 40 mg daily. Discussed with patient the high probability of flare in the days after starting this, and she elects to proceed.  - Will treat potential flare with prednisone  - Would continue colchicine 0.6 mg daily for gout flare prophylaxis  - Will await finalization of SCL-70, [facility] myositis panel  - We have made arrangements for her to follow-up with our [place] [facility] within 4-8 weeks of  discharge.    Thank you for allowing [place] to participate in the care of this patient.  Please do not hesitate to call with any questions. Essential recommendations communicated to primary team via telephone.  Patient seen and discussed with attending rheumatologist Dr. Truman Hayward who concurs with the above.    Keturah Shavers MD  Rheumatology Fellow    ATTENDING PROGRESS NOTE ATTESTATION    Subjective    I have  reviewed the history.  Interval history:  SOB back to near baseline. 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 Repeat serologies strongly positive for dsdna/SSA/SSB. 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 Clinically no e/o scleroderma/CREST/ANCA vasculitis/RA which may contribute to ILD. Pt w/ sig PAH out of proportion to ILD. Repeat Chest CT w/ more conspicuous cystic changes but overall, about the same. H/o 1 clot per pt but no e/o APS (neg ACL Ab/beta2glycoprotein/DRVVT). H/o clinical gout (never crystal proven but h/o intermittent arthritis lasting few days and easily resolved w/ few days of steroids) associated with sig elevated serum uric acid 11.8. Most likely having minor gout flares intermittently, esp in setting of aggressive diuresis for her PAH. No e/o acute flare currently despite some joint pain. Started on colchicine prophylaxis yesterday and tolerating w/o difficulty    -pending myositis  panel and ILD panel which includes Jo and Scl-70 Ab  -can consider doing a trial of steroid if her SOB worsens or chest CT findings worsen significantly; but will hold off for now and will address as outpt r  -cont colchicine 0.62m po qdaily for prophylaxis  -given sig hyperuricemia, h/o clinical gout and persistent diuretic use, will start febuxostat 450mqdaily; pt aware that there's an increased risk of flare during first 3-6 months of initiating febuxostat; as pt will be inpt for additional few days, this would provide a good time to assess her tolerance to febuxostat [place]  -if she flares with initiation of febuxostat despite colchicine prophylaxis, would use pred taper (which may end up helping her ILD); pt intol to HLA [id number] due to rash  -risk of cardiac events d/w'ed pt but those noted in latest study were more CAD/AMI and not right heart issues; this pt has PAH w/ subsequent right heart dysfunction but no known CAD  -pt agreeable to f[facility] (either Dr. SiCandiss Norser myself who have Wed afternoon clinic) upon d/c; pt will call upon d/c to schedule appt [phone])  -[facility]  I agree with the fellowcare plan.

## 2016-05-07 NOTE — Plan of Care (Signed)
Problem: Falls, Risk of  Goal: Keep patient free from falls utilizing universal fall precautions  Patient verbalizes understanding of fall risk and calls for assistance oob. Patient has been oriented to room. Fall precautions implemented bed is in low/locked position with bed alarm on, call light & tray table within reach, side rails up x2. Wearing nonskid footwear and white board in use. Hourly and prn rounds to assess for patient needs/safety.  Patient room kept free and clear of clutter. No falls this shift.

## 2016-05-07 NOTE — Plan of Care (Signed)
Problem: Breathing Pattern - Ineffective  Goal: Respiratory rate, rhythm and depth return to baseline  Assessed and monitored pt for S/sx of resp distress q4hrs and PRN.  Resp even and unlabored, oxygen saturation 93-95% on oxygen at 4 LPM/NC, lung sounds diminished  at the bases, denies SOB. Cont to monitor.

## 2016-05-07 NOTE — Plan of Care (Signed)
Problem: Breathing Pattern - Ineffective  Goal: Arterial blood gas values and/or saturation return to baseline  Outcome: Goal Met    Goal: Respiratory rate, rhythm and depth return to baseline  Outcome: Progressing toward goal, anticipate improvement over: next 12-24 hours  Pt currently on 4L/min NC. At home pt on 2L/min NC. Pt reports that she feels her resp status has improved significantly since being admitted. Flolan continues without complications.   Goal: Minimal or absent use of accessory muscles  Outcome: Goal Met      Problem: Infection  Goal: Absence of infection signs and symptoms  Outcome: Goal Met    Goal: Perform proper infection control measures  Outcome: Goal Met      Problem: Tissue Perfusion, Cardiopulmonary - Altered  Goal: Hemodynamic stability  Outcome: Goal Met

## 2016-05-07 NOTE — Plan of Care (Signed)
Problem: Tissue Perfusion, Cardiopulmonary - Altered  Goal: Hemodynamic stability   Assessed heart sounds, lung sounds, and for presence of edema. Monitored for CP/pressure and instructed pt to call if CP/pressure occur. Tele monitoring. Monitored and recorded vital signs, O2 sats, I&O's, labs and weights as ordered and reported abnormal results to MD.  Tele NSR in the 90's, denies chest pain, pressure and palpitations.  Cont to monitor.

## 2016-05-08 DIAGNOSIS — J8410 Pulmonary fibrosis, unspecified: Secondary | ICD-10-CM

## 2016-05-08 DIAGNOSIS — M79641 Pain in right hand: Secondary | ICD-10-CM

## 2016-05-08 DIAGNOSIS — M25512 Pain in left shoulder: Secondary | ICD-10-CM

## 2016-05-08 DIAGNOSIS — L659 Nonscarring hair loss, unspecified: Secondary | ICD-10-CM

## 2016-05-08 DIAGNOSIS — I272 Pulmonary hypertension, unspecified: Secondary | ICD-10-CM

## 2016-05-08 DIAGNOSIS — M25412 Effusion, left shoulder: Secondary | ICD-10-CM

## 2016-05-08 DIAGNOSIS — E790 Hyperuricemia without signs of inflammatory arthritis and tophaceous disease: Secondary | ICD-10-CM

## 2016-05-08 DIAGNOSIS — M3213 Lung involvement in systemic lupus erythematosus: Secondary | ICD-10-CM

## 2016-05-08 LAB — BASIC METABOLIC PANEL, BLOOD
Anion Gap: 9 mmol/L (ref 7–15)
BUN: 52 mg/dL — ABNORMAL HIGH (ref 8–23)
Bicarbonate: 27 mmol/L (ref 22–29)
Calcium: 9.6 mg/dL (ref 8.5–10.6)
Chloride: 102 mmol/L (ref 98–107)
Creatinine: 1.11 mg/dL — ABNORMAL HIGH (ref 0.51–0.95)
GFR: 49 mL/min
Glucose: 109 mg/dL — ABNORMAL HIGH (ref 70–99)
Potassium: 3.8 mmol/L (ref 3.5–5.1)
Sodium: 138 mmol/L (ref 136–145)

## 2016-05-08 LAB — MDIFF
Bands: 7 % (ref 0–15)
Myelocytes: 1 %
Number of Cells Counted: 114
Plt Est: DECREASED

## 2016-05-08 LAB — CBC WITH DIFF, BLOOD
ANC-Manual Mode: 3 10*3/uL (ref 1.6–7.0)
Abs Eosinophils: 0.1 10*3/uL (ref 0.1–0.5)
Abs Lymphs: 0.8 10*3/uL (ref 0.8–3.1)
Abs Monos: 0.2 10*3/uL (ref 0.2–0.8)
Basophils: 1 %
Eosinophils: 3 %
Hct: 34.2 % (ref 34.0–45.0)
Hgb: 10.5 gm/dL — ABNORMAL LOW (ref 11.2–15.7)
Lymphocytes: 20 %
MCH: 24.2 pg — ABNORMAL LOW (ref 26.0–32.0)
MCHC: 30.7 g/dL — ABNORMAL LOW (ref 32.0–36.0)
MCV: 79 um3 (ref 79.0–95.0)
MPV: 9.3 fL — ABNORMAL LOW (ref 9.4–12.4)
Monocytes: 4 %
Plt Count: 102 10*3/uL — ABNORMAL LOW (ref 140–370)
RBC: 4.33 10*6/uL (ref 3.90–5.20)
RDW: 16.4 % — ABNORMAL HIGH (ref 12.0–14.0)
Segs: 64 %
WBC: 4.2 10*3/uL (ref 4.0–10.0)

## 2016-05-08 LAB — PROTHROMBIN TIME, BLOOD
INR: 1.7
PT,Patient: 17.9 s — ABNORMAL HIGH (ref 9.7–12.5)

## 2016-05-08 LAB — MAGNESIUM, BLOOD: Magnesium: 2.2 mg/dL (ref 1.6–2.4)

## 2016-05-08 LAB — SCL-70 ANTIBODY, BLOOD: SCL-70 Ab: 2 AU/mL (ref 0–40)

## 2016-05-08 LAB — CARDIOLIPIN ANTIBODY, IGG, BLOOD: Cardiolipin IgG, Ab: <10 [GPL'U] (ref 0–14)

## 2016-05-08 LAB — JO-1 ANTIBODY, BLOOD: Jo-1 Antibody: 0 AU/mL (ref 0–40)

## 2016-05-08 MED ORDER — POTASSIUM CHLORIDE CRYS CR 10 MEQ OR TBCR
20.0000 meq | EXTENDED_RELEASE_TABLET | Freq: Once | ORAL | Status: AC
Start: 2016-05-08 — End: 2016-05-08
  Administered 2016-05-08: 20 meq via ORAL
  Filled 2016-05-08: qty 2

## 2016-05-08 NOTE — Plan of Care (Signed)
Problem: Breathing Pattern - Ineffective  Goal: Respiratory rate, rhythm and depth return to baseline  Assessed and monitored pt for S/sx of resp distress q4hrs and PRN.  Resp even and unlabored, oxygen saturation 93-97% on oxygen at 4 LPM/NC, lung sounds diminished  at the bases, denies SOB. Cont to monitor.      Problem: Tissue Perfusion, Cardiopulmonary - Altered  Goal: Hemodynamic stability   Assessed heart sounds, lung sounds, and for presence of edema. Monitored for CP/pressure and instructed pt to call if CP/pressure occur. Tele monitoring. Monitored and recorded vital signs, O2 sats, I&O's, labs and weights as ordered and reported abnormal results to MD.  Tele NSR in the 80's, denies chest pain, pressure and palpitations.  Cont to monitor.           Problem: Falls, Risk of  Goal: Keep patient free from falls utilizing universal fall precautions  Patient verbalizes understanding of fall risk and calls for assistance oob. Patient has been oriented to room. Fall precautions implemented bed is in low/locked position with bed alarm on, call light & tray table within reach, side rails up x2. Wearing nonskid footwear and white board in use. Hourly and prn rounds to assess for patient needs/safety.  Patient room kept free and clear of clutter. No falls this shift.

## 2016-05-08 NOTE — Progress Notes (Signed)
RHEUMATOLOGY INPATIENT PROGRESS NOTE  Requesting Physician: Corky Downs, MD  Reason for Consult: SLE  Date of Admission: 05/02/2016  Length of Stay: 1 Day 5 Hours    History Of Presenting Illness:   Diana Chambers is a pleasant 66 year old caucasian female with a history of SLE (+ANA/dsDNA, minimal prior symptoms, never on treatment) as well as pulmonary HTN (thought to be related to prior anorexigen use vs underlying CTD) with chronic hypoxia on home O2 and CKD currently admitted to the pulmonary service for hypoxemic respiratory failure and volume overload. Rheumatology has been consulted regarding possible contribution of SLE to her current presentation. Please see consultation note from 05/03/16 for details of initial presentation.    Interval hx:   No acute changes overnight. No difference in very mild right foot pain. Able to ambulate without difficulty. Received febuxostat yesterday. Feels her generalized swelling is somewhat better.    Inpatient Medications:  Current Facility-Administered Medications   Medication Dose Route Frequency Provider Last Rate Last Dose    azelastine (ASTELIN) 0.1 % nasal spray 1 spray  1 spray Each Naris BID Marcy Siren, MD   1 spray at 05/08/16 8921    colchicine tablet 0.6 mg  0.6 mg Oral Daily Marcy Siren, MD   0.6 mg at 05/08/16 0809    epoprostenol (FLOLAN) 45,000 ng/mL in glycine diluent 100 mL infusion  29 ng/kg/min (Order-Specific) IntraVENOUS Continuous Papamatheakis, Pearson Forster, MD 3.33 mL/hr at 05/08/16 0758 29 ng/kg/min at 05/08/16 0758    febuxostat (ULORIC) tablet 40 mg  40 mg Oral Daily Marcy Siren, MD   40 mg at 05/08/16 0809    ferrous sulfate EC tablet 324 mg  324 mg Oral BID Larna Daughters., MD   324 mg at 19/41/74 0814    folic acid (FOLVITE) tablet 0.5 mg  0.5 mg Oral Daily Marcy Siren, MD   0.5 mg at 05/08/16 0810    furosemide (LASIX) injection 60 mg  60 mg IntraVENOUS TID Larna Daughters., MD   60  mg at 05/08/16 0809    Prostacyclin Cassette Change   Other Daily Papamatheakis, Pearson Forster, MD        Prostacyclin Ice Pack   Other Q6H Papamatheakis, Pearson Forster, MD   2 each at 05/08/16 0600    Prostacyclin Tubing   Other Once per day on Mon Wed Fri Papamatheakis, Pearson Forster, MD        Prostacylin Batteries   Other Once per day on Mon Papamatheakis, [place], MD        sildenafil (REVATIO, VIAGRA) tablet 80 mg  80 mg Oral TID Marcy Siren, MD   80 mg at 05/08/16 4818    sodium chloride (PF) 0.9 % flush 3 mL  3 mL IntraVENOUS Q8H Marcy Siren, MD   3 mL at 05/08/16 0515    sodium chloride (PF) 0.9 % flush 3 mL  3 mL IntraVENOUS PRN Marcy Siren, MD   3 mL at 05/06/16 2053    sodium chloride 0.9 % TKO infusion   IntraVENOUS Continuous PRN Marcy Siren, MD        spironolactone (ALDACTONE) tablet 50 mg  50 mg Oral Daily Marcy Siren, MD   50 mg at 05/08/16 5631     Allergies:  Allergies   Allergen Reactions    Allopurinol Hives    Levaquin Swelling and Other     Severe joint pain  Ofloxacin Nausea and Vomiting   • Chlorhexidine Itching   • Sulfa Drugs Unspecified       Physical Exam:  Temperature:  [97.7 °F (36.5 °C)-98.9 °F (37.2 °C)] 98.9 °F (37.2 °C) (04/26 0758)  Blood pressure (BP): (131-148)/(63-79) 131/71 (04/26 0758)  Heart Rate:  [87-97] 89 (04/26 1023)  Respirations:  [18-22] 18 (04/26 1023)  Pain Score: 0 (04/26 0758)  O2 Device: Nasal cannula (04/26 1018)  O2 Flow Rate (L/min):  [3 l/min-4 l/min] 4 l/min (04/26 1018)  SpO2:  [91 %-96 %] 94 % (04/26 1023)    Gen: chronically ill-appearing WF appears stated age in NAD  HEENT: dry mucus membranes without ulcerations or trauma. No scleral icterus or injection. No alopecia.  Extrem: 2+ edema bilateral LE to abdomen. +digital clubbing in all fingers. No cyanosis.  Skin: diffuse blanching macular erythema over face, arms, legs, moderately worsened since 05/03/16 but  stable from yesterday. Scattered peri-cuticular scabs. No sclerodactyly or telangectasias.    Neuro: alert and appropriate in conversation, MAEW, no focal deficits    MSK:   No muscle tenderness, atrophy, or abnormal movements noted  Hands: no swelling or tenderness of MCPs, PIPs, or DIPs  Right foot: diffuse soft tissue edema with mild TTP over dorsal midfoot. No warmth, erythema or abnormal masses appreciated.    Pertinent Labs:  CBC  Lab Results   Component Value Date    WBC 4.2 05/08/2016    WBC 4.1 05/07/2016    HGB 10.5 (L) 05/08/2016    HGB 10.9 (L) 05/07/2016    HCT 34.2 05/08/2016    HCT 35.7 05/07/2016    PLT 102 (L) 05/08/2016    PLT 111 (L) 05/07/2016    PLT 179 09/12/2008    PLT 162 09/09/2008    BAND 7 05/08/2016    BAND 1 05/02/2016    SEG 64 05/08/2016    SEG 61 05/07/2016    LYMPHS 20 05/08/2016    LYMPHS 28 05/07/2016    MONOS 4 05/08/2016    MONOS 7 05/07/2016      Chemistry  Lab Results   Component Value Date    NA 138 05/08/2016    NA 137 05/07/2016    K 3.8 05/08/2016    K 3.5 05/07/2016    CL 102 05/08/2016    CL 98 05/07/2016    BICARB 27 05/08/2016    BICARB 27 05/07/2016    BUN 52 (H) 05/08/2016    BUN 55 (H) 05/07/2016    CREAT 1.11 (H) 05/08/2016    CREAT 1.17 (H) 05/07/2016    GLU 109 (H) 05/08/2016    GLU 126 (H) 05/07/2016    CA 9.6 05/08/2016    CA 9.6 05/07/2016    MG 2.2 05/08/2016    MG 2.2 05/07/2016    PHOS 2.9 05/02/2016    PHOS 2.9 12/21/2014     Lab Results   Component Value Date    ALK 169 (H) 05/02/2016    ALK 112 12/12/2015    AST 17 05/02/2016    AST 28 12/12/2015    ALT 16 05/02/2016    ALT 21 12/12/2015    TBILI 0.60 05/02/2016    TBILI 0.64 12/12/2015    DBILI 0.1 06/06/2011    DBILI 0.1 07/12/2009    ALB 3.8 05/02/2016    ALB 4.0 12/12/2015          Coags  Lab Results   Component Value Date    PT 17.9 (H)   05/08/2016    PT 17.5 (H) 05/07/2016    PTT 36.6 (H) 12/21/2014    PTT 39.1 (H) 02/20/2013    INR 1.7 05/08/2016    INR 1.6 05/07/2016     Lab Results   Component  Value Date    ESR 36 (H) 05/02/2016    ESR 60 (H) 04/16/2011    ESR 37 (H) 06/26/2010     Results for Casale, Genea LEE (MRN [id number]) as of 05/03/2016 17:41   Ref. Range 06/26/2010 16:53 04/16/2011 15:30 08/11/2012 16:10 05/02/2016 20:36   Anti-Nuclear Ab Titer Latest Ref Range: <1:40 titer >1:640      Anti-DSDNA Latest Ref Range: 0 - 24 IU >300 (H) >200 (H) >200 (H)    C3 Latest Ref Range: 90 - 180 mg/dL 133 133 118 119   C4 Latest Ref Range: 10 - 40 mg/dL 19 19 20 18   SSA Ab Latest Ref Range: Negative  Negative      SSB Ab Latest Ref Range: Negative  Negative        Results for Shuffler, Ellyce LEE (MRN [id number]) as of 05/07/2016 12:51   Ref. Range 05/04/2016 14:45   Cardiolipin Ab, IgA Latest Ref Range: 0 - 11 APL 0   Cardiolipin IgM, Ab Latest Ref Range: 0 - 12 MPL 0   Results for Tangen, Paulette LEE (MRN [id number]) as of 05/07/2016 12:51   Ref. Range 05/04/2016 14:45   Beta-2 Glycoprotein 1 Ab, IGG Latest Ref Range: 0 - 20 SGU 21 (H)   Results for Kernan, Julicia LEE (MRN [id number]) as of 05/07/2016 12:51   Ref. Range 05/04/2016 14:45   Beta-2 Glycoprotein 1 Ab, IGM Latest Ref Range: 0 - 20 SMU 5       Results for Kurkowski, Allyssa LEE (MRN [id number]) as of 05/06/2016 13:24   Ref. Range 05/04/2016 14:45   RNP (Ribonucloprotein Ab) Latest Ref Range: 0.0 - 4.9 U/mL 2.4   Smith Ab Latest Ref Range: 0.0 - 6.9 U/mL 3.6   SSA Ab Latest Ref Range: 0.0 - 6.9 U/mL >240.0 (H)   SSB Ab Latest Ref Range: 0.0 - 6.9 U/mL 58.0 (H)       Results for Thebeau, Erionna LEE (MRN [id number]) as of 05/03/2016 17:41   Ref. Range 05/02/2016 22:55   Protein Latest Ref Range: Negative  1+ (A)   Glucose Latest Ref Range: Negative  Negative   Ketones Latest Ref Range: Negative  Negative   Leuk Esterase Latest Ref Range: Negative  Negative   Nitrite Latest Ref Range: Negative  Negative   Bilirubin Latest Ref Range: Negative  Negative   Blood Latest Ref Range: Negative  1+ (A)   Urobilinogen Latest Ref Range: Negative  Negative   WBC Latest Ref  Range: 0-2/HPF  0-2   RBC Latest Ref Range: 0-2/HPF  0-2     Results for Riemann, Anagha LEE (MRN [id number]) as of 05/07/2016 12:51   Ref. Range 05/06/2016 05:41   Uric Acid Latest Ref Range: 2.4 - 5.7 mg/dL 11.8 (H)       Pertinent Imaging:    CT chest 06/24/2010:  IMPRESSION:      Redemonstrated evidence of pulmonary hypertension.  Also relatively unchanged   but extensive bilateral lung abnormalities, some of which may be seen with   pulmonary hypertension.  However, follicular bronchiolitis / lymphocytic   interstitial pneumonitis is in the differential.  If the pulmonary   hypertension is unexplained, then pulmonary veno-occlusive disease / capillary   hemangiomatosis may be considered.      CT chest wo contrast 05/04/16:  IMPRESSION:  1. Severe enlargement of the pulmonary artery with increased calcification along the pulmonary arterial walls compatible with longstanding pulmonary hypertension. Progressed parenchymal changes of the lungs compatible with pulmonary hypertension .    2. Increased thin wall l cystic esions seen within the lungs, nonspecific be can be seen with underlying lymphocytic interstitial pneumonia, which can be seen in patients with lupus/Sjogren's disease.    3. Cardiomegaly with evidence of right heart dysfunction. Trace nonspecific pericardial fluid with pericardial thickening suspected. In a patient with known lupus underlying pericarditis/serositis may be present. Correlation with echocardiogram is recommended.    4. Likely reactive adenopathy of the thorax.    5. Additional findings as detailed above.      [place] 02/2016:        Assessment:  Diana Chambers is a pleasant 66 year old caucasian female with a history of SLE (+ANA/dsDNA, minimal prior symptoms, never on treatment) as well as pulmonary HTN (thought to be related to prior anorexigen use vs underlying CTD) with chronic hypoxia on home O2 and CKD currently admitted to the pulmonary service for hypoxemic respiratory failure and  volume overload. Rheumatology has been consulted regarding possible contribution of SLE to her current presentation.    She has a history of SLE based on strongly-positive ANA and dsDNA but her history and exam do not reveal significant evidence of disease activity. Anti-phospholipid studies negative thus far. No evidence of lupus nephritis based on her UA. 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 Serologies to evaluate for scleroderma, dermatomyositis and UCTD/MCTD are pending. PFTs appear stable from prior studies. There is no indication for empiric steroids at this time, though if she were to worsen significantly then it may be indicated. Choice of definitive therapy (MMF vs AZA) can be safely deferred to the outpatient setting.    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 Colchicine may be helpful for prevention. Her flares have responded well to steroids in the past. Her uric acid is markedly elevated at 11.8. Ideally we would continue prophylactic colchicine and wait until her diuresis is complete prior to starting urate-lowering therapy (which may precipitate a flare), but she [place] miles away and we don't want her to flare at home. It may therefore be prudent to start febuxostat while she is here in the hospital for more diuresis so that if she flares we can respond to it quickly with prednisone.    Recommendations:  - Continue febuxostat 40 mg daily. Discussed with patient the high probability of flare in the days after starting this, and she elects to proceed.   - Also counseled on potential CV mortality risks with febuxostat (though these are  primarily CAD-related, not significantly associated with right-heart failure as in her case).  - Will treat potential flare with prednisone as indicated. If discharge contemplated soon, may send home with short course of steroid to take PRN.  - Would continue colchicine 0.6 mg daily for gout flare prophylaxis  - Will await finalization of SCL-70, [facility] myositis panel  - We have made arrangements for her to follow-up with our [facility] arthritis clinic within 4-8  weeks of discharge.    Thank you for allowing [place] to participate in the care of this patient.  Please do not hesitate to call with any questions.  Patient discussed with attending rheumatologist Dr. Candiss Norse who concurs with the above.    Keturah Shavers MD  Rheumatology Fellow

## 2016-05-08 NOTE — Plan of Care (Signed)
Problem: Breathing Pattern - Ineffective  Goal: Respiratory rate, rhythm and depth return to baseline  Outcome: Progressing toward goal, anticipate improvement over: next 12-24 hours  RR, rhythm and depth WNL. No c/o SOB. On supplemental O2 to maintain sats. Goal ongoing.    Problem: Infection  Goal: Absence of infection signs and symptoms  Outcome: Resolved Date Met: 05/08/16    Goal: Perform proper infection control measures  Outcome: Resolved Date Met: 05/08/16      Problem: Tissue Perfusion, Cardiopulmonary - Altered  Goal: Hemodynamic stability  Outcome: Goal Met  Flolan infusing as ordered. VSS. Pt alert and oriented, tissue wwp. Will continue to monitor and intervene as needed.      Problem: Falls, Risk of  Goal: Keep patient free from falls utilizing universal fall precautions  Outcome: Goal Met  Universal precautions in place. Pt ambulates with steady gate.

## 2016-05-08 NOTE — Progress Notes (Signed)
Pulmonary Vascular Service Progress Note    Date of Admission: 05/02/2016    Interval/Subjective:  R>L LE swelling persists though improved.    More comfortable breathing.  No increased/focal joint pain.    Objective:  Current Facility-Administered Medications   Medication    azelastine (ASTELIN) 0.1 % nasal spray 1 spray    colchicine tablet 0.6 mg    epoprostenol (FLOLAN) 45,000 ng/mL in glycine diluent 100 mL infusion    febuxostat (ULORIC) tablet 40 mg    ferrous sulfate EC tablet 235 mg    folic acid (FOLVITE) tablet 0.5 mg    furosemide (LASIX) injection 60 mg    Prostacyclin Cassette Change    Prostacyclin Ice Pack    Prostacyclin Tubing    Prostacylin Batteries    sildenafil (REVATIO, VIAGRA) tablet 80 mg    sodium chloride (PF) 0.9 % flush 3 mL    sodium chloride (PF) 0.9 % flush 3 mL    sodium chloride 0.9 % TKO infusion    spironolactone (ALDACTONE) tablet 50 mg       VS:  Temperature:  [97.7 F (36.5 C)-98.9 F (37.2 C)] 98.3 F (36.8 C) (04/26 1505)  Blood pressure (BP): (131-144)/(63-77) 133/63 (04/26 1505)  Heart Rate:  [88-97] 90 (04/26 1505)  Respirations:  [18-22] 20 (04/26 1505)  Pain Score: 0 (04/26 1505)  O2 Device: Nasal cannula (04/26 1124)  O2 Flow Rate (L/min):  [3 l/min-4 l/min] 4 l/min (04/26 1124)  SpO2:  [91 %-96 %] 95 % (04/26 1505)    Intake/Output Summary (Last 24 hours) at 05/08/16 1817  -1.9L    Exam:  Well appearing, NAD  RRR  Rales L base  Abd soft, + bowel sounds  LEs edematous, R>L  Skin flushed    Results reviewed, pertinent findings:  Lab Results   Component Value Date    WBC 4.2 05/08/2016    RBC 4.33 05/08/2016    HGB 10.5 (L) 05/08/2016    HCT 34.2 05/08/2016    MCV 79.0 05/08/2016    MCHC 30.7 (L) 05/08/2016    RDW 16.4 (H) 05/08/2016    PLT 102 (L) 05/08/2016    PLT 179 09/12/2008    MPV 9.3 (L) 05/08/2016     Lab Results   Component Value Date    NA 138 05/08/2016    K 3.8 05/08/2016    CL 102 05/08/2016    BICARB 27 05/08/2016    BUN 52 (H)  05/08/2016    CREAT 1.11 (H) 05/08/2016    GLU 109 (H) 05/08/2016     9.6 05/08/2016     Lab Results   Component Value Date    AST 17 05/02/2016    ALT 16 05/02/2016    ALK 169 (H) 05/02/2016    TP 8.4 (H) 05/02/2016    ALB 3.8 05/02/2016    TBILI 0.60 05/02/2016    DBILI 0.1 06/06/2011     Lab Results   Component Value Date    INR 1.7 05/08/2016     RPNA coronavirus positive  Cocci IgG was negative  C3/C4 normal  CT scan shows diffuse GG, micronodules, thin walled cysts.   PFTs show mixed disease, obstruction appears worse than prior, otherwise no change since 2012    Assessment and Plan:  66F with SLE and PAH presenting with dyspnea. Active issues include...  1. Acute decompensated RH failure/volume overload  2. A/c hypoxemic respiratory failure  3. SLE  4. Pulmonary arterial hypertension 2/2 CTD and  prior anorexigen use  5. Elevated uric acid levels    Will keep in house for ongoing iv diuretics, doing well on tid dosing.   Follow for syx of joint pain, on colchicine and uloric.    Ongoing potassium repletion.  Off VKA therapy as the indication is weak (PAH from prior toxin use).    SCDs.  Full Code and Care.  Dispo is pending improvement in volume status.    Patient seen and discussed with Dr Cristal Deer.    Ocie Doyne, M.D.  Fellow  Department of Pulmonary and Critical Care Medicine  [phone]        ATTENDING PROGRESS NOTE ATTESTATION    Subjective  66 y/o F with known PAH from prior anorexigen use and CTD (SLE?) now here with worse DOE, "lung and heart" pains, LEx edema followed by abdominal bloating and no response to home increased diuretic dose.    I have reviewed the history.  Interval history:  Initially better on 4/23 with less edema and bloating and improved pains, but later in the day with worsening LEx edema and even worse yesterday. Feels like she is urinating a lot, improving edema, but still present. Seems to be esponding to the IV Lasix. Tolerating POs, no new Sx, resolved  pains    ROS  No SOB at rest, +DOE, no CP, syncope, + edema. Some incisional pain, no D, N, V.  A complete ROS was negative except as noted above.    Objective  RRR + S2, CTAB, NTND, +1 BLEx edema.  I have examined the patient and concur with the fellow exam.    CXR  No new imaging    Assessment and Plan  66 y/o F with known PAH from prior anorexigen use and CTD (SLE?) now here for a few days of worse DOE, above mentioned pains, LEx edema followed by abdominal bloating and no response to home increased diuretic dose.  1. PAH with volume overload:  - Continued IV diuretics for now  - Continue IV Epo and PO revatio  - Follow Cr and UO  2. DOE:  - Likely in the setting of #1  3. 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 CTD:  - Positive dsDNA, suspect SLE  - Mild CRP elevation  - Seen by Rheum, appreciate recs  - No need for steroids at this time    I agree with the fellow care plan.  See the fellow note for further details.    Francesca Jewett, MD  Pulmonary and Critical Care  PID [id number] / Pager [phone]

## 2016-05-09 MED ORDER — DOCUSATE SODIUM 250 MG OR CAPS
250.0000 mg | ORAL_CAPSULE | Freq: Every day | ORAL | Status: DC
Start: 2016-05-09 — End: 2016-05-16
  Administered 2016-05-11 – 2016-05-16 (×6): 250 mg via ORAL
  Filled 2016-05-09 (×7): qty 1

## 2016-05-09 MED ORDER — ACETAMINOPHEN 325 MG PO TABS
650.0000 mg | ORAL_TABLET | Freq: Four times a day (QID) | ORAL | Status: DC | PRN
Start: 2016-05-09 — End: 2016-05-16
  Administered 2016-05-09 – 2016-05-14 (×2): 650 mg via ORAL
  Filled 2016-05-09 (×2): qty 2

## 2016-05-09 NOTE — Interdisciplinary (Signed)
Clinical Nutrition Screen:    Assessment: Per MD: 85 F former smoker with SLE, chronic hypoxemic respiratory failure on 2 LPM baseline home 02, and PAH 2/2 CTD and prior anorexigen use presents for eval of dyspnea.   Past Medical History:   Diagnosis Date    Pulmonary HTN     Sjoegren syndrome (CMS-HCC) 07/12/2009      No past surgical history on file.       Anthropometrics   Height: 5\' 2"  (157.5 cm)   Weight: 74.3 kg (163 lb 12.8 oz)      IBW= 110 lb  %IBW= 148%  Body mass index is 29.96 kg/(m^2).   UBW: 161 lbs   Weights (last 14 days)     Date/Time Weight Weight Source Percentage Weight Change (%) Who    05/09/16 0328 74.3 kg (163 lb 12.8 oz) Standing scale 0.99 % EJ    05/02/16 1159 73.6 kg (162 lb 3.2 oz) Standing scale 0 % EC               Diet:Diet Custom: 2 Gram Sodium  PO Intake: pt reports her appetite is good, pt is consuming an average of 98% x 12 meals, no barriers were identified at this time   Tray Items Taken for the past 168 hrs:   Number of Items Taken Number of Items on Tray Diet Tolerance   05/03/16 1000 4 4 Tolerates   05/03/16 1400 4 4 Tolerates   05/03/16 1713 2 2 Tolerates   05/04/16 1000 3 3 Tolerates   05/04/16 1400 - - Tolerates   05/04/16 2100 - - Tolerates   05/05/16 0900 3 3 Tolerates   05/05/16 1400 3 3 Tolerates   05/07/16 1000 4 4 Tolerates   05/07/16 1219 4 4 Tolerates   05/07/16 1800 7 7 Tolerates   05/08/16 0900 3 3 Tolerates   05/08/16 1222 4 5 Tolerates   05/09/16 1000 3 3 Tolerates     No data found.      Allergies/Intolerances:   Allergies   Allergen Reactions    Allopurinol Hives    Levaquin Swelling and Other     Severe joint pain      Ofloxacin Nausea and Vomiting    Chlorhexidine Itching    Sulfa Drugs Unspecified     Chewing/Swallowing Difficulty: pt denies chewing and swallowing difficulty   GI: no nausea,dairrhea and vomiting  Skin: WDL from head to toe assessment   Patient Lines/Drains/Airways Status    Active PICC Line / CVC Line / PIV Line / Drain / Airway /  Intraosseous Line / Epidural Line / ART Line / Line Type / Wound     Name: Placement date: Placement time: Site: Days:    CVC Single Lumen - Broviac Left Subclavian       Subclavian       Peripheral IV - 20 G Right Wrist 05/08/16   1430   Wrist   less than 1                 Labs: No results found for: PREALB, A1C  Recent Labs      05/07/16   0538  05/08/16   0627   NA  137  138   K  3.5  3.8   CL  98  102   BICARB  27  27   BUN  55*  52*   CREAT  1.17*  1.11*   GLU  126*  109*  [place]  9.6  9.6   MG  2.2  2.2       BS POCT: No results for input(s): GLUCPOCT in the last 72 hours.    Nutr Relevant MEDS:   Current Facility-Administered Medications   Medication    acetaminophen (TYLENOL) tablet 650 mg    azelastine (ASTELIN) 0.1 % nasal spray 1 spray    colchicine tablet 0.6 mg    docusate sodium (COLACE) capsule 250 mg    epoprostenol (FLOLAN) 45,000 ng/mL in glycine diluent 100 mL infusion    febuxostat (ULORIC) tablet 40 mg    ferrous sulfate EC tablet 446 mg    folic acid (FOLVITE) tablet 0.5 mg    furosemide (LASIX) injection 60 mg    Prostacyclin Cassette Change    Prostacyclin Ice Pack    Prostacyclin Tubing    Prostacylin Batteries    sildenafil (REVATIO, VIAGRA) tablet 80 mg    sodium chloride (PF) 0.9 % flush 3 mL    sodium chloride (PF) 0.9 % flush 3 mL    sodium chloride 0.9 % TKO infusion    spironolactone (ALDACTONE) tablet 50 mg       Education: if/when appropriate     PLAN: Will continue to monitor PO intake and diet tolerance. Will relay relevant, significant pt info and status change to Registered Dietitian.    Alice Rieger, MS, DTR

## 2016-05-09 NOTE — Progress Notes (Signed)
PULMONARY/CRITICAL CARE ATTENDING NOTE  Current Hospital 6 Days 23 Hours  Subjective:   Edema improving but still present.    Medications:  Scheduled Meds   azelastine  1 spray BID    colchicine  0.6 mg Daily    docusate sodium  250 mg Daily    febuxostat  40 mg Daily    ferrous sulfate  322 mg BID    folic acid  0.5 mg Daily    furosemide  60 mg TID    Prostacyclin Cassette Change   Daily    Prostacyclin Ice Pack   Q6H    Prostacyclin Tubing   Once per day on Mon Wed Fri    Prostacylin Batteries   Once per day on Mon    sildenafil  80 mg TID    sodium chloride (PF)  3 mL Q8H    spironolactone  50 mg Daily     PRN Meds   acetaminophen  650 mg Q6H PRN    sodium chloride (PF)  3 mL PRN    sodium chloride   Continuous PRN     IV Meds   epoprostenol (FLOLAN) infusion 29 ng/kg/min (05/09/16 0845)    sodium chloride         Vitals:    Latest Entry  Range (last 24 hours)    Temperature: 98.8 F (37.1 C)  Temp  Avg: 98.3 F (36.8 C)  Min: 97.8 F (36.6 C)  Max: 98.8 F (37.1 C)    Blood pressure (BP): 132/72  BP  Min: 131/59  Max: 141/72    Heart Rate: 89  Pulse  Avg: 89.6  Min: 86  Max: 93    Respirations: 20  Resp  Avg: 20.5  Min: 20  Max: 22    SpO2: 96 %  SpO2  Avg: 95.8 %  Min: 94 %  Max: 98 %       No Data Recorded     Weight: 74.3 kg (163 lb 12.8 oz)  Percentage Weight Change (%): 0.99 %       Intake/Output Summary (Last 24 hours) at 05/09/16 1121  Last data filed at 05/09/16 1000   Gross per 24 hour   Intake             2040 ml   Output             3200 ml   Net            -1160 ml       Exam:   Gen: NAD, comfortable in bed  NECK: + JVD  CV: RRR, + TR murmur no S3  RESP: Rales 1/3 up lung fields bilat  ABD: moderate distention, non tender  EXT: 1+ edema  NEURO: non-focal, grossly intact    Labs:   CBC  Recent Labs      05/07/16   0538  05/08/16   0627   WBC  4.1  4.2   HGB  10.9*  10.5*   HCT  35.7  34.2   PLT  111*  102*   BAND   --   7   SEG  61  64   LYMPHS  28  20   MONOS  7  4         Chemistry  Recent Labs      05/07/16   0538  05/08/16   0627   NA  137  138   K  3.5  3.8  CL  98  102   BICARB  27  27   BUN  55*  52*   CREAT  1.17*  1.11*   GLU  126*  109*   [place]  9.6  9.6   MG  2.2  2.2     Coags  Recent Labs      05/07/16   0538  05/08/16   0627   PT  17.5*  17.9*   INR  1.6  1.7       ASSESSMENT/PLAN:  66 yo F with 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  Trigger for decompensation unclear - may be related to concurrent viral infection.   Symptoms have improved with diuresis but she remains with  1+ pitting LE edema.      -Continue with IV diuretics until euvolemic  -Once euvolemic will transition to oral diuretic regimen  -Continue IV epo @ 29 ng and sildenafil 80 mg TID  -Appreciate rheum input - no role for immunosuppression at this time.  -Continue colchicine and uloric for gout  -Continue supplemental iron for anemia   -CT scan and PFT's with minimal change/deterioration over 5+ years.  No role for medical intervention at this time.  -d/c warfarin therapy.There is limited evidence to support the use of Vit K A therapy in [place] related to CTD so this may be stopped unless there is a secondary indication.      # Code Status:  Full Code    [name] M.D.  Pulmonary and Critical Care Attending  Pager [phone]

## 2016-05-09 NOTE — Progress Notes (Signed)
RHEUMATOLOGY INPATIENT PROGRESS NOTE  Requesting Physician: Corky Downs, MD  Reason for Consult: SLE  Date of Admission: 05/02/2016  Length of Stay: 1 Day 5 Hours    History Of Presenting Illness:   Diana Chambers is a pleasant 66 year old caucasian female with a history of SLE (+ANA/dsDNA, minimal prior symptoms, never on treatment) as well as pulmonary HTN (thought to be related to prior anorexigen use vs underlying CTD) with chronic hypoxia on home O2 and CKD currently admitted to the pulmonary service for hypoxemic respiratory failure and volume overload. Rheumatology has been consulted regarding possible contribution of SLE to her current presentation. Please see consultation note from 05/03/16 for details of initial presentation.    Interval hx:   No significant changes overnight. Patient continues to diurese well. No complaints of joint pain today other than right foot which feels unchanged from yesterday and very mild. No impairment with weight-bearing and ambulates freely.    Inpatient Medications:  Current Facility-Administered Medications   Medication Dose Route Frequency Provider Last Rate Last Dose    acetaminophen (TYLENOL) tablet 650 mg  650 mg Oral Q6H PRN Larna Daughters., MD   650 mg at 05/09/16 1204    azelastine (ASTELIN) 0.1 % nasal spray 1 spray  1 spray Each [place]aris BID Marcy Siren, MD   1 spray at 05/09/16 7026    colchicine tablet 0.6 mg  0.6 mg Oral Daily Marcy Siren, MD   0.6 mg at 05/09/16 3785    docusate sodium (COLACE) capsule 250 mg  250 mg Oral Daily Larna Daughters., MD        epoprostenol [facility]) 45,000 ng/mL in glycine diluent 100 mL infusion  29 ng/kg/min (Order-Specific) IntraVENOUS Continuous Papamatheakis, Pearson Forster, MD 3.33 mL/hr at 05/09/16 0845 29 ng/kg/min at 05/09/16 0845    febuxostat (ULORIC) tablet 40 mg  40 mg Oral Daily Marcy Siren, MD   40 mg at 05/09/16 8850    ferrous sulfate EC tablet 324 mg  324 mg Oral BID  Larna Daughters., MD   324 mg at 27/74/12 8786    folic acid (FOLVITE) tablet 0.5 mg  0.5 mg Oral Daily Marcy Siren, MD   0.5 mg at 05/09/16 7672    furosemide (LASIX) injection 60 mg  60 mg IntraVENOUS TID Larna Daughters., MD   60 mg at 05/09/16 0902    Prostacyclin Cassette Change   Other Daily Papamatheakis, Pearson Forster, MD        Prostacyclin Ice Pack   Other Q6H Papamatheakis, Pearson Forster, MD   2 each at 05/09/16 0600    Prostacyclin Tubing   Other Once per day on Mon Wed Fri Papamatheakis, Pearson Forster, MD        Prostacylin Batteries   Other Once per day on Mon Papamatheakis, Rison, MD        sildenafil (REVATIO, VIAGRA) tablet 80 mg  80 mg Oral TID Marcy Siren, MD   80 mg at 05/09/16 0854    sodium chloride (PF) 0.9 % flush 3 mL  3 mL IntraVENOUS Q8H Marcy Siren, MD   3 mL at 05/09/16 0531    sodium chloride (PF) 0.9 % flush 3 mL  3 mL IntraVENOUS PRN Marcy Siren, MD   3 mL at 05/06/16 2053    sodium chloride 0.9 % TKO infusion   IntraVENOUS Continuous PRN Light, Clance Boll, MD  spironolactone (ALDACTONE) tablet 50 mg  50 mg Oral Daily Marcy Siren, MD   50 mg at 05/09/16 4259     Allergies:  Allergies   Allergen Reactions    Allopurinol Hives    Levaquin Swelling and Other     Severe joint pain      Ofloxacin Nausea and Vomiting    Chlorhexidine Itching    Sulfa Drugs Unspecified       Physical Exam:  Temperature:  [97.8 F (36.6 C)-98.8 F (37.1 C)] 98 F (36.7 C) (04/27 1145)  Blood pressure (BP): (131-141)/(59-80) 135/80 (04/27 1145)  Heart Rate:  [86-93] 92 (04/27 1145)  Respirations:  [20-22] 20 (04/27 1145)  Pain Score: 3 (04/27 1204)  O2 Device: Nasal cannula (04/27 0840)  O2 Flow Rate (L/min):  [3 l/min-4 l/min] 3 l/min (04/27 0840)  SpO2:  [94 %-98 %] 96 % (04/27 1145)    Gen: chronically ill-appearing WF appears stated age in NAD  HEENT: dry mucus membranes without ulcerations or trauma. No  scleral icterus or injection. Subtle left-sided alopecia appreciated.  Extrem: 2+ edema bilateral LE to abdomen. +digital clubbing in all fingers. No cyanosis.  CV: audible s1/s2 with RRR, 2/6 systolic murmur LLSB  Pulm: diffuse fine inspiratory crackles in all fields, no retractions/tachypnea/distress  Skin: diffuse blanching macular erythema over face, arms, legs, moderately worsened since 05/03/16 but stable from yesterday. Scattered peri-cuticular scabs. No sclerodactyly or telangectasias.    Neuro: alert and appropriate in conversation, MAEW, no focal deficits    MSK:   No muscle tenderness, atrophy, or abnormal movements noted  Hands: no swelling or tenderness of MCPs, PIPs, or DIPs  Right foot: diffuse soft tissue edema with mild TTP over dorsal midfoot. No warmth, erythema or abnormal masses appreciated.    Pertinent Labs:  CBC  Lab Results   Component Value Date    WBC 4.2 05/08/2016    WBC 4.1 05/07/2016    HGB 10.5 (L) 05/08/2016    HGB 10.9 (L) 05/07/2016    HCT 34.2 05/08/2016    HCT 35.7 05/07/2016    PLT 102 (L) 05/08/2016    PLT 111 (L) 05/07/2016    PLT 179 09/12/2008    PLT 162 09/09/2008    BAND 7 05/08/2016    BAND 1 05/02/2016    SEG 64 05/08/2016    SEG 61 05/07/2016    LYMPHS 20 05/08/2016    LYMPHS 28 05/07/2016    MONOS 4 05/08/2016    MONOS 7 05/07/2016      Chemistry  Lab Results   Component Value Date    NA 138 05/08/2016    NA 137 05/07/2016    K 3.8 05/08/2016    K 3.5 05/07/2016    CL 102 05/08/2016    CL 98 05/07/2016    BICARB 27 05/08/2016    BICARB 27 05/07/2016    BUN 52 (H) 05/08/2016    BUN 55 (H) 05/07/2016    CREAT 1.11 (H) 05/08/2016    CREAT 1.17 (H) 05/07/2016    GLU 109 (H) 05/08/2016    GLU 126 (H) 05/07/2016    [place] 9.6 05/08/2016     9.6 05/07/2016    MG 2.2 05/08/2016    MG 2.2 05/07/2016    PHOS 2.9 05/02/2016    PHOS 2.9 12/21/2014     Lab Results   Component Value Date    ALK 169 (H) 05/02/2016    ALK 112 12/12/2015    AST 17  05/02/2016    AST 28 12/12/2015    ALT 16  05/02/2016    ALT 21 12/12/2015    TBILI 0.60 05/02/2016    TBILI 0.64 12/12/2015    DBILI 0.1 06/06/2011    DBILI 0.1 07/12/2009    ALB 3.8 05/02/2016    ALB 4.0 12/12/2015          Coags  Lab Results   Component Value Date    PT 17.9 (H) 05/08/2016    PT 17.5 (H) 05/07/2016    PTT 36.6 (H) 12/21/2014    PTT 39.1 (H) 02/20/2013    INR 1.7 05/08/2016    INR 1.6 05/07/2016     Lab Results   Component Value Date    ESR 36 (H) 05/02/2016    ESR 60 (H) 04/16/2011    ESR 37 (H) 06/26/2010     Results for TINIA, ORAVEC (MRN [id number]) as of 05/03/2016 17:41   Ref. Range 06/26/2010 16:53 04/16/2011 15:30 08/11/2012 16:10 05/02/2016 20:36   Anti-Nuclear Ab Titer Latest Ref Range: <1:40 titer >1:640      Anti-DSDNA Latest Ref Range: 0 - 24 IU >300 (H) >200 (H) >200 (H)    C3 Latest Ref Range: 90 - 180 mg/dL 133 133 118 119   C4 Latest Ref Range: 10 - 40 mg/dL 19 19 20 18    SSA Ab Latest Ref Range: Negative  Negative      SSB Ab Latest Ref Range: Negative  Negative        Results for DANISA, KOPEC (MRN [id number]) as of 05/07/2016 12:51   Ref. Range 05/04/2016 14:45   Cardiolipin Ab, IgA Latest Ref Range: 0 - 11 APL 0   Cardiolipin IgM, Ab Latest Ref Range: 0 - 12 MPL 0   Results for KYNISHA, MEMON (MRN [id number]) as of 05/07/2016 12:51   Ref. Range 05/04/2016 14:45   Beta-2 Glycoprotein 1 Ab, IGG Latest Ref Range: 0 - 20 SGU 21 (H)   Results for NIZA, SODERHOLM (MRN [id number]) as of 05/07/2016 12:51   Ref. Range 05/04/2016 14:45   Beta-2 Glycoprotein 1 Ab, IGM Latest Ref Range: 0 - 20 SMU 5       Results for MAKENZYE, TROUTMAN (MRN [id number]) as of 05/06/2016 13:24   Ref. Range 05/04/2016 14:45   RNP (Ribonucloprotein Ab) Latest Ref Range: 0.0 - 4.9 U/mL 2.4   Smith Ab Latest Ref Range: 0.0 - 6.9 U/mL 3.6   SSA Ab Latest Ref Range: 0.0 - 6.9 U/mL >240.0 (H)   SSB Ab Latest Ref Range: 0.0 - 6.9 U/mL 58.0 (H)       Results for TAISA, DELORIA (MRN [id number]) as of 05/03/2016 17:41   Ref. Range 05/02/2016 22:55      Protein Latest Ref Range: Negative  1+ (A)   Glucose Latest Ref Range: Negative  Negative   Ketones Latest Ref Range: Negative  Negative   Leuk Esterase Latest Ref Range: Negative  Negative   Nitrite Latest Ref Range: Negative  Negative   Bilirubin Latest Ref Range: Negative  Negative   Blood Latest Ref Range: Negative  1+ (A)   Urobilinogen Latest Ref Range: Negative  Negative   WBC Latest Ref Range: 0-2/HPF  0-2   RBC Latest Ref Range: 0-2/HPF  0-2     Results for ANNASTYN, SILVEY (MRN [id number]) as of 05/07/2016 12:51   Ref. Range 05/06/2016 05:41   Uric Acid Latest Ref Range: 2.4 - 5.7 mg/dL 11.8 (  H)       Pertinent Imaging:    CT chest 06/24/2010:  IMPRESSION:     Redemonstrated evidence of pulmonary hypertension. Also relatively unchanged   but extensive bilateral lung abnormalities, some of which may be seen with   pulmonary hypertension. However, follicular bronchiolitis / lymphocytic   interstitial pneumonitis is in the differential. If the pulmonary   hypertension is unexplained, then pulmonary veno-occlusive disease / capillary  hemangiomatosis may be considered.    CT chest wo contrast 05/04/16:  IMPRESSION:  1. Severe enlargement of the pulmonary artery with increased calcification along the pulmonary arterial walls compatible with longstanding pulmonary hypertension. Progressed parenchymal changes of the lungs compatible with pulmonary hypertension .    2. Increased thin wall l cystic esions seen within the lungs, nonspecific be can be seen with underlying lymphocytic interstitial pneumonia, which can be seen in patients with lupus/Sjogren's disease.    3. Cardiomegaly with evidence of right heart dysfunction. Trace nonspecific pericardial fluid with pericardial thickening suspected. In a patient with known lupus underlying pericarditis/serositis may be present. Correlation with echocardiogram is recommended.    4. Likely reactive adenopathy of the thorax.    5. Additional findings as detailed  above.      [place] 02/2016:        Assessment:  Diana Chambers is a pleasant 66 year old caucasian female with a history of SLE (+ANA/dsDNA, minimal prior symptoms, never on treatment) as well as pulmonary HTN (thought to be related to prior anorexigen use vs underlying CTD) with chronic hypoxia on home O2 and CKD currently admitted to the pulmonary service for hypoxemic respiratory failure and volume overload. Rheumatology has been consulted regarding possible contribution of SLE to her current presentation.    She has a history of SLE based on strongly-positive ANA and dsDNA but her history and exam do not reveal significant evidence of disease activity. Anti-phospholipid studies negative thus far. No evidence of lupus nephritis based on her UA. 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 Serologies to evaluate for scleroderma, dermatomyositis and UCTD/MCTD are negative. PFTs appear stable from prior studies. There is no indication for empiric steroids at this time, though if she were to worsen significantly then it may be indicated. Choice of definitive therapy (MMF vs AZA) can be safely deferred to the outpatient setting.    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 Colchicine may be helpful for prevention. Her flares have responded well to steroids in the past. Her uric acid is markedly elevated at 11.8. Ideally we would continue prophylactic colchicine and wait until her diuresis is complete prior to starting urate-lowering therapy (which may precipitate a flare), but [place] and we don't want her to flare at home. We have therefore started  febuxostat while she is here in the hospital for more diuresis so that if she flares we can respond to it quickly with prednisone.    It is also worth considering that her prior arthropathies which have been attributed to gout could also be related to her underlying SLE/CTD. This would be expected to respond to steroid therapy but she has some subtle evidence of  other peripheral SLE manifestations (such as pericardial changes on imaging, alopecia noted on exam) in addition to her parenchymal lung abnormalities.    Recommendations:  - Continue febuxostat 40 mg daily. Previously discussed with patient the high probability of flare in the days after starting this, and she elects to proceed.   - Also counseled on potential CV mortality risks with febuxostat (though these are primarily CAD-related, not significantly associated with right-heart failure as in her case).  - Will treat potential gout flare with prednisone as indicated but will assess her if this occurs.  - Would continue colchicine 0.6 mg daily for gout flare prophylaxis  - Will await finalization of [facility] myositis panel  - We have made arrangements for her to follow-up with our [facility] arthritis clinic within 4-8 weeks of discharge.    Thank you for allowing [place] to participate in the care of this patient.  Please do not hesitate to call with any questions.  Patient discussed with attending rheumatologist Dr. Candiss Norse who concurs with the above.    Keturah Shavers MD  Rheumatology Fellow

## 2016-05-10 LAB — COMPREHENSIVE METABOLIC PANEL, BLOOD
ALT (SGPT): 22 U/L (ref 0–33)
AST (SGOT): 25 U/L (ref 0–32)
Albumin: 3.1 g/dL — ABNORMAL LOW (ref 3.5–5.2)
Alkaline Phos: 162 U/L — ABNORMAL HIGH (ref 35–140)
Anion Gap: 14 mmol/L (ref 7–15)
BUN: 48 mg/dL — ABNORMAL HIGH (ref 8–23)
Bicarbonate: 26 mmol/L (ref 22–29)
Bilirubin, Tot: 0.31 mg/dL (ref <1.2)
Calcium: 9.7 mg/dL (ref 8.5–10.6)
Chloride: 99 mmol/L (ref 98–107)
Creatinine: 1.13 mg/dL — ABNORMAL HIGH (ref 0.51–0.95)
GFR: 48 mL/min
Glucose: 91 mg/dL (ref 70–99)
Potassium: 3.5 mmol/L (ref 3.5–5.1)
Sodium: 139 mmol/L (ref 136–145)
Total Protein: 7.2 g/dL (ref 6.0–8.0)

## 2016-05-10 LAB — PRO BNP, BLOOD: BNPP: 6086 pg/mL — ABNORMAL HIGH (ref 0–899)

## 2016-05-10 MED ORDER — FERROUS SULFATE 324 (65 FE) MG PO TBEC
324.0000 mg | DELAYED_RELEASE_TABLET | Freq: Every day | ORAL | Status: DC
Start: 2016-05-11 — End: 2016-05-16
  Administered 2016-05-11 – 2016-05-16 (×6): 324 mg via ORAL
  Filled 2016-05-10 (×6): qty 1

## 2016-05-10 MED ORDER — POTASSIUM CHLORIDE CRYS CR 10 MEQ OR TBCR
40.0000 meq | EXTENDED_RELEASE_TABLET | Freq: Once | ORAL | Status: AC
Start: 2016-05-10 — End: 2016-05-10
  Administered 2016-05-10: 40 meq via ORAL
  Filled 2016-05-10: qty 4

## 2016-05-10 NOTE — Plan of Care (Signed)
Problem: Tissue Perfusion, Cardiopulmonary - Altered  Goal: Hemodynamic stability  Outcome: Progressing toward goal, anticipate improvement over: next 12-24 hours  Temperature:  [97.1 F (36.2 C)-98.1 F (36.7 C)] 97.1 F (36.2 C) (04/28 1154)  Blood pressure (BP): (125-135)/(55-81) 135/81 (04/28 1154)  Heart Rate:  [90-100] 93 (04/28 1154)  Respirations:  [16-20] 18 (04/28 1154)  Pain Score: 0 (04/28 1154)  O2 Device: Nasal cannula (04/28 1154)  O2 Flow Rate (L/min):  [3 l/min] 3 l/min (04/28 1154)  SpO2:  [93 %-96 %] 94 % (04/28 1154)   Assessed and monitored cardiac status q4h and prn, instructed patient to report chest pain/palpitations/pressure. Telemetry monitored, NSR with HR 93 and BP 135/81.  Na=139, K=3.5 this morning with labs. Peripheral pulses palpable with +2 BLE edema noted. Patient denies chest pain, pressure and palpitations. Will continue to monitor and assess for changes.

## 2016-05-10 NOTE — Progress Notes (Signed)
PULMONARY/CRITICAL CARE ATTENDING NOTE  Current Hospital 7 Days 23 Hours    Subjective:   Breathing improved. LE edema improved.    Medications:  Scheduled Meds   azelastine  1 spray BID    colchicine  0.6 mg Daily    docusate sodium  250 mg Daily    febuxostat  40 mg Daily    ferrous sulfate  916 mg BID    folic acid  0.5 mg Daily    furosemide  60 mg TID    Prostacyclin Cassette Change   Daily    Prostacyclin Ice Pack   Q6H    Prostacyclin Tubing   Once per day on Mon Wed Fri    Prostacylin Batteries   Once per day on Mon    sildenafil  80 mg TID    sodium chloride (PF)  3 mL Q8H    spironolactone  50 mg Daily     PRN Meds   acetaminophen  650 mg Q6H PRN    sodium chloride (PF)  3 mL PRN    sodium chloride   Continuous PRN     IV Meds   epoprostenol (FLOLAN) infusion 29 ng/kg/min (05/09/16 1927)    sodium chloride         Vitals:    Latest Entry  Range (last 24 hours)    Temperature: 97.5 F (36.4 C)  Temp  Avg: 97.9 F (36.6 C)  Min: 97.5 F (36.4 C)  Max: 98.2 F (36.8 C)    Blood pressure (BP): 125/72  BP  Min: 125/72  Max: 137/62    Heart Rate: 92  Pulse  Avg: 93  Min: 89  Max: 100    Respirations: 16  Resp  Avg: 19.1  Min: 16  Max: 20    SpO2: 96 %  SpO2  Avg: 95.6 %  Min: 93 %  Max: 96 %       No Data Recorded     Weight: 75.4 kg (166 lb 3.6 oz)  Percentage Weight Change (%): 1.48 %    Intake/Output Summary (Last 24 hours) at 05/10/16 1122  Last data filed at 05/10/16 1101   Gross per 24 hour   Intake             1953 ml   Output             3151 ml   Net            -1198 ml     Exam:   Gen: NAD, comfortable in bed  NECK: + JVD  CV: RRR, + TR murmur no S3  RESP: Rales base lung fields bilat  ABD: moderate distention, non tender  EXT: Trace edema  NEURO: non-focal, grossly intact    Labs:   CBC  Recent Labs      05/08/16   0627   WBC  4.2   HGB  10.5*   HCT  34.2   PLT  102*   BAND  7   SEG  64   LYMPHS  20   MONOS  4      Chemistry  Recent Labs      05/08/16   0627   NA  138   K  3.8      CL  102   BICARB  27   BUN  52*   CREAT  1.11*   GLU  109*   [place]  9.6   MG  2.2  Coags  Recent Labs      05/08/16   0627   PT  17.9*   INR  1.7     ASSESSMENT/PLAN:  66 yo F with 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 Trigger for decompensation unclear - may be related to concurrent viral infection. Symptoms have improved with diuresis but she remains with  trace edema some ascites and  BNPP > 6000     -Continue with IV diuretics until euvolemic  -Once euvolemic will transition to oral diuretic regimen  -Continue IV epo @ 29 ng and sildenafil 80 mg TID  -Check TSH and FT4  -Check repeat ECHO  -Appreciate rheum input - no role for immunosuppression at this time.  -Continue colchicine and uloric for gout  -Continue supplemental iron for anemia   -CT scan and PFT's with minimal change/deterioration over 5+ years.  No role for medical intervention at this time.  -d/c warfarin therapy.There is limited evidence to support the use of Vit K A therapy in [place] related to CTD.    # Code Status:  Full Code    [name] M.D.  Pulmonary and Critical Care Attending  Pager [phone]

## 2016-05-10 NOTE — Plan of Care (Signed)
Problem: Breathing Pattern - Ineffective  Goal: Arterial blood gas values and/or saturation return to baseline  Outcome: Progressing toward goal, anticipate improvement over: next 12-24 hours  Pt no on 3L nc and satting wdl, no signs of distress. Pt denies sob or difficulty breathing. Will continue to monitor and ween O2 as tolerated to baseline 2L.    Problem: Tissue Perfusion, Cardiopulmonary - Altered  Goal: Hemodynamic stability  Outcome: Progressing toward goal, anticipate improvement over: next 12-24 hours  Vitals monitored q4h as ordered, all vss on 3L nc, see f/s for details. Daily weights show weight increases, MD notified. On flolan and IV diuretics, see MAR for details. Monitoring daily labs.    Problem: Falls, Risk of  Goal: Keep patient free from falls utilizing universal fall precautions  Outcome: Goal Met  the patient bed set to lowest position with breaks applied. Call light placed in hand. Assessed pt to be ambulatory independent in the room with husband at bedside. O2 tubing long enough for pt to reach bathroom. Non-skid socks noted on both feet. Will continue to monitor.

## 2016-05-11 LAB — COMPREHENSIVE METABOLIC PANEL, BLOOD
ALT (SGPT): 23 U/L (ref 0–33)
AST (SGOT): 24 U/L (ref 0–32)
Albumin: 3.1 g/dL — ABNORMAL LOW (ref 3.5–5.2)
Alkaline Phos: 159 U/L — ABNORMAL HIGH (ref 35–140)
Anion Gap: 8 mmol/L (ref 7–15)
BUN: 44 mg/dL — ABNORMAL HIGH (ref 8–23)
Bicarbonate: 29 mmol/L (ref 22–29)
Bilirubin, Tot: 0.38 mg/dL (ref <1.2)
Calcium: 9.8 mg/dL (ref 8.5–10.6)
Chloride: 101 mmol/L (ref 98–107)
Creatinine: 1.13 mg/dL — ABNORMAL HIGH (ref 0.51–0.95)
GFR: 48 mL/min
Glucose: 96 mg/dL (ref 70–99)
Potassium: 3.6 mmol/L (ref 3.5–5.1)
Sodium: 138 mmol/L (ref 136–145)
Total Protein: 7.1 g/dL (ref 6.0–8.0)

## 2016-05-11 LAB — TSH, BLOOD: TSH: 3.65 u[IU]/mL (ref 0.27–4.20)

## 2016-05-11 LAB — FREE THYROXINE, BLOOD: Free T4: 1.15 ng/dL (ref 0.93–1.70)

## 2016-05-11 MED ORDER — FUROSEMIDE 10 MG/ML IJ SOLN
80.0000 mg | Freq: Three times a day (TID) | INTRAMUSCULAR | Status: DC
Start: 2016-05-11 — End: 2016-05-12
  Administered 2016-05-11 – 2016-05-12 (×3): 80 mg via INTRAVENOUS
  Filled 2016-05-11 (×3): qty 8

## 2016-05-11 MED ORDER — POTASSIUM CHLORIDE CRYS CR 10 MEQ OR TBCR
40.0000 meq | EXTENDED_RELEASE_TABLET | Freq: Every day | ORAL | Status: DC
Start: 2016-05-11 — End: 2016-05-16
  Administered 2016-05-11 – 2016-05-16 (×6): 40 meq via ORAL
  Filled 2016-05-11 (×7): qty 4

## 2016-05-11 NOTE — Plan of Care (Signed)
Problem: Tissue Perfusion, Cardiopulmonary - Altered  Goal: Hemodynamic stability  Outcome: Progressing toward goal, anticipate improvement over: next 12-24 hours  Temperature:  [97 F (36.1 C)-98.4 F (36.9 C)] 97.3 F (36.3 C) (04/29 1118)  Blood pressure (BP): (135-150)/(69-81) 149/81 (04/29 1118)  Heart Rate:  [91-107] 91 (04/29 1240)  Respirations:  [17-20] 17 (04/29 1240)  Pain Score: 0 (04/29 1118)  O2 Device: Nasal cannula (04/29 1240)  O2 Flow Rate (L/min):  [3 l/min] 3 l/min (04/29 1240)  SpO2:  [92 %-95 %] 95 % (04/29 1240)   Assessed and monitored cardiac status q4h and prn, instructed patient to report chest pain/palpitations/pressure. Telemetry monitored, NSR with HR 91 and BP 149/81.  Na=138, K=3.6 this morning with labs. Peripheral pulses palpable with +2 BLE edema noted. Patient denies chest pain, pressure and palpitations. Will continue to monitor and assess for changes.

## 2016-05-11 NOTE — Plan of Care (Signed)
Problem: Fluid Volume Excess  Goal: Absence of edema; peripheral and/or pulmonary  Outcome: Progressing toward goal, anticipate improvement over: >48 hours  Patient is currently on 2000 ml fluid restriction per day per MD orders. Pt is on scheduled Lasix  60 mg IV 3 times daily. Please refer to [facility] for details. Will continue to monitor weights daily and record I&Os. Will continue plan of care.

## 2016-05-11 NOTE — Plan of Care (Signed)
Problem: Breathing Pattern - Ineffective  Goal: Arterial blood gas values and/or saturation return to baseline  Outcome: Goal Met  Assess lung sounds q shift and prn. Monitor and record O2 sats. Instruct pt to notify RN with c/o SOB or difficulty breathing. Encourage pt deep breathing and coughing exercises. RT manages pt's breathing treatments. Pt is on 3 LPM/NC with O2 saturation within normal range. Will continue to monitor and will document any changes. Will continue plan of care.    Goal: Respiratory rate, rhythm and depth return to baseline  Outcome: Goal Met    Goal: Minimal or absent use of accessory muscles  Outcome: Goal Met      Problem: Tissue Perfusion, Cardiopulmonary - Altered  Goal: Hemodynamic stability  Outcome: Goal Met  Assess heart sounds, lung sounds, and for presence of edema. Monitor for CP/pressure and instruct pt to call if CP/pressure occur. Tele monitoring. Monitor and record vital signs, O2 sats, I&O's, labs and weights as ordered and report abnormal results to MD.  Pt denies chest pain/pressure/palp. Pt is in sinus rhythm- sinus tach with HR in the 90's-120's. Pt's vital signs are monitored and documented per MD's order. Pt is on Flolan set at 45,000 ng/ml, 29 ng/kg/min, 80 ml/24 hrs. Will continue to monitor and will document any changes. Will continue plan of care.      Problem: Falls, Risk of  Goal: Keep patient free from falls utilizing universal fall precautions  Outcome: Goal Met  2 Siderails up at all times when pt in bed. Call light within reach. Bed in low and locked position. Pt oriented to room and instructed on call light use. Instruct pt to call for assistance. Pt without injury this shift. Pt using call light appropriately for assistance. Pt's gait is steady and balance. Pt ambulates with her husband around the hallway. Will continue to monitor and will document any changes. Will continue plan of care.

## 2016-05-11 NOTE — Progress Notes (Signed)
[facility] NOTE  Current Hospital 8 Days 22 Hours      Medications:  Scheduled Meds   azelastine  1 spray BID    colchicine  0.6 mg Daily    docusate sodium  250 mg Daily    febuxostat  40 mg Daily    ferrous sulfate  948 mg Daily    folic acid  0.5 mg Daily    furosemide  60 mg TID    Prostacyclin Cassette Change   Daily    Prostacyclin Ice Pack   Q6H    Prostacyclin Tubing   Once per day on Mon Wed Fri    Prostacylin Batteries   Once per day on Mon    sildenafil  80 mg TID    sodium chloride (PF)  3 mL Q8H    spironolactone  50 mg Daily     PRN Meds   acetaminophen  650 mg Q6H PRN    sodium chloride (PF)  3 mL PRN    sodium chloride   Continuous PRN     IV Meds   epoprostenol (FLOLAN) infusion 29 ng/kg/min (05/10/16 1341)    sodium chloride         Vitals:    Latest Entry  Range (last 24 hours)    Temperature: 97.7 F (36.5 C)  Temp  Avg: 97.6 F (36.4 C)  Min: 97 F (36.1 C)  Max: 98.4 F (36.9 C)    Blood pressure (BP): 150/81  BP  Min: 135/74  Max: 150/81    Heart Rate: 96  Pulse  Avg: 96.1  Min: 93  Max: 107    Respirations: 20  Resp  Avg: 19.1  Min: 18  Max: 20    SpO2: 95 %  SpO2  Avg: 93.6 %  Min: 92 %  Max: 95 %       No Data Recorded     Weight: 75.8 kg (167 lb 1.7 oz)  Percentage Weight Change (%): 0.53 %       Intake/Output Summary (Last 24 hours) at 05/11/16 1035  Last data filed at 05/11/16 0825   Gross per 24 hour   Intake              786 ml   Output             3501 ml   Net            -2715 ml     Exam:  Gen: NAD, comfortable in bed  NECK: + JVD  CV: RRR, + TR murmur no S3  RESP: Rales base lung fields bilat  ABD: moderate distention, non tender  EXT: Trace edema  NEURO: non-focal, grossly intact     Labs:        Chemistry  Recent Labs      05/10/16   0543  05/11/16   0543   NA  139  138   K  3.5  3.6   CL  99  101   BICARB  26  29   BUN  48*  44*   CREAT  1.13*  1.13*   GLU  91  96   [place]  9.7  9.8     Recent Labs      05/10/16   0543  05/11/16   0543     ALK  162*  159*   AST  25  24   ALT  22  23   TBILI  0.31  0.38   ALB  3.1*  3.1*          ASSESSMENT/PLAN:  66 yo F with 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 Trigger for decompensation unclear - may be related to concurrent viral infection.  Persistent edema and weight increasing despite diuresis.    -Continue with IV diuretics until euvolemic - increase to 80 mg TID  -Once euvolemic will transition to oral diuretic regimen  -Continue IV epo @ 29 ng and sildenafil 80 mg TID  -TSH 3.4  -Check repeat ECHO tomorrow  -Appreciate rheum input - no role for immunosuppression at this time.  -Continue colchicine and uloric for gout  -Continue supplemental iron for anemia   -CT scan and PFT's with minimal change/deterioration over 5+ years. No role for medical intervention at this time.  -d/c warfarin therapy.There is limited evidence to support the use of Vit K A therapy in [facility] relatedto CTD.    # Code Status:  Full Code    [name] M.D.  Pulmonary and Critical Care Attending  Pager [phone]

## 2016-05-12 DIAGNOSIS — I517 Cardiomegaly: Secondary | ICD-10-CM

## 2016-05-12 DIAGNOSIS — I071 Rheumatic tricuspid insufficiency: Secondary | ICD-10-CM

## 2016-05-12 LAB — CBC WITH DIFF, BLOOD
ANC-Manual Mode: 2.5 10*3/uL (ref 1.6–7.0)
Abs Basophils: 0.1 10*3/uL (ref <0.1)
Abs Lymphs: 1.1 10*3/uL (ref 0.8–3.1)
Abs Monos: 0.2 10*3/uL (ref 0.2–0.8)
Basophils: 2 %
Eosinophils: 1 %
Hct: 34.7 % (ref 34.0–45.0)
Hgb: 10.5 gm/dL — ABNORMAL LOW (ref 11.2–15.7)
Lymphocytes: 27 %
MCH: 24.2 pg — ABNORMAL LOW (ref 26.0–32.0)
MCHC: 30.3 g/dL — ABNORMAL LOW (ref 32.0–36.0)
MCV: 80.1 um3 (ref 79.0–95.0)
MPV: 9.7 fL (ref 9.4–12.4)
Monocytes: 4 %
Plt Count: 91 10*3/uL — ABNORMAL LOW (ref 140–370)
RBC: 4.33 10*6/uL (ref 3.90–5.20)
RDW: 16.8 % — ABNORMAL HIGH (ref 12.0–14.0)
Segs: 64 %
WBC: 3.9 10*3/uL — ABNORMAL LOW (ref 4.0–10.0)

## 2016-05-12 LAB — 2D ECHO WITH IMAGE ENHANCEMENT AGENT IF NECESSARY
AO Mean Gradient: 9 mmHg
Aortic Valve Area: 2.42 cm2
IVC Diameter: 1.9 cm
LA Volume Index: 27.8 ml/m²
LV Ejection Fraction: 63 %
PA Pressure: 70 mmHg

## 2016-05-12 LAB — MDIFF
Bands: 1 % (ref 0–15)
Myelocytes: 1 %
Number of Cells Counted: 113
Plt Est: DECREASED

## 2016-05-12 LAB — COMPREHENSIVE METABOLIC PANEL, BLOOD
ALT (SGPT): 26 U/L (ref 0–33)
AST (SGOT): 28 U/L (ref 0–32)
Albumin: 3.2 g/dL — ABNORMAL LOW (ref 3.5–5.2)
Alkaline Phos: 175 U/L — ABNORMAL HIGH (ref 35–140)
Anion Gap: 9 mmol/L (ref 7–15)
BUN: 42 mg/dL — ABNORMAL HIGH (ref 8–23)
Bicarbonate: 30 mmol/L — ABNORMAL HIGH (ref 22–29)
Bilirubin, Tot: 0.43 mg/dL (ref <1.2)
Calcium: 9.8 mg/dL (ref 8.5–10.6)
Chloride: 100 mmol/L (ref 98–107)
Creatinine: 1.18 mg/dL — ABNORMAL HIGH (ref 0.51–0.95)
GFR: 46 mL/min
Glucose: 90 mg/dL (ref 70–99)
Potassium: 3.8 mmol/L (ref 3.5–5.1)
Sodium: 139 mmol/L (ref 136–145)
Total Protein: 7.5 g/dL (ref 6.0–8.0)

## 2016-05-12 LAB — PRO BNP, BLOOD: BNPP: 7360 pg/mL — ABNORMAL HIGH (ref 0–899)

## 2016-05-12 MED ORDER — POTASSIUM CHLORIDE CRYS CR 10 MEQ OR TBCR
20.0000 meq | EXTENDED_RELEASE_TABLET | Freq: Once | ORAL | Status: AC
Start: 2016-05-12 — End: 2016-05-12
  Administered 2016-05-12: 20 meq via ORAL
  Filled 2016-05-12: qty 2

## 2016-05-12 MED ORDER — FUROSEMIDE 10 MG/ML IJ SOLN
80.0000 mg | Freq: Four times a day (QID) | INTRAMUSCULAR | Status: DC
Start: 2016-05-12 — End: 2016-05-15
  Administered 2016-05-12 – 2016-05-15 (×12): 80 mg via INTRAVENOUS
  Filled 2016-05-12 (×12): qty 8

## 2016-05-12 NOTE — Progress Notes (Signed)
PULMONARY/CRITICAL CARE ATTENDING NOTE  Current Hospital 10 Days 9 Hours     Subjective:   Improved SOB    Medications:  Scheduled Meds   azelastine  1 spray BID    colchicine  0.6 mg Daily    docusate sodium  250 mg Daily    febuxostat  40 mg Daily    ferrous sulfate  267 mg Daily    folic acid  0.5 mg Daily    furosemide  80 mg 4x Daily    potassium chloride  40 mEq Daily    Prostacyclin Cassette Change   Daily    Prostacyclin Ice Pack   Q6H    Prostacyclin Tubing   Once per day on Mon Wed Fri    Prostacylin Batteries   Once per day on Mon    sildenafil  80 mg TID    sodium chloride (PF)  3 mL Q8H    spironolactone  50 mg Daily     PRN Meds   acetaminophen  650 mg Q6H PRN    sodium chloride (PF)  3 mL PRN    sodium chloride   Continuous PRN     IV Meds   epoprostenol (FLOLAN) infusion 29 ng/kg/min (05/12/16 1443)    sodium chloride         Vitals:    Latest Entry  Range (last 24 hours)    Temperature: 98.2 F (36.8 C)  Temp  Avg: 97.8 F (36.6 C)  Min: 97.1 F (36.2 C)  Max: 98.5 F (36.9 C)    Blood pressure (BP): 138/69  BP  Min: 124/69  Max: 143/74    Heart Rate: 95  Pulse  Avg: 95.2  Min: 89  Max: 102    Respirations: 18  Resp  Avg: 19.1  Min: 18  Max: 22    SpO2: 94 %  SpO2  Avg: 94.6 %  Min: 93 %  Max: 97 %       No Data Recorded     Weight: 75.6 kg (166 lb 10.7 oz)  Percentage Weight Change (%): -0.26 %       Intake/Output Summary (Last 24 hours) at 05/12/16 2133  Last data filed at 05/12/16 2130   Gross per 24 hour   Intake             1263 ml   Output             4050 ml   Net            -2787 ml     Exam:  Gen: NAD, comfortable in bed  NECK: + JVD  CV: RRR, + TR murmur no S3  RESP: Rales baselung fields bilat  ABD: moderate distention, non tender  EXT: 1 + edema  NEURO: non-focal, grossly intact    Labs:   CBC  Recent Labs      05/12/16   0604   WBC  3.9*   HGB  10.5*   HCT  34.7   PLT  91*   BAND  1   SEG  64   LYMPHS  27   MONOS  4      Chemistry  Recent Labs      05/11/16   0543  05/12/16   0604   NA  138  139   K  3.6  3.8   CL  101  100   BICARB  29  30*   BUN  44*  42*  CREAT  1.13*  1.18*   GLU  96  90   [place]  9.8  9.8     Recent Labs      05/11/16   0543  05/12/16   0604   ALK  159*  175*   AST  24  28   ALT  23  26   TBILI  0.38  0.43   ALB  3.1*  3.2*        ASSESSMENT/PLAN:  66 yo F with 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 Trigger for decompensation unclear - may be related to concurrent viral infection.  Persistent edema and weight increasing despite diuresis.    -Continue with IV diuretics until euvolemic - increase to 80 mg QID  -ECHO from today with stable, severe PAH compared to last [facility] 11/2015  -Once euvolemic will transition to oral diuretic regimen  -Continue IV epo @ 29 ng and sildenafil 80 mg TID  -Appreciate rheum input - no role for immunosuppression at this time.  -Continue colchicine and uloric for gout  -Continue supplemental iron for anemia   -CT scan and PFT's with minimal change/deterioration over 5+ years. No role for medical intervention at this time.  -d/c warfarin.Do not need to resume as outpatient as there is limited evidence to support the use of Vit K A therapy in [facility] relatedto CTD.    # Code Status:  Full Code    [name] M.D.  Pulmonary and Critical Care Attending  Pager [phone]

## 2016-05-12 NOTE — Interdisciplinary (Signed)
05/12/16 1406   Follow Up/Progress   Is the Patient Ready for Discharge No   Barriers to Discharge Awaiting clinical improvement   Patient/Family/Legal/Surrogate Decision Maker Has Been Given Options And Choice In The Selection of Post-Acute Care Providers Yes   Plan/Interventions Explore needs and options for aftercare, provide referrals   Pt with PAH on home Flolan Gtt, admitted with acute on chronic right heart failure and volume overload. Remains on IV diuretic and Flolan Gtt. Anticipating dc home to [place] with her husband. Pt has a flolan pump and knows how to manage it at home.

## 2016-05-12 NOTE — Plan of Care (Signed)
Problem: Breathing Pattern - Ineffective  Goal: Arterial blood gas values and/or saturation return to baseline  Outcome: Goal Met  Assess lung sounds q shift and prn. Monitor and record O2 sats. Instruct pt to notify RN with c/o SOB or difficulty breathing. Encourage pt deep breathing and coughing exercises. RT manages pt's breathing treatments. Pt is on 3 LPM/NC with O2 saturation within normal range. Will continue to monitor and will document any changes. Will continue plan of care.    Goal: Respiratory rate, rhythm and depth return to baseline  Outcome: Goal Met      Problem: Tissue Perfusion, Cardiopulmonary - Altered  Goal: Hemodynamic stability  Outcome: Goal Met  Assess heart sounds, lung sounds, and for presence of edema. Monitor for CP/pressure and instruct pt to call if CP/pressure occur. Tele monitoring. Monitor and record vital signs, O2 sats, I&O's, labs and weights as ordered and report abnormal results to MD.  Pt denies chest pain/pressure/palp. Pt is in sinus rhythm- sinus tach with HR in the 90's-110's. Pt's vital signs are monitored and documented per MD's order. Pt is on Flolan set at 45,000 ng/ml, 29 ng/kg/min, 80 ml/24 hrs. Will continue to monitor and will document any changes. Will continue plan of care.      Problem: Falls, Risk of  Goal: Keep patient free from falls utilizing universal fall precautions  Outcome: Goal Met  2 Siderails up at all times when pt in bed. Call light within reach. Bed in low and locked position. Pt oriented to room and instructed on call light use. Instruct pt to call for assistance. Pt without injury this shift. Pt using call light appropriately for assistance. Pt's gait is steady and balance. Pt ambulates with her husband around the hallway. Will continue to monitor and will document any changes. Will continue plan of care.        Problem: Fluid Volume Excess  Goal: Absence of edema; peripheral and/or pulmonary  Outcome: Progressing toward goal, anticipate  improvement over: Next 24-48 hours  Patient is currently on 2000 ml fluid restriction per day per MD orders. Pt is on scheduled Lasix  80 mg IV 3 times daily. Please refer to [facility] for details. Will continue to monitor weights daily and record I&Os. Will continue plan of care.

## 2016-05-12 NOTE — Plan of Care (Signed)
Problem: Breathing Pattern - Ineffective  Goal: Respiratory rate, rhythm and depth return to baseline  Outcome: Goal Met      Problem: Tissue Perfusion, Cardiopulmonary - Altered  Goal: Hemodynamic stability  Outcome: Goal Met  Pt VSS are stable. Pt is afebrile, HR and BP are WNL for per pt baseline. Pt voiding adequately. Pt instructed to notify RN with any changes in the way that she feels (N/V, CP, Headache, SOB). Will continue to monitor and will notify MD/NP with any changes.      Problem: Falls, Risk of  Goal: Keep patient free from falls utilizing universal fall precautions  Outcome: Goal Met      Problem: Fluid Volume Excess  Goal: Absence of edema; peripheral and/or pulmonary  Outcome: Progressing toward goal, anticipate improvement over: >48 hours  Pt with +2 pitting edema to lower extremity.  IV lasix given per MD orders.  Strict I/0.  2000 ml fluid restrict.

## 2016-05-13 LAB — BASIC METABOLIC PANEL, BLOOD
Anion Gap: 10 mmol/L (ref 7–15)
BUN: 40 mg/dL — ABNORMAL HIGH (ref 8–23)
Bicarbonate: 31 mmol/L — ABNORMAL HIGH (ref 22–29)
Calcium: 9.9 mg/dL (ref 8.5–10.6)
Chloride: 100 mmol/L (ref 98–107)
Creatinine: 1.12 mg/dL — ABNORMAL HIGH (ref 0.51–0.95)
GFR: 49 mL/min
Glucose: 92 mg/dL (ref 70–99)
Potassium: 3.6 mmol/L (ref 3.5–5.1)
Sodium: 141 mmol/L (ref 136–145)

## 2016-05-13 LAB — MAGNESIUM, BLOOD: Magnesium: 2.1 mg/dL (ref 1.6–2.4)

## 2016-05-13 MED ORDER — CHLOROTHIAZIDE SODIUM 500 MG IV SOLR
250.0000 mg | Freq: Once | INTRAVENOUS | Status: AC
Start: 2016-05-13 — End: 2016-05-13
  Administered 2016-05-13: 250 mg via INTRAVENOUS
  Filled 2016-05-13: qty 9

## 2016-05-13 NOTE — Plan of Care (Signed)
Problem: Breathing Pattern - Ineffective  Goal: Respiratory rate, rhythm and depth return to baseline  Outcome: Progressing toward goal, anticipate improvement over: next 12-24 hours  Pt stable at this time. Comfortable on 4L NC O2. Able to amb around unit with husband and in room. Flolan infusing with no issues. Instructed pt to notify for any respiratory issues. Call light within reach. Rounding done.     Problem: Tissue Perfusion, Cardiopulmonary - Altered  Goal: Hemodynamic stability  Outcome: Progressing toward goal, anticipate improvement over: next 12-24 hours  Pt with no cardiac issues at this time. Pt on tele monitors. VSS. 4L NC O2. SR with 1 AVB. Will continue to monitor. Medications as ordered.     Problem: Falls, Risk of  Goal: Keep patient free from falls utilizing universal fall precautions  Outcome: Progressing toward goal, anticipate improvement over: next 12-24 hours  Pt needs being met at this time. Instructed to use call light for assistance. Husband at bedside. Rounding done. Bed in lowest locked position. Pt comfortable at this time. Will continue to monitor.     Problem: Fluid Volume Excess  Goal: Absence of edema; peripheral and/or pulmonary  Outcome: Progressing toward goal, anticipate improvement over: next 12-24 hours  Pt with fluid overload. Medications given as ordered. Strict I/Os. BLE edema. No sob. Pt instructed importance of 2015m/day fluid restrictions. No issues at this time. Will continue to monitor.

## 2016-05-13 NOTE — Progress Notes (Signed)
[facility] 11 Days 5 Hours  Subjective:   Breathing improved.      Medications:  Scheduled Meds   azelastine  1 spray BID    colchicine  0.6 mg Daily    docusate sodium  250 mg Daily    febuxostat  40 mg Daily    ferrous sulfate  660 mg Daily    folic acid  0.5 mg Daily    furosemide  80 mg 4x Daily    potassium chloride  40 mEq Daily    Prostacyclin Cassette Change   Daily    Prostacyclin Ice Pack   Q6H    Prostacyclin Tubing   Once per day on Mon Wed Fri    Prostacylin Batteries   Once per day on Mon    sildenafil  80 mg TID    sodium chloride (PF)  3 mL Q8H    spironolactone  50 mg Daily     PRN Meds   acetaminophen  650 mg Q6H PRN    sodium chloride (PF)  3 mL PRN    sodium chloride   Continuous PRN     IV Meds   epoprostenol (FLOLAN) infusion 29 ng/kg/min (05/13/16 1407)    sodium chloride         Vitals:    Latest Entry  Range (last 24 hours)    Temperature: 99 F (37.2 C)  Temp  Avg: 97.9 F (36.6 C)  Min: 97.2 F (36.2 C)  Max: 99 F (37.2 C)    Blood pressure (BP): 128/57  BP  Min: 126/64  Max: 142/67    Heart Rate: 96  Pulse  Avg: 93.7  Min: 89  Max: 97    Respirations: 20  Resp  Avg: 19  Min: 18  Max: 20    SpO2: 97 %  SpO2  Avg: 94.6 %  Min: 93 %  Max: 97 %       No Data Recorded     Weight: 74.9 kg (165 lb 2 oz)  Percentage Weight Change (%): -0.93 %    Vent:   No results found for: ARTPH, ARTPCO2, ARTPO2       Intake/Output Summary (Last 24 hours) at 05/13/16 1732  Last data filed at 05/13/16 1645   Gross per 24 hour   Intake           987.92 ml   Output             3250 ml   Net         -2262.08 ml     Exam:  Gen: NAD, comfortable in bed  NECK: + JVD  CV: RRR, + TR murmur no S3  RESP: Rales baselung fields bilat  ABD: moderate distention, non tender  EXT: 1 + edema  NEURO: non-focal, grossly intact    Labs:   CBC  Recent Labs      05/12/16   0604   WBC  3.9*   HGB  10.5*   HCT  34.7   PLT  91*   BAND  1   SEG  64   LYMPHS  27      MONOS  4      Chemistry  Recent Labs      05/12/16   0604  05/13/16   0820   NA  139  141   K  3.8  3.6   CL  100  100   BICARB  30*  31*   BUN  42*  40*   CREAT  1.18*  1.12*   GLU  90  92   [place]  9.8  9.9   MG   --   2.1     Recent Labs      05/11/16   0543  05/12/16   0604   ALK  159*  175*   AST  24  28   ALT  23  26   TBILI  0.38  0.43   ALB  3.1*  3.2*          ASSESSMENT/PLAN:  66 yo F with 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 Trigger for decompensation unclear - may be related to concurrent viral infection. Persistent edema.    -Continue with IV diuretics 80 mg QID  -Additional diuril dose today  -ECHO 05/12/16 with stable, severe PAH compared to last ECHO 11/2015  -Once euvolemic will transition to oral diuretic regimen  -Continue IV epo @ 29 ng and sildenafil 80 mg TID  -Consider trial of macitentan 10 mg PO QDAY.  Will need to enroll in [facility] in order to start  -Appreciate rheum input - no role for immunosuppression at this time.  -Continue colchicine and uloric for gout  -Continue supplemental iron for anemia   -CT scan and PFT's with minimal change/deterioration over 5+ years. No role for medical intervention at this time.  -d/c warfarin.Do not need to resume as outpatient as there is limited evidence to support the use of Vit K A therapy in [facility] relatedto CTD.    # Code Status:  Full Code    [name] M.D.  Pulmonary and Critical Care Attending  Pager [phone]

## 2016-05-13 NOTE — Plan of Care (Signed)
Problem: Breathing Pattern - Ineffective  Goal: Respiratory rate, rhythm and depth return to baseline  Outcome: Progressing toward goal, anticipate improvement over: >48 hours  Increased activity tolerance on 3 liters nc and plan is for 2 liters nc tomorrow (baseline)    Problem: Tissue Perfusion, Cardiopulmonary - Altered  Goal: Hemodynamic stability  Outcome: Progressing toward goal, anticipate improvement over: Next 24-48 hours  Continue with lasix  Maintaining stable vs     Problem: Falls, Risk of  Goal: Keep patient free from falls utilizing universal fall precautions  Outcome: Progressing toward goal, anticipate improvement over: next 12-24 hours  Continue to monitor for  fall risk pt is independent with ambulation and steady     Problem: Fluid Volume Excess  Goal: Absence of edema; peripheral and/or pulmonary    Intervention: Fluid overload signs and symptoms assessment  Pt with increased lasix dosing to QID  Pt tolerating well  Decreased LE edema   Maintains compliance with strict I & 0

## 2016-05-14 LAB — MAGNESIUM, BLOOD: Magnesium: 2.1 mg/dL (ref 1.6–2.4)

## 2016-05-14 LAB — BASIC METABOLIC PANEL, BLOOD
Anion Gap: 13 mmol/L (ref 7–15)
Anion Gap: 9 mmol/L (ref 7–15)
BUN: 44 mg/dL — ABNORMAL HIGH (ref 8–23)
BUN: 46 mg/dL — ABNORMAL HIGH (ref 8–23)
Bicarbonate: 28 mmol/L (ref 22–29)
Bicarbonate: 33 mmol/L — ABNORMAL HIGH (ref 22–29)
Calcium: 9.9 mg/dL (ref 8.5–10.6)
Calcium: 10.3 mg/dL (ref 8.5–10.6)
Chloride: 97 mmol/L — ABNORMAL LOW (ref 98–107)
Chloride: 97 mmol/L — ABNORMAL LOW (ref 98–107)
Creatinine: 1.17 mg/dL — ABNORMAL HIGH (ref 0.51–0.95)
Creatinine: 1.2 mg/dL — ABNORMAL HIGH (ref 0.51–0.95)
GFR: 46 mL/min
GFR: 45 mL/min
Glucose: 124 mg/dL — ABNORMAL HIGH (ref 70–99)
Glucose: 119 mg/dL — ABNORMAL HIGH (ref 70–99)
Potassium: 3 mmol/L — ABNORMAL LOW (ref 3.5–5.1)
Potassium: 4.1 mmol/L (ref 3.5–5.1)
Sodium: 138 mmol/L (ref 136–145)
Sodium: 139 mmol/L (ref 136–145)

## 2016-05-14 MED ORDER — CHLOROTHIAZIDE SODIUM 500 MG IV SOLR
250.0000 mg | Freq: Once | INTRAVENOUS | Status: AC
Start: 2016-05-14 — End: 2016-05-14
  Administered 2016-05-14: 250 mg via INTRAVENOUS
  Filled 2016-05-14: qty 9

## 2016-05-14 MED ORDER — POTASSIUM CHLORIDE CRYS CR 10 MEQ OR TBCR
40.0000 meq | EXTENDED_RELEASE_TABLET | ORAL | Status: AC
Start: 2016-05-14 — End: 2016-05-14
  Administered 2016-05-14 (×2): 40 meq via ORAL
  Filled 2016-05-14: qty 4

## 2016-05-14 NOTE — Plan of Care (Signed)
Problem: Breathing Pattern - Ineffective  Goal: Arterial blood gas values and/or saturation return to baseline  Outcome: Progressing toward goal, anticipate improvement over: >48 hours  Sat maintaining at 94% on 2 L NC. Cont to have SOB with exertion. Lungs B bases with crackles per auscultation. On IV lasix ATC. Strict I&O implemented  Goal: Respiratory rate, rhythm and depth return to baseline  Outcome: Progressing toward goal, anticipate improvement over: >48 hours    Goal: Minimal or absent use of accessory muscles  Outcome: Progressing toward goal, anticipate improvement over: >48 hours      Problem: Tissue Perfusion, Cardiopulmonary - Altered  Goal: Hemodynamic stability  Outcome: Progressing toward goal, anticipate improvement over: >48 hours      Problem: Falls, Risk of  Goal: Keep patient free from falls utilizing universal fall precautions  Outcome: Progressing toward goal, anticipate improvement over: >48 hours      Problem: Fluid Volume Excess  Goal: Absence of edema; peripheral and/or pulmonary  Outcome: Progressing toward goal, anticipate improvement over: >48 hours

## 2016-05-14 NOTE — Progress Notes (Signed)
PULMONARY/CRITICAL CARE ATTENDING NOTE  Current Hospital 12 Days 1 Hour    Subjective:   SOB improving.    Medications:  Scheduled Meds   azelastine  1 spray BID    colchicine  0.6 mg Daily    docusate sodium  250 mg Daily    febuxostat  40 mg Daily    ferrous sulfate  295 mg Daily    folic acid  0.5 mg Daily    furosemide  80 mg 4x Daily    potassium chloride  40 mEq Daily    Prostacyclin Cassette Change   Daily    Prostacyclin Ice Pack   Q6H    Prostacyclin Tubing   Once per day on Mon Wed Fri    Prostacylin Batteries   Once per day on Mon    sildenafil  80 mg TID    sodium chloride (PF)  3 mL Q8H    spironolactone  50 mg Daily     PRN Meds   acetaminophen  650 mg Q6H PRN    sodium chloride (PF)  3 mL PRN    sodium chloride   Continuous PRN     IV Meds   epoprostenol (FLOLAN) infusion 29 ng/kg/min (05/14/16 0800)    sodium chloride         Vitals:    Latest Entry  Range (last 24 hours)    Temperature: 98 F (36.7 C)  Temp  Avg: 98 F (36.7 C)  Min: 97.5 F (36.4 C)  Max: 99 F (37.2 C)    Blood pressure (BP): 117/64  BP  Min: 117/64  Max: 143/75    Heart Rate: 99  Pulse  Avg: 97.4  Min: 93  Max: 100    Respirations: 18  Resp  Avg: 19  Min: 18  Max: 20    SpO2: 94 %  SpO2  Avg: 95.3 %  Min: 93 %  Max: 97 %       No Data Recorded     Weight: 74.3 kg (163 lb 11.2 oz)  Percentage Weight Change (%): -0.86 %    Vent:   No results found for: ARTPH, ARTPCO2, ARTPO2       Intake/Output Summary (Last 24 hours) at 05/14/16 1329  Last data filed at 05/14/16 1200   Gross per 24 hour   Intake           201.47 ml   Output             4550 ml   Net         -4348.53 ml     Exam:  Gen: NAD, comfortable in bed  NECK: + JVD  CV: RRR, + TR murmur no S3  RESP: Rales baselung fields bilat  ABD: moderate distention, non tender  EXT: 1 + edema  NEURO: non-focal, grossly intact    Labs:   CBC  Recent Labs      05/12/16   0604   WBC  3.9*   HGB  10.5*   HCT  34.7   PLT  91*   BAND  1   SEG  64   LYMPHS  27   MONOS   4      Chemistry  Recent Labs      05/13/16   0820  05/14/16   0554   NA  141  138   K  3.6  3.0*   CL  100  97*   BICARB  31*  28  BUN  40*  44*   CREAT  1.12*  1.17*   GLU  92  124*   [place]  9.9  9.9   MG  2.1  2.1     Recent Labs      05/12/16   0604   ALK  175*   AST  28   ALT  26   TBILI  0.43   ALB  3.2*          ASSESSMENT/PLAN:  66 yo F with 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 Trigger for decompensation unclear - may be related to concurrent viral infection. Persistent edema.    -Continue with IV diuretics 80 mg QID  -Additional diuril dose today  -ECHO 05/12/16 with stable, severe PAH compared to last ECHO 11/2015  -Once euvolemic will transition to oral diuretic regimen  -Continue IV epo @ 29 ng and sildenafil 80 mg TID  -Consider trial of macitentan 10 mg PO QDAY.  Will need to enroll in [facility] in order to start.  Had extensive discussion re opsumit with patient and husband today.  -Appreciate rheum input - no role for immunosuppression at this time.  -Continue colchicine and uloric for gout  -Continue supplemental iron for anemia   -CT scan and PFT's with minimal change/deterioration over 5+ years. No role for medical intervention at this time.  -d/c warfarin.Do not need to resume as outpatient as there is limited evidence to support the use of Vit K A therapy in [facility] relatedto CTD.    # Code Status:  Full Code    [name] M.D.  Pulmonary and Critical Care Attending

## 2016-05-14 NOTE — Progress Notes (Signed)
Pulmonary Vascular Progress Note    Date of Admission: 05/02/2016    Interval/Subjective:  Diuresis ongoing.    Good response to acetazolamide.  LE edema is improving.  Patient reports being able to ambulate more easily and is back on baseline amt of supplemental oxygen.    Objective:  Current Facility-Administered Medications   Medication    acetaminophen (TYLENOL) tablet 650 mg    azelastine (ASTELIN) 0.1 % nasal spray 1 spray    colchicine tablet 0.6 mg    docusate sodium (COLACE) capsule 250 mg    epoprostenol (FLOLAN) 45,000 ng/mL in glycine diluent 100 mL infusion    febuxostat (ULORIC) tablet 40 mg    ferrous sulfate EC tablet 154 mg    folic acid (FOLVITE) tablet 0.5 mg    furosemide (LASIX) injection 80 mg    potassium chloride (KLOR-CON) ER tablet 40 mEq    Prostacyclin Cassette Change    Prostacyclin Ice Pack    Prostacyclin Tubing    Prostacylin Batteries    sildenafil (REVATIO, VIAGRA) tablet 80 mg    sodium chloride (PF) 0.9 % flush 3 mL    sodium chloride (PF) 0.9 % flush 3 mL    sodium chloride 0.9 % TKO infusion    spironolactone (ALDACTONE) tablet 50 mg       VS:  Temperature:  [97.5 F (36.4 C)-99 F (37.2 C)] 98 F (36.7 C) (05/02 1200)  Blood pressure (BP): (117-143)/(57-77) 117/64 (05/02 1200)  Heart Rate:  [93-100] 99 (05/02 0751)  Respirations:  [18-20] 18 (05/01 2000)  Pain Score: 0 (05/02 0940)  O2 Device: Nasal cannula (05/02 1200)  O2 Flow Rate (L/min):  [2 l/min-3 l/min] 2 l/min (05/02 1200)  SpO2:  [93 %-97 %] 94 % (05/02 1200)    Intake/Output Summary (Last 24 hours) at 05/14/16 1449      Exam:  Gen: NAD, comfortable in bed  NECK: + JVD  CV: RRR, + TR murmur no S3  RESP: Rales baselung fields bilat  ABD: moderate distention, non tender  EXT: 1 + edema  NEURO: non-focal, grossly intact      Results reviewed, pertinent findings:  Lab Results   Component Value Date    WBC 3.9 (L) 05/12/2016    RBC 4.33 05/12/2016    HGB 10.5 (L) 05/12/2016    HCT 34.7 05/12/2016    MCV 80.1 05/12/2016    MCHC 30.3 (L) 05/12/2016    RDW 16.8 (H) 05/12/2016    PLT 91 (L) 05/12/2016    PLT 179 09/12/2008    MPV 9.7 05/12/2016     Lab Results   Component Value Date    NA 138 05/14/2016    K 3.0 (L) 05/14/2016    CL 97 (L) 05/14/2016    BICARB 28 05/14/2016    BUN 44 (H) 05/14/2016    CREAT 1.17 (H) 05/14/2016    GLU 124 (H) 05/14/2016    [place] 9.9 05/14/2016     Lab Results   Component Value Date    AST 28 05/12/2016    ALT 26 05/12/2016    ALK 175 (H) 05/12/2016    TP 7.5 05/12/2016    ALB 3.2 (L) 05/12/2016    TBILI 0.43 05/12/2016    DBILI 0.1 06/06/2011     RPNA coronavirus positive  Cocci IgG was negative  C3/C4 normal  CT scan shows diffuse GG, micronodules, thin walled cysts.   PFTs show mixed disease, obstruction appears worse than prior, otherwise no change  since 2012    Assessment and Plan:  10F with SLE and PAH presenting with dyspnea. Active issues include...  1. Acute decompensated RH failure/volume overload  2. A/c hypoxemic respiratory failure  3. SLE  4. 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 Elevated uric acid levels    Still overloaded and diuresis continues.    Continue to replace the K and Cl and follow the serum bicarb.  Continue IV epo @ 29 ng and sildenafil 80 mg TID.  Considering adding maci, patient and husband are concerned about ADRs specifically transaminitis.  Continue colchicine and uloric for gout.  Off warfarin, limited evidence to support the use of VKA therapy in [facility] relatedto CTD.  Diet.  SCDs.   Med surg with telemetry.  Full Code/Care.    Patient seen and discussed with Dr [name].    Ocie Doyne, M.D.  Fellow  Department of Pulmonary and Critical Care Medicine  [phone]

## 2016-05-14 NOTE — Plan of Care (Signed)
Problem: Breathing Pattern - Ineffective  Goal: Respiratory rate, rhythm and depth return to baseline  Outcome: Progressing toward goal, anticipate improvement over: >48 hours  Increased activity tolerance  3 liters with ambulation walks big loop 6 times in a row     Problem: Tissue Perfusion, Cardiopulmonary - Altered  Goal: Hemodynamic stability  Outcome: Progressing toward goal, anticipate improvement over: >48 hours  On lasix QID tolerating well with good outputs and vss   Continue to monitor     Problem: Falls, Risk of  Goal: Keep patient free from falls utilizing universal fall precautions  Outcome: Progressing toward goal, anticipate improvement over: >48 hours  Steady gait     Problem: Fluid Volume Excess  Goal: Absence of edema; peripheral and/or pulmonary  Outcome: Progressing toward goal, anticipate improvement over: >48 hours  Continues on Lasix QID with good outputs

## 2016-05-14 NOTE — Interdisciplinary (Signed)
05/14/16 1256   Assessment   Assessment Type Initial   Referral Information   Referral Type Verbal   Social Assessment   Where was the patient admitted from? home    Prior to Level of Function Ambulatory/Independent with ADL's   Primary Caretaker(s) Spouse   Permission to Contact Yes   SOCIAL WORK NOTE:    SW received page from assigned Rn that Pt's spouse is requesting assistance with meals.  Pt and spouse experiencing hardship, are here from [place].  SW Publishing rights manager to request meal vouchers.  SW awaiting response/approval. Assigned Rn informed that request has been placed. SW to remain available.    Loura Pardon, MSW  Clinical Social Worker ll  [phone]

## 2016-05-15 LAB — BASIC METABOLIC PANEL, BLOOD
Anion Gap: 12 mmol/L (ref 7–15)
Anion Gap: 10 mmol/L (ref 7–15)
BUN: 48 mg/dL — ABNORMAL HIGH (ref 8–23)
BUN: 50 mg/dL — ABNORMAL HIGH (ref 8–23)
Bicarbonate: 29 mmol/L (ref 22–29)
Bicarbonate: 32 mmol/L — ABNORMAL HIGH (ref 22–29)
Calcium: 9.9 mg/dL (ref 8.5–10.6)
Calcium: 10.1 mg/dL (ref 8.5–10.6)
Chloride: 97 mmol/L — ABNORMAL LOW (ref 98–107)
Chloride: 97 mmol/L — ABNORMAL LOW (ref 98–107)
Creatinine: 1.24 mg/dL — ABNORMAL HIGH (ref 0.51–0.95)
Creatinine: 1.2 mg/dL — ABNORMAL HIGH (ref 0.51–0.95)
GFR: 43 mL/min
GFR: 45 mL/min
Glucose: 107 mg/dL — ABNORMAL HIGH (ref 70–99)
Glucose: 134 mg/dL — ABNORMAL HIGH (ref 70–99)
Potassium: 3.6 mmol/L (ref 3.5–5.1)
Potassium: 3.7 mmol/L (ref 3.5–5.1)
Sodium: 138 mmol/L (ref 136–145)
Sodium: 139 mmol/L (ref 136–145)

## 2016-05-15 LAB — URIC ACID, BLOOD: Uric Acid: 8.7 mg/dL — ABNORMAL HIGH (ref 2.4–5.7)

## 2016-05-15 LAB — MAGNESIUM, BLOOD: Magnesium: 2 mg/dL (ref 1.6–2.4)

## 2016-05-15 LAB — PRO BNP, BLOOD: BNPP: 7514 pg/mL — ABNORMAL HIGH (ref 0–899)

## 2016-05-15 MED ORDER — BUMETANIDE 2 MG OR TABS
2.0000 mg | ORAL_TABLET | Freq: Three times a day (TID) | ORAL | Status: DC
Start: 2016-05-15 — End: 2016-05-16
  Administered 2016-05-15 – 2016-05-16 (×4): 2 mg via ORAL
  Filled 2016-05-15 (×6): qty 1

## 2016-05-15 NOTE — Progress Notes (Signed)
PULMONARY/CRITICAL CARE ATTENDING NOTE  Current Hospital 12 Days 22 Hours      Medications:  Scheduled Meds   azelastine  1 spray BID    bumetanide  2 mg TID    colchicine  0.6 mg Daily    docusate sodium  250 mg Daily    febuxostat  40 mg Daily    ferrous sulfate  540 mg Daily    folic acid  0.5 mg Daily    potassium chloride  40 mEq Daily    Prostacyclin Cassette Change   Daily    Prostacyclin Ice Pack   Q6H    Prostacyclin Tubing   Once per day on Mon Wed Fri    Prostacylin Batteries   Once per day on Mon    sildenafil  80 mg TID    sodium chloride (PF)  3 mL Q8H    spironolactone  50 mg Daily     PRN Meds   acetaminophen  650 mg Q6H PRN    sodium chloride (PF)  3 mL PRN    sodium chloride   Continuous PRN     IV Meds   epoprostenol (FLOLAN) infusion 29 ng/kg/min (05/15/16 0800)    sodium chloride         Vitals:    Latest Entry  Range (last 24 hours)    Temperature: 98.1 F (36.7 C)  Temp  Avg: 97.9 F (36.6 C)  Min: 97.6 F (36.4 C)  Max: 98.1 F (36.7 C)    Blood pressure (BP): 126/74  BP  Min: 117/64  Max: 147/81    Heart Rate: 95  Pulse  Avg: 95.2  Min: 92  Max: 98    Respirations: 20  Resp  Avg: 20  Min: 20  Max: 20    SpO2: 96 %  SpO2  Avg: 94.8 %  Min: 94 %  Max: 96 %       No Data Recorded     Weight: 73.5 kg (162 lb 1.6 oz)  Percentage Weight Change (%): -0.98 %       Intake/Output Summary (Last 24 hours) at 05/15/16 1040  Last data filed at 05/15/16 0800   Gross per 24 hour   Intake           779.92 ml   Output             3270 ml   Net         -2490.08 ml       Exam:  Gen: NAD, comfortable in chair  NECK: + JVD  CV: RRR, + TR murmur no S3  RESP: Rales baselung fields bilat  ABD: moderate distention, non tender  EXT: Trace edema  NEURO: non-focal, grossly intact    Labs:     Chemistry  Recent Labs      05/14/16   0554  05/14/16   1824  05/15/16   0510   NA  138  139  138   K  3.0*  4.1  3.6   CL  97*  97*  97*   BICARB  28  33*  29   BUN  44*  46*  48*   CREAT  1.17*  1.20*   1.24*   GLU  124*  119*  107*   [place]  9.9  10.3  9.9   MG  2.1   --   2.0       ASSESSMENT/PLAN:  66 yo F with 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 Trigger for decompensation unclear - may be related to concurrent viral infection. Edema improved.  Cr mildly elevated this AM with rising BUN.  BNPP remains 7000.    -Transition to PO bumex 2 mg TID  -Continue IV epo @ 29 ng  -Continue sildenafil 80 mg TID  -Start macitentan 10 mg PO QDAY.  -Appreciate rheum input - no role for immunosuppression at this time.  -Continue colchicine and uloric for gout  -Continue supplemental iron for anemia   -Does not need to resume as outpatient as there is limited evidence to support the use of Vit K A therapy in [facility] relatedto CTD.    # Code Status:  Full Code    [place] Scrape.D.

## 2016-05-15 NOTE — Plan of Care (Signed)
Problem: Breathing Pattern - Ineffective  Goal: Arterial blood gas values and/or saturation return to baseline  Outcome: Progressing toward goal, anticipate improvement over: >48 hours    Goal: Respiratory rate, rhythm and depth return to baseline  Outcome: Progressing toward goal, anticipate improvement over: >48 hours    Goal: Minimal or absent use of accessory muscles  Outcome: Progressing toward goal, anticipate improvement over: >48 hours      Problem: Tissue Perfusion, Cardiopulmonary - Altered  Goal: Hemodynamic stability  Outcome: Progressing toward goal, anticipate improvement over: >48 hours      Problem: Falls, Risk of  Goal: Keep patient free from falls utilizing universal fall precautions  Outcome: Progressing toward goal, anticipate improvement over: >48 hours      Problem: Fluid Volume Excess  Goal: Absence of edema; peripheral and/or pulmonary  Outcome: Progressing toward goal, anticipate improvement over: >48 hours

## 2016-05-16 ENCOUNTER — Other Ambulatory Visit (HOSPITAL_COMMUNITY): Payer: Self-pay | Admitting: Pulmonary Medicine

## 2016-05-16 ENCOUNTER — Encounter (HOSPITAL_COMMUNITY): Admission: EM | Disposition: A | Discharge status: Home Health | Payer: Self-pay | Attending: Pulmonary Medicine

## 2016-05-16 ENCOUNTER — Inpatient Hospital Stay (HOSPITAL_BASED_OUTPATIENT_CLINIC_OR_DEPARTMENT_OTHER): Admit: 2016-05-16 | Payer: Medicare Other | Admitting: Pulmonary Medicine

## 2016-05-16 LAB — BASIC METABOLIC PANEL, BLOOD
Anion Gap: 13 mmol/L (ref 7–15)
BUN: 48 mg/dL — ABNORMAL HIGH (ref 8–23)
Bicarbonate: 30 mmol/L — ABNORMAL HIGH (ref 22–29)
Calcium: 10 mg/dL (ref 8.5–10.6)
Chloride: 95 mmol/L — ABNORMAL LOW (ref 98–107)
Creatinine: 1.16 mg/dL — ABNORMAL HIGH (ref 0.51–0.95)
GFR: 47 mL/min
Glucose: 89 mg/dL (ref 70–99)
Potassium: 3.7 mmol/L (ref 3.5–5.1)
Sodium: 138 mmol/L (ref 136–145)

## 2016-05-16 LAB — MAGNESIUM, BLOOD: Magnesium: 2.2 mg/dL (ref 1.6–2.4)

## 2016-05-16 LAB — URIC ACID, BLOOD: Uric Acid: 8.6 mg/dL — ABNORMAL HIGH (ref 2.4–5.7)

## 2016-05-16 SURGERY — INSERTION, CATHETER, RIGHT HEART

## 2016-05-16 MED ORDER — COLCHICINE 0.6 MG OR TABS
ORAL_TABLET | ORAL | 0 refills | Status: DC
Start: 2016-05-16 — End: 2017-03-09

## 2016-05-16 MED ORDER — SPIRONOLACTONE 50 MG OR TABS
ORAL_TABLET | ORAL | 0 refills | Status: DC
Start: 2016-05-16 — End: 2017-07-15

## 2016-05-16 MED ORDER — SILDENAFIL CITRATE 20 MG OR TABS
80.00 mg | ORAL_TABLET | ORAL | 0 refills | Status: DC
Start: 2016-05-16 — End: 2017-07-15

## 2016-05-16 MED ORDER — FERROUS SULFATE 324 (65 FE) MG PO TBEC
324.0000 mg | DELAYED_RELEASE_TABLET | Freq: Every day | ORAL | 0 refills | Status: AC
Start: 2016-05-17 — End: ?

## 2016-05-16 MED ORDER — BUMETANIDE 2 MG OR TABS
2.00 mg | ORAL_TABLET | ORAL | 0 refills | Status: DC
Start: 2016-05-16 — End: 2017-03-09

## 2016-05-16 MED ORDER — COLCHICINE 0.6 MG OR TABS
0.6000 mg | ORAL_TABLET | Freq: Every day | ORAL | 0 refills | Status: DC
Start: 2016-05-17 — End: 2016-06-21

## 2016-05-16 MED ORDER — FERROUS SULFATE 325 (65 FE) MG OR TABS
325.00 mg | ORAL_TABLET | ORAL | 0 refills | Status: DC
Start: 2016-05-16 — End: 2017-03-09

## 2016-05-16 MED ORDER — POTASSIUM CHLORIDE CRYS CR 10 MEQ OR TBCR
EXTENDED_RELEASE_TABLET | ORAL | 0 refills | Status: DC
Start: 2016-05-16 — End: 2017-07-15

## 2016-05-16 MED ORDER — FEBUXOSTAT 40 MG PO TABS
40.00 mg | ORAL_TABLET | ORAL | 0 refills | Status: DC
Start: 2016-05-16 — End: 2017-03-09

## 2016-05-16 MED ORDER — FEBUXOSTAT 40 MG PO TABS
40.0000 mg | ORAL_TABLET | Freq: Every day | ORAL | 0 refills | Status: DC
Start: 2016-05-17 — End: 2016-07-28

## 2016-05-16 MED ORDER — BUMETANIDE 2 MG OR TABS
2.0000 mg | ORAL_TABLET | Freq: Three times a day (TID) | ORAL | 0 refills | Status: DC
Start: 2016-05-16 — End: 2016-06-21

## 2016-05-16 MED ORDER — SPIRONOLACTONE 50 MG OR TABS
50.0000 mg | ORAL_TABLET | Freq: Every day | ORAL | 0 refills | Status: DC
Start: 2016-05-17 — End: 2016-07-28

## 2016-05-16 MED ORDER — POTASSIUM CHLORIDE CRYS CR 10 MEQ OR TBCR
40.0000 meq | EXTENDED_RELEASE_TABLET | Freq: Every day | ORAL | 0 refills | Status: DC
Start: 2016-05-17 — End: 2017-04-01

## 2016-05-16 MED ORDER — SILDENAFIL CITRATE 20 MG OR TABS
80.0000 mg | ORAL_TABLET | Freq: Three times a day (TID) | ORAL | 0 refills | Status: DC
Start: 2016-05-16 — End: 2017-03-07

## 2016-05-16 MED FILL — BUMETANIDE TAB 2 MG: MG | 30 days supply | Qty: 90 | Fill #0

## 2016-05-16 MED FILL — FERROUS SULFATE TAB 324 MG: MG | 30 days supply | Qty: 30 | Fill #0

## 2016-05-16 MED FILL — FEBUXOSTAT TAB 40 MG: MG | 30 days supply | Qty: 30 | Fill #0

## 2016-05-16 MED FILL — COLCHICINE TAB 0.6 MG: MG | 30 days supply | Qty: 30 | Fill #0

## 2016-05-16 NOTE — Progress Notes (Signed)
Pharmacist Discharge Medication Reconciliation    After Visit Summary (AVS) medication list was reviewed by pharmacist.     No findings upon review.    Of note, this does not include a review on prescription coverage.  If further assistance with coverage is needed, please call the pharmacy where the discharge medications were sent.    Kalliopi Coupland, PharmD, BCACP  Pager: [phone]  PID: [id number]

## 2016-05-16 NOTE — Interdisciplinary (Addendum)
Discharge instruction given included medication, diet ,activity and f/u with lab and appointment.  A copy of discharge instruction given to the patient. One extra flolan cassette given to  the patient. The patient  has no further question at this time.

## 2016-05-16 NOTE — Plan of Care (Addendum)
Problem: Breathing Pattern - Ineffective  Goal: Arterial blood gas values and/or saturation return to baseline  Outcome: Progressing toward goal, anticipate improvement over: >48 hours  Pulmonary arterial hypertension, Hx Systemic Lupus Erythematosus CC: Dyspnea, Right heart failure, fluid overload. SR w/ PAC. Hemodynamic is stable. Lung sounds clear to A, spo2 shows 95% at 2 liters of oxygen via N/C. The patient stated," I am feeling better today."     Problem: Tissue Perfusion, Cardiopulmonary - Altered  Goal: Hemodynamic stability  Outcome: Progressing toward goal, anticipate improvement over: Next 24-48 hours  Right heart failure, fluid overload. BLE edema 2+. Denies chest pain 0/10.    Problem: Fluid Volume Excess  Goal: Absence of edema; peripheral and/or pulmonary  Outcome: Progressing toward goal, anticipate improvement over: >48 hours  Will monitor daily weight, and I/O.

## 2016-05-16 NOTE — Plan of Care (Signed)
Problem: Breathing Pattern - Ineffective  Goal: Arterial blood gas values and/or saturation return to baseline  Outcome: Goal Met  Patient on home O2 flow of 2L NC.  Denies dyspnea at rest, some dyspnea on exertion.  On Flolan at home rate, see eMAR.  Goal: Respiratory rate, rhythm and depth return to baseline  Outcome: Goal Met    Goal: Minimal or absent use of accessory muscles  Outcome: Goal Met      Problem: Tissue Perfusion, Cardiopulmonary - Altered  Goal: Hemodynamic stability  Outcome: Goal Met  Patient VS stable, NSR with 1 degree AVB per monitor.  Denies CP.    Problem: Falls, Risk of  Goal: Keep patient free from falls utilizing universal fall precautions  Outcome: Goal Met  Pt educated on fall risk prevention. Bed in low locked position, call bell within reach, and path cleared. No falls at this time      Problem: Fluid Volume Excess  Goal: Absence of edema; peripheral and/or pulmonary  Outcome: Progressing toward goal, anticipate improvement over: Next 24-48 hours  Patient changed to PO diuresis. (See eMAR). Daily weight. FR of 2000.  BLE improves while feet are up, increases during daytime. Patient states improved since admission. Continue to monitor.

## 2016-05-16 NOTE — Discharge Summary (Signed)
Pulmonary Vascular Discharge Summary    Patient Name:  Diana Chambers    Principal Diagnosis (required):  Pulmonary Arterial Hypertension    Hospital Problem List (required):  Active Hospital Problems    Diagnosis    Volume overload [E87.70]      Resolved Hospital Problems    Diagnosis   No resolved problems to display.     Additional Hospital Diagnoses ("rule out" or "suspected" diagnoses, etc.):  Acutely decompensated on chronic right heart failure with volume overload.  Acute on chronic hypoxemic respiratory failure.  SLE without evidence of flare.  Hyperuricemia.    Principal Procedure During This Hospitalization (required):  None    Other Procedures Performed During This Hospitalization (required):  CT imaging of chest    Procedure results are available in Chart Review in Epic.      Consultations Obtained During This Hospitalization:  Rheumatology    Key consultant recommendations:  Patient was started on colchicine and febuxostat for hyperuricemia and prevention of gout flare.  They did not think that patient's SLE was active.  See their consultation notes for further details.      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  She was in her USOH when 1 week PTA patient noticed leg and abdominal swelling and dyspnea mainly with exertion.  She attempted to increase her furosemide to tid dosing without much increase in her UOP or improvement in her syx.  Over the course of 1 week she has also noticed fevers (with rise in temp from baseline of 97) to 99.6 deg F.  She has had some mild intermittent cough, and endorses pleuritic chest pain also 1 week in duration.  She thinks she may have had some fleeting joint pains.  No palps, lightheadedness or syncope.  Reports CVC has been working Remainder ROS was unremarkable.          Hospital Course by Problem (required):  The patient was admitted to the pulmonary vascular service and given diuretics with potassium replacement as needed.  She was continued on her home PAH therapy sildenafil and epoprostenol.  Laboratory testing was performed to try and determine the activity of the patient's SLE, but this was unremarkable.  In conjunction with rheumatology the patient was started on urate lowering therapy.  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  During this 2 week admission the patient was diuresed 27 L (by I/O tabulation), and her chest tightness, abd and leg swelling, exercise tolerance and supplemental oxygen requirements improved.  She is now stable for discharge to home with her husband.  PH and rheumatology follow-up will be arranged.     Tests Outstanding at Discharge Requiring Follow Up:  None    Discharge Condition (required):  Good.    Key Physical Exam Findings at Discharge:  No significant physical examination findings at the time of discharge.    Core Measures for Heart Failure:  The patient was not required to have her left ventricular ejection fraction measured, and is not required to have a prescription for an ACE inhibitor, angiotensin receptor blocker, or beta blocker, as her heart failure syndrome is that of right-sided heart failure due to pulmonary hypertension.    Discharge Diet:  Low-salt.  Discharge Medications:     What To Do With Your Medications      START taking these medications       Add'l Info    bumetanide 2 MG tablet   Commonly known as:  BUMEX   Take 1 tablet (2 mg) by mouth 3 times daily.    Quantity:  90 tablet   Refills:  0       colchicine 0.6 MG tablet   Take 1 tablet (0.6 mg) by mouth daily.   Start taking on:  05/17/2016    Quantity:  30 tablet   Refills:  0       febuxostat 40 MG Tabs   Commonly known as:  ULORIC   Take 1 tablet (40 mg) by mouth daily.   Start taking  on:  05/17/2016    Quantity:  30 tablet   Refills:  0       ferrous sulfate 324 (65 FE) MG Tbec   Take 1 tablet (324 mg) by mouth daily.   Start taking on:  05/17/2016    Quantity:  30 tablet   Refills:  0         CHANGE how you take these medications       Add'l Info    potassium chloride 10 MEQ tablet   Commonly known as:  KLOR-CON   Take 4 tablets (40 mEq) by mouth daily.   Start taking on:  05/17/2016    Quantity:  120 tablet   Refills:  0   What changed:    - medication strength  - how much to take  - when to take this       sildenafil 20 MG tablet   Commonly known as:  REVATIO   Take 4 tablets (80 mg) by mouth 3 times daily.    Quantity:  360 tablet   Refills:  0   What changed:  additional instructions       spironolactone 50 MG tablet   Commonly known as:  ALDACTONE   Take 1 tablet (50 mg) by mouth daily.   Start taking on:  05/17/2016    Quantity:  30 tablet   Refills:  0   What changed:  See the new instructions.         CONTINUE taking these medications       Add'l Info    azelastine 0.1 % nasal spray   Commonly known as:  ASTELIN   Spray 1 spray into each nostril 2 times daily. Use in each nostril as directed    Quantity:  1 bottle   Refills:  3       digoxin 0.25 MG tablet   Commonly known as:  LANOXIN   Take 250 mcg by mouth daily.    Refills:  0       DIVIGEL 0.25 MG/0.25GM Gel   Apply 0.25 mg topically nightly.   Generic drug:  Estradiol    Refills:  0       estradiol 2 MG vaginal ring   Commonly known as:  ESTRING   Insert 1 Ring vaginally Every 3 months. Follow package directions    Refills:  0       FLOLAN injection   29ng/kg/min   Generic drug:  epoprostenol    Refills:  0       folic acid 469 MCG tablet   Commonly known as:  FOLVITE   1 TABLET DAILY    Refills:  0  medroxyPROGESTERone 2.5 MG tablet   Commonly known as:  PROVERA   Take 1.25 mg by mouth daily.    Refills:  0       vitamin D3 2000 UNITS capsule   Take 1 capsule by mouth daily.    Refills:  0         STOP taking these medications           furosemide 20 MG tablet   Commonly known as:  LASIX       warfarin 5 MG tablet   Commonly known as:  COUMADIN            Where to Get Your Medications      These medications were sent to [facility]  [address], [place] [address]    Hours:  Mon-Fri 8:30am-7:00pm, Sat-Sun 9am-5:00pm Phone:  [phone]     bumetanide 2 MG tablet    colchicine 0.6 MG tablet    febuxostat 40 MG Tabs    ferrous sulfate 324 (65 FE) MG Tbec    potassium chloride 10 MEQ tablet    sildenafil 20 MG tablet    spironolactone 50 MG tablet             Allergies:  Allergies   Allergen Reactions    Allopurinol Hives    Levaquin Swelling and Other     Severe joint pain      Ofloxacin Nausea and Vomiting    Chlorhexidine Itching    Sulfa Drugs Unspecified       Discharge Disposition:  Home.    Discharge Code Status:  Full code / full care  This code status is not changed from the time of admission.    Follow Up Appointments:  [facility] (Rheumatology) 4-8 weeks post-discharge [phone].  Pulmonary Hypertension Follow-Up (Dr [name]).    For appointments requested for after discharge that have not yet been scheduled, refer to the Post Discharge Referrals section of the After Visit Summary.    Discharging 60 Contact Information:  [facility] operator at [phone].    Discussed with Dr [name].      Ocie Doyne, M.D.  Fellow  Department of Pulmonary and Critical Care Medicine  [phone]

## 2016-05-16 NOTE — Interdisciplinary (Signed)
Notified the pharmacist to have two flolan cassettes for the patient is discharged home today.

## 2016-05-16 NOTE — Discharge Instructions (Signed)
Follow Up Appointments:  Please keep your follow up appointments with Dr. [name].    If you do not have an appointment please call [phone] to make one.  You should be seen in Pulmonary Hypertension clinic within 3-4 weeks    Activity:  You may resume your usual activities as before your recent admission.    Diet:   Resume your diet as per before your admission. Avoid excess fluids (more than 2 Liters per day) and salt (more than 2 grams per day).    Medications:  You will be provided with a list of medications to take.   You may have labs drawn as an outpatient and have results faxed to [phone]    Pulmonary Hypertension and Heart Failure Special Instructions:  Pulmonary Hypertension can cause Right Heart failure, which can benefit from specific changes to your diet.   - Be sure to remove the salt shaker from your table.   - Read your food labels. Look for products labeled no added salt, sodium-free, low sodium, or very low sodium.     In addition, there are certain symptoms that should cause you to contact your physician for advice. Call your physician if:   - You have weight gain of more than 3 pounds in 3 days or less.   - You have sudden, new, or worsening shortness of breath.   - You have increased swelling of the legs, ankles, or feet.     Monitoring your weight each day at home is very important. Weigh yourself daily.   You may notice weight gain associated with fluid retention. If this is greater than normal contact your doctor.     Notify physician or return to the hospital:  Let your doctor know, or come back to the hospital if you witness any of the following: Chest pain or heaviness, palpitations, lightheadedness, weakness, nausea or vomiting, bleeding that is not stopping (especially if you are on blood thinners), new high fevers, worsening shortness of breath, fainting or very rapid heart rates.

## 2016-05-20 ENCOUNTER — Telehealth (INDEPENDENT_AMBULATORY_CARE_PROVIDER_SITE_OTHER): Payer: Self-pay | Admitting: Internal Medicine

## 2016-05-20 ENCOUNTER — Telehealth (HOSPITAL_BASED_OUTPATIENT_CLINIC_OR_DEPARTMENT_OTHER): Payer: Self-pay

## 2016-05-20 NOTE — Telephone Encounter (Signed)
Transitional Telephonic Nurse (TTN)  Patient unreached via post discharge calls.    [phone] (Mobile)-Line was ringing, then busy tone, voicemail not accessible.   [phone] (Home)- Line was ringing, then busy tone, voicemail not accessible.    MJ Shanon Brow, MSN, PCCN, RN-BC  [place]  Transitional Telephonic Nursing  T: [phone]   [email or web address]

## 2016-05-20 NOTE — Telephone Encounter (Signed)
Received a message from [place] that patient has been discharged from Hospital and needs to see either Dr.Lee or Dr.Singh in about 4 weeks. Made multiple attempts yesterday and today to contact patient. Mobile number just rings and rings. Home number rings and then gets a busy signal. If patient calls back please schedule as a new patient as her last visit in clinic was 2015.

## 2016-05-23 ENCOUNTER — Encounter (HOSPITAL_COMMUNITY): Payer: Self-pay

## 2016-05-23 ENCOUNTER — Telehealth (HOSPITAL_COMMUNITY): Payer: Self-pay

## 2016-05-23 NOTE — Telephone Encounter (Signed)
Pt called c/o weight gain and "swollen belly".  Per Dr. [name] instructed to take Metolazone 2.5mg  X1 and to call on Monday.  RHC on schedule for next Tuesday if needed.  Prescription called into [facility].

## 2016-05-27 ENCOUNTER — Inpatient Hospital Stay
Admission: RE | Admit: 2016-05-27 | Discharge: 2016-06-21 | DRG: 286 | Disposition: A | Discharge status: Home / Self Care | Payer: Medicare Other | Attending: Pulmonary Medicine | Admitting: Pulmonary Medicine

## 2016-05-27 ENCOUNTER — Inpatient Hospital Stay (HOSPITAL_COMMUNITY): Payer: Medicare Other

## 2016-05-27 ENCOUNTER — Encounter (HOSPITAL_COMMUNITY): Admission: RE | Disposition: A | Discharge status: Home Health | Payer: Self-pay | Attending: Pulmonary Medicine

## 2016-05-27 DIAGNOSIS — Z23 Encounter for immunization: Secondary | ICD-10-CM

## 2016-05-27 DIAGNOSIS — I50812 Chronic right heart failure: Secondary | ICD-10-CM

## 2016-05-27 DIAGNOSIS — D61818 Other pancytopenia: Secondary | ICD-10-CM | POA: Diagnosis not present

## 2016-05-27 DIAGNOSIS — Z881 Allergy status to other antibiotic agents status: Secondary | ICD-10-CM

## 2016-05-27 DIAGNOSIS — I517 Cardiomegaly: Secondary | ICD-10-CM

## 2016-05-27 DIAGNOSIS — K8020 Calculus of gallbladder without cholecystitis without obstruction: Secondary | ICD-10-CM | POA: Diagnosis present

## 2016-05-27 DIAGNOSIS — I2721 Secondary pulmonary arterial hypertension: Secondary | ICD-10-CM | POA: Diagnosis present

## 2016-05-27 DIAGNOSIS — J8489 Other specified interstitial pulmonary diseases: Secondary | ICD-10-CM | POA: Diagnosis present

## 2016-05-27 DIAGNOSIS — N183 Chronic kidney disease, stage 3 (moderate): Secondary | ICD-10-CM | POA: Diagnosis present

## 2016-05-27 DIAGNOSIS — M71311 Other bursal cyst, right shoulder: Secondary | ICD-10-CM | POA: Diagnosis present

## 2016-05-27 DIAGNOSIS — Y92230 Patient room in hospital as the place of occurrence of the external cause: Secondary | ICD-10-CM | POA: Diagnosis not present

## 2016-05-27 DIAGNOSIS — Z9981 Dependence on supplemental oxygen: Secondary | ICD-10-CM

## 2016-05-27 DIAGNOSIS — E876 Hypokalemia: Secondary | ICD-10-CM | POA: Diagnosis not present

## 2016-05-27 DIAGNOSIS — D649 Anemia, unspecified: Secondary | ICD-10-CM

## 2016-05-27 DIAGNOSIS — M65841 Other synovitis and tenosynovitis, right hand: Secondary | ICD-10-CM | POA: Diagnosis present

## 2016-05-27 DIAGNOSIS — K761 Chronic passive congestion of liver: Secondary | ICD-10-CM | POA: Diagnosis present

## 2016-05-27 DIAGNOSIS — I50813 Acute on chronic right heart failure: Principal | ICD-10-CM | POA: Diagnosis present

## 2016-05-27 DIAGNOSIS — D696 Thrombocytopenia, unspecified: Secondary | ICD-10-CM | POA: Diagnosis present

## 2016-05-27 DIAGNOSIS — R188 Other ascites: Secondary | ICD-10-CM | POA: Diagnosis present

## 2016-05-27 DIAGNOSIS — Z8249 Family history of ischemic heart disease and other diseases of the circulatory system: Secondary | ICD-10-CM

## 2016-05-27 DIAGNOSIS — T502X5A Adverse effect of carbonic-anhydrase inhibitors, benzothiadiazides and other diuretics, initial encounter: Secondary | ICD-10-CM | POA: Diagnosis not present

## 2016-05-27 DIAGNOSIS — I071 Rheumatic tricuspid insufficiency: Secondary | ICD-10-CM | POA: Diagnosis present

## 2016-05-27 DIAGNOSIS — M329 Systemic lupus erythematosus, unspecified: Secondary | ICD-10-CM | POA: Diagnosis present

## 2016-05-27 DIAGNOSIS — B9561 Methicillin susceptible Staphylococcus aureus infection as the cause of diseases classified elsewhere: Secondary | ICD-10-CM | POA: Diagnosis not present

## 2016-05-27 DIAGNOSIS — N390 Urinary tract infection, site not specified: Secondary | ICD-10-CM | POA: Diagnosis not present

## 2016-05-27 DIAGNOSIS — Z883 Allergy status to other anti-infective agents status: Secondary | ICD-10-CM

## 2016-05-27 DIAGNOSIS — Z882 Allergy status to sulfonamides status: Secondary | ICD-10-CM

## 2016-05-27 DIAGNOSIS — Z888 Allergy status to other drugs, medicaments and biological substances status: Secondary | ICD-10-CM

## 2016-05-27 DIAGNOSIS — J9621 Acute and chronic respiratory failure with hypoxia: Secondary | ICD-10-CM | POA: Diagnosis not present

## 2016-05-27 DIAGNOSIS — M2540 Effusion, unspecified joint: Secondary | ICD-10-CM

## 2016-05-27 DIAGNOSIS — Z87891 Personal history of nicotine dependence: Secondary | ICD-10-CM

## 2016-05-27 DIAGNOSIS — N179 Acute kidney failure, unspecified: Secondary | ICD-10-CM | POA: Diagnosis not present

## 2016-05-27 DIAGNOSIS — M3500 Sicca syndrome, unspecified: Secondary | ICD-10-CM | POA: Diagnosis present

## 2016-05-27 DIAGNOSIS — M109 Gout, unspecified: Secondary | ICD-10-CM | POA: Diagnosis present

## 2016-05-27 LAB — COMPREHENSIVE METABOLIC PANEL, BLOOD
ALT (SGPT): 13 U/L (ref 0–33)
AST (SGOT): 18 U/L (ref 0–32)
Albumin: 3.5 g/dL (ref 3.5–5.2)
Alkaline Phos: 169 U/L — ABNORMAL HIGH (ref 35–140)
Anion Gap: 13 mmol/L (ref 7–15)
BUN: 49 mg/dL — ABNORMAL HIGH (ref 8–23)
Bicarbonate: 26 mmol/L (ref 22–29)
Bilirubin, Tot: 0.53 mg/dL (ref <1.2)
Calcium: 9.9 mg/dL (ref 8.5–10.6)
Chloride: 98 mmol/L (ref 98–107)
Creatinine: 1.32 mg/dL — ABNORMAL HIGH (ref 0.51–0.95)
GFR: 40 mL/min
Glucose: 83 mg/dL (ref 70–99)
Potassium: 3.5 mmol/L (ref 3.5–5.1)
Sodium: 137 mmol/L (ref 136–145)
Total Protein: 7.5 g/dL (ref 6.0–8.0)

## 2016-05-27 LAB — CBC WITH DIFF, BLOOD
ANC-Automated: 2.3 10*3/uL (ref 1.6–7.0)
Abs Eosinophils: 0.1 10*3/uL (ref 0.1–0.5)
Abs Lymphs: 1 10*3/uL (ref 0.8–3.1)
Abs Monos: 0.3 10*3/uL (ref 0.2–0.8)
Basophils: 1 %
Eosinophils: 1 %
Hct: 35.1 % (ref 34.0–45.0)
Hgb: 11 gm/dL — ABNORMAL LOW (ref 11.2–15.7)
Imm Gran %: 2 % — ABNORMAL HIGH (ref <1)
Imm Gran Abs: 0.1 10*3/uL (ref <0.1)
Lymphocytes: 26 %
MCH: 24.7 pg — ABNORMAL LOW (ref 26.0–32.0)
MCHC: 31.3 g/dL — ABNORMAL LOW (ref 32.0–36.0)
MCV: 78.9 um3 — ABNORMAL LOW (ref 79.0–95.0)
Monocytes: 8 %
Plt Count: 88 10*3/uL — ABNORMAL LOW (ref 140–370)
RBC: 4.45 10*6/uL (ref 3.90–5.20)
RDW: 18.6 % — ABNORMAL HIGH (ref 12.0–14.0)
Segs: 63 %
WBC: 3.7 10*3/uL — ABNORMAL LOW (ref 4.0–10.0)

## 2016-05-27 LAB — URINALYSIS
Bilirubin: NEGATIVE
Blood: NEGATIVE
Glucose: NEGATIVE
Ketones: NEGATIVE
Leuk Esterase: NEGATIVE
Nitrite: NEGATIVE
Protein: NEGATIVE
Specific Gravity: 1.006 (ref 1.002–1.030)
Urobilinogen: NEGATIVE
pH: 6 (ref 5.0–8.0)

## 2016-05-27 LAB — PRO BNP, BLOOD: BNPP: 10630 pg/mL — ABNORMAL HIGH (ref 0–899)

## 2016-05-27 SURGERY — INSERTION, CATHETER, RIGHT HEART

## 2016-05-27 MED ORDER — COLCHICINE 0.6 MG OR TABS
0.6000 mg | ORAL_TABLET | Freq: Every day | ORAL | Status: DC
Start: 2016-05-28 — End: 2016-06-01
  Administered 2016-05-28 – 2016-05-31 (×4): 0.6 mg via ORAL
  Filled 2016-05-27 (×4): qty 1

## 2016-05-27 MED ORDER — SILDENAFIL CITRATE 20 MG OR TABS
80.0000 mg | ORAL_TABLET | Freq: Three times a day (TID) | ORAL | Status: DC
Start: 2016-05-27 — End: 2016-06-21
  Administered 2016-05-28 – 2016-06-21 (×71): 80 mg via ORAL
  Filled 2016-05-27 (×5): qty 4

## 2016-05-27 MED ORDER — MEDROXYPROGESTERONE ACETATE 2.5 MG OR TABS
1.2500 mg | ORAL_TABLET | Freq: Every day | ORAL | Status: DC
Start: 2016-05-28 — End: 2016-06-21
  Administered 2016-05-28 – 2016-06-21 (×25): 1.25 mg via ORAL
  Filled 2016-05-27 (×26): qty 1

## 2016-05-27 MED ORDER — FERROUS SULFATE 324 (65 FE) MG PO TBEC
324.0000 mg | DELAYED_RELEASE_TABLET | Freq: Every day | ORAL | Status: DC
Start: 2016-05-28 — End: 2016-06-21
  Administered 2016-05-28 – 2016-06-21 (×25): 324 mg via ORAL
  Filled 2016-05-27 (×25): qty 1

## 2016-05-27 MED ORDER — SODIUM CHLORIDE 0.9 % IJ SOLN (CUSTOM)
3.0000 mL | Freq: Three times a day (TID) | INTRAMUSCULAR | Status: DC
Start: 2016-05-27 — End: 2016-06-21
  Administered 2016-05-27 – 2016-06-20 (×54): 3 mL via INTRAVENOUS

## 2016-05-27 MED ORDER — SODIUM CHLORIDE 0.9 % IJ SOLN (CUSTOM)
3.0000 mL | INTRAMUSCULAR | Status: DC | PRN
Start: 2016-05-27 — End: 2016-06-21
  Administered 2016-05-30: 3 mL via INTRAVENOUS

## 2016-05-27 MED ORDER — POTASSIUM CHLORIDE CRYS CR 10 MEQ OR TBCR
40.0000 meq | EXTENDED_RELEASE_TABLET | Freq: Every day | ORAL | Status: DC
Start: 2016-05-27 — End: 2016-06-21
  Administered 2016-05-27 – 2016-06-21 (×26): 40 meq via ORAL
  Filled 2016-05-27 (×26): qty 4

## 2016-05-27 MED ORDER — NALOXONE HCL 0.4 MG/ML IJ SOLN
0.1000 mg | INTRAMUSCULAR | Status: DC | PRN
Start: 2016-05-27 — End: 2016-06-21

## 2016-05-27 MED ORDER — GLYCINE DILUENT IV SOLN
33.0000 ng/kg/min | INTRAVENOUS | Status: DC
Start: 2016-05-27 — End: 2016-06-02
  Administered 2016-05-27: 29 ng/kg/min via INTRAVENOUS
  Administered 2016-05-28: 30 ng/kg/min via INTRAVENOUS
  Administered 2016-05-29 (×2): 31 ng/kg/min via INTRAVENOUS
  Administered 2016-05-29: 30 ng/kg/min via INTRAVENOUS
  Administered 2016-05-30 (×2): 32 ng/kg/min via INTRAVENOUS
  Administered 2016-05-31 – 2016-06-01 (×4): 33 ng/kg/min via INTRAVENOUS
  Filled 2016-05-27 (×8): qty 4.5

## 2016-05-27 MED ORDER — MACITENTAN 10 MG PO TABS
10.0000 mg | ORAL_TABLET | Freq: Every day | ORAL | Status: DC
Start: 2016-05-27 — End: 2016-06-08
  Administered 2016-05-27 – 2016-06-08 (×13): 10 mg via ORAL
  Filled 2016-05-27 (×13): qty 1

## 2016-05-27 MED ORDER — AZELASTINE HCL 137 MCG/SPRAY NA SOLN
1.0000 | Freq: Two times a day (BID) | NASAL | Status: DC
Start: 2016-05-28 — End: 2016-05-31
  Administered 2016-05-28 – 2016-05-31 (×7): 1 via NASAL
  Filled 2016-05-27: qty 30

## 2016-05-27 MED ORDER — SODIUM CHLORIDE 0.9% TKO INFUSION
INTRAVENOUS | Status: DC | PRN
Start: 2016-05-27 — End: 2016-06-21

## 2016-05-27 MED ORDER — SPIRONOLACTONE 25 MG OR TABS
50.0000 mg | ORAL_TABLET | Freq: Every day | ORAL | Status: DC
Start: 2016-05-27 — End: 2016-06-21
  Administered 2016-05-27 – 2016-06-21 (×26): 50 mg via ORAL
  Filled 2016-05-27 (×26): qty 2

## 2016-05-27 MED ORDER — ACETAMINOPHEN 325 MG PO TABS
650.0000 mg | ORAL_TABLET | ORAL | Status: DC | PRN
Start: 2016-05-27 — End: 2016-06-21
  Administered 2016-06-01 – 2016-06-04 (×3): 650 mg via ORAL
  Filled 2016-05-27 (×3): qty 2

## 2016-05-27 MED ORDER — FOLIC ACID 1 MG OR TABS
0.5000 mg | ORAL_TABLET | Freq: Every day | ORAL | Status: DC
Start: 2016-05-28 — End: 2016-06-21
  Administered 2016-05-28 – 2016-06-21 (×25): 0.5 mg via ORAL
  Filled 2016-05-27 (×26): qty 1

## 2016-05-27 MED ORDER — HEPARIN SODIUM (PORCINE) PF 5000 UNIT/0.5ML IJ SOLN
5000.0000 [IU] | Freq: Two times a day (BID) | INTRAMUSCULAR | Status: DC
Start: 2016-05-27 — End: 2016-05-28
  Administered 2016-05-27: 5000 [IU] via SUBCUTANEOUS
  Filled 2016-05-27 (×2): qty 0.5

## 2016-05-27 MED ORDER — PROSTACYCLIN TUBING (~~LOC~~)
Status: DC
Start: 2016-05-27 — End: 2016-06-02
  Administered 2016-05-30: 14:00:00
  Filled 2016-05-27: qty 1

## 2016-05-27 MED ORDER — FUROSEMIDE 10 MG/ML IJ SOLN
80.0000 mg | Freq: Four times a day (QID) | INTRAMUSCULAR | Status: DC
Start: 2016-05-27 — End: 2016-06-01
  Administered 2016-05-27 – 2016-06-01 (×20): 80 mg via INTRAVENOUS
  Filled 2016-05-27 (×20): qty 8

## 2016-05-27 MED ORDER — PROSTACYCLIN ICE PACK (~~LOC~~)
Freq: Four times a day (QID) | Status: DC
Start: 2016-05-27 — End: 2016-06-02
  Administered 2016-05-27: 1
  Administered 2016-05-28
  Administered 2016-05-28: 1
  Administered 2016-05-28 – 2016-05-29 (×3): 2
  Administered 2016-05-29: 06:00:00
  Administered 2016-05-29: 2
  Administered 2016-05-29 – 2016-05-30 (×5)
  Administered 2016-05-31: 2
  Administered 2016-05-31: 18:00:00
  Administered 2016-05-31: 2
  Administered 2016-05-31 – 2016-06-01 (×2)
  Administered 2016-06-01: 2
  Administered 2016-06-01: 18:00:00
  Administered 2016-06-01: 2
  Administered 2016-06-02 (×2)
  Filled 2016-05-27 (×2): qty 1

## 2016-05-27 MED ORDER — LIDOCAINE HCL (PF) 2 % IJ SOLN
INTRAMUSCULAR | Status: AC
Start: 2016-05-27 — End: 2016-05-27
  Filled 2016-05-27: qty 10

## 2016-05-27 MED ORDER — PROSTACYCLIN BATTERIES - PUMP (~~LOC~~)
Status: DC
Start: 2016-05-27 — End: 2016-06-02
  Filled 2016-05-27: qty 1

## 2016-05-27 MED ORDER — HEPARIN (PORCINE) IN NACL 2-0.9 UNIT/ML-% IJ SOLN
INTRAMUSCULAR | Status: AC
Start: 2016-05-27 — End: 2016-05-27
  Filled 2016-05-27: qty 500

## 2016-05-27 MED ORDER — FEBUXOSTAT 40 MG PO TABS
40.0000 mg | ORAL_TABLET | Freq: Every day | ORAL | Status: DC
Start: 2016-05-28 — End: 2016-06-21
  Administered 2016-05-28 – 2016-06-21 (×25): 40 mg via ORAL
  Filled 2016-05-27 (×27): qty 1

## 2016-05-27 MED ORDER — PROSTACYCLIN CASSETTE CHANGE (~~LOC~~)
Freq: Every day | Status: DC
Start: 2016-05-27 — End: 2016-06-02
  Administered 2016-05-27 – 2016-06-01 (×6)
  Filled 2016-05-27: qty 1

## 2016-05-27 SURGICAL SUPPLY — 10 items
APPLICATOR CHLORAPREP 10.5ML (Kits/Sets/Trays) ×2
APPLICATOR CHLORAPREP 26ML, ~~LOC~~ (Misc Medical Supply) ×2 IMPLANT
CATHETER SWAN GANZ 7FR HEP. (Drains/Catheter/Tubes/Reservoir) ×2
COVER PROBE 10X249CM (Drape/Gowns/Gloves/Pack) ×2 IMPLANT
DRAPE TRANSRADIAL FEMORAL ANGIOGRAPHY - MEDLINE (Drape/Gowns/Gloves/Pack) ×2
ELECTRODE RED DOT REPOS CLOTH (Misc Medical Supply) ×2 IMPLANT
LEADWEAR SINGLE 2.0 SZ 73/5 LRG (Misc Medical Supply) ×2
PACK HEART MANIFOLD RT 2 PORT (Misc Medical Supply) ×2
SET PRECISION PINNACLE ACCESS 7FR ECHOGENIC NEEDLE SS WIRE 10CM SHEATH (Procedural wires/sheaths/catheters/balloons/dilators) ×2 IMPLANT
TRAY CARDIAC CATH CUSTOM (Kits/Sets/Trays) ×2 IMPLANT

## 2016-05-27 NOTE — Progress Notes (Signed)
Prostacyclin (Flolan/Veletri/Remodulin)      Prostacyclin MCP 333.1      Prostacyclin: Flolan - IV    Prostacyclin Dosing Weight: 86 kg     Dose: 29 ng/kg/min    Concentration:  45,000 ng/mL    Rate: Flolan/Veletri/Remodulin IV CADD:  80 mL/24hr    Last Changed: 5/15 @ 0700  76 mLs remaining at 1400  Changes every 1 days    Has 2 pumps in date?: yes  1). Serial # [id number] Due Date: 12/26/16  2). Serial # [id number] Due Date: 06/06/16  Pump Company: [place], [facility]

## 2016-05-27 NOTE — H&P (Signed)
H&P     Date: 05/27/16    Dictating practitioner: Lenna Sciara D. 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 The patient was recently hospitalized 05/02/2016 to 05/16/2016 for volume overload. She was aggressively diuresed and discharged on a regimen of bumex and spironolactone; she also remains on sildenafil and flolan. Since then the patient has been feeling fair. Denies recent illness, but dyspnea has been slightly worse, she has been using 3-4 L oxygen and usually only needs 2 L. She also notes increasing abdominal girth/tightness as well as leg edema. Estimates +3 lbs weight gain. She called the [facility] office on Friday and was prescribed metolazone; she took one dose with good response. She has occasional palpitations and "lung pain" with exertion. Denies dizziness, weakness, syncope, presyncope, or hemoptysis. She is accompanied today by her husband.     Review of Systems: As documented above, otherwise unremarkable    PMHx:  As above    Social History     Social History    Marital status: Married     Spouse name: N/A    Number of children: N/A    Years of education: N/A     Occupational History    Not on file.     Social History Main Topics    Smoking status: Former Smoker     Packs/day: 0.50     Years: 5.00     Quit date: 01/13/1978    Smokeless tobacco: Never Used    Alcohol use No    Drug use: Not on file    Sexual activity: Not on file     Other Topics Concern    Not on file     Social History Narrative       No current facility-administered medications on file prior to encounter.      Current Outpatient Prescriptions on File Prior to Encounter   Medication Sig Dispense Refill    azelastine (ASTELIN) 0.1 % nasal spray Spray 1 spray into each nostril 2 times daily. Use in  each nostril as directed 1 bottle 3    bumetanide (BUMEX) 2 MG tablet Take 1 tablet (2 mg) by mouth 3 times daily. 90 tablet 0    Cholecalciferol 2000 UNITS CAPS Take 1 capsule by mouth daily.      colchicine 0.6 MG tablet Take 1 tablet (0.6 mg) by mouth daily. 30 tablet 0    Estradiol (DIVIGEL) 0.25 MG/0.25GM GEL Apply 0.25 mg topically nightly.      estradiol (ESTRING) 2 MG vaginal ring Insert 1 Ring vaginally Every 3 months. Follow package directions      febuxostat (ULORIC) 40 MG TABS Take 1 tablet (40 mg) by mouth daily. 30 tablet 0    ferrous sulfate 324 (65 FE) MG TBEC Take 1 tablet (324 mg) by mouth daily. 30 tablet 0    FLOLAN 1.5 MG IV SOLR 91YN/WG/NFA      FOLIC ACID 213 MCG OR TABS 1 TABLET DAILY      medroxyPROGESTERone (PROVERA) 2.5 MG tablet Take 1.25 mg by mouth daily.      potassium chloride (KLOR-CON) 10 MEQ tablet Take 4 tablets (40 mEq) by mouth daily. 120 tablet 0    sildenafil (REVATIO) 20 MG tablet Take 4 tablets (80  mg) by mouth 3 times daily. 360 tablet 0    spironolactone (ALDACTONE) 50 MG tablet Take 1 tablet (50 mg) by mouth daily. 30 tablet 0     ALLERGIES:  1. ALLOPURINOL.  2. LEVAQUIN.  3. OFLOXACIN.  4. CHLORHEXIDINE.  5. SULFA DRUGS.    PHYSICAL EXAMINATION  VITAL SIGNS: Blood pressure 142/74, pulse 96, temperature 98.3 F (36.8 C), resp. rate 16, height 5\' 2"  (1.575 m), weight 76.9 kg (169 lb 8 oz), SpO2 95 %.    GENERAL: Awake, alert, oriented, no acute distress, able to speak  in full sentences.  OROPHARYNX: Clear.  NECK: Mild elevation of jugular venous distention. No  thyromegaly, no lymphadenopathy.  HEART: Regular rate and rhythm. Loud second heart sound. Positive  systolic murmur. No S3 noted.  LUNGS: Clear to auscultation bilaterally. No rales, no wheezes.  ABDOMEN: Slightly distended, nontender, positive bowel sounds.   EXTREMITIES: 1+ edema, bilateral lower extremities.    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 The patient presents today for Right Heart Catheterization due to further concern of volume overload s/p recent hospitalization. The risks and benefits of the procedure have been discussed with the patient, all questions have been answered and the patient agrees to proceed. Consents obtained and in the chart.       Dahlia Client, MS, APRN, ANP-BC, CNS, CCRN  Nurse Practitioner  Division of Pulmonary and Critical Care  Pager:  [phone]    Pulmonary Vascular Attending Addendum:  Patient was examined with Santiago Bur NP  Labs, imaging and tests reviewed.  Please see above note for full details and plan.    66 yo  F with wHO I PAH associated with CTD admitted following RHC with acute on chronic right heart failure and volume overload.  -Admit for diuresis  -Continue IV epo and increase dose by approx 10 ng/kg/min  -Continue sildenafil 80 mg TID  -Start macitentan 10 mg PO QDAY.      [name] MD  Pager [phone]

## 2016-05-27 NOTE — Op Note (Signed)
PROCEDURE PERFORMED: Diagnostic right heart catheterization without iNO challenge.  PREOPERATIVE DIAGNOSIS: Pulmonary Arterial Hypertension.  POSTOPERATIVE DIAGNOSIS: Pulmonary Arterial Hypertension.    STAFF: D. Poch, MD  ANESTHESIA: 5 mL of 2% lidocaine injected locally.  COMPLICATIONS: None.  ESTIMATED BLOOD LOSS: None.    PROCEDURE DESCRIPTION: Informed consent was obtained prior to the procedure. A time-out was performed prior to the procedure, confirming the patient's name, procedure type and procedure location. The patient was prepped and draped in the usual sterile fashion, exposing the right neck. The right internal jugular vein was visualized with real-time ultrasound guidance, and the skin above the right internal jugular was anesthetized with 10 mL of 2% lidocaine. The right jugular vein was then accessed with 1 stick without arterial puncture under direct real-time ultrasound guidance. Position in the venous system was confirmed under ultrasound, and a 7-French "Precision" micropuncture kit was used to place a 7-French lumen introducer sheath via modified Seldinger technique. A 7-French Swan-Ganz catheter was then advanced through the sheath into the right atrium, right ventricle, pulmonary artery and pulmonary artery occlusion pressure positions. Hemodynamic measurements and saturation measurements were obtained in the usual fashion. Following these measurements, the Swan-Ganz catheter was then removed, followed by removal of the sheath, and hemostasis was achieved. There were no complications from the procedure.    PROCEDURE FINDINGS:   05/27/16  0818   BP: 142/74   Pulse: 96   Resp: 16   Temp: 98.3 °F (36.8 °C)   SpO2: 95%         Procedure Findings:    Baseline  Units   RA [a/v (mean)] 19 mmHg   RV [Sys/Dias (RVEDP)] 93 (23) mmHg   PA [Sys/Dias (Mean)] 92/37 (62)  mmHg   PAOP [End Exp (Mean)]  18 (12) mmHg   Fick CO (CI) 4.74(2.67) L/min (L/min/m2)   TD CO (CI)  4.97(2.78) L/min (L/min/m2)   Fick  PVR  9.28 WU   TD PVR  8.85 WU   PA sat%  64% %   AO sat%  97% %       IMPRESSION/PLAN:   Worse hemodynamics compared to last RHC in 02/2016.  Severely elevated RAP.    -Admit for diuresis  -Continue IV epo and increase dose by approx 10 ng/kg/min  -Continue sildenafil 80 mg TID  -Start macitentan 10 mg PO QDAY.    David Poch M.D.  Pulmonary and Critical Care Attending  Pager [phone]

## 2016-05-27 NOTE — Plan of Care (Signed)
Flolan infusing at 25ml/24 hours infusing well thru pump via left subclavian central line. Tolerating well.

## 2016-05-28 LAB — CBC WITH DIFF, BLOOD
ANC-Automated: 2.3 10*3/uL (ref 1.6–7.0)
Abs Eosinophils: 0.1 10*3/uL (ref 0.1–0.5)
Abs Lymphs: 1 10*3/uL (ref 0.8–3.1)
Abs Monos: 0.3 10*3/uL (ref 0.2–0.8)
Eosinophils: 1 %
Hct: 33.2 % — ABNORMAL LOW (ref 34.0–45.0)
Hgb: 10.5 gm/dL — ABNORMAL LOW (ref 11.2–15.7)
Imm Gran %: 1 % (ref <1)
Lymphocytes: 27 %
MCH: 25.5 pg — ABNORMAL LOW (ref 26.0–32.0)
MCHC: 31.6 g/dL — ABNORMAL LOW (ref 32.0–36.0)
MCV: 80.6 um3 (ref 79.0–95.0)
Monocytes: 8 %
Plt Count: 84 10*3/uL — ABNORMAL LOW (ref 140–370)
RBC: 4.12 10*6/uL (ref 3.90–5.20)
RDW: 18.6 % — ABNORMAL HIGH (ref 12.0–14.0)
Segs: 62 %
WBC: 3.7 10*3/uL — ABNORMAL LOW (ref 4.0–10.0)

## 2016-05-28 LAB — BASIC METABOLIC PANEL, BLOOD
Anion Gap: 11 mmol/L (ref 7–15)
BUN: 53 mg/dL — ABNORMAL HIGH (ref 8–23)
Bicarbonate: 26 mmol/L (ref 22–29)
Calcium: 9.7 mg/dL (ref 8.5–10.6)
Chloride: 101 mmol/L (ref 98–107)
Creatinine: 1.31 mg/dL — ABNORMAL HIGH (ref 0.51–0.95)
GFR: 41 mL/min
Glucose: 85 mg/dL (ref 70–99)
Potassium: 3.9 mmol/L (ref 3.5–5.1)
Sodium: 138 mmol/L (ref 136–145)

## 2016-05-28 LAB — MAGNESIUM, BLOOD: Magnesium: 2.3 mg/dL (ref 1.6–2.4)

## 2016-05-28 LAB — PHOSPHORUS, BLOOD: Phosphorous: 3.4 mg/dL (ref 2.7–4.5)

## 2016-05-28 MED ORDER — CHLOROTHIAZIDE SODIUM 500 MG IV SOLR
250.0000 mg | Freq: Two times a day (BID) | INTRAVENOUS | Status: DC
Start: 2016-05-28 — End: 2016-05-31
  Administered 2016-05-28 – 2016-05-31 (×6): 250 mg via INTRAVENOUS
  Filled 2016-05-28 (×7): qty 9

## 2016-05-28 MED ORDER — CHLOROTHIAZIDE SODIUM 500 MG IV SOLR
250.00 mg | Freq: Once | INTRAVENOUS | Status: AC
Start: 2016-05-28 — End: 2016-05-28
  Administered 2016-05-28: 250 mg via INTRAVENOUS
  Filled 2016-05-28: qty 9

## 2016-05-28 MED ORDER — DOCUSATE SODIUM 250 MG OR CAPS
250.0000 mg | ORAL_CAPSULE | Freq: Every day | ORAL | Status: DC
Start: 2016-05-28 — End: 2016-06-21
  Administered 2016-05-28 – 2016-06-21 (×25): 250 mg via ORAL
  Filled 2016-05-28 (×26): qty 1

## 2016-05-28 MED ORDER — ENOXAPARIN SODIUM 30 MG/0.3ML SC SOLN
30.0000 mg | Freq: Every day | SUBCUTANEOUS | Status: DC
Start: 2016-05-29 — End: 2016-05-28

## 2016-05-28 MED ORDER — ENOXAPARIN SODIUM 40 MG/0.4ML SC SOLN
40.0000 mg | Freq: Every day | SUBCUTANEOUS | Status: DC
Start: 2016-05-29 — End: 2016-06-20
  Administered 2016-05-29 – 2016-06-20 (×23): 40 mg via SUBCUTANEOUS
  Filled 2016-05-28 (×23): qty 1

## 2016-05-28 NOTE — Plan of Care (Signed)
Problem: Fluid Volume Excess  Goal: Absence of edema; peripheral and/or pulmonary  Outcome: Progressing toward goal, anticipate improvement over: >48 hours  BLE edema and abdominal distention noted.  Receiving diuretics as ordered.     Problem: Breathing Pattern - Ineffective  Goal: Respiratory rate, rhythm and depth return to baseline  Outcome: Goal Met  Monitored breathing and lung sounds. Patient instructed to report difficulty breathing/shortness of breath. Patient reports dyspnea with exertion. See assessment for pulmonary details.  No acute respiratory distress noted.      Monitored Flolan administration. Additional CADD pump at bedside at all times. Flolan infusing without interruption to right subclavian Hickman. See MAR for Flolan concetration and rate of infusion.  Flushing noted to skin but no other side effects noted at this time.

## 2016-05-28 NOTE — Plan of Care (Signed)
Problem: Fluid Volume Excess  Goal: Absence of edema; peripheral and/or pulmonary  Outcome: Progressing toward goal, anticipate improvement over: Next 24-48 hours  Pt admitted for fluid overload and currently on IV diurel BID and IV lasix q6 hrs.  Pt diuresing adequate amount.  Abdominal swelling improving and pt has + 2 non-pitting edema to ankles/feet.  Pt reminded to keep legs elevated while supine/in bed.      Problem: Breathing Pattern - Ineffective  Goal: Respiratory rate, rhythm and depth return to baseline  Outcome: Progressing toward goal, anticipate improvement over: Next 24-48 hours  Pt on 3 L NC with 02 sats 93-95% and have not had to titrate up on 02.  Pt dyspneic with exertion but denies at rest, breathing even with no  s/o respiratory distress.  Will continue to monitor.

## 2016-05-28 NOTE — Progress Notes (Signed)
PH Progress Note    Date of Admission: 05/27/2016  CC:  Admitted after [place] 5/15    Interval/Subjective:  Feels well  Abd less distended  Appetite is improving  No side effects from epo up-titration this am.    Objective:  Current Facility-Administered Medications   Medication    acetaminophen (TYLENOL) tablet 650 mg    azelastine (ASTELIN) 0.1 % nasal spray 1 spray    chlorothiazide (DIURIL) injection 250 mg    colchicine tablet 0.6 mg    docusate sodium (COLACE) capsule 250 mg    [START ON 05/29/2016] enoxaparin (LOVENOX) injection 40 mg    epoprostenol (FLOLAN) 45,000 ng/mL in glycine diluent 100 mL infusion    febuxostat (ULORIC) tablet 40 mg    ferrous sulfate EC tablet 400 mg    folic acid (FOLVITE) tablet 0.5 mg    furosemide (LASIX) injection 80 mg    macitentan (OPSUMIT) tablet 10 mg    medroxyPROGESTERone (PROVERA) tablet 1.25 mg    nalOXone (NARCAN) injection 0.1 mg    potassium chloride (KLOR-CON) ER tablet 40 mEq    Prostacyclin Cassette Change    Prostacyclin Ice Pack    Prostacyclin Tubing    Prostacylin Batteries    sildenafil (REVATIO, VIAGRA) tablet 80 mg    sodium chloride (PF) 0.9 % flush 3 mL    sodium chloride (PF) 0.9 % flush 3 mL    sodium chloride 0.9 % TKO infusion    spironolactone (ALDACTONE) tablet 50 mg       VS:  Temperature:  [97.8 F (36.6 C)-98.2 F (36.8 C)] 97.8 F (36.6 C) (05/16 1600)  Blood pressure (BP): (119-141)/(58-78) 135/75 (05/16 1600)  Heart Rate:  [94-102] 94 (05/16 1600)  Pain Score: 0 (05/16 1600)  O2 Device: Nasal cannula (05/16 1600)  O2 Flow Rate (L/min):  [3 l/min] 3 l/min (05/16 1600)  SpO2:  [92 %-97 %] 92 % (05/16 1600)    Intake/Output Summary (Last 24 hours) at 05/28/16 1813  -1300cc fluid balance yesterday    Exam:  Awake, NAD, no respiratory distress  Fully oriented  MMM  Mild JVD  RRR, P2  Rales L base, clr with cough  Abd distended, fluid wave, no guarding, + bowel sounds  Pitting LE edema  Skin flushed    Results reviewed,  pertinent findings:  Lab Results   Component Value Date    WBC 3.7 (L) 05/28/2016    RBC 4.12 05/28/2016    HGB 10.5 (L) 05/28/2016    HCT 33.2 (L) 05/28/2016    MCV 80.6 05/28/2016    MCHC 31.6 (L) 05/28/2016    RDW 18.6 (H) 05/28/2016    PLT 84 (L) 05/28/2016    PLT 179 09/12/2008    MPV 9.7 05/12/2016     Lab Results   Component Value Date    NA 138 05/28/2016    K 3.9 05/28/2016    CL 101 05/28/2016    BICARB 26 05/28/2016    BUN 53 (H) 05/28/2016    CREAT 1.31 (H) 05/28/2016    GLU 85 05/28/2016    [place] 9.7 05/28/2016     Lab Results   Component Value Date    AST 18 05/27/2016    ALT 13 05/27/2016    ALK 169 (H) 05/27/2016    TP 7.5 05/27/2016    ALB 3.5 05/27/2016    TBILI 0.53 05/27/2016    DBILI 0.1 06/06/2011     RHC:      Assessment  and Plan:  Jamesina Gaugh is a pleasant 66 year old female with SLE, chronic hypoxemic respiratory failure, and 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  Admitted after [place] above for diuresis and titration of her PAH medication.      Furosemide + chlorthiazide  Continue spironolactone  Flolan up to 30, goal to increase aprox 10 ng/kg/min  Continue maci and sildenafil  Uloric and colchicine for her gout  Monitor and replete electrolytes as needed  Low Na diet  LMWH for DVT prophylaxis  Full Code/Care  Med surg    Discussed with Dr [name].    Ocie Doyne, M.D.  Fellow  Department of Pulmonary and Critical Care Medicine  [phone]    Pulmonary Vascular Attending Addendum:  Patient was examined with Dr. Sunday Corn  Labs, imaging and tests reviewed.  Please see above note for full details and plan.    66 yo  F with WHO I PAH associated with CTD admitted following RHC 05/27/16 with acute on chronic right heart failure and volume overload.  -Continue IV lasix and add IV diuril for net negative 2 L/day  -Follow daily weights  -Increase IV epo to 30 ng/kg/min - goal approx 40 ng.  -Continue sildenafil 80 mg TID  -Continue macitentan 10 mg PO QDAY (new start this admission).  -Continue  remainder of medications for gout  -LMWH for DVT prophylaxis  -Follow thrombocytopenia    [name] MD  Pager [phone]

## 2016-05-28 NOTE — Plan of Care (Signed)
Problem: Fluid Volume Excess  Goal: Absence of edema; peripheral and/or pulmonary  Outcome: Progressing toward goal, anticipate improvement over: >48 hours  Patient's shortness of breath worse, verbalized increasing abdominal girth. Crackles at the bases and BLE edema +2 noted.  Patient on lasix IVP TID and aldactone. Will monitor Intake and output.  Goal ongoing.    Problem: Breathing Pattern - Ineffective  Goal: Respiratory rate, rhythm and depth return to baseline  Outcome: Progressing toward goal, anticipate improvement over: >48 hours  Patient dyspneic on exertion. No cough noted.  On 3L/NC saturating 96-97%. Encouraged deep breathing exercises. Flolan infusing.  Dose/rate verified with another Therapist, sports.  Plan to increase flolan dose according to patient.

## 2016-05-28 NOTE — Interdisciplinary (Signed)
05/28/16 0951   Patient Information   Where was the patient admitted from? home   Why is Patient in the Hospital? [place], Hx of [place]   Prior to Level of Function Ambulatory/Independent with ADL's   Primary Caretaker(s) Spouse   Primary Contact Name Husband Virgina Organ Contact Number [phone]   Primary Contact Relationship Spouse   Permission to Contact Yes   Status/Conservatorship [place] aid   [employer] [place] Affiliation No   Referral To   Financial Resources Medicare   Discharge Planning   Living Arrangements Spouse / significant other   Support Systems Family member(s)   Type of Residence One story home   [place] Yes   Anticipated Supplies/Services  Home health;IV infusion/antibiotics;Oxygen   Barriers to Discharge Awaiting clinical improvement   Has discharge transport been arranged? Yes   Equities trader in Discharge Planning Yes   Patient Has Decision Making Capacity Yes   Patient/Family/Legal/Surrogate Decision Maker Has Been Given Options And Choice In The Selection of Post-Acute Care Providers Yes   Patient/Family Are In Agreement With Discharge Plan yes   Public Health Clearance Needed No   Plan/Interventions   Plan/Interventions Explore needs and options for aftercare, provide referrals   Social Worker Consult   Do you need to see a Education officer, museum?  Yes   Issues to discuss with social worker?  Adjustment to illness/injury   Readmission Risk Assessment   Readmission Within 30 Days of Discharge Yes   Admission Was Unplanned   Patient Explanation  Got sicker   Family Explanation Got sicker   Recent Hospitalizations (Within Last 6 Months) Yes   High Risk For Readmission Yes   High Risk Indicators Multiple diagnoses and comorbidities;Anticipated long term health care needs (e.g. new diabetic, CHF, Stroke, MI, etc.);Chronic illness   Action Taken To Prevent Readmission After  Discharge PT OT ST evaluation and recommendation;Home health referral;Dietary recommendations   Recommendations to the Physician Home health orders;Durable medical equipment orders   Initial Assessment Completed   CM Initial Assessment Completed   Met with patient at the bedside. She lives in [place] with her husband, [name], in a house, no stairs. She is able to ambulate independently. Pt uses home O2, her O2  requirements increased from2l/m to 3/4l/m. She has an [employer] pump with her. Pt has been on home Flolan Gtt for several years, receives her medications from [facility]. She is very knowledgeable about her condition and is able to mix her medications and manage pump at home. She was recently dc home, few weeks ago, readmitted for worsening of her condition. Supportive husband at the bs. CM to continue to f/u for any dc needs.

## 2016-05-29 ENCOUNTER — Inpatient Hospital Stay (HOSPITAL_COMMUNITY): Payer: Medicare Other

## 2016-05-29 DIAGNOSIS — K8020 Calculus of gallbladder without cholecystitis without obstruction: Secondary | ICD-10-CM

## 2016-05-29 DIAGNOSIS — R188 Other ascites: Secondary | ICD-10-CM

## 2016-05-29 DIAGNOSIS — R162 Hepatomegaly with splenomegaly, not elsewhere classified: Secondary | ICD-10-CM

## 2016-05-29 DIAGNOSIS — Z8679 Personal history of other diseases of the circulatory system: Secondary | ICD-10-CM

## 2016-05-29 LAB — CBC WITH DIFF, BLOOD
ANC-Automated: 2.5 10*3/uL (ref 1.6–7.0)
Abs Eosinophils: 0.1 10*3/uL (ref 0.1–0.5)
Abs Lymphs: 1.1 10*3/uL (ref 0.8–3.1)
Abs Monos: 0.4 10*3/uL (ref 0.2–0.8)
Eosinophils: 2 %
Hct: 34.3 % (ref 34.0–45.0)
Hgb: 10.3 gm/dL — ABNORMAL LOW (ref 11.2–15.7)
Imm Gran %: 2 % — ABNORMAL HIGH (ref <1)
Imm Gran Abs: 0.1 10*3/uL (ref <0.1)
Lymphocytes: 26 %
MCH: 24.2 pg — ABNORMAL LOW (ref 26.0–32.0)
MCHC: 30 g/dL — ABNORMAL LOW (ref 32.0–36.0)
MCV: 80.7 um3 (ref 79.0–95.0)
MPV: 10.2 fL (ref 9.4–12.4)
Monocytes: 9 %
Plt Count: 85 10*3/uL — ABNORMAL LOW (ref 140–370)
RBC: 4.25 10*6/uL (ref 3.90–5.20)
RDW: 18.6 % — ABNORMAL HIGH (ref 12.0–14.0)
Segs: 61 %
WBC: 4 10*3/uL (ref 4.0–10.0)

## 2016-05-29 LAB — BASIC METABOLIC PANEL, BLOOD
Anion Gap: 12 mmol/L (ref 7–15)
BUN: 53 mg/dL — ABNORMAL HIGH (ref 8–23)
Bicarbonate: 30 mmol/L — ABNORMAL HIGH (ref 22–29)
Calcium: 9.7 mg/dL (ref 8.5–10.6)
Chloride: 97 mmol/L — ABNORMAL LOW (ref 98–107)
Creatinine: 1.29 mg/dL — ABNORMAL HIGH (ref 0.51–0.95)
GFR: 41 mL/min
Glucose: 107 mg/dL — ABNORMAL HIGH (ref 70–99)
Potassium: 3 mmol/L — ABNORMAL LOW (ref 3.5–5.1)
Sodium: 139 mmol/L (ref 136–145)

## 2016-05-29 LAB — PHOSPHORUS, BLOOD: Phosphorous: 3.6 mg/dL (ref 2.7–4.5)

## 2016-05-29 LAB — MAGNESIUM, BLOOD: Magnesium: 2.2 mg/dL (ref 1.6–2.4)

## 2016-05-29 LAB — LAB MISC TEST

## 2016-05-29 MED ORDER — POTASSIUM CHLORIDE CRYS CR 10 MEQ OR TBCR
40.00 meq | EXTENDED_RELEASE_TABLET | Freq: Once | ORAL | Status: AC
Start: 2016-05-29 — End: 2016-05-29
  Administered 2016-05-29: 40 meq via ORAL
  Filled 2016-05-29: qty 4

## 2016-05-29 NOTE — Plan of Care (Signed)
Problem: Fluid Volume Excess  Goal: Absence of edema; peripheral and/or pulmonary  Outcome: Progressing toward goal, anticipate improvement over: next 12-24 hours      Problem: Breathing Pattern - Ineffective  Goal: Respiratory rate, rhythm and depth return to baseline  Outcome: Progressing toward goal, anticipate improvement over: next 12-24 hours  Monitored breathing and lungsounds. Advised patient to call and report any difficulty breathing/shortness of breath. Monitor oxygen saturations. Assessed patient for signs or symptoms of hypoxia.   Patient has reported no dyspnea at rest so far this shift.  Patioent with slight DOE. Patient verbalizes understanding of role in reporting increasing dyspnea. See documented oxygen saturation with vitals.See assessment forpulmonary details. Patient has remained comfortable and without acute respiratory distress during shift. Continue to monitor patient, intervene as necessary, and update care plan as needed during shift.

## 2016-05-29 NOTE — Progress Notes (Signed)
[facility] Progress Note    Date of Admission: 05/27/2016  CC:  Admitted after [place] 5/15    Interval/Subjective:  Doing well  Diuresing  Abd distension has improved somewhat  Able to eat without discomfort    Objective:  Current Facility-Administered Medications   Medication    acetaminophen (TYLENOL) tablet 650 mg    azelastine (ASTELIN) 0.1 % nasal spray 1 spray    chlorothiazide (DIURIL) injection 250 mg    colchicine tablet 0.6 mg    docusate sodium (COLACE) capsule 250 mg    enoxaparin (LOVENOX) injection 40 mg    epoprostenol (FLOLAN) 45,000 ng/mL in glycine diluent 100 mL infusion    febuxostat (ULORIC) tablet 40 mg    ferrous sulfate EC tablet 324 mg    folic acid (FOLVITE) tablet 0.5 mg    furosemide (LASIX) injection 80 mg    macitentan (OPSUMIT) tablet 10 mg    medroxyPROGESTERone (PROVERA) tablet 1.25 mg    nalOXone (NARCAN) injection 0.1 mg    potassium chloride (KLOR-CON) ER tablet 40 mEq    Prostacyclin Cassette Change    Prostacyclin Ice Pack    Prostacyclin Tubing    Prostacylin Batteries    sildenafil (REVATIO, VIAGRA) tablet 80 mg    sodium chloride (PF) 0.9 % flush 3 mL    sodium chloride (PF) 0.9 % flush 3 mL    sodium chloride 0.9 % TKO infusion    spironolactone (ALDACTONE) tablet 50 mg     VS:  Temperature:  [97.1 F (36.2 C)-99.1 F (37.3 C)] 98 F (36.7 C) (05/17 1145)  Blood pressure (BP): (110-139)/(53-67) 115/54 (05/17 1145)  Heart Rate:  [90-100] 95 (05/17 1145)  Respirations:  [16-24] 18 (05/17 1145)  Pain Score: 0 (05/17 1145)  O2 Device: Nasal cannula (05/17 1145)  O2 Flow Rate (L/min):  [3 l/min] 3 l/min (05/17 1145)  SpO2:  [92 %-95 %] 93 % (05/17 1145)    Intake/Output Summary (Last 24 hours) at 05/29/16 1637  -2.2 L    Exam:  Awake, NAD, no respiratory distress  Fully oriented  MMM  Mild JVD  RRR, P2  Occasional rales  Abd distended, fluid wave, no guarding, + bowel sounds  Pitting LE edema  Skin flushed    Results reviewed, pertinent findings:  Lab Results      Component Value Date    WBC 4.0 05/29/2016    RBC 4.25 05/29/2016    HGB 10.3 (L) 05/29/2016    HCT 34.3 05/29/2016    MCV 80.7 05/29/2016    MCHC 30.0 (L) 05/29/2016    RDW 18.6 (H) 05/29/2016    PLT 85 (L) 05/29/2016    PLT 179 09/12/2008    MPV 10.2 05/29/2016     Lab Results   Component Value Date    NA 139 05/29/2016    K 3.0 (L) 05/29/2016    CL 97 (L) 05/29/2016    BICARB 30 (H) 05/29/2016    BUN 53 (H) 05/29/2016    CREAT 1.29 (H) 05/29/2016    GLU 107 (H) 05/29/2016    [place] 9.7 05/29/2016     Lab Results   Component Value Date    AST 18 05/27/2016    ALT 13 05/27/2016    ALK 169 (H) 05/27/2016    TP 7.5 05/27/2016    ALB 3.5 05/27/2016    TBILI 0.53 05/27/2016    DBILI 0.1 06/06/2011     RHC:      Assessment and Plan:  Diana Chambers is a pleasant 66 year old female with SLE, chronic hypoxemic respiratory failure, and 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  Admitted after RHC above for diuresis and titration of her PAH medication.       Furosemide + chlorthiazide  Continue spironolactone  Flolan up to 31, goal to increase aprox 10 ng/kg/min  Continue maci and sildenafil  Uloric and colchicine for her gout  Monitor and replete electrolytes as needed  Low Na diet  LMWH for DVT prophylaxis  Full Code/Care  Med surg     Discussed with Dr Poch.     Matthew Light, M.D.  Fellow  Department of Pulmonary and Critical Care Medicine  [phone]    Pulmonary Vascular Attending Addendum:  Patient was examined with Dr. Light  Labs, imaging and tests reviewed.  Please see above note for full details and plan.      66 yo  F with WHO I PAH associated with CTD admitted following RHC 05/27/16 with acute on chronic right heart failure and volume overload.  -Continue IV lasix and add IV diuril for net negative 2 L/day  -Follow daily weights  -Increase IV epo to 31 ng/kg/min - goal approx 40 ng.  -Continue sildenafil 80 mg TID  -Continue macitentan 10 mg PO QDAY (new start this admission).  -Continue remainder of  medications for gout  -LMWH for DVT prophylaxis  -Follow thrombocytopenia     David Poch MD  Pager [phone]

## 2016-05-30 LAB — BASIC METABOLIC PANEL, BLOOD
Anion Gap: 13 mmol/L (ref 7–15)
Anion Gap: 10 mmol/L (ref 7–15)
BUN: 58 mg/dL — ABNORMAL HIGH (ref 8–23)
BUN: 60 mg/dL — ABNORMAL HIGH (ref 8–23)
Bicarbonate: 30 mmol/L — ABNORMAL HIGH (ref 22–29)
Bicarbonate: 31 mmol/L — ABNORMAL HIGH (ref 22–29)
Calcium: 9.8 mg/dL (ref 8.5–10.6)
Calcium: 9.9 mg/dL (ref 8.5–10.6)
Chloride: 97 mmol/L — ABNORMAL LOW (ref 98–107)
Chloride: 97 mmol/L — ABNORMAL LOW (ref 98–107)
Creatinine: 1.45 mg/dL — ABNORMAL HIGH (ref 0.51–0.95)
Creatinine: 1.4 mg/dL — ABNORMAL HIGH (ref 0.51–0.95)
GFR: 36 mL/min
GFR: 38 mL/min
Glucose: 131 mg/dL — ABNORMAL HIGH (ref 70–99)
Glucose: 92 mg/dL (ref 70–99)
Potassium: 2.9 mmol/L — ABNORMAL LOW (ref 3.5–5.1)
Potassium: 4.3 mmol/L (ref 3.5–5.1)
Sodium: 140 mmol/L (ref 136–145)
Sodium: 138 mmol/L (ref 136–145)

## 2016-05-30 LAB — CBC WITH DIFF, BLOOD
ANC-Automated: 1.8 10*3/uL (ref 1.6–7.0)
Abs Eosinophils: 0.1 10*3/uL (ref 0.1–0.5)
Abs Lymphs: 1.2 10*3/uL (ref 0.8–3.1)
Abs Monos: 0.3 10*3/uL (ref 0.2–0.8)
Basophils: 1 %
Eosinophils: 1 %
Hct: 33.3 % — ABNORMAL LOW (ref 34.0–45.0)
Hgb: 10.3 gm/dL — ABNORMAL LOW (ref 11.2–15.7)
Imm Gran %: 1 % (ref <1)
Lymphocytes: 36 %
MCH: 24.5 pg — ABNORMAL LOW (ref 26.0–32.0)
MCHC: 30.9 g/dL — ABNORMAL LOW (ref 32.0–36.0)
MCV: 79.3 um3 (ref 79.0–95.0)
MPV: 9.5 fL (ref 9.4–12.4)
Monocytes: 8 %
Plt Count: 74 10*3/uL — ABNORMAL LOW (ref 140–370)
RBC: 4.2 10*6/uL (ref 3.90–5.20)
RDW: 18.6 % — ABNORMAL HIGH (ref 12.0–14.0)
Segs: 53 %
WBC: 3.5 10*3/uL — ABNORMAL LOW (ref 4.0–10.0)

## 2016-05-30 LAB — MAGNESIUM, BLOOD: Magnesium: 2.4 mg/dL (ref 1.6–2.4)

## 2016-05-30 LAB — PHOSPHORUS, BLOOD: Phosphorous: 3.5 mg/dL (ref 2.7–4.5)

## 2016-05-30 MED ORDER — POTASSIUM CHLORIDE CRYS CR 10 MEQ OR TBCR
40.00 meq | EXTENDED_RELEASE_TABLET | ORAL | Status: AC
Start: 2016-05-30 — End: 2016-05-30
  Administered 2016-05-30 (×2): 40 meq via ORAL
  Filled 2016-05-30 (×2): qty 4

## 2016-05-30 MED ORDER — POTASSIUM CHLORIDE CRYS CR 10 MEQ OR TBCR
40.00 meq | EXTENDED_RELEASE_TABLET | Freq: Once | ORAL | Status: AC
Start: 2016-05-30 — End: 2016-05-30
  Administered 2016-05-30: 40 meq via ORAL
  Filled 2016-05-30: qty 4

## 2016-05-30 NOTE — Plan of Care (Signed)
Problem: Fluid Volume Excess  Goal: Absence of edema; peripheral and/or pulmonary  Outcome: Progressing toward goal, anticipate improvement over: next 12-24 hours  Pt has 2+ pitting edema to bilateral lower extremities. Pt receiving IVP lasix and diuril. Pt tolerating. Daily weight being monitored.    Problem: Breathing Pattern - Ineffective  Goal: Respiratory rate, rhythm and depth return to baseline  Outcome: Progressing toward goal, anticipate improvement over: next 12-24 hours  Respiratory pattern even, regular, unlabored. Pt denies SOB, chest pain, or cough.

## 2016-05-30 NOTE — Progress Notes (Signed)
[place] Progress Note    Date of Admission: 05/27/2016  CC: Admitted after [place] 5/15    Interval/Subjective:  HAs this am after flolan increased, transient, none this afternoon.    Husband reports patient is tired and napping.    UOP has been good.    No muscle cramping.    S/p 160 mEq of KCl today.    Objective:  Current Facility-Administered Medications   Medication    acetaminophen (TYLENOL) tablet 650 mg    azelastine (ASTELIN) 0.1 % nasal spray 1 spray    chlorothiazide (DIURIL) injection 250 mg    colchicine tablet 0.6 mg    docusate sodium (COLACE) capsule 250 mg    enoxaparin (LOVENOX) injection 40 mg    epoprostenol (FLOLAN) 45,000 ng/mL in glycine diluent 100 mL infusion    febuxostat (ULORIC) tablet 40 mg    ferrous sulfate EC tablet 784 mg    folic acid (FOLVITE) tablet 0.5 mg    furosemide (LASIX) injection 80 mg    macitentan (OPSUMIT) tablet 10 mg    medroxyPROGESTERone (PROVERA) tablet 1.25 mg    nalOXone (NARCAN) injection 0.1 mg    potassium chloride (KLOR-CON) ER tablet 40 mEq    Prostacyclin Cassette Change    Prostacyclin Ice Pack    Prostacyclin Tubing    Prostacylin Batteries    sildenafil (REVATIO, VIAGRA) tablet 80 mg    sodium chloride (PF) 0.9 % flush 3 mL    sodium chloride (PF) 0.9 % flush 3 mL    sodium chloride 0.9 % TKO infusion    spironolactone (ALDACTONE) tablet 50 mg     VS:  Temperature:  [97.8 F (36.6 C)-98.8 F (37.1 C)] 97.9 F (36.6 C) (05/18 1510)  Blood pressure (BP): (110-139)/(56-64) 125/62 (05/18 1510)  Heart Rate:  [89-101] 99 (05/18 1510)  Respirations:  [18-20] 18 (05/18 1510)  Pain Score: 0 (05/18 1510)  O2 Device: Nasal cannula (05/18 1510)  O2 Flow Rate (L/min):  [3 l/min] 3 l/min (05/18 1510)  SpO2:  [94 %-100 %] 96 % (05/18 1510)        Intake/Output Summary (Last 24 hours) at 05/30/16 1706  -2.7 L fluid balance    Exam:  Sleeping comfortably, NAD, husband at bedside  Skin flushed    Results reviewed, pertinent findings:  Lab Results      Component Value Date    WBC 3.5 (L) 05/30/2016    RBC 4.20 05/30/2016    HGB 10.3 (L) 05/30/2016    HCT 33.3 (L) 05/30/2016    MCV 79.3 05/30/2016    MCHC 30.9 (L) 05/30/2016    RDW 18.6 (H) 05/30/2016    PLT 74 (L) 05/30/2016    PLT 179 09/12/2008    MPV 9.5 05/30/2016     Lab Results   Component Value Date    NA 140 05/30/2016    K 2.9 (L) 05/30/2016    CL 97 (L) 05/30/2016    BICARB 30 (H) 05/30/2016    BUN 58 (H) 05/30/2016    CREAT 1.45 (H) 05/30/2016    GLU 131 (H) 05/30/2016    [place] 9.8 05/30/2016     Lab Results   Component Value Date    AST 18 05/27/2016    ALT 13 05/27/2016    ALK 169 (H) 05/27/2016    TP 7.5 05/27/2016    ALB 3.5 05/27/2016    TBILI 0.53 05/27/2016    DBILI 0.1 06/06/2011       RHC:  Assessment and Plan:  Diana Chambers is a pleasant 66 year oldfemale with SLE, chronic hypoxemic respiratory failure, and 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 Admitted after [place] above for diuresis and titration of her PAH medication.     Diuresing well, achieving goal negative fluid balance, tolerating higher epoprostenol doses.  Iatrogenic hypokalemia.      Continue furosemide/chlorthiazide combination.  Follow I/Os and weights.  Continue spironolactone.  Flolan up to 32, goal to increase aprox 10 ng/kg/min from admission dose of 30.  Needs bid potassium checked and repletion as needed, next this evening.    Continue maci and sildenafil.  Uloric and colchicine gout.  Low Na diet.  LMWH for DVT prophylaxis.  Full Code/Care.  Med surg.    Discussed with Dr [name].    Ocie Doyne, M.D.  Fellow  Department of Pulmonary and Critical Care Medicine  [phone]    Pulmonary Vascular Attending Addendum:  Patient was examined with Dr. Sunday Corn  Labs, imaging and tests reviewed.  Please see above note for full details and plan.    66 yo F with WHO I PAH associated with CTD admitted following RHC 5/15/18with acute on chronic right heart failure and volume overload.  -Continue IV lasix and add IV  diuril for net negative 2 L/day  -Follow daily weights  -IncreaseIV epo to 32 ng/kg/min - goal approx 40 ng.  -Continue sildenafil 80 mg TID  -Continue macitentan 10 mg PO QDAY (new start this admission).  -Continue remainder of medications for gout  -LMWH for DVT prophylaxis  -Follow thrombocytopenia    [name] MD  Pager [phone]

## 2016-05-30 NOTE — Plan of Care (Signed)
Problem: Fluid Volume Excess  Goal: Absence of edema; peripheral and/or pulmonary  Outcome: Progressing toward goal, anticipate improvement over: Next 24-48 hours  Pt remains on on IV diurel BID and IV lasix q6 hrs due to not meeting diuresis goals yet.  Pt diuresing adequate amount.  Abdominal swelling improving and pt has + 2 non-pitting edema to ankles/feet.  Pt reminded to keep legs elevated while supine/in bed.  Mild dyspnea with exertion but improving.      Problem: Breathing Pattern - Ineffective  Goal: Respiratory rate, rhythm and depth return to baseline  Outcome: Progressing toward goal, anticipate improvement over: Next 24-48 hours  Pt on 3 L NC with 02 sats 93-95% and have not had to titrate up on 02.  Pt dyspneic with exertion but denies at rest, breathing even with no  s/o respiratory distress.  Will continue to monitor.

## 2016-05-31 LAB — BASIC METABOLIC PANEL, BLOOD
Anion Gap: 12 mmol/L (ref 7–15)
Anion Gap: 13 mmol/L (ref 7–15)
BUN: 62 mg/dL — ABNORMAL HIGH (ref 8–23)
BUN: 65 mg/dL — ABNORMAL HIGH (ref 8–23)
Bicarbonate: 32 mmol/L — ABNORMAL HIGH (ref 22–29)
Bicarbonate: 31 mmol/L — ABNORMAL HIGH (ref 22–29)
Calcium: 10.4 mg/dL (ref 8.5–10.6)
Calcium: 10 mg/dL (ref 8.5–10.6)
Chloride: 93 mmol/L — ABNORMAL LOW (ref 98–107)
Chloride: 93 mmol/L — ABNORMAL LOW (ref 98–107)
Creatinine: 1.44 mg/dL — ABNORMAL HIGH (ref 0.51–0.95)
Creatinine: 1.43 mg/dL — ABNORMAL HIGH (ref 0.51–0.95)
GFR: 36 mL/min
GFR: 37 mL/min
Glucose: 90 mg/dL (ref 70–99)
Glucose: 116 mg/dL — ABNORMAL HIGH (ref 70–99)
Potassium: 3 mmol/L — ABNORMAL LOW (ref 3.5–5.1)
Potassium: 3.4 mmol/L — ABNORMAL LOW (ref 3.5–5.1)
Sodium: 137 mmol/L (ref 136–145)
Sodium: 137 mmol/L (ref 136–145)

## 2016-05-31 MED ORDER — POTASSIUM CHLORIDE CRYS CR 10 MEQ OR TBCR
40.00 meq | EXTENDED_RELEASE_TABLET | Freq: Once | ORAL | Status: AC
Start: 2016-05-31 — End: 2016-05-31
  Administered 2016-05-31: 40 meq via ORAL
  Filled 2016-05-31: qty 4

## 2016-05-31 MED ORDER — POTASSIUM CHLORIDE CRYS CR 10 MEQ OR TBCR
40.00 meq | EXTENDED_RELEASE_TABLET | ORAL | Status: AC
Start: 2016-05-31 — End: 2016-05-31
  Administered 2016-05-31 (×2): 40 meq via ORAL
  Filled 2016-05-31 (×2): qty 4

## 2016-05-31 NOTE — Plan of Care (Addendum)
Problem: Fluid Volume Excess  Goal: Absence of edema; peripheral and/or pulmonary  Pt's lungs clear but diminished at the bases. Pt noted for 2+ edema to BLE, see EMAR for Diuril, Aldactone and Lasix dose. Pt encouraged fluid limitation, see Doc Flow Sheet for daily weights and strict I&O, continue to monitor.              Problem: Breathing Pattern - Ineffective  Goal: Respiratory rate, rhythm and depth return to baseline  Pt's lungs clear but diminished from mid-bases r/t PAH. Pt on Sildenafil, Opsumit and Flolan, See EMAR for Flolan dose. Flolan dose verified at start of shift. Pt has a spare cassette at the bedside.Flolan's ice changed Q6 hrs. Flolan infusing thru a dedicated central line, no kinks/leakage noted. Pt denies side effects from Flolan, continue to monitor.                 Problem: Tissue Perfusion, Cardiopulmonary - Altered  Goal: Hemodynamic stability  Pt ao x4, VS stable. Pt SR on tele. Pt's skin warm & dry, pulses palpable. Pt voiding freely, noted edema to ble. Pt denies cp/pressure/palpitations, continue to monitor.

## 2016-05-31 NOTE — Plan of Care (Addendum)
Problem: Fluid Volume Excess  Goal: Absence of edema; peripheral and/or pulmonary  Pt's lungs clear but diminished at the bases. Pt noted for 2+ edema to BLE, see EMAR for Diuril and Lasix dose. Pt encouraged fluid limitation, see Doc Flow Sheet for daily weights and strict I&O, continue to monitor.          Problem: Breathing Pattern - Ineffective  Goal: Respiratory rate, rhythm and depth return to baseline  Pt's lungs clear but diminished from mid-bases r/t PAH. Pt on Sildenafil, Opsumit and Flolan, See EMAR for Flolan dose. Flolan dose verified at start of shift. Pt has a spare cassette at the bedside.Flolan's ice changed Q6 hrs. Flolan infusing thru a dedicated central line, no kinks/leakage noted. Pt denies side effects from Flolan, continue to monitor.       Problem: Tissue Perfusion, Cardiopulmonary - Altered  Goal: Hemodynamic stability   Pt ao x4, VS stable. Pt SR-ST 1st degree AVB on tele. Pt's skin warm & dry, pulses palpable. Pt voiding freely, noted edema to ble. Pt denies cp/pressure/palpitations, continue to monitor.

## 2016-05-31 NOTE — Plan of Care (Signed)
Problem: Fluid Volume Excess  Goal: Absence of edema; peripheral and/or pulmonary  Outcome: Progressing toward goal, anticipate improvement over: next 12-24 hours  Pt has 2+ edema to bilateral lower extremities. Pt receiving IVP lasix and tolerating. Strict I&O being measured.

## 2016-05-31 NOTE — Progress Notes (Signed)
PULMONARY/CRITICAL CARE ATTENDING NOTE  Current Hospital 4 Days 3 Hours    Medications:  Scheduled Meds   azelastine  1 spray BID    chlorothiazide  250 mg Q12H    colchicine  0.6 mg Daily    docusate sodium  250 mg Daily    enoxaparin  40 mg Daily    febuxostat  40 mg Daily    ferrous sulfate  604 mg Daily    folic acid  0.5 mg Daily    furosemide  80 mg Q6H    macitentan  10 mg Daily    medroxyPROGESTERone  1.25 mg Daily    potassium chloride  40 mEq Once    potassium chloride  40 mEq Daily    Prostacyclin Cassette Change   Daily    Prostacyclin Ice Pack   Q6H    Prostacyclin Tubing   Once per day on Mon Wed Fri    Prostacylin Batteries   Once per day on Mon    sildenafil  80 mg TID    sodium chloride (PF)  3 mL Q8H    spironolactone  50 mg Daily     PRN Meds   acetaminophen  650 mg Q4H PRN    nalOXone  0.1 mg Q2 Min PRN    sodium chloride (PF)  3 mL PRN    sodium chloride   Continuous PRN     IV Meds   epoprostenol (FLOLAN) infusion 32 ng/kg/min (05/30/16 1925)    sodium chloride         Vitals:    Latest Entry  Range (last 24 hours)    Temperature: (P) 97.8 F (36.6 C)  Temp  Avg: 98 F (36.7 C)  Min: 97.6 F (36.4 C)  Max: 98.5 F (36.9 C)    Blood pressure (BP): (P) 130/65  BP  Min: 117/60  Max: 139/58    Heart Rate: 103  Pulse  Avg: 98.3  Min: 93  Max: 103    Respirations: 20  Resp  Avg: 19  Min: 18  Max: 20    SpO2: (P) 95 %  SpO2  Avg: 94.9 %  Min: 93 %  Max: 96 %       No Data Recorded     Weight: (P) 73.3 kg (161 lb 9.6 oz)  Percentage Weight Change (%): (P) 0.14 %       Intake/Output Summary (Last 24 hours) at 05/31/16 1105  Last data filed at 05/31/16 0910   Gross per 24 hour   Intake              549 ml   Output             3200 ml   Net            -2651 ml       Exam:   Gen: NAD  NECK: + JVD  CV: RRR, + TR murmur no S3  RESP: Rales at bases bilat  ABD: modestly distended, non tender  EXT: L>R LE edema  NEURO: non-focal, grossly intact    Labs:   CBC  Recent Labs    05/29/16   0521  05/30/16   0530   WBC  4.0  3.5*   HGB  10.3*  10.3*   HCT  34.3  33.3*   PLT  85*  74*   SEG  61  53   LYMPHS  26  36   MONOS  9  8  Chemistry  Recent Labs      05/29/16   0521  05/30/16   0530  05/30/16   1814  05/31/16   0830   NA  139  140  138  137   K  3.0*  2.9*  4.3  3.0*   CL  97*  97*  97*  93*   BICARB  30*  30*  31*  32*   BUN  53*  58*  60*  62*   CREAT  1.29*  1.45*  1.40*  1.44*   GLU  107*  131*  92  90   [place]  9.7  9.8  9.9  10.4   MG  2.2  2.4   --    --    PHOS  3.6  3.5   --    --          ASSESSMENT/PLAN:  66 yo F with WHO I PAH associated with CTD admitted following RHC 5/15/18with acute on chronic right heart failure and volume overload.  -Continue IV lasix for net negative 2 L/day  -Will d/c diuril  -Replete K  -Follow daily weights  -IncreaseIV epo to 33ng/kg/min - goal approx 40 ng.  -Continue sildenafil 80 mg TID  -Continue macitentan 10 mg PO QDAY (new start this admission).  -Continue remainder of medications for gout  -LMWH for DVT prophylaxis  -Follow thrombocytopenia    [name] MD  Pager [phone]

## 2016-06-01 ENCOUNTER — Encounter (HOSPITAL_COMMUNITY): Payer: Self-pay

## 2016-06-01 LAB — COMPREHENSIVE METABOLIC PANEL, BLOOD
ALT (SGPT): 16 U/L (ref 0–33)
AST (SGOT): 23 U/L (ref 0–32)
Albumin: 3.7 g/dL (ref 3.5–5.2)
Alkaline Phos: 163 U/L — ABNORMAL HIGH (ref 35–140)
Anion Gap: 12 mmol/L (ref 7–15)
BUN: 64 mg/dL — ABNORMAL HIGH (ref 8–23)
Bicarbonate: 31 mmol/L — ABNORMAL HIGH (ref 22–29)
Bilirubin, Tot: 0.43 mg/dL (ref <1.2)
Calcium: 10.2 mg/dL (ref 8.5–10.6)
Chloride: 94 mmol/L — ABNORMAL LOW (ref 98–107)
Creatinine: 1.6 mg/dL — ABNORMAL HIGH (ref 0.51–0.95)
GFR: 32 mL/min
Glucose: 122 mg/dL — ABNORMAL HIGH (ref 70–99)
Potassium: 3.8 mmol/L (ref 3.5–5.1)
Sodium: 137 mmol/L (ref 136–145)
Total Protein: 7.3 g/dL (ref 6.0–8.0)

## 2016-06-01 LAB — BASIC METABOLIC PANEL, BLOOD
Anion Gap: 12 mmol/L (ref 7–15)
BUN: 62 mg/dL — ABNORMAL HIGH (ref 8–23)
Bicarbonate: 28 mmol/L (ref 22–29)
Calcium: 9.9 mg/dL (ref 8.5–10.6)
Chloride: 95 mmol/L — ABNORMAL LOW (ref 98–107)
Creatinine: 1.32 mg/dL — ABNORMAL HIGH (ref 0.51–0.95)
GFR: 40 mL/min
Glucose: 110 mg/dL — ABNORMAL HIGH (ref 70–99)
Potassium: 3.9 mmol/L (ref 3.5–5.1)
Sodium: 135 mmol/L — ABNORMAL LOW (ref 136–145)

## 2016-06-01 MED ORDER — BUMETANIDE 2 MG OR TABS
2.0000 mg | ORAL_TABLET | Freq: Three times a day (TID) | ORAL | Status: DC
Start: 2016-06-01 — End: 2016-06-02
  Administered 2016-06-01 – 2016-06-02 (×4): 2 mg via ORAL
  Filled 2016-06-01 (×4): qty 1

## 2016-06-01 NOTE — Plan of Care (Signed)
Problem: Fluid Volume Excess  Goal: Absence of edema; peripheral and/or pulmonary  Outcome: Progressing toward goal, anticipate improvement over: Next 24-48 hours  Pt is compliant with fluid intake. Daily weight. Lungs are clear with diminished bases per ascultation. Pt is on diuretic per MD orders. Will continue to monitor and assess at bed side.. Will continue to monitor. On room air and tolerating well. Denies any chest pain.          Problem: Breathing Pattern - Ineffective  Goal: Respiratory rate, rhythm and depth return to baseline  Outcome: Progressing toward goal, anticipate improvement over: Next 24-48 hours  Monitored breathing and lung sounds. Patient instructed to report difficulty breathing/shortness of breath. Assessed patient for signs or symptoms of hypoxia. Patient reports some dyspnea with exertion. See assessment for pulmonary details. No acute respiratory distress noted. On 3L nasal cannula and tolerating well. Pt is on flolan and tolerating well.         Problem: Tissue Perfusion, Cardiopulmonary - Altered  Goal: Hemodynamic stability  Outcome: Progressing toward goal, anticipate improvement over: >48 hours  Please see pts doc flow for vitals. No s/s of distress at this time. Encouraged pt to notify nurse if these symptoms arise.Tele monitor on and audible. Will continue to montior and assess at bedside. Will update PRN with any changes.

## 2016-06-01 NOTE — Progress Notes (Signed)
Pulmonary Hypertension Service Progress Note    Date of Admission: 05/27/2016    Date of Service: 06/02/2016    Chief Complaint: R arm/hand pain swelling    Subjective/HPI:   Reports pain/swelling of her R wrist near her IV. Breathing still labored although unchanged. Ongoing dry cough. Reports low grade fever overnight. Ongoing LE edema. Appetite is poor.    Pertinent Medications:  - flolan 33ng/kg/min    Current Facility-Administered Medications   Medication    acetaminophen (TYLENOL) tablet 650 mg    docusate sodium (COLACE) capsule 250 mg    enoxaparin (LOVENOX) injection 40 mg    epoprostenol (FLOLAN) 60,000 ng/mL in glycine diluent 100 mL infusion    febuxostat (ULORIC) tablet 40 mg    ferrous sulfate EC tablet 563 mg    folic acid (FOLVITE) tablet 0.5 mg    furosemide (LASIX) tablet 80 mg    macitentan (OPSUMIT) tablet 10 mg    medroxyPROGESTERone (PROVERA) tablet 1.25 mg    metolazone (ZAROXOLYN) tablet 5 mg    nalOXone (NARCAN) injection 0.1 mg    potassium chloride (KLOR-CON) ER tablet 40 mEq    Prostacyclin Cassette Change    Prostacyclin Ice Pack    Prostacyclin Tubing    Prostacylin Batteries    sildenafil (REVATIO, VIAGRA) tablet 80 mg    sodium chloride (PF) 0.9 % flush 3 mL    sodium chloride (PF) 0.9 % flush 3 mL    sodium chloride 0.9 % TKO infusion    spironolactone (ALDACTONE) tablet 50 mg     Review of Systems:  - Constitutional: + low grade fevers; denies chills and   - ENT: denies rhinorrhea, sore throat  - Cardiac: denies chest pain, palpitations; + edema   - Respiratory: + dyspnea, dry coughing; denies hemoptysis  - GI: denies nausea, vomiting, abdominal pain or diarrhea; + abd distention   - GU: denies dysuria, hematuria   - Skin: + R wrist rash/swelling; denies new ecchymoses   - Neuro: denies headaches, parasthesias or paresis   - MSK: denies myalgias or arthralgias     Objective  Temperature:  [98.9 F (37.2 C)-99.5 F (37.5 C)] 99 F (37.2 C) (05/21 1309)  Blood  pressure (BP): (116-142)/(59-74) 126/71 (05/21 1309)  Heart Rate:  [95-106] 102 (05/21 1309)  Respirations:  [18-22] 20 (05/21 1309)  Pain Score: 2 (05/21 1309)  O2 Device: Nasal cannula (05/21 1309)  O2 Flow Rate (L/min):  [3 l/min] 3 l/min (05/21 1309)  SpO2:  [92 %-100 %] 94 % (05/21 1309)  I/O: 1540/1550 = -10cc/hour    Physical Exam  General: chronically ill appearing female, laying in bed comfortably. Alert and oriented. Pleasant. Accompanied by her husband.    HEENT: NC/AT. PERRL, EOMI. Scler anicteric.  Moist oral mucosa.   Neck: + JVD  CV: RRR, loud S2  Lungs: Good effort. Bibasilar crackles.   Abdomen: Soft, mild distention, NT, + BS. No guarding or rebound  Extremities: 2+ LE edema. Warm distal extremities.   Skin: R wrist swelling/erythema   Neuro: CN II-XII grossly intact, strength grossly intact  Psych: flat affect     Labs  Recent Labs      06/01/16   0534  06/01/16   1908  06/02/16   0515   NA  137  135*  134*   K  3.8  3.9  4.2   CL  94*  95*  95*   BICARB  31*  28  27   BUN  64*  62*  64*   CREAT  1.60*  1.32*  1.45*   [place]  10.2  9.9  10.1   MG   --    --   2.3   PHOS   --    --   3.3   TP  7.3   --    --    ALB  3.7   --    --    TBILI  0.43   --    --    AST  23   --    --    ALT  16   --    --    ALK  163*   --    --        No results for input(s): WBC, HGB, HCT, MCV, PLT, SEG, BANDS, LYMPHS, MONOS, EOS in the last 72 hours.    No results for input(s): PTT, INR in the last 72 hours.    No results for input(s): CPK, CKMB[facility], TROPONIN in the last 72 hours.    No results for input(s): LACTATE in the last 72 hours.    Pertinent Micro  - none this admission     Antibiotics  - none     Pertinent Imaging  - CXR, 1V (05/27/2016):  - Radiology Impression:  Improved aeration in the left lung base.  No other change compared to 05/02/2016. Stable findings of pulmonary hypertension including cardiac enlargement hand enlargement of the central pulmonary arteries. Stable bilateral lung reticulation corresponding  to pulmonary parenchymal findings of pulmonary hypertension as seen on prior CT. No definite superimposed consolidation.      - Abd U/S (05/29/2016):  Small amount of perihepatic ascites.  Cholelithiasis without evidence of acute cholecystitis.  Hepatosplenomegaly    - CT Chest w/o IVC (05/04/2016):  - Radiology Impression:  1. Severe enlargement of the pulmonary artery with increased calcification along the pulmonary arterial walls compatible with longstanding pulmonary hypertension. Progressed parenchymal changes of the lungs compatible with pulmonary hypertension.  2. Increased thin wall l cystic esions seen within the lungs, nonspecific be can be seen with underlying lymphocytic interstitial pneumonia, which can be seen in patients with lupus/Sjogren's disease.  3. Cardiomegaly with evidence of right heart dysfunction. Trace nonspecific pericardial fluid with pericardial thickening suspected. In a patient with known lupus underlying pericarditis/serositis may be present. Correlation with echocardiogram is recommended.  4. Likely reactive adenopathy of the thorax.  5. Additional findings as detailed above.  - Independently Reviewed:       Misc:  - Spirometry (05/10/2016):  - FEV1/FVC 63%  - FEV1 0.79 L (38%)  - FVC 1.24 L (47%)  - TLC 2.73 L (58%)  - RV 1.49 L (75%); RV/TLC 130% predicted  - DLCO 19.90 (60%)  - 11mt 410 meters (12/12/2015)      - TTE (05/12/2016):  1. The left ventricular size is normal and the left ventricular systolic function is normal.  2. The right ventricular size is moderately enlarged and systolic function is reduced.  3. Moderate tricuspid regurgitation.  4. Severe pulmonary hypertension with right ventricular systolic pressure measuring 70 mmHg.  5. Compared to prior study , PA pressures are slightly lower.    - RHC (05/27/2016):    - prior RHC done 02/2016 with PAP 97/35/58, PVR 584 D (7.30 WU), fCO/CI 6.85/3.9, tdCO/CI 6.17/3.51    ASSESSMENT AND PLAN  SJya Hughstonis a 66year  old year old female with SLE/Sjogren's and WHO  Group I PAH who was admitted 5/15 after RHC was concerning for worsening hemodynamics. She has been on triple therapy including flolan. Her problem list is as follows:     # WHO Group I PAH with RV Overload: underlying PAH in the setting of prior anorexigen use and CTD (Sjogrens). RHC 5/15 with PAP 92/37/62, PAOP 12, tdPVR 9.28 WU and normal CO/CI. Currently on flolan 33ng/kg/min  - continue diuresis -- will switch back to lasix as per patient request; continue and spironolactone 50 mg po daily; metolazone 5 mg daily ordered today as well   - continue triple therapy with PRA with flolan (plan to increase to 34 ng/kg/min this afternoon), ERA with opsumit 10 daily and PDE-I with sildenafil 80 TID  -- note, since admission the flolan dose has been increased and opsumit is a new medication for her      # Acute on chronic hypoxemic respiratory failure: secondary to underlying PAH with likely destruction of cap-alveolar membrane and subsequent diffusion impairment (Fick's Law). 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 Stable on 3 LPM  - continue supplemental O2 as needed  - continue treatment of PAH, as above  - defer repeat imaging for now     # ARF on CKD: baseline likely 1.1 - 1.2, currently 1.45. 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 Less likely [facility], [facility], [place]  - outpatient follow up with rheumatology and pulmonary  - will need repeat imaging down the line     # Pancytopenia: ANC 1.8 on 5/18. Appears to be a chronic proces, ? Bone marrow suppression. No sign of infectious process  - repeat CBC in the am   - check B12,  folate, TSH and Fe studies     # Elevated AP: ? Congestive hepatopathy  - continue treatment of R heart failure   - monitor LFT's    # Gout: last uric acid 8.6 (5/21), improved from 11.8 (4/24)  - continue uloric for now     # FEN:  - 2 gram Na+     # PPx:  - DVT: LMWH; monitor renal function  - Stress Ulcer: none   - Prn: APAP, zofran    # Code:  - FC/FT     # Dispo:  - up-titration of flolan today  - continue current care     # Misc:  - am labs/imaging: ordered   - consults: none   - invasion: central access via SC line   - point of contact: [name] (husband) - [phone]    This patient was seen and discussed with Dr. [name].    Alfonse Ras, M.D.  Fellow  Department of Pulmonary and Critical Care Medicine  [phone]      Pulmonary Vascular Attending Addendum:  Patient was examined with Dr. Candis Schatz, imaging and tests reviewed.  Please see above note for full details and plan.    66 yo F with WHO I PAH associated with CTD admitted following RHC 5/15/18with acute on chronic right heart failure and volume overload.  -Continue PO bumex and add metolazone  -Follow daily weights  -IncreaseIV epo to 34ng/kg/min -  goal approx 40 ng.  -Continue sildenafil 80 mg TID  -Continue macitentan 10 mg PO QDAY (new start this admission).  -Continue uloric for gout  -LMWH for DVT prophylaxis  -Follow thrombocytopenia    [name] MD  Pager [phone]

## 2016-06-01 NOTE — Progress Notes (Signed)
PULMONARY/CRITICAL CARE ATTENDING NOTE  Current Hospital 5 Days 1 Hour    Medications:  Scheduled Meds   colchicine  0.6 mg Daily    docusate sodium  250 mg Daily    enoxaparin  40 mg Daily    febuxostat  40 mg Daily    ferrous sulfate  175 mg Daily    folic acid  0.5 mg Daily    macitentan  10 mg Daily    medroxyPROGESTERone  1.25 mg Daily    potassium chloride  40 mEq Daily    Prostacyclin Cassette Change   Daily    Prostacyclin Ice Pack   Q6H    Prostacyclin Tubing   Once per day on Mon Wed Fri    Prostacylin Batteries   Once per day on Mon    sildenafil  80 mg TID    sodium chloride (PF)  3 mL Q8H    spironolactone  50 mg Daily     PRN Meds   acetaminophen  650 mg Q4H PRN    nalOXone  0.1 mg Q2 Min PRN    sodium chloride (PF)  3 mL PRN    sodium chloride   Continuous PRN     IV Meds   epoprostenol (FLOLAN) infusion 33 ng/kg/min (05/31/16 2000)    sodium chloride         Vitals:    Latest Entry  Range (last 24 hours)    Temperature: 98.3 F (36.8 C)  Temp  Avg: 98.4 F (36.9 C)  Min: 97.9 F (36.6 C)  Max: 99.1 F (37.3 C)    Blood pressure (BP): 135/73  BP  Min: 116/59  Max: 139/84    Heart Rate: 96  Pulse  Avg: 97.7  Min: 96  Max: 99    Respirations: 18  Resp  Avg: 19.6  Min: 18  Max: 20    SpO2: 95 %  SpO2  Avg: 94.6 %  Min: 93 %  Max: 95 %       No Data Recorded     Weight: 73.4 kg (161 lb 13.1 oz)  Percentage Weight Change (%): 0.14 %       Intake/Output Summary (Last 24 hours) at 06/01/16 0903  Last data filed at 06/01/16 0559   Gross per 24 hour   Intake          1188.74 ml   Output             2300 ml   Net         -1111.26 ml     Exam:   Gen: NAD  NECK: + JVD  CV: RRR, + TR murmur no S3  RESP: Rales at bases bilat  ABD: modestly distended, non tender  EXT: L>R LE edema  NEURO: non-focal, grossly intact      Labs:   CBC  Recent Labs      05/30/16   0530   WBC  3.5*   HGB  10.3*   HCT  33.3*   PLT  74*   SEG  53   LYMPHS  36   MONOS  8      Chemistry  Recent Labs      05/30/16   0530   05/31/16   1845  06/01/16   0534   NA  140   < >  137  137   K  2.9*   < >  3.4*  3.8   CL  97*   < >  93*  94*   BICARB  30*   < >  31*  31*   BUN  58*   < >  65*  64*   CREAT  1.45*   < >  1.43*  1.60*   GLU  131*   < >  116*  122*   [place]  9.8   < >  10.0  10.2   MG  2.4   --    --    --    PHOS  3.5   --    --    --     < > = values in this interval not displayed.     Recent Labs      06/01/16   0534   ALK  163*   AST  23   ALT  16   TBILI  0.43   ALB  3.7          ASSESSMENTPLAN:  66 yo F with WHO I PAH associated with CTD admitted following RHC 5/15/18with acute on chronic right heart failure and volume overload.  -Transition to PO bumex for net negative 2 L/day  -Follow daily weights  -ContinueIV epo @ 33ng/kg/min - goal approx 40 ng.  -Continue sildenafil 80 mg TID  -Continue macitentan 10 mg PO QDAY (new start this admission).  -D/C colchicine with elevated Cr. Continue uloric for gout  -LMWH for DVT prophylaxis  -Follow thrombocytopenia    [name] MD  Pager [phone]

## 2016-06-02 LAB — BASIC METABOLIC PANEL, BLOOD
Anion Gap: 12 mmol/L (ref 7–15)
BUN: 64 mg/dL — ABNORMAL HIGH (ref 8–23)
Bicarbonate: 27 mmol/L (ref 22–29)
Calcium: 10.1 mg/dL (ref 8.5–10.6)
Chloride: 95 mmol/L — ABNORMAL LOW (ref 98–107)
Creatinine: 1.45 mg/dL — ABNORMAL HIGH (ref 0.51–0.95)
GFR: 36 mL/min
Glucose: 82 mg/dL (ref 70–99)
Potassium: 4.2 mmol/L (ref 3.5–5.1)
Sodium: 134 mmol/L — ABNORMAL LOW (ref 136–145)

## 2016-06-02 LAB — MAGNESIUM, BLOOD: Magnesium: 2.3 mg/dL (ref 1.6–2.4)

## 2016-06-02 LAB — PHOSPHORUS, BLOOD: Phosphorous: 3.3 mg/dL (ref 2.7–4.5)

## 2016-06-02 LAB — URIC ACID, BLOOD: Uric Acid: 8.4 mg/dL — ABNORMAL HIGH (ref 2.4–5.7)

## 2016-06-02 MED ORDER — FUROSEMIDE 40 MG OR TABS
80.0000 mg | ORAL_TABLET | Freq: Three times a day (TID) | ORAL | Status: DC
Start: 2016-06-02 — End: 2016-06-02
  Administered 2016-06-02: 80 mg via ORAL
  Filled 2016-06-02: qty 2

## 2016-06-02 MED ORDER — PROSTACYCLIN BATTERIES - PUMP (~~LOC~~)
Status: DC
Start: 2016-06-02 — End: 2016-06-21
  Administered 2016-06-02 – 2016-06-16 (×3)
  Filled 2016-06-02: qty 1

## 2016-06-02 MED ORDER — GLYCINE DILUENT IV SOLN
35.0000 ng/kg/min | INTRAVENOUS | Status: DC
Start: 2016-06-02 — End: 2016-06-21
  Administered 2016-06-02: 34 ng/kg/min via INTRAVENOUS
  Administered 2016-06-03 – 2016-06-21 (×64): 35 ng/kg/min via INTRAVENOUS
  Filled 2016-06-02 (×22): qty 6

## 2016-06-02 MED ORDER — PROSTACYCLIN TUBING (~~LOC~~)
Status: DC
Start: 2016-06-02 — End: 2016-06-21
  Administered 2016-06-02 – 2016-06-18 (×8)
  Administered 2016-06-20: 1
  Filled 2016-06-02: qty 1

## 2016-06-02 MED ORDER — PROSTACYCLIN CASSETTE CHANGE (~~LOC~~)
Freq: Every day | Status: DC
Start: 2016-06-02 — End: 2016-06-21
  Administered 2016-06-02 – 2016-06-19 (×18)
  Administered 2016-06-20: 1
  Filled 2016-06-02: qty 1

## 2016-06-02 MED ORDER — FUROSEMIDE 40 MG OR TABS
80.0000 mg | ORAL_TABLET | Freq: Three times a day (TID) | ORAL | Status: DC
Start: 2016-06-02 — End: 2016-06-03
  Administered 2016-06-02 – 2016-06-03 (×2): 80 mg via ORAL
  Filled 2016-06-02 (×2): qty 2

## 2016-06-02 MED ORDER — PROSTACYCLIN ICE PACK (~~LOC~~)
Freq: Four times a day (QID) | Status: DC
Start: 2016-06-02 — End: 2016-06-21
  Administered 2016-06-02 – 2016-06-04 (×7)
  Administered 2016-06-04 (×2): 2
  Administered 2016-06-04 – 2016-06-05 (×4)
  Administered 2016-06-05: 2
  Administered 2016-06-06: 18:00:00
  Administered 2016-06-06: 2
  Administered 2016-06-06: 12:00:00
  Administered 2016-06-06 – 2016-06-07 (×2): 2
  Administered 2016-06-07: 12:00:00
  Administered 2016-06-07: 2
  Administered 2016-06-07 – 2016-06-13 (×23)
  Administered 2016-06-13 (×2): 2
  Administered 2016-06-14: 18:00:00
  Administered 2016-06-14: 2
  Administered 2016-06-14 (×2)
  Administered 2016-06-15: 2
  Administered 2016-06-15
  Administered 2016-06-15: 2
  Administered 2016-06-15 – 2016-06-18 (×10)
  Administered 2016-06-18 (×2): 2
  Administered 2016-06-18: 12:00:00
  Administered 2016-06-19 (×2): 2
  Administered 2016-06-19
  Administered 2016-06-19: 2
  Administered 2016-06-20 (×2)
  Administered 2016-06-20: 2
  Administered 2016-06-20 – 2016-06-21 (×4)
  Filled 2016-06-02: qty 1

## 2016-06-02 MED ORDER — METOLAZONE 5 MG OR TABS
5.0000 mg | ORAL_TABLET | Freq: Every day | ORAL | Status: DC
Start: 2016-06-02 — End: 2016-06-05
  Administered 2016-06-02 – 2016-06-04 (×3): 5 mg via ORAL
  Filled 2016-06-02 (×3): qty 1

## 2016-06-02 NOTE — Plan of Care (Signed)
Problem: Fluid Volume Excess  Goal: Absence of edema; peripheral and/or pulmonary  Outcome: Progressing toward goal, anticipate improvement over: next 12-24 hours  Pt has BLE edema, will monitor for worsening of edema and other s/s of fluid retention such as: labs and weight.     Problem: Breathing Pattern - Ineffective  Goal: Respiratory rate, rhythm and depth return to baseline  Outcome: Progressing toward goal, anticipate improvement over: next 12-24 hours  Respiratory rate, rhythm and depth are wnl. Will monitor respiratory status qshift, with vitals and as needed.     Problem: Tissue Perfusion, Cardiopulmonary - Altered  Goal: Hemodynamic stability  Outcome: Progressing toward goal, anticipate improvement over: next 12-24 hours  Pts blood pressure is wnl, pt is hemodynamically stable at this moment. Pt is also a flolan pt, will monitor for s/s of decreased tissue perfusion such as; decreased blood pressure, changes in mental status, cool/pale extremities, cyanosis, oliguria/anuria. Pt is on lasix, will montior intake and output.   Goal: Early recognition of deterioration  Outcome: Progressing toward goal, anticipate improvement over: next 12-24 hours  Will monitor for early signs of changes such as: changes in mental status, changes in blood pressure and cardiac rhythm.

## 2016-06-02 NOTE — Plan of Care (Signed)
Problem: Fluid Volume Excess  Goal: Absence of edema; peripheral and/or pulmonary  Outcome: Progressing toward goal, anticipate improvement over: >48 hours  Pt currently on Bumex TID and initially had difficulty emptying bladder on day shift but able to void this shift without difficulty, post-void residual 0 ml when bladder scanned.   Abdomen soft but still distended.  +1 pitting edema to ankles/feet.  Encouraged pt to continue to elevate BLE on pillows to promote drainage of excess fluid.  Monitoring weights daily and monitoring BMP q 12 hrs.    Problem: Breathing Pattern - Ineffective  Goal: Respiratory rate, rhythm and depth return to baseline  Outcome: Progressing toward goal, anticipate improvement over: Next 24-48 hours  Pt with mild dyspnea with ambulation/activity but recovers quickly once at rest.  Pt maintaining 02 sats >92% on 3L NC and have no to titrate 02 up.  No s/o respiratory distress this shift.    Problem: Tissue Perfusion, Cardiopulmonary - Altered  Goal: Hemodynamic stability  Outcome: Goal Met  VSS.  Pt remains NSR/ST with isolated PVCs on continuous cardiac monitoring.  Palpable pulses with brisk cap refill.  +1 edema to ankles/feet.  Pt denies any chest pain/palpitations or prolonged SOB and instructed patient to report if she experiences any cardiac symptoms.

## 2016-06-02 NOTE — Progress Notes (Signed)
Pulmonary Hypertension Service Progress Note    Date of Admission: 05/27/2016    Date of Service: 06/03/2016    Chief Complaint: "My legs are still swollen"    Subjective/HPI:   Still reporting LE swelling although she is making urine. At times she feels like she is retaining in her bladder. She denies any fevers, chills. She denies any chest pain. She walked some this morning, felt better although still with DOE. Appetite is adequate.     Interval Events:  - flolan dose increased to 34 ng/kg/min  - bumex changed back to lasix     Pertinent Medications:  - flolan at 34 ng/kg/min    Current Facility-Administered Medications   Medication    acetaminophen (TYLENOL) tablet 650 mg    docusate sodium (COLACE) capsule 250 mg    enoxaparin (LOVENOX) injection 40 mg    epoprostenol (FLOLAN) 60,000 ng/mL in glycine diluent 100 mL infusion    febuxostat (ULORIC) tablet 40 mg    ferrous sulfate EC tablet 973 mg    folic acid (FOLVITE) tablet 0.5 mg    furosemide (LASIX) tablet 100 mg    macitentan (OPSUMIT) tablet 10 mg    medroxyPROGESTERone (PROVERA) tablet 1.25 mg    metolazone (ZAROXOLYN) tablet 5 mg    nalOXone (NARCAN) injection 0.1 mg    potassium chloride (KLOR-CON) ER tablet 40 mEq    Prostacyclin Cassette Change    Prostacyclin Ice Pack    Prostacyclin Tubing    Prostacylin Batteries    sildenafil (REVATIO, VIAGRA) tablet 80 mg    sodium chloride (PF) 0.9 % flush 3 mL    sodium chloride (PF) 0.9 % flush 3 mL    sodium chloride 0.9 % TKO infusion    spironolactone (ALDACTONE) tablet 50 mg     Review of Systems:  - Constitutional: denies fevers, chills and night sweats   - ENT: denies rhinorrhea, sore throat  - Cardiac: denies chest pain, palpitations; + edema   - Respiratory: + dyspnea, dry coughing (stable); denies hemoptysis  - GI: denies nausea, vomiting, abdominal pain or diarrhea; + abd distention   - GU: denies dysuria, hematuria   - Skin: + R wrist rash/swelling, improved; denies new  ecchymoses   - Neuro: denies headaches, parasthesias or paresis   - MSK: denies myalgias or arthralgias     Objective  Temperature:  [98.3 F (36.8 C)-99 F (37.2 C)] 98.6 F (37 C) (05/22 1302)  Blood pressure (BP): (111-128)/(58-71) 124/63 (05/22 1302)  Heart Rate:  [97-103] 101 (05/22 1302)  Respirations:  [18-22] 22 (05/22 1302)  Pain Score: 0 (05/22 1302)  O2 Device: Nasal cannula (05/22 1302)  O2 Flow Rate (L/min):  [3 l/min-3.5 l/min] 3 l/min (05/22 1302)  SpO2:  [92 %-94 %] 94 % (05/22 1302)  I/O: 1050/2200 = -1150cc/hour  UOP of 2200 cc/24 hours  Wt: 70.4 kg (74.4 kg, 5/21)    Physical Exam  General: chronically ill appearing female, laying in bed comfortably. Alert and oriented. Pleasant. Accompanied by her husband.    HEENT: NC/AT. PERRL, EOMI. Scler anicteric.  Moist oral mucosa.   Neck: + JVD  CV: RRR, loud S2  Lungs: Good effort. Bibasilar crackles.   Abdomen: Soft, mild distention, NT, + BS. No guarding or rebound  Extremities: 3+ LE edema, L slightly greater than right. Warm distal extremities.   Skin: R wrist swelling/erythema   Neuro: CN II-XII grossly intact, strength grossly intact  Psych: flat affect     Labs  Recent Labs      06/01/16   0534  06/01/16   1908  06/02/16   0515  06/03/16   0552   NA  137  135*  134*  133*   K  3.8  3.9  4.2  3.4*   CL  94*  95*  95*  91*   BICARB  31*  28  27  31*   BUN  64*  62*  64*  62*   CREAT  1.60*  1.32*  1.45*  1.42*   [place]  10.2  9.9  10.1  10.1   MG   --    --   2.3  2.3   PHOS   --    --   3.3  3.7   TP  7.3   --    --   7.5   ALB  3.7   --    --   3.6   TBILI  0.43   --    --   0.46   DBILI   --    --    --   <0.2   AST  23   --    --   29   ALT  16   --    --   23   ALK  163*   --    --   174*       Recent Labs      06/03/16   0552   WBC  4.2   HGB  10.2*   HCT  33.9*   MCV  80.1   PLT  82*   SEG  71   LYMPHS  18   MONOS  8   EOS  1       No results for input(s): PTT, INR in the last 72 hours.    No results for input(s): CPK, CKMB[facility], TROPONIN in  the last 72 hours.    No results for input(s): LACTATE in the last 72 hours.    Pertinent Micro  - none this admission     Antibiotics  - none     Pertinent Imaging  - CXR, 1V (05/27/2016):  - Radiology Impression:  Improved aeration in the left lung base.  No other change compared to 05/02/2016. Stable findings of pulmonary hypertension including cardiac enlargement hand enlargement of the central pulmonary arteries. Stable bilateral lung reticulation corresponding to pulmonary parenchymal findings of pulmonary hypertension as seen on prior CT. No definite superimposed consolidation.      - Abd U/S (05/29/2016):  Small amount of perihepatic ascites.  Cholelithiasis without evidence of acute cholecystitis.  Hepatosplenomegaly    - CT Chest w/o IVC (05/04/2016):  - Radiology Impression:  1. Severe enlargement of the pulmonary artery with increased calcification along the pulmonary arterial walls compatible with longstanding pulmonary hypertension. Progressed parenchymal changes of the lungs compatible with pulmonary hypertension.  2. Increased thin wall l cystic esions seen within the lungs, nonspecific be can be seen with underlying lymphocytic interstitial pneumonia, which can be seen in patients with lupus/Sjogren's disease.  3. Cardiomegaly with evidence of right heart dysfunction. Trace nonspecific pericardial fluid with pericardial thickening suspected. In a patient with known lupus underlying pericarditis/serositis may be present. Correlation with echocardiogram is recommended.  4. Likely reactive adenopathy of the thorax.  5. Additional findings as detailed above.  - Independently Reviewed:       Misc:  - Spirometry (05/10/2016):  - FEV1/FVC 63%  -  FEV1 0.79 L (38%)  - FVC 1.24 L (47%)  - TLC 2.73 L (58%)  - RV 1.49 L (75%); RV/TLC 130% predicted  - DLCO 19.90 (60%)  - 1mt 410 meters (12/12/2015)      - TTE (05/12/2016):  1. The left ventricular size is normal and the left ventricular systolic function is  normal.  2. The right ventricular size is moderately enlarged and systolic function is reduced.  3. Moderate tricuspid regurgitation.  4. Severe pulmonary hypertension with right ventricular systolic pressure measuring 70 mmHg.  5. Compared to prior study , PA pressures are slightly lower.    - RHC (05/27/2016):    - prior RHC done 02/2016 with PAP 97/35/58, PVR 584 D (7.30 WU), fCO/CI 6.85/3.9, tdCO/CI 6.17/3.51    ASSESSMENT AND PLAN  SEvangelina Delanceyis a 66year old year old female with SLE/Sjogren's and WHO Group I PAH who was admitted 5/15 after RHC was concerning for worsening hemodynamics. She has been on triple therapy including flolan. Her problem list is as follows:     # WHO Group I PAH with RV Overload: underlying PAH in the setting of prior anorexigen use and CTD (Sjogrens). RHC 5/15 with PAP 92/37/62, PAOP 12, tdPVR 9.28 WU and normal CO/CI. Currently on flolan 33ng/kg/min  - continue diuresis -- lasix 100 mg oral TID, spironolactone 50 mg po daily and metolazone 5 mg daily   - continue triple therapy with PRA with flolan (currently at 34 ng/kg/min), ERA with opsumit 10 daily and PDE-I with sildenafil 80 TID  -- note, since admission the flolan dose has been increased and opsumit is a new medication for her      # Acute on chronic hypoxemic respiratory failure: secondary to underlying PAH with likely destruction of cap-alveolar membrane and subsequent diffusion impairment (Fick's Law). 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 Stable on 3 LPM  - continue supplemental O2 as needed  - continue treatment of PAH, as above  - defer repeat imaging for now     # ARF on CKD: baseline likely 1.1 - 1.2, currently 1.42. Likely cardiorenal  - continue treatment of PAH with triple therapy and diuresis  - monitor sCr and UOP  - avoid  nephrotoxins and renally dose medications     # Hypokalemia: 2/2 diuresis and poor po intake. 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 Less likely [facility], [facility], [place] - outpatient follow up with rheumatology and pulmonary  - will need repeat imaging down the line     # Pancytopenia: ongoing, although stable lines this am. ANC 1.8 on 5/18, now 3.0 this am. Appears to be a chronic proces, ? Bone marrow suppression. No sign of infectious process, TSH 3.49, Fe 26, Transferritin 191, ferritin 47; RBC folate and B12 pending (5/22)  - repeat CBC in the am   - continue oral Fe    # Elevated AP: ? Congestive hepatopathy. Stable this am   - continue treatment of R heart failure   - monitor LFT's    # Gout: last uric acid 8.6 (5/21), improved from 11.8 (4/24)  - continue uloric for now   - query R wrist pain/swelling as a gout flare -- defer treatment (steroids/NSAID's not  a great option at this time)    # FEN:  - 2 gram Na+   - KCl 40 mEq daily     # PPx:  - DVT: LMWH 40 daily; monitor renal function  - Stress Ulcer: none   - Prn: APAP, zofran    # Code:  - FC/FT     # Dispo:  - up-titration of flolan today  - continue current care     # Misc:  - am labs/imaging: ordered   - consults: none   - invasion: central access via SC line   - point of contact: [name] (husband) - [phone]    This patient was seen and discussed with Dr. [name].    Alfonse Ras, M.D.  Fellow  Department of Pulmonary and Critical Care Medicine  [phone]      Pulmonary Vascular Attending Addendum:  Patient was examined with Dr. Candis Schatz, imaging and tests reviewed.  Please see above note for full details and plan.    66 yo F with WHO I PAH associated with CTD admitted following RHC 5/15/18with acute on chronic right heart failure and volume overload.  -Continue PO bumexand metolazone  -Follow daily weights  -IncreaseIV epo to35ng/kg/min - goal  approx 40 ng.  -Continue sildenafil 80 mg TID  -Continue macitentan 10 mg PO QDAY (new start this admission).  -Continue uloric for gout  -LMWH for DVT prophylaxis  -Follow thrombocytopenia    [name] MD  Pager [phone]

## 2016-06-02 NOTE — Interdisciplinary (Signed)
06/02/16 1537   Follow Up/Progress   Is the Patient Ready for Discharge No   Barriers to Discharge Awaiting clinical improvement   Plan/Interventions Explore needs and options for aftercare, provide referrals     Per nursing rounds, pt unstable for d/c.  She remains on Flolan gtt.  Medication and care interventions continue.    Barriers to d/c: clinical improvement.  Transportation: family.  DCP: TBD.  Pt and care team aware and agree to further discuss d/c plan pending care team recommendations and clinical improvement.  CM to continue to follow for d/c needs.    Ria Clock MSN/RN/CNL/PHN  Inpatient care manager  [phone]

## 2016-06-02 NOTE — Plan of Care (Signed)
Problem: Fluid Volume Excess  Goal: Absence of edema; peripheral and/or pulmonary  Outcome: Progressing toward goal, anticipate improvement over: Next 24-48 hours  Pt is compliant with fluid intake. Daily weight. Lungs are clear with diminished bases per ascultation. Pt is on diuretic per MD orders. Will continue to monitor and assess at bed side.. Will continue to monitor. On room air and tolerating well. Denies any chest pain. Now on lasix PO and tolerating a lot better per pt states.    Problem: Breathing Pattern - Ineffective  Goal: Respiratory rate, rhythm and depth return to baseline  Outcome: Progressing toward goal, anticipate improvement over: Next 24-48 hours  Monitored breathing and lung sounds. Patient instructed to report difficulty breathing/shortness of breath. Assessed patient for signs or symptoms of hypoxia. Patient reports some dyspnea with exertion. See assessment for pulmonary details. No acute respiratory distress noted. On 3L nasal cannula and tolerating well. Pt is on flolan and tolerating well.     Problem: Tissue Perfusion, Cardiopulmonary - Altered  Goal: Hemodynamic stability  Outcome: Progressing toward goal, anticipate improvement over: >48 hours  Please see pts doc flow for vitals. No s/s of distress at this time. Encouraged pt to notify nurse if these symptoms arise.Tele monitor on and audible. Will continue to montior and assess at bedside. Will update PRN with any changes.

## 2016-06-03 LAB — LIVER PANEL, BLOOD
ALT (SGPT): 23 U/L (ref 0–33)
AST (SGOT): 29 U/L (ref 0–32)
Albumin: 3.6 g/dL (ref 3.5–5.2)
Alkaline Phos: 174 U/L — ABNORMAL HIGH (ref 35–140)
Bilirubin, Dir: <0.2 mg/dL (ref <0.2)
Bilirubin, Tot: 0.46 mg/dL (ref <1.2)
Total Protein: 7.5 g/dL (ref 6.0–8.0)

## 2016-06-03 LAB — CBC WITH DIFF, BLOOD
ANC-Automated: 3 10*3/uL (ref 1.6–7.0)
Abs Lymphs: 0.8 10*3/uL (ref 0.8–3.1)
Abs Monos: 0.3 10*3/uL (ref 0.2–0.8)
Basophils: 1 %
Eosinophils: 1 %
Hct: 33.9 % — ABNORMAL LOW (ref 34.0–45.0)
Hgb: 10.2 gm/dL — ABNORMAL LOW (ref 11.2–15.7)
Imm Gran %: 2 % — ABNORMAL HIGH (ref <1)
Imm Gran Abs: 0.1 10*3/uL (ref <0.1)
Lymphocytes: 18 %
MCH: 24.1 pg — ABNORMAL LOW (ref 26.0–32.0)
MCHC: 30.1 g/dL — ABNORMAL LOW (ref 32.0–36.0)
MCV: 80.1 um3 (ref 79.0–95.0)
Monocytes: 8 %
Plt Count: 82 10*3/uL — ABNORMAL LOW (ref 140–370)
RBC: 4.23 10*6/uL (ref 3.90–5.20)
RDW: 18.2 % — ABNORMAL HIGH (ref 12.0–14.0)
Segs: 71 %
WBC: 4.2 10*3/uL (ref 4.0–10.0)

## 2016-06-03 LAB — BASIC METABOLIC PANEL, BLOOD
Anion Gap: 11 mmol/L (ref 7–15)
BUN: 62 mg/dL — ABNORMAL HIGH (ref 8–23)
Bicarbonate: 31 mmol/L — ABNORMAL HIGH (ref 22–29)
Calcium: 10.1 mg/dL (ref 8.5–10.6)
Chloride: 91 mmol/L — ABNORMAL LOW (ref 98–107)
Creatinine: 1.42 mg/dL — ABNORMAL HIGH (ref 0.51–0.95)
GFR: 37 mL/min
Glucose: 134 mg/dL — ABNORMAL HIGH (ref 70–99)
Potassium: 3.4 mmol/L — ABNORMAL LOW (ref 3.5–5.1)
Sodium: 133 mmol/L — ABNORMAL LOW (ref 136–145)

## 2016-06-03 LAB — IRON, BLOOD: Iron: 26 ug/dL — ABNORMAL LOW (ref 37–145)

## 2016-06-03 LAB — FERRITIN, BLOOD: Ferritin: 47 ng/mL (ref 13–150)

## 2016-06-03 LAB — TSH, BLOOD: TSH: 3.49 u[IU]/mL (ref 0.27–4.20)

## 2016-06-03 LAB — PHOSPHORUS, BLOOD: Phosphorous: 3.7 mg/dL (ref 2.7–4.5)

## 2016-06-03 LAB — TRANSFERRIN, BLOOD: Transferrin: 191 mg/dL — ABNORMAL LOW (ref 200–360)

## 2016-06-03 LAB — MAGNESIUM, BLOOD: Magnesium: 2.3 mg/dL (ref 1.6–2.4)

## 2016-06-03 LAB — VITAMIN B12, BLOOD: Vitamin B12: 265 pg/mL (ref 232–1245)

## 2016-06-03 LAB — RBC FOLATE, BLOOD
Hct: 33.9
RBC Folate: >1240 ng/mL — ABNORMAL HIGH (ref 209–640)

## 2016-06-03 MED ORDER — FUROSEMIDE 40 MG OR TABS
100.0000 mg | ORAL_TABLET | Freq: Three times a day (TID) | ORAL | Status: DC
Start: 2016-06-03 — End: 2016-06-04
  Administered 2016-06-03 – 2016-06-04 (×3): 100 mg via ORAL
  Filled 2016-06-03 (×3): qty 1

## 2016-06-03 NOTE — Plan of Care (Signed)
Problem: Fluid Volume Excess  Goal: Absence of edema; peripheral and/or pulmonary  Outcome: Progressing toward goal, anticipate improvement over: next 12-24 hours  Pt has BLE edema, will monitor for worsening of edema and other s/s of fluid retention such as: labs and weight.       Problem: Breathing Pattern - Ineffective  Goal: Respiratory rate, rhythm and depth return to baseline  Outcome: Progressing toward goal, anticipate improvement over: next 12-24 hours  Respiratory rate, rhythm and depth are wnl. Will monitor respiratory status qshift, with vitals and as needed.       Problem: Tissue Perfusion, Cardiopulmonary - Altered  Goal: Hemodynamic stability  Outcome: Progressing toward goal, anticipate improvement over: next 12-24 hours  Pts blood pressure is wnl, pt is hemodynamically stable at this moment. Pt is also a flolan pt, will monitor for s/s of decreased tissue perfusion such as; decreased blood pressure, changes in mental status, cool/pale extremities, cyanosis, oliguria/anuria. Pt is on lasix, will montior intake and output.   Goal: Early recognition of deterioration  Outcome: Progressing toward goal, anticipate improvement over: next 12-24 hours  Will monitor for early signs of changes such as: changes in mental status, changes in blood pressure and cardiac rhythm.

## 2016-06-03 NOTE — Interdisciplinary (Signed)
Clinical Nutrition Screen:    Assessment: Per MD: year old year old female with SLE/Sjogren's and WHO Group I PAH who was admitted 5/15 after RHC was concerning for worsening hemodynamics. She has been on triple therapy including flolan.     Past Medical History:   Diagnosis Date    Pulmonary HTN     Sjoegren syndrome (CMS-HCC) 07/12/2009      No past surgical history on file.       Anthropometrics   Height: 5' 2"  (157.5 cm)   Weight: 70.4 kg (155 lb 3.3 oz)      IBW= 110lb  %IBW= 141%  Body mass index is 28.39 kg/(m^2).   UBW: 161lbs  Pt attributes wt changes due to edema.   Weights (last 14 days)     Date/Time Weight Weight Source Percentage Weight Change (%) Who    06/03/16 0802 70.4 kg (155 lb 3.3 oz) Standing scale -6.26 % NK    06/03/16 0545 75.1 kg (165 lb 9.1 oz) Standing scale 0.94 % JP    06/02/16 0056 74.4 kg (164 lb 0.4 oz) Standing scale 1.36 % SB    06/01/16 0427 73.4 kg (161 lb 13.1 oz) Standing scale 0.14 % RG    05/31/16 0910 73.3 kg (161 lb 9.6 oz) Standing scale 0.14 % MM    05/30/16 0530 73.2 kg (161 lb 6 oz) Standing scale -0.14 % SB    05/29/16 1230 73.3 kg (161 lb 9.6 oz) Standing scale -2.06 % EM    05/27/16 2025 74.8 kg (165 lb) Standing scale -2.65 % AD    05/27/16 0818 76.9 kg (169 lb 8 oz) Standing scale 0 % CP               Diet:Diet Custom: 2 Gram Sodium  PO Intake: Average intake of 97% x 17 meals. Pt reports some decreased appetite r/t flolan medication. Reports eating approximately 3 quarters of meal. Offered supplement, pt denied.    Tray Items Taken for the past 168 hrs:   Number of Items Taken Number of Items on Tray Diet Tolerance   05/28/16 0820 3 3 -   05/28/16 1424 3 4 Tolerates   05/29/16 0820 4 4 Tolerates   05/29/16 1230 3 3 Tolerates   05/29/16 1937 3 3 Tolerates   05/30/16 0846 4 4 Tolerates   05/30/16 1234 4 4 Tolerates   05/31/16 0910 4 4 Tolerates   05/31/16 1242 3 3 Tolerates   05/31/16 1800 4 4 Tolerates   06/01/16 0924 5 5 Tolerates   06/01/16 1304 6 6 Tolerates      06/01/16 1818 6 6 Tolerates   06/02/16 0906 5 5 Tolerates   06/02/16 1309 5 5 Tolerates   06/02/16 1700 5 6 Tolerates   06/03/16 0310 2 2 -     No data found.      Allergies/Intolerances:   Allergies   Allergen Reactions    Allopurinol Hives    Levaquin Swelling and Other     Severe joint pain      Ofloxacin Nausea and Vomiting    Chlorhexidine Itching    Sulfa Drugs Unspecified     Chewing Difficulty: no chew difficulty  GI:  mild diarrhea from medication Flolan per pt. Provided pt with diarrhea nutrition therapy handout. Pt requested metamucil. Offered other fiber supplements available here, pt denied. On Colace med.     Skin: WDL per head to toe assessment  Edema: +1 pitting edema BLE; +1 non-pitting  RU extremity; Trace general edema  Patient Lines/Drains/Airways Status    Active PICC Line / CVC Line / PIV Line / Drain / Airway / Intraosseous Line / Epidural Line / ART Line / Line Type / Wound     Name: Placement date: Placement time: Site: Days:    CVC Single Lumen - Broviac Left Subclavian 05/27/16      Subclavian   7    Peripheral IV - Right Forearm 06/01/16   1919   Forearm   1                 Labs: No results found for: PREALB, A1C  Recent Labs      06/01/16   0534  06/01/16   1908  06/02/16   0515  06/03/16   0552   NA  137  135*  134*  133*   K  3.8  3.9  4.2  3.4*   CL  94*  95*  95*  91*   BICARB  31*  28  27  31*   BUN  64*  62*  64*  62*   CREAT  1.60*  1.32*  1.45*  1.42*   GLU  122*  110*  82  134*   [place]  10.2  9.9  10.1  10.1   MG   --    --   2.3  2.3   PHOS   --    --   3.3  3.7   ALK  163*   --    --   174*   ALT  16   --    --   23   AST  23   --    --   29   TBILI  0.43   --    --   0.46   DBILI   --    --    --   <0.2   ALB  3.7   --    --   3.6       BS POCT: No results for input(s): GLUCPOCT in the last 72 hours.    Nutr Relevant MEDS:   Current Facility-Administered Medications   Medication    acetaminophen (TYLENOL) tablet 650 mg    docusate sodium (COLACE) capsule 250 mg     enoxaparin (LOVENOX) injection 40 mg    epoprostenol (FLOLAN) 60,000 ng/mL in glycine diluent 100 mL infusion    febuxostat (ULORIC) tablet 40 mg    ferrous sulfate EC tablet 599 mg    folic acid (FOLVITE) tablet 0.5 mg    furosemide (LASIX) tablet 80 mg    macitentan (OPSUMIT) tablet 10 mg    medroxyPROGESTERone (PROVERA) tablet 1.25 mg    metolazone (ZAROXOLYN) tablet 5 mg    nalOXone (NARCAN) injection 0.1 mg    potassium chloride (KLOR-CON) ER tablet 40 mEq    Prostacyclin Cassette Change    Prostacyclin Ice Pack    Prostacyclin Tubing    Prostacylin Batteries    sildenafil (REVATIO, VIAGRA) tablet 80 mg    sodium chloride (PF) 0.9 % flush 3 mL    sodium chloride (PF) 0.9 % flush 3 mL    sodium chloride 0.9 % TKO infusion    spironolactone (ALDACTONE) tablet 50 mg       Education: Discussed diarrhea nutrition therapy recommendations and provided handout.     Obtained food preferences. Pt requests no fish.    PLAN: Will continue to monitor PO  intake and diet tolerance. Will relay relevant, significant pt info and status change to Registered Dietitian.    Kevan Ny RD (DTR)

## 2016-06-03 NOTE — Plan of Care (Signed)
Problem: Fluid Volume Excess  Goal: Absence of edema; peripheral and/or pulmonary  Outcome: Progressing toward goal, anticipate improvement over: Next 24-48 hours  Pt is compliant with fluid intake. Daily weight. Lungs are clear with diminished bases per ascultation. Pt is on diuretic per MD orders with an increase today. Will continue to monitor and assess at bed side. Will continue to monitor. On room air and tolerating well. Denies any chest pain. Now on lasix PO and tolerating a lot better per pt states.    Problem: Breathing Pattern - Ineffective  Goal: Respiratory rate, rhythm and depth return to baseline  Outcome: Progressing toward goal, anticipate improvement over: Next 24-48 hours  Monitored breathing and lung sounds. Patient instructed to report difficulty breathing/shortness of breath. Assessed patient for signs or symptoms of hypoxia. Patient reports some dyspnea with exertion. See assessment for pulmonary details. No acute respiratory distress noted. On 3L nasal cannula and tolerating well. Pt is on flolan and tolerating well- increase rate per MD orders.    Problem: Tissue Perfusion, Cardiopulmonary - Altered  Goal: Hemodynamic stability  Outcome: Progressing toward goal, anticipate improvement over: >48 hours  Please see pts doc flow for vitals. No s/s of distress at this time. Encouraged pt to notify nurse if these symptoms arise.Tele monitor on and audible. Will continue to montior and assess at bedside. Will update PRN with any changes.

## 2016-06-04 LAB — MDIFF
Bands: 1 % (ref 0–15)
Myelocytes: 1 %
Number of Cells Counted: 115
Plt Est: DECREASED

## 2016-06-04 LAB — BASIC METABOLIC PANEL, BLOOD
Anion Gap: 13 mmol/L (ref 7–15)
BUN: 65 mg/dL — ABNORMAL HIGH (ref 8–23)
Bicarbonate: 29 mmol/L (ref 22–29)
Calcium: 10.2 mg/dL (ref 8.5–10.6)
Chloride: 91 mmol/L — ABNORMAL LOW (ref 98–107)
Creatinine: 1.51 mg/dL — ABNORMAL HIGH (ref 0.51–0.95)
GFR: 34 mL/min
Glucose: 90 mg/dL (ref 70–99)
Potassium: 3.2 mmol/L — ABNORMAL LOW (ref 3.5–5.1)
Sodium: 133 mmol/L — ABNORMAL LOW (ref 136–145)

## 2016-06-04 LAB — CBC WITH DIFF, BLOOD
ANC-Manual Mode: 3.8 10*3/uL (ref 1.6–7.0)
Abs Basophils: 0.1 10*3/uL (ref <0.1)
Abs Lymphs: 0.8 10*3/uL (ref 0.8–3.1)
Abs Monos: 0.1 10*3/uL — ABNORMAL LOW (ref 0.2–0.8)
Basophils: 2 %
Hct: 33.9 % — ABNORMAL LOW (ref 34.0–45.0)
Hgb: 10.1 gm/dL — ABNORMAL LOW (ref 11.2–15.7)
Lymphocytes: 17 %
MCH: 23.8 pg — ABNORMAL LOW (ref 26.0–32.0)
MCHC: 29.8 g/dL — ABNORMAL LOW (ref 32.0–36.0)
MCV: 80 um3 (ref 79.0–95.0)
Monocytes: 3 %
Plt Count: 80 10*3/uL — ABNORMAL LOW (ref 140–370)
RBC: 4.24 10*6/uL (ref 3.90–5.20)
RDW: 18 % — ABNORMAL HIGH (ref 12.0–14.0)
Segs: 76 %
WBC: 4.9 10*3/uL (ref 4.0–10.0)

## 2016-06-04 LAB — LIVER PANEL, BLOOD
ALT (SGPT): 20 U/L (ref 0–33)
AST (SGOT): 24 U/L (ref 0–32)
Albumin: 3.4 g/dL — ABNORMAL LOW (ref 3.5–5.2)
Alkaline Phos: 172 U/L — ABNORMAL HIGH (ref 35–140)
Bilirubin, Dir: <0.2 mg/dL (ref <0.2)
Bilirubin, Tot: 0.49 mg/dL (ref <1.2)
Total Protein: 7.3 g/dL (ref 6.0–8.0)

## 2016-06-04 LAB — MAGNESIUM, BLOOD: Magnesium: 2.3 mg/dL (ref 1.6–2.4)

## 2016-06-04 LAB — PHOSPHORUS, BLOOD: Phosphorous: 4.2 mg/dL (ref 2.7–4.5)

## 2016-06-04 MED ORDER — POTASSIUM CHLORIDE CRYS CR 10 MEQ OR TBCR
40.00 meq | EXTENDED_RELEASE_TABLET | Freq: Once | ORAL | Status: AC
Start: 2016-06-04 — End: 2016-06-04
  Administered 2016-06-04: 40 meq via ORAL
  Filled 2016-06-04: qty 4

## 2016-06-04 MED ORDER — VITAMIN B-12 500 MCG OR TABS
500.0000 ug | ORAL_TABLET | Freq: Every day | ORAL | Status: DC
Start: 2016-06-04 — End: 2016-06-21
  Administered 2016-06-04 – 2016-06-21 (×18): 500 ug via ORAL
  Filled 2016-06-04 (×19): qty 1

## 2016-06-04 MED ORDER — TORSEMIDE 20 MG OR TABS
80.0000 mg | ORAL_TABLET | Freq: Every day | ORAL | Status: DC
Start: 2016-06-04 — End: 2016-06-05
  Administered 2016-06-04: 80 mg via ORAL
  Filled 2016-06-04: qty 2

## 2016-06-04 MED ORDER — ONDANSETRON HCL 4 MG/2ML IV SOLN
4.0000 mg | Freq: Four times a day (QID) | INTRAMUSCULAR | Status: DC | PRN
Start: 2016-06-04 — End: 2016-06-21

## 2016-06-04 NOTE — Plan of Care (Signed)
Problem: Fluid Volume Excess  Goal: Absence of edema; peripheral and/or pulmonary  Outcome: Progressing toward goal, anticipate improvement over: >48 hours  Diuretic regimen changed by MD, administered as ordered. Pt has BLE edema, abdominal distension and some c/o SOB with movement. Will continue to monitor and intervene as needed.      Problem: Breathing Pattern - Ineffective  Goal: Respiratory rate, rhythm and depth return to baseline  Outcome: Progressing toward goal, anticipate improvement over: Next 24-48 hours  Pt in no acute respiratory distress, but does c/o some SOB with exertion. RR, rhythm and depth WNL. On supplemental O2 as needed to maintain saturations. Goal ongoing.    Problem: Tissue Perfusion, Cardiopulmonary - Altered  Goal: Hemodynamic stability  Outcome: Goal Met  VSS, pt alert and oriented, SR-ST on tele. Tissue flushed, warm and dry. Infusions as ordered.  Goal: Early recognition of deterioration  Outcome: Goal Met  Vitals signs checked q4hr and PRN, on continuous telemetry and hourly rounding performed.

## 2016-06-04 NOTE — Progress Notes (Signed)
Pulmonary Hypertension Service Progress Note    Date of Admission: 05/27/2016    Date of Service: 06/04/2016    Chief Complaint: "I was able to walk very far yesterday "    Subjective/HPI:   Still having dyspnea (including exertion) and lower extremity swelling. Denies chest pain. Denies fevers.     Interval Events:  - flolan dose increased to 35 ng/kg/min  - continued oral lasix at 100 po TID  - added metolazone     Pertinent Medications:  - flolan at 35 ng/kg/min    Current Facility-Administered Medications   Medication    acetaminophen (TYLENOL) tablet 650 mg    docusate sodium (COLACE) capsule 250 mg    enoxaparin (LOVENOX) injection 40 mg    epoprostenol (FLOLAN) 60,000 ng/mL in glycine diluent 100 mL infusion    febuxostat (ULORIC) tablet 40 mg    ferrous sulfate EC tablet 366 mg    folic acid (FOLVITE) tablet 0.5 mg    macitentan (OPSUMIT) tablet 10 mg    medroxyPROGESTERone (PROVERA) tablet 1.25 mg    metolazone (ZAROXOLYN) tablet 5 mg    nalOXone (NARCAN) injection 0.1 mg    potassium chloride (KLOR-CON) ER tablet 40 mEq    Prostacyclin Cassette Change    Prostacyclin Ice Pack    Prostacyclin Tubing    Prostacylin Batteries    sildenafil (REVATIO, VIAGRA) tablet 80 mg    sodium chloride (PF) 0.9 % flush 3 mL    sodium chloride (PF) 0.9 % flush 3 mL    sodium chloride 0.9 % TKO infusion    spironolactone (ALDACTONE) tablet 50 mg    torsemide (DEMADEX) tablet 80 mg     Review of Systems:  - Constitutional: denies fevers, chills and night sweats   - ENT: denies rhinorrhea, sore throat  - Cardiac: denies chest pain, palpitations; +edema   - Respiratory: +dyspnea, dry coughing (stable); denies hemoptysis  - GI: denies nausea, vomiting, abdominal pain or diarrhea; + abd distention   - GU: denies dysuria, hematuria   - Skin: + R wrist rash/swelling, improved; denies new ecchymoses   - Neuro: denies headaches, parasthesias or paresis   - MSK: denies myalgias or arthralgias      Objective  Temperature:  [98.2 F (36.8 C)-98.7 F (37.1 C)] 98.2 F (36.8 C) (05/23 1111)  Blood pressure (BP): (115-129)/(55-65) 129/58 (05/23 1111)  Heart Rate:  [98-107] 100 (05/23 1111)  Respirations:  [18-22] 20 (05/23 1111)  Pain Score: 0 (05/23 1111)  O2 Device: Nasal cannula (05/23 1111)  O2 Flow Rate (L/min):  [3 l/min] 3 l/min (05/23 1111)  SpO2:  [93 %-95 %] 93 % (05/23 1111)  I/O: 900/1800 = -900cc/hour  UOP of 1800cc/24 hours  Wt: 75.2 kg (70.4 kg, 5/21)    Physical Exam  General: chronically ill appearing female, laying in bed comfortably. Accompanied by her husband.   HEENT: NC/AT. PERRL, EOMI. Scler anicteric. Moist oral mucosa.   Neck: + JVD  CV: RRR, loud S2  Lungs: Good effort. Bibasilar crackles.   Abdomen: Soft, mild distention, NT, + BS. No guarding or rebound  Extremities:3+ LEedema, L slightly greater than right. Warm distal extremities.   Skin: R wrist swelling/erythema, improved   Neuro: CN II-XII grossly intact, strength grossly intact  Psych: flat affect     Labs  Recent Labs      06/02/16   0515  06/03/16   0552  06/04/16   0541   NA  134*  133*  133*  K  4.2  3.4*  3.2*   CL  95*  91*  91*   BICARB  27  31*  29   BUN  64*  62*  65*   CREAT  1.45*  1.42*  1.51*   [place]  10.1  10.1  10.2   MG  2.3  2.3  2.3   PHOS  3.3  3.7  4.2   TP   --   7.5  7.3   ALB   --   3.6  3.4*   TBILI   --   0.46  0.49   DBILI   --   <0.2  <0.2   AST   --   29  24   ALT   --   23  20   ALK   --   174*  172*       Recent Labs      06/03/16   0552  06/04/16   0541   WBC  4.2  4.9   HGB  10.2*  10.1*   HCT  33.9*  33.9*   MCV  80.1  80.0   PLT  82*  80*   SEG  71  76   LYMPHS  18  17   MONOS  8  3   EOS  1   --        No results for input(s): PTT, INR in the last 72 hours.    No results for input(s): CPK, [facility], TROPONIN in the last 72 hours.    No results for input(s): LACTATE in the last 72 hours.    Pertinent Micro  - none this admission     Antibiotics  - none     Pertinent Imaging  - CXR,  1V (05/27/2016):  - Radiology Impression:  Improved aeration in the left lung base.  No other change compared to 05/02/2016. Stable findings of pulmonary hypertension including cardiac enlargement hand enlargement of the central pulmonary arteries. Stable bilateral lung reticulation corresponding to pulmonary parenchymal findings of pulmonary hypertension as seen on prior CT. No definite superimposed consolidation.      - Abd U/S (05/29/2016):  Small amount of perihepatic ascites.  Cholelithiasis without evidence of acute cholecystitis.  Hepatosplenomegaly    - CT Chest w/o IVC (05/04/2016):  - Radiology Impression:  1. Severe enlargement of the pulmonary artery with increased calcification along the pulmonary arterial walls compatible with longstanding pulmonary hypertension. Progressed parenchymal changes of the lungs compatible with pulmonary hypertension.  2. Increased thin wall l cystic esions seen within the lungs, nonspecific be can be seen with underlying lymphocytic interstitial pneumonia, which can be seen in patients with lupus/Sjogren's disease.  3. Cardiomegaly with evidence of right heart dysfunction. Trace nonspecific pericardial fluid with pericardial thickening suspected. In a patient with known lupus underlying pericarditis/serositis may be present. Correlation with echocardiogram is recommended.  4. Likely reactive adenopathy of the thorax.  5. Additional findings as detailed above.  - Independently Reviewed:      Misc:  - Spirometry (05/10/2016):  - FEV1/FVC 63%  - FEV1 0.79 L (38%)  - FVC 1.24 L (47%)  - TLC 2.73 L (58%)  - RV 1.49 L (75%); RV/TLC 130% predicted  - DLCO 19.90 (60%)  - 2mt 410 meters (12/12/2015)      - TTE (05/12/2016):  1. The left ventricular size is normal and the left ventricular systolic function is normal.  2. The right ventricular size is moderately enlarged  and systolic function is reduced.  3. Moderate tricuspid regurgitation.  4. Severe pulmonary hypertension  with right ventricular systolic pressure measuring 70 mmHg.  5. Compared to prior study , PA pressures are slightly lower.    - RHC (05/27/2016):    - prior RHC done 02/2016 with PAP 97/35/58, PVR 584 D (7.30 WU), fCO/CI 6.85/3.9, tdCO/CI 6.17/3.51    ASSESSMENT AND PLAN  Diana Chambers is a 84 year [place] old female with SLE/Sjogren's and WHO Group I PAH who was admitted 5/15 after RHC was concerning for worsening hemodynamics. She has been on triple therapy including flolan. Her problem list is as follows:     # WHO Group I PAH with RV Overload: underlying PAH in the setting of prior anorexigen use and CTD (Sjogrens). RHC 5/15 with PAP 92/37/62, PAOP 12, tdPVR 9.28 WU and normal CO/CI. Currently on flolan 35ng/kg/min. Remains volume overloaded; not responding as well to oral diuresis  - continue diuresis -- switch to po torsemide 80 mg po daily (give at 2pm); continue spironolactone 50 mg po daily and metolazone 5 mg daily   - if UOP remains inadequate, will switch back to IV this evening   - continue triple therapy with PRA with flolan (currently at 35 ng/kg/min), ERA with opsumit 10 daily and PDE-I with sildenafil 80 TID  -- note, since admission the flolan dose has been increased and opsumit is a new medication for her     # Acute on chronic hypoxemic respiratory failure: secondary to underlying PAH with likely destruction of cap-alveolar membrane and subsequent diffusion impairment (Fick's Law). 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 Stable on 3 LPM  - continue supplemental O2 as needed  - continue treatment of PAH, as above  - will discuss inpatient sleep study with RT  - repeat CXR in the am     # ARF on CKD: baseline likely 1.1 - 1.2, currently 1.5. Likely cardiorenal  - continue treatment of PAH with triple therapy and  diuresis (as outlined above)  - monitor sCr and UOP  - avoid nephrotoxins and renally dose medications     # Hypokalemia: 2/2 diuresis and poor po intake. 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 Less likely [facility], [facility], [place]  - outpatient follow up with rheumatology and pulmonary  - will need repeat imaging down the line     # Anemia/Thrombocytopenia:ongoing, although stable lines this am. Appears to be a chronic proces, ? Bone marrow suppression. No sign of infectious process, TSH 3.49, Fe 26, Transferritin 191, ferritin 47; RBC folate >1240 B12 265 (5/22)  - repeat CBC in the am   - continue oral Fe  - start oral B12    # Elevated AP:? Congestive hepatopathy. LFT's stable this am   - continue treatment of R heart failure   - monitor LFT's    # Gout: last uric acid 8.6 (5/21), improved from 11.8 (4/24)  - continue uloric for now   - query R wrist pain/swelling as a gout flare -- defer treatment (steroids/NSAID's not a great option at this time)    # FEN:  - 2 gram Na+   - continue KCl 40 mEq daily     # PPx:  -  DVT: LMWH 40 daily; monitor renal function  - Stress Ulcer: none   - Bowels: docusate   - Prn: APAP, zofran    # Code:  - FC/FT     # Dispo:  - continue current care   - keep flolan at current dose     # Misc:  - am labs/imaging: ordered   - consults: none   - invasion: central access via L sided tunneled SC line   - point of contact: [name] (husband) - [phone]; at bedside and updated on the plan of care     This patient was seen and discussed with Dr. [name].    Alfonse Ras, M.D.  Fellow  Department of Pulmonary and Critical Care Medicine  [phone]        Pulmonary Vascular Attending Addendum:  Patient was examined with Dr. Candis Schatz, imaging and tests reviewed.  Please see above note for full details and plan.    66 yo F with WHO I PAH associated with CTD admitted following  RHC 5/15/18with acute on chronic right heart failure and volume overload.  -Trial of torsemide with metolazone.  If urine output is not robust will dose with additional IV lasix  -Follow daily weights  -Continue IV epo to35ng/kg/min - goal approx 40 ng.  -Continue sildenafil 80 mg TID  -Continue macitentan 10 mg PO QDAY (new start this admission).  -Continue uloric for gout  -LMWH for DVT prophylaxis  -Follow thrombocytopenia    [name] MD  Pager [phone]

## 2016-06-04 NOTE — Plan of Care (Signed)
Problem: Fluid Volume Excess  Goal: Absence of edema; peripheral and/or pulmonary  Outcome: Progressing toward goal, anticipate improvement over: next 12-24 hours  Pt has BLE edema, will monitor for worsening of edema and other s/s of fluid retention such as: labs and weight.       Problem: Breathing Pattern - Ineffective  Goal: Respiratory rate, rhythm and depth return to baseline  Outcome: Progressing toward goal, anticipate improvement over: next 12-24 hours  Respiratory rate, rhythm and depth are wnl. Will monitor respiratory status qshift, with vitals and as needed.       Problem: Tissue Perfusion, Cardiopulmonary - Altered  Goal: Hemodynamic stability  Outcome: Progressing toward goal, anticipate improvement over: next 12-24 hours  Pts blood pressure is wnl, pt is hemodynamically stable at this moment. Pt is also a flolan pt, will monitor for s/s of decreased tissue perfusion such as; decreased blood pressure, changes in mental status, cool/pale extremities, cyanosis, oliguria/anuria. Pt is on lasix, will montior intake and output.   Goal: Early recognition of deterioration  Outcome: Progressing toward goal, anticipate improvement over: next 12-24 hours  Will monitor for early signs of changes such as: changes in mental status, changes in blood pressure and cardiac rhythm.

## 2016-06-05 ENCOUNTER — Inpatient Hospital Stay (HOSPITAL_COMMUNITY): Payer: Medicare Other

## 2016-06-05 ENCOUNTER — Inpatient Hospital Stay (HOSPITAL_BASED_OUTPATIENT_CLINIC_OR_DEPARTMENT_OTHER): Admit: 2016-06-05 | Discharge: 2016-06-05 | Disposition: A | Discharge status: Home Health | Payer: Medicare Other

## 2016-06-05 DIAGNOSIS — I272 Pulmonary hypertension, unspecified: Secondary | ICD-10-CM

## 2016-06-05 DIAGNOSIS — J90 Pleural effusion, not elsewhere classified: Secondary | ICD-10-CM

## 2016-06-05 DIAGNOSIS — Z87891 Personal history of nicotine dependence: Secondary | ICD-10-CM

## 2016-06-05 DIAGNOSIS — E8770 Fluid overload, unspecified: Secondary | ICD-10-CM

## 2016-06-05 DIAGNOSIS — I517 Cardiomegaly: Secondary | ICD-10-CM

## 2016-06-05 DIAGNOSIS — J9811 Atelectasis: Secondary | ICD-10-CM

## 2016-06-05 DIAGNOSIS — Z452 Encounter for adjustment and management of vascular access device: Secondary | ICD-10-CM

## 2016-06-05 LAB — CBC WITH DIFF, BLOOD
ANC-Automated: 2.8 10*3/uL (ref 1.6–7.0)
Abs Lymphs: 1.1 10*3/uL (ref 0.8–3.1)
Abs Monos: 0.3 10*3/uL (ref 0.2–0.8)
Basophils: 1 %
Eosinophils: 1 %
Hct: 33.1 % — ABNORMAL LOW (ref 34.0–45.0)
Hgb: 10.3 gm/dL — ABNORMAL LOW (ref 11.2–15.7)
Imm Gran %: 3 % — ABNORMAL HIGH (ref <1)
Imm Gran Abs: 0.2 10*3/uL — ABNORMAL HIGH (ref <0.1)
Lymphocytes: 25 %
MCH: 24.7 pg — ABNORMAL LOW (ref 26.0–32.0)
MCHC: 31.1 g/dL — ABNORMAL LOW (ref 32.0–36.0)
MCV: 79.4 um3 (ref 79.0–95.0)
Monocytes: 6 %
Plt Count: 81 10*3/uL — ABNORMAL LOW (ref 140–370)
RBC: 4.17 10*6/uL (ref 3.90–5.20)
RDW: 18.1 % — ABNORMAL HIGH (ref 12.0–14.0)
Segs: 65 %
WBC: 4.4 10*3/uL (ref 4.0–10.0)

## 2016-06-05 LAB — BASIC METABOLIC PANEL, BLOOD
Anion Gap: 11 mmol/L (ref 7–15)
BUN: 74 mg/dL — ABNORMAL HIGH (ref 8–23)
Bicarbonate: 30 mmol/L — ABNORMAL HIGH (ref 22–29)
Calcium: 9.8 mg/dL (ref 8.5–10.6)
Chloride: 90 mmol/L — ABNORMAL LOW (ref 98–107)
Creatinine: 1.65 mg/dL — ABNORMAL HIGH (ref 0.51–0.95)
GFR: 31 mL/min
Glucose: 106 mg/dL — ABNORMAL HIGH (ref 70–99)
Potassium: 3.6 mmol/L (ref 3.5–5.1)
Sodium: 131 mmol/L — ABNORMAL LOW (ref 136–145)

## 2016-06-05 LAB — COMPREHENSIVE METABOLIC PANEL, BLOOD
ALT (SGPT): 20 U/L (ref 0–33)
AST (SGOT): 25 U/L (ref 0–32)
Albumin: 3.7 g/dL (ref 3.5–5.2)
Alkaline Phos: 189 U/L — ABNORMAL HIGH (ref 35–140)
Anion Gap: 13 mmol/L (ref 7–15)
BUN: 74 mg/dL — ABNORMAL HIGH (ref 8–23)
Bicarbonate: 29 mmol/L (ref 22–29)
Bilirubin, Tot: 0.51 mg/dL (ref <1.2)
Calcium: 10.2 mg/dL (ref 8.5–10.6)
Chloride: 89 mmol/L — ABNORMAL LOW (ref 98–107)
Creatinine: 1.6 mg/dL — ABNORMAL HIGH (ref 0.51–0.95)
GFR: 32 mL/min
Glucose: 133 mg/dL — ABNORMAL HIGH (ref 70–99)
Potassium: 3.3 mmol/L — ABNORMAL LOW (ref 3.5–5.1)
Sodium: 131 mmol/L — ABNORMAL LOW (ref 136–145)
Total Protein: 7.8 g/dL (ref 6.0–8.0)

## 2016-06-05 LAB — LIVER PANEL, BLOOD
ALT (SGPT): 20 U/L (ref 0–33)
AST (SGOT): 24 U/L (ref 0–32)
Albumin: 3.5 g/dL (ref 3.5–5.2)
Alkaline Phos: 173 U/L — ABNORMAL HIGH (ref 35–140)
Bilirubin, Dir: <0.2 mg/dL (ref <0.2)
Bilirubin, Tot: 0.39 mg/dL (ref <1.2)
Total Protein: 7.5 g/dL (ref 6.0–8.0)

## 2016-06-05 LAB — MAGNESIUM, BLOOD: Magnesium: 2.4 mg/dL (ref 1.6–2.4)

## 2016-06-05 LAB — PHOSPHORUS, BLOOD: Phosphorous: 4.2 mg/dL (ref 2.7–4.5)

## 2016-06-05 MED ORDER — DOPAMINE IN D5W 1.6-5 MG/ML-% IV SOLN
2.0000 ug/kg/min | INTRAVENOUS | Status: DC
Start: 2016-06-05 — End: 2016-06-08
  Administered 2016-06-05 – 2016-06-08 (×6): 2 ug/kg/min via INTRAVENOUS
  Filled 2016-06-05 (×2): qty 250

## 2016-06-05 MED ORDER — FUROSEMIDE 10 MG/ML IJ SOLN
80.0000 mg | Freq: Three times a day (TID) | INTRAMUSCULAR | Status: DC
Start: 2016-06-05 — End: 2016-06-05
  Administered 2016-06-05: 80 mg via INTRAVENOUS
  Filled 2016-06-05: qty 8

## 2016-06-05 MED ORDER — SODIUM CHLORIDE 0.9 % IV SOLN
100.0000 mg | Freq: Three times a day (TID) | INTRAVENOUS | Status: DC
Start: 2016-06-05 — End: 2016-06-07
  Administered 2016-06-05 – 2016-06-07 (×7): 100 mg via INTRAVENOUS
  Filled 2016-06-05 (×9): qty 10

## 2016-06-05 MED ORDER — POTASSIUM CHLORIDE CRYS CR 10 MEQ OR TBCR
40.00 meq | EXTENDED_RELEASE_TABLET | Freq: Once | ORAL | Status: AC
Start: 2016-06-05 — End: 2016-06-05
  Administered 2016-06-05: 40 meq via ORAL
  Filled 2016-06-05: qty 4

## 2016-06-05 MED ORDER — FUROSEMIDE 10 MG/ML IJ SOLN
100.0000 mg | Freq: Three times a day (TID) | INTRAMUSCULAR | Status: DC
Start: 2016-06-05 — End: 2016-06-05

## 2016-06-05 NOTE — Interdisciplinary (Signed)
06/05/16 1017   Follow Up/Progress   Is the Patient Ready for Discharge No   Barriers to Discharge Awaiting clinical improvement   Patient/Family/Legal/Surrogate Decision Maker Has Been Given Options And Choice In The Selection of Post-Acute Care Providers Yes   Plan/Interventions Explore needs and options for aftercare, provide referrals   Met with patient at the bedside. During this admission, her flolan dose has been increased and she was started on opsumit. Per husband, she is not ambulating as well, as she did before. Anticipating dc home when medically cleared. Pt has her pump and is able to manage her home Flolan.

## 2016-06-05 NOTE — Interdisciplinary (Signed)
MD paged to [phone]  Re: Pt Diana Chambers in rm [id number] Lab: K 3.3 tonight. Need replacement order pls. Thanks. Awaiting for order/call back.

## 2016-06-05 NOTE — Plan of Care (Signed)
Problem: Fluid Volume Excess  Goal: Absence of edema; peripheral and/or pulmonary  Outcome: Progressing toward goal, anticipate improvement over: >48 hours  Pt has BLE edema. Pt started of dopamine and furosemide, pls see MAR. Pt voiding freely. Goal not met.     Problem: Breathing Pattern - Ineffective  Goal: Respiratory rate, rhythm and depth return to baseline  Outcome: Progressing toward goal, anticipate improvement over: >48 hours   Assessed and monitored breathing status.  Pt lung sounds clear on upper bilateral lobes and crackles on bases. Pt is on O2 at 3L via n/c with O2 sats of 94-98%. Pt respirations even and nonlabored. Pt denies any cough or no sob noted. Will cont to monitor and assess for changes.     Problem: Tissue Perfusion, Cardiopulmonary - Altered  Goal: Hemodynamic stability  Outcome: Progressing toward goal, anticipate improvement over: >48 hours   Assessed and monitored cardiac status.  Pt is on tele monitor with heart rhythm of NSR,Blood pressure 124/59, pulse 103, temperature 98.3 F (36.8 C), resp. rate 18, height 5' 2"  (1.575 m), weight 75.4 kg (166 lb 3.6 oz), SpO2 94 %.   Pt is alert and oriented to person,place and time. Pt pulses palpable with BLE 2+ edema noted. Pt denies any chest pain or discomfort. Will cont to monitor and assess for changes.

## 2016-06-05 NOTE — Progress Notes (Signed)
Pulmonary Hypertension Service Progress Note    Date of Admission: 05/27/2016    Date of Service: 06/05/2016    Chief Complaint: "I wasn't able to walk as far yesterday"    Subjective/HPI:   States her exercise tolerance has declined over the last few days and her legs are more swollen. She also reports increasing pruritis of her legs. She denies any fevers, night sweats. Appetite still reduced. Denies abdominal pain or diarrhea.    Interval Events:  - trial of torsemide and addition of metolazone   - flolan dose left the same     Pertinent Medications:  - flolan at 35ng/kg/min    Current Facility-Administered Medications   Medication    acetaminophen (TYLENOL) tablet 650 mg    docusate sodium (COLACE) capsule 250 mg    DOPamine (INTROPIN) 400 mg/250 mL infusion (1600 mcg/mL)    enoxaparin (LOVENOX) injection 40 mg    epoprostenol (FLOLAN) 60,000 ng/mL in glycine diluent 100 mL infusion    febuxostat (ULORIC) tablet 40 mg    ferrous sulfate EC tablet 270 mg    folic acid (FOLVITE) tablet 0.5 mg    furosemide (LASIX) injection 80 mg    macitentan (OPSUMIT) tablet 10 mg    medroxyPROGESTERone (PROVERA) tablet 1.25 mg    nalOXone (NARCAN) injection 0.1 mg    ondansetron (ZOFRAN) injection 4 mg    potassium chloride (KLOR-CON) ER tablet 40 mEq    Prostacyclin Cassette Change    Prostacyclin Ice Pack    Prostacyclin Tubing    Prostacylin Batteries    sildenafil (REVATIO, VIAGRA) tablet 80 mg    sodium chloride (PF) 0.9 % flush 3 mL    sodium chloride (PF) 0.9 % flush 3 mL    sodium chloride 0.9 % TKO infusion    spironolactone (ALDACTONE) tablet 50 mg    vitamin b-12 (CYANOCOBALAMIN) tablet 500 mcg     Review of Systems:  - Constitutional: denies fevers, chills and night sweats   - ENT: denies rhinorrhea, sore throat  - Cardiac: denies chest pain, palpitations; +edema, ongoing   - Respiratory: +dyspnea and dry coughing (increasing); denies hemoptysis  - GI: denies nausea, vomiting,  abdominal pain or diarrhea; + abd distention   - GU: denies dysuria, hematuria   - Skin: + R wrist rash/swelling, improved; denies new ecchymoses   - Neuro: denies headaches, parasthesias or paresis   - MSK: denies myalgias or arthralgias     Objective  Temperature:  [98.2 F (36.8 C)-98.8 F (37.1 C)] 98.3 F (36.8 C) (05/24 0826)  Blood pressure (BP): (114-129)/(53-65) 124/59 (05/24 0826)  Heart Rate:  [96-105] 103 (05/24 0826)  Respirations:  [18-22] 18 (05/24 0826)  Pain Score: 0 (05/24 0826)  O2 Device: Nasal cannula (05/24 0425)  O2 Flow Rate (L/min):  [3 l/min] 3 l/min (05/24 0425)  SpO2:  [93 %-98 %] 94 % (05/24 0826)  I/O: 1640/1550= +90cc/hour  UOP of 1550cc/24 hours  Wt: 75.4 kg (70.2 kg, 5/23)    Physical Exam  General: sitting in the chair comfortably. Chronically ill appearing. Accompanied by her husband.   HEENT: NC/AT. PERRL, EOMI. Scler anicteric. Moist oral mucosa.   Neck: + JVD  CV: RRR, loud S2  Lungs: Good effort. Bibasilar crackles.   Abdomen: Soft, mild distention, NT, + BS. No guarding or rebound  Extremities:3-4+ LEedema. Warm distal extremities.   Skin: maculopapular rash over LE's, stable   Neuro: CN II-XII grossly intact, strength grossly intact  Psych: flat affect  Labs  Recent Labs      06/03/16   0552  06/04/16   0541  06/05/16   0549   NA  133*  133*  131*   K  3.4*  3.2*  3.6   CL  91*  91*  90*   BICARB  31*  29  30*   BUN  62*  65*  74*   CREAT  1.42*  1.51*  1.65*   [place]  10.1  10.2  9.8   MG  2.3  2.3  2.4   PHOS  3.7  4.2  4.2   TP  7.5  7.3  7.5   ALB  3.6  3.4*  3.5   TBILI  0.46  0.49  0.39   DBILI  <0.2  <0.2  <0.2   AST  29  24  24    ALT  23  20  20    ALK  174*  172*  173*       Recent Labs      06/03/16   0552  06/04/16   0541  06/05/16   0549   WBC  4.2  4.9  4.4   HGB  10.2*  10.1*  10.3*   HCT  33.9*  33.9*  33.1*   MCV  80.1  80.0  79.4   PLT  82*  80*  81*   SEG  71  76  65   LYMPHS  18  17  25    MONOS  8  3  6    EOS  1   --   1       No results for  input(s): PTT, INR in the last 72 hours.    No results for input(s): CPK, CKMBH, TROPONIN in the last 72 hours.    No results for input(s): LACTATE in the last 72 hours.    Pertinent Micro  - none this admission     Antibiotics  - none     Pertinent Imaging  - CXR, 1V (05/26/2016):  - Radiology Impression:  1. Stable position of the tunneled left transjugular central venous catheter terminating mid right atrium.  2. Grossly enlarged cardiac silhouette, marked dilation of the main and hilar pulmonary arteries. Query trace interstitial pulmonary edema.  3. No acute pulmonary consolidation. Minimal atelectasis right lung base.  4. Trace bilateral pleural effusions with no pneumothorax.  5. Severe pulmonary hypertension, secondary right heart, pulmonary artery enlargement.  - Independently Reviewed:      - Abd U/S (05/29/2016):  Small amount of perihepatic ascites.  Cholelithiasis without evidence of acute cholecystitis.  Hepatosplenomegaly    - CT Chest w/o IVC (05/04/2016):  - Radiology Impression:  1. Severe enlargement of the pulmonary artery with increased calcification along the pulmonary arterial walls compatible with longstanding pulmonary hypertension. Progressed parenchymal changes of the lungs compatible with pulmonary hypertension.  2. Increased thin wall l cystic esions seen within the lungs, nonspecific be can be seen with underlying lymphocytic interstitial pneumonia, which can be seen in patients with lupus/Sjogren's disease.  3. Cardiomegaly with evidence of right heart dysfunction. Trace nonspecific pericardial fluid with pericardial thickening suspected. In a patient with known lupus underlying pericarditis/serositis may be present. Correlation with echocardiogram is recommended.  4. Likely reactive adenopathy of the thorax.  5. Additional findings as detailed above.  - Independently Reviewed:      Misc:  - Spirometry (05/10/2016):  - FEV1/FVC 63%  - FEV1 0.79 L (38%)  -  FVC 1.24 L (47%)  - TLC  2.73 L (58%)  - RV 1.49 L (75%); RV/TLC 130% predicted  - DLCO 19.90 (60%)  - 35mt 410 meters (12/12/2015)      - TTE (05/12/2016):  1. The left ventricular size is normal and the left ventricular systolic function is normal.  2. The right ventricular size is moderately enlarged and systolic function is reduced.  3. Moderate tricuspid regurgitation.  4. Severe pulmonary hypertension with right ventricular systolic pressure measuring 70 mmHg.  5. Compared to prior study , PA pressures are slightly lower.    - RHC (05/27/2016):    - prior RHC done 02/2016 with PAP 97/35/58, PVR 584 D (7.30 WU), fCO/CI 6.85/3.9, tdCO/CI 6.17/3.51    ASSESSMENT AND PLAN  Diana Biekeris a 89year [place] with SLE/Sjogren's and WHO Group I PAH who was admitted 5/15 after RHC was concerning for worsening hemodynamics. She has been on triple therapy including flolan. Her problem list is as follows:     # WHO Group I PAH with RV Overload: underlying PAH in the setting of prior anorexigen use and CTD (Sjogrens). RHC 5/15 with PAP 92/37/62, PAOP 12, tdPVR 9.28 WU and normal CO/CI. Currently on flolan 35ng/kg/min. Remains volume overloaded; not responding as well to oral diuresis  - continue diuresis although switch back to IV lasix 80- IV q8h and stop metolazone for now; continue spironolactone 50 mg po daily  - continue triple therapy with PRA with flolan (currently at 35 ng/kg/min), ERA with opsumit 10 daily and PDE-I with sildenafil 80 TID  - start peripheral dopamine 2 mcg/kg/min  -- note, since admission the flolan dose has been increased and opsumit is a new medication for her     # Acute on chronic hypoxemic respiratory failure: secondary to underlying PAH with likely destruction of cap-alveolar membrane and subsequent diffusion impairment (Fick's Law). Likely some parenchymal involvement as well based on imaging which could be volume related versus DPLD in setting of underlying rheumatologic (CTD-ILD  including NSIP and cystic lung disease such as LIP) process. Stable on 3 LPM. CXR stable this am   - continue supplemental O2 as needed  - continue treatment of PAH, as above  - increasing diuresis today   - would benefit from sleep study     # ARF on CKD: baseline likely 1.1 - 1.2, currently 1.65. Likely cardiorenal with acute worsening due to poor response to diuresis  - continue treatment of PAH with triple therapy and increasing diuresis (as outlined above)  - monitor sCr and UOP  - avoid nephrotoxins and renally dose medications     # Hypokalemia: 2/2 diuresis and poor po intake. 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 Less likely [facility], [facility], [facility] - outpatient follow up with rheumatology and pulmonary  - will need repeat imaging down the line     # Anemia/Thrombocytopenia:ongoing, although stable lines this am. Appears to be a chronic proces, ? Bone marrow suppression. No sign of infectious process, TSH 3.49, Fe 26, Transferritin 191, ferritin 47; RBC folate >1240 B12 265 (5/22)  - repeat CBC in the am   - continue oral Fe and B12    # Elevated AAY:TKZSWFcongestive hepatopathy. LFT's stable this am   - continue treatment of R heart failure   - monitor LFT's    # Gout: last  uric acid 8.6 (5/21), improved from 11.8 (4/24)  - continue uloric for now     # FEN:  - 2 gram Na+   - continue KCl 40 mEq daily     # PPx:  - DVT: LMWH 40 daily; monitor renal function  - Stress Ulcer: none   - Bowels: docusate   - Prn: APAP, zofran    # Code:  - FC/FT     # Dispo:  - ongoing level of care  - start peripheral dopamine, continue flolan at current rate; increasing diuresis    # Misc:  - labs/imaging: ordered   - consults: none    - invasion: central access via L sided tunneled SC line   - point of contact: [name] (husband) - [phone]; at bedside and updated on the plan of care     This patient was seen and discussed  with Diana Chambers.    Diana Chambers, M.D.  Fellow  Department of Pulmonary and Critical Care Medicine  [phone]      Pulmonary Vascular Attending Addendum:  Patient was examined with Dr. Candis Chambers, imaging and tests reviewed.  Please see above note for full details and plan.    66 yo F with WHO I PAH associated with CTD admitted following RHC 5/15/18with acute on chronic right heart failure and volume overload.  - Failed trial of torsemide  -Cr elevated.  Will start dopamine 103mg peripheral for rv support  -IV lasix - goal net 2 L negative  -Follow daily weights  -Continue IV epo 35ng/kg/min - goal approx 40 ng.  -Continue sildenafil 80 mg TID  -Continue macitentan 10 mg PO QDAY (new start this admission).  -Continue uloric for gout  -LMWH for DVT prophylaxis  -Follow thrombocytopenia    DGeorgiann MohsMD  Pager [phone]

## 2016-06-06 LAB — CBC WITH DIFF, BLOOD
ANC-Manual Mode: 3.4 10*3/uL (ref 1.6–7.0)
Abs Lymphs: 0.9 10*3/uL (ref 0.8–3.1)
Abs Monos: 0.4 10*3/uL (ref 0.2–0.8)
Hct: 32.1 % — ABNORMAL LOW (ref 34.0–45.0)
Hgb: 10.2 gm/dL — ABNORMAL LOW (ref 11.2–15.7)
Lymphocytes: 19 %
MCH: 24.9 pg — ABNORMAL LOW (ref 26.0–32.0)
MCHC: 31.8 g/dL — ABNORMAL LOW (ref 32.0–36.0)
MCV: 78.5 um3 — ABNORMAL LOW (ref 79.0–95.0)
Monocytes: 8 %
Plt Count: 80 10*3/uL — ABNORMAL LOW (ref 140–370)
RBC: 4.09 10*6/uL (ref 3.90–5.20)
RDW: 17.8 % — ABNORMAL HIGH (ref 12.0–14.0)
Segs: 73 %
WBC: 4.6 10*3/uL (ref 4.0–10.0)

## 2016-06-06 LAB — MDIFF
Number of Cells Counted: 113
Plt Est: DECREASED

## 2016-06-06 LAB — URINALYSIS
Bilirubin: NEGATIVE
Blood: NEGATIVE
Glucose: NEGATIVE
Hyaline Cast: >5 — AB (ref 0–?)
Ketones: NEGATIVE
Leuk Esterase: NEGATIVE
Nitrite: NEGATIVE
Protein: NEGATIVE
Specific Gravity: 1.009 (ref 1.002–1.030)
Urobilinogen: NEGATIVE
pH: 5 (ref 5.0–8.0)

## 2016-06-06 LAB — BASIC METABOLIC PANEL, BLOOD
Anion Gap: 13 mmol/L (ref 7–15)
BUN: 76 mg/dL — ABNORMAL HIGH (ref 8–23)
Bicarbonate: 27 mmol/L (ref 22–29)
Calcium: 10 mg/dL (ref 8.5–10.6)
Chloride: 93 mmol/L — ABNORMAL LOW (ref 98–107)
Creatinine: 1.57 mg/dL — ABNORMAL HIGH (ref 0.51–0.95)
GFR: 33 mL/min
Glucose: 92 mg/dL (ref 70–99)
Potassium: 3.8 mmol/L (ref 3.5–5.1)
Sodium: 133 mmol/L — ABNORMAL LOW (ref 136–145)

## 2016-06-06 LAB — LIVER PANEL, BLOOD
ALT (SGPT): 19 U/L (ref 0–33)
AST (SGOT): 29 U/L (ref 0–32)
Albumin: 3.3 g/dL — ABNORMAL LOW (ref 3.5–5.2)
Alkaline Phos: 177 U/L — ABNORMAL HIGH (ref 35–140)
Bilirubin, Dir: <0.2 mg/dL (ref <0.2)
Bilirubin, Tot: 0.45 mg/dL (ref <1.2)
Total Protein: 7.4 g/dL (ref 6.0–8.0)

## 2016-06-06 LAB — MAGNESIUM, BLOOD: Magnesium: 2.5 mg/dL — ABNORMAL HIGH (ref 1.6–2.4)

## 2016-06-06 LAB — PHOSPHORUS, BLOOD: Phosphorous: 4.1 mg/dL (ref 2.7–4.5)

## 2016-06-06 MED ORDER — CHLOROTHIAZIDE SODIUM 500 MG IV SOLR
500.00 mg | Freq: Once | INTRAVENOUS | Status: AC
Start: 2016-06-06 — End: 2016-06-06
  Administered 2016-06-06: 500 mg via INTRAVENOUS
  Filled 2016-06-06: qty 18

## 2016-06-06 NOTE — Plan of Care (Signed)
Problem: Fluid Volume Excess  Goal: Absence of edema; peripheral and/or pulmonary  Outcome: Progressing toward goal, anticipate improvement over: >48 hours  2+ edema to BLE. Strict I/O's and daily wt. BLE elevated on a pillow while pt in bed. Will cont' to monitor and assess.     Problem: Breathing Pattern - Ineffective  Goal: Respiratory rate, rhythm and depth return to baseline  Outcome: Progressing toward goal, anticipate improvement over: >48 hours  Lungs crackles to lower lobes. Pt on O2 at 3 lpm via nc. See VS w/ saturation. Pt sob at rest. Flolan therapy per MD's order. See MAR. Skin flushed. Pt denies jaw pain, no c/o n/v/d. Ice packs changed as ordered. Back up CADD pump at bedside. Line patent and intact. No kinks. No alarms. Dosage sheet posted in pt's room. HOB > 30 degrees. Instructed pt to use I.S. Every 1-2 h x10. No respiratory distress noted. Instructed pt to call staff if c/o sob/distress. Pt verbalized understanding to instructions. Call bell within reach of pt. Will cont' to monitor and assess.     Problem: Tissue Perfusion, Cardiopulmonary - Altered  Goal: Hemodynamic stability  Outcome: Progressing toward goal, anticipate improvement over: >48 hours  Pt alert and oriented x4. Skin dry and warm to touch. 2+edema noted to BLE. Pt on tele w/ a heart rhythm of ST. Pt denies chest pain/pressure/palpitation. Pt on Dopamine drip per MD's order. See MAR. Pt able to move all extrem. Instructed pt to call staff if c/o chest pain/pressure/palpitation. Pt verbalized understanding to instructions. Strict I/O's and daily wt. Labs in am. See labs. Call bell within reach of pt. Will cont' to monitor and assess.   Goal: Early recognition of deterioration  Outcome: Progressing toward goal, anticipate improvement over: >48 hours

## 2016-06-06 NOTE — Plan of Care (Signed)
Problem: Fluid Volume Excess  Goal: Absence of edema; peripheral and/or pulmonary  Outcome: Progressing toward goal, anticipate improvement over: >48 hours  Pt has BLE edema. Pt on dopamine and furosemide, pls see MAR. Pt has foley cath for urinary retention and accurate I&O. Goal not met.     Problem: Breathing Pattern - Ineffective  Goal: Respiratory rate, rhythm and depth return to baseline  Outcome: Progressing toward goal, anticipate improvement over: >48 hours   Assessed and monitored breathing status.  Pt lung sounds clear on upper bilateral lobes and crackles on bases. Pt is on O2 at 3L via n/c with O2 sats of 94-98%. Pt respirations even and nonlabored. Pt denies any cough or no sob noted. Will cont to monitor and assess for changes.     Problem: Tissue Perfusion, Cardiopulmonary - Altered  Goal: Hemodynamic stability  Outcome: Progressing toward goal, anticipate improvement over: >48 hours   Assessed and monitored cardiac status.  Pt is on tele monitor with heart rhythm of NSR,Blood pressure 124/68, pulse 101, temperature 98.5 F (36.9 C), resp. rate 18, height _0  (1.575 m), weight 75.9 kg (167 lb 5.3 oz), SpO2 95 %.   Pt is alert and oriented to person,place and time. Pt pulses palpable with BLE 2+ edema noted. Pt denies any chest pain or discomfort. Will cont to monitor and assess for changes.

## 2016-06-06 NOTE — Progress Notes (Signed)
[place] NOTE  Current Hospital 10 Days 8 Hours    Medications:  Scheduled Meds   docusate sodium  250 mg Daily    enoxaparin  40 mg Daily    febuxostat  40 mg Daily    ferrous sulfate  387 mg Daily    folic acid  0.5 mg Daily    furosemide (LASIX) IVPB (doses > 80 mg)  100 mg Q8H    macitentan  10 mg Daily    medroxyPROGESTERone  1.25 mg Daily    potassium chloride  40 mEq Daily    Prostacyclin Cassette Change   Daily    Prostacyclin Ice Pack   Q6H    Prostacyclin Tubing   Once per day on Mon Wed Fri    Prostacylin Batteries   Once per day on Mon    sildenafil  80 mg TID    sodium chloride (PF)  3 mL Q8H    spironolactone  50 mg Daily    vitamin b-12  500 mcg Daily     PRN Meds   acetaminophen  650 mg Q4H PRN    nalOXone  0.1 mg Q2 Min PRN    ondansetron  4 mg Q6H PRN    sodium chloride (PF)  3 mL PRN    sodium chloride   Continuous PRN     IV Meds   DOPamine 2 mcg/kg/min (06/05/16 2000)    epoprostenol (FLOLAN) infusion 35 ng/kg/min (06/06/16 1411)    sodium chloride         Vitals:    Latest Entry  Range (last 24 hours)    Temperature: 98.5 F (36.9 C)  Temp  Avg: 98.4 F (36.9 C)  Min: 98.1 F (36.7 C)  Max: 98.9 F (37.2 C)    Blood pressure (BP): 124/68  BP  Min: 117/60  Max: 136/71    Heart Rate: 101  Pulse  Avg: 101.2  Min: 98  Max: 104    Respirations: 18  Resp  Avg: 18.7  Min: 18  Max: 20    SpO2: 95 %  SpO2  Avg: 93.7 %  Min: 93 %  Max: 95 %       No Data Recorded     Weight: 75.9 kg (167 lb 5.3 oz)  Percentage Weight Change (%): 0.66 %      Intake/Output Summary (Last 24 hours) at 06/06/16 1604  Last data filed at 06/06/16 1200   Gross per 24 hour   Intake            695.4 ml   Output             1950 ml   Net          -1254.6 ml       Exam:   Gen: NAD, lying in bed  NECK: + JVD  CV: RRR, + R no S3  RESP: Rales at bases bilat  ABD: soft, NT, softly distended, +BS  EXT: 2+ LE edema  NEURO: non-focal, grossly intact    Labs:   CBC  Recent Labs    06/04/16   0541  06/05/16   0549  06/06/16   0505   WBC  4.9  4.4  4.6   HGB  10.1*  10.3*  10.2*   HCT  33.9*  33.1*  32.1*   PLT  80*  81*  80*   BAND  1   --    --    SEG  76  65  73   LYMPHS  17  25  19    MONOS  3  6  8       Chemistry  Recent Labs      06/05/16   0549  06/05/16   1845  06/06/16   0505   NA  131*  131*  133*   K  3.6  3.3*  3.8   CL  90*  89*  93*   BICARB  30*  29  27   BUN  74*  74*  76*   CREAT  1.65*  1.60*  1.57*   GLU  106*  133*  92   [place]  9.8  10.2  10.0   MG  2.4   --   2.5*   PHOS  4.2   --   4.1     Recent Labs      06/05/16   0549  06/05/16   1845  06/06/16   0505   ALK  173*  189*  177*   AST  24  25  29    ALT  20  20  19    TBILI  0.39  0.51  0.45   DBILI  <0.2   --   <0.2   ALB  3.5  3.7  3.3*          ASSESSMENT/PLAN:  66 yo F with WHO I PAH associated with CTD admitted following RHC 5/15/18with acute on chronic right heart failure and volume overload.  -Cr mildly improved.  -continue dopamine 29mg peripheral for rv support  -IV lasix and diuril - goal net 2 L negative  -Follow daily weights  -Continue IV epo 35ng/kg/min - goal approx 40 ng.  -Continue sildenafil 80 mg TID  -Continue macitentan 10 mg PO QDAY (new start this admission).  -Continue uloric for gout  -LMWH for DVT prophylaxis  -Follow thrombocytopenia    DGeorgiann MohsM.D.  Pulmonary and Critical Care Attending  Pager [phone]

## 2016-06-07 ENCOUNTER — Inpatient Hospital Stay (HOSPITAL_BASED_OUTPATIENT_CLINIC_OR_DEPARTMENT_OTHER): Payer: Medicare Other

## 2016-06-07 DIAGNOSIS — I2721 Secondary pulmonary arterial hypertension: Secondary | ICD-10-CM

## 2016-06-07 DIAGNOSIS — K8020 Calculus of gallbladder without cholecystitis without obstruction: Secondary | ICD-10-CM

## 2016-06-07 DIAGNOSIS — E8770 Fluid overload, unspecified: Secondary | ICD-10-CM

## 2016-06-07 LAB — CBC WITH DIFF, BLOOD
ANC-Manual Mode: 3.6 10*3/uL (ref 1.6–7.0)
Abs Lymphs: 1 10*3/uL (ref 0.8–3.1)
Abs Monos: 0.1 10*3/uL — ABNORMAL LOW (ref 0.2–0.8)
Basophils: 1 %
Hct: 32.2 % — ABNORMAL LOW (ref 34.0–45.0)
Hgb: 10 gm/dL — ABNORMAL LOW (ref 11.2–15.7)
Lymphocytes: 20 %
MCH: 24.4 pg — ABNORMAL LOW (ref 26.0–32.0)
MCHC: 31.1 g/dL — ABNORMAL LOW (ref 32.0–36.0)
MCV: 78.5 um3 — ABNORMAL LOW (ref 79.0–95.0)
Monocytes: 3 %
Plt Count: 83 10*3/uL — ABNORMAL LOW (ref 140–370)
RBC: 4.1 10*6/uL (ref 3.90–5.20)
RDW: 18 % — ABNORMAL HIGH (ref 12.0–14.0)
Segs: 74 %
WBC: 4.8 10*3/uL (ref 4.0–10.0)

## 2016-06-07 LAB — BASIC METABOLIC PANEL, BLOOD
Anion Gap: 12 mmol/L (ref 7–15)
BUN: 82 mg/dL — ABNORMAL HIGH (ref 8–23)
Bicarbonate: 29 mmol/L (ref 22–29)
Calcium: 9.8 mg/dL (ref 8.5–10.6)
Chloride: 91 mmol/L — ABNORMAL LOW (ref 98–107)
Creatinine: 1.59 mg/dL — ABNORMAL HIGH (ref 0.51–0.95)
GFR: 32 mL/min
Glucose: 92 mg/dL (ref 70–99)
Potassium: 3.3 mmol/L — ABNORMAL LOW (ref 3.5–5.1)
Sodium: 132 mmol/L — ABNORMAL LOW (ref 136–145)

## 2016-06-07 LAB — MDIFF
Immature Granulocytes Absolute Manual: 0.1 10*3/uL (ref 0.0–0.1)
Myelocytes: 2 %
Number of Cells Counted: 114
Plt Est: DECREASED

## 2016-06-07 LAB — LIVER PANEL, BLOOD
ALT (SGPT): 20 U/L (ref 0–33)
AST (SGOT): 29 U/L (ref 0–32)
Albumin: 3.3 g/dL — ABNORMAL LOW (ref 3.5–5.2)
Alkaline Phos: 175 U/L — ABNORMAL HIGH (ref 35–140)
Bilirubin, Dir: <0.2 mg/dL (ref <0.2)
Bilirubin, Tot: 0.42 mg/dL (ref <1.2)
Total Protein: 7.5 g/dL (ref 6.0–8.0)

## 2016-06-07 LAB — MAGNESIUM, BLOOD: Magnesium: 2.5 mg/dL — ABNORMAL HIGH (ref 1.6–2.4)

## 2016-06-07 LAB — PHOSPHORUS, BLOOD: Phosphorous: 4.1 mg/dL (ref 2.7–4.5)

## 2016-06-07 MED ORDER — CHLOROTHIAZIDE SODIUM 500 MG IV SOLR
500.00 mg | Freq: Once | INTRAVENOUS | Status: AC
Start: 2016-06-07 — End: 2016-06-07
  Administered 2016-06-07: 500 mg via INTRAVENOUS
  Filled 2016-06-07: qty 18

## 2016-06-07 MED ORDER — SODIUM CHLORIDE 0.9 % IV SOLN
100.0000 mg | Freq: Four times a day (QID) | INTRAVENOUS | Status: DC
Start: 2016-06-07 — End: 2016-06-09
  Administered 2016-06-07 – 2016-06-09 (×7): 100 mg via INTRAVENOUS
  Filled 2016-06-07 (×8): qty 10

## 2016-06-07 NOTE — Plan of Care (Signed)
Problem: Fluid Volume Excess  Goal: Absence of edema; peripheral and/or pulmonary  Outcome: Unable to meet goal at this time.  Pt has crackles to bilateral lung bases, 2+ BLE edema, and abdominal distention which pt and husband feel has worsened from yesterday. Pt and husband with a lot of questions about fluid overload and why it seems to not be improving. Explained disease process of pulmonary hypertension and heart failure to help explain. Receiving IV lasix q8h, on dopamine gtt and continuous flolan infusion.     Problem: Breathing Pattern - Ineffective  Goal: Respiratory rate, rhythm and depth return to baseline  Outcome: Unable to meet goal at this time.  Pt on 3L/NC with O2 sat 92%, pt's baseline 2L at home. Discussed CXR result from 5/24 (most recent) with pt and husband, encouraged use of IS.    Problem: Tissue Perfusion, Cardiopulmonary - Altered  Goal: Hemodynamic stability  Outcome: Unable to meet goal at this time.  NSR on monitor with 1st deg AVB, HR 90s-100s. BLE edema and abdominal distention noted, encouraged to keep legs elevated while out of bed to chair. VSS.

## 2016-06-07 NOTE — Progress Notes (Signed)
[facility] 11 Days 3 Hours  Subjective:   Improved today but edema persists.    Medications:  Scheduled Meds   docusate sodium  250 mg Daily    enoxaparin  40 mg Daily    febuxostat  40 mg Daily    ferrous sulfate  275 mg Daily    folic acid  0.5 mg Daily    furosemide (LASIX) IVPB (doses > 80 mg)  100 mg Q8H    macitentan  10 mg Daily    medroxyPROGESTERone  1.25 mg Daily    potassium chloride  40 mEq Daily    Prostacyclin Cassette Change   Daily    Prostacyclin Ice Pack   Q6H    Prostacyclin Tubing   Once per day on Mon Wed Fri    Prostacylin Batteries   Once per day on Mon    sildenafil  80 mg TID    sodium chloride (PF)  3 mL Q8H    spironolactone  50 mg Daily    vitamin b-12  500 mcg Daily     PRN Meds   acetaminophen  650 mg Q4H PRN    nalOXone  0.1 mg Q2 Min PRN    ondansetron  4 mg Q6H PRN    sodium chloride (PF)  3 mL PRN    sodium chloride   Continuous PRN     IV Meds   DOPamine 2 mcg/kg/min (06/06/16 2300)    epoprostenol (FLOLAN) infusion 35 ng/kg/min (06/06/16 1411)    sodium chloride         Vitals:    Latest Entry  Range (last 24 hours)    Temperature: 98.7 F (37.1 C)  Temp  Avg: 98.4 F (36.9 C)  Min: 97.5 F (36.4 C)  Max: 98.7 F (37.1 C)    Blood pressure (BP): 128/66  BP  Min: 112/60  Max: 128/66    Heart Rate: 96  Pulse  Avg: 102  Min: 96  Max: 105    Respirations: 20  Resp  Avg: 18.7  Min: 16  Max: 20    SpO2: 92 %  SpO2  Avg: 94.3 %  Min: 92 %  Max: 96 %       No Data Recorded     Weight: 76.3 kg (168 lb 3.4 oz)  Percentage Weight Change (%): 0.53 %         Intake/Output Summary (Last 24 hours) at 06/07/16 1045  Last data filed at 06/07/16 0800   Gross per 24 hour   Intake            651.2 ml   Output             2200 ml   Net          -1548.8 ml       Exam:   Gen: NAD, lying in bed  NECK: + JVD  CV: RRR, + R no S3  RESP: Rales at bases bilat  ABD: firmly distended, +BS  EXT: 2+ LE edema  NEURO: non-focal, grossly  intact    Labs:   CBC  Recent Labs      06/06/16   0505  06/07/16   0600   WBC  4.6  4.8   HGB  10.2*  10.0*   HCT  32.1*  32.2*   PLT  80*  83*   SEG  73  74   LYMPHS  19  20  MONOS  8  3      Chemistry  Recent Labs      06/06/16   0505  06/07/16   0600   NA  133*  132*   K  3.8  3.3*   CL  93*  91*   BICARB  27  29   BUN  76*  82*   CREAT  1.57*  1.59*   GLU  92  92   [place]  10.0  9.8   MG  2.5*  2.5*   PHOS  4.1  4.1     Recent Labs      06/06/16   0505  06/07/16   0600   ALK  177*  175*   AST  29  29   ALT  19  20   TBILI  0.45  0.42   DBILI  <0.2  <0.2   ALB  3.3*  3.3*       ASSESSMENT/PLAN:  66 yo F with WHO I PAH associated with CTD admitted following RHC 5/15/18with acute on chronic right heart failure and volume overload.  -Cr stable.  BUN continues to rise  -Continue dopamine 65mg peripheral for rv support  -IV lasix - goal net 2 L negative  -Check CT ob abdomen to look for evidence of abdominal mass/uterine fibroid or other causing IVC compression causing recalcitrant LW edema.Prior abdom UKoreashowed hepatomegaly, splenomegaly with minimal ascites.   -Follow daily weights  -Continue IV epo 35ng/kg/min.  -Continue sildenafil 80 mg TID  -Continue macitentan 10 mg PO QDAY (new start this admission).  -Continue uloric for gout  -LMWH for DVT prophylaxis  -Follow thrombocytopenia    DGeorgiann MohsM.D.  Pulmonary and Critical Care Attending  Pager [phone]

## 2016-06-08 ENCOUNTER — Other Ambulatory Visit (HOSPITAL_COMMUNITY): Payer: Self-pay | Admitting: Student in an Organized Health Care Education/Training Program

## 2016-06-08 LAB — CBC WITH DIFF, BLOOD
ANC-Automated: 3.5 10*3/uL (ref 1.6–7.0)
Abs Lymphs: 0.8 10*3/uL (ref 0.8–3.1)
Abs Monos: 0.3 10*3/uL (ref 0.2–0.8)
Hct: 31.4 % — ABNORMAL LOW (ref 34.0–45.0)
Hgb: 9.6 gm/dL — ABNORMAL LOW (ref 11.2–15.7)
Imm Gran %: 4 % — ABNORMAL HIGH (ref <1)
Imm Gran Abs: 0.2 10*3/uL — ABNORMAL HIGH (ref <0.1)
Lymphocytes: 17 %
MCH: 24.2 pg — ABNORMAL LOW (ref 26.0–32.0)
MCHC: 30.6 g/dL — ABNORMAL LOW (ref 32.0–36.0)
MCV: 79.3 um3 (ref 79.0–95.0)
MPV: 9.6 fL (ref 9.4–12.4)
Monocytes: 6 %
Plt Count: 76 10*3/uL — ABNORMAL LOW (ref 140–370)
RBC: 3.96 10*6/uL (ref 3.90–5.20)
RDW: 17.6 % — ABNORMAL HIGH (ref 12.0–14.0)
Segs: 72 %
WBC: 4.8 10*3/uL (ref 4.0–10.0)

## 2016-06-08 LAB — BASIC METABOLIC PANEL, BLOOD
Anion Gap: 14 mmol/L (ref 7–15)
BUN: 88 mg/dL — ABNORMAL HIGH (ref 8–23)
Bicarbonate: 26 mmol/L (ref 22–29)
Calcium: 9.8 mg/dL (ref 8.5–10.6)
Chloride: 89 mmol/L — ABNORMAL LOW (ref 98–107)
Creatinine: 1.38 mg/dL — ABNORMAL HIGH (ref 0.51–0.95)
GFR: 38 mL/min
Glucose: 106 mg/dL — ABNORMAL HIGH (ref 70–99)
Potassium: 2.9 mmol/L — ABNORMAL LOW (ref 3.5–5.1)
Sodium: 129 mmol/L — ABNORMAL LOW (ref 136–145)

## 2016-06-08 LAB — SED RATE, BLOOD: Sed Rate: 33 mm/hr — ABNORMAL HIGH (ref 0–30)

## 2016-06-08 LAB — C3, BLOOD: C3: 106 mg/dL (ref 90–180)

## 2016-06-08 LAB — CRP HS, BLOOD: CRP HS: 5.6 mg/L — ABNORMAL HIGH (ref 0.0–4.9)

## 2016-06-08 LAB — C4, BLOOD: C4: 17 mg/dL (ref 10–40)

## 2016-06-08 LAB — PRO BNP, BLOOD: BNPP: 18649 pg/mL — ABNORMAL HIGH (ref 0–899)

## 2016-06-08 LAB — POTASSIUM, BLOOD: Potassium: 4.2 mmol/L (ref 3.5–5.1)

## 2016-06-08 MED ORDER — DIGOXIN 0.125 MG OR TABS
250.0000 ug | ORAL_TABLET | Freq: Every evening | ORAL | Status: DC
Start: 2016-06-08 — End: 2016-06-21
  Administered 2016-06-08 – 2016-06-20 (×13): 250 ug via ORAL
  Filled 2016-06-08 (×14): qty 2

## 2016-06-08 MED ORDER — POTASSIUM CHLORIDE CRYS CR 10 MEQ OR TBCR
40.00 meq | EXTENDED_RELEASE_TABLET | ORAL | Status: AC
Start: 2016-06-08 — End: 2016-06-08
  Administered 2016-06-08 (×2): 40 meq via ORAL
  Filled 2016-06-08 (×2): qty 4

## 2016-06-08 NOTE — Progress Notes (Signed)
[place] NOTE  Current Hospital 12 Days 3 Hours    Medications:  Scheduled Meds   digoxin  250 mcg QPM    docusate sodium  250 mg Daily    enoxaparin  40 mg Daily    febuxostat  40 mg Daily    ferrous sulfate  409 mg Daily    folic acid  0.5 mg Daily    furosemide (LASIX) IVPB (doses > 80 mg)  100 mg Q6H    medroxyPROGESTERone  1.25 mg Daily    potassium chloride  40 mEq Daily    Prostacyclin Cassette Change   Daily    Prostacyclin Ice Pack   Q6H    Prostacyclin Tubing   Once per day on Mon Wed Fri    Prostacylin Batteries   Once per day on Mon    sildenafil  80 mg TID    sodium chloride (PF)  3 mL Q8H    spironolactone  50 mg Daily    vitamin b-12  500 mcg Daily     PRN Meds   acetaminophen  650 mg Q4H PRN    nalOXone  0.1 mg Q2 Min PRN    ondansetron  4 mg Q6H PRN    sodium chloride (PF)  3 mL PRN    sodium chloride   Continuous PRN     IV Meds   epoprostenol (FLOLAN) infusion 35 ng/kg/min (06/08/16 0400)    sodium chloride         Vitals:    Latest Entry  Range (last 24 hours)    Temperature: 98.3 F (36.8 C)  Temp  Avg: 98.4 F (36.9 C)  Min: 98.3 F (36.8 C)  Max: 98.7 F (37.1 C)    Blood pressure (BP): 117/64  BP  Min: 112/49  Max: 126/66    Heart Rate: 98  Pulse  Avg: 100.4  Min: 98  Max: 102    Respirations: 18  Resp  Avg: 19.8  Min: 18  Max: 22    SpO2: 94 %  SpO2  Avg: 93 %  Min: 92 %  Max: 94 %       No Data Recorded     Weight: 76.4 kg (168 lb 6.9 oz)  Percentage Weight Change (%): 0.13 %    Intake/Output Summary (Last 24 hours) at 06/08/16 1050  Last data filed at 06/08/16 1014   Gross per 24 hour   Intake           632.04 ml   Output             1635 ml   Net         -1002.96 ml     Exam:  Gen: NAD, lying in bed  NECK: + JVD  CV: RRR, + TR murmur  RESP: Rales at bases bilat  ABD: firmly distended, +BS  EXT: 2+ LE edema  NEURO: non-focal, grossly intact      Labs:   CBC  Recent Labs      06/07/16   0600  06/08/16   0555   WBC  4.8  4.8   HGB  10.0*   9.6*   HCT  32.2*  31.4*   PLT  83*  76*   SEG  74  72   LYMPHS  20  17   MONOS  3  6      Chemistry  Recent Labs      06/06/16   0505  06/07/16  0600  06/08/16   0555   NA  133*  132*  129*   K  3.8  3.3*  2.9*   CL  93*  91*  89*   BICARB  27  29  26    BUN  76*  82*  88*   CREAT  1.57*  1.59*  1.38*   GLU  92  92  106*   [place]  10.0  9.8  9.8   MG  2.5*  2.5*   --    PHOS  4.1  4.1   --      Recent Labs      06/06/16   0505  06/07/16   0600   ALK  177*  175*   AST  29  29   ALT  19  20   TBILI  0.45  0.42   DBILI  <0.2  <0.2   ALB  3.3*  3.3*        ASSESSMENT/PLAN:  67 yo F with WHO I PAH associated with CTD admitted following RHC 5/15/18with acute on chronic right heart failure and volume overload.  -Cr improved but BUN continues to rise  -D/C dopamine  -IV lasix  -Abdominal CT showed anasarca  -d/c macitentan  -Check C3,C4 ESR, CRP, DS DNA  -check 24 hour urine protein and Cr  -Resume prior home digoxin 250 mcg PO QDAY  -Continue IV epo 35ng/kg/min.  -Continue sildenafil 80 mg TID  -Continue uloric for gout  -LMWH for DVT prophylaxis  -Follow thrombocytopenia    [name] M.D.  Pulmonary and Critical Care Attending  Pager [phone]

## 2016-06-08 NOTE — Plan of Care (Signed)
Problem: Fluid Volume Excess  Goal: Absence of edema; peripheral and/or pulmonary  Outcome: Unable to meet goal at this time.  Patient has +3 edema to BLE, patient educated on elevating legs while in bed or sitting in the chair. Pt currently on lasix gtt every 6 hours as ordered, foley in place for retention and monitoring output. Crackles noted upon auscultation to lower lobes.     Problem: Breathing Pattern - Ineffective  Goal: Respiratory rate, rhythm and depth return to baseline  Outcome: Progressing toward goal, anticipate improvement over: >48 hours  Patient currently on 3L via NC, dyspneic on exertion. Pt encouraged to take breaks when getting ready to ambulate.     Problem: Tissue Perfusion, Cardiopulmonary - Altered  Goal: Hemodynamic stability  Outcome: Progressing toward goal, anticipate improvement over: >48 hours  Patient under no acute distress, NSR on tele monitor, denies chest pain. Pt currently on Dopamine gtt per MD order, continues with Lasix gtt q6h, strict I&O, daily weights.

## 2016-06-08 NOTE — Interdisciplinary (Signed)
Pt unable to perform Sleep Study. Pt has a constant supplemental oxygen requirement. Pt was did a brief trial with venturi mask in order to get her the oxygen needed during the sleep study. This would be the modality used during the sleep study. Pt was unable to tolerate mask and the sleep study was cancelled for the night. Thanks
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
## 2016-06-08 NOTE — Plan of Care (Signed)
Problem: Fluid Volume Excess  Goal: Absence of edema; peripheral and/or pulmonary  Outcome: Unable to meet goal at this time.  Significant peripheral edema present, coarse crackles and wet sounding bases of lungs. DOE noted, minimal dyspnea even at rest. Diuretics administered as ordered with poor urine output. Goal ongoing.    Problem: Breathing Pattern - Ineffective  Goal: Respiratory rate, rhythm and depth return to baseline  Outcome: Progressing toward goal, anticipate improvement over: Next 24-48 hours  RR and rhythm WNL, wet sounding bases of lungs. Supplemental O2 to maintain adequate sats. Will continue to monitor and intervene as needed.      Problem: Tissue Perfusion, Cardiopulmonary - Altered  Goal: Hemodynamic stability  Outcome: Goal Met  Flolan infusing as ordered. VSS stable, pt alert and oriented, NSR on tele.

## 2016-06-08 NOTE — Progress Notes (Signed)
Opened for medication reconciliation

## 2016-06-09 LAB — COMPREHENSIVE METABOLIC PANEL, BLOOD
ALT (SGPT): 17 U/L (ref 0–33)
AST (SGOT): 21 U/L (ref 0–32)
Albumin: 3.4 g/dL — ABNORMAL LOW (ref 3.5–5.2)
Alkaline Phos: 167 U/L — ABNORMAL HIGH (ref 35–140)
Anion Gap: 12 mmol/L (ref 7–15)
BUN: 91 mg/dL — ABNORMAL HIGH (ref 8–23)
Bicarbonate: 31 mmol/L — ABNORMAL HIGH (ref 22–29)
Bilirubin, Tot: 0.37 mg/dL (ref <1.2)
Calcium: 10 mg/dL (ref 8.5–10.6)
Chloride: 91 mmol/L — ABNORMAL LOW (ref 98–107)
Creatinine: 1.78 mg/dL — ABNORMAL HIGH (ref 0.51–0.95)
GFR: 28 mL/min
Glucose: 93 mg/dL (ref 70–99)
Potassium: 3.6 mmol/L (ref 3.5–5.1)
Sodium: 134 mmol/L — ABNORMAL LOW (ref 136–145)
Total Protein: 7.2 g/dL (ref 6.0–8.0)

## 2016-06-09 LAB — URINALYSIS
Bilirubin: NEGATIVE
Glucose: NEGATIVE
Hyaline Cast: >5 — AB (ref 0–?)
Ketones: NEGATIVE
Nitrite: NEGATIVE
RBC: >50 — AB (ref 0–?)
Specific Gravity: 1.011 (ref 1.002–1.030)
Urobilinogen: NEGATIVE
WBC: >50 — AB (ref 0–?)
pH: 6 (ref 5.0–8.0)

## 2016-06-09 LAB — 24 HOUR URINE CREATININE
Creatinine Concentration: 49 mg/dL (ref 29–226)
Creatinine Total 24hr: 745 mg/24 hr (ref 720–1510)
Duration: 24 hours (ref 0–48)
Volume: 1520 mL (ref 600–1600)

## 2016-06-09 LAB — 24 HOUR URINE TOTAL PROTEIN
Duration: 24 hours (ref 0–48)
Protein Concentration: 59 mg/dL
Protein Total 24hr: 897 mg/24 hr (ref <150)
Volume: 1520 mL (ref 600–1600)

## 2016-06-09 MED ORDER — METHYLPREDNISOLONE SODIUM SUCC 40 MG IJ SOLR CUSTOM
60.0000 mg | Freq: Every day | INTRAMUSCULAR | Status: DC
Start: 2016-06-09 — End: 2016-06-15
  Administered 2016-06-09 – 2016-06-15 (×7): 60 mg via INTRAVENOUS
  Filled 2016-06-09 (×7): qty 80

## 2016-06-09 MED ORDER — SODIUM CHLORIDE 0.9 % IV SOLN
100.0000 mg | Freq: Three times a day (TID) | INTRAVENOUS | Status: DC
Start: 2016-06-09 — End: 2016-06-17
  Administered 2016-06-09 – 2016-06-17 (×24): 100 mg via INTRAVENOUS
  Filled 2016-06-09 (×25): qty 10

## 2016-06-09 NOTE — Plan of Care (Addendum)
Problem: Alteration in comfort related to pain  Goal: Patient will verbalize or exhibit feelings of comfort and/or decrease in pain, vital signs will remain within parameters ordered by physician/ provider  Outcome: Unable to meet goal at this time.  Pt reports pain in LE due to edema   Comfort measures with some relief       Problem: Fluid Volume Excess  Goal: Absence of edema; peripheral and/or pulmonary  Outcome: Unable to meet goal at this time.  Pt taking in very sm amounts of water/ice chips  Urine outputs maintaining average 60 cc /o  Pt continues on 24 urine collection      Problem: Breathing Pattern - Ineffective  Goal: Respiratory rate, rhythm and depth return to baseline  Outcome: Unable to meet goal at this time.  Pt is using accessory muscles at all times  Sob with any activity      sats are maintained mid to uppper 90'2    Problem: Tissue Perfusion, Cardiopulmonary - Altered  Goal: Hemodynamic stability  Outcome: Unable to meet goal at this time.  VSS  Pt off dopamine gtt today

## 2016-06-09 NOTE — Plan of Care (Signed)
Problem: Fluid Volume Excess  Goal: Absence of edema; peripheral and/or pulmonary  Outcome: Progressing toward goal, anticipate improvement over: next 12-24 hours  Edema noted to BLE +3 and up to abdomen. Continuing intermittent lasix gtt, aldactone, strict I&O's and daily weights. Weight gain this AM, 1500 ml fluid restriction- keeping track on patient's white board in the room, compliant. Foley in for accurate output monitoring. Rales noted in lower lobes, pt dyspneic on exertion. Continuing to mtr closely for changes.     Problem: Breathing Pattern - Ineffective  Goal: Respiratory rate, rhythm and depth return to baseline  Outcome: Progressing toward goal, anticipate improvement over: next 12-24 hours  Respirations labored on exertion- lung fields with crackles/ rales in lower lobes. Tachypnea at times. IV solumedrol started today, 46m daily. Continuing flolan at 779m24 hrs. Tubing/ cassette/ batteries changed, line free from kinks- site CD&I without redness- safety checks performed. On 3L NC, SpO2 stable, using I.S. Appropriately. Will continue to mtr for respiratory changes.     Problem: Tissue Perfusion, Cardiopulmonary - Altered  Goal: Hemodynamic stability  Outcome: Goal Met  SR with 1st degree AVB on telemetry. Pt denies CP/palpitations/distress. HR 90-low 100's. Map 80's. Will continue to mtr for changes.

## 2016-06-09 NOTE — Plan of Care (Signed)
Problem: Fluid Volume Excess  Goal: Absence of edema; peripheral and/or pulmonary  Outcome: Progressing toward goal, anticipate improvement over: >48 hours  I & O stick with fluid restriction daily wt and labs  Pt tolerating well     Problem: Tissue Perfusion, Cardiopulmonary - Altered  Goal: Hemodynamic stability  Outcome: Progressing toward goal, anticipate improvement over: >48 hours  Continues on lasix tid and strick I & O   vss   Goal: Early recognition of deterioration    Intervention: Monitor electrolytes within specified parameters  Continue to monitor daily labs

## 2016-06-09 NOTE — Progress Notes (Signed)
[facility] 13 Days 4 Hours    Medications:  Scheduled Meds   digoxin  250 mcg QPM    docusate sodium  250 mg Daily    enoxaparin  40 mg Daily    febuxostat  40 mg Daily    ferrous sulfate  332 mg Daily    folic acid  0.5 mg Daily    furosemide (LASIX) IVPB (doses > 80 mg)  100 mg Q8H    medroxyPROGESTERone  1.25 mg Daily    methylPREDNISolone sodium succinate  60 mg Daily    potassium chloride  40 mEq Daily    Prostacyclin Cassette Change   Daily    Prostacyclin Ice Pack   Q6H    Prostacyclin Tubing   Once per day on Mon Wed Fri    Prostacylin Batteries   Once per day on Mon    sildenafil  80 mg TID    sodium chloride (PF)  3 mL Q8H    spironolactone  50 mg Daily    vitamin b-12  500 mcg Daily     PRN Meds   acetaminophen  650 mg Q4H PRN    nalOXone  0.1 mg Q2 Min PRN    ondansetron  4 mg Q6H PRN    sodium chloride (PF)  3 mL PRN    sodium chloride   Continuous PRN     IV Meds   epoprostenol (FLOLAN) infusion 35 ng/kg/min (06/09/16 1100)    sodium chloride         Vitals:    Latest Entry  Range (last 24 hours)    Temperature: 98.2 F (36.8 C)  Temp  Avg: 98.4 F (36.9 C)  Min: 97.6 F (36.4 C)  Max: 99.2 F (37.3 C)    Blood pressure (BP): 132/57  BP  Min: 115/61  Max: 138/59    Heart Rate: 94  Pulse  Avg: 98.3  Min: 94  Max: 102    Respirations: 18  Resp  Avg: 19  Min: 18  Max: 22    SpO2: 96 %  SpO2  Avg: 94.4 %  Min: 93 %  Max: 96 %       No Data Recorded     Weight: 76.9 kg (169 lb 8.5 oz)  Percentage Weight Change (%): 0.65 %       Intake/Output Summary (Last 24 hours) at 06/09/16 1213  Last data filed at 06/09/16 1100   Gross per 24 hour   Intake          1497.87 ml   Output             1550 ml   Net           -52.13 ml     Exam:  Gen: NAD, lying in bed  NECK: + JVD  CV: RRR, + TR murmur  RESP: Rales bilat  ABD: firmlydistended, +BS  EXT: 2+ LE edema  NEURO: non-focal, grossly intact    Labs:   CBC  Recent Labs      06/07/16   0600   06/08/16   0555   WBC  4.8  4.8   HGB  10.0*  9.6*   HCT  32.2*  31.4*   PLT  83*  76*   SEG  74  72   LYMPHS  20  17   MONOS  3  6      Chemistry  Recent Labs  06/07/16   0600  06/08/16   0555  06/08/16   1605  06/09/16   0527   NA  132*  129*   --   134*   K  3.3*  2.9*  4.2  3.6   CL  91*  89*   --   91*   BICARB  29  26   --   31*   BUN  82*  88*   --   91*   CREAT  1.59*  1.38*   --   1.78*   GLU  92  106*   --   93   [place]  9.8  9.8   --   10.0   MG  2.5*   --    --    --    PHOS  4.1   --    --    --      Recent Labs      06/07/16   0600  06/09/16   0527   ALK  175*  167*   AST  29  21   ALT  20  17   TBILI  0.42  0.37   DBILI  <0.2   --    ALB  3.3*  3.4*     ASSESSMENT/PLAN:  65 yo F with WHO I PAH associated with CTD admitted following RHC 5/15/18with acute on chronic right heart failure and volume overload.  Also has background of CTD associated ILD.  Chest imaging with possible LIP although has had minimal changes on CT scan over numerous years.  Now with refractory edema/anasarca that is poorly responsive to diuretics with and without dopamine support.  Amount of edema and anasarca seems to be in excess of the hemodynamic impairment observed on RHC 05/27/16.  Now also with acute on chronic kidney injury.  Rising BUN.  No other obvious etiology for refractory edema identified.  Serum protein and albumin unremarkable.  No protein on U/A.  24 hour urine protein pending.    -Empiric trial of solumedrol 60 mg IV daily to treat underlying inflammatory etiology for decompensation (LIP and ? Other organ involvement)  -Cr elevated today.  Dopamine was d/c'd yesterday.  Will follow.  -Continue IV lasix but reduce to TID  -d/c macitentan for unlikely but possible contribution to edema (lat dose 06/08/16)   -Repeat C3,C4 in line with prior values  -Continue prior home digoxin 250 mcg PO QDAY  -ECHO tomorrow  -Continue IV epo 35ng/kg/min.  -Continue sildenafil 80 mg TID  -Continue uloric for gout  -LMWH for DVT  prophylaxis  -Follow thrombocytopenia    [name] M.D.  Pulmonary and Critical Care Attending  Pager [phone]

## 2016-06-09 NOTE — Consults (Signed)
[facility] NOTE  Current Hospital Stay:   14 days - Admitted on: 05/27/2016    Referring Physician: [name]    Reason for Consult: Proteinuria, Refractory Edema    HPI: Diana Chambers is a 66 year old woman with suspected CKD, WHO I PAH (connetive tissue disease) and Sjogren's syndrome who was admitted on 5/15 for right heart failure and volume overload. She was recently admitted from 4/20-5/4 with acute on chronic right failure. She was diuresed with IV lasix ~27 liters. She was discharged on PO Bumex 2 mg BID, however, she continued to gain fluid leading up to her admission.    During this stay, she has had aggressive IV diuresis, most recently receiving Lasix 100 mg IV Q8h. Initially worked quite well then her urine output somewhat decreased. She had trouble w/ urinary retention on 5/25 and a foley catheter was placed (2 attempts). A urinalysis afterwards had significant cellularity and blood and a 24 hour urine had 900 mg protein. This prompted concern about nephritis and renal was consulted. She was also started on Solumedrol 5/28.    She has had no significant hypotensive episodes, has not recently received IV contrast, nor has she been exposed to any obvious nephrotoxins.     She was diagnosed w/ Sjogren's in 1992 and has followed with [facility] since 2004. She was diagnosed with SLE based on lab findings but she has never demonstrated other clinical criteria. She also has a history of gout with high Uric Acid (~11) prior to starting Febuxostat. She has chronic erythematous skin, denies obvious photosensitivity or malar rash. She has never been told she has renal problems.    Allergies   Allergen Reactions    Allopurinol Hives    Levaquin Swelling and Other     Severe joint pain      Ofloxacin Nausea and Vomiting    Chlorhexidine Itching    Sulfa Drugs Unspecified     Meds:  Home Meds  Prescriptions Prior to Admission   Medication Sig Dispense Refill Last Dose     azelastine (ASTELIN) 0.1 % nasal spray Spray 1 spray into each nostril 2 times daily. Use in each nostril as directed 1 bottle 3 05/26/2016 at Unknown time    bumetanide (BUMEX) 2 MG tablet Take 1 tablet (2 mg) by mouth 3 times daily. 90 tablet 0 05/26/2016 at Unknown time    Cholecalciferol 2000 UNITS CAPS Take 1 capsule by mouth daily.   05/26/2016 at Unknown time    colchicine 0.6 MG tablet Take 1 tablet (0.6 mg) by mouth daily. 30 tablet 0 05/27/2016 at Unknown time    Estradiol (DIVIGEL) 0.25 MG/0.25GM GEL Apply 0.25 mg topically nightly.   05/26/2016 at Unknown time    estradiol (ESTRING) 2 MG vaginal ring Insert 1 Ring vaginally Every 3 months. Follow package directions   Unknown    febuxostat (ULORIC) 40 MG TABS Take 1 tablet (40 mg) by mouth daily. 30 tablet 0 05/27/2016 at Unknown time    ferrous sulfate 324 (65 FE) MG TBEC Take 1 tablet (324 mg) by mouth daily. 30 tablet 0 05/27/2016 at Unknown time    FLOLAN 1.5 MG IV SOLR 29ng/kg/min   05/27/2016 at Unknown time    FOLIC ACID 124 MCG OR TABS 1 TABLET DAILY   05/27/2016 at Unknown time    medroxyPROGESTERone (PROVERA) 2.5 MG tablet Take 1.25 mg by mouth daily.   05/26/2016 at Unknown time    potassium chloride (KLOR-CON) 10  MEQ tablet Take 4 tablets (40 mEq) by mouth daily. 120 tablet 0 05/26/2016 at Unknown time    sildenafil (REVATIO) 20 MG tablet Take 4 tablets (80 mg) by mouth 3 times daily. 360 tablet 0 05/27/2016 at Unknown time    spironolactone (ALDACTONE) 50 MG tablet Take 1 tablet (50 mg) by mouth daily. 30 tablet 0 05/26/2016 at Unknown time     Current Meds   digoxin  250 mcg QPM    docusate sodium  250 mg Daily    enoxaparin  40 mg Daily    febuxostat  40 mg Daily    ferrous sulfate  132 mg Daily    folic acid  0.5 mg Daily    furosemide (LASIX) IVPB (doses > 80 mg)  100 mg Q8H    medroxyPROGESTERone  1.25 mg Daily    methylPREDNISolone sodium succinate  60 mg Daily    potassium chloride  40 mEq Daily    Prostacyclin Cassette  Change   Daily    Prostacyclin Ice Pack   Q6H    Prostacyclin Tubing   Once per day on Mon Wed Fri    Prostacylin Batteries   Once per day on Mon    sildenafil  80 mg TID    sodium chloride (PF)  3 mL Q8H    spironolactone  50 mg Daily    vitamin b-12  500 mcg Daily      epoprostenol (FLOLAN) infusion 35 ng/kg/min (06/10/16 0800)    sodium chloride       Past Medical History:   Diagnosis Date    Pulmonary HTN     Sjoegren syndrome (CMS-HCC) 07/12/2009     Social History     Social History    Marital status: Married     Spouse name: N/A    Number of children: N/A    Years of education: N/A     Occupational History    Not on file.     Social History Main Topics    Smoking status: Former Smoker     Packs/day: 0.50     Years: 5.00     Quit date: 01/13/1978    Smokeless tobacco: Never Used    Alcohol use No    Drug use: Not on file    Sexual activity: Not on file     Other Topics Concern    Not on file     Social History Narrative     OBJECTIVE:  Vital signs:  Temperature:  [97.9 F (36.6 C)-98.9 F (37.2 C)] 98.1 F (36.7 C) (05/29 1112)  Blood pressure (BP): (111-140)/(55-79) 128/68 (05/29 1112)  Heart Rate:  [95-108] 96 (05/29 1112)  Respirations:  [16-22] 17 (05/29 1112)  Pain Score: 0 (05/29 1112)  O2 Device: Nasal cannula (05/29 1112)  O2 Flow Rate (L/min):  [3 l/min-4 l/min] 3 l/min (05/29 1112)  SpO2:  [93 %-95 %] 94 % (05/29 1112)    24-Hour Input/Output:  Yesterday's intake and output:  05/28 0600 - 05/29 0559  In: 1362.1 [P.O.:1317; I.V.:45.1]  Out: 1250 [Urine:1250]    Physical Exam:  Gen: Sitting up in a chair, comfortable, on nasal cannula O2, able to speak in full short sentences  HEENT: PEERL EOMI MMM, JVD not appreciated  CV: RRR, prominent sounds in L 2nd interspace, no obvious murmurs; L chest catheter; 3-11m edema b/l LE to knees  Resp: Mild rales anterior and posterior lung fields, no wheezing  Abd: Soft, non-tender, non-distended  GU: Foley in  place  Skin: erythematous but no  obvious rashes    Most Recent Labs:  Recent Labs      06/08/16   0555  06/08/16   1605  06/09/16   0527  06/10/16   0950   NA  129*   --   134*  134*   K  2.9*  4.2  3.6  3.8   CL  89*   --   91*  93*   BICARB  26   --   31*  28   BUN  88*   --   91*  94*   CREAT  1.38*   --   1.78*  1.58*   ALB   --    --   3.4*  3.7   [place]  9.8   --   10.0  10.2     Recent Labs      06/08/16   0555  06/10/16   0604   WBC  4.8  4.0   HCT  31.4*  32.3*   HGB  9.6*  9.7*   PLT  76*  81*   SEG  72  72   MCV  79.3  81.0      08/11/2012 16:00 05/02/2016 22:55 05/04/2016 15:26 05/27/2016 18:10 06/06/2016 12:10 06/09/2016 13:39   Type Clean catch _0    Color Straw Straw Yellow Yellow Yellow Yellow   Appearance _1  Slightly Hazy   Specific Gravity 1.009 1.009 1.010 1.006 1.009 1.011   pH 6.0 6.0 6.0 6.0 5.0 6.0   Protein Negative 1+ (A) 1+ (A) Negative Negative 2+ (A)   Glucose _2  Negative   Ketones _3  Negative   Leuk Esterase _4  3+ (A)   Nitrite _5  Negative   Bilirubin _6  Negative   Blood Negative 1+ (A) 1+ (A) Negative Negative 3+ (A)   Urobilinogen 0.2-1.0 _7    WBC None 0-2 0-2 0-2 0-2 >50 (A)   RBC 0-2 0-2 0-2 None 0-2 >50 (A)   Bacteria Rare Rare Few (A) Rare Rare Rare   Squam. Epithelial Cell None 0-5(RARE) 0-5(RARE) 0-5(RARE)  0-5(RARE)   Mucus   Rare Rare Rare Rare   Hyaline Cast 0-2 >5 (A) 0-2  >5 (A) >5 (A)      06/09/2016 10:15 06/09/2016 10:15   Duration 24 24   Volume 1520 1520   Creatinine Total 24hr 745    Creatinine Concentration 49    Protein Total 24hr 897    Protein Concentration 59      Rheum    C3  106 (nl)  C4  17 (nl)    ANA  >1:640 (2012)  dsDNA  >200 (2018)    Myositis Panel, Positive  for:  SSA-52 219 AU/mL  SSA60  120 AU/mL  [facility]  Positive     05/04/2016 14:45   Cardiolipin Ab, IgA 0   Cardiolipin IgG, Ab <10   Cardiolipin IgM, Ab 0   Jo-1 Ab, IgG 0   RNP 2.4   SCL-70 Ab 2   Smith Ab 3.6   SSA Ab >240.0 (H)   SSB Ab 58.0 (H)     SPEP  Pending  UPEP  Pending    Micro  HIV  Non-reactive (2005)  HCV  Non-reactive Ab (04/2016)  Imaging:  CT Abd/Pel, 5/26  Sequelae of volume overload, likely secondary to right heart dysfunction.  No intra-abdominal mass is identified.  Cholelithiasis without evidence of acute cholecystitis.  Findings of pulmonary arterial hypertension are better evaluated on CT thorax from 05/04/2016.  Note that assessment of solid organs is limited in the absence of intravenous contrast.    TTE 06/11/16  1. The left ventricular size is normal and the left ventricular systolic function is normal.  2. Mild or grade I (impaired relaxation pattern) left ventricular diastolic filling.  3. The right ventricular size is severely enlarged and systolic function is reduced.  4. Moderate tricuspid regurgitation.  5. The aortic valve is mildly stenotic.  6. Severe pulmonary hypertension with right ventricular systolic pressure measuring 75 mmHg.  7. Compared to prior study no significant change.    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    # Volume Overload: Her volume overload is likely due to right heart failure, on Echo 5/29, resp variation of IVC <50% suggestive of volume overload. She does have significant LE edema as well, rales are minimal.  - Continue diuresis with Lasix 100 mg IV q8h, add Metolazone 5 mg once daily to this as well, monitor K+ closely  - Daily I/O goal 1-2L net negative  - Daily weights    # AKI on CKD 3b: She has CKD according to our prior records w/ Cr ~1.1 for the past  few months. Of note, her eGFR may be falsely higher given her low body mass. Cause is most likely congestion from heart failure. Her recent fluctuation in Cr suggest this is hemodynamic-related. Regarding her recent urinalysis which was quite active, this occurred after foley placement, prior to that there was no blood and no cells. That being said, she has Sjogren's and possibly Lupus, though the latter has never manifested itself clinically. Sjogrens can cause an interstitial nephritis. This is unlikely Lupus nephritis given her lack of consistently active urine sediment.  - We agree w SPEP  - Also send Kappa/Lambda and Immunofixation (mildly high [place], r/o paraproteinemia)  - Send for HBV Ab and IgG4  - Repeat Urine Lytes (If Na low, highly suggestive of pre-renal, if high, cannot interpret 2/2 diuretics)    We will continue to follow    Plan discussed with the attending, Dr. Laurin Coder.    Jeanie Sewer, MD  Internal Medicine PGY3  Nephrology Team  Pager: [phone]

## 2016-06-10 DIAGNOSIS — N179 Acute kidney failure, unspecified: Secondary | ICD-10-CM

## 2016-06-10 DIAGNOSIS — R601 Generalized edema: Secondary | ICD-10-CM

## 2016-06-10 DIAGNOSIS — N183 Chronic kidney disease, stage 3 (moderate): Secondary | ICD-10-CM

## 2016-06-10 DIAGNOSIS — I270 Primary pulmonary hypertension: Secondary | ICD-10-CM

## 2016-06-10 LAB — URINALYSIS
Bilirubin: NEGATIVE
Glucose: NEGATIVE
Ketones: NEGATIVE
Nitrite: NEGATIVE
Specific Gravity: 1.011 (ref 1.002–1.030)
Urobilinogen: NEGATIVE
pH: 6 (ref 5.0–8.0)

## 2016-06-10 LAB — MDIFF
Immature Granulocytes Absolute Manual: 0.2 10*3/uL — ABNORMAL HIGH (ref 0.0–0.1)
Metamyelocytes: 1 %
Myelocytes: 3 %
Number of Cells Counted: 115
Plt Est: DECREASED
RBC Comment: NORMAL

## 2016-06-10 LAB — COMPREHENSIVE METABOLIC PANEL, BLOOD
ALT (SGPT): 15 U/L (ref 0–33)
AST (SGOT): 21 U/L (ref 0–32)
Albumin: 3.7 g/dL (ref 3.5–5.2)
Alkaline Phos: 155 U/L — ABNORMAL HIGH (ref 35–140)
Anion Gap: 13 mmol/L (ref 7–15)
BUN: 94 mg/dL — ABNORMAL HIGH (ref 8–23)
Bicarbonate: 28 mmol/L (ref 22–29)
Bilirubin, Tot: 0.35 mg/dL (ref <1.2)
Calcium: 10.2 mg/dL (ref 8.5–10.6)
Chloride: 93 mmol/L — ABNORMAL LOW (ref 98–107)
Creatinine: 1.58 mg/dL — ABNORMAL HIGH (ref 0.51–0.95)
GFR: 33 mL/min
Glucose: 142 mg/dL — ABNORMAL HIGH (ref 70–99)
Potassium: 3.8 mmol/L (ref 3.5–5.1)
Sodium: 134 mmol/L — ABNORMAL LOW (ref 136–145)
Total Protein: 7.4 g/dL (ref 6.0–8.0)

## 2016-06-10 LAB — CBC WITH DIFF, BLOOD
ANC-Manual Mode: 2.9 10*3/uL (ref 1.6–7.0)
Abs Lymphs: 0.7 10*3/uL — ABNORMAL LOW (ref 0.8–3.1)
Abs Monos: 0.3 10*3/uL (ref 0.2–0.8)
Hct: 32.3 % — ABNORMAL LOW (ref 34.0–45.0)
Hgb: 9.7 gm/dL — ABNORMAL LOW (ref 11.2–15.7)
Lymphocytes: 17 %
MCH: 24.3 pg — ABNORMAL LOW (ref 26.0–32.0)
MCHC: 30 g/dL — ABNORMAL LOW (ref 32.0–36.0)
MCV: 81 um3 (ref 79.0–95.0)
MPV: 10.1 fL (ref 9.4–12.4)
Monocytes: 7 %
NRBC: 1 /100 WBC (ref <1)
Plt Count: 81 10*3/uL — ABNORMAL LOW (ref 140–370)
RBC: 3.99 10*6/uL (ref 3.90–5.20)
RDW: 18 % — ABNORMAL HIGH (ref 12.0–14.0)
Segs: 72 %
WBC: 4 10*3/uL (ref 4.0–10.0)

## 2016-06-10 LAB — 2D ECHO WITH IMAGE ENHANCEMENT AGENT IF NECESSARY
AO Mean Gradient: 11.2 mmHg
Aortic Valve Area: 1.86 cm2
IVC Diameter: 2.27 cm
LA Volume Index: 20.8 ml/m²
LV Ejection Fraction: 88 %
PA Pressure: 75 mmHg

## 2016-06-10 LAB — URINE ELECTROLYTES
Chloride, Urine: <20 mmol/L
Potassium, Urine: 38 mmol/L
Sodium, Urine: <20 mmol/L

## 2016-06-10 LAB — ANTI-DSDNA, BLOOD: Anti-DSDNA: >200 IU — ABNORMAL HIGH (ref 0–24)

## 2016-06-10 NOTE — Consults (Addendum)
ATTENDING PROGRESS NOTE ATTESTATION     Subjective   I have reviewed the history. 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  She was recently admitted for volume overload 4/20-05/16/16, and was admitted 05/27/16 for RHC. She has been more refractory to diuretics during this admission.  Thinks bumetanide doesn't work as well as furosemide for her.  Cr has mostly fluctuated between 0.9 to 1.2 since 2003, since this admit has been higher, reaching a peak Cr of 1.78 yesterday.  2 prior UAs were negative, then a foley was placed, UA and 24h urine sent, which showed some protein and cells.  Concern if kidney issue is contributing to edema. She has been on 2L O2, and found increasing O2 req to 3-4L prior to this admit.      PMHx/meds/all/shx/fhx/ros reviewed and as per forthcoming resident note    Objective   I have examined the patient and concur with the resident/fellow exam.   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  Risk factors for chronic kidney disease include right heart failure, high uric acid levels, and possibly Sjogren's.  There were two normal appearing UAs this admit, prior to foley placement, so the cells and protein on the 5/28 sample are likely related to mild trauma from foley vs urinary infection. UA detects albumin, and 24h total protein detects all proteins, which could be tubular proteins or from cells in urine, however agree with spep/IF and kappa/lambda, consider also sending IgG4. Even though dsDNA and ANA high, c3/c4 are normal, making active lupus  much less likely.  She appears to be total body fluid overloaded, yet rising BUN, calcium, and bicarbonate might suggest she is intravascularly "dry", will check UNa to confirm (if low, if elevated, will only tell you that the diuretics are working).  Can place on standing metolazone with furosemide to assist diuresis.  Perhaps it is her worsened renal failure this admission that has made her less responsive to diuretics, compared to her last admission.   I agree with the resident/fellow care plan.     See the resident/fellow note for further details.

## 2016-06-10 NOTE — Progress Notes (Signed)
Pulmonary Hypertension Service Progress Note    Date of Admission: 05/27/2016    Date of Service: 06/10/2016    Chief Complaint: "I still have swelling"    Subjective/HPI:   Still reporting leg swelling and dyspnea. Denies night sweats, fevers. Denies chest pain.    Interval Events over last 48 hours:  - stopped opsumit  - stopped dopamine   - started steroids   - nephrology consult    Current Facility-Administered Medications   Medication    acetaminophen (TYLENOL) tablet 650 mg    digoxin (LANOXIN) tablet 250 mcg    docusate sodium (COLACE) capsule 250 mg    enoxaparin (LOVENOX) injection 40 mg    epoprostenol (FLOLAN) 60,000 ng/mL in glycine diluent 100 mL infusion    febuxostat (ULORIC) tablet 40 mg    ferrous sulfate EC tablet 160 mg    folic acid (FOLVITE) tablet 0.5 mg    furosemide (LASIX) 100 mg in sodium chloride 0.9 % 50 mL IVPB    medroxyPROGESTERone (PROVERA) tablet 1.25 mg    methylPREDNISolone sodium succinate (SOLU-MEDROL) injection 60 mg    nalOXone (NARCAN) injection 0.1 mg    ondansetron (ZOFRAN) injection 4 mg    potassium chloride (KLOR-CON) ER tablet 40 mEq    Prostacyclin Cassette Change    Prostacyclin Ice Pack    Prostacyclin Tubing    Prostacylin Batteries    sildenafil (REVATIO, VIAGRA) tablet 80 mg    sodium chloride (PF) 0.9 % flush 3 mL    sodium chloride (PF) 0.9 % flush 3 mL    sodium chloride 0.9 % TKO infusion    spironolactone (ALDACTONE) tablet 50 mg    vitamin b-12 (CYANOCOBALAMIN) tablet 500 mcg     Review of Systems:  - Constitutional: denies fevers, chills and night sweats   - ENT: denies rhinorrhea, sore throat  - Cardiac: denies chest pain, palpitations; +edema, ongoing   - Respiratory: +dyspnea and dry coughing (stable); denies hemoptysis  - GI: denies nausea, vomiting, abdominal pain or diarrhea; + abd distention   - GU: denies dysuria, hematuria   - Skin: denies new ecchymoses   - Neuro: denies headaches, parasthesias or paresis   - MSK:  denies myalgias or arthralgias     Objective  Temperature:  [97.9 F (36.6 C)-98.9 F (37.2 C)] 98.1 F (36.7 C) (05/29 1112)  Blood pressure (BP): (111-140)/(55-79) 128/68 (05/29 1112)  Heart Rate:  [95-108] 96 (05/29 1112)  Respirations:  [16-22] 17 (05/29 1112)  Pain Score: 0 (05/29 1112)  O2 Device: Nasal cannula (05/29 1112)  O2 Flow Rate (L/min):  [3 l/min-4 l/min] 3 l/min (05/29 1112)  SpO2:  [93 %-95 %] 94 % (05/29 1112)  I/O: 1362/1250= +112cc/hour  UOP of 1250cc/24 hours  Wt: 73.8kg (76.9kg, 5/28)    Physical Exam  General: sitting in the chair comfortably. Chronically ill appearing. Accompanied by her husband.   HEENT: NC/AT. PERRL, EOMI. Scler anicteric. Moist oral mucosa.   Neck: + JVD  CV: RRR, loud S2  Lungs: Good effort. Bibasilar crackles.   Abdomen: Soft, mild distention, NT, + BS. No guarding or rebound  Extremities:3-4+ LEedema. Warm distal extremities.   Skin: maculopapular rash over LE's, stable   Neuro: CN II-XII grossly intact, strength grossly intact  Psych: flat affect     Labs  Recent Labs      06/08/16   0555  06/08/16   1605  06/09/16   0527  06/10/16   0950   NA  129*   --  134*  134*   K  2.9*  4.2  3.6  3.8   CL  89*   --   91*  93*   BICARB  26   --   31*  28   BUN  88*   --   91*  94*   CREAT  1.38*   --   1.78*  1.58*   [place]  9.8   --   10.0  10.2   TP   --    --   7.2  7.4   ALB   --    --   3.4*  3.7   TBILI   --    --   0.37  0.35   AST   --    --   21  21   ALT   --    --   17  15   ALK   --    --   167*  155*       Recent Labs      06/08/16   0555  06/10/16   0604   WBC  4.8  4.0   HGB  9.6*  9.7*   HCT  31.4*  32.3*   MCV  79.3  81.0   PLT  76*  81*   SEG  72  72   LYMPHS  17  17   MONOS  6  7       No results for input(s): PTT, INR in the last 72 hours.    No results for input(s): CPK, [facility], TROPONIN in the last 72 hours.    No results for input(s): LACTATE in the last 72 hours.    Pertinent Micro  - none this admission     Antibiotics  - none     Pertinent  Imaging  - none new this am     - CT A/P non-contrast (5/26):  CT scan of the abdomen and pelvis without IV contrast  Sequelae of volume overload, likely secondary to right heart dysfunction.  No intra-abdominal mass is identified.  Cholelithiasis without evidence of acute cholecystitis.  Findings of pulmonary arterial hypertension are better evaluated on CT thorax from 05/04/2016.  Note that assessment of solid organs is limited in the absence of intravenous contrast.     - Abd U/S (05/29/2016):  Small amount of perihepatic ascites.  Cholelithiasis without evidence of acute cholecystitis.  Hepatosplenomegaly    - CT Chest w/o IVC (05/04/2016):  - Radiology Impression:  1. Severe enlargement of the pulmonary artery with increased calcification along the pulmonary arterial walls compatible with longstanding pulmonary hypertension. Progressed parenchymal changes of the lungs compatible with pulmonary hypertension.  2. Increased thin wall l cystic esions seen within the lungs, nonspecific be can be seen with underlying lymphocytic interstitial pneumonia, which can be seen in patients with lupus/Sjogren's disease.  3. Cardiomegaly with evidence of right heart dysfunction. Trace nonspecific pericardial fluid with pericardial thickening suspected. In a patient with known lupus underlying pericarditis/serositis may be present. Correlation with echocardiogram is recommended.  4. Likely reactive adenopathy of the thorax.  5. Additional findings as detailed above.  - Independently Reviewed:      Misc:  - Spirometry (05/10/2016):  - FEV1/FVC 63%  - FEV1 0.79 L (38%)  - FVC 1.24 L (47%)  - TLC 2.73 L (58%)  - RV 1.49 L (75%); RV/TLC 130% predicted  - DLCO 19.90 (60%)  - 22mt 410 meters (12/12/2015)      -  TTE (05/12/2016):  1. The left ventricular size is normal and the left ventricular systolic function is normal.  2. The right ventricular size is moderately enlarged and systolic function is reduced.  3. Moderate  tricuspid regurgitation.  4. Severe pulmonary hypertension with right ventricular systolic pressure measuring 70 mmHg.  5. Compared to prior study , PA pressures are slightly lower.    - RHC (05/27/2016):    - prior RHC done 02/2016 with PAP 97/35/58, PVR 584 D (7.30 WU), fCO/CI 6.85/3.9, tdCO/CI 6.17/3.51    ASSESSMENT AND PLAN  Diana Chambers is a 45 year [place] old female with SLE/Sjogren's and WHO Group I PAH who was admitted 5/15 after RHC was concerning for worsening hemodynamics. She has been on triple therapy including flolan although opsumit stopped. Her problem list is as follows:     # WHO Group I PAH with RV Overload: underlying PAH in the setting of prior anorexigen use and CTD (Sjogrens). RHC 5/15 with PAP 92/37/62, PAOP 12, tdPVR 9.28 WU and normal CO/CI. Currently on flolan 35ng/kg/min. Remains volume overloaded  - continue diuresis with lasix 80 IV q8h and oral spironolactone  - consider diuril or metolazone   - continue  PAH PRA with flolan (currently at 35ng/kg/min) and PDE-I with sildenafil 80 TID; opsumit stopped  - follow up repeat TTE from today    -- note, since admission the flolan dose has been increased and opsumit is a new medication for her     # Acute on chronic hypoxemic respiratory failure: secondary to underlying PAH with likely destruction of cap-alveolar membrane and subsequent diffusion impairment (Fick's Law). 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 Stable on 3 LPM  - continue supplemental O2 as needed  - continue treatment of PAH, as above   - continue diuresis  - continue steroids, started 5/28  - would benefit from sleep study     # ARF on CKD: baseline likely 1.1 - 1.2, currently 1.58. Likely cardiorenal with acute worsening due to poor response to diuresis. Proteinuria likely 2/2 foley  placement   - continue treatment of PAH as above  - continue diuresis  - monitor sCr and UOP  - avoid nephrotoxins and renally dose medications   - follow up renal recommendations     # Hypokalemia: 2/2 diuresis and poor po intake. 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 Less likely [facility], LAM, BDH  - solumedrol started 5/28, continue for now   - outpatient follow up with rheumatology and pulmonary  - will need repeat imaging down the line     # Anemia/Thrombocytopenia:ongoing, although stable lines this am. Appears to be a chronic proces, ? Bone marrow suppression. No sign of infectious process, TSH 3.49, Fe 26, Transferritin 191, ferritin 47; RBC folate >1240B12 265(5/22)  - repeat CBC in the am   - continue oral Fe and B12    # Elevated HK:[id number] congestive hepatopathy. LFT's stable this am   - continue treatment of R heart failure   - monitor LFT's    # Gout: last uric acid 8.6 (5/21), improved from 11.8 (4/24)  - continue uloric for now     # FEN:  - 2 gram Na+   - continue KCl 40 mEq  daily     # PPx:  - DVT: LMWH 40 daily; monitor renal function  - Stress Ulcer: none   - Bowels: docusate   - Prn: APAP, zofran    # Code:  - FC/FT     # Dispo:  - ongoing level of care    # Misc:  - labs/imaging: ordered   - consults: none    - invasion: central access via L sided tunneled SC line   - point of contact: [name] (husband) - [phone]; at bedside and updated on the plan of care     This patient was seen and discussed with Dr. [name].    Alfonse Ras, M.D.  Fellow  Department of Pulmonary and Critical Care Medicine  [phone]        Pulmonary Vascular Attending Addendum:  Patient was examined with Dr. Candis Schatz, imaging and tests reviewed.  Please see above note for full details and plan.    66 yo F with WHO I PAH associated with CTD admitted following RHC 5/15/18with acute on  chronic right heart failure and volume overload.  Also has background of CTD associated ILD.  Chest imaging with possible LIP although has had minimal changes on CT scan over numerous years.  Now with refractory edema/anasarca that is poorly responsive to diuretics with and without dopamine support.  Amount of edema and anasarca seems to be in excess of the hemodynamic impairment observed on RHC 05/27/16.  Now also with acute on chronic kidney injury.  Rising BUN.  No other obvious etiology for refractory edema identified.  Serum protein and albumin unremarkable.  No protein on U/A x2 but close to 1 gm on 24 hour urine protein.  SPEP UPEP pending.    -Continue day 2 trial of solumedrol 60 mg IV daily to treat underlying inflammatory etiology for decompensation (LIP and ? Other organ involvement)  -Cr stable.  -Renal consult.  -Continue IV lasix TID  -d/c macitentan for unlikely but possible contribution to edema (lat dose 06/08/16)   -Continue prior home digoxin 250 mcg PO QDAY  -ECHO with stable RVSP.  Improved TAPSE.    -Continue IV epo 35ng/kg/min.  -Continue sildenafil 80 mg TID  -Continue uloric for gout  -LMWH for DVT prophylaxis  -Follow thrombocytopenia    [name] M.D.  Pulmonary and Critical Care Attending  Pager [phone]

## 2016-06-10 NOTE — Plan of Care (Signed)
Problem: Fluid Volume Excess  Goal: Absence of edema; peripheral and/or pulmonary  Outcome: Progressing toward goal, anticipate improvement over: next 12-24 hours  Edema noted to BLE +3 and up to abdomen. Continuing intermittent lasix gtt, aldactone, strict I&O's and daily weights. Weight loss this AM, 1500 ml fluid restriction- keeping track on patient's white board in the room, compliant. Foley in for accurate output monitoring. Rales noted in lower lobes, pt dyspneic on exertion. Continuing to mtr closely for changes.       Problem: Breathing Pattern - Ineffective  Goal: Respiratory rate, rhythm and depth return to baseline  Outcome: Goal Met  Respirations labored on exertion- lung fields with crackles/ rales in lower lobes. Tachypnea at times. IV solumedrol  24m daily. Continuing flolan at 753m24 hrs. Casette changed, line free from kinks- site CD&I without redness- safety checks performed. On 3L NC, SpO2 stable, using I.S. Appropriately. Will continue to mtr for respiratory changes.       Problem: Tissue Perfusion, Cardiopulmonary - Altered  Goal: Hemodynamic stability  Outcome: Goal Met  SR with 1st degree AVB on telemetry. Pt denies CP/palpitations/distress. HR 90-low 100's. Map 80's. Echo performed today- refer to results review for details. Will continue to mtr for changes.   Goal: Early recognition of deterioration  Outcome: Goal Met  No SxS deterioration noted.

## 2016-06-10 NOTE — Interdisciplinary (Signed)
06/10/16 1514   Follow Up/Progress   Is the Patient Ready for Discharge No   Barriers to Discharge Awaiting clinical improvement   Plan/Interventions Explore needs and options for aftercare, provide referrals     Per nursing rounds, pt unstable for d/c.  She remains on Flolan gtt.  No change with treatment plan of care.    Barriers to d/c: clinical improvement.  Transportation: family.  DCP: TBD.  Pt and care team aware and agree to further discuss d/c plan pending care team recommendations and clinical improvement.  CM to continue to follow for d/c needs.    Ria Clock MSN/RN/CNL/PHN  Inpatient care manager  [phone]

## 2016-06-11 ENCOUNTER — Ambulatory Visit (HOSPITAL_COMMUNITY): Payer: Medicare Other | Admitting: Pulmonary Medicine

## 2016-06-11 ENCOUNTER — Ambulatory Visit (INDEPENDENT_AMBULATORY_CARE_PROVIDER_SITE_OTHER): Payer: Medicare Other | Admitting: Rheumatology

## 2016-06-11 DIAGNOSIS — I071 Rheumatic tricuspid insufficiency: Secondary | ICD-10-CM

## 2016-06-11 DIAGNOSIS — R931 Abnormal findings on diagnostic imaging of heart and coronary circulation: Secondary | ICD-10-CM

## 2016-06-11 DIAGNOSIS — I272 Pulmonary hypertension, unspecified: Secondary | ICD-10-CM

## 2016-06-11 DIAGNOSIS — I501 Left ventricular failure: Secondary | ICD-10-CM

## 2016-06-11 DIAGNOSIS — I281 Aneurysm of pulmonary artery: Secondary | ICD-10-CM

## 2016-06-11 LAB — CBC WITH DIFF, BLOOD
ANC-Manual Mode: 4.6 10*3/uL (ref 1.6–7.0)
Abs Lymphs: 0.3 10*3/uL — ABNORMAL LOW (ref 0.8–3.1)
Abs Monos: 0.1 10*3/uL — ABNORMAL LOW (ref 0.2–0.8)
Hct: 32.4 % — ABNORMAL LOW (ref 34.0–45.0)
Hgb: 10.1 gm/dL — ABNORMAL LOW (ref 11.2–15.7)
Lymphocytes: 5 %
MCH: 25.3 pg — ABNORMAL LOW (ref 26.0–32.0)
MCHC: 31.2 g/dL — ABNORMAL LOW (ref 32.0–36.0)
MCV: 81.2 um3 (ref 79.0–95.0)
Monocytes: 2 %
Plt Count: 94 10*3/uL — ABNORMAL LOW (ref 140–370)
RBC: 3.99 10*6/uL (ref 3.90–5.20)
RDW: 18 % — ABNORMAL HIGH (ref 12.0–14.0)
Segs: 92 %
WBC: 5 10*3/uL (ref 4.0–10.0)

## 2016-06-11 LAB — 24 HOUR URINE PROTEIN ELECTROPHORESIS
Albumin, Urine: 27.3 mg/dL
Alpha 1, Urine: 0.6 mg/dL
Alpha 2, Urine: 7.5 mg/dL
Beta, Urine: 4.2 mg/dL
Duration: 24 hours (ref 0–48)
Gamma, Urine: 18.5 mg/dL
Total Protein, UPEP: 58 mg/dL
Volume: 1450 mL (ref 600–1600)

## 2016-06-11 LAB — MDIFF
Immature Granulocytes Absolute Manual: 0.1 10*3/uL (ref 0.0–0.1)
Myelocytes: 1 %
Number of Cells Counted: 112
Plt Est: DECREASED

## 2016-06-11 LAB — INTERPRETATION, URINE EPG

## 2016-06-11 LAB — KAPPA LAMBDA FREE LIGHT CHAIN W/ RATIO, BLOOD
Kappa Free Light Chains Quant: 91.3 mg/L — ABNORMAL HIGH (ref 3.3–19.4)
Kappa:Lambda Free Light Chain Ratio: 2 — ABNORMAL HIGH (ref 0.26–1.65)
Lambda Free Light Chains Quant: 45.7 mg/L — ABNORMAL HIGH (ref 5.7–26.3)

## 2016-06-11 LAB — BASIC METABOLIC PANEL, BLOOD
Anion Gap: 14 mmol/L (ref 7–15)
BUN: 99 mg/dL — ABNORMAL HIGH (ref 8–23)
Bicarbonate: 28 mmol/L (ref 22–29)
Calcium: 10.7 mg/dL — ABNORMAL HIGH (ref 8.5–10.6)
Chloride: 96 mmol/L — ABNORMAL LOW (ref 98–107)
Creatinine: 1.45 mg/dL — ABNORMAL HIGH (ref 0.51–0.95)
GFR: 36 mL/min
Glucose: 81 mg/dL (ref 70–99)
Potassium: 4 mmol/L (ref 3.5–5.1)
Sodium: 138 mmol/L (ref 136–145)

## 2016-06-11 LAB — HEPATITIS B SURFACE AG, BLOOD: HBsAg: NONREACTIVE

## 2016-06-11 LAB — HEPATITIS B SURFACE AB, QUANT, BLOOD: HBsAb,Qt: <3.5 m[IU]/mL

## 2016-06-11 LAB — MAGNESIUM, BLOOD: Magnesium: 2.6 mg/dL — ABNORMAL HIGH (ref 1.6–2.4)

## 2016-06-11 LAB — EPG, SERUM
A/G Ratio, EPG: 0.84
Albumin, EPG: 3.28 gm/dL (ref 3.20–5.00)
Alpha 1, EPG: 0.3 gm/dL (ref 0.10–0.40)
Alpha 2, EPG: 0.68 gm/dL (ref 0.60–1.10)
Beta, EPG: 0.9 gm/dL (ref 0.60–1.30)
Gamma, EPG: 2.05 gm/dL — ABNORMAL HIGH (ref 0.70–1.50)
Total Protein, EPG: 7.2 gm/dL (ref 6.00–8.00)

## 2016-06-11 LAB — URINE IMMUNOFIXATION

## 2016-06-11 LAB — IMMUNOFIX INTERPRETATION, URINE

## 2016-06-11 LAB — EPG, INTERPRETATION, SERUM

## 2016-06-11 LAB — PHOSPHORUS, BLOOD: Phosphorous: 4 mg/dL (ref 2.7–4.5)

## 2016-06-11 MED ORDER — METOLAZONE 5 MG OR TABS
5.00 mg | ORAL_TABLET | Freq: Once | ORAL | Status: AC
Start: 2016-06-11 — End: 2016-06-11
  Administered 2016-06-11: 5 mg via ORAL
  Filled 2016-06-11: qty 1

## 2016-06-11 NOTE — Interdisciplinary (Signed)
Patient refused to do sleep study. She notified me that she felt like she couldn't breath with the oxygen mask on the last time she attempted to do the sleep study.

## 2016-06-11 NOTE — Plan of Care (Signed)
Problem: Fluid Volume Excess  Goal: Absence of edema; peripheral and/or pulmonary  Outcome: Unable to meet goal at this time.  Pt with bilateral LE edema noted. Strict I&O in place. Daily weights. IV and PO diuretics administered as ordered, please see eMAR. 1.5L/day FR. Goal ongoing.     Problem: Breathing Pattern - Ineffective  Goal: Respiratory rate, rhythm and depth return to baseline  Outcome: Goal Met  Pt remains on 3L/NC with O2 sat 93-95%. SOB noted with exertion. Pt on Flolan infusion at 23m/24hrs, no changes made to rate today.     Problem: Tissue Perfusion, Cardiopulmonary - Altered  Goal: Hemodynamic stability  Outcome: Goal Met  Pt SR/ST on continuous telemetry monitoring. VSS. Vitals monitored q4hrs and PRN.     Problem: Falls, Risk of  Goal: Keep patient free from falls utilizing universal fall precautions  Outcome: Goal Met  Pt up to ambulate in halls with stand by assist.

## 2016-06-11 NOTE — Progress Notes (Signed)
Pulmonary Hypertension Service Progress Note    Date of Admission: 05/27/2016    Date of Service: 06/11/2016    Chief Complaint: "I feel about the same"    Subjective/HPI:   Has not had much change overnight. Still reports leg swelling and dyspnea with exertion. She denies chest pain, fevers or night sweats. Denies abdominal pain or diarrhea.     Current Facility-Administered Medications   Medication    acetaminophen (TYLENOL) tablet 650 mg    digoxin (LANOXIN) tablet 250 mcg    docusate sodium (COLACE) capsule 250 mg    enoxaparin (LOVENOX) injection 40 mg    epoprostenol (FLOLAN) 60,000 ng/mL in glycine diluent 100 mL infusion    febuxostat (ULORIC) tablet 40 mg    ferrous sulfate EC tablet 834 mg    folic acid (FOLVITE) tablet 0.5 mg    furosemide (LASIX) 100 mg in sodium chloride 0.9 % 50 mL IVPB    medroxyPROGESTERone (PROVERA) tablet 1.25 mg    methylPREDNISolone sodium succinate (SOLU-MEDROL) injection 60 mg    nalOXone (NARCAN) injection 0.1 mg    ondansetron (ZOFRAN) injection 4 mg    potassium chloride (KLOR-CON) ER tablet 40 mEq    Prostacyclin Cassette Change    Prostacyclin Ice Pack    Prostacyclin Tubing    Prostacylin Batteries    sildenafil (REVATIO, VIAGRA) tablet 80 mg    sodium chloride (PF) 0.9 % flush 3 mL    sodium chloride (PF) 0.9 % flush 3 mL    sodium chloride 0.9 % TKO infusion    spironolactone (ALDACTONE) tablet 50 mg    vitamin b-12 (CYANOCOBALAMIN) tablet 500 mcg     Review of Systems:  - Constitutional: denies fevers, chills and night sweats   - Cardiac: denies chest pain, palpitations; +edema  - Respiratory: +dyspnea with exertion; denies coughing or hemoptysis  - GI: denies nausea, vomiting, abdominal pain  - Skin: denies rashes or ecchymoses   - Neuro: denies headaches, parasthesias or paresis   - MSK: denies myalgias or arthralgias     Objective  Temperature:  [98.1 F (36.7 C)-98.6 F (37 C)] 98.3 F (36.8 C) (05/30 0745)  Blood pressure (BP):  (112-133)/(53-69) 133/60 (05/30 0745)  Heart Rate:  [90-100] 94 (05/30 0745)  Respirations:  [16-22] 18 (05/30 0745)  Pain Score: 0 (05/30 0600)  O2 Device: Nasal cannula (05/30 0600)  O2 Flow Rate (L/min):  [3 l/min] 3 l/min (05/30 0600)  SpO2:  [94 %-96 %] 95 % (05/30 0745)  I/O: 1144/950 = +194cc/hour  UOP of 950cc/24 hours  Wt: 77.3kg (73.8kg, 5/29)    Physical Exam  General: sitting in the chair comfortably. Chronically ill appearing.   HEENT: NC/AT. PERRL, EOMI. Sclera anicteric. Moist oral mucosa.   Neck: + JVD  CV: RRR, loud S2  Lungs: Good effort. Bibasilar crackles.   Abdomen: Soft, mild distention, NT, + BS. No guarding or rebound  Extremities:3-4+ LEedema. TED Hose on. Warm distal extremities.   Skin: maculopapular rash over LE's, stable   Neuro: CN II-XII grossly intact, strength grossly intact  Psych: flat affect     Labs  Recent Labs      06/09/16   0527  06/10/16   0950  06/11/16   0549   NA  134*  134*  138   K  3.6  3.8  4.0   CL  91*  93*  96*   BICARB  31*  28  28   BUN  91*  94*  99*   CREAT  1.78*  1.58*  1.45*   [place]  10.0  10.2  10.7*   MG   --    --   2.6*   PHOS   --    --   4.0   TP  7.2  7.4   --    ALB  3.4*  3.7   --    TBILI  0.37  0.35   --    AST  21  21   --    ALT  17  15   --    ALK  167*  155*   --        Recent Labs      06/10/16   0604  06/11/16   0549   WBC  4.0  5.0   HGB  9.7*  10.1*   HCT  32.3*  32.4*   MCV  81.0  81.2   PLT  81*  94*   SEG  72  92   LYMPHS  17  5   MONOS  7  2       No results for input(s): PTT, INR in the last 72 hours.    No results for input(s): CPK, [facility], TROPONIN in the last 72 hours.    No results for input(s): LACTATE in the last 72 hours.    Pertinent Micro  - Ucx pending from 5/29 and 5/30    Antibiotics  - none     Pertinent Imaging  - none new this am     - CT A/P non-contrast (5/26):  CT scan of the abdomen and pelvis without IV contrast  Sequelae of volume overload, likely secondary to right heart dysfunction.  No intra-abdominal  mass is identified.  Cholelithiasis without evidence of acute cholecystitis.  Findings of pulmonary arterial hypertension are better evaluated on CT thorax from 05/04/2016.  Note that assessment of solid organs is limited in the absence of intravenous contrast.     - Abd U/S (05/29/2016):  Small amount of perihepatic ascites.  Cholelithiasis without evidence of acute cholecystitis.  Hepatosplenomegaly    - CT Chest w/o IVC (05/04/2016):  - Radiology Impression:  1. Severe enlargement of the pulmonary artery with increased calcification along the pulmonary arterial walls compatible with longstanding pulmonary hypertension. Progressed parenchymal changes of the lungs compatible with pulmonary hypertension.  2. Increased thin wall l cystic esions seen within the lungs, nonspecific be can be seen with underlying lymphocytic interstitial pneumonia, which can be seen in patients with lupus/Sjogren's disease.  3. Cardiomegaly with evidence of right heart dysfunction. Trace nonspecific pericardial fluid with pericardial thickening suspected. In a patient with known lupus underlying pericarditis/serositis may be present. Correlation with echocardiogram is recommended.  4. Likely reactive adenopathy of the thorax.  5. Additional findings as detailed above.  - Independently Reviewed:      Misc:  - TTE (06/10/2016):  1. The left ventricular size is normal and the left ventricular systolic function is normal.  2. Mild or grade I (impaired relaxation pattern) left ventricular diastolic filling.  3. The right ventricular size is severely enlarged and systolic function is reduced.  4. Moderate tricuspid regurgitation.  5. The aortic valve is mildly stenotic.  6. Severe pulmonary hypertension with right ventricular systolic pressure measuring 75 mmHg.  7. Compared to prior study no significant change.    - TTE (05/12/2016):  1. The left ventricular size is normal and the left ventricular systolic function is normal.  2.  The right ventricular size is moderately enlarged and systolic function is reduced.  3. Moderate tricuspid regurgitation.  4. Severe pulmonary hypertension with right ventricular systolic pressure measuring 70 mmHg.  5. Compared to prior study , PA pressures are slightly lower.    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 She has been on triple therapy including flolan although opsumit stopped. Remains refractory to diuresis. Her problem list is as follows:     # WHO Group I PAH with RV Overload: underlying PAH in the setting of prior anorexigen use and CTD (Sjogrens). RHC 5/15 with PAP 92/37/62, PAOP 12, tdPVR 9.28 WU and normal CO/CI. Currently on flolan 35ng/kg/min. Remains volume overloaded, refractory to diuresis (Una < 20 at 1725 on 5/29; about 2.5 hours after lasix dose was given). Repeat TTE 5/29 unchanged   - continue diuresis with lasix 100 IV q8h and oral spironolactone 50 mg daily   - metolazone 5 mg x1 this am   - continue  PAH PRA with flolan (currently at 35ng/kg/min) and PDE-I with sildenafil 80 TID; opsumit stopped    -- note, since admission the flolan dose has been increased and opsumit is a new medication for her     # Acute on chronic hypoxemic respiratory failure: secondary to underlying PAH with likely destruction of cap-alveolar membrane and subsequent diffusion impairment (Fick's Law). 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 Stable on 3 LPM  - continue supplemental O2 as needed  - continue treatment of PAH, as above   - continue diuresis as  outlined above   - continue steroids, started 5/28  - would benefit from sleep study     # ARF on CKD: baseline likely 1.1 - 1.2, currently 1.45. Likely cardiorenal with acute worsening due to poor response to diuresis. Active sediment with proteinuria and hematuria as well as + LE, query infection; etiology such as GN, AIN on ddx as well. 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 Less likely PLCH, LAM, BDH  - solumedrol started 5/28, continue for now   - outpatient follow up with rheumatology and pulmonary  - will need repeat imaging down the line     # Anemia/Thrombocytopenia:ongoing, although stable lines this am. Appears to be a chronic proces, ? Bone marrow suppression. No sign of infectious process, TSH 3.49, Fe 26, Transferritin 191, ferritin 47; RBC folate >1240B12 265(5/22)  - repeat CBC in the am   - continue oral Fe and B12  - may need bone marrow biopsy, defer for now     # Elevated XB:JYNWGN congestive hepatopathy. LFT's stable on last check   - continue treatment of R heart failure   - monitor LFT's    # Gout: last uric acid 8.6 (5/21), improved from 11.8 (4/24)  -  continue uloric for now     # FEN:  - 2 gram Na+   - continue KCl 40 mEq daily     # PPx:  - DVT: LMWH 40 daily; monitor renal function  - Stress Ulcer: none   - Bowels: docusate   - Prn: APAP, zofran    # Code:  - FC/FT     # Dispo:  - ongoing level of care    # Misc:  - labs/imaging: ordered   - consults: none   - invasion: central access via L sided tunneled SC line   - point of contact: [name] (husband) - [phone]; not at bedside this am     This patient was seen and discussed with Dr. [name].    Alfonse Ras, M.D.  Fellow  Department of Pulmonary and  Critical Care Medicine  [phone]    Pulmonary Vascular Attending Addendum:  Patient was examined with Dr. Candis Schatz, imaging and tests reviewed.  Please see above note for full details and plan.    66 yo F with WHO I PAH associated with CTD admitted following RHC 5/15/18with acute on chronic right heart failure and volume overload. Also has background of CTD associated ILD. Chest imaging with possible LIP although has had minimal changes on CT scan over numerous years. Now with refractory edema/anasarca that is poorly responsive to diuretics with and without dopamine support. Amount of edema and anasarca seems to be in excess of the hemodynamic impairment observed on RHC 05/27/16. Now also with acute on chronic kidney injury. Rising BUN. No other obvious etiology for refractory edema identified. Serum protein and albumin unremarkable. No protein on U/A x2 but close to 1 gm on 24 hour urine protein.  SPEP UPEP pending.    -Continue day 3 trial of solumedrol 60 mg IV daily to treat underlying inflammatory etiology for decompensation (LIP and ? Other organ involvement)  -Cr improving.  -Continue IV lasix TID with metolazone  -d/c macitentan for unlikely but possible contribution to edema (lat dose 06/08/16)   -Continue prior home digoxin 250 mcg PO QDAY  -ECHO with stable RVSP.  Improved TAPSE.    -Continue IV epo 35ng/kg/min.  -Continue sildenafil 80 mg TID  -Continue uloric for gout  -LMWH for DVT prophylaxis  -Follow thrombocytopenia    [name] M.D.  Pulmonary and Critical Care Attending  Pager [phone]

## 2016-06-11 NOTE — Interdisciplinary (Signed)
Sign off to ryan rn  Checked flolan

## 2016-06-11 NOTE — Progress Notes (Signed)
ATTENDING PROGRESS NOTE ATTESTATION     Subjective   I have reviewed the history. Can wiggle her toes more today, which she was not able to do as well yesterday due to swelling. Has compression stockings on now as well    Objective   I have examined the patient and concur with the resident/fellow exam.   Awake alert no apparent distress, on O2  Anicteric MMM  Regular rate and rhythm  Few fine rales in L base  abd soft NT  3-71mm edema, feet puffy      Labs reviewed     Assessment and Plan   [phone] woman with PAH 2/2 CTD (Sjogrens/lupus) with refractory edema and diffuse anasarca with acute kidney injury on chronic kidney disease.  Risk factors for chronic kidney disease include right heart failure, high uric acid levels, and possibly Sjogren's.  There were two normal appearing UAs this admit, prior to foley placement, so the cells and protein on the 5/28 sample are likely related to mild trauma from foley vs urinary infection. UA detects only albumin, and 24h total protein detects all proteins, which could be tubular proteins or from cells in urine, however agree with spep/IF and kappa/lambda, consider also sending IgG4. Even though dsDNA and ANA high, c3/c4 are normal, making active lupus much less likely.    She appears to be total body fluid overloaded, yet rising BUN (steroids could be contributing as well), calcium, and bicarbonate might suggest she is intravascularly "dry", which the UNa confirms that her kidneys are detecting a pre renal state (UNa undetectable despite furosemide 100mg  iv q8h).   Perhaps it is her worsened renal failure this admission that has made her less responsive to diuretics, compared to her last admission. Her creatinine today is actually improved.  Continue diuresis.  I agree with the resident/fellow care plan.     See the resident/fellow note for further details.

## 2016-06-11 NOTE — Progress Notes (Signed)
Diana Chambers NOTE  Current Hospital Stay:   15 days - Admitted on: 05/27/2016    ID: 66 year old woman with suspected CKD, WHO I PAH (connetive tissue disease) and Sjogren's syndrome admitted for volume overload and with 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    24 Hour/Subjective:  No acute events, diuresed well    She feels about the same today, LE edema somewhat improved w/ compression stockings. Breathing at her baseline.    OBJECTIVE:  Vital signs:  Temperature:  [97.9 F (36.6 C)-98.6 F (37 C)] 97.9 F (36.6 C) (05/30 1228)  Blood pressure (BP): (112-136)/(53-69) 136/69 (05/30 1228)  Heart Rate:  [90-100] 92 (05/30 1228)  Respirations:  [18-22] 22 (05/30 1228)  Pain Score: 0 (05/30 1228)  O2 Device: Nasal cannula (05/30 1228)  O2 Flow Rate (L/min):  [3 l/min] 3 l/min (05/30 1228)  SpO2:  [93 %-96 %] 93 % (05/30 1228)    24-Hour Input/Output:  Yesterday's intake and output:  05/29 0600 - 05/30 0559  In: 1144.2 [P.O.:1090; I.V.:54.2]  Out: 950 [Urine:950]    Physical Exam:  Gen: Sitting up in a chair, comfortable, on nasal cannula O2, able to speak in full short sentences  HEENT: PEERL EOMI MMM, JVD not appreciated  CV: RRR, prominent sounds in L 2nd interspace, no obvious murmurs; L chest catheter  Resp: Mild rales anterior and posterior lung fields, no wheezing  Abd: Soft, non-tender, non-distended  GU: Foley in place  Skin: erythematous but no obvious rashes  Ext: 3-28mm edema b/l LE to knees w/ compression stockings in place    Notable Data:  BMP:  138/4.0/96/--/99/1.45/81 (05/30 0549)     06/10/2016 17:25   Chloride, Urine <20   Sodium, Urine <20   Potassium, Urine 38     Kappa  91 (H)  Lambda  45 (H)  Ratio  2:0 (H)    SPEP   06/10/2016 06:03   Albumin, EPG 3.28   Alpha 1, EPG 0.30   Alpha 2, EPG 0.68   Beta, EPG 0.90   Gamma, EPG 2.05 (H)   Total Protein, EPG 7.20   A/G Ration, EPG 0.84     UPEP   06/10/2016 13:50   Albumin, Urine 27.3   Alpha 1, Urine 0.6   Alpha 2, Urine 7.5     Beta, Urine 4.2   Gamma, Urine 18.5   Total Protein, UPEP 58.0     Micro:  HBsAg:  Non-reactive  HBxAb: Not detected    Assessment/Recommendations:  Diana Chambers is a 66 year old woman with suspected CKD, WHO I PAH (connetive tissue disease) and Sjogren's syndrome admitted for volume overload and with 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    # AKI on CKD: Likely pre-renal and due to relative intravascular depletion w/ total body fluid overload. Her urine chemistry had very low sodium despite being on significant diuretics. Also, her renal function improved slightly today w/ diuresis and net negative fluid balance. UA still has cells but foley remains in place.  - Continue Lasix 100 mg IV TID  - Consider Metolazone 5 mg po qday  - I/O goal net negative 1L  - Strict I/O, daily standing weights w/ same scale  - She should follow up w/ [place] [facility], Merleen Nicely on Wednesdays (when she is here for [facility] and Rheum visits)    # Hypercalcemia: suspicious finding, not markedly elevated but above normal particularly w/ her low albumin. She has elevation in both light chains but kappa predominance (2:0).   -  Please send ionized [place] to further characterize  - She may benefit from heme/onc input on this  - f/u immunofixation    HCM:  - Consider outpatient Hep B vaccination series as outpatient (not immune)    Plan discussed with the attending, Dr. Laurin Coder.    Jeanie Sewer, MD  Internal Medicine PGY3  Nephrology Team  Pager: [phone]

## 2016-06-11 NOTE — Plan of Care (Signed)
Problem: Fluid Volume Excess  Goal: Absence of edema; peripheral and/or pulmonary  Outcome: Plan altered due to change in condition.  Wt up     Problem: Breathing Pattern - Ineffective  Goal: Respiratory rate, rhythm and depth return to baseline  Outcome: Unable to meet goal at this time.  Rales in bases  3 liters nc     Problem: Tissue Perfusion, Cardiopulmonary - Altered  Goal: Hemodynamic stability    Intervention: Ensure that hemodynamic monitoring, vital signs, accurately reflects patient's status  SR ST with bp stable  Edema 2-3n +

## 2016-06-12 ENCOUNTER — Encounter (HOSPITAL_COMMUNITY): Payer: Self-pay

## 2016-06-12 LAB — CBC WITH DIFF, BLOOD
ANC-Manual Mode: 4.2 10*3/uL (ref 1.6–7.0)
Abs Lymphs: 1 10*3/uL (ref 0.8–3.1)
Abs Monos: 0.1 10*3/uL — ABNORMAL LOW (ref 0.2–0.8)
Hct: 34.1 % (ref 34.0–45.0)
Hgb: 10.2 gm/dL — ABNORMAL LOW (ref 11.2–15.7)
Lymphocytes: 18 %
MCH: 24.3 pg — ABNORMAL LOW (ref 26.0–32.0)
MCHC: 29.9 g/dL — ABNORMAL LOW (ref 32.0–36.0)
MCV: 81.4 um3 (ref 79.0–95.0)
MPV: 12.4 fL (ref 9.4–12.4)
Monocytes: 2 %
Plt Count: 87 10*3/uL — ABNORMAL LOW (ref 140–370)
RBC: 4.19 10*6/uL (ref 3.90–5.20)
RDW: 18 % — ABNORMAL HIGH (ref 12.0–14.0)
Segs: 76 %
WBC: 5.4 10*3/uL (ref 4.0–10.0)

## 2016-06-12 LAB — URINALYSIS
Bilirubin: NEGATIVE
Glucose: NEGATIVE
Ketones: NEGATIVE
Nitrite: NEGATIVE
Protein: NEGATIVE
RBC: >50 — AB (ref 0–?)
Specific Gravity: 1.009 (ref 1.002–1.030)
Urobilinogen: NEGATIVE
pH: 6 (ref 5.0–8.0)

## 2016-06-12 LAB — BASIC METABOLIC PANEL, BLOOD
Anion Gap: 13 mmol/L (ref 7–15)
BUN: 102 mg/dL — ABNORMAL HIGH (ref 8–23)
Bicarbonate: 30 mmol/L — ABNORMAL HIGH (ref 22–29)
Calcium: 10.6 mg/dL (ref 8.5–10.6)
Chloride: 94 mmol/L — ABNORMAL LOW (ref 98–107)
Creatinine: 1.42 mg/dL — ABNORMAL HIGH (ref 0.51–0.95)
GFR: 37 mL/min
Glucose: 101 mg/dL — ABNORMAL HIGH (ref 70–99)
Potassium: 3.2 mmol/L — ABNORMAL LOW (ref 3.5–5.1)
Sodium: 137 mmol/L (ref 136–145)

## 2016-06-12 LAB — MDIFF
Bands: 1 % (ref 0–15)
Immature Granulocytes Absolute Manual: 0.2 10*3/uL — ABNORMAL HIGH (ref 0.0–0.1)
Metamyelocytes: 1 %
Myelocytes: 2 %
Number of Cells Counted: 114
Plt Est: DECREASED

## 2016-06-12 LAB — EPG, INTERPRETATION, SERUM

## 2016-06-12 LAB — EPG, SERUM
A/G Ratio, EPG: 0.81
Albumin, EPG: 3.14 gm/dL — ABNORMAL LOW (ref 3.20–5.00)
Alpha 1, EPG: 0.23 gm/dL (ref 0.10–0.40)
Alpha 2, EPG: 0.66 gm/dL (ref 0.60–1.10)
Beta, EPG: 0.87 gm/dL (ref 0.60–1.30)
Gamma, EPG: 2.1 gm/dL — ABNORMAL HIGH (ref 0.70–1.50)
Total Protein, EPG: 7 gm/dL (ref 6.00–8.00)

## 2016-06-12 LAB — IMMUNOGLOBULIN G SUBCLASS 4: IGG Subclass 4: 36 mg/dL (ref 1–123)

## 2016-06-12 LAB — MAGNESIUM, BLOOD: Magnesium: 2.5 mg/dL — ABNORMAL HIGH (ref 1.6–2.4)

## 2016-06-12 LAB — PHOSPHORUS, BLOOD: Phosphorous: 4 mg/dL (ref 2.7–4.5)

## 2016-06-12 MED ORDER — CEFTRIAXONE SODIUM 1 GM IJ SOLR
1000.00 mg | INTRAMUSCULAR | Status: AC
Start: 2016-06-12 — End: 2016-06-14
  Administered 2016-06-12 – 2016-06-14 (×3): 1000 mg via INTRAVENOUS
  Filled 2016-06-12 (×4): qty 1000

## 2016-06-12 MED ORDER — POTASSIUM CHLORIDE CRYS CR 10 MEQ OR TBCR
40.00 meq | EXTENDED_RELEASE_TABLET | Freq: Once | ORAL | Status: AC
Start: 2016-06-12 — End: 2016-06-12
  Administered 2016-06-12: 40 meq via ORAL
  Filled 2016-06-12: qty 4

## 2016-06-12 NOTE — Interdisciplinary (Signed)
Spoke with patient and husband about the sleep study, and pt does not feel comfortable wearing sleep study. Pt refused.

## 2016-06-12 NOTE — Plan of Care (Signed)
Problem: Fluid Volume Excess  Goal: Absence of edema; peripheral and/or pulmonary  Outcome: Progressing toward goal, anticipate improvement over: >48 hours  On intermittent IV lasix dose with good amt of UO. VS remain stable no acute cg.

## 2016-06-12 NOTE — Progress Notes (Signed)
[place] NOTE  Current Hospital Stay:   16 days - Admitted on: 05/27/2016    ID: 66 year old woman with suspected CKD, WHO I PAH (connetive tissue disease) and Sjogren's syndrome admitted for volume overload and with 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    24 Hour/Subjective:  No acute events, diuresed briskly    She reports slight dizziness this AM but tolerating sitting in a chair w/o difficulty. She thinks she can ambulate to restroom to void, open to the idea of removing the catheter.    OBJECTIVE:  Vital signs:  Temperature:  [97.7 F (36.5 C)-98.7 F (37.1 C)] 98.4 F (36.9 C) (05/31 1200)  Blood pressure (BP): (125-137)/(50-73) 131/65 (05/31 1200)  Heart Rate:  [89-100] 98 (05/31 1200)  Respirations:  [16-22] 19 (05/31 1200)  Pain Score: 0 (05/31 1200)  O2 Device: Nasal cannula (05/31 1200)  O2 Flow Rate (L/min):  [3 l/min] 3 l/min (05/31 1200)  SpO2:  [93 %-97 %] 94 % (05/31 1200)    24-Hour Input/Output:  Yesterday's intake and output:  05/30 0600 - 05/31 0559  In: 1412.1 [P.O.:1370; I.V.:42.1]  Out: 4375 [Urine:4375]    Physical Exam:  Gen: Sitting up in a chair, comfortable, on nasal cannula O2, able to speak in full short sentences  HEENT: PEERL EOMI MMM, JVD not appreciated  CV: RRR, prominent sounds in L 2nd interspace, no obvious murmurs; L chest catheter  Resp: Mild rales anterior and posterior lung fields, no wheezing  Abd: Soft, non-tender, non-distended  GU: Foley in place  Skin: erythematous but no obvious rashes  Ext: ~2 mm edema b/l LE to knees w/ compression stockings in place    Notable Data:  BMP:  137/3.2/94/--/102/1.42/101 (05/31 6213)     06/10/2016 17:25   Chloride, Urine <20   Sodium, Urine <20   Potassium, Urine 38     Kappa  91 (H)  Lambda  45 (H)  Ratio  2:0 (H)    SPEP   06/10/2016 06:03   Albumin, EPG 3.28   Alpha 1, EPG 0.30   Alpha 2, EPG 0.68   Beta, EPG 0.90   Gamma, EPG 2.05 (H)   Total Protein, EPG 7.20   A/G Ration, EPG 0.84     UPEP   06/10/2016 13:50   Albumin, Urine 27.3   Alpha 1, Urine 0.6   Alpha 2, Urine 7.5   Beta, Urine 4.2   Gamma, Urine 18.5   Total Protein, UPEP 58.0     Micro:  UCx 5/29: Staph aureus  HBsAg:  Non-reactive  HBxAb: Not detected    Assessment/Recommendations:  Diana Chambers is a 66 year old woman with suspected CKD, WHO I PAH (connetive tissue disease) and Sjogren's syndrome admitted for volume overload and with 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    # AKI on CKD: Likely pre-renal and due to relative intravascular depletion w/ total body fluid overload. Her urine chemistry had very low sodium despite being on significant diuretics. Also, her renal function improved slightly today w/ diuresis and net negative fluid balance. UA still has cells but foley remains in place. The increased response to diuretics likely to some renal recovery after a mild AKI.  - DC Metolazone  - Can consider decreasing lasix to 80 mg IV BID-TID (ease of dosing w/ push, may still be ceiling dose for her)  - I/O goal net negative 1L- can DC foley  - Repeat urine protein to Cr ratio after resolution of UTI  - Strict  I/O, daily standing weights w/ same scale  - She should follow up w/ [place] nephrology, Merleen Nicely on Wednesdays (when she is here for [facility] and Rheum visits)    # Staph aureus Bacteruria, Suspected UTI: unclear of the source of this, either from foley insertion or from bloodstream seeding. She does have chronic indwelling lines.  - Consider Blood Cultures  - Abx and treatment per primary team  - Repeat UA ~1 day after foley removal    # Hypercalcemia: SPEP/UPEP and Immunofixation not consistent w/ a paraproteinemia. The Kappa to Lambda 2:1 ratio can be seen in chronic kidney disease due to Kappa's larger size and predilection to not be filtered.     HCM:  - Consider outpatient Hep B vaccination series as outpatient (not immune)    Plan discussed with the attending, Dr. Laurin Coder.    Jeanie Sewer, MD  Internal Medicine  PGY3  Nephrology Team  Pager: [phone]

## 2016-06-12 NOTE — Progress Notes (Signed)
Pulmonary Hypertension Service Progress Note    Date of Admission: 05/27/2016    Date of Service: 06/12/2016    Chief Complaint: "My swelling is better"    Subjective/HPI:   Feeling better - swelling is improving. Breathing about the same. Denies fevers/chills. Appetite is better. Denies chest pain.       Current Facility-Administered Medications   Medication    acetaminophen (TYLENOL) tablet 650 mg    digoxin (LANOXIN) tablet 250 mcg    docusate sodium (COLACE) capsule 250 mg    enoxaparin (LOVENOX) injection 40 mg    epoprostenol (FLOLAN) 60,000 ng/mL in glycine diluent 100 mL infusion    febuxostat (ULORIC) tablet 40 mg    ferrous sulfate EC tablet 735 mg    folic acid (FOLVITE) tablet 0.5 mg    furosemide (LASIX) 100 mg in sodium chloride 0.9 % 50 mL IVPB    medroxyPROGESTERone (PROVERA) tablet 1.25 mg    methylPREDNISolone sodium succinate (SOLU-MEDROL) injection 60 mg    nalOXone (NARCAN) injection 0.1 mg    ondansetron (ZOFRAN) injection 4 mg    potassium chloride (KLOR-CON) ER tablet 40 mEq    Prostacyclin Cassette Change    Prostacyclin Ice Pack    Prostacyclin Tubing    Prostacylin Batteries    sildenafil (REVATIO, VIAGRA) tablet 80 mg    sodium chloride (PF) 0.9 % flush 3 mL    sodium chloride (PF) 0.9 % flush 3 mL    sodium chloride 0.9 % TKO infusion    spironolactone (ALDACTONE) tablet 50 mg    vitamin b-12 (CYANOCOBALAMIN) tablet 500 mcg     Review of Systems:  - Constitutional: denies fevers, chills and night sweats   - Cardiac: denies chest pain, palpitations; +edema, improving  - Respiratory: +dyspnea with exertion, stable at rest; denies coughing or hemoptysis  - GI: denies nausea, vomiting, abdominal pain  - Skin: denies rashes or ecchymoses   - Neuro: denies headaches, parasthesias or paresis   - MSK: denies myalgias or arthralgias     Objective  Temperature:  [97.7 F (36.5 C)-98.7 F (37.1 C)] 97.7 F (36.5 C) (05/31 0800)  Blood pressure (BP):  (125-137)/(50-73) 137/68 (05/31 0800)  Heart Rate:  [89-100] 92 (05/31 0745)  Respirations:  [16-22] 19 (05/31 0800)  Pain Score: 0 (05/31 0800)  O2 Device: Nasal cannula (05/31 0800)  O2 Flow Rate (L/min):  [3 l/min] 3 l/min (05/31 0800)  SpO2:  [93 %-97 %] 95 % (05/31 0800)  I/O: 1412/4375 = -2962cc/hour  UOP of 4375cc/24 hours  Wt: 45.6 kg (77.3kg, 5/30)    Physical Exam  General: sitting in the chair comfortably. Chronically ill appearing. Husband at bedside.   HEENT: NC/AT. PERRL, EOMI. Sclera anicteric. Moist oral mucosa.   Neck: + JVD  CV: RRR, loud S2  Lungs: Good effort. Bibasilar crackles. Moving air well.   Abdomen: Soft, mild distention, NT, + BS. No guarding or rebound  Extremities: 2-3+ LEedema. TED Hose on. Warm distal extremities.   Skin: maculopapular rash over LE's, stable   Neuro: CN II-XII grossly intact, strength grossly intact  Psych: flat affect     Labs  Recent Labs      06/10/16   0950  06/11/16   0549  06/12/16   0605   NA  134*  138  137   K  3.8  4.0  3.2*   CL  93*  96*  94*   BICARB  28  28  30*   BUN  94*  99*  102*   CREAT  1.58*  1.45*  1.42*     10.2  10.7*  10.6   MG   --   2.6*  2.5*   PHOS   --   4.0  4.0   TP  7.4   --    --    ALB  3.7   --    --    TBILI  0.35   --    --    AST  21   --    --    ALT  15   --    --    ALK  155*   --    --        Recent Labs      06/10/16   0604  06/11/16   0549  06/12/16   0605   WBC  4.0  5.0  5.4   HGB  9.7*  10.1*  10.2*   HCT  32.3*  32.4*  34.1   MCV  81.0  81.2  81.4   PLT  81*  94*  87*   SEG  72  92  76   LYMPHS  17  5  18    MONOS  7  2  2        No results for input(s): PTT, INR in the last 72 hours.    No results for input(s): CPK, CKMB[facility], TROPONIN in the last 72 hours.    No results for input(s): LACTATE in the last 72 hours.    Pertinent Micro  - Ucx pending from 5/29 with > 100,000 CFU's of staph aureus  - Ucx from 5/30 pending     Antibiotics  - none     Pertinent Imaging  - none new this am     - CT A/P non-contrast  (5/26):  CT scan of the abdomen and pelvis without IV contrast  Sequelae of volume overload, likely secondary to right heart dysfunction.  No intra-abdominal mass is identified.  Cholelithiasis without evidence of acute cholecystitis.  Findings of pulmonary arterial hypertension are better evaluated on CT thorax from 05/04/2016.  Note that assessment of solid organs is limited in the absence of intravenous contrast.    - Abd U/S (05/29/2016):  Small amount of perihepatic ascites.  Cholelithiasis without evidence of acute cholecystitis.  Hepatosplenomegaly    - CT Chest w/o IVC (05/04/2016):  - Radiology Impression:  1. Severe enlargement of the pulmonary artery with increased calcification along the pulmonary arterial walls compatible with longstanding pulmonary hypertension. Progressed parenchymal changes of the lungs compatible with pulmonary hypertension.  2. Increased thin wall l cystic esions seen within the lungs, nonspecific be can be seen with underlying lymphocytic interstitial pneumonia, which can be seen in patients with lupus/Sjogren's disease.  3. Cardiomegaly with evidence of right heart dysfunction. Trace nonspecific pericardial fluid with pericardial thickening suspected. In a patient with known lupus underlying pericarditis/serositis may be present. Correlation with echocardiogram is recommended.  4. Likely reactive adenopathy of the thorax.  5. Additional findings as detailed above.  - Independently Reviewed:      Misc:  - TTE (06/10/2016):  1. The left ventricular size is normal and the left ventricular systolic function is normal.  2. Mild or grade I (impaired relaxation pattern) left ventricular diastolic filling.  3. The right ventricular size is severely enlarged and systolic function is reduced.  4. Moderate tricuspid regurgitation.  5. The aortic valve is mildly stenotic.  6. Severe pulmonary hypertension with right ventricular systolic pressure measuring 75 mmHg.  7. Compared to  prior study no significant change.    - TTE (05/12/2016):  1. The left ventricular size is normal and the left ventricular systolic function is normal.  2. The right ventricular size is moderately enlarged and systolic function is reduced.  3. Moderate tricuspid regurgitation.  4. Severe pulmonary hypertension with right ventricular systolic pressure measuring 70 mmHg.  5. Compared to prior study , PA pressures are slightly lower.    - RHC (05/27/2016):    - prior RHC done 02/2016 with PAP 97/35/58, PVR 584 D (7.30 WU), fCO/CI 6.85/3.9, tdCO/CI 6.17/3.51    ASSESSMENT AND PLAN  Diana Chambers is a 39 year [place] old female with SLE/Sjogren's and WHO Group I PAH who was admitted 5/15 after RHC was concerning for worsening hemodynamics. She has been on triple therapy including flolan although opsumit stopped. Her problem list is as follows:     # WHO Group I PAH with RV Overload: underlying PAH in the setting of prior anorexigen use and CTD (Sjogrens). RHC 5/15 with PAP 92/37/62, PAOP 12, tdPVR 9.28 WU and normal CO/CI. Currently on flolan 35ng/kg/min. Remains volume overloaded; refractory to diuresis for several days although improving as of 5/30 with good UOP. Repeat TTE 5/29 unchanged   - continue diuresis with lasix 100 IV q8h and oral spironolactone 50 mg daily   - no additional metolazone today   - continue PAHtreatment with PRA with flolan (currently at 35ng/kg/min) and PDE-I with sildenafil 80 TID; opsumit stopped(continue to hold)  -- note, since admission the flolan dose has been increased and opsumit is a new medication for her     # Acute on chronic hypoxemic respiratory failure: secondary to underlying PAH with likely destruction of cap-alveolar membrane and subsequent diffusion impairment (Fick's Law). 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 Stable on 3 LPM  - continue supplemental O2 as needed  - continue treatment of PAH, as above   - continue diuresis as outlined above   - continue steroids, started 5/28  - would benefit from sleep study     # ARF on CKD: baseline likely 1.1 - 1.2, currently 1.452 and stable. Likely cardiorenal with acute worsening due to poor response to diuresis. Active sediment with proteinuria and hematuria as well as + LE, query infection given > 100,000 staph (afebrile and normal WBC count); etiology such as GN, AIN on ddx as well. 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 Less likely PL[facility], LAM, BDH  - solumedrol started 5/28, continue for now   - outpatient follow up with rheumatology and pulmonary  - will need repeat imaging down the line     # Anemia/Thrombocytopenia:ongoing, although stable lines this am. Appears to be a chronic proces, ? Bone marrow suppression. No sign of infectious process, TSH 3.49, Fe 26, Transferritin 191, ferritin 47; RBC folate >1240B12 265(5/22)  - repeat CBC in the am   - continue  oral Fe and B12  - may need bone marrow biopsy, defer for now     # Elevated YK:DXIPJA congestive hepatopathy. LFT's stable on last check   - continue treatment of R heart failure   - monitor LFT's    # Gout: last uric acid 8.6 (5/21), improved from 11.8 (4/24)  - continue uloric for now     # FEN:  - 2 gram Na+   - continue KCl 40 mEq daily     # PPx:  - DVT: LMWH 40 daily; monitor renal function  - Stress Ulcer: none   - Bowels: docusate   - Prn: APAP, zofran    # Code:  - FC/FT     # Dispo:  - ongoing level of care    # Misc:  - labs/imaging: ordered   - consults:  none   - invasion: central access via L sided tunneled SC line   - point of contact: [name] (husband) - [phone]; at bedside this am and updated on the plan of care     This patient was seen and discussed with Dr. [name].    Alfonse Ras, M.D.  Fellow  Department of Pulmonary and Critical Care Medicine  [phone]      Pulmonary Vascular Attending Addendum:  Patient was examined with Dr. Candis Schatz, imaging and tests reviewed.  Please see above note for full details and plan.    66 yo F with WHO I PAH associated with CTD admitted following RHC 5/15/18with acute on chronic right heart failure and volume overload. Also has background of CTD associated ILD. Chest imaging with possible LIP although has had minimal changes on CT scan over numerous years. Now with refractory edema/anasarca that is poorly responsive to diuretics with and without dopamine support. Amount of edema and anasarca seems to be in excess of the hemodynamic impairment observed on RHC 05/27/16. Now also with acute on chronic kidney injury. Cr improving but BUN rising.     -Continue day 4trial of solumedrol 60 mg IV daily to treat underlying inflammatory etiology for decompensation (LIP and ? Other organ involvement)  -Cr improving.  -Continue IV lasix TID with metolazone  -d/c macitentan for unlikely but possible contribution to edema (lat dose 06/08/16)   -Continue prior home digoxin 250 mcg PO QDAY  -ECHO with stable RVSP -Improved TAPSE   -Continue IV epo 35ng/kg/min  -Continue sildenafil 80 mg TID  -Continue uloric for gout  -LMWH for DVT prophylaxis  -Follow thrombocytopenia    [name] M.D.  Pulmonary and Critical Care Attending  Pager [phone]

## 2016-06-12 NOTE — Plan of Care (Addendum)
Problem: Fluid Volume Excess  Goal: Absence of edema; peripheral and/or pulmonary  Outcome: Progressing toward goal, anticipate improvement over: >48 hours  Pt being aggressively diuresed with IVPB lasix q 6 hrs and has been voiding adequate amounts.  Pt on 1.5L fluid restriction/day and has been compliant thus far.  VSS and pt denies any dizzyness when OOB/ambulating.  Will continue to monitor    Problem: Breathing Pattern - Ineffective  Goal: Respiratory rate, rhythm and depth return to baseline  Outcome: Goal Met  Pt with 02 sats>92% on 3 L NC and have no had to titrate up on 02.  RR WNL but has mild dyspnea with activity.  Pt without any s/s respiratory distress.    Problem: Tissue Perfusion, Cardiopulmonary - Altered  Goal: Hemodynamic stability  Outcome: Goal Met  Vitals remain stable and monitoring q4hrs.  Pt in NSR with no ectopy on continuous telemetry monitoring.  Palpable pulses with brisk cap refill.  +2 pitting edema but improving. Flolan infusing at 72 ml/24 hrs (35 ng/kg/min) and pt tolerating well.  Pt denies any chest pain/palpitations or new SOB.  Will notify any significant cardiac chan ges to MD.    Problem: Falls, Risk of  Goal: Keep patient free from falls utilizing universal fall precautions  Outcome: Goal Met  Pt with call bell and personal items in reach, side rails x2 and bed locked in lowest position.  High fall risk yellow arm band and socks in place.  Pt calls appropriately when needs assistance and husband also at bedside to assist patient when OOB.  Safety maintained this shift.

## 2016-06-12 NOTE — Plan of Care (Signed)
Problem: Fluid Volume Excess  Goal: Absence of edema; peripheral and/or pulmonary  Outcome: Progressing toward goal, anticipate improvement over: Next 24-48 hours  Strict I&O and daily weights in place. Patient on 1.5L/day FR; patient educated on FR and verbalizes understanding. Cooperative with POC.     Problem: Breathing Pattern - Ineffective  Goal: Respiratory rate, rhythm and depth return to baseline  Outcome: Progressing toward goal, anticipate improvement over: Next 24-48 hours  Respirations even & unlabored. Reports mild/occasional DOE when up ambulating hallway. O2 sats >94% on 3L NC. Patient resting comfortably, no respiratory distress. Flolan infusing as ordered.     Problem: Tissue Perfusion, Cardiopulmonary - Altered  Goal: Hemodynamic stability  Outcome: Goal Met  Patient A&Ox4. VS stable and WNL. NSR on tele. Patient denies chest pain/pressure, denies SOB. Resting comfortably in no acute distress. Cont to monitor; cont hourly and prn rounds to assess for patient needs/safety.   Goal: Early recognition of deterioration  Outcome: Goal Met      Problem: Falls, Risk of  Goal: Keep patient free from falls utilizing universal fall precautions  Outcome: Goal Met  Patient remains free from falls or injury. Patient verbalizes understanding of high fall risk and calls for assistance oob. Patient has been oriented to room. Fall precautions implemented bed is in low/locked position, call light & tray table within reach, side rails up x2. Wearing nonskid footwear and white board in use. Hourly and prn rounds to assess for patient needs/safety. Patient room kept free and clear of clutter. Patient refuses bed alarm; educated on risk, verbalizes understanding and states she will use call light before getting oob & when in need of staff assistance.

## 2016-06-13 LAB — CBC WITH DIFF, BLOOD
ANC-Manual Mode: 4.3 10*3/uL (ref 1.6–7.0)
Abs Lymphs: 1.3 10*3/uL (ref 0.8–3.1)
Abs Monos: 0.4 10*3/uL (ref 0.2–0.8)
Hct: 33.9 % — ABNORMAL LOW (ref 34.0–45.0)
Hgb: 10.2 gm/dL — ABNORMAL LOW (ref 11.2–15.7)
Lymphocytes: 21 %
MCH: 24.5 pg — ABNORMAL LOW (ref 26.0–32.0)
MCHC: 30.1 g/dL — ABNORMAL LOW (ref 32.0–36.0)
MCV: 81.5 um3 (ref 79.0–95.0)
Monocytes: 7 %
NRBC: 1 /100 WBC (ref <1)
Plt Count: 80 10*3/uL — ABNORMAL LOW (ref 140–370)
RBC: 4.16 10*6/uL (ref 3.90–5.20)
RDW: 17.8 % — ABNORMAL HIGH (ref 12.0–14.0)
Segs: 71 %
WBC: 6.1 10*3/uL (ref 4.0–10.0)

## 2016-06-13 LAB — MAGNESIUM, BLOOD: Magnesium: 2.4 mg/dL (ref 1.6–2.4)

## 2016-06-13 LAB — BASIC METABOLIC PANEL, BLOOD
Anion Gap: 11 mmol/L (ref 7–15)
Anion Gap: 12 mmol/L (ref 7–15)
BUN: 104 mg/dL — ABNORMAL HIGH (ref 8–23)
BUN: 105 mg/dL — ABNORMAL HIGH (ref 8–23)
Bicarbonate: 35 mmol/L — ABNORMAL HIGH (ref 22–29)
Bicarbonate: 32 mmol/L — ABNORMAL HIGH (ref 22–29)
Calcium: 10.5 mg/dL (ref 8.5–10.6)
Calcium: 10.8 mg/dL — ABNORMAL HIGH (ref 8.5–10.6)
Chloride: 96 mmol/L — ABNORMAL LOW (ref 98–107)
Chloride: 91 mmol/L — ABNORMAL LOW (ref 98–107)
Creatinine: 1.44 mg/dL — ABNORMAL HIGH (ref 0.51–0.95)
Creatinine: 1.32 mg/dL — ABNORMAL HIGH (ref 0.51–0.95)
GFR: 36 mL/min
GFR: 40 mL/min
Glucose: 87 mg/dL (ref 70–99)
Glucose: 200 mg/dL — ABNORMAL HIGH (ref 70–99)
Potassium: 3.5 mmol/L (ref 3.5–5.1)
Potassium: 3.8 mmol/L (ref 3.5–5.1)
Sodium: 142 mmol/L (ref 136–145)
Sodium: 135 mmol/L — ABNORMAL LOW (ref 136–145)

## 2016-06-13 LAB — MDIFF
Immature Granulocytes Absolute Manual: 0.1 10*3/uL (ref 0.0–0.1)
Metamyelocytes: 1 %
Number of Cells Counted: 115
Plt Est: DECREASED

## 2016-06-13 LAB — PHOSPHORUS, BLOOD: Phosphorous: 3.7 mg/dL (ref 2.7–4.5)

## 2016-06-13 LAB — URINE CULTURE

## 2016-06-13 LAB — CALCIUM, IONIZED BLOOD: Ca, Ionized: 1.4 mMol/L — ABNORMAL HIGH (ref 1.13–1.32)

## 2016-06-13 NOTE — Interdisciplinary (Signed)
Patient's CADD pump defective- alarming "No disposable clamp tubing." & shutting down.  Received new CADD pump from [place], Pharmacist & replaced defective CADD pump with new one.  Defective CADD pump placed in box given by pharmacy- given to Tim, pharmacist.  Pharmacy to send defective pump to [facility].  Patient to keep new pump per Tim, pharmacist.

## 2016-06-13 NOTE — Plan of Care (Signed)
Problem: Fluid Volume Excess  Goal: Absence of edema; peripheral and/or pulmonary  Outcome: Progressing toward goal, anticipate improvement over: Next 24-48 hours  Pt is on lasix 100mg  IV Q8H scheduled. K=3.5 this AM and received 64mEq scheduled dose of potassium. Pt has been voiding well. RN paged Dr for more potassium supplement due to 6 beats of VT and waiting for Dr to call back.

## 2016-06-13 NOTE — Interdisciplinary (Signed)
Clinical Nutrition Follow Up:    Assessment: Per MD: 66 year old female former smoker with SLE, chronic hypoxemic respiratory failure, and PAH 2/2 CTD and prior anorexigen use who presents today for Right Heart Catheterization.     Anthropometrics   Height: 5\' 2"  (157.5 cm)   Weight: 74.8 kg (164 lb 14.5 oz)      IBW= 110 lb  %IBW= 149%  Body mass index is 30.16 kg/(m^2).   UBW: 161 lbs (per DTR note on 5/22)  WT Change: pt weight has been fluctuating in the last week   Weights (last 14 days)     Date/Time Weight Weight Source Percentage Weight Change (%) Who    06/13/16 0200 74.8 kg (164 lb 14.5 oz) Standing scale -1.02 % SB    06/12/16 0500 75.6 kg (166 lb 9.6 oz) Standing scale -2.24 % AG    06/11/16 0600 77.3 kg (170 lb 6.7 oz) Standing scale 4.74 % LS    06/10/16 0436 73.8 kg (162 lb 11.2 oz) Standing scale -4.03 % LS    06/09/16 0400 76.9 kg (169 lb 8.5 oz) Standing scale 0.65 % LS    06/08/16 0323 76.4 kg (168 lb 6.9 oz) Standing scale 0.13 % MF    06/07/16 0544 76.3 kg (168 lb 3.4 oz) Standing scale 0.53 % RC    06/06/16 0456 75.9 kg (167 lb 5.3 oz) Standing scale 0.66 % LC    06/05/16 0812 75.4 kg (166 lb 3.6 oz) Standing scale -0.66 % JW    06/05/16 0544 75.9 kg (167 lb 5.3 oz) Bed scale 0.93 % JP    06/04/16 0339 75.2 kg (165 lb 12.6 oz) Standing scale 6.82 % JP    06/03/16 0802 70.4 kg (155 lb 3.3 oz) Standing scale -6.26 % NK    06/03/16 0545 75.1 kg (165 lb 9.1 oz) Standing scale 0.94 % JP    06/02/16 0056 74.4 kg (164 lb 0.4 oz) Standing scale 1.36 % SB    06/01/16 0427 73.4 kg (161 lb 13.1 oz) Standing scale 0.14 % RG    05/31/16 0910 73.3 kg (161 lb 9.6 oz) Standing scale 0.14 % MM    05/30/16 0530 73.2 kg (161 lb 6 oz) Standing scale -0.14 % SB               Diet:Diet Custom: 2 Gram Sodium  PO Intake: pt is consuming an average of 91% x 15 meals, pt reports her current appetite is "good"  Tray Items Taken for the past 168 hrs:   Number of Items Taken Number of Items on Tray Diet Tolerance      06/06/16 1700 3 4 -   06/07/16 0813 3 3 Tolerates   06/07/16 1500 3 3 Tolerates   06/08/16 1000 3 3 Tolerates   06/08/16 1845 4 4 Tolerates   06/09/16 1000 2 3 Tolerates   06/09/16 1427 3 4 Tolerates   06/09/16 1700 4 4 Tolerates   06/10/16 0800 3 5 Tolerates   06/10/16 1114 4 4 Tolerates   06/11/16 0700 3 3 Tolerates   06/11/16 1230 2 2 Tolerates   06/12/16 0800 3 3 Tolerates   06/12/16 1300 4 4 Tolerates   06/12/16 1800 5 5 Tolerates   06/13/16 0823 - - Tolerates   06/13/16 1207 - - Tolerates     No data found.      Allergies/Intolerances:   Allergies   Allergen Reactions    Allopurinol Hives  Levaquin Swelling and Other     Severe joint pain      Ofloxacin Nausea and Vomiting    Chlorhexidine Itching    Sulfa Drugs Unspecified     Chewing/Swallowing difficulty: pt denies chewing and swallowing difficulty   GI: pt reports some diarrhea but denies nausea and vomiting   Skin: Exceptions to WDL, from head to toe assessment   Patient Lines/Drains/Airways Status    Active PICC Line / CVC Line / PIV Line / Drain / Airway / Intraosseous Line / Epidural Line / ART Line / Line Type / Wound     Name: Placement date: Placement time: Site: Days:    CVC Single Lumen - Broviac Left Subclavian 05/27/16      Subclavian   17    Peripheral IV - 22 G Right Wrist 06/05/16   1700   Wrist   7    Peripheral IV - 20 G Right;Upper  Arm 06/07/16   2010   Arm   5                Labs: No results found for: PREALB, A1C Recent Labs      06/11/16   0549  06/12/16   0605  06/13/16   0450  06/13/16   0530   NA  138  137  142   --    K  4.0  3.2*  3.5   --    CL  96*  94*  96*   --    BICARB  28  30*  35*   --    BUN  99*  102*  104*   --    CREAT  1.45*  1.42*  1.44*   --    GLU  81  101*  87   --    Rolfe  10.7*  10.6  10.5   --    MG  2.6*  2.5*  2.4   --    PHOS  4.0  4.0  3.7   --    IONCA   --    --    --   1.40*     BS POCT: No results for input(s): GLUCPOCT in the last 72 hours.    Nutr Related MEDS:   Current Facility-Administered  Medications   Medication    acetaminophen (TYLENOL) tablet 650 mg    cefTRIAXone (ROCEPHIN) 1,000 mg in sodium chloride 0.9 % 50 mL IVPB    digoxin (LANOXIN) tablet 250 mcg    docusate sodium (COLACE) capsule 250 mg    enoxaparin (LOVENOX) injection 40 mg    epoprostenol (FLOLAN) 60,000 ng/mL in glycine diluent 100 mL infusion    febuxostat (ULORIC) tablet 40 mg    ferrous sulfate EC tablet 272 mg    folic acid (FOLVITE) tablet 0.5 mg    furosemide (LASIX) 100 mg in sodium chloride 0.9 % 50 mL IVPB    medroxyPROGESTERone (PROVERA) tablet 1.25 mg    methylPREDNISolone sodium succinate (SOLU-MEDROL) injection 60 mg    nalOXone (NARCAN) injection 0.1 mg    ondansetron (ZOFRAN) injection 4 mg    potassium chloride (KLOR-CON) ER tablet 40 mEq    Prostacyclin Cassette Change    Prostacyclin Ice Pack    Prostacyclin Tubing    Prostacylin Batteries    sildenafil (REVATIO, VIAGRA) tablet 80 mg    sodium chloride (PF) 0.9 % flush 3 mL    sodium chloride (PF) 0.9 % flush 3 mL  sodium chloride 0.9 % TKO infusion    spironolactone (ALDACTONE) tablet 50 mg    vitamin b-12 (CYANOCOBALAMIN) tablet 500 mcg       Education: if/when appropriate   PLAN: Will continue to monitor PO intake and diet tolerance. Will relay relevant, significant pt info and status change to Registered Dietitian.    Alice Rieger, MS DTR

## 2016-06-13 NOTE — Progress Notes (Signed)
Pulmonary Hypertension Service Progress Note    Date of Admission: 05/27/2016    Date of Service: 06/13/2016    Chief Complaint: "My swelling is better"    Subjective/HPI:   Swelling has improved. UOP is high. Denies fevers, night sweats.     Current Facility-Administered Medications   Medication    acetaminophen (TYLENOL) tablet 650 mg    cefTRIAXone (ROCEPHIN) 1,000 mg in sodium chloride 0.9 % 50 mL IVPB    digoxin (LANOXIN) tablet 250 mcg    docusate sodium (COLACE) capsule 250 mg    enoxaparin (LOVENOX) injection 40 mg    epoprostenol (FLOLAN) 60,000 ng/mL in glycine diluent 100 mL infusion    febuxostat (ULORIC) tablet 40 mg    ferrous sulfate EC tablet 387 mg    folic acid (FOLVITE) tablet 0.5 mg    furosemide (LASIX) 100 mg in sodium chloride 0.9 % 50 mL IVPB    medroxyPROGESTERone (PROVERA) tablet 1.25 mg    methylPREDNISolone sodium succinate (SOLU-MEDROL) injection 60 mg    nalOXone (NARCAN) injection 0.1 mg    ondansetron (ZOFRAN) injection 4 mg    potassium chloride (KLOR-CON) ER tablet 40 mEq    Prostacyclin Cassette Change    Prostacyclin Ice Pack    Prostacyclin Tubing    Prostacylin Batteries    sildenafil (REVATIO, VIAGRA) tablet 80 mg    sodium chloride (PF) 0.9 % flush 3 mL    sodium chloride (PF) 0.9 % flush 3 mL    sodium chloride 0.9 % TKO infusion    spironolactone (ALDACTONE) tablet 50 mg    vitamin b-12 (CYANOCOBALAMIN) tablet 500 mcg     Review of Systems:  - Constitutional: denies fevers, chills and night sweats   - Cardiac: denies chest pain, palpitations; +edema, improving  - Respiratory: +dyspnea with exertion, improving; no dyspnea at rest; deniescoughing orhemoptysis  - GI: denies nausea, vomiting, abdominal pain  - Skin: denies rashes or ecchymoses   - Neuro: denies headaches, parasthesias or paresis   - MSK: denies myalgias or arthralgias     Objective  Temperature:  [97.4 F (36.3 C)-98.4 F (36.9 C)] 98.1 F (36.7 C) (06/01 0727)  Blood  pressure (BP): (125-141)/(58-74) 130/68 (06/01 0727)  Heart Rate:  [86-98] 92 (06/01 0727)  Respirations:  [16-21] 21 (06/01 0727)  Pain Score: 0 (06/01 0727)  O2 Device: Nasal cannula (06/01 0727)  O2 Flow Rate (L/min):  [3 l/min] 3 l/min (06/01 0727)  SpO2:  [94 %-95 %] 95 % (06/01 0727)  I/O: 1569/3925 = -2.3L/hour  UOP of 3925cc/24 hours  Wt: 74.8 kg (75.6kg, 5/31)    Physical Exam  General: sitting in the chair comfortably. Chronically ill appearing. Husband at bedside.   HEENT: NC/AT. PERRL, EOMI. Scleraanicteric. Moist oral mucosa.   Neck: + JVD  CV: RRR, loud S2  Lungs: Good effort. Bibasilar crackles. Moving air well.   Abdomen: Soft, mild distention, NT, + BS. No guarding or rebound  Extremities: 1-2+ LEedema. TED Hose on. Warm distal extremities.   Skin: maculopapular rash over LE's, stable   Neuro: CN II-XII grossly intact, strength grossly intact  Psych: flat affect     Labs  Recent Labs      06/11/16   0549  06/12/16   0605  06/13/16   0450   NA  138  137  142   K  4.0  3.2*  3.5   CL  96*  94*  96*   BICARB  28  30*  35*   BUN  99*  102*  104*   CREAT  1.45*  1.42*  1.44*   Bucks  10.7*  10.6  10.5   MG  2.6*  2.5*  2.4   PHOS  4.0  4.0  3.7       Recent Labs      06/11/16   0549  06/12/16   0605  06/13/16   0450   WBC  5.0  5.4  6.1   HGB  10.1*  10.2*  10.2*   HCT  32.4*  34.1  33.9*   MCV  81.2  81.4  81.5   PLT  94*  87*  80*   SEG  92  76  71   LYMPHS  5  18  21    MONOS  2  2  7        No results for input(s): PTT, INR in the last 72 hours.    No results for input(s): CPK, CKMB[facility], TROPONIN in the last 72 hours.    No results for input(s): LACTATE in the last 72 hours.    Pertinent Micro  - Ucx pending from 5/29 with > 100,000 CFU's of MSSA  - Ucx from 5/30 with > 100,000 CFU's of staph aureus  - Ucx from 5/31 pending (U/A 1+ LE, negative nitrite, 3+ blood, > 50 RBC's, 11/20 WBC's, moderate bacteria     Antibiotics  - CTX 1g IV faily (5/31 - )    Pertinent Imaging  - none new this am      - CT A/P non-contrast (5/26):  CT scan of the abdomen and pelvis without IV contrast  Sequelae of volume overload, likely secondary to right heart dysfunction.  No intra-abdominal mass is identified.  Cholelithiasis without evidence of acute cholecystitis.  Findings of pulmonary arterial hypertension are better evaluated on CT thorax from 05/04/2016.  Note that assessment of solid organs is limited in the absence of intravenous contrast.    - Abd U/S (05/29/2016):  Small amount of perihepatic ascites.  Cholelithiasis without evidence of acute cholecystitis.  Hepatosplenomegaly    - CT Chest w/o IVC (05/04/2016):  - Radiology Impression:  1. Severe enlargement of the pulmonary artery with increased calcification along the pulmonary arterial walls compatible with longstanding pulmonary hypertension. Progressed parenchymal changes of the lungs compatible with pulmonary hypertension.  2. Increased thin wall l cystic esions seen within the lungs, nonspecific be can be seen with underlying lymphocytic interstitial pneumonia, which can be seen in patients with lupus/Sjogren's disease.  3. Cardiomegaly with evidence of right heart dysfunction. Trace nonspecific pericardial fluid with pericardial thickening suspected. In a patient with known lupus underlying pericarditis/serositis may be present. Correlation with echocardiogram is recommended.  4. Likely reactive adenopathy of the thorax.  5. Additional findings as detailed above.  - Independently Reviewed:      Misc:  - TTE (06/10/2016):  1. The left ventricular size is normal and the left ventricular systolic function is normal.  2. Mild or grade I (impaired relaxation pattern) left ventricular diastolic filling.  3. The right ventricular size is severely enlarged and systolic function is reduced.  4. Moderate tricuspid regurgitation.  5. The aortic valve is mildly stenotic.  6. Severe pulmonary hypertension with right ventricular systolic pressure measuring  75 mmHg.  7. Compared to prior study no significant change.    - TTE (05/12/2016):  1. The left ventricular size is normal and the left ventricular systolic function is normal.  2.  The right ventricular size is moderately enlarged and systolic function is reduced.  3. Moderate tricuspid regurgitation.  4. Severe pulmonary hypertension with right ventricular systolic pressure measuring 70 mmHg.  5. Compared to prior study , PA pressures are slightly lower.    - RHC (05/27/2016):    - prior RHC done 02/2016 with PAP 97/35/58, PVR 584 D (7.30 WU), fCO/CI 6.85/3.9, tdCO/CI 6.17/3.51    ASSESSMENT AND PLAN  Diana Chambers is a 28 year [place] old female with SLE/Sjogren's and WHO Group I PAH who was admitted 5/15 after RHC was concerning for worsening hemodynamics. She has been on triple therapy including flolan although opsumit stopped. Refractory for diuresis for several days, improved after starting steroids and a dose of metolazone. Her problem list is as follows:     # WHO Group I PAH with RV Overload: underlying PAH in the setting of prior anorexigen use and CTD (Sjogrens). RHC 5/15 with PAP 92/37/62, PAOP 12, tdPVR 9.28 WU and normal CO/CI. Currently on flolan 35ng/kg/min. Remains volume overloaded; refractory to diuresis for several days although improving as of 5/30 with good UOP. Repeat TTE 5/29 unchanged   - continue diuresis with lasix 100IV q8h and oral spironolactone 50 mg daily   - no additional metolazone today   - continue PAHtreatment with PRA with flolan (currently at 35ng/kg/min) and PDE-I with sildenafil 80 TID; opsumit stopped(continue to hold)  -- note, since admission the flolan dose has been increased and opsumit is a new medication for her     # Acute on chronic hypoxemic respiratory failure: secondary to underlying PAH with likely destruction of cap-alveolar membrane and subsequent diffusion impairment (Fick's Law). 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 Stable on 3 LPM  - continue supplemental O2 as needed  - continue treatment of PAH, as above   - continue diuresis as outlined above   - continue steroids, started 5/28  - would benefit from sleep study     # ARF on CKD: baseline likely 1.1 - 1.2, currently 1.44 and stable. Likely cardiorenal with acute worsening due to poor response to diuresis. Active sediment with proteinuria and hematuria as well as + LE, query infection given > 100,000 staph (afebrile and normal WBC count) with repeat U/A concerning for infection after foley removal 5/31; etiology such as GN, AIN on ddx as well. 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 Less likely PLCH, LAM, BDH  - solumedrol started 5/28, continue for now   - outpatient follow up with rheumatology and pulmonary  - will need repeat imaging down the line     # Anemia/Thrombocytopenia:ongoing, although stable lines this am. Appears to be a chronic proces, ? Bone marrow suppression. No sign of infectious process, TSH 3.49, Fe 26, Transferritin 191, ferritin 47; RBC folate >1240B12 265(5/22)  - repeat CBC in the am   - continue oral Fe and B12  - may need bone marrow biopsy, defer for now (abnromal SPEP/UPEP although no monoclonal spike)    # Elevated ZD:GUYQIH  congestive hepatopathy. LFT's stable on last check   - continue treatment of R heart failure   - monitor LFT's    # Gout: last uric acid 8.6 (5/21), improved from 11.8 (4/24)  - continue uloric for now     # FEN:  - 2 gram Na+   - continue KCl 40 mEq daily     # PPx:  - DVT: LMWH  40 daily; monitor renal function  - Stress Ulcer: none   - Bowels: docusate   - Prn: APAP, zofran    # Code:  - FC/FT     # Dispo:  - ongoing level of care    # Misc:  - labs/imaging: ordered   - consults: nephrology (no longer following)  - invasion: central access via L sided tunneled SC line   - point of contact: [name] (husband) - [phone]; at bedside this am and updated on the plan of care     This patient was seen and discussed with Dr. [name].    Alfonse Ras, M.D.  Fellow  Department of Pulmonary and Critical Care Medicine  [phone]    Pulmonary Vascular Attending Addendum:  Patient was examined with Dr. Candis Schatz, imaging and tests reviewed.  Please see above note for full details and plan.    66 yo F with WHO I PAH associated with CTD admitted following RHC 5/15/18with acute on chronic right heart failure and volume overload. Also has background of CTD associated ILD. Chest imaging with possible LIP although has had minimal changes on CT scan over numerous years. Now withedema/anasarca that was poorly responsive to diuretics until macitentan discontinued and patient started on steroids.    -Continue day 5trial of solumedrol 60 mg IV daily to treat underlying inflammatory etiology for decompensation (LIP and ? Other organ involvement)  -Cr stable  -Continue IV lasix TID.  Anticipate transition to PO lasix on Monday.  -d/c'd  macitentan for unlikely but possible contribution to edema (lat dose 06/08/16)  -Anticipate checking BNPP     -Continue prior home digoxin 250 mcg PO QDAY   -Continue IV epo 35ng/kg/min  -Continue sildenafil 80 mg TID  -Continue uloric for gout  -LMWH for DVT prophylaxis  -Follow thrombocytopenia    [name] M.D.  Pulmonary and Critical Care Attending  Pager [phone]

## 2016-06-13 NOTE — Interdisciplinary (Signed)
06/13/16 1339   Follow Up/Progress   Is the Patient Ready for Discharge No   Barriers to Discharge Awaiting clinical improvement   Plan/Interventions Explore needs and options for aftercare, provide referrals     Per nursing rounds, pt unstable for d/c. She remains on Flolan gtt.  No change with treatment plan of care.    Barriers to d/c: clinical improvement. Transportation: family. DCP: TBD. Pt and care team aware and agree to further discuss d/c plan pending care team recommendations and clinical improvement. CM to continue to follow for d/c needs.    Ria Clock MSN/RN/CNL/PHN  Inpatient care manager  [phone]

## 2016-06-14 LAB — BASIC METABOLIC PANEL, BLOOD
Anion Gap: 12 mmol/L (ref 7–15)
Anion Gap: 13 mmol/L (ref 7–15)
BUN: 105 mg/dL — ABNORMAL HIGH (ref 8–23)
BUN: 99 mg/dL — ABNORMAL HIGH (ref 8–23)
Bicarbonate: 34 mmol/L — ABNORMAL HIGH (ref 22–29)
Bicarbonate: 32 mmol/L — ABNORMAL HIGH (ref 22–29)
Calcium: 10.7 mg/dL — ABNORMAL HIGH (ref 8.5–10.6)
Calcium: 10.7 mg/dL — ABNORMAL HIGH (ref 8.5–10.6)
Chloride: 95 mmol/L — ABNORMAL LOW (ref 98–107)
Chloride: 94 mmol/L — ABNORMAL LOW (ref 98–107)
Creatinine: 1.41 mg/dL — ABNORMAL HIGH (ref 0.51–0.95)
Creatinine: 1.21 mg/dL — ABNORMAL HIGH (ref 0.51–0.95)
GFR: 37 mL/min
GFR: 44 mL/min
Glucose: 85 mg/dL (ref 70–99)
Glucose: 194 mg/dL — ABNORMAL HIGH (ref 70–99)
Potassium: 3.6 mmol/L (ref 3.5–5.1)
Potassium: 4 mmol/L (ref 3.5–5.1)
Sodium: 141 mmol/L (ref 136–145)
Sodium: 139 mmol/L (ref 136–145)

## 2016-06-14 LAB — CBC WITH DIFF, BLOOD
ANC-Manual Mode: 5.8 10*3/uL (ref 1.6–7.0)
Abs Lymphs: 0.8 10*3/uL (ref 0.8–3.1)
Abs Monos: 0.1 10*3/uL — ABNORMAL LOW (ref 0.2–0.8)
Hct: 35.9 % (ref 34.0–45.0)
Hgb: 10.9 gm/dL — ABNORMAL LOW (ref 11.2–15.7)
Lymphocytes: 11 %
MCH: 24.7 pg — ABNORMAL LOW (ref 26.0–32.0)
MCHC: 30.4 g/dL — ABNORMAL LOW (ref 32.0–36.0)
MCV: 81.4 um3 (ref 79.0–95.0)
Monocytes: 2 %
Plt Count: 80 10*3/uL — ABNORMAL LOW (ref 140–370)
RBC: 4.41 10*6/uL (ref 3.90–5.20)
RDW: 18.2 % — ABNORMAL HIGH (ref 12.0–14.0)
Segs: 81 %
WBC: 7.1 10*3/uL (ref 4.0–10.0)

## 2016-06-14 LAB — MDIFF
Bands: 1 % (ref 0–15)
Immature Granulocytes Absolute Manual: 0.4 10*3/uL — ABNORMAL HIGH (ref 0.0–0.1)
Metamyelocytes: 2 %
Myelocytes: 3 %
Number of Cells Counted: 115
Plt Est: DECREASED

## 2016-06-14 LAB — URINE CULTURE

## 2016-06-14 LAB — MAGNESIUM, BLOOD: Magnesium: 2.3 mg/dL (ref 1.6–2.4)

## 2016-06-14 LAB — PTH INTACT, BLOOD: PTH Intact: 90 pg/mL — ABNORMAL HIGH (ref 15–65)

## 2016-06-14 LAB — PHOSPHORUS, BLOOD: Phosphorous: 3.6 mg/dL (ref 2.7–4.5)

## 2016-06-14 NOTE — Interdisciplinary (Signed)
Pt. Refuses to do sleep study for tonight. RN at bedside and aware.

## 2016-06-14 NOTE — Plan of Care (Addendum)
Problem: Fluid Volume Excess  Goal: Absence of edema; peripheral and/or pulmonary  Outcome: Progressing toward goal, anticipate improvement over: Next 24-48 hours  Pt. Starting to have more urine output.  On diuretics, strict I/O's taken.  Weight recorded.  Pt. On 1.5 L fluid restriction which she is compliant in keeping.  RN will continue to monitor patient closely.    Problem: Breathing Pattern - Ineffective  Goal: Respiratory rate, rhythm and depth return to baseline  Outcome: Progressing toward goal, anticipate improvement over: next 12-24 hours  Assess respiratory status Q4h and prn. Pt is currently on 3 L with oxygen saturation at or greater than 92%. Lung sounds are diminished.  Respiratory rate is even and non-labored.  Patient denies cough and shortness of breath at this time. No respiratory distress noted. Flolan infusing to left subclavian, please see dose and administration in MAR.  RN will continue to monitor and to update as needed.       Goal: Arterial blood gas values and/or saturation return to baseline  Outcome: Progressing toward goal, anticipate improvement over: next 12-24 hours      Problem: Tissue Perfusion, Cardiopulmonary - Altered  Goal: Hemodynamic stability  Outcome: Progressing toward goal, anticipate improvement over: Next 24-48 hours    Goal: Early recognition of deterioration  Outcome: Goal Met      Problem: Falls, Risk of  Goal: Keep patient free from falls utilizing universal fall precautions  Outcome: Goal Met

## 2016-06-14 NOTE — Progress Notes (Signed)
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 She feels better, walked 4 laps with husband yesterday. Feels less edematous and bloated.    Medications:  Scheduled Meds   cefTRIAXone (ROCEPHIN) IV  1,000 mg Q24H NR    digoxin  250 mcg QPM    docusate sodium  250 mg Daily    enoxaparin  40 mg Daily    febuxostat  40 mg Daily    ferrous sulfate  767 mg Daily    folic acid  0.5 mg Daily    furosemide (LASIX) IVPB (doses > 80 mg)  100 mg Q8H    medroxyPROGESTERone  1.25 mg Daily    methylPREDNISolone sodium succinate  60 mg Daily    potassium chloride  40 mEq Daily    Prostacyclin Cassette Change   Daily    Prostacyclin Ice Pack   Q6H    Prostacyclin Tubing   Once per day on Mon Wed Fri    Prostacylin Batteries   Once per day on Mon    sildenafil  80 mg TID    sodium chloride (PF)  3 mL Q8H    spironolactone  50 mg Daily    vitamin b-12  500 mcg Daily     PRN Meds   acetaminophen  650 mg Q4H PRN    nalOXone  0.1 mg Q2 Min PRN    ondansetron  4 mg Q6H PRN    sodium chloride (PF)  3 mL PRN    sodium chloride   Continuous PRN     IV Meds   epoprostenol (FLOLAN) infusion 35 ng/kg/min (06/13/16 2000)    sodium chloride         Objective:  Temperature:  [97.8 F (36.6 C)-98.1 F (36.7 C)] 97.9 F (36.6 C) (06/02 0754)  Blood pressure (BP): (127-140)/(58-73) 140/64 (06/02 1139)  Heart Rate:  [86-102] 91 (06/02 1139)  Respirations:  [20-21] 20 (06/02 1050)  Pain Score: 0 (06/02 1139)  O2 Device: Nasal cannula (06/02 1139)  O2 Flow Rate (L/min):  [3 l/min] 3 l/min (06/02 1139)  SpO2:  [93 %-96 %] 95 % (06/02 1139)      Intake/Output Summary (Last 24 hours) at 06/14/16 1147  Last data filed at 06/14/16 1139   Gross per 24 hour   Intake          1274.06 ml   Output             3150 ml   Net         -1875.94 ml     GEN: NAD, appears comfortable  CV: RRR, prominent  P2  RESP: CTAB, no wheezes/rales  ABD: soft, NT/ND, NABS  EXT: 1+ LE edema  NEURO: non-focal, grossly intact    CBC  Recent Labs      06/12/16   0605  06/13/16   0450  06/14/16   0536   WBC  5.4  6.1  7.1   HGB  10.2*  10.2*  10.9*   HCT  34.1  33.9*  35.9   PLT  87*  80*  80*   BAND  1   --   1   SEG  76  71  81   LYMPHS  18  21  11    MONOS  2  7  2       Chemistry  Recent Labs  06/13/16   0450  06/13/16   0530  06/13/16   1829  06/14/16   0536   NA  142   --   135*   --    K  3.5   --   3.8   --    CL  96*   --   91*   --    BICARB  35*   --   32*   --    BUN  104*   --   105*   --    CREAT  1.44*   --   1.32*   --    GLU  87   --   200*   --    [place]  10.5   --   10.8*   --    MG  2.4   --    --   2.3   PHOS  3.7   --    --   3.6   IONCA   --   1.40*   --    --      Assessment:   Diana Chambers is a 66 year old female with  SLE/Sjogren's and WHO group I pulmonary arterial hypertension on sildenafil and IV epoprostenol admitted for volume overload. [place] well with addition of prednisone.     Plan:  -Continue day 5trial of solumedrol 60 mg IV daily to treat underlying inflammatory etiology for decompensation (LIP and ? Other organ involvement); will plan on switching to prednisone for taper tomorrow  -Cr improving, labs pending this AM  -Continue IV lasix TID.  Anticipate transition to PO lasix on Monday.  -Anticipate checking BNPP     - Final dose of ceftriaxone for 3 day course for UTI today  -Continue prior home digoxin 250 mcg PO QDAY   -Continue IV epo 35ng/kg/min  -Continue sildenafil 80 mg TID  -Continue uloric for gout  -LMWH for DVT prophylaxis  -Follow thrombocytopenia  -Full Code/Full Care    Kris Hartmann. Rudi Rummage, M.D., M.P.H.  Pulmonary and Critical Care   Pager [phone]# [phone]

## 2016-06-14 NOTE — Plan of Care (Signed)
Problem: Fluid Volume Excess  Goal: Absence of edema; peripheral and/or pulmonary  Outcome: Unable to meet goal at this time.  Pt continues with 2-3+ edema to LE bilaterally and continues with IV lasix (see MAR). Pt with good UOP and (-) I/O daily (see charting on I/O's). Pt compliant with 1500 ml/day fluid restriction.    Problem: Breathing Pattern - Ineffective  Goal: Respiratory rate, rhythm and depth return to baseline  Outcome: Goal Met  Breathing is regular and non labored, chest expansion is symmetrical, breath sounds are clear, 02 sats remain > 92% on 3L 02 via NC. Will continue to monitor and assess.

## 2016-06-14 NOTE — Plan of Care (Signed)
Problem: Tissue Perfusion, Cardiopulmonary - Altered  Goal: Hemodynamic stability  Outcome: Goal Met  Pt A&O, VSS, NSR on telemetry, pulses palpable, remains on Flolan @ 72 ml/24 hrs.    Problem: Falls, Risk of  Goal: Keep patient free from falls utilizing universal fall precautions  Outcome: Goal Met  Pt is free of falls and injury and is a moderate fall risk. Bed in low position, wheels locked, bed alarm on, yellow anti skid socks and yellow charm utilized, call button within reach, and environmental safety check is performed with each hourly rounding. Will continue to monitor and assess.

## 2016-06-15 LAB — BASIC METABOLIC PANEL, BLOOD
Anion Gap: 14 mmol/L (ref 7–15)
Anion Gap: 10 mmol/L (ref 7–15)
BUN: 96 mg/dL — ABNORMAL HIGH (ref 8–23)
BUN: 88 mg/dL — ABNORMAL HIGH (ref 8–23)
Bicarbonate: 33 mmol/L — ABNORMAL HIGH (ref 22–29)
Bicarbonate: 34 mmol/L — ABNORMAL HIGH (ref 22–29)
Calcium: 10.3 mg/dL (ref 8.5–10.6)
Calcium: 10.5 mg/dL (ref 8.5–10.6)
Chloride: 97 mmol/L — ABNORMAL LOW (ref 98–107)
Chloride: 95 mmol/L — ABNORMAL LOW (ref 98–107)
Creatinine: 1.19 mg/dL — ABNORMAL HIGH (ref 0.51–0.95)
Creatinine: 1.1 mg/dL — ABNORMAL HIGH (ref 0.51–0.95)
GFR: 45 mL/min
GFR: 50 mL/min
Glucose: 92 mg/dL (ref 70–99)
Glucose: 243 mg/dL — ABNORMAL HIGH (ref 70–99)
Potassium: 3.3 mmol/L — ABNORMAL LOW (ref 3.5–5.1)
Potassium: 3.9 mmol/L (ref 3.5–5.1)
Sodium: 144 mmol/L (ref 136–145)
Sodium: 139 mmol/L (ref 136–145)

## 2016-06-15 LAB — CBC WITH DIFF, BLOOD
ANC-Manual Mode: 5.9 10*3/uL (ref 1.6–7.0)
Abs Lymphs: 1.2 10*3/uL (ref 0.8–3.1)
Abs Monos: 0.1 10*3/uL — ABNORMAL LOW (ref 0.2–0.8)
Hct: 36.3 % (ref 34.0–45.0)
Hgb: 10.7 gm/dL — ABNORMAL LOW (ref 11.2–15.7)
Lymphocytes: 17 %
MCH: 24.2 pg — ABNORMAL LOW (ref 26.0–32.0)
MCHC: 29.5 g/dL — ABNORMAL LOW (ref 32.0–36.0)
MCV: 82.1 um3 (ref 79.0–95.0)
Monocytes: 1 %
Plt Count: 72 10*3/uL — ABNORMAL LOW (ref 140–370)
RBC: 4.42 10*6/uL (ref 3.90–5.20)
RDW: 17.8 % — ABNORMAL HIGH (ref 12.0–14.0)
Segs: 79 %
WBC: 7.3 10*3/uL (ref 4.0–10.0)

## 2016-06-15 LAB — MDIFF
Bands: 2 % (ref 0–15)
Immature Granulocytes Absolute Manual: 0.1 10*3/uL (ref 0.0–0.1)
Myelocytes: 1 %
Number of Cells Counted: 115
Plt Est: DECREASED

## 2016-06-15 LAB — PHOSPHORUS, BLOOD: Phosphorous: 3.7 mg/dL (ref 2.7–4.5)

## 2016-06-15 LAB — MAGNESIUM, BLOOD: Magnesium: 2.4 mg/dL (ref 1.6–2.4)

## 2016-06-15 MED ORDER — PREDNISONE 20 MG OR TABS
40.0000 mg | ORAL_TABLET | Freq: Every day | ORAL | Status: DC
Start: 2016-06-16 — End: 2016-06-21
  Administered 2016-06-16 – 2016-06-21 (×6): 40 mg via ORAL
  Filled 2016-06-15 (×6): qty 2

## 2016-06-15 NOTE — Plan of Care (Signed)
Problem: Fluid Volume Excess  Goal: Absence of edema; peripheral and/or pulmonary  Outcome: Progressing toward goal, anticipate improvement over: Next 24-48 hours  Patient receiving q6h lasix iv gtt. Will return to lasix po this week. 1+ edema bilat ankles/feet. Continue daily weight.i/o monitoring. Fluid restriction.     Problem: Breathing Pattern - Ineffective  Goal: Respiratory rate, rhythm and depth return to baseline  Outcome: Progressing toward goal, anticipate improvement over: >48 hours  Pt with pulmonary hypertension. Pt is sob with exertion requires rest w/ activity at times. Pt satting 93-95% on 3L NC oxygen.     Problem: Tissue Perfusion, Cardiopulmonary - Altered  Goal: Hemodynamic stability  Outcome: Progressing toward goal, anticipate improvement over: next 12-24 hours  Patient is on cardiac monitoring, SR 80s. csm intact. Cont to monitor.     Problem: Falls, Risk of  Goal: Keep patient free from falls utilizing universal fall precautions  Outcome: Goal Met  Pt has not fallen this evening. sr up x2 call bell in reach. Bed in lowest position, brakes on. Cont hourly rounds for safety.

## 2016-06-15 NOTE — Plan of Care (Signed)
Problem: Fluid Volume Excess  Goal: Absence of edema; peripheral and/or pulmonary  Outcome: Progressing toward goal, anticipate improvement over: Next 24-48 hours  Although pt feels her bilateral lower legs edema is getting smaller, pt still has +3 edema in her lower legs. Pt is on lasix 100mg  IV Q8H and producing urine very well. Continue to monitor.

## 2016-06-15 NOTE — Interdisciplinary (Signed)
Report received from off-going RN. Patient up in chair. No acute distress. Husband in room. L/s diminished bases bilat. LE edema 1+ bilat. Teds stockings on. Flolan gtt rate reviewed.

## 2016-06-15 NOTE — Plan of Care (Signed)
Problem: Fluid Volume Excess  Goal: Absence of edema; peripheral and/or pulmonary  Outcome: Goal Met  Patient with LE edema 2+  Feet/ankles. csm intact. Cont I/o monitoring 1.5L fluid restriction. Daily weights. Lasix per schedule.     Problem: Breathing Pattern - Ineffective  Goal: Respiratory rate, rhythm and depth return to baseline  Outcome: Progressing toward goal, anticipate improvement over: Next 24-48 hours  Pt with sob with exertion. L/s clear satting 90s.     Problem: Tissue Perfusion, Cardiopulmonary - Altered  Goal: Hemodynamic stability  Outcome: Goal Met  Patient on cardiac monitoring, SR. Vss. Cont to monitor.     Problem: Falls, Risk of  Goal: Keep patient free from falls utilizing universal fall precautions  Outcome: Goal Met  Pt calls for assist to get oob. Gait steady. No falls this shift. Bed in lowest position,

## 2016-06-15 NOTE — Progress Notes (Signed)
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 She continues to feel better. She walked in halls this AM with her husband. Feels edema is improving. 3L negative yesterday.    Medications:  Scheduled Meds   digoxin  250 mcg QPM    docusate sodium  250 mg Daily    enoxaparin  40 mg Daily    febuxostat  40 mg Daily    ferrous sulfate  725 mg Daily    folic acid  0.5 mg Daily    furosemide (LASIX) IVPB (doses > 80 mg)  100 mg Q8H    medroxyPROGESTERone  1.25 mg Daily    potassium chloride  40 mEq Daily    [START ON 06/16/2016] predniSONE  40 mg Daily    Prostacyclin Cassette Change   Daily    Prostacyclin Ice Pack   Q6H    Prostacyclin Tubing   Once per day on Mon Wed Fri    Prostacylin Batteries   Once per day on Mon    sildenafil  80 mg TID    sodium chloride (PF)  3 mL Q8H    spironolactone  50 mg Daily    vitamin b-12  500 mcg Daily     PRN Meds   acetaminophen  650 mg Q4H PRN    nalOXone  0.1 mg Q2 Min PRN    ondansetron  4 mg Q6H PRN    sodium chloride (PF)  3 mL PRN    sodium chloride   Continuous PRN     IV Meds   epoprostenol (FLOLAN) infusion 35 ng/kg/min (06/15/16 0719)    sodium chloride         Objective:  Temperature:  [97.4 F (36.3 C)-98.4 F (36.9 C)] 97.7 F (36.5 C) (06/03 1151)  Blood pressure (BP): (118-139)/(51-70) 136/51 (06/03 1151)  Heart Rate:  [88-96] 92 (06/03 1151)  Respirations:  [16-18] 18 (06/03 1151)  Pain Score: 0 (06/03 1151)  O2 Device: Nasal cannula (06/03 1151)  O2 Flow Rate (L/min):  [3 l/min] 3 l/min (06/03 1151)  SpO2:  [93 %-96 %] 94 % (06/03 1151)    Intake/Output Summary (Last 24 hours) at 06/15/16 1322  Last data filed at 06/15/16 1153   Gross per 24 hour   Intake          1218.89 ml   Output             4200 ml   Net         -2981.11 ml      GEN: NAD, appears comfortable  CV: RRR, prominent P2  RESP: CTAB, no  wheezes/rales  ABD: soft, NT/ND, NABS  EXT: 1+ LE edema  NEURO: non-focal, grossly intact    CBC  Recent Labs      06/14/16   0536  06/15/16   0547   WBC  7.1  7.3   HGB  10.9*  10.7*   HCT  35.9  36.3   PLT  80*  72*   BAND  1  2   SEG  81  79   LYMPHS  11  17   MONOS  2  1      Chemistry  Recent Labs      06/13/16   0530   06/14/16   0536  06/14/16   1727  06/15/16   0547   NA   --    < >  141  139  144   K   --    < >  3.6  4.0  3.3*   CL   --    < >  95*  94*  97*   BICARB   --    < >  34*  32*  33*   BUN   --    < >  105*  99*  96*   CREAT   --    < >  1.41*  1.21*  1.19*   GLU   --    < >  85  194*  92   [place]   --    < >  10.7*  10.7*  10.3   MG   --    --   2.3   --   2.4   PHOS   --    --   3.6   --   3.7   IONCA  1.40*   --    --    --    --     < > = values in this interval not displayed.     Assessment:   Diana Chambers is a 66 year old female with  SLE/Sjogren's and WHO group I pulmonary arterial hypertension on sildenafil and IV epoprostenol admitted for volume overload. [place] well with addition of prednisone.     Plan:  - Switching IN methylprednisolone to prednisone for tapering  - Cr improving  - Continue IV lasix TID.  Anticipate transition to PO lasix on Monday.  - Anticipate checking BNPP tomorrow AM  - Stop antibiotics for UTI  - Continue prior home digoxin 250 mcg PO QDAY   - Continue IV epo 35ng/kg/min  - Continue sildenafil 80 mg TID  - Continue uloric for gout  - LMWH for DVT prophylaxis  - Follow thrombocytopenia  - Full Code/Full Care    Kris Hartmann. Rudi Rummage, M.D., M.P.H.  Pulmonary and Critical Care   Pager [phone]# [phone]

## 2016-06-16 LAB — BASIC METABOLIC PANEL, BLOOD
Anion Gap: 12 mmol/L (ref 7–15)
Anion Gap: 9 mmol/L (ref 7–15)
BUN: 81 mg/dL — ABNORMAL HIGH (ref 8–23)
BUN: 79 mg/dL — ABNORMAL HIGH (ref 8–23)
Bicarbonate: 35 mmol/L — ABNORMAL HIGH (ref 22–29)
Bicarbonate: 34 mmol/L — ABNORMAL HIGH (ref 22–29)
Calcium: 10.4 mg/dL (ref 8.5–10.6)
Calcium: 10.4 mg/dL (ref 8.5–10.6)
Chloride: 98 mmol/L (ref 98–107)
Chloride: 98 mmol/L (ref 98–107)
Creatinine: 1.22 mg/dL — ABNORMAL HIGH (ref 0.51–0.95)
Creatinine: 1.02 mg/dL — ABNORMAL HIGH (ref 0.51–0.95)
GFR: 44 mL/min
GFR: 54 mL/min
Glucose: 110 mg/dL — ABNORMAL HIGH (ref 70–99)
Glucose: 157 mg/dL — ABNORMAL HIGH (ref 70–99)
Potassium: 3.7 mmol/L (ref 3.5–5.1)
Potassium: 4.1 mmol/L (ref 3.5–5.1)
Sodium: 145 mmol/L (ref 136–145)
Sodium: 141 mmol/L (ref 136–145)

## 2016-06-16 LAB — PRO BNP, BLOOD: BNPP: 9414 pg/mL — ABNORMAL HIGH (ref 0–899)

## 2016-06-16 LAB — MAGNESIUM, BLOOD: Magnesium: 2.3 mg/dL (ref 1.6–2.4)

## 2016-06-16 NOTE — Plan of Care (Signed)
Problem: Fluid Volume Excess  Goal: Absence of edema; peripheral and/or pulmonary  Outcome: Progressing toward goal, anticipate improvement over: Next 24-48 hours  Pt. On diuretics, states that she feels better and is noticing fluid decreasing.  Bilateral lower extremity edema noted.  RN will continue to monitor patient closely.    Problem: Breathing Pattern - Ineffective  Goal: Respiratory rate, rhythm and depth return to baseline  Outcome: Progressing toward goal, anticipate improvement over: next 12-24 hours  Assess respiratory status Q4h and prn. Pt is currently on 3 L with oxygen saturation greater than 92%. Lung sounds are clear with diminished bases. Respiratory rate is even and non-labored.  Patient denies cough and shortness of breath at this time. No respiratory distress noted. Will continue to monitor and to update as needed.     Goal: Arterial blood gas values and/or saturation return to baseline  Outcome: Goal Met  Pt. States that she is on 3 L at home.    Problem: Tissue Perfusion, Cardiopulmonary - Altered  Goal: Hemodynamic stability  Outcome: Goal Met  Assess per unit policy standard and procedures. Pt reminded to notify RN with chest pain, pressure, and palpitations.Patient is on continuous telemetry monitoring.   SR   Vitals:    06/16/16 0733 06/16/16 0822 06/16/16 1135 06/16/16 1630   BP:  135/57 116/57 124/58   BP Location:  Left arm Left arm Right arm   BP Patient Position:  Sitting Sitting Semi-Fowlers   Pulse:  86 92 86   Resp:  18 18 18    Temp:  97.7 F (36.5 C) 97.6 F (36.4 C) 98.5 F (36.9 C)   SpO2: 96% 95% 98% 93%   Weight:       Height:       Bilateral lower extremity edema noted.  All pulses palpable. Skin is warm and dry. Patient denies chest pain, pressure, and palpitations.   AM Labs:   Lab Results   Component Value Date    NA 145 06/16/2016    K 3.7 06/16/2016    CL 98 06/16/2016    BICARB 35 (H) 06/16/2016    BUN 81 (H) 06/16/2016    CREAT 1.22 (H) 06/16/2016    GLU 110 (H) 06/16/2016    [place] 10.4 06/16/2016   RN will continue to monitor and to update as needed.      Problem: Falls, Risk of  Goal: Keep patient free from falls utilizing universal fall precautions  Outcome: Goal Met

## 2016-06-16 NOTE — Progress Notes (Signed)
PULMONARY/CRITICAL CARE ATTENDING NOTE  Current Hospital 20 Days 12 Hours      Subjective:   SOB improving.  Edema imporving.    Medications:  Scheduled Meds   digoxin  250 mcg QPM    docusate sodium  250 mg Daily    enoxaparin  40 mg Daily    febuxostat  40 mg Daily    ferrous sulfate  578 mg Daily    folic acid  0.5 mg Daily    furosemide (LASIX) IVPB (doses > 80 mg)  100 mg Q8H    medroxyPROGESTERone  1.25 mg Daily    potassium chloride  40 mEq Daily    predniSONE  40 mg Daily    Prostacyclin Cassette Change   Daily    Prostacyclin Ice Pack   Q6H    Prostacyclin Tubing   Once per day on Mon Wed Fri    Prostacylin Batteries   Once per day on Mon    sildenafil  80 mg TID    sodium chloride (PF)  3 mL Q8H    spironolactone  50 mg Daily    vitamin b-12  500 mcg Daily     PRN Meds   acetaminophen  650 mg Q4H PRN    nalOXone  0.1 mg Q2 Min PRN    ondansetron  4 mg Q6H PRN    sodium chloride (PF)  3 mL PRN    sodium chloride   Continuous PRN     IV Meds   epoprostenol (FLOLAN) infusion 35 ng/kg/min (06/16/16 1400)    sodium chloride         Vitals:    Latest Entry  Range (last 24 hours)    Temperature: 98.5 F (36.9 C)  Temp  Avg: 98 F (36.7 C)  Min: 97.6 F (36.4 C)  Max: 98.5 F (36.9 C)    Blood pressure (BP): 124/58  BP  Min: 116/57  Max: 137/65    Heart Rate: 86  Pulse  Avg: 88.1  Min: 86  Max: 92    Respirations: 18  Resp  Avg: 17.6  Min: 16  Max: 18    SpO2: 93 %  SpO2  Avg: 95.9 %  Min: 93 %  Max: 98 %       No Data Recorded     Weight: 73.5 kg (162 lb 0.6 oz)  Percentage Weight Change (%): 0.69 %         Intake/Output Summary (Last 24 hours) at 06/16/16 1951  Last data filed at 06/16/16 1745   Gross per 24 hour   Intake          1114.88 ml   Output             3576 ml   Net         -2461.12 ml       Exam:   Gen: NAD, comfortable in bed  NECK: + JVd  CV: RRR, + TR murmur  RESP: rales at baes bilat  ABD: mild/moderate distention  EXT: trace to 1+ edema LE bilat  NEURO: non-focal,  grossly intact    Labs:   CBC  Recent Labs      06/14/16   0536  06/15/16   0547   WBC  7.1  7.3   HGB  10.9*  10.7*   HCT  35.9  36.3   PLT  80*  72*   BAND  1  2   SEG  81  79  LYMPHS  11  17   MONOS  2  1      Chemistry  Recent Labs      06/14/16   0536   06/15/16   0547   06/16/16   0551  06/16/16   1829   NA  141   < >  144   < >  145  141   K  3.6   < >  3.3*   < >  3.7  4.1   CL  95*   < >  97*   < >  98  98   BICARB  34*   < >  33*   < >  35*  34*   BUN  105*   < >  96*   < >  81*  79*   CREAT  1.41*   < >  1.19*   < >  1.22*  1.02*   GLU  85   < >  92   < >  110*  157*   [place]  10.7*   < >  10.3   < >  10.4  10.4   MG  2.3   --   2.4   --   2.3   --    PHOS  3.6   --   3.7   --    --    --     < > = values in this interval not displayed.       Assessment and Plan:  66 yo F with WHO I PAH associated with CTD admitted following RHC 5/15/18with acute on chronic right heart failure and volume overload. Also has background of CTD associated ILD. Chest imaging with possible LIP although has had minimal changes on CT scan over numerous years. Now withedema/anasarca that was poorly responsive to diuretics until macitentan discontinued and patient started on steroids.    -Continue prednisone 40 mg daily to treat underlying inflammatory etiology for decompensation (LIP and ? Other organ involvement)    -Repeat DS DNA tomorrow and C3 C4  -Cr stable  -Continue IV lasix TID  -Transtion to PO lasix tomorrow.   -Continue prior home digoxin 250 mcg PO QDAY   -Continue IV epo 35ng/kg/min  -Continue sildenafil 80 mg TID  -Continue uloric for gout  -LMWH for DVT prophylaxis  -Follow thrombocytopenia    [name] M.D.  Pulmonary and Critical Care Attending  Pager [phone]

## 2016-06-17 DIAGNOSIS — M329 Systemic lupus erythematosus, unspecified: Secondary | ICD-10-CM

## 2016-06-17 DIAGNOSIS — M2540 Effusion, unspecified joint: Secondary | ICD-10-CM

## 2016-06-17 LAB — BASIC METABOLIC PANEL, BLOOD
Anion Gap: 13 mmol/L (ref 7–15)
BUN: 80 mg/dL — ABNORMAL HIGH (ref 8–23)
Bicarbonate: 30 mmol/L — ABNORMAL HIGH (ref 22–29)
Calcium: 10.2 mg/dL (ref 8.5–10.6)
Chloride: 98 mmol/L (ref 98–107)
Creatinine: 1.07 mg/dL — ABNORMAL HIGH (ref 0.51–0.95)
GFR: 51 mL/min
Glucose: 93 mg/dL (ref 70–99)
Potassium: 4.4 mmol/L (ref 3.5–5.1)
Sodium: 141 mmol/L (ref 136–145)

## 2016-06-17 MED ORDER — CAPHOSOL MT SOLN
5.0000 mL | OROMUCOSAL | Status: DC | PRN
Start: 2016-06-17 — End: 2016-06-21
  Administered 2016-06-19 – 2016-06-20 (×2): 5 mL via ORAL
  Filled 2016-06-17 (×2): qty 5

## 2016-06-17 MED ORDER — FUROSEMIDE 10 MG/ML IJ SOLN
100.00 mg | Freq: Once | INTRAMUSCULAR | Status: AC
Start: 2016-06-17 — End: 2016-06-17
  Administered 2016-06-17: 100 mg via INTRAVENOUS
  Filled 2016-06-17: qty 10

## 2016-06-17 MED ORDER — FUROSEMIDE 40 MG OR TABS
80.0000 mg | ORAL_TABLET | Freq: Three times a day (TID) | ORAL | Status: DC
Start: 2016-06-17 — End: 2016-06-18
  Administered 2016-06-17 – 2016-06-18 (×4): 80 mg via ORAL
  Filled 2016-06-17 (×4): qty 2

## 2016-06-17 MED ORDER — FUROSEMIDE 10 MG/ML IJ SOLN
100.0000 mg | Freq: Once | INTRAMUSCULAR | Status: DC
Start: 2016-06-17 — End: 2016-06-17

## 2016-06-17 NOTE — Interdisciplinary (Signed)
06/17/16 0751   Follow Up/Progress   Is the Patient Ready for Discharge No   Barriers to Discharge Awaiting clinical improvement   Plan/Interventions Explore needs and options for aftercare, provide referrals     Per nursing rounds, pt unstable for d/c. She remains on 3L O2 NC and Flolan gtt. No change with treatment plan of care.    Barriers to d/c: clinical improvement. Transportation: family. DCP: TBD. Pt and care team aware and agree to further discuss d/c plan pending care team recommendations and clinical improvement. CM to continue to follow for d/c needs.    Ria Clock MSN/RN/CNL/PHN  Inpatient care manager  [phone]

## 2016-06-17 NOTE — Plan of Care (Signed)
Problem: Fluid Volume Excess  Goal: Absence of edema; peripheral and/or pulmonary  Outcome: Progressing toward goal, anticipate improvement over: Next 24-48 hours  Patient with +1 pitting edema to BLE, on lasix IV, legs elevated while in bed, will continue to monitor    Problem: Breathing Pattern - Ineffective  Goal: Respiratory rate, rhythm and depth return to baseline  Outcome: Progressing toward goal, anticipate improvement over: Next 24-48 hours  Patient's O2 sat stable on 3L NC, lung sounds clear/diminished, free of SOB    Problem: Tissue Perfusion, Cardiopulmonary - Altered  Goal: Hemodynamic stability  Outcome: Goal Met  VS stable, NSR on telemetry, free of chest pain/SOB, on lasix IVPB for diuresis  Goal: Early recognition of deterioration  Outcome: Goal Met      Problem: Falls, Risk of  Goal: Keep patient free from falls utilizing universal fall precautions  Outcome: Goal Met  Patient free of falls, patient utilizes call light appropriately before getting out of bed.

## 2016-06-17 NOTE — Plan of Care (Signed)
Problem: Fluid Volume Excess  Goal: Absence of edema; peripheral and/or pulmonary  Outcome: Progressing toward goal, anticipate improvement over: >48 hours  Peripheral edema noted & crackles on auscultation.  Po diuretics administered with good urine output.     Problem: Breathing Pattern - Ineffective  Goal: Respiratory rate, rhythm and depth return to baseline  Outcome: Goal Met      Problem: Tissue Perfusion, Cardiopulmonary - Altered  Goal: Hemodynamic stability  Outcome: Goal Met      Problem: Falls, Risk of  Goal: Keep patient free from falls utilizing universal fall precautions  Outcome: Goal Met  Ambulatory with stable gait.

## 2016-06-17 NOTE — Progress Notes (Signed)
PULMONARY VASCULAR PROGRESS NOTE     06/17/16     Current Hospital Stay:   21 days - Admitted on: 05/27/2016    Subjective:  Feeling overall better.  Ambulated four times around the nursing station twice yesterday and did not have to stop at all to catch her breath.  There has been no lightheadedness, dizziness, nausea.  She has not had any chest pain or tightness. Does note that her mouth is dry and has a bit of a globus sensation in her throat.  She will sometimes have dry mouth at home and will take some ACT lozenges or use biotene toothpaste for this reason.     Objective:  - Lasix transitioned to 80mg  PO TID    Current Medications:   digoxin  250 mcg QPM    docusate sodium  250 mg Daily    enoxaparin  40 mg Daily    febuxostat  40 mg Daily    ferrous sulfate  287 mg Daily    folic acid  0.5 mg Daily    furosemide  80 mg TID    medroxyPROGESTERone  1.25 mg Daily    potassium chloride  40 mEq Daily    predniSONE  40 mg Daily    Prostacyclin Cassette Change   Daily    Prostacyclin Ice Pack   Q6H    Prostacyclin Tubing   Once per day on Mon Wed Fri    Prostacylin Batteries   Once per day on Mon    sildenafil  80 mg TID    sodium chloride (PF)  3 mL Q8H    spironolactone  50 mg Daily    vitamin b-12  500 mcg Daily      epoprostenol (FLOLAN) infusion 35 ng/kg/min (06/16/16 1400)    sodium chloride        acetaminophen  650 mg Q4H PRN    nalOXone  0.1 mg Q2 Min PRN    ondansetron  4 mg Q6H PRN    sodium chloride (PF)  3 mL PRN    sodium chloride   Continuous PRN       Review of Systems:   Nutrition: Diet Custom: 2 Gram Sodium     Vital Signs:  Temperature:  [97.6 F (36.4 C)-98.7 F (37.1 C)] 98.7 F (37.1 C) (06/05 0822)  Blood pressure (BP): (116-133)/(57-80) 125/60 (06/05 0822)  Heart Rate:  [86-99] 91 (06/05 0822)  Respirations:  [18] 18 (06/05 0822)  Pain Score: 0 (06/05 0822)  O2 Device: Nasal cannula (06/05 0822)  O2 Flow Rate (L/min):  [3 l/min] 3 l/min (06/05 0822)  SpO2:  [93 %-98  %] 93 % (06/05 0822)  RASS Score: Alert and calm    Wt Readings from Last 1 Encounters:   06/17/16 73.6 kg (162 lb 4.1 oz)     Intake/Output (Current Shift):  06/04 0600 - 06/05 0559  In: 958.8 [P.O.:820; I.V.:138.8]  Out: 3151 [Urine:3150]    Physical Exam:  General:  NAD, sitting in chair at bedside with legs elevated  HEENT:  Mucus membranes dry  Neck:  JVP ~10cm  Lungs:  Bibasilar crackles, otherwise clear to auscultation  Chest: tunneled line in left chest, c/d/i  CV:  RRR, III/VI systolic murmur best heard at left sternal border, no r/g  Abdomen:  Soft, nontender, mildly distended  GU:  No foley  Extremities:  Trace pitting edema, warm  Skin: diffuse flushing throughout, petechial rash bilateral medial thighs  Neuro:  Alert, oriented  Diagnostic Data:  Laboratory data:   Lab Results   Component Value Date    K 4.4 06/17/2016    CL 98 06/17/2016    BICARB 30 (H) 06/17/2016    BUN 80 (H) 06/17/2016    CREAT 1.07 (H) 06/17/2016    GLU 93 06/17/2016     10.2 06/17/2016     Assessment and Plan:  Diana Chambers is a 66 year [place] old female with SLE/Sjogren's and WHO Group I PAH who was admitted 5/15 after RHC was concerning for worsening hemodynamics. She has been on triple therapy including flolan although opsumit stopped. Now doing well with steroids and diuresis.       # WHO Group I PAH with RV Overload:  RHC 5/15 with PAP 92/37/62, PAOP 12, tdPVR 9.28 WU and normal CO/CI. Currently on flolan 35ng/kg/min.    - transition to Lasix 80mg  PO TID today, will need to monitor for worsening overload  - continue Aldactone 50mg  daily  - continue PAHtreatment with PRA with flolan (currently at 35ng/kg/min) and PDE-I with sildenafil 80 TID; opsumit stopped(continue to hold)  - follow up rheumatologic studies (repeat DsDNA, C3, C4)  - continue prednisone 40mg  BID    # Acute on chronic hypoxemic respiratory failure:  Now improved, stable on 3LPM  - continue supplemental O2   - continue treatment of PAH, as  above   - continue diuresis as outlined above   - continue steroids, started 5/28  - has declined sleep study    # ARF on CKD: Renal function now best it's been all admission.  - plan for CTX x3 days from 5/31  - continue treatment of PAH as above  - continue diuresis  - monitor sCr and UOP  - avoid nephrotoxins and renally dose medications     # Anemia/Thrombocytopenia:Overall stable. No sign of infectious process, TSH 3.49, Fe 26, Transferritin 191, ferritin 47; RBC folate >1240B12 265(5/22)  - continue oral Fe and B12    # Gout: last uric acid 8.6 (5/21), improved from 11.8 (4/24)  - continue uloric for now     # PPx:  - DVT: LMWH 40 daily; monitor renal function  - Stress Ulcer: none   - Bowels: docusate   - Prn: APAP, zofran    # Code:  - FC/FT      Code Status      Full Code - Call Code    The patient was discussed with attending Dr. [name].    Collene Schlichter MD  PCCM fellow  [phone]    Note Author: Collene Schlichter, MD  06/17/16, 8:46 AM    Pulmonary Vascular Attending Addendum:  Patient was examined with Dr. Kalman Shan MD  Labs, imaging and tests reviewed.  Please see above note for full details and plan.    66 yo F with WHO I PAH associated with CTD admitted following RHC 5/15/18with acute on chronic right heart failure and volume overload. Also has background of CTD associated ILD. Chest imaging with possible LIP although has had minimal changes on CT scan over numerous years. Now withedema/anasarca that was poorly responsive to diuretics until macitentan discontinued and patient started on steroids.     -Continue prednisone 40 mg daily to treat underlying inflammatory etiology for decompensation (LIP and ? Other organ involvement)  -Repeat DS DNA pending.  C3 C4 pending  -Cr further improved  -Transition to PO lasix  -Continue prior home digoxin 250 mcg PO QDAY   -Continue IV epo 35ng/kg/min  -  Continue sildenafil 80 mg TID  -Continue uloric for gout  -Follow thrombocytopenia    [name]  M.D.  Pulmonary and Critical Care Attending  Pager [phone]

## 2016-06-17 NOTE — Consults (Signed)
Rheumatology Consult Note    Requesting provider: Dr. Poch  Date of admission: 05/27/16  Date of initial consult: 06/17/16    HPI:   Diana Chambers is a 66 year old female with a history of SLE, Sjogren syndrome, LIP, PAH, and migratory joint swelling who was admitted 05/27/16 for RHC and fluid overlap. Rheumatology consulted for management of SLE and contribution of SLE to PAH.     Several years ago she had an instance of eye irritation and pain while traveling, saw an ophthalmologist who did tests and discovered she had +SSA and referred her to a rheumatologist. She was diagnosed with SLE on a basis of + ANA 1:640, SSA, SSB, dsDNA > 200, and was being followed by Dr. Bluestein until 2015. She was told that despite having elevated antibody titers, her SLE was clinically quiescent and she was never put on medical therapy. She also has pulmonary HTN, diagnosed 17 years ago before she receiving a diagnosis of SLE. CT showed lymphocytic interstitial pneumonitis (LIP). She has been on Flolan IV for PAH, and has received intermittent prednisone bursts. She has been having progressive SOB and peripheral edema, and was admitted 05/27/16 for RHC. During this admission, she has received diuresis and diagnosed with MSSA UTI treated with ceftriaxone x 3 days. She was also given prednisone 40 mg bid starting 06/16/16, and reports that she had difficulty urinating until she was given prednisone and a Foley catheter was inserted (immediately had 350 cc urine output). She feels that she is retaining water less now. At this time, she reports SOB, dry mouth, and dry eyes. She has facial flushing while on Flolan. Denies chest pain, joint pain, oral ulcers, rashes, alopecia, Raynaud's phenomenon, muscle weakness, abdominal pain, N/V/C/D, and photosensitivity.       ROS:   Denies fevers, chills, weight loss, oral ulcers, alopecia, skin rash or thickening, Raynaud's phenomenon, dysphagia, reflux symptoms, diarrhea, abdominal pain,  nausea, or vomiting, hematochezia, hematuria, or hemoptysis.     14-pt ROS reviewed and otherwise negative.    PMH:  SLE  Sjogren Syndrome  CKD  PAH  Gout?    Past Medical History:   Diagnosis Date   • Pulmonary HTN    • Sjoegren syndrome (CMS-HCC) 07/12/2009       Past Surgical Hx:  No past surgical history on file.    Current Meds:    Current Facility-Administered Medications:   •  acetaminophen (TYLENOL) tablet 650 mg, 650 mg, Oral, Q4H PRN, Light, Matthew Perry, MD, 650 mg at 06/04/16 0339  •  digoxin (LANOXIN) tablet 250 mcg, 250 mcg, Oral, QPM, Poch, David S., MD, 250 mcg at 06/16/16 1814  •  docusate sodium (COLACE) capsule 250 mg, 250 mg, Oral, Daily, Poch, David S., MD, 250 mg at 06/17/16 0902  •  enoxaparin (LOVENOX) injection 40 mg, 40 mg, Subcutaneous, Daily, Poch, David S., MD, 40 mg at 06/17/16 0902  •  epoprostenol (FLOLAN) 60,000 ng/mL in glycine diluent 100 mL infusion, 35 ng/kg/min (Order-Specific), IntraVENOUS, Continuous, Poch, David S., MD, Last Rate: 3.01 mL/hr at 06/17/16 1424, 35 ng/kg/min at 06/17/16 1424  •  febuxostat (ULORIC) tablet 40 mg, 40 mg, Oral, Daily, Light, Matthew Perry, MD, 40 mg at 06/17/16 0903  •  ferrous sulfate EC tablet 324 mg, 324 mg, Oral, Daily, Light, Matthew Perry, MD, 324 mg at 06/17/16 0902  •  folic acid (FOLVITE) tablet 0.5 mg, 0.5 mg, Oral, Daily, Light, Matthew Perry, MD, 0.5 mg at 06/17/16 0903  •    furosemide (LASIX) tablet 80 mg, 80 mg, Oral, TID, Larna Daughters., MD, 80 mg at 06/17/16 1257    medroxyPROGESTERone (PROVERA) tablet 1.25 mg, 1.25 mg, Oral, Daily, Light, Clance Boll, MD, 1.25 mg at 06/17/16 2202    nalOXone [facility]) injection 0.1 mg, 0.1 mg, IntraVENOUS, Q2 Min PRN, Light, Clance Boll, MD    ondansetron [facility]) injection 4 mg, 4 mg, IntraVENOUS, Q6H PRN, Victoriano Lain, Riki Sheer, MD    potassium chloride (KLOR-CON) ER tablet 40 mEq, 40 mEq, Oral, Daily, Larna Daughters., MD, 40 mEq at 06/17/16 5427    predniSONE (DELTASONE) tablet 40 mg,  40 mg, Oral, Daily, Majel Homer., MD, 40 mg at 06/17/16 0902    Prostacyclin Cassette Change, , Other, Daily, Poch, Camelia Eng., MD    Prostacyclin Lysle Rubens, , Other, Q6H, Poch, Camelia Eng., MD    Prostacyclin Tubing, , Other, Once per day on Mon Wed Fri, Poch, Camelia Eng., MD    Prostacylin Batteries, , Other, Once per day on Mon, Poch, David S., MD    saliva substitute (CAPHOSOL) solution 5 mL, 5 mL, Oral, PRN, Collene Schlichter, MD    sildenafil (REVATIO, VIAGRA) tablet 80 mg, 80 mg, Oral, TID, Rolm Gala, PHARMD, 80 mg at 06/17/16 1257    sodium chloride (PF) 0.9 % flush 3 mL, 3 mL, IntraVENOUS, Q8H, Light, Clance Boll, MD, 3 mL at 06/17/16 0510    sodium chloride (PF) 0.9 % flush 3 mL, 3 mL, IntraVENOUS, PRN, Marcy Siren, MD, 3 mL at 05/30/16 0623    sodium chloride 0.9 % TKO infusion, , IntraVENOUS, Continuous PRN, Light, Clance Boll, MD    spironolactone (ALDACTONE) tablet 50 mg, 50 mg, Oral, Daily, Light, Clance Boll, MD, 50 mg at 06/17/16 7628    vitamin b-12 (CYANOCOBALAMIN) tablet 500 mcg, 500 mcg, Oral, Daily, Alfonse Ras, MD, 500 mcg at 06/17/16 3151    All:  Allergies   Allergen Reactions    Allopurinol Hives    Levaquin Swelling and Other     Severe joint pain      Ofloxacin Nausea and Vomiting    Chlorhexidine Itching    Sulfa Drugs Unspecified       Soc Hx:  Social History     Social History    Marital status: Married     Spouse name: N/A    Number of children: N/A    Years of education: N/A     Occupational History    Not on file.     Social History Main Topics    Smoking status: Former Smoker     Packs/day: 0.50     Years: 5.00     Quit date: 01/13/1978    Smokeless tobacco: Never Used    Alcohol use No    Drug use: Not on file    Sexual activity: Not on file     Other Topics Concern    Not on file     Social History Narrative       Fam Hx:  Family History   Problem Relation Age of Onset    Heart Disease Mother     Hypertension Mother      Psychiatry Mother     Heart Disease Father        Physical Exam:  Vitals:    06/17/16 0848 06/17/16 0854 06/17/16 1154 06/17/16 1513   BP:   117/58 111/51   BP Location:   Left arm Right arm   BP Patient Position:  Sitting Semi-Fowlers   Pulse:   88 92   Resp:   18 18   Temp:   98.9 F (37.2 C) 98.4 F (36.9 C)   SpO2: 97%  94% 94%   Weight:  68.2 kg (150 lb 4.8 oz)     Height:         Gen: well-appearing, NAD  HEENT: PERRL, no conjunctival injection,  moist mucous membranes, no oral ulcers  Neck: neck supple, no LAD  CV: RRR, normal S1/S2, no M/R/G  Pulm: CTAB, non-labored breathing, no C/W/R, on 4 LNC, decreased breath sounds at bases  Abd: soft, ND, NT, BS present, no organomegaly  Extremities: 3+ bilateral pitting peripheral edema. No cyanosis, digital ulceration noted  Skin: Flushed skin all face and body. No malar rash, ulcers, ecchymoses, livedo reticularis, nodules, tophi, or nail pitting   Neuro: alert and oriented, full strength 5/5 in UE and Diana bilaterally, sensation to touch grossly intact, DTR's 2+ and symmetric, neg Babinski, fine motor coordination intact    MSK:   No muscle tenderness, atrophy, or abnormal movements noted  Hands:    R hand - swelling and mild tenderness in 5th PIP  Wrists: no swelling or tenderness, full ROM bilaterally  Elbows: no nodules or tophi, no swelling or tenderness, full ROM bilaterally without contracture  Shoulders: Fluctuant cyst on anterior shoulder, c/w synovial cyst.  No SC, GH, or AC joint tenderness or swelling. Full ROM bilaterally  Neck: full ROM of C-spine, no tenderness  Hips: full ROM B/L, no tenderness  Knees: No swelling or tenderness bilaterally, cool to touch, full ROM bilaterally, contracture, or laxity  Ankles: No swelling or tenderness bilaterally. Full ROM bilaterally  Feet: No tenderness or swelling at MTPs or IPs of feet, no deformities      Labs:  Lab Results   Component Value Date    WBC 7.3 06/15/2016    RBC 4.42 06/15/2016    HGB 10.7 (L)  06/15/2016    HCT 36.3 06/15/2016    MCV 82.1 06/15/2016    MCHC 29.5 (L) 06/15/2016    RDW 17.8 (H) 06/15/2016    PLT 72 (L) 06/15/2016    PLT 179 09/12/2008    MPV 12.4 06/12/2016     Lab Results   Component Value Date    NA 141 06/17/2016    K 4.4 06/17/2016    CL 98 06/17/2016    BICARB 30 (H) 06/17/2016    BUN 80 (H) 06/17/2016    CREAT 1.07 (H) 06/17/2016    GLU 93 06/17/2016    [place] 10.2 06/17/2016     Lab Results   Component Value Date    AST 21 06/10/2016    ALT 15 06/10/2016    ALK 155 (H) 06/10/2016    TP 7.4 06/10/2016    ALB 3.7 06/10/2016    TBILI 0.35 06/10/2016    DBILI <0.2 06/07/2016     Lab Results   Component Value Date    COLORUA Yellow 06/12/2016    APPEARUA Slightly Hazy 06/12/2016    GLUCOSEUA Negative 06/12/2016    BILIUA Negative 06/12/2016    KETONEUA Negative 06/12/2016    SGUA 1.009 06/12/2016    BLOODUA 3+ (A) 06/12/2016    PHUA 6.0 06/12/2016    PROTEINUA Negative 06/12/2016    UROBILUA Negative 06/12/2016    NITRITEUA Negative 06/12/2016    LEUKESTUA 1+ (A) 06/12/2016    WBCUA 11-20 (A) 06/12/2016    RBCUA >50 (A) 06/12/2016  HYALINEUA >5 (A) 06/09/2016     Results for Diana Chambers, Diana Chambers (MRN [id number]) as of 06/17/2016 19:19   Ref. Range 08/11/2012 16:10 05/02/2016 20:36 05/04/2016 05:30 05/04/2016 14:45 06/08/2016 10:20   Anti-DSDNA Latest Ref Range: 0 - 24 IU >200 (H)  >200 (H)  >200 (H)   C3 Latest Ref Range: 90 - 180 mg/dL 118 119   106   C4 Latest Ref Range: 10 - 40 mg/dL 20 18   17   Cardiolipin Ab, IgA Latest Ref Range: 0 - 11 APL    0    Cardiolipin IgG, Ab Latest Ref Range: 0 - 14 GPL    <10    Cardiolipin IgM, Ab Latest Ref Range: 0 - 12 MPL    0    Jo-1 Ab, IgG Latest Ref Range: 0 - 40 AU/mL    0    RNP Latest Ref Range: 0.0 - 4.9 U/mL    2.4    SCL-70 Ab Latest Ref Range: 0 - 40 AU/mL    2    Smith Ab Latest Ref Range: 0.0 - 6.9 U/mL    3.6    SSA Ab Latest Ref Range: 0.0 - 6.9 U/mL    >240.0 (H)    SSB Ab Latest Ref Range: 0.0 - 6.9 U/mL    58.0 (H)      Results for  Diana Chambers, Diana Chambers (MRN [id number]) as of 06/17/2016 19:19   Ref. Range 04/17/2003 13:45 12/21/2006 13:30 12/24/2009 13:45 05/06/2016 05:41 05/15/2016 17:02 05/16/2016 06:07 06/02/2016 05:15   Uric Acid Latest Ref Range: 2.4 - 5.7 mg/dL 9.4 (H) 10.2 (H) 10.3 (H) 11.8 (H) 8.7 (H) 8.6 (H) 8.4 (H)     CXR 06/05/16  1. Stable position of the tunneled left transjugular central venous catheter terminating mid right atrium.  2. Grossly enlarged cardiac silhouette, marked dilation of the main and hilar pulmonary arteries. Query trace interstitial pulmonary edema.  3. No acute pulmonary consolidation. Minimal atelectasis right lung base.  4. Trace bilateral pleural effusions with no pneumothorax.  5. Severe pulmonary hypertension, secondary right heart, pulmonary artery enlargement.      Chest CT 05/2009  No pulmonary embolism.  Enlargement of the main, right, and left pulmonary arteries along with right heart dilatation, related to pulmonary hypertension, similar to the prior study on 10/10/2006.    Chest CT  06/2010  Redemonstrated evidence of pulmonary hypertension.  Also relatively unchanged but extensive bilateral lung abnormalities, some of which may be seen with pulmonary hypertension.  However, follicular bronchiolitis / lymphocytic interstitial pneumonitis is in the differential.  If the pulmonary   hypertension is unexplained, then pulmonary veno-occlusive disease / capillary hemangiomatosis may be considered.    Chest CT 05/04/16  1. Severe enlargement of the pulmonary artery with increased calcification along the pulmonary arterial walls compatible with longstanding pulmonary hypertension. Progressed parenchymal changes of the lungs compatible with pulmonary hypertension .  2. Increased thin wall l cystic esions seen within the lungs, nonspecific be can be seen with underlying lymphocytic interstitial pneumonia, which can be seen in patients with lupus/Sjogren's disease.  3. Cardiomegaly with evidence of right heart  dysfunction. Trace nonspecific pericardial fluid with pericardial thickening suspected. In a patient with known lupus underlying pericarditis/serositis may be present. Correlation with echocardiogram is recommended.  4. Likely reactive adenopathy of the thorax.    CT scan of the abdomen and pelvis without IV contrast 06/07/16  Sequelae of volume overload, likely secondary to right heart dysfunction.  No intra-abdominal mass is identified.    Cholelithiasis without evidence of acute cholecystitis.  Findings of pulmonary arterial hypertension are better evaluated on CT thorax from 05/04/2016.  Note that assessment of solid organs is limited in the absence of intravenous contrast.    TTE 06/11/16:  LVEF 88%  1. The left ventricular size is normal and the left ventricular systolic function is normal.  2. Mild or grade I (impaired relaxation pattern) left ventricular diastolic filling.  3. The right ventricular size is severely enlarged and systolic function is reduced.  4. Moderate tricuspid regurgitation.  5. The aortic valve is mildly stenotic.  6. Severe pulmonary hypertension with right ventricular systolic pressure measuring 75 mmHg.  7. Compared to prior study no significant change.    Imaging:  _0 @    Assessment:  Diana Chambers is a 66 year old  history of SLE, Sjogren syndrome, CKD, LIP, PAH, and migratory joint swelling who was admitted 05/27/16 for RHC and fluid overlap. Rheumatology consulted for management of SLE and contribution of SLE to [facility].      #SLE: diagnosed with SLE on a basis of + ANA 1:640, SSA, SSB, dsDNA > 200. She was being followed by Dr. Joaquin Bend until 2015. She was told that despite having elevated antibody titers, her SLE was clinically quiescent and she was never put on medical therapy. She has normocytic anemia, thrombocytopenia, and elevated dsDNA titers, but clincally she does not have predominant SLE symptoms at this time.    #Sjogren syndrome: clinical features that  support this diagnosis include sicca symptoms of the eyes and mouth, +SSA, +SSB, and LIP. No salivary biopsy or ultrasound. LIP is more commonly seen in Sjogren syndrome than SLE, and may be the cause of PAH.     #LIP: seen initially on chest CT 06/2010 with  extensive bilateral lung abnormalities with DDX including follicular bronchiolitis / lymphocytic interstitial pneumonitis, pulmonary veno-occlusive disease, capillaryhemangiomatosis. Repeat chest CT 04/2016 shows increased thin wall l cystic esions seen within the lungs, reactive LAD, and severe cardiomegaly and PA enlargement. Concern that LIP is untreated and may be the cause of PAH. May warrant targeted immunosuppressive therapy if deemed appropriate by Pulmonology team.     #Hyperuricemia, migratory joint swelling: Uric acid as high as 11.8 with migratory joint swelling, concerning for gout but no arthrocentesis. Currently has synovitis of the R hand 5th PIP and cyst on anterior R shoulder which may be a synovial cyst. No tophi. Migratory arthralgias also raise suspicion for CPPD.    Recommendations:  - Check uric acid, repeat UA, [facility] comprehensive scleroderma panel (misc lab send-out test)  - XR R shoulder and bilateral wrists for gout vs CPPD  - Assessment by Pulmonology of LIP status and treatment with steroids and/or other immunosuppresive agents (RTX, tacrolimus, abatacept)    Thank you for allowing [place] to participate in the care of this patient.  Please do not hesitate to call with any questions.  Patient seen and discussed with Dr. Francee Piccolo, MD  Rheumatology Fellow

## 2016-06-18 ENCOUNTER — Inpatient Hospital Stay (HOSPITAL_COMMUNITY): Payer: Medicare Other

## 2016-06-18 DIAGNOSIS — Z4682 Encounter for fitting and adjustment of non-vascular catheter: Secondary | ICD-10-CM

## 2016-06-18 DIAGNOSIS — M1811 Unilateral primary osteoarthritis of first carpometacarpal joint, right hand: Secondary | ICD-10-CM

## 2016-06-18 DIAGNOSIS — M19012 Primary osteoarthritis, left shoulder: Secondary | ICD-10-CM

## 2016-06-18 DIAGNOSIS — M19032 Primary osteoarthritis, left wrist: Secondary | ICD-10-CM

## 2016-06-18 LAB — URINALYSIS WITH CULTURE REFLEX, WHEN INDICATED
Bilirubin: NEGATIVE
Blood: NEGATIVE
Glucose: NEGATIVE
Hyaline Cast: >5 — AB (ref 0–?)
Ketones: NEGATIVE
Leuk Esterase: NEGATIVE
Nitrite: NEGATIVE
Protein: NEGATIVE
Specific Gravity: 1.008 (ref 1.002–1.030)
Urobilinogen: NEGATIVE
pH: 6 (ref 5.0–8.0)

## 2016-06-18 LAB — BASIC METABOLIC PANEL, BLOOD
Anion Gap: 9 mmol/L (ref 7–15)
BUN: 80 mg/dL — ABNORMAL HIGH (ref 8–23)
Bicarbonate: 34 mmol/L — ABNORMAL HIGH (ref 22–29)
Calcium: 10.1 mg/dL (ref 8.5–10.6)
Chloride: 101 mmol/L (ref 98–107)
Creatinine: 1.11 mg/dL — ABNORMAL HIGH (ref 0.51–0.95)
GFR: 49 mL/min
Glucose: 83 mg/dL (ref 70–99)
Potassium: 3.8 mmol/L (ref 3.5–5.1)
Sodium: 144 mmol/L (ref 136–145)

## 2016-06-18 MED ORDER — FUROSEMIDE 40 MG OR TABS
120.0000 mg | ORAL_TABLET | Freq: Three times a day (TID) | ORAL | Status: DC
Start: 2016-06-18 — End: 2016-06-21
  Administered 2016-06-18 – 2016-06-21 (×9): 120 mg via ORAL
  Filled 2016-06-18 (×9): qty 3

## 2016-06-18 MED ORDER — METOLAZONE 5 MG OR TABS
5.0000 mg | ORAL_TABLET | Freq: Every day | ORAL | Status: DC
Start: 2016-06-18 — End: 2016-06-21
  Administered 2016-06-18 – 2016-06-21 (×4): 5 mg via ORAL
  Filled 2016-06-18 (×4): qty 1

## 2016-06-18 NOTE — Progress Notes (Signed)
PULMONARY VASCULAR PROGRESS NOTE     06/18/16     Current Hospital Stay:   22 days - Admitted on: 05/27/2016    Subjective:  Ambulated a lot yesterday and did not feel short of breath. Feels that there may have been slight reaccumulation of fluid in her legs in the last 24 hours.  Denies any focal joint pain.  Has not had any chest pain or cough.  No dizziness/lightheadedness.     Objective:  - Lasix transitioned to 80mg  PO TID  - received Lasix 100mg  IV x1 overnight  - Metolazone 5mg  today    Current Medications:   digoxin  250 mcg QPM    docusate sodium  250 mg Daily    enoxaparin  40 mg Daily    febuxostat  40 mg Daily    ferrous sulfate  681 mg Daily    folic acid  0.5 mg Daily    furosemide  120 mg TID    medroxyPROGESTERone  1.25 mg Daily    metolazone  5 mg Daily    potassium chloride  40 mEq Daily    predniSONE  40 mg Daily    Prostacyclin Cassette Change   Daily    Prostacyclin Ice Pack   Q6H    Prostacyclin Tubing   Once per day on Mon Wed Fri    Prostacylin Batteries   Once per day on Mon    sildenafil  80 mg TID    sodium chloride (PF)  3 mL Q8H    spironolactone  50 mg Daily    vitamin b-12  500 mcg Daily      epoprostenol (FLOLAN) infusion 35 ng/kg/min (06/17/16 2000)    sodium chloride        acetaminophen  650 mg Q4H PRN    nalOXone  0.1 mg Q2 Min PRN    ondansetron  4 mg Q6H PRN    saliva substitute  5 mL PRN    sodium chloride (PF)  3 mL PRN    sodium chloride   Continuous PRN       Review of Systems:   Nutrition: Diet Custom: 2 Gram Sodium     Vital Signs:  Temperature:  [97.5 F (36.4 C)-98.6 F (37 C)] 98.5 F (36.9 C) (06/06 2751)  Blood pressure (BP): (111-146)/(51-85) 138/61 (06/06 1151)  Heart Rate:  [84-92] 90 (06/06 1151)  Respirations:  [18-22] 22 (06/06 1151)  Pain Score: 0 (06/06 1151)  O2 Device: Nasal cannula (06/06 1151)  O2 Flow Rate (L/min):  [3 l/min] 3 l/min (06/06 1151)  SpO2:  [92 %-97 %] 95 % (06/06 1151)  RASS Score: Alert and calm    Wt Readings  from Last 1 Encounters:   06/18/16 73.8 kg (162 lb 11.2 oz)     Intake/Output (Current Shift):  06/05 0600 - 06/06 0559  In: 7001 [P.O.:1500; I.V.:3]  Out: 2975 [Urine:2975]    Physical Exam:  General:  NAD, sitting in chair at bedside with legs elevated  HEENT:  Mucus membranes dry  Neck:  JVP ~10cm  Lungs:  Bibasilar crackles, otherwise clear to auscultation  Chest: tunneled line in left chest, c/d/i  CV:  RRR, S1, prominent S2, faint murmur at upper sternal border  Abdomen:  Soft, nontender, mildly distended  GU:  No foley  Extremities:  1+ pitting edema, warm  Skin: diffuse flushing throughout, petechial rash bilateral medial thighs  Neuro:  Alert, oriented    Diagnostic Data:  Laboratory data:  Lab Results   Component Value Date    K 3.8 06/18/2016    CL 101 06/18/2016    BICARB 34 (H) 06/18/2016    BUN 80 (H) 06/18/2016    CREAT 1.11 (H) 06/18/2016    GLU 83 06/18/2016    [place] 10.1 06/18/2016     Assessment and Plan:  Diana Chambers is a 66 year [place] old female with SLE/Sjogren's and WHO Group I PAH who was admitted 5/15 after RHC was concerning for worsening hemodynamics. She has been on triple therapy including flolan although opsumit stopped. Now doing well with steroids and diuresis.       # WHO Group I PAH with RV Overload:  RHC 5/15 with PAP 92/37/62, PAOP 12, tdPVR 9.28 WU and normal CO/CI. Currently on flolan 35ng/kg/min.    - Increase Lasix to 120mg  PO TID today  - Metolazone 5mg  x1  - continue Aldactone 50mg  daily  - continue PAHtreatment with PRA with flolan (currently at 35ng/kg/min) and PDE-I with sildenafil 80 TID; opsumit stopped(continue to hold)  - follow up rheumatologic studies (repeat DsDNA, C3, C4)  - continue prednisone 40mg  daily  - appreciate Rheumatology recommendations, will follow up with team today    # Acute on chronic hypoxemic respiratory failure:  Now improved, stable on 3LPM  - continue supplemental O2   - continue treatment of PAH, as above   - continue  diuresis as outlined above   - continue steroids, started 5/28  - has declined sleep study    # ARF on CKD: Renal function overall stable  - completed CTX x3 days from 5/31 for suspected UTI  - continue treatment of PAH as above  - continue diuresis  - monitor sCr and UOP  - avoid nephrotoxins and renally dose medications     # Anemia/Thrombocytopenia:Overall stable. No sign of infectious process, TSH 3.49, Fe 26, Transferritin 191, ferritin 47; RBC folate >1240B12 265(5/22)  - continue oral Fe and B12    # Gout: last uric acid 8.6 (5/21), improved from 11.8 (4/24)  - continue uloric for now     # PPx:  - DVT: LMWH 40 daily; monitor renal function  - Stress Ulcer: none   - Bowels: docusate   - Prn: APAP, zofran    # Code:  - FC/FT      Code Status      Full Code - Call Code    The patient was discussed with attending Dr. [name].    Collene Schlichter MD  PCCM fellow  [phone]    Pulmonary Vascular Attending Addendum:  Patient was examined with Dr. Kalman Shan MD  Labs, imaging and tests reviewed.  Please see above note for full details and plan.    66 yo F with WHO I PAH associated with CTD admitted following RHC 5/15/18with acute on chronic right heart failure and volume overload. Also has background of CTD associated ILD. Chest imaging with possible LIP although has had minimal changes on CT scan over numerous years. Now with edema/anasarca that was poorly responsive to diuretics until macitentan discontinued and patient started on steroids.     -Continue prednisone 40 mgdaily to treat underlying inflammatory etiology for decompensation (LIP and ? Other organ involvement)  -Repeat DS DNA pending.  C3 C4 pending  -[facility] scleroderma panel was previously ordered.  Results in media tab.  -will check XRAY shoulder and wrist per rheum recs.   -Cr stable  -Transitioned to PO lasix yesterday and required additional IV  lasix to achieve net negative status.  Will increase oral lasix dose and add metolazone  -Continue  prior home digoxin 250 mcg PO QDAY   -Continue IV epo 35ng/kg/min  -Continue sildenafil 80 mg TID  -Continue uloric for gout  -Follow thrombocytopenia    [name] M.D.  Pulmonary and Critical Care Attending  Pager [phone]

## 2016-06-18 NOTE — Plan of Care (Signed)
Problem: Fluid Volume Excess  Goal: Absence of edema; peripheral and/or pulmonary  Outcome: Unable to meet goal at this time.  Pt continues with 3+ pitting edema to LE bilaterally. She was bridged to PO lasix on 6/5 and is now on IV lasix with good UOP and remains on 1.5L/day fluid restriction. Will continue to monitor and assess.

## 2016-06-18 NOTE — Plan of Care (Signed)
Problem: Breathing Pattern - Ineffective  Goal: Respiratory rate, rhythm and depth return to baseline  Outcome: Progressing toward goal, anticipate improvement over: >48 hours  Breathing is regular and non labored, chest expansion is symmetrical, breath sounds are clear with crackles at the bases;  02 sats remain > 92% on 3L 02 via NC. Will continue to monitor and assess.    Problem: Tissue Perfusion, Cardiopulmonary - Altered  Goal: Hemodynamic stability  Outcome: Progressing toward goal, anticipate improvement over: >48 hours  Pt A&O, VSS stable, ST with 1 degree AVB; pulses palpable, 3+ pitting edema to LE bilaterally with crackles at lung bases.     Problem: Falls, Risk of  Goal: Keep patient free from falls utilizing universal fall precautions  Outcome: Goal Met  Pt is free of falls and injury and is a high  fall risk. Bed in low position, wheels locked, refuses bed alarm, calls for assistance prior to exiting bed; yellow anti skid socks and yellow charm utilized, call button within reach, and environmental safety check is performed with each hourly rounding. Will continue to monitor and assess.

## 2016-06-18 NOTE — Plan of Care (Signed)
Problem: Fluid Volume Excess  Goal: Absence of edema; peripheral and/or pulmonary  Outcome: Progressing toward goal, anticipate improvement over: >48 hours  Peripheral edema noted & crackles to bilateral bases noted on auscultation.  Po diuretics administered as ordered with increased urine output.     Problem: Breathing Pattern - Ineffective  Goal: Respiratory rate, rhythm and depth return to baseline  Outcome: Goal Met  No s/s of distress noted.  Mild DOE.     Problem: Tissue Perfusion, Cardiopulmonary - Altered  Goal: Hemodynamic stability  Outcome: Goal Met  VSS.    Problem: Falls, Risk of  Goal: Keep patient free from falls utilizing universal fall precautions  Outcome: Goal Met  Ambulatory with stable gait. Independent with adl's and ambulating in halls with husband's assistance.

## 2016-06-19 LAB — PHOSPHORUS, BLOOD: Phosphorous: 3 mg/dL (ref 2.7–4.5)

## 2016-06-19 LAB — BASIC METABOLIC PANEL, BLOOD
Anion Gap: 10 mmol/L (ref 7–15)
BUN: 86 mg/dL — ABNORMAL HIGH (ref 8–23)
Bicarbonate: 36 mmol/L — ABNORMAL HIGH (ref 22–29)
Calcium: 10.4 mg/dL (ref 8.5–10.6)
Chloride: 95 mmol/L — ABNORMAL LOW (ref 98–107)
Creatinine: 1.24 mg/dL — ABNORMAL HIGH (ref 0.51–0.95)
GFR: 43 mL/min
Glucose: 80 mg/dL (ref 70–99)
Potassium: 3.5 mmol/L (ref 3.5–5.1)
Sodium: 141 mmol/L (ref 136–145)

## 2016-06-19 LAB — MAGNESIUM, BLOOD: Magnesium: 2.3 mg/dL (ref 1.6–2.4)

## 2016-06-19 LAB — C4, BLOOD: C4: 12 mg/dL (ref 10–40)

## 2016-06-19 LAB — URIC ACID, BLOOD: Uric Acid: 9 mg/dL — ABNORMAL HIGH (ref 2.4–5.7)

## 2016-06-19 LAB — ANTI-DSDNA, BLOOD: Anti-DSDNA: >200 IU — ABNORMAL HIGH (ref 0–24)

## 2016-06-19 LAB — C3, BLOOD: C3: 82 mg/dL — ABNORMAL LOW (ref 90–180)

## 2016-06-19 MED ORDER — FLUCONAZOLE 100 MG OR TABS
150.00 mg | ORAL_TABLET | Freq: Once | ORAL | Status: AC
Start: 2016-06-19 — End: 2016-06-19
  Administered 2016-06-19: 150 mg via ORAL
  Filled 2016-06-19: qty 2

## 2016-06-19 NOTE — Plan of Care (Signed)
Problem: Fluid Volume Excess  Goal: Absence of edema; peripheral and/or pulmonary  Outcome: Progressing toward goal, anticipate improvement over: >48 hours  BLE edema noted.  Receiving diuretics as ordered and remains on 1.5 fluid restriction. See I&O flow sheet.    Problem: Breathing Pattern - Ineffective  Goal: Respiratory rate, rhythm and depth return to baseline  Outcome: Goal Met  Monitored breathing and lung sounds. Patient instructed to report difficulty breathing/shortness of breath. Patient reported no dyspnea so far this shift. See assessment for pulmonary details.  No acute respiratory distress noted.      Monitored Flolan administration. Additional CADD pump at bedside at all times. Flolan infusing without interruption tosubclavian Hickman. See MAR for Flolan concetration and rate of infusion.  Flushing noted to skin but no other side effects noted at this time.         Problem: Tissue Perfusion, Cardiopulmonary - Altered  Goal: Hemodynamic stability  Outcome: Goal Met  SR w/1st degree block. Peripheral pulses palpable. Patient denies chest pain, pressure and palpitations.     Problem: Falls, Risk of  Goal: Keep patient free from falls utilizing universal fall precautions  Outcome: Goal Met  Assessed and monitored patients safety, bedrails up x2 while in bed, bed in lowest and locked position, call light and belongings within reach, non-skid footwear on, instructed patient  to call for assistance.  No incident of fall or injury noted.

## 2016-06-19 NOTE — Progress Notes (Signed)
Rheumatology Consult Note    Requesting provider: Dr. [name]  Date of admission: 05/27/16  Date of initial consult: 06/17/16      Diana Chambers is a 66 year old female with a history of SLE, Sjogren syndrome, LIP, PAH, and migratory joint swelling who was admitted 05/27/16 for RHC and fluid overlap. Rheumatology consulted for management of SLE and contribution of SLE to [facility].     Interim Hx:   Feeling well. She can in her room easily. Denies fevers, chills, chest pain, malar rash, photosensitivity, joint pain, Raynaud's phenomenon.     HPI: initial consult on 06/17/16  Several years ago she had an instance of eye irritation and pain while traveling, saw an ophthalmologist who did tests and discovered she had +SSA and referred her to a rheumatologist. She was diagnosed with SLE on a basis of + ANA 1:640, SSA, SSB, dsDNA > 200, and was being followed by Dr. Joaquin Bend until 2015. She was told that despite having elevated antibody titers, her SLE was clinically quiescent and she was never put on medical therapy. She also has pulmonary HTN, diagnosed 17 years ago before she receiving a diagnosis of SLE. CT showed lymphocytic interstitial pneumonitis (LIP). She has been on Flolan IV for PAH, and has received intermittent prednisone bursts. She has been having progressive SOB and peripheral edema, and was admitted 05/27/16 for RHC. During this admission, she has received diuresis and diagnosed with MSSA UTI treated with ceftriaxone x 3 days. She was also given prednisone 40 mg bid starting 06/16/16, and reports that she had difficulty urinating until she was given prednisone and a Foley catheter was inserted (immediately had 350 cc urine output). She feels that she is retaining water less now. At this time, she reports SOB, dry mouth, and dry eyes. She has facial flushing while on Flolan. Denies chest pain, joint pain, oral ulcers, rashes, alopecia, Raynaud's phenomenon, muscle weakness, abdominal pain, N/V/C/D, and  photosensitivity.       ROS:   Denies fevers, chills, weight loss, oral ulcers, alopecia, skin rash or thickening, Raynaud's phenomenon, dysphagia, reflux symptoms, diarrhea, abdominal pain, nausea, or vomiting, hematochezia, hematuria, or hemoptysis.     14-pt ROS reviewed and otherwise negative.    PMH:  SLE  Sjogren Syndrome  CKD  PAH  Gout?    Past Medical History:   Diagnosis Date    Pulmonary HTN     Sjoegren syndrome (CMS-HCC) 07/12/2009       Past Surgical Hx:  No past surgical history on file.    Current Meds:    Current Facility-Administered Medications:     acetaminophen (TYLENOL) tablet 650 mg, 650 mg, Oral, Q4H PRN, Marcy Siren, MD, 650 mg at 06/04/16 3664    digoxin (LANOXIN) tablet 250 mcg, 250 mcg, Oral, QPM, Larna Daughters., MD, 250 mcg at 06/18/16 1835    docusate sodium (COLACE) capsule 250 mg, 250 mg, Oral, Daily, Larna Daughters., MD, 250 mg at 06/19/16 0842    enoxaparin (LOVENOX) injection 40 mg, 40 mg, Subcutaneous, Daily, Larna Daughters., MD, 40 mg at 06/19/16 0844    epoprostenol (FLOLAN) 60,000 ng/mL in glycine diluent 100 mL infusion, 35 ng/kg/min (Order-Specific), IntraVENOUS, Continuous, Poch, Camelia Eng., MD, Last Rate: 3.01 mL/hr at 06/19/16 0830, 35 ng/kg/min at 06/19/16 0830    febuxostat (ULORIC) tablet 40 mg, 40 mg, Oral, Daily, Light, Clance Boll, MD, 40 mg at 06/19/16 0842    ferrous sulfate EC tablet 324 mg, 324 mg, Oral,  Daily, Marcy Siren, MD, 324 mg at 69/48/54 6270    folic acid (FOLVITE) tablet 0.5 mg, 0.5 mg, Oral, Daily, Light, Clance Boll, MD, 0.5 mg at 06/19/16 3500    furosemide (LASIX) tablet 120 mg, 120 mg, Oral, TID, Collene Schlichter, MD, 120 mg at 06/19/16 9381    medroxyPROGESTERone (PROVERA) tablet 1.25 mg, 1.25 mg, Oral, Daily, Light, Clance Boll, MD, 1.25 mg at 06/19/16 8299    metolazone (ZAROXOLYN) tablet 5 mg, 5 mg, Oral, Daily, Larna Daughters., MD, 5 mg at 06/19/16 0843    nalOXone [facility]) injection 0.1 mg, 0.1 mg,  IntraVENOUS, Q2 Min PRN, Light, Clance Boll, MD    ondansetron [facility]) injection 4 mg, 4 mg, IntraVENOUS, Q6H PRN, Alfonse Ras, MD    potassium chloride (KLOR-CON) ER tablet 40 mEq, 40 mEq, Oral, Daily, Larna Daughters., MD, 40 mEq at 06/19/16 0844    predniSONE (DELTASONE) tablet 40 mg, 40 mg, Oral, Daily, Majel Homer., MD, 40 mg at 06/19/16 3716    Prostacyclin Cassette Change, , Other, Daily, Poch, Camelia Eng., MD    Prostacyclin Lysle Rubens, , Other, Q6H, Poch, Camelia Eng., MD, 2 each at 06/19/16 0600    Prostacyclin Tubing, , Other, Once per day on Mon Wed Fri, Poch, Camelia Eng., MD    Prostacylin Batteries, , Other, Once per day on Mon, Poch, David S., MD    saliva substitute (CAPHOSOL) solution 5 mL, 5 mL, Oral, PRN, Collene Schlichter, MD    sildenafil (REVATIO, VIAGRA) tablet 80 mg, 80 mg, Oral, TID, Rolm Gala, PHARMD, 80 mg at 06/19/16 0846    sodium chloride (PF) 0.9 % flush 3 mL, 3 mL, IntraVENOUS, Q8H, Light, Clance Boll, MD, 3 mL at 06/19/16 0525    sodium chloride (PF) 0.9 % flush 3 mL, 3 mL, IntraVENOUS, PRN, Marcy Siren, MD, 3 mL at 05/30/16 9678    sodium chloride 0.9 % TKO infusion, , IntraVENOUS, Continuous PRN, Light, Clance Boll, MD    spironolactone (ALDACTONE) tablet 50 mg, 50 mg, Oral, Daily, Light, Clance Boll, MD, 50 mg at 06/19/16 9381    vitamin b-12 (CYANOCOBALAMIN) tablet 500 mcg, 500 mcg, Oral, Daily, Alfonse Ras, MD, 500 mcg at 06/19/16 [phone]    All:  Allergies   Allergen Reactions    Allopurinol Hives    Levaquin Swelling and Other     Severe joint pain      Ofloxacin Nausea and Vomiting    Chlorhexidine Itching    Sulfa Drugs Unspecified       Soc Hx:  Social History     Social History    Marital status: Married     Spouse name: N/A    Number of children: N/A    Years of education: N/A     Occupational History    Not on file.     Social History Main Topics    Smoking status: Former Smoker     Packs/day: 0.50     Years:  5.00     Quit date: 01/13/1978    Smokeless tobacco: Never Used    Alcohol use No    Drug use: Not on file    Sexual activity: Not on file     Other Topics Concern    Not on file     Social History Narrative       Fam Hx:  Family History   Problem Relation Age of Onset    Heart Disease Mother  Hypertension Mother     Psychiatry Mother     Heart Disease Father        Physical Exam:  Vitals:    06/19/16 0000 06/19/16 0514 06/19/16 0800 06/19/16 0905   BP: 130/59 135/69 137/53    BP Location: Right arm Right arm Right arm    BP Patient Position: Sitting Supine Sitting    Pulse: 90 92 86    Resp: _0 Temp: 96.5 F (35.8 C) 96.4 F (35.8 C) 98.7 F (37.1 C)    SpO2: 94% 95% 99% 96%   Weight:  73 kg (160 lb 15 oz)     Height:         Gen: well-appearing, NAD  HEENT: PERRL, no conjunctival injection,  moist mucous membranes, no oral ulcers  Neck: neck supple, no LAD  CV: RRR, normal S1/S2, no M/R/G  Pulm: CTAB, non-labored breathing, no C/W/R, on 4 LNC, decreased breath sounds at bases  Abd: soft, ND, NT, BS present, no organomegaly  Extremities: 3+ bilateral pitting peripheral edema. No cyanosis, digital ulceration noted  Skin: Flushed skin all face and body. No malar rash, ulcers, ecchymoses, livedo reticularis, nodules, tophi, or nail pitting   Neuro: alert and oriented, full strength 5/5 in UE and LE bilaterally, sensation to touch grossly intact, DTR's 2+ and symmetric, neg Babinski, fine motor coordination intact    MSK:   No muscle tenderness, atrophy, or abnormal movements noted  Hands:    R hand - swelling and mild tenderness in 5th PIP  Wrists: no swelling or tenderness, full ROM bilaterally  Elbows: no nodules or tophi, no swelling or tenderness, full ROM bilaterally without contracture  Shoulders: Fluctuant cyst on anterior shoulder, c/w synovial cyst.  No SC, GH, or AC joint tenderness or swelling. Full ROM bilaterally  Neck: full ROM of C-spine, no tenderness  Hips: full ROM B/L, no  tenderness  Knees: No swelling or tenderness bilaterally, cool to touch, full ROM bilaterally, contracture, or laxity  Ankles: No swelling or tenderness bilaterally. Full ROM bilaterally  Feet: No tenderness or swelling at MTPs or IPs of feet, no deformities      Labs:  Lab Results   Component Value Date    WBC 7.3 06/15/2016    RBC 4.42 06/15/2016    HGB 10.7 (L) 06/15/2016    HCT 36.3 06/15/2016    MCV 82.1 06/15/2016    MCHC 29.5 (L) 06/15/2016    RDW 17.8 (H) 06/15/2016    PLT 72 (L) 06/15/2016    PLT 179 09/12/2008    MPV 12.4 06/12/2016     Lab Results   Component Value Date    NA 141 06/19/2016    K 3.5 06/19/2016    CL 95 (L) 06/19/2016    BICARB 36 (H) 06/19/2016    BUN 86 (H) 06/19/2016    CREAT 1.24 (H) 06/19/2016    GLU 80 06/19/2016    [place] 10.4 06/19/2016     Lab Results   Component Value Date    AST 21 06/10/2016    ALT 15 06/10/2016    ALK 155 (H) 06/10/2016    TP 7.4 06/10/2016    ALB 3.7 06/10/2016    TBILI 0.35 06/10/2016    DBILI <0.2 06/07/2016     Lab Results   Component Value Date    COLORUA Straw 06/18/2016    APPEARUA Clear 06/18/2016    GLUCOSEUA Negative 06/18/2016    BILIUA Negative 06/18/2016  KETONEUA Negative 06/18/2016    SGUA 1.008 06/18/2016    BLOODUA Negative 06/18/2016    PHUA 6.0 06/18/2016    PROTEINUA Negative 06/18/2016    UROBILUA Negative 06/18/2016    NITRITEUA Negative 06/18/2016    LEUKESTUA Negative 06/18/2016    WBCUA 0-2 06/18/2016    RBCUA 0-2 06/18/2016    HYALINEUA >5 (A) 06/18/2016     Results for MARYALICE, PASLEY (MRN [id number]) as of 06/17/2016 19:19   Ref. Range 08/11/2012 16:10 05/02/2016 20:36 05/04/2016 05:30 05/04/2016 14:45 06/08/2016 10:20   Anti-DSDNA Latest Ref Range: 0 - 24 IU >200 (H)  >200 (H)  >200 (H)   C3 Latest Ref Range: 90 - 180 mg/dL 118 119   106   C4 Latest Ref Range: 10 - 40 mg/dL _0 Cardiolipin Ab, IgA Latest Ref Range: 0 - 11 APL    0    Cardiolipin IgG, Ab Latest Ref Range: 0 - 14 GPL    <10    Cardiolipin IgM, Ab Latest Ref  Range: 0 - 12 MPL    0    Jo-1 Ab, IgG Latest Ref Range: 0 - 40 AU/mL    0    RNP Latest Ref Range: 0.0 - 4.9 U/mL    2.4    SCL-70 Ab Latest Ref Range: 0 - 40 AU/mL    2    Smith Ab Latest Ref Range: 0.0 - 6.9 U/mL    3.6    SSA Ab Latest Ref Range: 0.0 - 6.9 U/mL    >240.0 (H)    SSB Ab Latest Ref Range: 0.0 - 6.9 U/mL    58.0 (H)      Results for LAURYL, SEYER (MRN [id number]) as of 06/17/2016 19:19   Ref. Range 04/17/2003 13:45 12/21/2006 13:30 12/24/2009 13:45 05/06/2016 05:41 05/15/2016 17:02 05/16/2016 06:07 06/02/2016 05:15   Uric Acid Latest Ref Range: 2.4 - 5.7 mg/dL 9.4 (H) 10.2 (H) 10.3 (H) 11.8 (H) 8.7 (H) 8.6 (H) 8.4 (H)     CXR 06/05/16  1. Stable position of the tunneled left transjugular central venous catheter terminating mid right atrium.  2. Grossly enlarged cardiac silhouette, marked dilation of the main and hilar pulmonary arteries. Query trace interstitial pulmonary edema.  3. No acute pulmonary consolidation. Minimal atelectasis right lung base.  4. Trace bilateral pleural effusions with no pneumothorax.  5. Severe pulmonary hypertension, secondary right heart, pulmonary artery enlargement.      Chest CT 05/2009  No pulmonary embolism.  Enlargement of the main, right, and left pulmonary arteries along with right heart dilatation, related to pulmonary hypertension, similar to the prior study on 10/10/2006.    Chest CT  06/2010  Redemonstrated evidence of pulmonary hypertension. Also relatively unchanged but extensive bilateral lung abnormalities, some of which may be seen with pulmonary hypertension. However, follicular bronchiolitis / lymphocytic interstitial pneumonitis is in the differential. If the pulmonary   hypertension is unexplained, then pulmonary veno-occlusive disease / capillaryhemangiomatosis may be considered.    Chest CT 05/04/16  1. Severe enlargement of the pulmonary artery with increased calcification along the pulmonary arterial walls compatible with longstanding pulmonary  hypertension. Progressed parenchymal changes of the lungs compatible with pulmonary hypertension .  2. Increased thin wall l cystic esions seen within the lungs, nonspecific be can be seen with underlying lymphocytic interstitial pneumonia, which can be seen in patients with lupus/Sjogren's disease.  3. Cardiomegaly with evidence of right heart dysfunction. Trace nonspecific pericardial  fluid with pericardial thickening suspected. In a patient with known lupus underlying pericarditis/serositis may be present. Correlation with echocardiogram is recommended.  4. Likely reactive adenopathy of the thorax.    CT scan of the abdomen and pelvis without IV contrast 06/07/16  Sequelae of volume overload, likely secondary to right heart dysfunction.  No intra-abdominal mass is identified.  Cholelithiasis without evidence of acute cholecystitis.  Findings of pulmonary arterial hypertension are better evaluated on CT thorax from 05/04/2016.  Note that assessment of solid organs is limited in the absence of intravenous contrast.    TTE 06/11/16:  LVEF 88%  1. The left ventricular size is normal and the left ventricular systolic function is normal.  2. Mild or grade I (impaired relaxation pattern) left ventricular diastolic filling.  3. The right ventricular size is severely enlarged and systolic function is reduced.  4. Moderate tricuspid regurgitation.  5. The aortic valve is mildly stenotic.  6. Severe pulmonary hypertension with right ventricular systolic pressure measuring 75 mmHg.  7. Compared to prior study no significant change.    Imaging:    XR L shoulder & bilateral wrists  06/18/16  Left wrist 2 images:  There is no fracture, dislocation or acute abnormality. There is no swelling or soft tissue calcification. There is mild first carpometacarpal osteoarthrosis. The carpus is congruent. The soft tissues appear normal.    Right wrist 2 images:  There is no fracture, dislocation or acute abnormality. There is no  swelling or soft tissue calcification. There is mild/moderate first carpometacarpal osteoarthrosis and mild arthrosis at the interphalangeal joint of the thumb. The carpus is congruent.    Left shoulder 3 images:  There is no fracture, dislocation or acute abnormality. There are no soft tissue calcifications. There is mild acromioclavicular osteoarthrosis. Left internal jugular catheter is noted. There are stigmata of pulmonary hypertension with pulmonary arterial dilatation better seen on prior chest x-ray.    IMPRESSION:  No evidence of crystalline arthropathy or erosive disease. No acute abnormalities. 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      Assessment:  Diana Chambers is a 66 year old  history of SLE, Sjogren syndrome, CKD, LIP, PAH, and migratory joint swelling who was admitted 05/27/16 for RHC and fluid overlap. Rheumatology consulted for management of SLE and contribution of SLE to [facility].      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 On review of Dr. Ermalinda Barrios notes from 2013-2015, she did not have any SLE symptoms or clinical findings but only serologic tests suggesting SLE. She was told that despite having elevated antibody titers, her SLE was clinically quiescent and she was never put on medical therapy. It would difficult to make a case that her SLE/CTD is active now and the cause of PAH. Renal function normal with bland urine sediment, no evidence of renal dysfunction. As an outpatient, adding HCQ might be of some benefit if she agreeable to it but unlikely to have major disease-modifying effect on PAH or LIP.     #Sjogren syndrome: clinical features that support this diagnosis include sicca symptoms of the eyes and mouth, +SSA, +SSB, and LIP. No salivary biopsy or ultrasound. She reports her ophthalmologist in [place] did a test to confirm decreased tear production (probably Schirmer test). LIP is more commonly seen in Sjogren  syndrome than SLE, and may be the cause of PAH.     #LIP: seen initially on chest CT 06/2010 with  extensive  bilateral lung abnormalities with DDX including follicular bronchiolitis / lymphocytic interstitial pneumonitis, pulmonary veno-occlusive disease, capillaryhemangiomatosis. Repeat chest CT 04/2016 shows increased thin wall l cystic esions seen within the lungs, reactive LAD, and severe cardiomegaly and PA enlargement. Concern that LIP is untreated and may be the cause of PAH. May warrant targeted immunosuppressive therapy if deemed appropriate by Pulmonology team.     #Hyperuricemia, migratory joint swelling: Uric acid as high as 11.8 with migratory joint swelling, concerning for gout but no arthrocentesis. Currently has synovitis of the R hand 5th PIP and cyst on anterior R shoulder which may be a synovial cyst. No tophi. Migratory arthralgias also raise suspicion for CPPD. XR do not show stimata of crystalline disease. Hyperuricemia may also be due to loop diuretic use and is common on PAH.     Recommendations:  - Follow up [facility] comprehensive scleroderma panel (misc lab send-out test)  - Assessment by Pulmonology of LIP status and treatment with steroids and/or other immunosuppresive agents (RTX, tacrolimus, abatacept)  - Okay to continue febuxostat if LFTs permit    Thank you for allowing [place] to participate in the care of this patient.  Please do not hesitate to call with any questions.  Patient seen and discussed with Dr. Francee Piccolo, MD  Rheumatology Fellow

## 2016-06-19 NOTE — Progress Notes (Signed)
PULMONARY VASCULAR PROGRESS NOTE     06/19/16     Current Hospital Stay:   23 days - Admitted on: 05/27/2016    Subjective:  Feeling well.  Has had slight increase in lower ext swelling overnight but notes overall weight loss still.  Has been ambulating.  Husband went back to [place] last night.  She denies any nausea/vomiting/headaches.    Objective:  - Lasix increased to 120mg  TID  - continued metolazone 5mg  daily    Current Medications:   digoxin  250 mcg QPM    docusate sodium  250 mg Daily    enoxaparin  40 mg Daily    febuxostat  40 mg Daily    ferrous sulfate  578 mg Daily    folic acid  0.5 mg Daily    furosemide  120 mg TID    medroxyPROGESTERone  1.25 mg Daily    metolazone  5 mg Daily    potassium chloride  40 mEq Daily    predniSONE  40 mg Daily    Prostacyclin Cassette Change   Daily    Prostacyclin Ice Pack   Q6H    Prostacyclin Tubing   Once per day on Mon Wed Fri    Prostacylin Batteries   Once per day on Mon    sildenafil  80 mg TID    sodium chloride (PF)  3 mL Q8H    spironolactone  50 mg Daily    vitamin b-12  500 mcg Daily      epoprostenol (FLOLAN) infusion 35 ng/kg/min (06/18/16 2000)    sodium chloride        acetaminophen  650 mg Q4H PRN    nalOXone  0.1 mg Q2 Min PRN    ondansetron  4 mg Q6H PRN    saliva substitute  5 mL PRN    sodium chloride (PF)  3 mL PRN    sodium chloride   Continuous PRN       Review of Systems:   Nutrition: Diet Custom: 2 Gram Sodium     Vital Signs:  Temperature:  [96.4 F (35.8 C)-98.7 F (37.1 C)] 98.7 F (37.1 C) (06/07 0800)  Blood pressure (BP): (128-138)/(53-69) 137/53 (06/07 0800)  Heart Rate:  [86-99] 86 (06/07 0800)  Respirations:  [20-22] 21 (06/07 0800)  Pain Score: 0 (06/07 0839)  O2 Device: Nasal cannula (06/07 0839)  O2 Flow Rate (L/min):  [3 l/min] 3 l/min (06/07 0839)  SpO2:  [93 %-99 %] 99 % (06/07 0800)  RASS Score: Alert and calm    Wt Readings from Last 1 Encounters:   06/19/16 73 kg (160 lb 15 oz)     Intake/Output  (Current Shift):  06/06 0600 - 06/07 0559  In: 4696 [P.O.:1504; I.V.:3]  Out: 4250 [Urine:4250]    Physical Exam:  General:  NAD, sitting upright in bed  HEENT:  Mucus membranes dry  Neck:  JVP ~ 9cm  Lungs:  Bibasilar crackles, otherwise clear to auscultation  Chest: tunneled line in left chest, c/d/i  CV:  RRR, S1, prominent S2, faint murmur at upper sternal border  Abdomen:  Soft, nontender, mildly distended  GU:  No foley  Extremities:  1+ pitting edema bilaterally, warm  Skin: diffuse flushing throughout, petechial rash bilateral medial thighs  Neuro:  Alert, oriented    Diagnostic Data:  Laboratory data:   Lab Results   Component Value Date    K 3.5 06/19/2016    CL 95 (L) 06/19/2016  BICARB 36 (H) 06/19/2016    BUN 86 (H) 06/19/2016    CREAT 1.24 (H) 06/19/2016    GLU 80 06/19/2016    [place] 10.4 06/19/2016     X ray shoulder 06/18/16  IMPRESSION:  No evidence of crystalline arthropathy or erosive disease. No acute abnormalities. 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    X ray wrists 06/18/16  IMPRESSION:  No evidence of crystalline arthropathy or erosive disease. No acute abnormalities. 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 She has been on triple therapy including flolan although opsumit stopped. Now doing well with steroids and diuresis.       # WHO Group I PAH with RV Overload:  RHC 5/15 with PAP 92/37/62, PAOP 12, tdPVR 9.28 WU and normal CO/CI. Currently on flolan 35ng/kg/min.    - continue Lasix 120mg  PO TID today  - Metolazone 5mg  daily, will likely transition to MWF at discharge  - continue Aldactone 50mg  daily  - continue PAHtreatment with PRA with flolan (currently at 35ng/kg/min) and PDE-I with sildenafil 80 TID; opsumit stopped(continue to hold)  - follow up rheumatologic  studies (repeat DsDNA, C3, C4)  - continue prednisone 40mg  daily, plan for 1 month taper  - appreciate Rheumatology recommendations     # Acute on chronic hypoxemic respiratory failure:  Now improved, stable on 3LPM  - continue supplemental O2   - continue treatment of PAH, as above   - continue diuresis as outlined above   - continue steroids, started 5/28  - has declined sleep study    # ARF on CKD: Renal function overall stable  - completed CTX x3 days from 5/31 for suspected UTI  - continue treatment of PAH as above  - continue diuresis  - monitor sCr and UOP  - avoid nephrotoxins and renally dose medications     # Anemia/Thrombocytopenia:Overall stable. No sign of infectious process, TSH 3.49, Fe 26, Transferritin 191, ferritin 47; RBC folate >1240B12 265(5/22)  - continue oral Fe and B12  - repeat CBC in am to follow    # Lupus/Possible Sjogren's: rheumatology consulted this admission.  Have sent additional testing per their recommendations.  - continuing steroids as above  - f/u DsDNA, C3, C4  - will need to establish care with outpatient Rheum upon discharge  - [facility] scleroderma panel pending    # Gout: no acute flare  - continue uloric for now     # Arrhythmia: continue home Digoxin    # PPx:  - DVT: LMWH 40 daily; monitor renal function  - Stress Ulcer: none   - Bowels: docusate   - Prn: APAP, zofran    # Code:  - FC/FT      Code Status      Full Code - Call Code    The patient was discussed with attending Dr. [name].    Collene Schlichter MD  PCCM fellow  [phone]      Pulmonary Vascular Attending Addendum:  Patient was examined with Dr. Kalman Shan MD  Labs, imaging and tests reviewed.  Please see above note for full details and plan.    66 yo F with WHO I PAH associated with CTD admitted following RHC 5/15/18with acute on chronic right heart failure and volume overload. Also has background of CTD associated  ILD. Chest imaging with possible LIP although has had minimal changes on CT scan over numerous  years. Now with edema/anasarca that was poorly responsive to diuretics until macitentan discontinued and patient started on steroids.     -Continue prednisone 40 mgdaily to treat underlying inflammatory etiology for decompensation (LIP and ? Other organ involvement)  -Continue PO lasix with metolazone  -Continue prior home digoxin 250 mcg PO QDAY   -Continue IV epo 35ng/kg/min  -Continue sildenafil 80 mg TID  -Continue uloric for gout  -Follow thrombocytopenia    [name] M.D.  Pulmonary and Critical Care Attending  Pager [phone]

## 2016-06-19 NOTE — Plan of Care (Signed)
Problem: Fluid Volume Excess  Goal: Absence of edema; peripheral and/or pulmonary  Outcome: Progressing toward goal, anticipate improvement over: >48 hours  Pt remains on 1.5L/day fluid restriction and is compliant with this. Pt continues with diuretics (see MAR) with good UOP and I/O's were -2406 on 6/6. Edema to LE is improving. Lungs continue with bibasilar crackles.    Problem: Breathing Pattern - Ineffective  Goal: Respiratory rate, rhythm and depth return to baseline  Outcome: Goal Met  Breathing is regular and non labored, chest expansion is symmetrical, 02 sats remain > 92% on 3L 02 via NC. Pt ambulated 4 laps around unit on 4L 02 via NC and denies DOE upon return to her room. Will continue to monitor and assess.    Problem: Tissue Perfusion, Cardiopulmonary - Altered  Goal: Hemodynamic stability  Outcome: Progressing toward goal, anticipate improvement over: >48 hours  Pt A&O, VSS, NSR with 1st degree AVB, pulses palpable, denies CP, DOE. Remains on Flolan at 72ml/24H. Pt continues with edema to LE bilaterally and abdomen, continues on diuretics with good UOP.    Problem: Falls, Risk of  Goal: Keep patient free from falls utilizing universal fall precautions  Outcome: Goal Met  Pt is free of falls and injury and is a high fall risk. Bed in low position, wheels locked, refuses bed alarm and calls for assistance prior to exiting bed; yellow anti skid socks and yellow charm utilized, call button within reach, and environmental safety check is performed with each hourly rounding. Will continue to monitor and assess.

## 2016-06-20 DIAGNOSIS — M3500 Sicca syndrome, unspecified: Secondary | ICD-10-CM

## 2016-06-20 DIAGNOSIS — M109 Gout, unspecified: Secondary | ICD-10-CM

## 2016-06-20 LAB — MDIFF
Number of Cells Counted: 115
Plt Est: DECREASED

## 2016-06-20 LAB — CBC WITH DIFF, BLOOD
ANC-Manual Mode: 5.6 10*3/uL (ref 1.6–7.0)
Abs Eosinophils: 0.2 10*3/uL (ref 0.1–0.5)
Abs Lymphs: 1.5 10*3/uL (ref 0.8–3.1)
Abs Monos: 0.3 10*3/uL (ref 0.2–0.8)
Eosinophils: 2 %
Hct: 34.9 % (ref 34.0–45.0)
Hgb: 10.7 gm/dL — ABNORMAL LOW (ref 11.2–15.7)
Lymphocytes: 20 %
MCH: 25 pg — ABNORMAL LOW (ref 26.0–32.0)
MCHC: 30.7 g/dL — ABNORMAL LOW (ref 32.0–36.0)
MCV: 81.5 um3 (ref 79.0–95.0)
Monocytes: 4 %
Plt Count: 45 10*3/uL — ABNORMAL LOW (ref 140–370)
RBC: 4.28 10*6/uL (ref 3.90–5.20)
RDW: 18.7 % — ABNORMAL HIGH (ref 12.0–14.0)
Segs: 74 %
WBC: 7.5 10*3/uL (ref 4.0–10.0)

## 2016-06-20 LAB — BASIC METABOLIC PANEL, BLOOD
Anion Gap: 12 mmol/L (ref 7–15)
BUN: 87 mg/dL — ABNORMAL HIGH (ref 8–23)
Bicarbonate: 34 mmol/L — ABNORMAL HIGH (ref 22–29)
Calcium: 10 mg/dL (ref 8.5–10.6)
Chloride: 90 mmol/L — ABNORMAL LOW (ref 98–107)
Creatinine: 1.24 mg/dL — ABNORMAL HIGH (ref 0.51–0.95)
GFR: 43 mL/min
Glucose: 77 mg/dL (ref 70–99)
Potassium: 3.7 mmol/L (ref 3.5–5.1)
Sodium: 136 mmol/L (ref 136–145)

## 2016-06-20 LAB — G6PD, BLOOD: G6PD: NORMAL

## 2016-06-20 LAB — MAGNESIUM, BLOOD: Magnesium: 2.3 mg/dL (ref 1.6–2.4)

## 2016-06-20 LAB — PHOSPHORUS, BLOOD: Phosphorous: 3.6 mg/dL (ref 2.7–4.5)

## 2016-06-20 MED ORDER — DAPSONE 100 MG OR TABS
100.0000 mg | ORAL_TABLET | Freq: Every day | ORAL | Status: DC
Start: 2016-06-21 — End: 2016-06-21
  Administered 2016-06-21: 100 mg via ORAL
  Filled 2016-06-20: qty 1

## 2016-06-20 NOTE — Plan of Care (Signed)
Problem: Fluid Volume Excess  Goal: Absence of edema; peripheral and/or pulmonary  Outcome: Progressing toward goal, anticipate improvement over: >48 hours  BLE edema noted.  Receiving diuretics as ordered and remains on 1.5 fluid restriction. See I&O flow sheet.    Problem: Breathing Pattern - Ineffective  Goal: Respiratory rate, rhythm and depth return to baseline  Outcome: Goal Met  Monitored breathing and lung sounds. Patient instructed to report difficulty breathing/shortness of breath. Patient reported no dyspnea so far this shift. See assessment for pulmonary details.  No acute respiratory distress noted.      Monitored Flolan administration. Additional CADD pump at bedside at all times. Flolan infusing without interruption tosubclavian Hickman. See MAR for Flolan concetration and rate of infusion.  Flushing noted to skin but no other side effects noted at this time.     Problem: Tissue Perfusion, Cardiopulmonary - Altered  Goal: Hemodynamic stability  Outcome: Goal Met  SR w/1st degree block. Peripheral pulses palpable. Patient denies chest pain, pressure and palpitations.     Problem: Falls, Risk of  Goal: Keep patient free from falls utilizing universal fall precautions  Outcome: Goal Met  Assessed and monitored patients safety, bedrails up x2 while in bed, bed in lowest and locked position, call light and belongings within reach, non-skid footwear on, instructed patient  to call for assistance.  No incident of fall or injury noted.

## 2016-06-20 NOTE — Progress Notes (Signed)
PULMONARY VASCULAR PROGRESS NOTE     06/20/16     Current Hospital Stay:   24 days - Admitted on: 05/27/2016    Subjective:  No issues overnight.  Has continued to urinate a lot and lose weight. No changes to her breathing.  Feels that the genital itching and dryness is slightly better today.   There is no significant discharge.    Objective:    Current Medications:   digoxin  250 mcg QPM    docusate sodium  250 mg Daily    febuxostat  40 mg Daily    ferrous sulfate  161 mg Daily    folic acid  0.5 mg Daily    furosemide  120 mg TID    medroxyPROGESTERone  1.25 mg Daily    metolazone  5 mg Daily    potassium chloride  40 mEq Daily    predniSONE  40 mg Daily    Prostacyclin Cassette Change   Daily    Prostacyclin Ice Pack   Q6H    Prostacyclin Tubing   Once per day on Mon Wed Fri    Prostacylin Batteries   Once per day on Mon    sildenafil  80 mg TID    sodium chloride (PF)  3 mL Q8H    spironolactone  50 mg Daily    vitamin b-12  500 mcg Daily      epoprostenol (FLOLAN) infusion 35 ng/kg/min (06/19/16 2000)    sodium chloride        acetaminophen  650 mg Q4H PRN    nalOXone  0.1 mg Q2 Min PRN    ondansetron  4 mg Q6H PRN    saliva substitute  5 mL PRN    sodium chloride (PF)  3 mL PRN    sodium chloride   Continuous PRN     Review of Systems:   Nutrition: Diet Custom: 2 Gram Sodium     Vital Signs:  Temperature:  [97.8 F (36.6 C)-98.6 F (37 C)] 97.8 F (36.6 C) (06/08 0960)  Blood pressure (BP): (123-135)/(53-69) 131/53 (06/08 0822)  Heart Rate:  [81-92] 84 (06/08 0822)  Respirations:  [16-21] 18 (06/08 0822)  Pain Score: 0 (06/08 0822)  O2 Device: Nasal cannula (06/08 0822)  O2 Flow Rate (L/min):  [3 l/min] 3 l/min (06/08 0822)  SpO2:  [95 %-97 %] 95 % (06/08 0822)  RASS Score: Alert and calm    Wt Readings from Last 1 Encounters:   06/20/16 69.8 kg (153 lb 14.1 oz)     Intake/Output (Current Shift):  06/07 0600 - 06/08 0559  In: 1000 [P.O.:1000]  Out: 4700 [Urine:4700]    Physical  Exam:  General:  NAD, sitting upright in chair  HEENT:  Mucus membranes dry  Neck:  JVP ~ 9cm  Lungs:  Faint basilar crackles, otherwise clear to auscultation  Chest: tunneled line in left chest, c/d/i  CV:  RRR, S1, II/VI systolic murmur best heard at bilateral upper sternal borders, no r/g  Abdomen:  Soft, nontender, mildly distended  GU:  No foley  Extremities:  1+ pitting edema bilaterally L slightly greater than right, warm  Skin: diffuse flushing throughout, petechial rash bilateral medial thighs  Neuro:  Alert, oriented    Diagnostic Data:  Laboratory data:   Lab Results   Component Value Date    K 3.7 06/20/2016    CL 90 (L) 06/20/2016    BICARB 34 (H) 06/20/2016    BUN 87 (H) 06/20/2016  CREAT 1.24 (H) 06/20/2016    GLU 77 06/20/2016    [place] 10.0 06/20/2016     X ray shoulder 06/18/16  IMPRESSION:  No evidence of crystalline arthropathy or erosive disease. No acute abnormalities. 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    X ray wrists 06/18/16  IMPRESSION:  No evidence of crystalline arthropathy or erosive disease. No acute abnormalities. 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    Assessment and Plan:  Laura-Lee Villegas is a 82 year [place] old female with SLE/Sjogren's and WHO Group I PAH who was admitted 5/15 after RHC was concerning for worsening hemodynamics. She has been on triple therapy including flolan. Now doing well with steroids and diuresis.       # WHO Group I PAH with RV Overload:  RHC 5/15 with PAP 92/37/62, PAOP 12, tdPVR 9.28 WU and normal CO/CI. Currently on flolan 35ng/kg/min.    - continue Lasix 120mg  PO TID   - Metolazone 5mg  daily, will likely transition to MWF at discharge  - continue Aldactone 50mg  daily  - continue PAHtreatment with PRA with flolan (currently at 35ng/kg/min) and PDE-I with sildenafil 80 TID; opsumit stopped(continue to hold 2/2 anemia)  - continue prednisone 40mg  daily, plan for 1 month taper  - appreciate Rheumatology  recommendations     # Acute on chronic hypoxemic respiratory failure:  Now improved, stable on 3LPM    # ARF on CKD: Renal function overall stable  - completed CTX x3 days from 5/31 for suspected UTI  - continue treatment of PAH as above  - continue diuresis  - monitor sCr and UOP  - avoid nephrotoxins and renally dose medications     # Anemia/Thrombocytopenia:Overall stable. No sign of infectious process, TSH 3.49, Fe 26, Transferritin 191, ferritin 47; RBC folate >1240B12 265(5/22)  - continue oral Fe and B12  - repeat CBC showing slightly lower platelets, will hold Lovenox for now.  Plan to repeat as outpatient, no signs of bleeding.     # Lupus/Possible Sjogren's: rheumatology consulted this admission.  Have sent additional testing per their recommendations.  - continuing steroids as above  - will need to establish care with outpatient Rheum upon discharge  - [facility] scleroderma panel pending    # Gout: no acute flare  - continue uloric for now     # Arrhythmia: continue home Digoxin    # PPx:  - DVT: hold Lovenox given worsened thrombocytopenia  - Stress Ulcer: none   - Bowels: docusate   - Prn: APAP, zofran    # Code:  - FC/FT      Code Status      Full Code - Call Code    The patient was discussed with attending Dr. [name].    Collene Schlichter MD  PCCM fellow  [phone]    Pulmonary Vascular Attending Addendum:  Patient was examined with Dr. Kalman Shan MD  Labs, imaging and tests reviewed.  Please see above note for full details and plan.    66 yo F with WHO I PAH associated with CTD admitted following RHC 5/15/18with acute on chronic right heart failure and volume overload. Also has background of CTD associated ILD. Chest imaging with possible LIP although has had minimal changes on CT scan over numerous years. Now with edema/anasarca that was poorly responsive to diuretics until macitentan discontinued and patient started on steroids.     -Continue prednisone 40 mgdaily to treat underlying  inflammatory etiology for decompensation (LIP and ?  Other organ involvement).  Plan for month long taper - decrease by 10 mg every week.  -Continue PO lasix with metolazone (will rx three times weekly metolazone on d/c home)  -Continue prior home digoxin 250 mcg PO QDAY   -Continue IV epo 35ng/kg/min  -Continue sildenafil 80 mg TID  -Continue uloric for gout  -Follow thrombocytopenia    [name] M.D.  Pulmonary and Critical Care Attending  Pager [phone]

## 2016-06-20 NOTE — Progress Notes (Signed)
Rheumatology Progress Note    Requesting provider: Dr. [name]  Date of admission: 05/27/16  Date of initial consult: 06/17/16      Diana Chambers is a 66 year old female with a history of SLE, Sjogren syndrome, LIP, PAH, and migratory joint swelling who was admitted 05/27/16 for RHC and fluid overlap. Rheumatology consulted for management of SLE and contribution of SLE to [name].     Interim Hx:   Feeling well. Denies SOB, chest pain, diaphoresis. Thinks her leg swelling is decreasing. She agrees to have a diagnostic salivary gland ultrasound.     HPI: initial consult on 06/17/16  Several years ago she had an instance of eye irritation and pain while traveling, saw an ophthalmologist who did tests and discovered she had +SSA and referred her to a rheumatologist. She was diagnosed with SLE on a basis of + ANA 1:640, SSA, SSB, dsDNA > 200, and was being followed by Dr. Joaquin Bend until 2015. She was told that despite having elevated antibody titers, her SLE was clinically quiescent and she was never put on medical therapy. She also has pulmonary HTN, diagnosed 17 years ago before she receiving a diagnosis of SLE. CT showed lymphocytic interstitial pneumonitis (LIP). She has been on Flolan IV for PAH, and has received intermittent prednisone bursts. She has been having progressive SOB and peripheral edema, and was admitted 05/27/16 for RHC. During this admission, she has received diuresis and diagnosed with MSSA UTI treated with ceftriaxone x 3 days. She was also given prednisone 40 mg bid starting 06/16/16, and reports that she had difficulty urinating until she was given prednisone and a Foley catheter was inserted (immediately had 350 cc urine output). She feels that she is retaining water less now. At this time, she reports SOB, dry mouth, and dry eyes. She has facial flushing while on Flolan. Denies chest pain, joint pain, oral ulcers, rashes, alopecia, Raynaud's phenomenon, muscle weakness, abdominal pain, N/V/C/D, and  photosensitivity.       ROS:   Denies fevers, chills, weight loss, oral ulcers, alopecia, skin rash or thickening, Raynaud's phenomenon, dysphagia, reflux symptoms, diarrhea, abdominal pain, nausea, or vomiting, hematochezia, hematuria, or hemoptysis.     14-pt ROS reviewed and otherwise negative.    PMH:  SLE  Sjogren Syndrome  CKD  PAH  Gout?    Past Medical History:   Diagnosis Date    Pulmonary HTN     Sjoegren syndrome (CMS-HCC) 07/12/2009       Past Surgical Hx:  No past surgical history on file.    Current Meds:    Current Facility-Administered Medications:     acetaminophen (TYLENOL) tablet 650 mg, 650 mg, Oral, Q4H PRN, Marcy Siren, MD, 650 mg at 06/04/16 2774    digoxin (LANOXIN) tablet 250 mcg, 250 mcg, Oral, QPM, Larna Daughters., MD, 250 mcg at 06/19/16 1711    docusate sodium (COLACE) capsule 250 mg, 250 mg, Oral, Daily, Larna Daughters., MD, 250 mg at 06/20/16 0825    epoprostenol (FLOLAN) 60,000 ng/mL in glycine diluent 100 mL infusion, 35 ng/kg/min (Order-Specific), IntraVENOUS, Continuous, Poch, Camelia Eng., MD, Last Rate: 3.01 mL/hr at 06/20/16 1349, 35 ng/kg/min at 06/20/16 1349    febuxostat (ULORIC) tablet 40 mg, 40 mg, Oral, Daily, Light, Clance Boll, MD, 40 mg at 06/20/16 1287    ferrous sulfate EC tablet 324 mg, 324 mg, Oral, Daily, Marcy Siren, MD, 324 mg at 86/76/72 0947    folic acid (FOLVITE) tablet 0.5 mg,  0.5 mg, Oral, Daily, Light, Clance Boll, MD, 0.5 mg at 06/20/16 0825    furosemide (LASIX) tablet 120 mg, 120 mg, Oral, TID, Collene Schlichter, MD, 120 mg at 06/20/16 1220    medroxyPROGESTERone (PROVERA) tablet 1.25 mg, 1.25 mg, Oral, Daily, Light, Clance Boll, MD, 1.25 mg at 06/20/16 2956    metolazone (ZAROXOLYN) tablet 5 mg, 5 mg, Oral, Daily, Larna Daughters., MD, 5 mg at 06/20/16 0825    nalOXone [facility]) injection 0.1 mg, 0.1 mg, IntraVENOUS, Q2 Min PRN, Light, Clance Boll, MD    ondansetron [facility]) injection 4 mg, 4 mg, IntraVENOUS, Q6H PRN,  Alfonse Ras, MD    potassium chloride (KLOR-CON) ER tablet 40 mEq, 40 mEq, Oral, Daily, Larna Daughters., MD, 40 mEq at 06/20/16 0825    predniSONE (DELTASONE) tablet 40 mg, 40 mg, Oral, Daily, Majel Homer., MD, 40 mg at 06/20/16 0825    Prostacyclin Cassette Change, , Other, Daily, Poch, Camelia Eng., MD, 1 each at 06/20/16 1400    Prostacyclin Ice Pack, , Other, Q6H, Poch, Camelia Eng., MD    Prostacyclin Tubing, , Other, Once per day on Mon Wed Fri, Poch, David S., MD, 1 each at 06/20/16 1400    Prostacylin Batteries, , Other, Once per day on Mon, Poch, David S., MD    saliva substitute (CAPHOSOL) solution 5 mL, 5 mL, Oral, PRN, Collene Schlichter, MD, 5 mL at 06/19/16 2022    sildenafil (REVATIO, VIAGRA) tablet 80 mg, 80 mg, Oral, TID, Rolm Gala, PHARMD, 80 mg at 06/20/16 1227    sodium chloride (PF) 0.9 % flush 3 mL, 3 mL, IntraVENOUS, Q8H, Light, Clance Boll, MD, 3 mL at 06/19/16 1358    sodium chloride (PF) 0.9 % flush 3 mL, 3 mL, IntraVENOUS, PRN, Marcy Siren, MD, 3 mL at 05/30/16 2130    sodium chloride 0.9 % TKO infusion, , IntraVENOUS, Continuous PRN, Light, Clance Boll, MD    spironolactone (ALDACTONE) tablet 50 mg, 50 mg, Oral, Daily, Light, Clance Boll, MD, 50 mg at 06/20/16 8657    vitamin b-12 (CYANOCOBALAMIN) tablet 500 mcg, 500 mcg, Oral, Daily, Alfonse Ras, MD, 500 mcg at 06/20/16 8469    All:  Allergies   Allergen Reactions    Allopurinol Hives    Levaquin Swelling and Other     Severe joint pain      Ofloxacin Nausea and Vomiting    Chlorhexidine Itching    Sulfa Drugs Unspecified       Soc Hx:  Social History     Social History    Marital status: Married     Spouse name: N/A    Number of children: N/A    Years of education: N/A     Occupational History    Not on file.     Social History Main Topics    Smoking status: Former Smoker     Packs/day: 0.50     Years: 5.00     Quit date: 01/13/1978    Smokeless tobacco: Never Used     Alcohol use No    Drug use: Not on file    Sexual activity: Not on file     Other Topics Concern    Not on file     Social History Narrative       Fam Hx:  Family History   Problem Relation Age of Onset    Heart Disease Mother     Hypertension Mother     Psychiatry Mother  Heart Disease Father        Physical Exam:  Vitals:    06/20/16 0900 06/20/16 1108 06/20/16 1200 06/20/16 1520   BP:  133/59 131/88 126/61   BP Location:  Right arm  Right arm   BP Patient Position:  Sitting  Semi-Fowlers   Pulse:  86 82 85   Resp:  20  20   Temp:  98.1 F (36.7 C)  98.6 F (37 C)   SpO2: 95% 98%  98%   Weight:       Height:         Gen: well-appearing, NAD  HEENT: PERRL, no conjunctival injection,  moist mucous membranes, no oral ulcers  Neck: neck supple, no LAD  CV: RRR, normal S1/S2, no M/R/G  Pulm: CTAB, non-labored breathing, no C/W/R, on 4 LNC, decreased breath sounds at bases  Abd: soft, ND, NT, BS present, no organomegaly  Extremities: 3+ bilateral pitting peripheral edema. No cyanosis, digital ulceration noted  Skin: Flushed skin all face and body. No malar rash, ulcers, ecchymoses, livedo reticularis, nodules, tophi, or nail pitting   Neuro: alert and oriented, full strength 5/5 in UE and LE bilaterally, sensation to touch grossly intact, DTR's 2+ and symmetric, neg Babinski, fine motor coordination intact    MSK:   No muscle tenderness, atrophy, or abnormal movements noted  Hands:    R hand - swelling and mild tenderness in 5th PIP  Wrists: no swelling or tenderness, full ROM bilaterally  Elbows: no nodules or tophi, no swelling or tenderness, full ROM bilaterally without contracture  Shoulders: Fluctuant cyst on anterior shoulder, c/w synovial cyst.  No SC, GH, or AC joint tenderness or swelling. Full ROM bilaterally  Neck: full ROM of C-spine, no tenderness  Hips: full ROM B/L, no tenderness  Knees: No swelling or tenderness bilaterally, cool to touch, full ROM bilaterally, contracture, or laxity  Ankles:  No swelling or tenderness bilaterally. Full ROM bilaterally  Feet: No tenderness or swelling at MTPs or IPs of feet, no deformities      Labs:  Lab Results   Component Value Date    WBC 7.5 06/20/2016    RBC 4.28 06/20/2016    HGB 10.7 (L) 06/20/2016    HCT 34.9 06/20/2016    MCV 81.5 06/20/2016    MCHC 30.7 (L) 06/20/2016    RDW 18.7 (H) 06/20/2016    PLT 45 (L) 06/20/2016    PLT 179 09/12/2008    MPV 12.4 06/12/2016     Lab Results   Component Value Date    NA 136 06/20/2016    K 3.7 06/20/2016    CL 90 (L) 06/20/2016    BICARB 34 (H) 06/20/2016    BUN 87 (H) 06/20/2016    CREAT 1.24 (H) 06/20/2016    GLU 77 06/20/2016    [place] 10.0 06/20/2016     Lab Results   Component Value Date    AST 21 06/10/2016    ALT 15 06/10/2016    ALK 155 (H) 06/10/2016    TP 7.4 06/10/2016    ALB 3.7 06/10/2016    TBILI 0.35 06/10/2016    DBILI <0.2 06/07/2016     Lab Results   Component Value Date    COLORUA Straw 06/18/2016    APPEARUA Clear 06/18/2016    GLUCOSEUA Negative 06/18/2016    BILIUA Negative 06/18/2016    KETONEUA Negative 06/18/2016    SGUA 1.008 06/18/2016    BLOODUA Negative 06/18/2016  PHUA 6.0 06/18/2016    PROTEINUA Negative 06/18/2016    UROBILUA Negative 06/18/2016    NITRITEUA Negative 06/18/2016    LEUKESTUA Negative 06/18/2016    WBCUA 0-2 06/18/2016    RBCUA 0-2 06/18/2016    HYALINEUA >5 (A) 06/18/2016     Results for SUMIYA, MAMARIL (MRN [id number]) as of 06/17/2016 19:19   Ref. Range 08/11/2012 16:10 05/02/2016 20:36 05/04/2016 05:30 05/04/2016 14:45 06/08/2016 10:20   Anti-DSDNA Latest Ref Range: 0 - 24 IU >200 (H)  >200 (H)  >200 (H)   C3 Latest Ref Range: 90 - 180 mg/dL 118 119   106   C4 Latest Ref Range: 10 - 40 mg/dL _0 Cardiolipin Ab, IgA Latest Ref Range: 0 - 11 APL    0    Cardiolipin IgG, Ab Latest Ref Range: 0 - 14 GPL    <10    Cardiolipin IgM, Ab Latest Ref Range: 0 - 12 MPL    0    Jo-1 Ab, IgG Latest Ref Range: 0 - 40 AU/mL    0    RNP Latest Ref Range: 0.0 - 4.9 U/mL    2.4    SCL-70  Ab Latest Ref Range: 0 - 40 AU/mL    2    Smith Ab Latest Ref Range: 0.0 - 6.9 U/mL    3.6    SSA Ab Latest Ref Range: 0.0 - 6.9 U/mL    >240.0 (H)    SSB Ab Latest Ref Range: 0.0 - 6.9 U/mL    58.0 (H)      Results for GENEEN, DIETER (MRN [id number]) as of 06/17/2016 19:19   Ref. Range 04/17/2003 13:45 12/21/2006 13:30 12/24/2009 13:45 05/06/2016 05:41 05/15/2016 17:02 05/16/2016 06:07 06/02/2016 05:15   Uric Acid Latest Ref Range: 2.4 - 5.7 mg/dL 9.4 (H) 10.2 (H) 10.3 (H) 11.8 (H) 8.7 (H) 8.6 (H) 8.4 (H)     CXR 06/05/16  1. Stable position of the tunneled left transjugular central venous catheter terminating mid right atrium.  2. Grossly enlarged cardiac silhouette, marked dilation of the main and hilar pulmonary arteries. Query trace interstitial pulmonary edema.  3. No acute pulmonary consolidation. Minimal atelectasis right lung base.  4. Trace bilateral pleural effusions with no pneumothorax.  5. Severe pulmonary hypertension, secondary right heart, pulmonary artery enlargement.      Chest CT 05/2009  No pulmonary embolism.  Enlargement of the main, right, and left pulmonary arteries along with right heart dilatation, related to pulmonary hypertension, similar to the prior study on 10/10/2006.    Chest CT  06/2010  Redemonstrated evidence of pulmonary hypertension. Also relatively unchanged but extensive bilateral lung abnormalities, some of which may be seen with pulmonary hypertension. However, follicular bronchiolitis / lymphocytic interstitial pneumonitis is in the differential. If the pulmonary   hypertension is unexplained, then pulmonary veno-occlusive disease / capillaryhemangiomatosis may be considered.    Chest CT 05/04/16  1. Severe enlargement of the pulmonary artery with increased calcification along the pulmonary arterial walls compatible with longstanding pulmonary hypertension. Progressed parenchymal changes of the lungs compatible with pulmonary hypertension .  2. Increased thin wall l cystic esions  seen within the lungs, nonspecific be can be seen with underlying lymphocytic interstitial pneumonia, which can be seen in patients with lupus/Sjogren's disease.  3. Cardiomegaly with evidence of right heart dysfunction. Trace nonspecific pericardial fluid with pericardial thickening suspected. In a patient with known lupus underlying pericarditis/serositis may be present. Correlation with  echocardiogram is recommended.  4. Likely reactive adenopathy of the thorax.    CT scan of the abdomen and pelvis without IV contrast 06/07/16  Sequelae of volume overload, likely secondary to right heart dysfunction.  No intra-abdominal mass is identified.  Cholelithiasis without evidence of acute cholecystitis.  Findings of pulmonary arterial hypertension are better evaluated on CT thorax from 05/04/2016.  Note that assessment of solid organs is limited in the absence of intravenous contrast.    TTE 06/11/16:  LVEF 88%  1. The left ventricular size is normal and the left ventricular systolic function is normal.  2. Mild or grade I (impaired relaxation pattern) left ventricular diastolic filling.  3. The right ventricular size is severely enlarged and systolic function is reduced.  4. Moderate tricuspid regurgitation.  5. The aortic valve is mildly stenotic.  6. Severe pulmonary hypertension with right ventricular systolic pressure measuring 75 mmHg.  7. Compared to prior study no significant change.    Imaging:    XR L shoulder & bilateral wrists  06/18/16  Left wrist 2 images:  There is no fracture, dislocation or acute abnormality. There is no swelling or soft tissue calcification. There is mild first carpometacarpal osteoarthrosis. The carpus is congruent. The soft tissues appear normal.    Right wrist 2 images:  There is no fracture, dislocation or acute abnormality. There is no swelling or soft tissue calcification. There is mild/moderate first carpometacarpal osteoarthrosis and mild arthrosis at the interphalangeal  joint of the thumb. The carpus is congruent.    Left shoulder 3 images:  There is no fracture, dislocation or acute abnormality. There are no soft tissue calcifications. There is mild acromioclavicular osteoarthrosis. Left internal jugular catheter is noted. There are stigmata of pulmonary hypertension with pulmonary arterial dilatation better seen on prior chest x-ray.    IMPRESSION:  No evidence of crystalline arthropathy or erosive disease. No acute abnormalities. 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      U/S bilateral parotid and submandibular glands  06/20/16 - performed by Dr. Dianne Dun    Salivary gland ultrasound    Indication:  Evaluate for sialoadenitis and massess    Ultrsound examination was performed with GE Logiq e module equipped with high frq 8-13 MHz transducer    B-Mode ultrasonography scoring system according to [employer] al. (2005)?     Parameter Gland Score        0 _0 Parenchymal echogenicity compared with thyroid gland Parotids or submandibular Comparable Decreased -- --   Inhomogeneity Parotids or submandibular None Mild Moderate Grossly   Hypo-echogenic areas Parotids or submandibular Absent A few scattered Several Numerous   Hyper-echogenic reflections Parotids Absent A few scattered Several Numerous    Submandibular Absent Present -- --   Clearness of salivary gland borders Parotids or submandibular Clear, regular defined borders Partly defined borders Ill-defined borders Borders not visible   ?    LEFT PAROTID: decreased echogenicity, parenchyma with mild inhomogeneity, few scattered hypoechogenic areas, several hyperechogenic reflection, partly defined posterior border, no masses    LEFT SUBMANDIBULAR: decreased echogenicity, parenchyma with gross inhomogeneity, numerous hypoechogenic areas, few scattered hyperechogenic reflections, partly defied posterior border, no masses    RIGHT PAROTID: decreased echogenicity, parenchyma with mild inhomogeneity, few scattered  hypoechogenic areas, few scattered hyperechogenic reflection, ill-defined posterior border , no masses    RIGHT SUBMANDIBULAR: decreased echogenicity, parenchyma grossly inhomogenous, few scattered hypoechogenic areas, few scattered hyperechogenic reflection, posterior border not visible, no masses  Total score: 33    Each parotid and each submandibular gland was graded separately with respect to the five parameters listed. A global score was calculated summing all individual ratings. Total range of score: 0-48    Setting the cutoff score for [place] at 19 resulted in the best ratio of specificity (90.8%) to sensitivity (79.7%) (Milic at al. J Rheumatol, 36 (2009), pp. 2820-6015)      Impressions:  Findings consistent with sialaniditis     Key images available in [employer]      Assessment:  Diana Chambers is a 66 year old  history of SLE, Sjogren syndrome, CKD, LIP, PAH, and migratory joint swelling who was admitted 05/27/16 for RHC and fluid overlap. Rheumatology consulted for management of SLE and contribution of SLE to [facility].      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 On review of Dr. Ermalinda Barrios notes from 2013-2015, she did not have any SLE symptoms or clinical findings but only serologic tests suggesting SLE. She was told that despite having elevated antibody titers, her SLE was clinically quiescent and she was never put on medical therapy. It would difficult to make a case that her SLE/CTD is active now and the cause of PAH. Renal function normal with bland urine sediment, no evidence of renal dysfunction. As an outpatient, adding HCQ might be of some benefit if she agreeable to it but unlikely to have major disease-modifying effect on PAH or LIP.     #Sjogren syndrome: clinical features that support this diagnosis include sicca symptoms of the eyes and mouth, +SSA, +SSB, and LIP. She reports her ophthalmologist in [place] did a test to confirm decreased tear  production (probably Schirmer test) and punctal occlusion. Ultrasound bilateral parotid and submandibular glands with findings of sialadenitis, supporting diagnosis of Sjogren syndrome.   nd . LIP is more commonly seen in Sjogren syndrome than SLE, and may be the cause of PAH.     #LIP: seen initially on chest CT 06/2010 with  extensive bilateral lung abnormalities with DDX including follicular bronchiolitis / lymphocytic interstitial pneumonitis, pulmonary veno-occlusive disease, capillaryhemangiomatosis. Repeat chest CT 04/2016 shows increased thin wall l cystic esions seen within the lungs, reactive LAD, and severe cardiomegaly and PA enlargement. Concern that LIP is untreated and may be the cause of PAH. May warrant targeted immunosuppressive therapy if deemed appropriate by Pulmonology team.     #Hyperuricemia, migratory joint swelling: Uric acid as high as 11.8 with migratory joint swelling, concerning for gout but no arthrocentesis. Currently has synovitis of the R hand 5th PIP and cyst on anterior R shoulder which may be a synovial cyst. No tophi. Migratory arthralgias also raise suspicion for CPPD. XR do not show stimata of crystalline disease. Hyperuricemia may also be due to loop diuretic use and is common on PAH.      Recommendations:  - Follow up [facility] comprehensive scleroderma panel (misc lab send-out test)  - Assessment by Pulmonology of LIP status and treatment with steroids and/or other immunosuppresive agents (RTX, MMF, tacrolimus, abatacept)  - Okay to continue febuxostat if LFTs permit  - Follow up with Dr. Dianne Dun as outpatient 4 weeks after follow up    Thank you for allowing [place] to participate in the care of this patient.  Please do not hesitate to call with any questions.  Patient seen and discussed with Dr. Francee Piccolo, MD  Rheumatology Fellow

## 2016-06-20 NOTE — Discharge Instructions (Signed)
Your new diuretic regimen is as follows:  Lasix 120mg  three times daily  Metolazone 5mg  Monday, Wednesday, Friday    You will be continuing Prednisone on a taper as follows:  Take 40mg  (4 tablets) on Sunday when you get home  On 6/11 take 30mg  daily for one week  On 6/18 take 20mg  daily for one week  On 6/25 take 10mg  daily for one week (or until you see Dr. Bettey Chambers).    While on Prednisone, you are being prescribed a new medication called Dapsone which is to be taken once daily to prevent infection.      Please contact the pulmonary hypertension office if you have any questions regarding your medications or health.  Diana Chambers will arrange for you to have your labs checked in about two weeks.

## 2016-06-20 NOTE — Plan of Care (Signed)
Problem: Fluid Volume Excess  Goal: Absence of edema; peripheral and/or pulmonary  Outcome: Progressing toward goal, anticipate improvement over: Next 24-48 hours  Pt continues to be aggressively diuresed with oral lasix TID.  Pt with +2 edema to ankles/feet.  Pt on 1500 fluid restriction and has been compliant.  Pt voiding adequate large amounts.  Daily weights being monitored.    Problem: Breathing Pattern - Ineffective  Goal: Respiratory rate, rhythm and depth return to baseline  Outcome: Goal Met  Pt maintaining 02 sats >92% on 3 L NC and has not needed additional 02.  Pt with RR WNL and breathing unlabored at rest or with activity.      Problem: Tissue Perfusion, Cardiopulmonary - Altered  Goal: Hemodynamic stability  Outcome: Goal Met  Vitals stable.  Pt in NSR with occasional ectopy on continuous telemetry.  Palpable pulses with brisk cap refill.  Pt denies any CP/palpitations or dizzyness.  Will notify MD with any significant cardiac changes.    Problem: Falls, Risk of  Goal: Keep patient free from falls utilizing universal fall precautions  Outcome: Goal Met  Pt with call bell and personal belongings in reach, side rails x2 and bed locked in lowest position.  Non-skid socks in place with yellow arm band,  Pt calls appropriately when needs assist for getting OOB/ambulation.

## 2016-06-21 ENCOUNTER — Other Ambulatory Visit (HOSPITAL_BASED_OUTPATIENT_CLINIC_OR_DEPARTMENT_OTHER): Payer: Self-pay | Admitting: Pulmonary Disease

## 2016-06-21 LAB — BASIC METABOLIC PANEL, BLOOD
Anion Gap: 14 mmol/L (ref 7–15)
BUN: 89 mg/dL — ABNORMAL HIGH (ref 8–23)
Bicarbonate: 33 mmol/L — ABNORMAL HIGH (ref 22–29)
Calcium: 10 mg/dL (ref 8.5–10.6)
Chloride: 91 mmol/L — ABNORMAL LOW (ref 98–107)
Creatinine: 1.33 mg/dL — ABNORMAL HIGH (ref 0.51–0.95)
GFR: 40 mL/min
Glucose: 94 mg/dL (ref 70–99)
Potassium: 3.7 mmol/L (ref 3.5–5.1)
Sodium: 138 mmol/L (ref 136–145)

## 2016-06-21 LAB — MAGNESIUM, BLOOD: Magnesium: 2.3 mg/dL (ref 1.6–2.4)

## 2016-06-21 LAB — PHOSPHORUS, BLOOD: Phosphorous: 3.6 mg/dL (ref 2.7–4.5)

## 2016-06-21 MED ORDER — DAPSONE 100 MG OR TABS
100.00 mg | ORAL_TABLET | ORAL | 1 refills | Status: DC
Start: 2016-06-21 — End: 2017-03-09

## 2016-06-21 MED ORDER — FUROSEMIDE 40 MG OR TABS
120.0000 mg | ORAL_TABLET | Freq: Three times a day (TID) | ORAL | 3 refills | Status: DC
Start: 2016-06-21 — End: 2016-07-28

## 2016-06-21 MED ORDER — METOLAZONE 5 MG OR TABS
5.0000 mg | ORAL_TABLET | ORAL | 3 refills | Status: DC
Start: 2016-06-21 — End: 2016-07-28

## 2016-06-21 MED ORDER — PREDNISONE 10 MG OR TABS
ORAL_TABLET | ORAL | 0 refills | Status: DC
Start: 2016-06-21 — End: 2017-03-09

## 2016-06-21 MED ORDER — DIGOXIN 0.25 MG OR TABS
ORAL_TABLET | ORAL | 3 refills | Status: DC
Start: 2016-06-21 — End: 2017-03-09

## 2016-06-21 MED ORDER — FUROSEMIDE 40 MG OR TABS
120.00 mg | ORAL_TABLET | ORAL | 3 refills | Status: DC
Start: 2016-06-21 — End: 2017-03-09

## 2016-06-21 MED ORDER — FEBUXOSTAT 40 MG PO TABS
40.0000 mg | ORAL_TABLET | Freq: Every day | ORAL | 3 refills | Status: DC
Start: 2016-06-21 — End: 2016-07-28

## 2016-06-21 MED ORDER — FEBUXOSTAT 40 MG PO TABS
40.00 mg | ORAL_TABLET | ORAL | 3 refills | Status: DC
Start: 2016-06-21 — End: 2017-03-09

## 2016-06-21 MED ORDER — DAPSONE 100 MG OR TABS
100.0000 mg | ORAL_TABLET | Freq: Every day | ORAL | 1 refills | Status: DC
Start: 2016-06-21 — End: 2016-07-28

## 2016-06-21 MED ORDER — CYANOCOBALAMIN 500 MCG OR TABS
500.0000 ug | ORAL_TABLET | Freq: Every day | ORAL | 3 refills | Status: DC
Start: 2016-06-21 — End: 2017-04-01

## 2016-06-21 MED ORDER — VITAMIN B-12 500 MCG OR TABS
ORAL_TABLET | ORAL | 3 refills | Status: AC
Start: 2016-06-21 — End: 2017-06-21

## 2016-06-21 MED ORDER — DIGOXIN 0.25 MG OR TABS
250.0000 ug | ORAL_TABLET | Freq: Every evening | ORAL | 3 refills | Status: DC
Start: 2016-06-21 — End: 2016-07-28

## 2016-06-21 MED ORDER — PREDNISONE 10 MG OR TABS
40.0000 mg | ORAL_TABLET | Freq: Every day | ORAL | 0 refills | Status: DC
Start: 2016-06-21 — End: 2016-07-28

## 2016-06-21 MED ORDER — PNEUMOCOCCAL VAC POLYVALENT 25 MCG/0.5ML IJ INJ (CUSTOM)
0.5000 mL | INJECTION | INTRAMUSCULAR | Status: DC
Start: 2016-06-21 — End: 2016-06-21

## 2016-06-21 MED ORDER — PNEUMOCOCCAL VAC POLYVALENT 25 MCG/0.5ML IJ INJ (CUSTOM)
0.5000 mL | INJECTION | INTRAMUSCULAR | Status: AC
Start: 2016-06-21 — End: 2016-06-21
  Administered 2016-06-21: 0.5 mL via INTRAMUSCULAR
  Filled 2016-06-21: qty 0.5

## 2016-06-21 MED FILL — FEBUXOSTAT TAB 40 MG: MG | 30 days supply | Qty: 30 | Fill #0

## 2016-06-21 MED FILL — METOLAZONE TAB 5 MG: MG | 28 days supply | Qty: 12 | Fill #0

## 2016-06-21 MED FILL — DAPSONE TAB 100 MG: MG | 30 days supply | Qty: 30 | Fill #0

## 2016-06-21 MED FILL — DIGOXIN TAB 0.25 MG: MG | 30 days supply | Qty: 30 | Fill #0

## 2016-06-21 MED FILL — PREDNISONE TAB 10 MG: MG | 29 days supply | Qty: 53 | Fill #0

## 2016-06-21 MED FILL — FUROSEMIDE TAB 40 MG: MG | 28 days supply | Qty: 252 | Fill #0

## 2016-06-21 NOTE — Interdisciplinary (Signed)
Discharged pt to home with husband and CCP to curbside with wheelchair and oxygen. Pt with home O2 concentrator in personal vehicle. All d/c instructions and medication information given to pt. She voices understanding and all questions answered thoroughly. Pneumococcal vaccine given per orders, PIV removed without s/s of phlebitis or bleeding. Medications picked up at pharmacy by husband. Extra ice packs and extra Flolan cassette sent with pt for drive home. No acute distress noted, VSS, pt agrees to discharge at this time.

## 2016-06-21 NOTE — Interdisciplinary (Signed)
06/21/16 1022   Discharge Planning Needs   Planned living arrangements after discharge: Home w/spouse   Does this patient have CM discharge planning needs? No   Final Discharge Destination   Final Discharge Destination Home     D/C orders received.  Met w/pt and no d/c needs identified.  Pt admitted to hospital w/home flolan gtt supplied by [facility].  Confirmed that pt uses home O2 supplied by [employer].  Pt to be connected to home POC upon d/c.    Barriers to d/c: none.  Transportation: spouse.  DCP: Home w/no further d/c needs.  Pt, family, and care team aware and agree with d/c plan.  CM to sign-off.    Ria Clock MSN/RN/CNL/PHN  Inpatient care manager  [phone]

## 2016-06-21 NOTE — Goals of Care (Signed)
Problem: Fluid Volume Excess  Goal: Absence of edema; peripheral and/or pulmonary  Outcome: Progressing toward goal, anticipate improvement over: Next 24-48 hours  Pt continues to be aggressively diuresed with oral lasix TID.  Pt with +1 edema to ankles/feet.   Pt on 1500 fluid restriction and has been compliant.  Pt voiding adequate large amounts; voided +3L in previous 24 hours.  Daily weights being monitored.    Problem: Breathing Pattern - Ineffective  Goal: Respiratory rate, rhythm and depth return to baseline  Outcome: Goal Met  Pt maintaining 02 sats >92% on 3 L NC and has not needed additional 02.  Pt with RR WNL and breathing unlabored at rest or with activity.      Problem: Tissue Perfusion, Cardiopulmonary - Altered  Goal: Hemodynamic stability  Outcome: Goal Met  Vitals stable.  Pt in NSR with occasional ectopy on continuous telemetry.  Palpable pulses with brisk cap refill.  Pt denies any CP/palpitations or dizzyness.  Will notify MD with any significant cardiac changes.    Problem: Falls, Risk of  Goal: Keep patient free from falls utilizing universal fall precautions  Outcome: Goal Met  Pt with call bell and personal belongings in reach, side rails x2 and bed locked in lowest position.  Non-skid socks in place with yellow arm band,  Pt calls appropriately when needs assist for getting OOB/ambulation.

## 2016-06-22 NOTE — Discharge Summary (Signed)
Patient Name:  Diana Chambers    Principal Diagnosis (required):  Decompensated right heart failure    Hospital Problem List (required):  Active Hospital Problems    Diagnosis    PAH (pulmonary artery hypertension) [I27.21]      Resolved Hospital Problems    Diagnosis   No resolved problems to display.       Additional Hospital Diagnoses ("rule out" or "suspected" diagnoses, etc.):     Group 1 PAH  Urinary Tract Infection  Acute on chronic hypoxemic respiratory failure  Sjogren's Syndrome  Thrombocytopenia  Anemia  Acute Kidney Injury    Principal Procedure During This Hospitalization (required):  Echocardiography  Right Heart Catheterization    Other Procedures Performed During This Hospitalization (required):  CT imaging of abdomen/pelvis  Ultrasound of Abdomen    Procedure results are available in Chart Review in Epic.  For those providers external to [facility], the key procedure results are listed below:    CT A/P 06/07/16  IMPRESSION:  CT scan of the abdomen and pelvis without IV contrast    Sequelae of volume overload, likely secondary to right heart dysfunction.    No intra-abdominal mass is identified.    Cholelithiasis without evidence of acute cholecystitis.    Findings of pulmonary arterial hypertension are better evaluated on CT thorax from 05/04/2016.    Abdominal U/S 05/29/16  IMPRESSION:  Small amount of perihepatic ascites.    Cholelithiasis without evidence of acute cholecystitis.    Hepatosplenomegaly.    ECHO 06/11/16  Summary:  1. The left ventricular size is normal and the left ventricular systolic function is normal.  2. Mild or grade I (impaired relaxation pattern) left ventricular diastolic filling.  3. The right ventricular size is severely enlarged and systolic function is reduced.  4. Moderate tricuspid regurgitation.  5. The aortic valve is mildly stenotic.  6. Severe pulmonary hypertension with right ventricular systolic pressure measuring 75 mmHg.  7. Compared to prior study no  significant change    RHC 05/27/16    Procedure Findings:    Baseline  Units   RA [a/v (mean)] 19 mmHg   RV [Sys/Dias (RVEDP)] 93 (23) mmHg   PA [Sys/Dias (Mean)] 92/37 (62)  mmHg   PAOP [End Exp (Mean)]  18 (12) mmHg   Fick CO (CI) 4.74(2.67) L/min (L/min/m2)   TD CO (CI)  4.97(2.78) L/min (L/min/m2)   Fick PVR  9.28 WU   TD PVR  8.85 WU   PA sat%  64% %   AO sat%  97% %     Consultations Obtained During This Hospitalization:  Nephrology  Rheumatology    Key consultant recommendations:  Rheum:  - Follow up [facility] comprehensive scleroderma panel (misc lab send-out test)  - Assessment by Pulmonology of LIP status and treatment with steroids and/or other immunosuppresive agents (RTX, MMF, tacrolimus, abatacept)  - Okay to continue febuxostat if LFTs permit  - Follow up with Dr. Dianne Dun as outpatient 4 weeks after follow up    Nephrology:  # AKI on CKD 3b: She has CKD according to our prior records w/ Cr ~1.1 for the past few months. Of note, her eGFR may be falsely higher given her low body mass. Cause is most likely congestion from heart failure. Her recent fluctuation in Cr suggest this is hemodynamic-related. Regarding her recent urinalysis which was quite active, this occurred after foley placement, prior to that there was no blood and no cells. That being said, she has Sjogren's and possibly Lupus,  though the latter has never manifested itself clinically. Sjogrens can cause an interstitial nephritis. This is unlikely Lupus nephritis given her lack of consistently active urine sediment.  - We agree w SPEP  - Also send Kappa/Lambda and Immunofixation (mildly high [place], r/o paraproteinemia)  - Send for HBV Ab and IgG4  - Repeat Urine Lytes (If Na low, highly suggestive of pre-renal, if high, cannot interpret 2/2 diuretics)    Reason for Admission to the Hospital / History of Present Illness:  Diana Chambers is a pleasant 46 year oldfemale with SLE, chronic hypoxemic respiratory failure, and PAH 2/2 CTD and  prior anorexigen use.  Admitted after [place] above for diuresis and titration of her PAH medication.      Hospital Course by Problem (required):  # WHO Group I PAH with RV Overload:  RHC 5/15 with PAP 92/37/62, PAOP 12, tdPVR 9.28 WU and normal CO/CI. She was admitted for IV diuresis and titration of PAH medications.  Ultimately diuresed -39L during hospital stay and was transitioned to high dose oral Lasix with Metolazone adjunct.  She continued on Aldactone 84m daily.  Macitentan was stopped this admission when she was noted to have persistent anemia and anasarca.  Her Flolan was uptitrated to 35ng/kg/min which she tolerated well.  She was additionally continued on Sildenafil 859mTID.  Given difficulty with diuresis early on during hospital stay and concern for inflammatory/rheum component to her decompensation, she was started on Prednisone.  She was discharged with plans to taper by 1021meekly with Dapsone ppx [facility]       # Acute on chronic hypoxemic respiratory failure:  Decompensated at presentation with significant volume overload.  She was weaned to home O2 requirement 3L and was able to ambulate laps around nursing station prior to discharge without dyspnea.    # ARF on CKD: On presentation, AKI and renal was consulted for concern of lupus nephritis.  She was treated with Ceftriaxone for 3 days for UTI.  She overall improved with diuresis suggesting a cardiorenal etiology of her decompensation.     # Anemia/Thrombocytopenia:Concern for side effect of PAH medications.  Macitentan was stopped as above.  She was treated with B12 and iron.  Her platelets were slightly downtrending at time of discharge so SQ Lovenox ppx was held.  There are plans for interval repeat lab testing as outpatient prior to next clinic visit.  No signs/symptoms of active bleeding or infection during hospital stay. n     # Lupus/Sjogren's Syndrome: Rheumatology consulted this admission and  additional testing was sent per their recommendations.  At time of discharge an [facility] scleroderma panel was pending.  She has been referred to Rheumatology for outpatient follow up/     # Gout: No acute flare this admission, she continued Uloric.     # Arrhythmia: continued home Digoxin.    Tests Outstanding at Discharge Requiring Follow Up:  [facility] scleroderma panel (Send out)    Discharge Condition (required):  Improved.    Key Physical Exam Findings at Discharge:  Physical examination is significant for:  1+ lower ext pitting edema, JVP ~9cm.    Core Measures for Heart Failure:  The patient was not required to have her left ventricular ejection fraction measured, and is not required to have a prescription for an ACE inhibitor, angiotensin receptor blocker, or beta blocker, as her heart failure syndrome is that of right-sided heart failure due to pulmonary hypertension.    Discharge Diet:  Low-salt.    Discharge Medications:     What To Do With Your Medications      START taking these medications       Add'l Info    cyanocobalamin 500 MCG tablet   Take 1 tablet (500 mcg) by mouth daily.    Quantity:  30 tablet   Refills:  3       dapsone 100 MG tablet   Take 1 tablet (100 mg) by mouth daily Indications: Immunosuppression Prophylaxis.    Quantity:  30 tablet   Refills:  1       digoxin 0.25 MG tablet   Commonly known as:  LANOXIN   Take 1 tablet (250 mcg) by mouth every evening.    Quantity:  30 tablet   Refills:  3       furosemide 40 MG tablet   Commonly known as:  LASIX   Take 3 tablets (120 mg) by mouth 3 times daily.    Quantity:  252 tablet   Refills:  3       metolazone 5 MG tablet   Commonly known as:  ZAROXOLYN   Take 1 tablet (5 mg) by mouth three times a week. Monday, Wednesday, Friday    Quantity:  21 tablet   Refills:  3       predniSONE 10 MG tablet   Commonly known as:  DELTASONE   Take 4 tablets (40 mg) by mouth daily. Take 3m (4 tablets) on 6/10.  Starting 6/11 take 388mdaily (3 tablets) for 7  days, then on 6/18, take 2021m2 tablets) daily for 7 days, and on 6/25 take 15m21m tablet) daily until you follow up with Dr. PochBettey Mare Quantity:  53 tablet   Refills:  0         CHANGE how you take these medications       Add'l Info    * febuxostat 40 MG Tabs   Commonly known as:  ULORIC   Take 1 tablet (40 mg) by mouth daily.    Quantity:  30 tablet   Refills:  0   What changed:  Another medication with the same name was added. Make sure you understand how and when to take each.       * febuxostat 40 MG Tabs   Commonly known as:  ULORIC   Take 1 tablet (40 mg) by mouth daily.    Quantity:  30 tablet   Refills:  3   What changed:  You were already taking a medication with the same name, and this prescription was added. Make sure you understand how and when to take each.       * Notice:  This list has 2 medication(s) that are the same as other medications prescribed for you. Read the directions carefully, and ask your doctor or other care provider to review them with you.      CONTINUE taking these medications       Add'l Info    azelastine 0.1 % nasal spray   Commonly known as:  ASTELIN   Spray 1 spray into each nostril 2 times daily. Use in each nostril as directed    Quantity:  1 bottle   Refills:  3       DIVIGEL 0.25 MG/0.25GM Gel   Apply 0.25 mg topically nightly.   Generic drug:  Estradiol    Refills:  0       estradiol 2 MG vaginal ring  Commonly known as:  ESTRING   Insert 1 Ring vaginally Every 3 months. Follow package directions    Refills:  0       ferrous sulfate 324 (65 FE) MG Tbec   Take 1 tablet (324 mg) by mouth daily.    Quantity:  30 tablet   Refills:  0       FLOLAN injection   29ng/kg/min   Generic drug:  epoprostenol    Refills:  0       folic acid 540 MCG tablet   Commonly known as:  FOLVITE   1 TABLET DAILY    Refills:  0       medroxyPROGESTERone 2.5 MG tablet   Commonly known as:  PROVERA   Take 1.25 mg by mouth daily.    Refills:  0       potassium chloride 10 MEQ tablet   Commonly known  as:  KLOR-CON   Take 4 tablets (40 mEq) by mouth daily.    Quantity:  120 tablet   Refills:  0       sildenafil 20 MG tablet   Commonly known as:  REVATIO   Take 4 tablets (80 mg) by mouth 3 times daily.    Quantity:  360 tablet   Refills:  0       spironolactone 50 MG tablet   Commonly known as:  ALDACTONE   Take 1 tablet (50 mg) by mouth daily.    Quantity:  30 tablet   Refills:  0       vitamin D3 2000 UNITS capsule   Take 1 capsule by mouth daily.    Refills:  0         STOP taking these medications          bumetanide 2 MG tablet   Commonly known as:  BUMEX       colchicine 0.6 MG tablet            Where to Get Your Medications      These medications were sent to [facility]  [id number], [place] [address]    Hours:  Mon-Fri 8:30am-7:00pm, Sat-Sun 9am-5:00pm Phone:  [phone]     cyanocobalamin 500 MCG tablet    dapsone 100 MG tablet    digoxin 0.25 MG tablet    febuxostat 40 MG Tabs    furosemide 40 MG tablet    metolazone 5 MG tablet    predniSONE 10 MG tablet           Allergies:  Allergies   Allergen Reactions    Allopurinol Hives    Levaquin Swelling and Other     Severe joint pain      Ofloxacin Nausea and Vomiting    Chlorhexidine Itching    Sulfa Drugs Unspecified       Discharge Disposition:  Home.    Discharge Code Status:  Full code / full care     This code status is not changed from the time of admission.     Follow Up Appointments:    Scheduled appointments:  No future appointments.    For appointments requested for after discharge that have not yet been scheduled, refer to the Post Discharge Referrals section of the After Visit Summary.    Discharging 35 Contact Information:  [facility] operator at [phone].

## 2016-06-23 ENCOUNTER — Telehealth (HOSPITAL_BASED_OUTPATIENT_CLINIC_OR_DEPARTMENT_OTHER): Payer: Self-pay

## 2016-06-23 NOTE — Telephone Encounter (Signed)
Transitional Telephonic Nurse (TTN ) Post-Discharge Follow-Up Phone Call   Triggered Alert: Follow-up (false positive)    Pt reported that she is doing very well at home, "I felt like I got such an excellent care, not I am getting back on my feet!". Pt is on Prednisone  30mg  PO daily for one week, she is aware that On 6/18 take 20mg  daily for one week. On 6/25 take 10mg  daily for one week (or until you see Dr. [name]).  She confirmed, "I am taking all my medications as ordered.  Aware that she will receive a call from [facility] and arrange for pt to have your labs checked in about two weeks. Offered to scehdule her follow-up appointments, pt declined.  Pt stated she will call the clinic of Dr. [name] and Dr. Dianne Dun to schedule her appts. Thanked TTN for the follow-up call.     MJ Shanon Brow, MSN, PCCN, RN-BC  [facility]  Transitional Telephonic Nursing  T: [phone]   [email or web address]

## 2016-06-26 LAB — LAB MISC TEST

## 2016-07-03 ENCOUNTER — Encounter (HOSPITAL_COMMUNITY): Payer: Self-pay

## 2016-07-03 DIAGNOSIS — I2721 Secondary pulmonary arterial hypertension: Principal | ICD-10-CM

## 2016-07-03 DIAGNOSIS — K766 Portal hypertension: Principal | ICD-10-CM

## 2016-07-13 ENCOUNTER — Telehealth (INDEPENDENT_AMBULATORY_CARE_PROVIDER_SITE_OTHER): Payer: Self-pay | Admitting: Critical Care Medicine

## 2016-07-13 NOTE — Telephone Encounter (Signed)
Paged by operator for patient call. Responded to call and reached patient and her husband. They report ongoing nosebleeds today that have not stopped. They wonder if such bleeding can be due to pHTN meds and whether such meds should be adjusted. Patient is not on any blood thinners. She notably is thrombocytopenic. I advised against changing any pulmonary hypertension medications. I further advised that if bleeding does not stop with 30 minutes of sustained pressure, that they should go to the nearest ED for urgent evaluation (and possible packing vs cauterization). Patients husband stated that they would not due that due to lack of trust of the nearest hospital. He states they live an 8 hour drive from [place]. I reiterated my strong recommendation.

## 2016-07-14 ENCOUNTER — Inpatient Hospital Stay
Admission: EM | Admit: 2016-07-14 | Discharge: 2016-07-28 | DRG: 292 | Disposition: A | Discharge status: Home / Self Care | Payer: Medicare Other | Attending: Emergency Medicine | Admitting: Emergency Medicine

## 2016-07-14 ENCOUNTER — Telehealth (HOSPITAL_COMMUNITY): Payer: Self-pay

## 2016-07-14 ENCOUNTER — Encounter (HOSPITAL_COMMUNITY): Payer: Self-pay

## 2016-07-14 DIAGNOSIS — N189 Chronic kidney disease, unspecified: Secondary | ICD-10-CM | POA: Diagnosis present

## 2016-07-14 DIAGNOSIS — I130 Hypertensive heart and chronic kidney disease with heart failure and stage 1 through stage 4 chronic kidney disease, or unspecified chronic kidney disease: Principal | ICD-10-CM | POA: Diagnosis present

## 2016-07-14 DIAGNOSIS — Z882 Allergy status to sulfonamides status: Secondary | ICD-10-CM

## 2016-07-14 DIAGNOSIS — D696 Thrombocytopenia, unspecified: Secondary | ICD-10-CM | POA: Diagnosis present

## 2016-07-14 DIAGNOSIS — Z888 Allergy status to other drugs, medicaments and biological substances status: Secondary | ICD-10-CM

## 2016-07-14 DIAGNOSIS — R040 Epistaxis: Secondary | ICD-10-CM | POA: Diagnosis present

## 2016-07-14 DIAGNOSIS — E2740 Unspecified adrenocortical insufficiency: Secondary | ICD-10-CM | POA: Diagnosis present

## 2016-07-14 DIAGNOSIS — I272 Pulmonary hypertension, unspecified: Secondary | ICD-10-CM

## 2016-07-14 DIAGNOSIS — R197 Diarrhea, unspecified: Secondary | ICD-10-CM | POA: Diagnosis present

## 2016-07-14 DIAGNOSIS — I10 Essential (primary) hypertension: Secondary | ICD-10-CM | POA: Diagnosis present

## 2016-07-14 DIAGNOSIS — I2721 Secondary pulmonary arterial hypertension: Secondary | ICD-10-CM | POA: Diagnosis present

## 2016-07-14 DIAGNOSIS — I50812 Chronic right heart failure: Secondary | ICD-10-CM

## 2016-07-14 DIAGNOSIS — Z66 Do not resuscitate: Secondary | ICD-10-CM | POA: Diagnosis present

## 2016-07-14 DIAGNOSIS — N179 Acute kidney failure, unspecified: Secondary | ICD-10-CM | POA: Diagnosis present

## 2016-07-14 DIAGNOSIS — M329 Systemic lupus erythematosus, unspecified: Secondary | ICD-10-CM | POA: Diagnosis present

## 2016-07-14 DIAGNOSIS — Z8249 Family history of ischemic heart disease and other diseases of the circulatory system: Secondary | ICD-10-CM

## 2016-07-14 DIAGNOSIS — E876 Hypokalemia: Secondary | ICD-10-CM | POA: Diagnosis present

## 2016-07-14 DIAGNOSIS — D7581 Myelofibrosis: Secondary | ICD-10-CM | POA: Diagnosis present

## 2016-07-14 DIAGNOSIS — M3500 Sicca syndrome, unspecified: Secondary | ICD-10-CM | POA: Diagnosis present

## 2016-07-14 DIAGNOSIS — E871 Hypo-osmolality and hyponatremia: Secondary | ICD-10-CM | POA: Diagnosis present

## 2016-07-14 DIAGNOSIS — Z79899 Other long term (current) drug therapy: Secondary | ICD-10-CM

## 2016-07-14 DIAGNOSIS — M109 Gout, unspecified: Secondary | ICD-10-CM | POA: Diagnosis present

## 2016-07-14 DIAGNOSIS — Z87891 Personal history of nicotine dependence: Secondary | ICD-10-CM

## 2016-07-14 DIAGNOSIS — I50813 Acute on chronic right heart failure: Secondary | ICD-10-CM | POA: Diagnosis present

## 2016-07-14 DIAGNOSIS — D649 Anemia, unspecified: Secondary | ICD-10-CM | POA: Diagnosis present

## 2016-07-14 NOTE — ED Notes (Signed)
EKG completed and shown to Dr. Daune Perch along with previous EKG from [facility].

## 2016-07-14 NOTE — Telephone Encounter (Signed)
Spoke with [name].  She continues to have frequent nose bleeds.  Patient instructed to go to local ED for labs.  [name] states she will come to the [facility] ED.

## 2016-07-14 NOTE — Progress Notes (Addendum)
Prostacyclin (Flolan/Veletri/Remodulin)      Prostacyclin MCP 333.1      Prostacyclin: Flolan - IV    Prostacyclin Dosing Weight: 86 kg     Dose: 35 ng/kg/min    Concentration:  60,000 ng/mL    Rate: Flolan/Veletri/Remodulin IV CADD:  72 mL/24hr    Last Changed: 7/2 @noon   62 mLs remaining 7/2 at 2330  Changes every 24 hours    Has 2 pumps in date?: yes  1). Due Date: 12/2016  2). **in car, husband will go retrieve**    Awaiting pulm consult and Attending Pulm MD to place orders per policy. Please call pharmacy [phone] if any questions.    Wallis Mart, PHARMD

## 2016-07-14 NOTE — ED Notes (Addendum)
Pt on flolan/remodulin/veletri    Paged nursing supervisor,pulmonary fellow, and pharmacy to inform of pt presence in ER  Pt last changed cassette @ noon.  Pt brought extra pump/cassette    Flolan settings:  Rate :34ng/kg.min  Dose:36ml/24hr  Concentration:60,000  Reserve volume:86ml    Has extra pump, no spare cassettes  Last changed at noon on 07/14/16

## 2016-07-15 ENCOUNTER — Emergency Department (HOSPITAL_BASED_OUTPATIENT_CLINIC_OR_DEPARTMENT_OTHER): Payer: Medicare Other

## 2016-07-15 DIAGNOSIS — D696 Thrombocytopenia, unspecified: Secondary | ICD-10-CM

## 2016-07-15 DIAGNOSIS — R0602 Shortness of breath: Secondary | ICD-10-CM

## 2016-07-15 DIAGNOSIS — J811 Chronic pulmonary edema: Secondary | ICD-10-CM

## 2016-07-15 DIAGNOSIS — N179 Acute kidney failure, unspecified: Secondary | ICD-10-CM

## 2016-07-15 DIAGNOSIS — M3500 Sicca syndrome, unspecified: Secondary | ICD-10-CM

## 2016-07-15 DIAGNOSIS — R918 Other nonspecific abnormal finding of lung field: Secondary | ICD-10-CM

## 2016-07-15 DIAGNOSIS — E871 Hypo-osmolality and hyponatremia: Secondary | ICD-10-CM

## 2016-07-15 DIAGNOSIS — M329 Systemic lupus erythematosus, unspecified: Secondary | ICD-10-CM

## 2016-07-15 DIAGNOSIS — E876 Hypokalemia: Secondary | ICD-10-CM

## 2016-07-15 DIAGNOSIS — I2721 Secondary pulmonary arterial hypertension: Secondary | ICD-10-CM

## 2016-07-15 LAB — PREPARE PLATELET PHERESIS
Barcoded ABO/RH: 5100
Dispense Status: TRANSFUSED
Expiration: 201807032359
Platelet Pheresis: TRANSFUSED
Type: O POS

## 2016-07-15 LAB — CBC WITH DIFF, BLOOD
ANC-Manual Mode: 5.4 10*3/uL (ref 1.6–7.0)
ANC-Manual Mode: 4.3 10*3/uL (ref 1.6–7.0)
Abs Lymphs: 0.6 10*3/uL — ABNORMAL LOW (ref 0.8–3.1)
Abs Lymphs: 0.4 10*3/uL — ABNORMAL LOW (ref 0.8–3.1)
Abs Monos: 0.2 10*3/uL (ref 0.2–0.8)
Abs Monos: 0.1 10*3/uL — ABNORMAL LOW (ref 0.2–0.8)
Basophils: 1 %
Hct: 32.2 % — ABNORMAL LOW (ref 34.0–45.0)
Hct: 31.7 % — ABNORMAL LOW (ref 34.0–45.0)
Hgb: 11.2 gm/dL (ref 11.2–15.7)
Hgb: 10.6 gm/dL — ABNORMAL LOW (ref 11.2–15.7)
Lymphocytes: 10 %
Lymphocytes: 8 %
MCH: 26.4 pg (ref 26.0–32.0)
MCH: 25.2 pg — ABNORMAL LOW (ref 26.0–32.0)
MCHC: 34.8 g/dL (ref 32.0–36.0)
MCHC: 33.4 g/dL (ref 32.0–36.0)
MCV: 75.8 um3 — ABNORMAL LOW (ref 79.0–95.0)
MCV: 75.5 um3 — ABNORMAL LOW (ref 79.0–95.0)
Monocytes: 3 %
Monocytes: 3 %
Plt Count: 18 10*3/uL — CL (ref 140–370)
Plt Count: 16 10*3/uL — CL (ref 140–370)
RBC: 4.25 10*6/uL (ref 3.90–5.20)
RBC: 4.2 10*6/uL (ref 3.90–5.20)
RDW: 17.6 % — ABNORMAL HIGH (ref 12.0–14.0)
RDW: 17.4 % — ABNORMAL HIGH (ref 12.0–14.0)
Segs: 85 %
Segs: 87 %
WBC: 6.4 10*3/uL (ref 4.0–10.0)
WBC: 4.9 10*3/uL (ref 4.0–10.0)

## 2016-07-15 LAB — URINALYSIS WITH CULTURE REFLEX, WHEN INDICATED
Bilirubin: NEGATIVE
Blood: NEGATIVE
Glucose: NEGATIVE
Hyaline Cast: >5 — AB (ref 0–?)
Ketones: NEGATIVE
Leuk Esterase: NEGATIVE
Nitrite: NEGATIVE
Protein: NEGATIVE
Specific Gravity: 1.008 (ref 1.002–1.030)
Urobilinogen: NEGATIVE
pH: 6 (ref 5.0–8.0)

## 2016-07-15 LAB — BASIC METABOLIC PANEL, BLOOD
Anion Gap: 20 mmol/L — ABNORMAL HIGH (ref 7–15)
BUN: 140 mg/dL — ABNORMAL HIGH (ref 8–23)
Bicarbonate: 25 mmol/L (ref 22–29)
Calcium: 9.1 mg/dL (ref 8.5–10.6)
Chloride: 81 mmol/L — ABNORMAL LOW (ref 98–107)
Creatinine: 1.88 mg/dL — ABNORMAL HIGH (ref 0.51–0.95)
GFR: 27 mL/min
Glucose: 273 mg/dL — ABNORMAL HIGH (ref 70–99)
Potassium: 3.5 mmol/L (ref 3.5–5.1)
Sodium: 126 mmol/L — ABNORMAL LOW (ref 136–145)

## 2016-07-15 LAB — MDIFF
Bands: 1 % (ref 0–15)
Immature Granulocytes Absolute Manual: 0.1 10*3/uL (ref 0.0–0.1)
Metamyelocytes: 1 %
Number of Cells Counted: 115
Number of Cells Counted: 113
Promyelocyte: 1 %

## 2016-07-15 LAB — COMPREHENSIVE METABOLIC PANEL, BLOOD
ALT (SGPT): 16 U/L (ref 0–33)
ALT (SGPT): 19 U/L (ref 0–33)
AST (SGOT): 18 U/L (ref 0–32)
AST (SGOT): 20 U/L (ref 0–32)
Albumin: 3.6 g/dL (ref 3.5–5.2)
Albumin: 3.7 g/dL (ref 3.5–5.2)
Alkaline Phos: 153 U/L — ABNORMAL HIGH (ref 35–140)
Alkaline Phos: 174 U/L — ABNORMAL HIGH (ref 35–140)
Anion Gap: 14 mmol/L (ref 7–15)
Anion Gap: 17 mmol/L — ABNORMAL HIGH (ref 7–15)
BUN: 138 mg/dL — ABNORMAL HIGH (ref 8–23)
BUN: 145 mg/dL — ABNORMAL HIGH (ref 8–23)
Bicarbonate: 26 mmol/L (ref 22–29)
Bicarbonate: 28 mmol/L (ref 22–29)
Bilirubin, Tot: 0.69 mg/dL (ref <1.2)
Bilirubin, Tot: 0.72 mg/dL (ref <1.2)
Calcium: 9 mg/dL (ref 8.5–10.6)
Calcium: 9.1 mg/dL (ref 8.5–10.6)
Chloride: 80 mmol/L — ABNORMAL LOW (ref 98–107)
Chloride: 82 mmol/L — ABNORMAL LOW (ref 98–107)
Creatinine: 1.88 mg/dL — ABNORMAL HIGH (ref 0.51–0.95)
Creatinine: 1.94 mg/dL — ABNORMAL HIGH (ref 0.51–0.95)
GFR: 26 mL/min
GFR: 27 mL/min
Glucose: 117 mg/dL — ABNORMAL HIGH (ref 70–99)
Glucose: 177 mg/dL — ABNORMAL HIGH (ref 70–99)
Potassium: 2.6 mmol/L — CL (ref 3.5–5.1)
Potassium: 3.2 mmol/L — ABNORMAL LOW (ref 3.5–5.1)
Sodium: 122 mmol/L — ABNORMAL LOW (ref 136–145)
Sodium: 125 mmol/L — ABNORMAL LOW (ref 136–145)
Total Protein: 6.9 g/dL (ref 6.0–8.0)
Total Protein: 7.4 g/dL (ref 6.0–8.0)

## 2016-07-15 LAB — PROTHROMBIN TIME, BLOOD
INR: 1.3
PT,Patient: 13.9 s — ABNORMAL HIGH (ref 9.7–12.5)

## 2016-07-15 LAB — APTT, BLOOD: PTT: 31 s (ref 25–34)

## 2016-07-15 LAB — ABO/RH: ABO/RH: A POS

## 2016-07-15 LAB — SED RATE, BLOOD: Sed Rate: 32 mm/hr — ABNORMAL HIGH (ref 0–30)

## 2016-07-15 LAB — DIGOXIN, BLOOD: Digoxin: 4.4 ng/mL (ref 0.6–1.2)

## 2016-07-15 LAB — C-REACTIVE PROTEIN, BLOOD: CRP: 1.7 mg/dL — ABNORMAL HIGH (ref <0.5)

## 2016-07-15 LAB — C4, BLOOD: C4: 18 mg/dL (ref 10–40)

## 2016-07-15 LAB — URIC ACID, BLOOD: Uric Acid: 11.1 mg/dL — ABNORMAL HIGH (ref 2.4–5.7)

## 2016-07-15 LAB — C3, BLOOD: C3: 88 mg/dL — ABNORMAL LOW (ref 90–180)

## 2016-07-15 LAB — MAGNESIUM, BLOOD: Magnesium: 2.7 mg/dL — ABNORMAL HIGH (ref 1.6–2.4)

## 2016-07-15 LAB — BLOOD SMEAR ON CELLAVISION

## 2016-07-15 LAB — PRO BNP, BLOOD: BNPP: 29163 pg/mL — ABNORMAL HIGH (ref 0–899)

## 2016-07-15 MED ORDER — POTASSIUM CHLORIDE CRYS CR 10 MEQ OR TBCR
40.00 meq | EXTENDED_RELEASE_TABLET | Freq: Once | ORAL | Status: AC
Start: 2016-07-15 — End: 2016-07-15
  Administered 2016-07-15: 40 meq via ORAL
  Filled 2016-07-15: qty 4

## 2016-07-15 MED ORDER — VITAMIN D 1000 UNIT OR TABS
2000.0000 [IU] | ORAL_TABLET | Freq: Every day | ORAL | Status: DC
Start: 2016-07-15 — End: 2016-07-28
  Administered 2016-07-15 – 2016-07-28 (×14): 2000 [IU] via ORAL
  Filled 2016-07-15 (×14): qty 2

## 2016-07-15 MED ORDER — SODIUM CHLORIDE 0.9 % IJ SOLN (CUSTOM)
3.0000 mL | INTRAMUSCULAR | Status: DC | PRN
Start: 2016-07-15 — End: 2016-07-28
  Administered 2016-07-23: 3 mL via INTRAVENOUS

## 2016-07-15 MED ORDER — SODIUM CHLORIDE 0.9 % IV SOLN
INTRAVENOUS | Status: AC | PRN
Start: 2016-07-15 — End: 2016-07-17

## 2016-07-15 MED ORDER — SODIUM CHLORIDE 0.9 % IJ SOLN (CUSTOM)
3.0000 mL | INTRAMUSCULAR | Status: DC | PRN
Start: 2016-07-15 — End: 2016-07-28
  Administered 2016-07-18: 3 mL via INTRAVENOUS

## 2016-07-15 MED ORDER — NALOXONE HCL 0.4 MG/ML IJ SOLN
0.1000 mg | INTRAMUSCULAR | Status: DC | PRN
Start: 2016-07-15 — End: 2016-07-15

## 2016-07-15 MED ORDER — HYDROCORTISONE SOD SUCCINATE 100 MG IJ SOLR (CUSTOM)
100.0000 mg | Freq: Four times a day (QID) | INTRAMUSCULAR | Status: DC
Start: 2016-07-15 — End: 2016-07-17
  Administered 2016-07-15 – 2016-07-17 (×8): 100 mg via INTRAVENOUS
  Filled 2016-07-15 (×8): qty 2

## 2016-07-15 MED ORDER — MEDROXYPROGESTERONE ACETATE 2.5 MG OR TABS
1.2500 mg | ORAL_TABLET | Freq: Every day | ORAL | Status: DC
Start: 2016-07-15 — End: 2016-07-28
  Administered 2016-07-15 – 2016-07-28 (×14): 1.25 mg via ORAL
  Filled 2016-07-15 (×15): qty 1

## 2016-07-15 MED ORDER — FUROSEMIDE 10 MG/ML IJ SOLN
60.0000 mg | Freq: Two times a day (BID) | INTRAMUSCULAR | Status: DC
Start: 2016-07-15 — End: 2016-07-15
  Administered 2016-07-15: 60 mg via INTRAVENOUS
  Filled 2016-07-15: qty 6

## 2016-07-15 MED ORDER — SPIRONOLACTONE 25 MG OR TABS
50.0000 mg | ORAL_TABLET | Freq: Every day | ORAL | Status: DC
Start: 2016-07-15 — End: 2016-07-27
  Administered 2016-07-15 – 2016-07-27 (×13): 50 mg via ORAL
  Filled 2016-07-15 (×13): qty 2

## 2016-07-15 MED ORDER — SODIUM CHLORIDE 0.9% TKO INFUSION
INTRAVENOUS | Status: DC | PRN
Start: 2016-07-15 — End: 2016-07-28

## 2016-07-15 MED ORDER — PROSTACYCLIN CASSETTE CHANGE (~~LOC~~)
Freq: Every day | Status: DC
Start: 2016-07-15 — End: 2016-07-28
  Administered 2016-07-15 – 2016-07-27 (×13)
  Filled 2016-07-15: qty 1

## 2016-07-15 MED ORDER — FUROSEMIDE 10 MG/ML IJ SOLN
60.0000 mg | Freq: Two times a day (BID) | INTRAMUSCULAR | Status: DC
Start: 2016-07-16 — End: 2016-07-15

## 2016-07-15 MED ORDER — DIGOXIN 0.125 MG OR TABS
250.0000 ug | ORAL_TABLET | Freq: Every evening | ORAL | Status: DC
Start: 2016-07-15 — End: 2016-07-16
  Administered 2016-07-15: 250 ug via ORAL
  Filled 2016-07-15: qty 2

## 2016-07-15 MED ORDER — PROSTACYCLIN ICE PACK (~~LOC~~)
Freq: Four times a day (QID) | Status: DC
Start: 2016-07-15 — End: 2016-07-28
  Administered 2016-07-15 (×2)
  Administered 2016-07-16: 2
  Administered 2016-07-16: 18:00:00
  Administered 2016-07-16: 2
  Administered 2016-07-16 – 2016-07-17 (×5)
  Administered 2016-07-18: 2
  Administered 2016-07-18
  Administered 2016-07-18: 2
  Administered 2016-07-18 – 2016-07-19 (×3)
  Administered 2016-07-19 (×2): 2
  Administered 2016-07-20: 12:00:00
  Administered 2016-07-20 – 2016-07-21 (×7): 2
  Administered 2016-07-22: 18:00:00
  Administered 2016-07-22: 2
  Administered 2016-07-22 – 2016-07-24 (×9)
  Administered 2016-07-24: 1
  Administered 2016-07-25 – 2016-07-27 (×11)
  Administered 2016-07-27: 2
  Administered 2016-07-28 (×2)
  Filled 2016-07-15: qty 1

## 2016-07-15 MED ORDER — PROSTACYCLIN BATTERIES - PUMP (~~LOC~~)
Status: DC
Start: 2016-07-21 — End: 2016-07-28
  Administered 2016-07-21: 14:00:00
  Filled 2016-07-15: qty 1

## 2016-07-15 MED ORDER — SODIUM CHLORIDE 0.9 % IJ SOLN (CUSTOM)
3.0000 mL | Freq: Three times a day (TID) | INTRAMUSCULAR | Status: DC
Start: 2016-07-15 — End: 2016-07-28
  Administered 2016-07-15 – 2016-07-28 (×21): 3 mL via INTRAVENOUS

## 2016-07-15 MED ORDER — DOPAMINE IN D5W 1.6-5 MG/ML-% IV SOLN
3.0000 ug/kg/min | INTRAVENOUS | Status: DC
Start: 2016-07-15 — End: 2016-07-15

## 2016-07-15 MED ORDER — FOLIC ACID 1 MG OR TABS
1.0000 mg | ORAL_TABLET | Freq: Every day | ORAL | Status: DC
Start: 2016-07-15 — End: 2016-07-28
  Administered 2016-07-15 – 2016-07-28 (×14): 1 mg via ORAL
  Filled 2016-07-15 (×14): qty 1

## 2016-07-15 MED ORDER — SILDENAFIL CITRATE 20 MG OR TABS
80.0000 mg | ORAL_TABLET | Freq: Three times a day (TID) | ORAL | Status: DC
Start: 2016-07-15 — End: 2016-07-15
  Filled 2016-07-15: qty 4

## 2016-07-15 MED ORDER — SODIUM CHLORIDE 0.9 % IJ SOLN (CUSTOM)
3.0000 mL | Freq: Three times a day (TID) | INTRAMUSCULAR | Status: DC
Start: 2016-07-15 — End: 2016-07-28
  Administered 2016-07-15 – 2016-07-27 (×19): 3 mL via INTRAVENOUS

## 2016-07-15 MED ORDER — FERROUS SULFATE 324 (65 FE) MG PO TBEC
324.0000 mg | DELAYED_RELEASE_TABLET | Freq: Every day | ORAL | Status: DC
Start: 2016-07-15 — End: 2016-07-28
  Administered 2016-07-15 – 2016-07-28 (×14): 324 mg via ORAL
  Filled 2016-07-15 (×14): qty 1

## 2016-07-15 MED ORDER — PROSTACYCLIN TUBING (~~LOC~~)
Status: DC
Start: 2016-07-15 — End: 2016-07-28
  Administered 2016-07-15 – 2016-07-25 (×6)
  Filled 2016-07-15: qty 1

## 2016-07-15 MED ORDER — POTASSIUM CHLORIDE CRYS CR 10 MEQ OR TBCR
20.00 meq | EXTENDED_RELEASE_TABLET | Freq: Once | ORAL | Status: AC
Start: 2016-07-15 — End: 2016-07-15
  Administered 2016-07-15: 20 meq via ORAL
  Filled 2016-07-15: qty 2

## 2016-07-15 MED ORDER — FUROSEMIDE 10 MG/ML IJ SOLN
60.0000 mg | Freq: Three times a day (TID) | INTRAMUSCULAR | Status: DC
Start: 2016-07-15 — End: 2016-07-15

## 2016-07-15 MED ORDER — GLYCINE DILUENT IV SOLN
35.0000 ng/kg/min | INTRAVENOUS | Status: DC
Start: 2016-07-15 — End: 2016-07-28
  Administered 2016-07-15 – 2016-07-28 (×57): 35 ng/kg/min via INTRAVENOUS
  Filled 2016-07-15 (×15): qty 6

## 2016-07-15 MED ORDER — FUROSEMIDE 10 MG/ML IJ SOLN
60.0000 mg | Freq: Three times a day (TID) | INTRAMUSCULAR | Status: DC
Start: 2016-07-15 — End: 2016-07-18
  Administered 2016-07-15 – 2016-07-17 (×7): 60 mg via INTRAVENOUS
  Filled 2016-07-15 (×7): qty 6

## 2016-07-15 MED ORDER — SILDENAFIL CITRATE 20 MG OR TABS
80.0000 mg | ORAL_TABLET | Freq: Three times a day (TID) | ORAL | Status: DC
Start: 2016-07-15 — End: 2016-07-28
  Administered 2016-07-15 – 2016-07-28 (×38): 80 mg via ORAL
  Filled 2016-07-15 (×2): qty 4

## 2016-07-15 MED ORDER — CHLOROTHIAZIDE SODIUM 500 MG IV SOLR
500.00 mg | Freq: Once | INTRAVENOUS | Status: AC
Start: 2016-07-15 — End: 2016-07-15
  Administered 2016-07-15: 500 mg via INTRAVENOUS
  Filled 2016-07-15: qty 18

## 2016-07-15 MED ORDER — NALOXONE HCL 0.4 MG/ML IJ SOLN
0.1000 mg | INTRAMUSCULAR | Status: DC | PRN
Start: 2016-07-15 — End: 2016-07-28

## 2016-07-15 MED ORDER — PREDNISONE 20 MG OR TABS
40.0000 mg | ORAL_TABLET | Freq: Every day | ORAL | Status: DC
Start: 2016-07-15 — End: 2016-07-15
  Filled 2016-07-15: qty 2

## 2016-07-15 MED ORDER — AZELASTINE HCL 137 MCG/SPRAY NA SOLN
1.0000 | Freq: Two times a day (BID) | NASAL | Status: DC
Start: 2016-07-15 — End: 2016-07-28
  Administered 2016-07-15 – 2016-07-16 (×2): 1 via NASAL
  Filled 2016-07-15: qty 30

## 2016-07-15 MED ORDER — POTASSIUM CHLORIDE CRYS CR 10 MEQ OR TBCR
20.0000 meq | EXTENDED_RELEASE_TABLET | Freq: Once | ORAL | Status: DC
Start: 2016-07-15 — End: 2016-07-15

## 2016-07-15 MED ORDER — FUROSEMIDE 10 MG/ML IJ SOLN
60.0000 mg | Freq: Four times a day (QID) | INTRAMUSCULAR | Status: DC
Start: 2016-07-15 — End: 2016-07-15
  Administered 2016-07-15: 60 mg via INTRAVENOUS
  Filled 2016-07-15: qty 6

## 2016-07-15 NOTE — ED Notes (Signed)
Pt asleep on gurney at this time.  pts husband at bedside.  RR even and regular.  Pt in NAD.  Call light in reach. CTM pt.

## 2016-07-15 NOTE — ED Floor Report (Signed)
ED to IP Handoff    Report created by Jaynee Eagles, RN at 2:33 AM 07/15/2016.     HANDOFF REPORT UPDATE/CHANGES (changes in patient status/care/events prior to transfer)  By who:  Time:   Additional information:                                                                                                                                                     Diana Chambers is a 66 year old female.    Brief Summary of ED Visit :    This is a 66 year old female who comes to the ER for + Nose Bleeds (PHTN on FLOLAN here with nose bleeds intermittent x 1 wk referred to ER for platelet level of 45. also having increased dizziness. recently increased floan dose. CAD pump on and running . Pt  Has extra pump but does not have an extra cassette of medication.  Pt states she has had persistent nosebleeds since Wednesday.   Pt denies any other s/s but she states she has no appetite and her stomach is more upset. She has a lot of belching.  Pt is aox4 speaking in complete sentences.   Pmh, alleriges and home meds verified with pt.  Past Medical History:   Diagnosis Date    Pulmonary HTN     Sjoegren syndrome (CMS-HCC) 07/12/2009            RN shift assessment exceptions to WDL: +nosebleeds x1 week. Pt on home 02 and home [place] .    Any significant events and interventions with responses:  Pt labs drawn. 2 PIVs placed.     Radiologic studies not completed: n/a  (None unless otherwise noted)    Chief Complaint   Patient presents with    Nose Bleeds     PHTN on [place] here with nose bleeds intermittent x 1 wk referred to ER for platelet level 45. also having increased  dizziness. recently increased floan dose. CAD pump on and running        Admitted for: Thrombocytopenia    Code Status:  Please refer to In-pt admitting doctors orders     Level of Care: Med Surg     Is patient septic? no If yes, complete below:    BC x 2 drawn? no  If No explain:      Repeat lactate needed? no  If Yes, when is it due?      All initial  antibiotics given?  no  If No, explain:      Amount of IV fluids received 0 ml    Is patient on Heparin? no If yes, complete below:     Time Heparin bolus was given:     Additional drips patient is on: n/a    Cardiac rhythm: n/a    Oxygen Delivery: Nasal  Cannula 3 L/Min    Past Medical History:   Diagnosis Date    Pulmonary HTN     Sjoegren syndrome (CMS-HCC) 07/12/2009       No past surgical history on file.    Allergies: Allopurinol; Levaquin; Ofloxacin; Chlorhexidine; and Sulfa drugs    ED Fall Risk: No    Skin issues:  no    >> If yes, note areas of skin breakdown. See appropriate photos.      Ambulatory:  yes    Sitter needed: no    Suicide Risk:  no    Isolation Required: no     >> If yes , what type of isolation:     Is patient in custody?  no    Is patient in restraints? no    Vitals:    07/14/16 2242   BP: 125/55   Pulse: 92   Resp: 20   Temp: 99.1 F (37.3 C)   SpO2: 98%   Weight: 64.9 kg (143 lb)   Height: 5\' 2"  (1.575 m)            Lab Results   Component Value Date    WBC 6.4 07/14/2016    RBC 4.25 07/14/2016    HGB 11.2 07/14/2016    HCT 32.2 (L) 07/14/2016    MCV 75.8 (L) 07/14/2016    MCHC 34.8 07/14/2016    RDW 17.6 (H) 07/14/2016    PLT 18 (LL) 07/14/2016       Lab Results   Component Value Date    NA 122 (L) 07/14/2016    K 3.2 (L) 07/14/2016    CL 80 (L) 07/14/2016    BICARB 28 07/14/2016    BUN 145 (H) 07/14/2016    CREAT 1.88 (H) 07/14/2016    GLU 117 (H) 07/14/2016    [place] 9.0 07/14/2016       No results found for: BNP, PHOS, MG, LACTATE, AMMONIA, IONCA, ARTIONCA    No results found for: CPK, CKMBH, TROPONIN    No results found for: PH, PCO2, O2CONTENT, IVHC3, IVBE, O2SAT, UNPH, UNPCO2, ARTPH, ARTPCO2, ARTO2CNT, IAHC3, IABE, ARTO2SAT, UNAPH, UNAPCO2    No results found for this visit on 07/14/16.      Patient Lines/Drains/Airways Status    Active PICC Line / CVC Line / PIV Line / Drain / Airway / Intraosseous Line / Epidural Line / ART Line / Line Type / Wound     Name: Placement date:  Placement time: Site: Days:    Peripheral IV - 24 G Right Hand 07/14/16   2327   Hand   less than 1                    ED Handoff Report is ready for review.  Admitting RN may reach Emergency Department RN, Jaynee Eagles, RN, at [phone] with any questions.

## 2016-07-15 NOTE — ED Notes (Signed)
Paged dr Donald Prose for current platelet count. Ts.

## 2016-07-15 NOTE — ED Notes (Signed)
Pt ambulated to BR with husband at this time.

## 2016-07-15 NOTE — Plan of Care (Addendum)
Problem: Tissue Perfusion, Cardiopulmonary - Altered  Goal: Hemodynamic stability  Outcome: Progressing toward goal, anticipate improvement over: next 12-24 hours   Pt was admitted due to nose bleed and low platelet. Pt is hemodynamically stable. Denies any chest pain or any discomfort at this time. VS are stable/WNL. Neuro is intact as baseline.pt is NSR int the monitor. Flolan is ongoig.cont plan of care. Cont med surg tele level of care

## 2016-07-15 NOTE — ED Notes (Signed)
Pt ambulated to restroom and back.  No urine, pt had to defecate. Ts.

## 2016-07-15 NOTE — ED Notes (Addendum)
No call back from dr Ronny Flurry.   Paged dr Donald Prose, m. Ts.

## 2016-07-15 NOTE — ED Notes (Signed)
Given report to jennifer rn. Ts.

## 2016-07-15 NOTE — H&P (Signed)
Pulmonary Vascular H&P    CC:  Nose bleeds    HPI:  Diana Chambers is a 66 year old female with history of SLE and PAH 2/2 CTD and prior anorexigen use who presents today for nose bleeds and fatigue. Was recently hospitalized 05/27/16 to 06/21/16 for decompensated right heart failure. During that admission, received a right heart cath which showed severe PH and was diuresed 39L, and discharged on lasix, metolazone, aldactone. Her Macitentan was discontinued during this admission due to anasarca and persistent anemia, and Flolan was uptitrated to 35 ng/kg/min. Additionally, she was started on prednisone due to a concern for rheumatological component to decompensation and markedly improved, so was discharged on a prednisone taper with Dapsone prophylaxis. Patient recently completed her prednisone taper this past week.    Called due to persistent nose bleeds and outside labs (scanned in) showed platelets 28, creatinine 1.98. She was instructed to go to the ED and drove to [place]. Denies fevers. States breathing is at her baseline. Hasn't noticed any excessive swelling and has been avoiding salt. Does have some nausea and diarrhea, and poor appetite. Not doing much activity due to dyspnea and fatigue.     PMH/PSH:  Past Medical History:   Diagnosis Date    Pulmonary HTN     Sjoegren syndrome (CMS-HCC) 07/12/2009       Family Hx:  Family History   Problem Relation Age of Onset    Heart Disease Mother     Hypertension Mother     Psychiatry Mother     Heart Disease Father        Social Hx:   Social History   Substance Use Topics    Smoking status: Former Smoker     Packs/day: 0.50     Years: 5.00     Quit date: 01/13/1978    Smokeless tobacco: Never Used    Alcohol use No       Home meds:  No current facility-administered medications on file prior to encounter.      Current Outpatient Prescriptions on File Prior to Encounter   Medication Sig Dispense Refill    azelastine (ASTELIN) 0.1 % nasal spray Spray 1 spray  into each nostril 2 times daily. Use in each nostril as directed 1 bottle 3    Cholecalciferol 2000 UNITS CAPS Take 1 capsule by mouth daily.      cyanocobalamin 500 MCG tablet Take 1 tablet (500 mcg) by mouth daily. 30 tablet 3    dapsone 100 MG tablet Take 1 tablet (100 mg) by mouth daily Indications: Immunosuppression Prophylaxis. 30 tablet 1    digoxin (LANOXIN) 0.25 MG tablet Take 1 tablet (250 mcg) by mouth every evening. 30 tablet 3    Estradiol (DIVIGEL) 0.25 MG/0.25GM GEL Apply 0.25 mg topically nightly.      estradiol (ESTRING) 2 MG vaginal ring Insert 1 Ring vaginally Every 3 months. Follow package directions      febuxostat (ULORIC) 40 MG TABS Take 1 tablet (40 mg) by mouth daily. 30 tablet 3    febuxostat (ULORIC) 40 MG TABS Take 1 tablet (40 mg) by mouth daily. 30 tablet 0    ferrous sulfate 324 (65 FE) MG TBEC Take 1 tablet (324 mg) by mouth daily. 30 tablet 0    FLOLAN 1.5 MG IV SOLR 16WF/UX/NAT      FOLIC ACID 557 MCG OR TABS 1 TABLET DAILY      furosemide (LASIX) 40 MG tablet Take 3 tablets (120 mg) by  mouth 3 times daily. 252 tablet 3    medroxyPROGESTERone (PROVERA) 2.5 MG tablet Take 1.25 mg by mouth daily.      metolazone (ZAROXOLYN) 5 MG tablet Take 1 tablet (5 mg) by mouth three times a week. Monday, Wednesday, Friday 21 tablet 3    potassium chloride (KLOR-CON) 10 MEQ tablet Take 4 tablets (40 mEq) by mouth daily. 120 tablet 0    predniSONE (DELTASONE) 10 MG tablet Take 4 tablets (40 mg) by mouth daily. Take 80m (4 tablets) on 6/10.  Starting 6/11 take 357mdaily (3 tablets) for 7 days, then on 6/18, take 2061m2 tablets) daily for 7 days, and on 6/25 take 73m70m tablet) daily until you follow up with Dr. PochBettey Mare tablet 0    sildenafil (REVATIO) 20 MG tablet Take 4 tablets (80 mg) by mouth 3 times daily. 360 tablet 0    spironolactone (ALDACTONE) 50 MG tablet Take 1 tablet (50 mg) by mouth daily. 30 tablet 0       Allergies:  Allergies   Allergen Reactions     Allopurinol Hives    Levaquin Swelling and Other     Severe joint pain      Ofloxacin Nausea and Vomiting    Chlorhexidine Itching    Sulfa Drugs Unspecified       ROS: positives in bold  Constitutional: Negative for fever, chills, weight loss, weight gain  Eyes: Negative for blurry vision, loss of vision  ENT: Negative for hearing loss, ear pain, difficulty swallowing  CV:  LE edemaNegative for chest pain, palpitations,  Respiratory: DOENegative for SOB, , cough  GI: Negative for nausea, vomiting, abdominal pain, diarrhea, constipation  GU: Negative for dysuria, increased frequency, incontinence  Endocrine: Negative for excessive warmth, cold  MSK: Negative for muscle pain, joint pain, back pain  Neuro: Negative for headaches, dizziness, lightheadedness  Psych: Negative for depressed mood, anxiety, difficulty sleeping    OBJECTIVE  Temperature:  [98.6 F (37 C)-99.1 F (37.3 C)] 98.6 F (37 C) (07/03 0315)  Blood pressure (BP): (118-125)/(48-72) 122/48 (07/03 0715)  Heart Rate:  [86-92] 86 (07/03 0715)  Respirations:  [18-20] 18 (07/03 0315)  Pain Score: 0 (07/03 0315)  O2 Device: Nasal cannula (07/03 0715)  O2 Flow Rate (L/min):  [2 l/min-3 l/min] 2 l/min (07/03 0715)  SpO2:  [94 %-98 %] 94 % (07/03 0715)        Wt Readings from Last 1 Encounters:   07/14/16 64.9 kg (143 lb)     Weights (last 14 days)     Date/Time Weight Weight Source Percentage Weight Change (%) Who    07/14/16 2242 64.9 kg (143 lb) Standing scale 0 % BS            Intake /Output       Intake/Output Summary (Last 24 hours) at 07/15/16 1327  Last data filed at 07/15/16 1309   Gross per 24 hour   Intake                0 ml   Output              260 ml   Net             -260 ml     Physical Exam:  General:AOx3, no acute distress.  HEENT:PERRL, EOMI. No scleral icterus. +JVD  CV:RKD:TOIZTIWe and rhythm, +2/6+5/8tolic murmur, loud S2   Pulm:CTAB. No wheezes or crackles. Good air entry to bases bilaterally.  WGN:FAOZ, nontender, nondistended.  Normoactive bowel sounds.  Extremities:1+ LE edema. 2+ DP pulses  Skin: flushed, diffuse erythema on chest, macular erythematous rash on lower extremities     Labs  WBC 6.4 (07/02) HGB 11.2 (07/02) PLT 18* (07/02)    HCT 32.2* (07/02)        Na 122* (07/02) CL 80* (07/02) BUN 145* (07/02) GLU   117* (07/02)   K 3.2* (07/02) CO2 28 (07/02) Cr 1.88* (07/02)      TP 7.4 (07/02) ALT 19 (07/02) TBILI 0.72 (07/02) ALK PHOS  174* (07/02)   ALB 3.7 (07/02) AST 20 (07/02) DBILI        PT 13.9* (07/02) PTT 31 (07/02)   INR 1.3 (07/02)     BNPP 29,163    IMAGING/STUDIES  CXR       TTE 06/11/16  EF 88%  1. The left ventricular size is normal and the left ventricular systolic function is normal.  2. Mild or grade I (impaired relaxation pattern) left ventricular diastolic filling.  3. The right ventricular size is severely enlarged and systolic function is reduced.  4. Moderate tricuspid regurgitation.  5. The aortic valve is mildly stenotic.  6. Severe pulmonary hypertension with right ventricular systolic pressure measuring 75 mmHg.  7. Compared to prior study no significant change.    PFT 05/06/16  FEV1 0.79 (38%)/ FVC 1.24 (47%); FEV1/FVC 81%  DLCO 54%     RHC 05/27/16   Baseline  Units   RA [a/v (mean)] 19 mmHg   RV [Sys/Dias (RVEDP)] 93 (23) mmHg   PA [Sys/Dias (Mean)] 92/37 (62)  mmHg   PAOP [End Exp (Mean)]  18 (12) mmHg   Fick CO (CI) 4.74(2.67) L/min (L/min/m2)   TD CO (CI)  4.97(2.78) L/min (L/min/m2)   Fick PVR  9.28 WU   TD PVR  8.85 WU   PA sat%  64% %   AO sat%  97% %       ASSESSMENT AND PLAN  Diana Chambers is a 66 year old female with history of SLE and PAH 2/2 CTD and prior anorexigen use who presents for nose bleeds and fatigue, found to be in RV failure.     # Acute on chronic RV failure - Was recently in the hospital 1 month ago for decompensated R heart failure and at that time, improved with the addition of prednisone, arguing for rheum component to etiology. Tapered off prednisone several  days ago, but started to feel worse when she reduced the dose to around 20 mg. Lower suspicion for infection. Endorses compliance with her PH medications and low salt diet.   - Admit to med/surg  - Diuresis with Lasix 60 mg IV BID, goal 1-2L   - Continue spironolactone 50 mg daily  - Will add Dopamine if needed for additional support for diuresis   - Start stress dose steroids with hydrocortisone 100 mg q6H   - Continue current PAH therapies (Flolan, Revatio)   - Salt restrict   - Check ESR, CRP, C3, C4     # PAH -   - continue Flolan 35 ng/kg/min, Sildenafil 80 mg TID     # Thrombocytopenia - Unclear etiology. During last admission, platelets were in the 80s and now in the 10-20s. Most suspicious for drug-induced, with possible culprits being Uloric or less likely Dapsone. Also reports of Lasix being associated with isolated thrombocytopenia. Does have mild splenomegaly on last CT. Could be ITP. Less likely TTP, DIC.  No active bleeding at this time. Want to avoid transfusing platelets if possible as this can worsen pulmonary hypertension.   - Discontinue Uloric and Dapsone   - Start steroids, as above   - If worsens, consider heme consult    # AKI - Most likely cardiorenal from decompensated right failure. Similar presentation last admission, with creatinine improving after diuresis. Also has reported history of SLE so could consider lupus nephritis if doesn't improve.    - Diuresis, as above  - Check UA   - Monitor UOP   - Avoid nephrotoxins   - If does not improve with diuresis, will pursue further work up     # Hyponatremia - Likely due to volume overload.   - Fluid management, as above  - Trend BMP    # Connective tissue disease, SLE - Stable.   - Starting steroids as above      # FEN: low salt  # Ppx: contraindicated   Code Status: Full Code    Patient seen and discussed with attending Dr. Helyn Numbers, MD  Fellow, Pulmonary & Critical Care Medicine    Attending Attestation:  Patient seen and  discussed with the fellow, Dr. Ronny Flurry.  I have examined the patient and reviewed available radiology. I agree with the assessment and plan as outline above. Diana Chambers is a 66 year old female with SLE/Sjogren's and WHO group I pulmonary arterial hypertension on sildenafil and IV epoprostenol admitted for volume overload.  She had a prolonged hospital admission from mid-May 2018 to early June.  During that hospitalization, she had worsening renal failure and volume overload that was refractory to diuresis.  It was not until prednisone was added, did she improved and start to diurese.  She was discharged on a prolonged prednisone taper which just ended on Sunday.  She also had Uloric for gout and dapsone for PJP prophylaxis that were new medicines.  Since discharge, she had developed worsening thrombocytopenia with recurrent nose bleeds.  She has had worsening shortness of breath and lower extremity edema.  Her outside labs demonstrated worsening renal function.  She drove in from [place] with her husband for admission and further management. On exam, her heart rate is in the 80s.  Her blood pressure is in the 120s over 50s.  She is satting well with an SpO2 in the high 90s on 2 L via nasal cannula.  She is afebrile.  Her skin is flushed due to Flolan.  She has a heart that is regular rate and rhythm with a tricuspid regurgitation murmur but no S3.  She has scant ascites and normoactive bowel sounds.  She has a tunneled left internal jugular Hickman catheter with no surrounding erythema or discharge.  She has 1+ pitting pretibial edema.  Her neck veins are elevated at 45.  Her labs demonstrate a proBNP elevated to 27,741, a basic metabolic panel with hypervolemic hyponatremia, sodium 125, and hypokalemia with a potassium of 2.6.  Her creatinine is elevated to 1.94 from her baseline of 1.2 at discharge in early June.  She has severe thrombocytopenia with platelet count of 28786.  At discharge her  platelet count was 45,000. Her hemoglobin is stable at 10.6.  Her chest x-ray shows large pulmonary arteries with cardiomegaly and mild interstitial edema.    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  She may have an element of adrenal insufficiency in addition to the underlying inflammatory condition that responded well to steroids previously.  It is unclear to me why her platelet count is so low but I suspect is due to 1 of her 2 new medications, dapsone or Uloric which have both been stopped.    Plan:  1. Start stress dose steroids with hydrocortisone 100 mg IV q.6 hours; check inflammatory labs including CRP, ESR, C3 and C4 prior to restarting her steroids.  2. Continue IV epoprostenol at home dose  3. Continue sildenafil 80 mg t.i.d.  4. Will focus on IV diuresis with IV Lasix and Diuril; continue aldacont. Goal would be -2 to 3 L per 24 hours.  5. If she fails to respond to IV diuretics, I would have a low threshold for restarting inotropic support with dopamine at 3 micrograms/kilogram per minute.  Her May 27, 2016 right heart catheterization demonstrated a fairly preserved cardiac index of 2.6 so we will attempt to diurese her without inotropic support for the 1st 24 hours.  6. Will follow b.i.d. renal panels  7. We have held her Uloric and her dapsone.  These are the only 2 new medicines that may be contributing to thrombocytopenia.  I would recommend against platelet transfusion unless actively having massive bleeding.  Platelet products contain thromboxane which is a potent vasoconstrictor and can worsen pulmonary hypertension.  8. Her acute renal failure is most likely cardiorenal syndrome due to her right ventricular failure.  I suspect that as she  diureses, her renal function should improve.  9. She has end-stage pulmonary arterial hypertension.  We discussed her code status and I have recommended that she be do not attempt resuscitation but full care.  She and her husband will discuss this.    Kris Hartmann. Rudi Rummage, M.D., M.P.H.  Assistant Clinical Professor of Medicine  Division of Pulmonary and Critical Care Medicine  Pager [phone]/PID# [id number]

## 2016-07-15 NOTE — ED Notes (Addendum)
Pt resting quietly on gurney at this time.  RR even and regular.  Pt in NAD.  Pt asking for juice.  Ok for pt to have juice per MD.  Call light in reach. CTM pt.

## 2016-07-15 NOTE — ED Notes (Signed)
Checked pt's flolan pump:   37.1 mls res volume  72 mls/24hrs continuous.  60.10 mls given. Ts.

## 2016-07-15 NOTE — ED Notes (Signed)
Pt assisted to restroom and back.   Initial urine output p lasix 110 mls. Ts.

## 2016-07-15 NOTE — ED Notes (Signed)
This is a 66 year old female who comes to the ER for + Nose Bleeds (PHTN on FLOLAN here with nose bleeds intermittent x 1 wk referred to ER for platelet level of 45. also having increased dizziness. recently increased floan dose. CAD pump on and running . Pt  Has extra pump but does not have an extra cassette of medication.  Pt states she has had persistent nosebleeds since Wednesday.   Pt denies any other s/s but she states she has no appetite and her stomach is more upset. She has a lot of belching.  Pt is aox4 speaking in complete sentences.   Pmh, alleriges and home meds verified with pt.  Past Medical History:   Diagnosis Date    Pulmonary HTN     Sjoegren syndrome (CMS-HCC) 07/12/2009      Pt is in NAD.  pts husband at bedside. Oriented to room and call light.  Pt awaiting work up.  CTM pt.

## 2016-07-15 NOTE — ED Notes (Signed)
Picked up platelets, pt not consented.   Pt's flolan pump running out at 1200hrs.   Will contact pharmacy for [place]. Ts.

## 2016-07-15 NOTE — Interdisciplinary (Signed)
07/15/16 1440   Patient Information   Where was the patient admitted from? Home via ED   Why is Patient in the Hospital? 66 y/o female admitted on 07/14/2016 for Thrombocytopenia   Prior to Level of Function Ambulatory/Independent with ADL's   Primary Caretaker(s) Alone   Primary Contact Name Genessa Beman   Primary Contact Number [phone] cell or [phone] home   Primary Contact Relationship Spouse   Permission to [place] Other  (Disabled)   [employer] Information Income meets expenses   Referral To   Airline pilot;Other (Comment)  (Medicare A/B and UHC PPO)   Discharge Planning   Living Arrangements Spouse / significant other   Support Systems Spouse / significant other   Type of Residence Private residence;One story home  (Mailing Address: [address] [place], [place])   Anticipated Supplies/Services  Too soon to be determined   Barriers to Discharge Awaiting clinical improvement   Has discharge transport been arranged? Yes  (Spouse)   Scientist, product/process development   Patient/Family Are In Agreement With Discharge [place] w/no anticipated d/c needs   Plan/Interventions   Plan/Interventions Explore needs and options for aftercare, provide referrals   Social Worker Consult   Do you need to see a Education officer, museum?  No   Readmission Risk Assessment   Readmission Within 30 Days of Discharge Yes   Admission Was Unplanned   Patient Explanation  Got sicker   Recent Hospitalizations (Within Last 6 Months) Yes   High Risk For Readmission Yes   High Risk Indicators History of multiple hospital admissions;History of multiple emergent care use;Chronic illness   Recommendations to the Physician Home health orders;Therapy orders;Dietary evaluation;Case management consult   Initial Assessment Completed   CM Initial Assessment Completed     Attempted to assess pt at the bedside but pt unavailable-admission intake in process per nursing team.  Pt known to CM from previous  admission to [facility] and d/c on 06/21/2016.  Initial CM assessment performed per chart review and previous CM notes.  Pt admitted on 07/15/2016 for Thrombocytopenia.    Lives w/spouse in [facility] located in [place].  I-ADLs and denies ambulatory DME use.  Uses home O2 supplied by [employer] and on home flolan gtt provided by [employer].  No Hx of SNF or HHC.  PCP: unknown.  Ins: Medicare A/B and UHC PPO.  LACE: 73.  LOC: Inpatient.    Barriers to d/c: clinical improvement.  Transportation: spouse.  DCP: Home w/no anticipated d/c needs.  Pt, spouse, and care team aware and agree with current d/c plan.  CM to continue to follow for additional needs.  Initial UR completed.    Ria Clock MSN/RN/CNL/PHN  Inpatient care manager  [phone]

## 2016-07-15 NOTE — ED Notes (Signed)
Paged dr j. Wynona Canes) for blood product consent. Ts.

## 2016-07-15 NOTE — Plan of Care (Signed)
Problem: Bleeding, Risk of  Goal: Absence of active bleeding  Outcome: Progressing toward goal, anticipate improvement over: next 12-24 hours  No s/sx of any bleeding at this time. Latest platelet level is 16.     Problem: Fluid Volume Excess  Goal: Vital signs within specified parameters  Outcome: Progressing toward goal, anticipate improvement over: next 12-24 hours  Pt has + edema in BLE, slight abdominal distention. Pt is on lasix 60mg  TID, received x 1 dose of diuril IVP.foley in placed for strict intake and output. Cont weight daily.

## 2016-07-15 NOTE — ED Notes (Signed)
Lab tech called w platelets of 16.   Will page md. Ts.

## 2016-07-15 NOTE — ED Notes (Signed)
Flolan cartridge replaced  Res vol 99.6 mls.  Rate at 72 mls /24hrs.  0.45 mls dispensed. Ts.

## 2016-07-15 NOTE — ED Notes (Signed)
Pt ambulated to restroom w spouse.  Pulm present and ordered flolan.   Pharmacist tim notified and checking dosing. Ts. Ts.

## 2016-07-15 NOTE — ED Notes (Addendum)
Pt back from xray at this time.  Awaiting pulmonary MD for consult.

## 2016-07-15 NOTE — Plan of Care (Signed)
Problem: Bleeding, Risk of  Goal: Absence of active bleeding  Outcome: Goal Met  No signs or symptoms of bleeding at this time. Oxygen humidified.     Problem: Tissue Perfusion, Cardiopulmonary - Altered  Goal: Hemodynamic stability  Outcome: Goal Met  Vitals stable. No complaints of dizziness at this time. Some SOB with exertion.

## 2016-07-15 NOTE — Goals of Care (Signed)
GOALS OF CARE / ADVANCE CARE PLANNING CONVERSATION NOTE    A discussion was held with the patient and/or their surrogate decision maker regarding advanced care planning and goals of care.    Date and time of goals of care discussion:  5:00pm July 15, 2016    Persons present:  Jayona Mccaig and husband    What is the name of the patient's decision maker?  Husband, [name]    Has the patient made their wishes clear to their surrogate decision maker?  Yes    The following topics were discussed:  patient's wishes and desires, prognosis, disease trajectory and medical team's recommendations    Details of discussion (address all topics identified above):  We discussed her admission with right ventricular failure, worsening acute renal failure, and thrombocytopenia.  I discussed hopes that she gets better with medical therapy including diuresis and steroids.  In the event that she has a cardiac arrest, it would be unlikely that she would survive resuscitation efforts.  I recommend do not attempt resuscitation but full care.    What are the patient's priorities regarding quality of life?  She wishes to get better to be able to travel with [name].    After today's discussion, the patient's code status is confirmed as:  Do Not Attempt Resuscitation / full care.    Total time spent face-to-face with patient and/or surrogate conducting advance care planning:  25 minutes.    Kris Hartmann. Rudi Rummage, M.D., M.P.H.  Associate Clinical Professor of Medicine  Division of Pulmonary and Critical Care Medicine  Pager [phone]/PID# [id number]

## 2016-07-15 NOTE — ED Notes (Signed)
Platelet pheresis could not be returned to blood bank.   Blood product disposed of in red container. Ts.

## 2016-07-15 NOTE — ED Notes (Signed)
Pt to xray via gurney at this time

## 2016-07-15 NOTE — ED Provider Notes (Signed)
Emergency Department Provider Note  The Date of Service for the Emergency Room encounter is 07/14/2016 10:51 PM   Patient: Diana Chambers, MRN [id number], DOB [birth year]  Primary MD: Provider, Not In System  Chief Complaint   Patient presents with    Nose Bleeds     PHTN on [place] here with nose bleeds intermittent x 1 wk referred to ER for platelet level 45. also having increased  dizziness. recently increased floan dose. CAD pump on and running          HPI: Diana Chambers is a 66 year old female with pmh significant for  has a past medical history of Pulmonary HTN and Sjoegren syndrome (CMS-HCC) (07/12/2009). She also has no past medical history of CHRONIC AIRWAY OBSTRUCTION NEC or Unspecified asthma(493.90). presents with recurrent epistaxis.  Patient notes that she was told she was thrombocytopenic to 45 early last month and over the last 2 weeks she has been having increasing per of nose bleeds, now approximately 2 a day and lasting up to 30 minutes.  The patient is on Flolan and says her dose was recently increased. No new SOB or increase in her baseline O2 use. No trauma, f/c/n/v. No active bleeding at this time.      Review of Systems -- Pertinent positive ROS findings are provided in the above HPI, all other systems reviewed and are negative    Home Medications:  Prior to Admission Medications   Prescriptions Last Dose Informant Patient Reported? Taking?   Cholecalciferol 2000 UNITS CAPS Taking  Yes Yes   Sig: Take 1 capsule by mouth daily.   Estradiol (DIVIGEL) 0.25 MG/0.25GM GEL Taking  Yes Yes   Sig: Apply 0.25 mg topically nightly.   FLOLAN 1.5 MG IV SOLR Taking  Yes Yes   Sig: 27MB/EM/LJQ   FOLIC ACID 492 MCG OR TABS Taking  Yes Yes   Sig: 1 TABLET DAILY   azelastine (ASTELIN) 0.1 % nasal spray Taking  No Yes   Sig: Spray 1 spray into each nostril 2 times daily. Use in each nostril as directed   cyanocobalamin 500 MCG tablet Taking  No Yes   Sig: Take 1 tablet (500 mcg) by mouth daily.      dapsone 100 MG tablet Taking  No Yes   Sig: Take 1 tablet (100 mg) by mouth daily Indications: Immunosuppression Prophylaxis.   digoxin (LANOXIN) 0.25 MG tablet Taking  No Yes   Sig: Take 1 tablet (250 mcg) by mouth every evening.   estradiol (ESTRING) 2 MG vaginal ring Taking  Yes Yes   Sig: Insert 1 Ring vaginally Every 3 months. Follow package directions   febuxostat (ULORIC) 40 MG TABS Taking  No Yes   Sig: Take 1 tablet (40 mg) by mouth daily.   febuxostat (ULORIC) 40 MG TABS Taking  No Yes   Sig: Take 1 tablet (40 mg) by mouth daily.   ferrous sulfate 324 (65 FE) MG TBEC Taking  No Yes   Sig: Take 1 tablet (324 mg) by mouth daily.   furosemide (LASIX) 40 MG tablet Taking  No Yes   Sig: Take 3 tablets (120 mg) by mouth 3 times daily.   medroxyPROGESTERone (PROVERA) 2.5 MG tablet Taking  Yes Yes   Sig: Take 1.25 mg by mouth daily.   metolazone (ZAROXOLYN) 5 MG tablet Taking  No Yes   Sig: Take 1 tablet (5 mg) by mouth three times a week. Monday, Wednesday, Friday   potassium chloride (KLOR-CON)  10 MEQ tablet Taking  No Yes   Sig: Take 4 tablets (40 mEq) by mouth daily.   predniSONE (DELTASONE) 10 MG tablet Taking  No Yes   Sig: Take 4 tablets (40 mg) by mouth daily. Take 40mg  (4 tablets) on 6/10.  Starting 6/11 take 30mg  daily (3 tablets) for 7 days, then on 6/18, take 20mg  (2 tablets) daily for 7 days, and on 6/25 take 10mg  (1 tablet) daily until you follow up with Dr. [name].   sildenafil (REVATIO) 20 MG tablet Taking  No Yes   Sig: Take 4 tablets (80 mg) by mouth 3 times daily.   spironolactone (ALDACTONE) 50 MG tablet Taking  No Yes   Sig: Take 1 tablet (50 mg) by mouth daily.      Facility-Administered Medications: None       Allergies:Allopurinol; Levaquin; Ofloxacin; Chlorhexidine; and Sulfa drugs    Past Medical & Surgical History:  Past Medical History:   Diagnosis Date    Pulmonary HTN     Sjoegren syndrome (CMS-HCC) 07/12/2009   No past surgical history on file.    Family History:  Family History    Problem Relation Age of Onset    Heart Disease Mother     Hypertension Mother     Psychiatry Mother     Heart Disease Father        Social History:  Social History   Substance Use Topics    Smoking status: Former Smoker     Packs/day: 0.50     Years: 5.00     Quit date: 01/13/1978    Smokeless tobacco: Never Used    Alcohol use No       Physical Exam -- Vital signs reviewed and noted below. Nursing notes reviewed.  Vitals:    07/14/16 2242   BP: 125/55   Pulse: 92   Resp: 20   Temp: 99.1 F (37.3 C)   SpO2: 98%   Weight: 64.9 kg (143 lb)   Height: 5\' 2"  (1.575 m)       Constitutional: Chronically ill appearing, flolan pump in place and running  HENT: Normocephalic, dried blood in nares  Eyes: Conjunctiva normal, no drainage  Neck: Ranging comfortably, no asymmetry  Resp: No distress, coarse breath sounds bilaterally  CV: Regular rate, regular rhythm  MSK: No edema, no gross deformities  Skin: Warm, dry, numerous ecchymoses on BLUE  Neuro: No facial asymmetry, moving all four extremities spontaneously  Psych: Pleasant and cooperative      Assessment, Medical Decision Making, & Plan  Pt is a 66 year old female with history as above who presents with epistaxis. I suspect that her nosebleeds are due to her thrombocytopenia although I am uncertain of the exact cause of this at this time. It is possibly worsened by systemic vasodilation caused by her Flolan and drying of her nasal MM caused by her O2 via nasal cannula.    Diagnostics: cbc, cmp, pbnp, ekg, cxr  Treatments: pulmonary medicine consult    Patient seen and discussed with ED attending, Dr. Roderick Pee, [facility] Resident                Perlie Gold, MD  Resident  07/15/16 0139       Jay Schlichter, MD  07/15/16 [phone]

## 2016-07-15 NOTE — ED EKG Interpretation (Signed)
ED EKG Interpretation    EKG: Normal Sinus Rhythm with right axis deviation and nonspecific ST and T wave changes.  Unchanged from prior rate 89

## 2016-07-16 DIAGNOSIS — R7889 Finding of other specified substances, not normally found in blood: Secondary | ICD-10-CM

## 2016-07-16 LAB — BASIC METABOLIC PANEL, BLOOD
Anion Gap: 16 mmol/L — ABNORMAL HIGH (ref 7–15)
Anion Gap: 16 mmol/L — ABNORMAL HIGH (ref 7–15)
Anion Gap: 16 mmol/L — ABNORMAL HIGH (ref 7–15)
BUN: 141 mg/dL — ABNORMAL HIGH (ref 8–23)
BUN: 138 mg/dL — ABNORMAL HIGH (ref 8–23)
BUN: 141 mg/dL — ABNORMAL HIGH (ref 8–23)
Bicarbonate: 26 mmol/L (ref 22–29)
Bicarbonate: 27 mmol/L (ref 22–29)
Bicarbonate: 26 mmol/L (ref 22–29)
Calcium: 9.3 mg/dL (ref 8.5–10.6)
Calcium: 9.9 mg/dL (ref 8.5–10.6)
Calcium: 10.1 mg/dL (ref 8.5–10.6)
Chloride: 84 mmol/L — ABNORMAL LOW (ref 98–107)
Chloride: 81 mmol/L — ABNORMAL LOW (ref 98–107)
Chloride: 83 mmol/L — ABNORMAL LOW (ref 98–107)
Creatinine: 1.86 mg/dL — ABNORMAL HIGH (ref 0.51–0.95)
Creatinine: 1.9 mg/dL — ABNORMAL HIGH (ref 0.51–0.95)
Creatinine: 1.82 mg/dL — ABNORMAL HIGH (ref 0.51–0.95)
GFR: 27 mL/min
GFR: 26 mL/min
GFR: 28 mL/min
Glucose: 158 mg/dL — ABNORMAL HIGH (ref 70–99)
Glucose: 292 mg/dL — ABNORMAL HIGH (ref 70–99)
Glucose: 162 mg/dL — ABNORMAL HIGH (ref 70–99)
Potassium: 3 mmol/L — ABNORMAL LOW (ref 3.5–5.1)
Potassium: 3.4 mmol/L — ABNORMAL LOW (ref 3.5–5.1)
Potassium: 3.8 mmol/L (ref 3.5–5.1)
Sodium: 126 mmol/L — ABNORMAL LOW (ref 136–145)
Sodium: 124 mmol/L — ABNORMAL LOW (ref 136–145)
Sodium: 125 mmol/L — ABNORMAL LOW (ref 136–145)

## 2016-07-16 LAB — DIGOXIN, BLOOD: Digoxin: 3.3 ng/mL (ref 0.6–1.2)

## 2016-07-16 LAB — CBC WITH DIFF, BLOOD
ANC-Manual Mode: 2.9 10*3/uL (ref 1.6–7.0)
Abs Lymphs: 0.4 10*3/uL — ABNORMAL LOW (ref 0.8–3.1)
Abs Monos: 0.1 10*3/uL — ABNORMAL LOW (ref 0.2–0.8)
Hct: 31 % — ABNORMAL LOW (ref 34.0–45.0)
Hgb: 10.4 gm/dL — ABNORMAL LOW (ref 11.2–15.7)
Lymphocytes: 12 %
MCH: 25.3 pg — ABNORMAL LOW (ref 26.0–32.0)
MCHC: 33.5 g/dL (ref 32.0–36.0)
MCV: 75.4 um3 — ABNORMAL LOW (ref 79.0–95.0)
Monocytes: 2 %
Plt Count: 20 10*3/uL — ABNORMAL LOW (ref 140–370)
RBC: 4.11 10*6/uL (ref 3.90–5.20)
RDW: 17.1 % — ABNORMAL HIGH (ref 12.0–14.0)
Segs: 77 %
WBC: 3.4 10*3/uL — ABNORMAL LOW (ref 4.0–10.0)

## 2016-07-16 LAB — MAGNESIUM, BLOOD
Magnesium: 2.6 mg/dL — ABNORMAL HIGH (ref 1.6–2.4)
Magnesium: 2.7 mg/dL — ABNORMAL HIGH (ref 1.6–2.4)

## 2016-07-16 LAB — ECG 12-LEAD
ATRIAL RATE: 89 {beats}/min
P AXIS: 2 degrees
PR INTERVAL: 302 ms
QRS INTERVAL/DURATION: 92 ms
QT: 318 ms
QTC INTERVAL: 386 ms
R AXIS: 112 degrees
T AXIS: -22 degrees
VENTRICULAR RATE: 89 {beats}/min

## 2016-07-16 LAB — MDIFF
Bands: 7 % (ref 0–15)
Immature Granulocytes Absolute Manual: 0.1 10*3/uL (ref 0.0–0.1)
Metamyelocytes: 1 %
Myelocytes: 1 %
Number of Cells Counted: 113
Plt Est: DECREASED

## 2016-07-16 LAB — LIVER PANEL, BLOOD
ALT (SGPT): 13 U/L (ref 0–33)
AST (SGOT): 12 U/L (ref 0–32)
Albumin: 3.3 g/dL — ABNORMAL LOW (ref 3.5–5.2)
Alkaline Phos: 148 U/L — ABNORMAL HIGH (ref 35–140)
Bilirubin, Dir: 0.2 mg/dL (ref <0.2)
Bilirubin, Tot: 0.54 mg/dL (ref <1.2)
Total Protein: 6.8 g/dL (ref 6.0–8.0)

## 2016-07-16 LAB — PROTHROMBIN TIME, BLOOD
INR: 1.3
PT,Patient: 13.9 s — ABNORMAL HIGH (ref 9.7–12.5)

## 2016-07-16 LAB — MRSA SURVEILLANCE CULTURE

## 2016-07-16 MED ORDER — POTASSIUM CHLORIDE CRYS CR 10 MEQ OR TBCR
20.00 meq | EXTENDED_RELEASE_TABLET | Freq: Once | ORAL | Status: AC
Start: 2016-07-16 — End: 2016-07-16
  Administered 2016-07-16: 20 meq via ORAL
  Filled 2016-07-16: qty 2

## 2016-07-16 MED ORDER — METAMUCIL PO WAFR
2.0000 | WAFER | Freq: Every day | ORAL | Status: DC
Start: 2016-07-16 — End: 2016-07-19
  Administered 2016-07-16 – 2016-07-19 (×3): 2 via ORAL
  Filled 2016-07-16 (×3): qty 1

## 2016-07-16 MED ORDER — POTASSIUM CHLORIDE CRYS CR 10 MEQ OR TBCR
40.00 meq | EXTENDED_RELEASE_TABLET | Freq: Once | ORAL | Status: AC
Start: 2016-07-16 — End: 2016-07-16
  Administered 2016-07-16: 40 meq via ORAL
  Filled 2016-07-16: qty 4

## 2016-07-16 MED ORDER — CHLOROTHIAZIDE SODIUM 500 MG IV SOLR
500.00 mg | Freq: Once | INTRAVENOUS | Status: AC
Start: 2016-07-16 — End: 2016-07-16
  Administered 2016-07-16: 500 mg via INTRAVENOUS
  Filled 2016-07-16: qty 18

## 2016-07-16 MED ORDER — SENNA 8.6 MG OR TABS
2.0000 | ORAL_TABLET | Freq: Every evening | ORAL | Status: DC
Start: 2016-07-16 — End: 2016-07-28
  Administered 2016-07-16: 17.2 mg via ORAL
  Filled 2016-07-16 (×4): qty 2

## 2016-07-16 NOTE — Progress Notes (Signed)
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      Subjective:   Diuresed 2L yesterday   Feeling better this morning. Able to eat breakfast without nausea   No further nose bleeds     Objective  Temperature:  [97.6 F (36.4 C)-98.5 F (36.9 C)] 98.3 F (36.8 C) (07/04 0738)  Blood pressure (BP): (104-131)/(54-80) 129/54 (07/04 0738)  Heart Rate:  [81-89] 82 (07/04 0738)  Respirations:  [17-20] 18 (07/04 0738)  Pain Score: 0 (07/04 0738)  O2 Device: Nasal cannula (07/04 0738)  O2 Flow Rate (L/min):  [2 l/min-3 l/min] 3 l/min (07/04 0738)  SpO2:  [95 %-98 %] 96 % (07/04 0738)    Intake/Output Summary (Last 24 hours) at 07/16/16 1145  Last data filed at 07/16/16 0600   Gross per 24 hour   Intake              240 ml   Output             2425 ml   Net            -2185 ml     Physical Exam  General: NAD, lying in bed, comfortable   Chest: L tunneled IJ   CV: RRR, 3/6 systolic TR murmur. +JVD  Lungs: mild crackles at bases, otherwise CTAB   Abd: soft, nontender   Ext: trace edema bilateral LEs   Skin: flushing on face/trunk, scattered bruising/ecchymosis on upper extremities, mild petechiae/flolan rash on LEs    Labs  Lab Results   Component Value Date    WBC 3.4 (L) 07/16/2016    RBC 4.11 07/16/2016    HGB 10.4 (L) 07/16/2016    HCT 31.0 (L) 07/16/2016    MCV 75.4 (L) 07/16/2016    MCHC 33.5 07/16/2016    RDW 17.1 (H) 07/16/2016    PLT 20 (L) 07/16/2016    PLT 179 09/12/2008    MPV 12.4 06/12/2016     Lab Results   Component Value Date    NA 126 (L) 07/16/2016    K 3.0 (L) 07/16/2016    CL 84 (L) 07/16/2016    BICARB 26 07/16/2016    BUN 141 (H) 07/16/2016    CREAT 1.86 (H) 07/16/2016    GLU 158 (H) 07/16/2016    [place] 9.3 07/16/2016     Lab Results   Component Value Date    INR 1.3 07/16/2016    PTT 31 07/14/2016     Uric acid 11  Digoxin 4.4   ESR 32, CRP 1.70   C3 88, C4 18     Imaging  None  new     ASSESSMENT AND PLAN  Diana Chambers is a 66 year old year old female with 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     # [place]   # Acute on chronic RV failure - Responded well to diuresis, with improvement in GI symptoms of nausea. Still remains volume overloaded so will continue with diuresis.   - Continue lasix 60 mg IV TID with Diuril prn, goal -2-3L   - If needed, can use Dopamine but is diuresing well so far without inotropic support   - Continue PAH therapies (Flolan 35 ng/kg/min and sildenafil 80 mg  TID)  - Continue spironolactone 50 mg daily   - Continue stress dose steroids at hydrocortisone 100 mg q6H for now    # Thrombocytopenia - Platelets remain low, but slight improvement from yesterday. Etiology remains unclear, but most suspicious for drug-induced or ITP. No bleeding since admission.   - Continue to hold Uloric and Dapsone   - Continue hydrocortisone 100 mg q6H     # Elevated digoxin - Digoxin level returned at 4.4, likely due to poor clearance with AKI. Could also be contributing to symptoms of nausea. Will hold for now and recheck level.  - Hold digoxin   - Follow up repeat digoxin level   - Will closely monitor electrolytes     # AKI - Creatinine remains stable. Most likely due to cardiorenal given significant volume overload. UA bland. Will continue diuresing and monitoring creatinine.   - Continue diuresis, as above  - Avoid nephrotoxins     # Hyponatremia - Mildly improved from yesterday. Likely due to volume overload.   - Continue to diurese, as above   - BMP BID     # Hypokalemia - Related to diuresis.  - BMP BID     # CTD, SLE - Stable.   - Continue steroids, as above     # FEN: low salt  # PPX: contraindicated     Code: DNR/Full care     This patient was seen and discussed with Dr. [name].    Lucy Antigua, MD  Fellow, Pulmonary & Critical Care Medicine

## 2016-07-16 NOTE — Interdisciplinary (Signed)
Ask the lab tech to add or redraw digoxin while they are in the floor, jper lab tech they are not allowed to draw the pt twice unless it is a STAT,lab tech Kathlee Nations verbalized that we can page and they only come up in the floor for STAT. RN explained to the lab tech that it happened numerous times that whenever we page lab tech to draw nobody comes up.

## 2016-07-16 NOTE — Plan of Care (Signed)
21:00 Assumed care of patient. Patient A+Ox4,  denies pain, increased SOB or any other discomfort at this time. VS stable, afebrile, no s/s of acute distress. Flolan pump infusing as ordered. Patient calm and cooperative, husband at bedside.

## 2016-07-16 NOTE — Plan of Care (Signed)
Problem: Tissue Perfusion, Cardiopulmonary - Altered  Goal: Hemodynamic stability  Outcome: Progressing toward goal, anticipate improvement over: >48 hours  A&Ox4. VSS. Afebrile. NSR on tele. Pt denies cp, sob or dizziness. Also, pt is on continuous O2 at home and on 3 L of O2 in the hospital. Pt ambulates in a room without any distress. Husband at the bedside. Medications are updated by the fellow. Pt is on Flolan for many years and changes the cassette on her own (double verification is done also). Pt is educated of her medications and updated on plan of care. Skin care done. Pt is also on diuretics, K was replaced.

## 2016-07-16 NOTE — Progress Notes (Addendum)
Pulm Vascular Attending: Seen and examined with Dr. Ronny Flurry: please see her notes for details regarding hisotry, PE, meds, labs, assessment and plan.    Nausea better, ate breakfast and no nose bleeds.  BP 129/54 (BP Location: Left arm, BP Patient Position: Semi-Fowlers)   Pulse 82   Temp 98.3 F (36.8 C)   Resp 18   Ht _0  (1.575 m)   Wt 62.6 kg (138 lb 0.1 oz)   SpO2 96%   BMI 25.24 kg/m2 on 3L nasal cannula. I/o -1.9L  Diffuse erythem Flolan flush on face and trunk  Unable to see top of neck veins  Cath site clean  Bibasilar crackles and some pitting presacral edema  RRR with loud TR murmur, no S3  Abdomen with large, pulsatile liver. Soft, nontender  Trace edema    Chem notable for Na 126, K 3.0, BUN 141, Cr 1.86 - all similar to yesterday except hypokalemia  Dig 4.4  ESR 32  UA without pyruria, + hyaline casts  Wbc 3.4, Hgb 10, platelets 20K    1. PAH with volume overload and RV failure. Diuresing nicely: continuing current lasix IV tid and Diuril prn. No change in epoprostenol and 35 ng/kg/min or sildenafil 80 mg tid. Continue stress dose steroids  2. Thrombocytopenia: slight increase from yesterday. 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 Would only transfuse if she develops serious hemorrhage  3. Elevated digoxin level: likely due to decreased GFR. Dig held. Nausea could be partially due to this. EKG reviewed: has 1st degree AV block, but present last admission. No arrhythmia. Replacing K - keep around 4.0. On telemetry. No indication for treatment  4. Hypokalemia: replaced. Following BMP q 12  Hours  5. Hyponatremia: hypervolemic - diuresing  6. AKI: suspect cardiorenal and hope to see it improve with diuresis    Patient is DNAR/full care    Total time spent reviewing medication/labs, examining patients and formulating care plan with patient and family = 35 minutes    [name], MD

## 2016-07-17 LAB — MDIFF
Bands: 4 % (ref 0–15)
Number of Cells Counted: 114
Plt Est: DECREASED

## 2016-07-17 LAB — BASIC METABOLIC PANEL, BLOOD
Anion Gap: 15 mmol/L (ref 7–15)
Anion Gap: 15 mmol/L (ref 7–15)
BUN: 133 mg/dL — ABNORMAL HIGH (ref 8–23)
BUN: 136 mg/dL — ABNORMAL HIGH (ref 8–23)
Bicarbonate: 26 mmol/L (ref 22–29)
Bicarbonate: 27 mmol/L (ref 22–29)
Calcium: 9.9 mg/dL (ref 8.5–10.6)
Calcium: 10.1 mg/dL (ref 8.5–10.6)
Chloride: 87 mmol/L — ABNORMAL LOW (ref 98–107)
Chloride: 87 mmol/L — ABNORMAL LOW (ref 98–107)
Creatinine: 1.71 mg/dL — ABNORMAL HIGH (ref 0.51–0.95)
Creatinine: 1.64 mg/dL — ABNORMAL HIGH (ref 0.51–0.95)
GFR: 30 mL/min
GFR: 31 mL/min
Glucose: 133 mg/dL — ABNORMAL HIGH (ref 70–99)
Glucose: 236 mg/dL — ABNORMAL HIGH (ref 70–99)
Potassium: 2.8 mmol/L — ABNORMAL LOW (ref 3.5–5.1)
Potassium: 3.3 mmol/L — ABNORMAL LOW (ref 3.5–5.1)
Sodium: 128 mmol/L — ABNORMAL LOW (ref 136–145)
Sodium: 129 mmol/L — ABNORMAL LOW (ref 136–145)

## 2016-07-17 LAB — CBC WITH DIFF, BLOOD
ANC-Manual Mode: 5.4 10*3/uL (ref 1.6–7.0)
Abs Lymphs: 0.4 10*3/uL — ABNORMAL LOW (ref 0.8–3.1)
Abs Monos: 0.1 10*3/uL — ABNORMAL LOW (ref 0.2–0.8)
Hct: 29.5 % — ABNORMAL LOW (ref 34.0–45.0)
Hgb: 10.2 gm/dL — ABNORMAL LOW (ref 11.2–15.7)
Lymphocytes: 7 %
MCH: 26.4 pg (ref 26.0–32.0)
MCHC: 34.6 g/dL (ref 32.0–36.0)
MCV: 76.4 um3 — ABNORMAL LOW (ref 79.0–95.0)
Monocytes: 2 %
Plt Count: 24 10*3/uL — ABNORMAL LOW (ref 140–370)
RBC: 3.86 10*6/uL — ABNORMAL LOW (ref 3.90–5.20)
RDW: 17.3 % — ABNORMAL HIGH (ref 12.0–14.0)
Segs: 87 %
WBC: 5.9 10*3/uL (ref 4.0–10.0)

## 2016-07-17 LAB — MAGNESIUM, BLOOD: Magnesium: 2.7 mg/dL — ABNORMAL HIGH (ref 1.6–2.4)

## 2016-07-17 MED ORDER — POTASSIUM CHLORIDE CRYS CR 10 MEQ OR TBCR
40.00 meq | EXTENDED_RELEASE_TABLET | Freq: Once | ORAL | Status: AC
Start: 2016-07-17 — End: 2016-07-17
  Administered 2016-07-17: 40 meq via ORAL
  Filled 2016-07-17: qty 4

## 2016-07-17 MED ORDER — CHLOROTHIAZIDE SODIUM 500 MG IV SOLR
500.00 mg | Freq: Once | INTRAVENOUS | Status: AC
Start: 2016-07-17 — End: 2016-07-17
  Administered 2016-07-17: 500 mg via INTRAVENOUS
  Filled 2016-07-17: qty 18

## 2016-07-17 MED ORDER — HYDROCORTISONE SOD SUCCINATE 100 MG IJ SOLR (CUSTOM)
100.0000 mg | Freq: Three times a day (TID) | INTRAMUSCULAR | Status: DC
Start: 2016-07-17 — End: 2016-07-18
  Administered 2016-07-17 – 2016-07-18 (×3): 100 mg via INTRAVENOUS
  Filled 2016-07-17 (×3): qty 2

## 2016-07-17 MED ORDER — POTASSIUM CHLORIDE CRYS CR 10 MEQ OR TBCR
40.00 meq | EXTENDED_RELEASE_TABLET | ORAL | Status: AC
Start: 2016-07-17 — End: 2016-07-17
  Administered 2016-07-17 (×2): 40 meq via ORAL
  Filled 2016-07-17 (×2): qty 4

## 2016-07-17 NOTE — Progress Notes (Addendum)
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      Subjective:   Diuresed 1.4L yesterday with dose of Diuril   No further nose bleeds   Feeling better overall    Objective  Temperature:  [97.8 F (36.6 C)-98.7 F (37.1 C)] 97.8 F (36.6 C) (07/05 0828)  Blood pressure (BP): (106-132)/(58-73) 124/58 (07/05 0828)  Heart Rate:  [80-86] 82 (07/05 0828)  Respirations:  [19-20] 20 (07/05 0828)  Pain Score: 0 (07/05 0828)  O2 Device: Nasal cannula (07/05 0828)  O2 Flow Rate (L/min):  [3 l/min] 3 l/min (07/05 0828)  SpO2:  [96 %-98 %] 97 % (07/05 0828)    Intake/Output Summary (Last 24 hours) at 07/17/16 1112  Last data filed at 07/17/16 0900   Gross per 24 hour   Intake          1032.94 ml   Output             2625 ml   Net         -1592.06 ml     Physical Exam  General: NAD, lying in bed, comfortable   Chest: L tunneled IJ   CV: RRR, 3/6 systolic TR murmur. +JVD  Lungs: mild crackles at bases, otherwise CTAB   Abd: soft, nontender, mildly distended  Ext: trace edema bilateral LEs   Skin: flushing on face/trunk, scattered bruising/ecchymosis on upper extremities, mild petechiae/flolan rash on LEs    Labs  Lab Results   Component Value Date    WBC 5.9 07/17/2016    RBC 3.86 (L) 07/17/2016    HGB 10.2 (L) 07/17/2016    HCT 29.5 (L) 07/17/2016    MCV 76.4 (L) 07/17/2016    MCHC 34.6 07/17/2016    RDW 17.3 (H) 07/17/2016    PLT 24 (L) 07/17/2016    PLT 179 09/12/2008    MPV 12.4 06/12/2016     Lab Results   Component Value Date    NA 128 (L) 07/17/2016    K 2.8 (L) 07/17/2016    CL 87 (L) 07/17/2016    BICARB 26 07/17/2016    BUN 133 (H) 07/17/2016    CREAT 1.71 (H) 07/17/2016    GLU 133 (H) 07/17/2016    [place] 9.9 07/17/2016     Lab Results   Component Value Date    INR 1.3 07/16/2016     Uric acid 11  Digoxin 4.4   ESR 32, CRP 1.70   C3 88, C4 18     Imaging  None new     ASSESSMENT AND PLAN  Belle Charlie is a 66 year old year old female with 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     # [place]   # Acute on chronic RV failure - Responding well to diuresis, with improvement in GI symptoms of nausea. Still remains volume overloaded so will continue with diuresis.   - Continue lasix 60 mg IV TID with Diuril prn, goal -2-3L   - If needed, can use Dopamine but is diuresing well so far without inotropic support   - Continue PAH therapies (Flolan 35 ng/kg/min and sildenafil 80 mg TID)  - Continue spironolactone 50 mg daily   - Will wean steroids hydrocortisone 100 mg  q8H    # Thrombocytopenia - Platelets remain low, but continue to slowly rise. Etiology remains unclear, but most suspicious for drug-induced or ITP. No bleeding since admission.   - Continue to hold Uloric and Dapsone   - Reduce to hydrocortisone 100 mg q8H    # Elevated digoxin - Likely due to poor clearance with AKI. Could also be contributing to symptoms of nausea.   - Hold digoxin. Need to clarify with her outpatient pulmonologist regarding restarting   - Will repeat digoxin level  - Will closely monitor electrolytes     # AKI - Creatinine slightly improving. Most likely due to cardiorenal given significant volume overload. UA bland. Will continue diuresing  - Continue diuresis, as above  - Avoid nephrotoxins     # Hyponatremia - Remains hyponatremic, but improved from yesterday. Likely due to hypervolemia  - Continue to diurese, as above   - BMP BID     # Hypokalemia - Related to diuresis. Repleting as needed.     # CTD, SLE - Stable. Continue steroids, as above     # FEN: low salt  # PPX: contraindicated     Code: DNR/Full care     This patient was seen and discussed with Dr. Rudi Rummage.    Lucy Antigua, MD  Fellow, Pulmonary & Critical Care Medicine    Attending Attestation:  Patient seen and discussed with the fellow, Dr. Ronny Flurry.  I have examined the  patient and reviewed available radiology. I agree with the assessment and plan as outline above. Briefly, Mrs. 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  She feels better since admission. Slept poorly last night. Her creatinine has improved to 1.71.  Her platelet count has increased slightly to 24,000. She is -3.7 L since admission.  Exam with flushed skin, tricuspid regurgitation murmur, no S3, clear lungs, pulsatile liver, scant peripheral edema. Agree with plans to continue diuresis with goal 1-2 L negative.  She is diuresing well with out need for inotropic support.  Her platelet count is increasing and she has no active bleeding so we will continue to follow.  Continue her epoprostenol and sildenafil.  We will reduce stress dose steroids to hydrocortisone 100 mg q.8 hours.  Plan to go to prednisone 60 mg daily after 72 hours of intravenous dosing.  Follow electrolytes b.i.d.; replete potassium as necessary.  As per previous discussion, she remains DNAR-full care.    Kris Hartmann. Rudi Rummage, M.D., M.P.H.  Assistant Clinical Professor of Medicine  Division of Pulmonary and Critical Care Medicine  Pager [phone]# [phone]

## 2016-07-17 NOTE — Plan of Care (Signed)
Problem: Tissue Perfusion, Cardiopulmonary - Altered  Goal: Hemodynamic stability  Outcome:   Patient A+Ox4, afebrile, VS stable. Tele monitor showing NSR 80 with, 1st degree AVB. Trace edema noted, diuretics scheduled in AM. Strict I&O's, Foley catheter to gravity, daily weight.    Problem: Bleeding, Risk of  Goal: Absence of active bleeding  Outcome: Progressing toward goal, anticipate improvement over: next 12-24 hours  No s/s of  bleeding at this time. See AM Hemogram results.    Problem: Fluid Volume Excess  Goal: Vital signs within specified parameters  Outcome: Progressing toward goal, anticipate improvement over: next 12-24 hours  Trace edema noted, No increased SOB reported. Diuretics scheduled in AM. Strict I&O's, Foley catheter to gravity, daily weight.    PROBLEM: Coping -Ineffective, patient or family  Goal: Effective or improved doping  Outcome:Resource RN and CCP reported patient's episode of anxiety,  worried about dying. Patient reported feeling better after verbalizing her feelings and talking to the CCP. Husband at bedside. No s/s of acute distress at this time. Will report to AM RN to follow up with Education officer, museum.

## 2016-07-17 NOTE — Plan of Care (Signed)
Problem: Bleeding, Risk of  Goal: Absence of active bleeding  Outcome: Goal Met  Plt 24, generalized echymosis, skin red, bleeding noted w/removal of PIV, held pressure until bleeding stopped; skin otherwise intact, urine clear. No s/s of active bleeding noted at this time.

## 2016-07-17 NOTE — Plan of Care (Signed)
Problem: Tissue Perfusion, Cardiopulmonary - Altered  Goal: Hemodynamic stability  Outcome: Goal Met  Pt a & o x , SR on monitor to 80's, no ectopies noted; replaced K as ordered, Meds admin per orders, no adverse reaction noted, see eMAK for details. Peripheral pulses present and palpable, edema noted to BLE.  Pt denies CP/pressure/palps, skin flushed, warm dry. Pt verbalized understanding of education provided incl diet, activity, medication management, wt monitoring, and agrees to plan of care; Continue to monitor and assess.      Problem: Fluid Volume Excess  Goal: Absence of edema; peripheral and/or pulmonary  Outcome: Progressing toward goal, anticipate improvement over: next 12-24 hours  Stricty monitor I&O's, daily weights, lab values; no fluid restriction ordered. Patient receiving IV lasix, urine output for prior 24 hours over 2.6L. Daily wt documented. Lung sounds dim to auscultation, edema noted to BLE. Pt verbalized understanding of education provided and agrees to plan. Continue to monitor.        Problem: Breathing Pattern - Ineffective  Goal: Arterial blood gas values and/or saturation return to baseline  Outcome: Goal Met  Assess and monitor pt VS, LOC, RR. Ensure pt has dedicated line for flolan, 2 pumps at bedside, certified by [place]; Verify orders and pump settings and pump set to "RUN" at start of each shift w/2nd RN, change ice packs q 6 hours, change cassette qd at 1400, change tubing, ensure flow rate sheet is posted in room; ensure 2nd PIV placed and patent.  Pt on Epoprostenol 60,000 ng/ml infusing at 35 ng/kg/min, 72 ml/24 hours to dedicated line.  Pt has 2nd PIV; 2 pumps at bedside belong to pt. RR even & unlabored, lung sounds dim, pt reports DOE, no presence of adventitious sounds noted, spO2 at 99% on 3L via NC.  Continue to monitor and assess.

## 2016-07-18 LAB — BILIRUBIN, DIR BLOOD: Bilirubin, Dir: <0.2 mg/dL (ref <0.2)

## 2016-07-18 LAB — MAGNESIUM, BLOOD: Magnesium: 2.7 mg/dL — ABNORMAL HIGH (ref 1.6–2.4)

## 2016-07-18 LAB — CBC WITH DIFF, BLOOD
ANC-Manual Mode: 4.7 10*3/uL (ref 1.6–7.0)
Abs Lymphs: 0.6 10*3/uL — ABNORMAL LOW (ref 0.8–3.1)
Abs Monos: 0.2 10*3/uL (ref 0.2–0.8)
Hct: 32.2 % — ABNORMAL LOW (ref 34.0–45.0)
Hgb: 10.6 gm/dL — ABNORMAL LOW (ref 11.2–15.7)
Lymphocytes: 11 %
MCH: 25.7 pg — ABNORMAL LOW (ref 26.0–32.0)
MCHC: 32.9 g/dL (ref 32.0–36.0)
MCV: 78.2 um3 — ABNORMAL LOW (ref 79.0–95.0)
Monocytes: 3 %
Plt Count: 23 10*3/uL — ABNORMAL LOW (ref 140–370)
RBC: 4.12 10*6/uL (ref 3.90–5.20)
RDW: 17.5 % — ABNORMAL HIGH (ref 12.0–14.0)
Segs: 85 %
WBC: 5.5 10*3/uL (ref 4.0–10.0)

## 2016-07-18 LAB — DIRECT COOMBS: Direct Coombs Poly: NEGATIVE

## 2016-07-18 LAB — RETICULOCYTES AUTOMATED, BLOOD
Retic %, Auto: 3.1 % — ABNORMAL HIGH (ref 0.5–1.8)
Retic Count, Absolute: 136 10*3/uL — ABNORMAL HIGH (ref 16.0–78.0)

## 2016-07-18 LAB — MDIFF
Immature Granulocytes Absolute Manual: 0.1 10*3/uL (ref 0.0–0.1)
Myelocytes: 1 %
Number of Cells Counted: 113
Plt Est: DECREASED

## 2016-07-18 LAB — FERRITIN, BLOOD: Ferritin: 104 ng/mL (ref 13–150)

## 2016-07-18 LAB — BASIC METABOLIC PANEL, BLOOD
Anion Gap: 17 mmol/L — ABNORMAL HIGH (ref 7–15)
Anion Gap: 17 mmol/L — ABNORMAL HIGH (ref 7–15)
BUN: 132 mg/dL — ABNORMAL HIGH (ref 8–23)
BUN: 126 mg/dL — ABNORMAL HIGH (ref 8–23)
Bicarbonate: 23 mmol/L (ref 22–29)
Bicarbonate: 26 mmol/L (ref 22–29)
Calcium: 10.3 mg/dL (ref 8.5–10.6)
Calcium: 10.4 mg/dL (ref 8.5–10.6)
Chloride: 92 mmol/L — ABNORMAL LOW (ref 98–107)
Chloride: 89 mmol/L — ABNORMAL LOW (ref 98–107)
Creatinine: 1.61 mg/dL — ABNORMAL HIGH (ref 0.51–0.95)
Creatinine: 1.59 mg/dL — ABNORMAL HIGH (ref 0.51–0.95)
GFR: 32 mL/min
GFR: 32 mL/min
Glucose: 108 mg/dL — ABNORMAL HIGH (ref 70–99)
Glucose: 270 mg/dL — ABNORMAL HIGH (ref 70–99)
Potassium: 3.1 mmol/L — ABNORMAL LOW (ref 3.5–5.1)
Potassium: 3.2 mmol/L — ABNORMAL LOW (ref 3.5–5.1)
Sodium: 132 mmol/L — ABNORMAL LOW (ref 136–145)
Sodium: 132 mmol/L — ABNORMAL LOW (ref 136–145)

## 2016-07-18 LAB — IBC - IRON BINDING CAPACITY
Iron Saturation: 20 %
Iron: 50 ug/dL (ref 37–145)
Total IBC: 253 ug/dL (ref 148–506)
UIBC: 203 ug/dL (ref 112–346)

## 2016-07-18 LAB — BILIRUBIN, TOTAL BLOOD: Bilirubin, Tot: 0.46 mg/dL (ref <1.2)

## 2016-07-18 LAB — LDH, BLOOD: LDH: 236 U/L — ABNORMAL HIGH (ref 25–175)

## 2016-07-18 MED ORDER — POTASSIUM CHLORIDE CRYS CR 10 MEQ OR TBCR
40.00 meq | EXTENDED_RELEASE_TABLET | ORAL | Status: AC
Start: 2016-07-18 — End: 2016-07-18
  Administered 2016-07-18 (×2): 40 meq via ORAL
  Filled 2016-07-18 (×2): qty 4

## 2016-07-18 MED ORDER — CHLOROTHIAZIDE SODIUM 500 MG IV SOLR
500.00 mg | Freq: Once | INTRAVENOUS | Status: AC
Start: 2016-07-18 — End: 2016-07-18
  Administered 2016-07-18: 500 mg via INTRAVENOUS
  Filled 2016-07-18: qty 18

## 2016-07-18 MED ORDER — FUROSEMIDE 10 MG/ML IJ SOLN
60.0000 mg | Freq: Four times a day (QID) | INTRAMUSCULAR | Status: DC
Start: 2016-07-18 — End: 2016-07-24
  Administered 2016-07-18 – 2016-07-24 (×26): 60 mg via INTRAVENOUS
  Filled 2016-07-18 (×26): qty 6

## 2016-07-18 MED ORDER — PREDNISONE 50 MG OR TABS
60.0000 mg | ORAL_TABLET | Freq: Every day | ORAL | Status: DC
Start: 2016-07-18 — End: 2016-07-21
  Administered 2016-07-18 – 2016-07-21 (×4): 60 mg via ORAL
  Filled 2016-07-18 (×4): qty 1

## 2016-07-18 NOTE — Progress Notes (Addendum)
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      Subjective:   Diuresed -800cc yesterday with dose of Diuril   Feels much better this morning  Hungry, wants to eat breakfast and walk around later today     Objective  Temperature:  [97.7 F (36.5 C)-98.1 F (36.7 C)] 98.1 F (36.7 C) (07/06 0527)  Blood pressure (BP): (120-135)/(54-70) 123/62 (07/06 0527)  Heart Rate:  [77-93] 85 (07/06 0803)  Respirations:  [18-20] 18 (07/06 0527)  Pain Score: 0 (07/06 0527)  O2 Device: Nasal cannula (07/06 0527)  O2 Flow Rate (L/min):  [3 l/min] 3 l/min (07/06 0527)  SpO2:  [97 %-99 %] 98 % (07/06 0527)    Intake/Output Summary (Last 24 hours) at 07/18/16 0807  Last data filed at 07/18/16 0527   Gross per 24 hour   Intake          1777.99 ml   Output             2600 ml   Net          -822.01 ml     Physical Exam  General: NAD, sitting up in chair, comfortable   Chest: L tunneled IJ   CV: RRR, 3/6 systolic TR murmur  Lungs: CTAB  Abd: soft, nontender, mildly distended, pulsatile liver   Ext: trace edema bilateral LEs   Skin: flushing on face/trunk, scattered bruising/ecchymosis on upper extremities, mild petechiae/flolan rash on LEs    Labs  Lab Results   Component Value Date    WBC 5.5 07/18/2016    RBC 4.12 07/18/2016    HGB 10.6 (L) 07/18/2016    HCT 32.2 (L) 07/18/2016    MCV 78.2 (L) 07/18/2016    MCHC 32.9 07/18/2016    RDW 17.5 (H) 07/18/2016    PLT 23 (L) 07/18/2016    PLT 179 09/12/2008    MPV 12.4 06/12/2016     Lab Results   Component Value Date    NA 132 (L) 07/18/2016    K 3.1 (L) 07/18/2016    CL 92 (L) 07/18/2016    BICARB 23 07/18/2016    BUN 132 (H) 07/18/2016    CREAT 1.61 (H) 07/18/2016    GLU 108 (H) 07/18/2016    [place] 10.3 07/18/2016     No results found for: INR, PTT  Uric acid 11  Digoxin 4.4 --> 3.3  ESR 32, CRP 1.70   C3 88, C4 18     Imaging  None new     ASSESSMENT AND  PLAN  Diana Chambers is a 66 year old year old female with 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     # [place]   # Acute on chronic RV failure - Continuing to improve with diuresis, with improved GI symptoms and SOB. Still volume overloaded.   - Increase to lasix 60 mg IV q6H with Diuril prn, goal -1-2L   - Continue PAH therapies (Flolan 35 ng/kg/min and sildenafil 80 mg TID)  - Continue spironolactone 50 mg daily   - Change steroids to prednisone 60 mg daily   - Repeat BNPP in the next few days    # Thrombocytopenia - Platelets stably low. Etiology remains unclear, but most suspicious  for drug-induced or ITP however not much change after holding likely offending agents and starting steroids. No bleeding since admission.   - Continue to hold Uloric and Dapsone   - Ordered peripheral smear  - Change steroids to prednisone 60 mg daily   - Consult heme/onc     # Elevated digoxin - Likely due to poor clearance with AKI. Could also have been contributing to her nausea initially.   - Hold digoxin. Need to clarify with her outpatient pulmonologist regarding restarting   - Repeat digoxin level tomorrow   - Will closely monitor electrolytes     # AKI - Creatinine continuing to improve with diuresis, suggesting due to cardiorenal given significant volume overload. Baseline creatinine 1.0-1.2. UA bland. Will continue diuresing  - Continue diuresis, as above  - Avoid nephrotoxins     # Hyponatremia - 2/2 hypervolemia. Remains hyponatremic, but improving with diuresis.   - Continue to diurese, as above   - BMP BID     # Hypokalemia - Related to diuresis. Repleting as needed.     # CTD, SLE - Stable. Continue steroids, as above     # FEN: low salt  # PPX: contraindicated     Code: DNR/Full care     This patient was seen and discussed with Dr. Rudi Rummage.    Lucy Antigua, MD  Fellow, Pulmonary & Critical Care Medicine    Attending  Attestation:  Patient seen and discussed with the fellow, Dr. Ronny Flurry.  I have examined the patient and reviewed available radiology. I agree with the assessment and plan as outline above. 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  She feels better since admission. Slept poorly last night. Her creatinine has improved to 1.6.  Her platelet count is stable at 23,000. She was -768 ml yesterday.  Exam with flushed skin, tricuspid regurgitation murmur, no S3, clear lungs, pulsatile liver, scant peripheral edema. Agree with plans to continue diuresis Lasix/Diuril with goal 1-2 L negative.  She is diuresing well without need for inotropic support.  Her platelet count is stable and she has no active bleeding so we will continue to follow. Will consult hematology for thrombocytopenia. Can consider DDAVP if bleeding, given uremia. Continue her epoprostenol and sildenafil.  We will reduce steroids to prednisone 60 mg daily.Follow electrolytes b.i.d.; replete potassium as necessary.  As per previous discussion, she remains DNAR-full care.    Kris Hartmann. Rudi Rummage, M.D., M.P.H.  Assistant Clinical Professor of Medicine  Division of Pulmonary and Critical Care Medicine  Pager [phone]# [phone]

## 2016-07-18 NOTE — Plan of Care (Signed)
Problem: Bleeding, Risk of  Goal: Absence of active bleeding    Intervention: Active bleeding signs and symptoms assessment  No active signs of bleeding noted at this time. Pt instructed to notify for any presence of bleeding. Call light within reach.       Problem: Tissue Perfusion, Cardiopulmonary - Altered  Goal: Hemodynamic stability    Intervention: Ensure that hemodynamic monitoring, vital signs, accurately reflects patient's status  Pt stable at this time. Comfortable with 3L NC. VSS. Meds as ordered. Instructed to notify for any issues. Call light within reach. Rounding done.       Problem: Fluid Volume Excess  Goal: Absence of edema; peripheral and/or pulmonary    Intervention: Fluid overload signs and symptoms assessment  Pt with strict I/Os and daily weights ordered. Foley in place. Will continue to monitor.       Problem: Breathing Pattern - Ineffective  Goal: Arterial blood gas values and/or saturation return to baseline    Intervention: Communicate and collaborate with respiratory therapist  Pt with no s/s respiratory issues at this time. Currently comfortable and asleep. Able to perform ADLs. Comfortable on 3L NC. Will continue to monitor.

## 2016-07-18 NOTE — Consults (Signed)
HEMATOLOGY/ONCOLOGY INITIAL CONSULT NOTE    Request for Consultation:   Asked by Majel Homer., MD to evaluate this patient for thrombocytopenia.    History of Present Illness:     Diana Chambers is a 66 year old female who has a history of PAH WHO group 1, CKD, presenting with volume overload and AKI found to have worsening thrombocytopenia.    Patient reports no hematochezia, melena, hematuria.  Notes has had worsened development of ecchymoses the last few weeks with minor/no trauma and some episodes of epistaxis.  States she had been recently started dapsone (while on a prednisone taper) and uloric 1 month ago until 1-2 weeks ago when she stopped both of them.  She has not had any recent heparin/lovenox (last 1 month ago).  Over the past 7 years has had mild thrombocytopenia ~120 range with decline beginning in May 2018, declining sharply over past month.    Reports was admitted for volume overload with steady improvement while inpatient.    Per patient she states she has dry mouth and dry eyes w/ + SSA/SSB as well as +dsDNA.    Review of systems: Remaining 14 point ROS negative unless otherwise stated.     Past Medical and Surgical History:  Past Medical History:   Diagnosis Date    Pulmonary HTN     Sjoegren syndrome (CMS-HCC) 07/12/2009     No past surgical history on file.    Social History:  Social History     Social History    Marital status: Married     Spouse name: N/A    Number of children: N/A    Years of education: N/A     Social History Main Topics    Smoking status: Former Smoker     Packs/day: 0.50     Years: 5.00     Quit date: 01/13/1978    Smokeless tobacco: Never Used    Alcohol use No    Drug use: Not on file    Sexual activity: Not on file     Social Activities of Daily Living Present    Not on file     Social History Narrative       Family History:  Family History   Problem Relation Age of Onset    Heart Disease Mother     Hypertension Mother     Psychiatry Mother        Heart Disease Father        Allergies:  Allergies   Allergen Reactions    Allopurinol Hives    Levaquin Swelling and Other     Severe joint pain      Ofloxacin Nausea and Vomiting    Chlorhexidine Itching    Sulfa Drugs Unspecified       Prior to Admission Medications:  Prescriptions Prior to Admission   Medication Sig Dispense Refill Last Dose    azelastine (ASTELIN) 0.1 % nasal spray Spray 1 spray into each nostril 2 times daily. Use in each nostril as directed 1 bottle 3 Taking    Cholecalciferol 2000 UNITS CAPS Take 1 capsule by mouth daily.   Taking    cyanocobalamin 500 MCG tablet Take 1 tablet (500 mcg) by mouth daily. 30 tablet 3 Taking    dapsone 100 MG tablet Take 1 tablet (100 mg) by mouth daily Indications: Immunosuppression Prophylaxis. 30 tablet 1 Taking    digoxin (LANOXIN) 0.25 MG tablet Take 1 tablet (250 mcg) by mouth every evening. 30 tablet 3  Taking    Estradiol (DIVIGEL) 0.25 MG/0.25GM GEL Apply 0.25 mg topically nightly.   Taking    estradiol (ESTRING) 2 MG vaginal ring Insert 1 Ring vaginally Every 3 months. Follow package directions   Taking    febuxostat (ULORIC) 40 MG TABS Take 1 tablet (40 mg) by mouth daily. 30 tablet 3 Taking    febuxostat (ULORIC) 40 MG TABS Take 1 tablet (40 mg) by mouth daily. 30 tablet 0 Taking    ferrous sulfate 324 (65 FE) MG TBEC Take 1 tablet (324 mg) by mouth daily. 30 tablet 0 Taking    FLOLAN 1.5 MG IV SOLR 54OE/VO/JJK   Taking    FOLIC ACID 093 MCG OR TABS 1 TABLET DAILY   Taking    furosemide (LASIX) 40 MG tablet Take 3 tablets (120 mg) by mouth 3 times daily. 252 tablet 3 Taking    medroxyPROGESTERone (PROVERA) 2.5 MG tablet Take 1.25 mg by mouth daily.   Taking    metolazone (ZAROXOLYN) 5 MG tablet Take 1 tablet (5 mg) by mouth three times a week. Monday, Wednesday, Friday 21 tablet 3 Taking    potassium chloride (KLOR-CON) 10 MEQ tablet Take 4 tablets (40 mEq) by mouth daily. 120 tablet 0 Taking    predniSONE (DELTASONE) 10 MG  tablet Take 4 tablets (40 mg) by mouth daily. Take 24m (4 tablets) on 6/10.  Starting 6/11 take 330mdaily (3 tablets) for 7 days, then on 6/18, take 2084m2 tablets) daily for 7 days, and on 6/25 take 11m28m tablet) daily until you follow up with Dr. PochBettey Mare tablet 0 Taking    sildenafil (REVATIO) 20 MG tablet Take 4 tablets (80 mg) by mouth 3 times daily. 360 tablet 0 Taking    spironolactone (ALDACTONE) 50 MG tablet Take 1 tablet (50 mg) by mouth daily. 30 tablet 0 Taking       Current Inpatient Medications:   azelastine  1 spray BID    cholecalciferol  2,000 Units Daily    ferrous sulfate  324 818Daily    folic acid  1 mg Daily    furosemide  60 mg Q6H    medroxyPROGESTERone  1.25 mg Daily    predniSONE  60 mg Daily    Prostacyclin Cassette Change   Daily    Prostacyclin Ice Pack   Q6H    Prostacyclin Tubing   Once per day on Mon Wed Fri    [START ON 07/21/2016] Prostacylin Batteries   Once per day on Mon    psyllium  2 Wafer Daily    senna  2 tablet HS    sildenafil  80 mg TID    sodium chloride (PF)  3 mL Q8H    sodium chloride (PF)  3 mL Q8H    spironolactone  50 mg Daily      epoprostenol (FLOLAN) infusion 35 ng/kg/min (07/18/16 1415)    sodium chloride      sodium chloride        nalOXone  0.1 mg Q2 Min PRN    sodium chloride (PF)  3 mL PRN    sodium chloride (PF)  3 mL PRN    sodium chloride   Continuous PRN    sodium chloride   Continuous PRN       Physical Exam:  Temperature:  [97.6 F (36.4 C)-98.1 F (36.7 C)] 97.9 F (36.6 C) (07/06 2043)  Blood pressure (BP): (117-131)/(58-66) 123/66 (07/06 2043)  Heart Rate:  [78-93] 87 (07/06  1536)  Respirations:  [16-18] 18 (07/06 1536)  Pain Score: 0 (07/06 1536)  O2 Device: Nasal cannula (07/06 1536)  O2 Flow Rate (L/min):  [3 l/min] 3 l/min (07/06 1536)  SpO2:  [95 %-98 %] 95 % (07/06 1536)  Gen: WDWN female in NAD  HEENT: plethora, PERRL, no e/o petechiae in OP  CV: RRR, soft systolic II/VI murmur  Pulm: mild bibasilar  crackles  Abd: soft, NDNT, palpable spleen tip, liver palpable 1-2 cm below R costophrenic angle  Ext: trace LE edema b/l  Skin: flushing on face/trunk, blanching, scattered ecchymoses on upper extremities, occasional petechiae    Labs and Other Data:    Platelets  - 6/3: 72  - 6/8: 45  - 7/2: 18  - 7/3: 16  - 7/4: 20  - 7/5: 24  - 7/6: 23    Peripheral Smear:    07/15/16: decreased platelet count w/ few large platelets, rare schistocytes, teardrop cells, and acanthocytes    Imaging:    None new    Pathology:    None new    Impression:  Diana Chambers is a 66 year old female who has a history of PAH WHO group 1, CKD, presenting with volume overload and AKI found to have worsening thrombocytopenia.    Patient with mild thrombocytopenia for multiple years may be 2/2 medication use such as lasixvs hypoproliferative process.  However, drop over past month very unlikely 2/2 lasix.  Patient with recent dapsone/uloric use, however has been off both for at least 5 days with no significant improvement in counts.  ITP possible, however very unlikely given nonresponse to high dose steroids for the past 3 days (would expect improvement after 48 hours).  Would defer additional therapies for ITP such as IVIg given low likelihood of aid and would worsen patient's volume status.  Peripheral smear without any concern features (no significant schistocytes, no blasts).    Patient with mild anemia, would favor additional work-up.  May be 2/2 iron deficiency (last iron studies 4-05/2016 with trend toward iron deficiency with lowering MCV), may not be absorbing oral iron.  Will r/o hemolysis with labs, though unlikely.    Would favor a bone marrow biopsy at this time to r/o hypoproliferative bone marrow process.  Continue to hold dapsone and uloric.    Recommendations:  - Please check reticulocyte count, LDH, direct coombs, Tbili/Dili, ferritin, iron studies (TIBC, iron), RBC folate  - Please check platelet autoantibody panel  - If  using steroids to treat ITP, would discontinue or taper off as unlikely to provide a rise in platelets  - Bone marrow biopsy scheduled for Monday at 2 pm  - Avoid anticoagulation at this time unless patient develops significant thrombosis  - Goal platelet transfusion >10 or symptomatic bleeding    Seen and discussed with Dr. Cleora Fleet, MD  Hematology/Oncology Fellow  [phone]

## 2016-07-18 NOTE — Plan of Care (Signed)
Problem: Bleeding, Risk of  Goal: Absence of active bleeding  Outcome: Goal Met  No s/s of active bleeding.  H & H stable.  Plt=23- monitoring for now per MD;  Educated on bleeding precautions.  Verbalized understanding.  Instructed to notify staff if any bleeding occurs.  Continue to monitor.    Problem: Tissue Perfusion, Cardiopulmonary - Altered  Goal: Hemodynamic stability  Outcome: Goal Met  Denies chest pain or palpitations.  Pt NSR on telemetry with first degree AVB.  No s/s of hemodynamic instability- vital signs stable.  No s/s of active bleeding.    Respirations even and unlabored.  RR & SpO2 stable on 3L O2 NC.  Skin warm; cap refill < 3sec; radial and pedal pulses palpable and strong.  Continue to monitor.     Problem: Fluid Volume Excess  Goal: Absence of edema; peripheral and/or pulmonary  Outcome: Goal Met  +1 edema noted in BLE. Lung sounds clear. Receiving Lasix- see MAR with good urine output. Continue to monitor.    Problem: Coping - Ineffective, Patient and Family  Goal: Effective or improved coping  Outcome: Goal Met  Calm and cooperative this shift.  No anxiety noted.  Will continue to monitor.     Problem: Breathing Pattern - Ineffective  Goal: Arterial blood gas values and/or saturation return to baseline  Outcome: Goal Met  Denies difficulty breathing.  Respirations even and unlabored.    RR & SpO2 stable on 3L O2 NC.  Lung sounds clear. On Flolan- see MAR for rate; tolerating well.  Skin warm; tissues pink & well perfused.  Cap refill <3 sec.  Continue to monitor.

## 2016-07-18 NOTE — Interdisciplinary (Signed)
07/18/16 1018   Follow Up/Progress   Is the Patient Ready for Discharge No   Barriers to Discharge Awaiting clinical improvement   Patient/Family/Legal/Surrogate Decision Maker Has Been Given Options And Choice In The Selection of Post-Acute Care Providers Yes   Plan/Interventions Explore needs and options for aftercare, provide referrals   Remains on Flolan Gtt, diuresing well without inotrope. Anticipating dc home to [place] with her husband once medically cleared. Pt had frequent admissions for RHF.

## 2016-07-19 LAB — MDIFF
Bands: 3 % (ref 0–15)
Immature Granulocytes Absolute Manual: 0.2 10*3/uL — ABNORMAL HIGH (ref 0.0–0.1)
Metamyelocytes: 1 %
Myelocytes: 3 %
Number of Cells Counted: 113
Plt Est: DECREASED

## 2016-07-19 LAB — RBC FOLATE, BLOOD
Hct: 32.2
RBC Folate: >1240 ng/mL — ABNORMAL HIGH (ref 209–640)

## 2016-07-19 LAB — BASIC METABOLIC PANEL, BLOOD
Anion Gap: 14 mmol/L (ref 7–15)
Anion Gap: 15 mmol/L (ref 7–15)
BUN: 129 mg/dL — ABNORMAL HIGH (ref 8–23)
BUN: 121 mg/dL — ABNORMAL HIGH (ref 8–23)
Bicarbonate: 28 mmol/L (ref 22–29)
Bicarbonate: 27 mmol/L (ref 22–29)
Calcium: 10.8 mg/dL — ABNORMAL HIGH (ref 8.5–10.6)
Calcium: 10.8 mg/dL — ABNORMAL HIGH (ref 8.5–10.6)
Chloride: 93 mmol/L — ABNORMAL LOW (ref 98–107)
Chloride: 91 mmol/L — ABNORMAL LOW (ref 98–107)
Creatinine: 1.63 mg/dL — ABNORMAL HIGH (ref 0.51–0.95)
Creatinine: 1.53 mg/dL — ABNORMAL HIGH (ref 0.51–0.95)
GFR: 32 mL/min
GFR: 34 mL/min
Glucose: 117 mg/dL — ABNORMAL HIGH (ref 70–99)
Glucose: 305 mg/dL — ABNORMAL HIGH (ref 70–99)
Potassium: 3.2 mmol/L — ABNORMAL LOW (ref 3.5–5.1)
Potassium: 3.8 mmol/L (ref 3.5–5.1)
Sodium: 135 mmol/L — ABNORMAL LOW (ref 136–145)
Sodium: 133 mmol/L — ABNORMAL LOW (ref 136–145)

## 2016-07-19 LAB — MAGNESIUM, BLOOD: Magnesium: 2.6 mg/dL — ABNORMAL HIGH (ref 1.6–2.4)

## 2016-07-19 LAB — CBC WITH DIFF, BLOOD
ANC-Manual Mode: 4.9 10*3/uL (ref 1.6–7.0)
Abs Lymphs: 0.5 10*3/uL — ABNORMAL LOW (ref 0.8–3.1)
Abs Monos: 0.4 10*3/uL (ref 0.2–0.8)
Hct: 32.2 % — ABNORMAL LOW (ref 34.0–45.0)
Hgb: 10.9 gm/dL — ABNORMAL LOW (ref 11.2–15.7)
Lymphocytes: 8 %
MCH: 26.5 pg (ref 26.0–32.0)
MCHC: 33.9 g/dL (ref 32.0–36.0)
MCV: 78.3 um3 — ABNORMAL LOW (ref 79.0–95.0)
Monocytes: 6 %
NRBC: 1 /100 WBC (ref <1)
Plt Count: 24 10*3/uL — ABNORMAL LOW (ref 140–370)
RBC: 4.11 10*6/uL (ref 3.90–5.20)
RDW: 17.7 % — ABNORMAL HIGH (ref 12.0–14.0)
Segs: 79 %
WBC: 6 10*3/uL (ref 4.0–10.0)

## 2016-07-19 LAB — DIGOXIN, BLOOD: Digoxin: 2.2 ng/mL — ABNORMAL HIGH (ref 0.6–1.2)

## 2016-07-19 MED ORDER — POTASSIUM CHLORIDE CRYS CR 10 MEQ OR TBCR
40.00 meq | EXTENDED_RELEASE_TABLET | ORAL | Status: AC
Start: 2016-07-19 — End: 2016-07-19
  Administered 2016-07-19 (×2): 40 meq via ORAL
  Filled 2016-07-19 (×2): qty 4

## 2016-07-19 MED ORDER — METAMUCIL PO WAFR
2.0000 | WAFER | Freq: Two times a day (BID) | ORAL | Status: DC
Start: 2016-07-19 — End: 2016-07-28
  Administered 2016-07-19 – 2016-07-28 (×17): 2 via ORAL
  Filled 2016-07-19 (×6): qty 1
  Filled 2016-07-19: qty 2
  Filled 2016-07-19 (×5): qty 1
  Filled 2016-07-19 (×2): qty 2
  Filled 2016-07-19 (×2): qty 1
  Filled 2016-07-19: qty 2

## 2016-07-19 NOTE — Plan of Care (Signed)
Problem: Bleeding, Risk of  Goal: Absence of active bleeding  Outcome: Goal Met  No s/s of active bleeding.  H & H stable.  Plt=24- monitoring for now per Pulmonary team;  Educated on bleeding precautions.  Verbalized understanding.  Instructed to notify staff if any bleeding occurs.  Continue to monitor.    Problem: Tissue Perfusion, Cardiopulmonary - Altered  Goal: Hemodynamic stability  Outcome: Goal Met  Denies chest pain or palpitations.  Pt NSR on telemetry with first degree AVB.  No s/s of hemodynamic instability- vital signs stable.  No s/s of active bleeding.    Respirations even and unlabored.  RR & SpO2 stable on 3L O2 NC.  Skin warm; cap refill < 3sec; radial and pedal pulses palpable and strong.  Continue to monitor.     Problem: Fluid Volume Excess  Goal: Absence of edema; peripheral and/or pulmonary  Outcome: Goal Met  Trace- +1 edema noted in BLE. Lung sounds clear. Receiving Lasix- see MAR with good urine output. Continue to monitor.    Problem: Coping - Ineffective, Patient and Family  Goal: Effective or improved coping  Outcome: Goal Met  Calm and cooperative this shift.  Worried about low platelets, but in good spirits since appetite is better and had energy to walk today.  Will continue to monitor.     Problem: Breathing Pattern - Ineffective  Goal: Arterial blood gas values and/or saturation return to baseline  Outcome: Goal Met  Denies difficulty breathing.  Respirations even and unlabored.    RR & SpO2 stable on 3L O2 NC.  Lung sounds clear. On Flolan- see MAR for rate; tolerating well.  Skin warm; tissues pink & well perfused.  Cap refill <3 sec.  Continue to monitor.

## 2016-07-19 NOTE — Plan of Care (Signed)
Problem: Bleeding, Risk of  Goal: Absence of active bleeding  Outcome: Progressing toward goal, anticipate improvement over: >48 hours  No signs of active bleeding. IV and central lines remain clean and dry. Note ecchymosis to BUE and petechiae to BLE. See labs and VS. Foley patent and intact. No bleeding. No nose bleed.  Labs in am. Instructed pt on bleeding precautions. Pt verbalized understanding to instructions. Will cont' to monitor and assess.     Results for Diana, Chambers (MRN [id number]) as of 07/19/2016 23:21   Ref. Range 07/19/2016 05:32   Plt Count Latest Ref Range: 140 - 370 1000/mm3 24 (L)     MD aware per RN report and no transfusions at this time. No order.     Problem: Tissue Perfusion, Cardiopulmonary - Altered  Goal: Hemodynamic stability  Outcome: Progressing toward goal, anticipate improvement over: >48 hours  Pt alert and oriented x4. Skin dry and warm to touch. 1+ edema noted to BLE. Pt on tele w/ a heart rhythm of NSR w/ first degree AVB. Pt denies chest pain/pressure/palpitation. Pt able to move all extrem. Instructed pt to call staff if c/o chest pain/pressure/palpitation. Pt verbalized understanding to instructions. Strict I/O's and daily wt. Labs in am. See labs. Call bell within reach of pt. Will cont' to monitor and assess.     Problem: Fluid Volume Excess  Goal: Absence of edema; peripheral and/or pulmonary  Outcome: Progressing toward goal, anticipate improvement over: >48 hours  1+ edema to BLE. Strict I/O's and daily wt. Will cont' to monitor and assess.   Goal: Vital signs within specified parameters  Outcome: Progressing toward goal, anticipate improvement over: >48 hours      Problem: Coping - Ineffective, Patient and Family  Goal: Effective or improved coping  Outcome: Progressing toward goal, anticipate improvement over: >48 hours  Pt and husband remains calm, cooperative and supportive (husband). Will cont' to monitor and assess.     Problem: Breathing Pattern -  Ineffective  Goal: Arterial blood gas values and/or saturation return to baseline  Outcome: Progressing toward goal, anticipate improvement over: >48 hours  Lungs diminished to lower lobes. Pt on O2 at 3 lpm via nc. See VS w/ saturation. Pt sob w/ exertion. Pt on Flolan therapy per MD's order. See MAR. Skin flushed. No c/o n/v/d. Ice packs changed as ordered. Back up CADD pump at bedside. Central line c/d/i and patent. Dressing c/d/i. No kinks. No leakage. No alarms noted. Dosage sheet posted in pt's room. HOB > 30 degrees. Instructed pt to use I.S. Every 1-2 h x10. No respiratory distress noted. Instructed pt to call staff if c/o sob/distress. Pt verbalized understanding to instructions. Call bell within reach of pt. Will cont' to monitor and assess.

## 2016-07-19 NOTE — Progress Notes (Addendum)
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      Subjective:   Diuresed -1.1L yesterday with extra dose of Diuril   Feels good, better than on admission   Walked around the halls and feels well   Anxious about her platelets being low    Objective  Temperature:  [97.6 F (36.4 C)-98 F (36.7 C)] 98 F (36.7 C) (07/07 0804)  Blood pressure (BP): (110-131)/(47-66) 121/61 (07/07 0804)  Heart Rate:  [83-89] 89 (07/07 0804)  Respirations:  [14-20] 20 (07/07 0804)  Pain Score: 0 (07/07 0804)  O2 Device: Nasal cannula (07/07 0804)  O2 Flow Rate (L/min):  [3 l/min] 3 l/min (07/07 0804)  SpO2:  [95 %-98 %] 95 % (07/07 0804)    Intake/Output Summary (Last 24 hours) at 07/19/16 1518  Last data filed at 07/19/16 1000   Gross per 24 hour   Intake              440 ml   Output             2575 ml   Net            -2135 ml     Physical Exam  General: NAD, sitting up in chair, comfortable   Chest: L tunneled IJ   CV: RRR, 3/6 systolic TR murmur  Lungs: CTAB  Abd: soft, nontender, mildly distended, pulsatile liver   Ext: trace edema bilateral LEs   Skin: flushing on face/trunk, scattered bruising/ecchymosis on upper extremities, mild petechiae/flolan rash on LEs    Labs  Lab Results   Component Value Date    WBC 6.0 07/19/2016    RBC 4.11 07/19/2016    HGB 10.9 (L) 07/19/2016    HCT 32.2 (L) 07/19/2016    MCV 78.3 (L) 07/19/2016    MCHC 33.9 07/19/2016    RDW 17.7 (H) 07/19/2016    PLT 24 (L) 07/19/2016    PLT 179 09/12/2008    MPV 12.4 06/12/2016     Lab Results   Component Value Date    NA 135 (L) 07/19/2016    K 3.2 (L) 07/19/2016    CL 93 (L) 07/19/2016    BICARB 28 07/19/2016    BUN 129 (H) 07/19/2016    CREAT 1.63 (H) 07/19/2016    GLU 117 (H) 07/19/2016    [place] 10.8 (H) 07/19/2016     No results found for: INR, PTT  Uric acid 11  Digoxin 4.4 --> 3.3  ESR 32, CRP 1.70   C3 88, C4 18     Imaging  None  new     ASSESSMENT AND PLAN  Diana Chambers is a 66 year old year old female with 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     # [place]   # Acute on chronic RV failure - Continuing to improve with diuresis, with improved GI symptoms and SOB. Still remains volume overloaded.   - Continue lasix 60 mg IV q6H with Diuril prn, goal -1-2L   - Continue PAH therapies (Flolan 35 ng/kg/min and sildenafil 80 mg TID)  - Continue spironolactone 50 mg daily   - Prednisone 60 mg daily. Will start to taper next week  - Repeat BNPP in the next few  days    # Thrombocytopenia - Platelets stably low. Etiology remains unclear; was initially most suspicious for drug-induced or ITP however not much change after holding likely offending agents and starting steroids. No bleeding since admission. Heme/onc concerned for bone marrow process.  - Continue to hold Uloric and Dapsone   - Planning on bone marrow biopsy on Monday   - Heme/onc consulted   - Follow up platelet autoantibody panel     # Elevated digoxin - Likely due to poor clearance with AKI. Could also have been contributing to her nausea initially.   - Hold digoxin. Need to clarify with her outpatient pulmonologist regarding restarting   - Will closely monitor electrolytes     # AKI - Creatinine overall improving with diuresis, suggesting due to cardiorenal. Baseline creatinine 1.0-1.2.   - Continue diuresis, as above  - Avoid nephrotoxins     # Hyponatremia - 2/2 hypervolemia. Improving with diuresis, at 135 today.   - Continue to diurese, as above     # Hypokalemia - Related to diuresis. Repleting as needed. BMP BID.     # CTD, SLE - Stable. Continue steroids, as above     # FEN: low salt  # PPX: contraindicated     Code: DNR/Full care     This patient was seen and discussed with Dr. Lovina Chambers.    Diana Antigua, MD  Fellow, Pulmonary & Critical Care Medicine      Pulmonary Vascular Attending  Addendum       Patient seen and examined with Dr. Ronny Chambers.         More active today, ambulating around 4th floor; generally feels better       Afebrile, BP [id number] systolic with HR 89; O2 sat 95% 3 liters       Lungs clear       P2 accentuation with diastolic murmur; no S3       Trace LE edema    BUN 129, Creat 1.63 (steady), K+3.2, Na+135  WBC 6.0, Hct 32.2, platelets steady at 24,000    Diana Chambers with acute on chronic RV failure, SLE now back on prednisone, renal insufficiency; volume overload; T'penia       Diuresing better at this point; without significant change/worsening in renal numbers       Advance activities as able       Continue PH meds unchanged (sildenafil, epo)       Prednisone    Diana Coin. Bernadene Garside, MD

## 2016-07-19 NOTE — Interdisciplinary (Signed)
Pt has small nose bleed which quickly resolved when pressure was applied. Will continue to monitor.

## 2016-07-19 NOTE — Plan of Care (Signed)
Problem: Bleeding, Risk of  Goal: Absence of active bleeding  Outcome: Goal Met      Problem: Tissue Perfusion, Cardiopulmonary - Altered  Goal: Hemodynamic stability  Outcome: Goal Met  Pt currently diuresing with both lasix and diuril. Pt tolerating well. No events on tele at this time. Will cont to monitor.     Problem: Fluid Volume Excess  Goal: Absence of edema; peripheral and/or pulmonary  Outcome: Progressing toward goal, anticipate improvement over: next 12-24 hours  Pt continues to present with pitting edema +1 to ankles and feet. Pt taking lasix and spot dosing diuril. Pt has not met goal of fluid loss 1-2L.   Goal: Vital signs within specified parameters  Outcome: Goal Met      Problem: Coping - Ineffective, Patient and Family  Goal: Effective or improved coping  Outcome: Progressing toward goal, anticipate improvement over: next 12-24 hours  Pt expresses feelings of frustration and hopelessness regarding her health. RN and patient discuss positive coping techniques. Pt reports that prayer has been helpful to her during difficult times in her life. RN encourages prayer and offers a spiritual care visit. Pt refuses at this time but may be open to the visit at a later time. Husband is very support in care and remains at bedside. Husband brings his wife donuts to cheer her up.     Problem: Breathing Pattern - Ineffective  Goal: Arterial blood gas values and/or saturation return to baseline  Outcome: Goal Met  Pt requires 3L/min NC to meet O2 demands which is the pt at home baseline. Pt reports DOE but is mindful to take frequent rest periods during activity./

## 2016-07-20 LAB — CBC WITH DIFF, BLOOD
ANC-Manual Mode: 4.2 10*3/uL (ref 1.6–7.0)
Abs Lymphs: 0.9 10*3/uL (ref 0.8–3.1)
Abs Monos: 0.3 10*3/uL (ref 0.2–0.8)
Hct: 33 % — ABNORMAL LOW (ref 34.0–45.0)
Hgb: 10.6 gm/dL — ABNORMAL LOW (ref 11.2–15.7)
Lymphocytes: 16 %
MCH: 25.2 pg — ABNORMAL LOW (ref 26.0–32.0)
MCHC: 32.1 g/dL (ref 32.0–36.0)
MCV: 78.6 um3 — ABNORMAL LOW (ref 79.0–95.0)
Monocytes: 5 %
Plt Count: 22 10*3/uL — ABNORMAL LOW (ref 140–370)
RBC: 4.2 10*6/uL (ref 3.90–5.20)
RDW: 18 % — ABNORMAL HIGH (ref 12.0–14.0)
Segs: 70 %
WBC: 5.9 10*3/uL (ref 4.0–10.0)

## 2016-07-20 LAB — GLUCOSE (POCT)
Glucose (POCT): 91 mg/dL (ref 70–99)
Glucose (POCT): 280 mg/dL — ABNORMAL HIGH (ref 70–99)
Glucose (POCT): 259 mg/dL — ABNORMAL HIGH (ref 70–99)
Glucose (POCT): 125 mg/dL — ABNORMAL HIGH (ref 70–99)
Glucose (POCT): 226 mg/dL — ABNORMAL HIGH (ref 70–99)

## 2016-07-20 LAB — BASIC METABOLIC PANEL, BLOOD
Anion Gap: 12 mmol/L (ref 7–15)
Anion Gap: 12 mmol/L (ref 7–15)
BUN: 123 mg/dL — ABNORMAL HIGH (ref 8–23)
BUN: 119 mg/dL — ABNORMAL HIGH (ref 8–23)
Bicarbonate: 28 mmol/L (ref 22–29)
Bicarbonate: 28 mmol/L (ref 22–29)
Calcium: 10.7 mg/dL — ABNORMAL HIGH (ref 8.5–10.6)
Calcium: 10.7 mg/dL — ABNORMAL HIGH (ref 8.5–10.6)
Chloride: 94 mmol/L — ABNORMAL LOW (ref 98–107)
Chloride: 93 mmol/L — ABNORMAL LOW (ref 98–107)
Creatinine: 1.56 mg/dL — ABNORMAL HIGH (ref 0.51–0.95)
Creatinine: 1.36 mg/dL — ABNORMAL HIGH (ref 0.51–0.95)
GFR: 33 mL/min
GFR: 39 mL/min
Glucose: 128 mg/dL — ABNORMAL HIGH (ref 70–99)
Glucose: 119 mg/dL — ABNORMAL HIGH (ref 70–99)
Potassium: 3.5 mmol/L (ref 3.5–5.1)
Potassium: 3.9 mmol/L (ref 3.5–5.1)
Sodium: 134 mmol/L — ABNORMAL LOW (ref 136–145)
Sodium: 133 mmol/L — ABNORMAL LOW (ref 136–145)

## 2016-07-20 LAB — EMMI , DIABETES, TYPE 2: EMMI Video Order Number: 10070964340

## 2016-07-20 LAB — MAGNESIUM, BLOOD: Magnesium: 2.4 mg/dL (ref 1.6–2.4)

## 2016-07-20 LAB — MDIFF
Bands: 1 % (ref 0–15)
Immature Granulocytes Absolute Manual: 0.5 10*3/uL — ABNORMAL HIGH (ref 0.0–0.1)
Metamyelocytes: 2 %
Myelocytes: 6 %
Number of Cells Counted: 110
Plt Est: DECREASED

## 2016-07-20 LAB — PHOSPHORUS, BLOOD: Phosphorous: 2.6 mg/dL — ABNORMAL LOW (ref 2.7–4.5)

## 2016-07-20 LAB — GLYCOSYLATED HGB(A1C), BLOOD: Glyco Hgb (A1C): 5.5 % (ref 4.8–5.8)

## 2016-07-20 MED ORDER — GLUCOSE 4 GM PO CHEW (CUSTOM)
4.0000 | CHEWABLE_TABLET | ORAL | Status: DC | PRN
Start: 2016-07-20 — End: 2016-07-28

## 2016-07-20 MED ORDER — DEXTROSE (DIABETIC USE) 40 % OR GEL
1.0000 | ORAL | Status: DC | PRN
Start: 2016-07-20 — End: 2016-07-28

## 2016-07-20 MED ORDER — INSULIN LISPRO (HUMAN) 100 UNIT/ML SC SOLN (CUSTOM)
1.0000 [IU] | Freq: Four times a day (QID) | INTRAMUSCULAR | Status: DC
Start: 2016-07-20 — End: 2016-07-28
  Administered 2016-07-20: 5 [IU] via SUBCUTANEOUS
  Administered 2016-07-20: 2 [IU] via SUBCUTANEOUS
  Administered 2016-07-21: 5 [IU] via SUBCUTANEOUS
  Administered 2016-07-21: 1 [IU] via SUBCUTANEOUS
  Administered 2016-07-21: 3 [IU] via SUBCUTANEOUS
  Administered 2016-07-22 (×2): 1 [IU] via SUBCUTANEOUS
  Administered 2016-07-22: 5 [IU] via SUBCUTANEOUS
  Administered 2016-07-23: 2 [IU] via SUBCUTANEOUS
  Administered 2016-07-23: 3 [IU] via SUBCUTANEOUS
  Administered 2016-07-23: 2 [IU] via SUBCUTANEOUS
  Administered 2016-07-24: 3 [IU] via SUBCUTANEOUS
  Administered 2016-07-25: 1 [IU] via SUBCUTANEOUS
  Administered 2016-07-25: 2 [IU] via SUBCUTANEOUS
  Administered 2016-07-26 – 2016-07-27 (×2): 1 [IU] via SUBCUTANEOUS
  Filled 2016-07-20: qty 5
  Filled 2016-07-20: qty 3
  Filled 2016-07-20 (×2): qty 1
  Filled 2016-07-20: qty 5
  Filled 2016-07-20: qty 1
  Filled 2016-07-20: qty 3
  Filled 2016-07-20: qty 1
  Filled 2016-07-20 (×2): qty 2
  Filled 2016-07-20 (×2): qty 1
  Filled 2016-07-20: qty 2
  Filled 2016-07-20: qty 3
  Filled 2016-07-20: qty 5
  Filled 2016-07-20: qty 2

## 2016-07-20 MED ORDER — POTASSIUM CHLORIDE CRYS CR 10 MEQ OR TBCR
40.00 meq | EXTENDED_RELEASE_TABLET | Freq: Once | ORAL | Status: AC
Start: 2016-07-20 — End: 2016-07-20
  Administered 2016-07-20: 40 meq via ORAL
  Filled 2016-07-20: qty 4

## 2016-07-20 MED ORDER — DEXTROSE 50 % IV SOLN
12.5000 g | INTRAVENOUS | Status: DC | PRN
Start: 2016-07-20 — End: 2016-07-28

## 2016-07-20 MED ORDER — GLUCAGON HCL (RDNA) 1 MG IJ SOLR
1.0000 mg | Freq: Once | INTRAMUSCULAR | Status: DC | PRN
Start: 2016-07-20 — End: 2016-07-28

## 2016-07-20 NOTE — Plan of Care (Signed)
Problem: Tissue Perfusion, Cardiopulmonary - Altered  Goal: Hemodynamic stability  Outcome: Goal Met  VSS. SR on tele with 1st degree block. Flolan infusing as ordered. Dose verified with 2nd RN.  Backup CADD pump at bedside.     Problem: Fluid Volume Excess  Goal: Absence of edema; peripheral and/or pulmonary  Outcome: Progressing toward goal, anticipate improvement over: >48 hours  Crackles noted to lungs, no LE edema noted.  Foley catheter in place with strict I&Os and daily weights monitored.    Problem: Coping - Ineffective, Patient and Family  Goal: Effective or improved coping  Outcome: Progressing toward goal, anticipate improvement over: >48 hours  Pt expressed a lot of concern regarding BG monitoring. Pt tearful at times stating "I just don't need another thing to worry about" Education provided to patient by RN and MD regarding possible causes of hyperglycemia and BG monitoring.  Pt and family verbalized understanding.

## 2016-07-20 NOTE — Progress Notes (Signed)
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      Subjective:   Diuresed -1.6L yesterday without additional diuril   Continues to feel better every day   Walked around 3x in the halls. Felt a little tired with that   Eating well, no nausea/vomiting     Objective  Temperature:  [97.7 F (36.5 C)-98.1 F (36.7 C)] 98.1 F (36.7 C) (07/08 0525)  Blood pressure (BP): (111-138)/(54-64) 118/61 (07/08 0525)  Heart Rate:  [88-98] 88 (07/08 0525)  Respirations:  [18-20] 18 (07/08 0525)  Pain Score: 0 (07/08 0525)  O2 Device: Nasal cannula (07/08 0525)  O2 Flow Rate (L/min):  [3 l/min] 3 l/min (07/08 0525)  SpO2:  [95 %-97 %] 96 % (07/08 0525)    Intake/Output Summary (Last 24 hours) at 07/20/16 0739  Last data filed at 07/20/16 0525   Gross per 24 hour   Intake              960 ml   Output             2650 ml   Net            -1690 ml     Physical Exam  General: NAD, sitting up in chair, comfortable   Chest: L tunneled IJ   CV: RRR, 3/6 systolic TR murmur, JVP ~6EV at 20 degrees   Lungs: CTAB  Abd: soft, nontender, mildly distended  Ext: trace edema bilateral LEs   Skin: flushing on face/trunk, scattered bruising/ecchymosis on upper extremities, mild petechiae/flolan rash on LEs    Labs  Lab Results   Component Value Date    WBC 5.9 07/20/2016    RBC 4.20 07/20/2016    HGB 10.6 (L) 07/20/2016    HCT 33.0 (L) 07/20/2016    MCV 78.6 (L) 07/20/2016    MCHC 32.1 07/20/2016    RDW 18.0 (H) 07/20/2016    PLT 22 (L) 07/20/2016    PLT 179 09/12/2008    MPV 12.4 06/12/2016     Lab Results   Component Value Date    NA 134 (L) 07/20/2016    K 3.5 07/20/2016    CL 94 (L) 07/20/2016    BICARB 28 07/20/2016    BUN 123 (H) 07/20/2016    CREAT 1.56 (H) 07/20/2016    GLU 128 (H) 07/20/2016    [place] 10.7 (H) 07/20/2016     No results found for: INR, PTT  Uric acid 11  Digoxin 4.4 --> 3.3  ESR 32, CRP 1.70   C3 88, C4  18     Imaging  None new     ASSESSMENT AND PLAN  Diana Chambers is a 66 year old year old female with 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     # [place]   # Acute on chronic RV failure - Continuing to improve with diuresis, with improved GI symptoms and SOB. Getting closer to euvolemia.  - Continue lasix 60 mg IV q6H with Diuril prn, goal -1L   - Continue PAH therapies (epoprostenol 35 ng/kg/min and sildenafil 80 mg TID)  - Continue spironolactone 50 mg daily   - Prednisone 60 mg daily (7/6-). Will start to taper next week  - Repeat BNPP  tomorrow    # Thrombocytopenia - Platelets remain low. Etiology remains unclear; was initially most suspicious for drug-induced or ITP however not much change after holding likely offending agents and starting steroids. No bleeding since admission. Heme/onc concerned for bone marrow process.  - Continue to hold Uloric and Dapsone   - Planning on bone marrow biopsy tomorrow  - Heme/onc consulted   - Follow up platelet autoantibody panel     # Elevated digoxin - Likely due to poor clearance with AKI. Could also have been contributing to her nausea initially.   - Hold digoxin. Need to clarify with her outpatient pHTN pulmonologist regarding restarting   - Will closely monitor electrolytes     # AKI - Creatinine overall improving with diuresis, suggesting due to cardiorenal. Baseline creatinine 1.0-1.2. Has been stable around 1.5 for last few days.   - Continue diuresis, as above  - Avoid nephrotoxins     # Hyponatremia - 2/2 hypervolemia. Improving overall with diuresis.   - Continue to diurese, as above     # Hypokalemia - Related to diuresis. Repleting as needed. BMP BID.     # CTD, SLE - Stable. Continue steroids, as above       # FEN: low salt  # PPX: contraindicated       Code: DNR/Full care       This patient was seen and discussed with Dr. Auger.    Jenny Yang, MD  Fellow, Pulmonary &  Critical Care Medicine    Pulmonary Vascular Attending Addendum       Patient seen and examined with Dr. Yang.         No new events overnight; OOB to chair for several hours yesterday       In good spirits and overall feels better with diuresis         Afebrile; BP 120s-130s systolic with HR 80s; O2 sat 96-98% 3 liters       Lungs with left basilar inspiratory crackles       With HR slower this AM, systolic murmur with P2 accentuation, third heart sound       Abdomen distended (ascites) NT       Trace bilateral ankle edema    Negative 1.7 liters yesterday  BUN 123, Creat 1.56 (stable), K+3.5, Na+134 (stable); an gap down to 12    WBC 5.9, Hct 33.0, platelets steady at 22,000    Platelet AB studies in progress    PAH with acute on chronic RV failure, SLE now back on prednisone, renal insufficiency; volume overload; T'penia      Clinically better, and now diuresing with greater effectiveness (no diuril) on every 6 hours lasix and aldactone      No change in PH meds      Primary team to dictate timing of steroid taper    William R. Auger, MD

## 2016-07-20 NOTE — Interdisciplinary (Signed)
MD paged to [phone]  Re: Pt Diana Chambers, Diana Chambers in rm 416. Labs: K 3.5 this am; pt on Lasix IV. Want K replacement? Platelet count 22,000. Awaiting.

## 2016-07-20 NOTE — Plan of Care (Signed)
Problem: Bleeding, Risk of  Goal: Absence of active bleeding  Outcome: Progressing toward goal, anticipate improvement over: >48 hours  No signs of active bleeding. IV and central lines remain clean and dry. Note ecchymosis to BUE and petechiae to BLE. See labs and VS. Foley patent and intact. No bleeding. No nose bleed.  Labs in am. Instructed pt on bleeding precautions. Pt verbalized understanding to instructions. Will cont' to monitor and assess.     Results for RUBENA, ROSEMAN (MRN [id number]) as of 07/20/2016 22:57   Ref. Range 07/20/2016 05:28   Plt Count Latest Ref Range: 140 - 370 1000/mm3 22 (L)       MD aware per RN report and no transfusions at this time. No order.     Problem: Tissue Perfusion, Cardiopulmonary - Altered  Goal: Hemodynamic stability  Outcome: Progressing toward goal, anticipate improvement over: >48 hours  Pt alert and oriented x4. Skin dry and warm to touch. 1+ edema noted to BLE. Pt on tele w/ a heart rhythm of NSR w/ first degree AVB. Pt denies chest pain/pressure/palpitation. Pt able to move all extrem. Instructed pt to call staff if c/o chest pain/pressure/palpitation. Pt verbalized understanding to instructions. Strict I/O's and daily wt. Labs in am. See labs. Call bell within reach of pt. Will cont' to monitor and assess.     Problem: Fluid Volume Excess  Goal: Absence of edema; peripheral and/or pulmonary  Outcome: Progressing toward goal, anticipate improvement over: >48 hours  1+ edema to BLE. Strict I/O's and daily wt. Will cont' to monitor and assess.   Goal: Vital signs within specified parameters  Outcome: Progressing toward goal, anticipate improvement over: >48 hours      Problem: Coping - Ineffective, Patient and Family  Goal: Effective or improved coping  Outcome: Progressing toward goal, anticipate improvement over: >48 hours  Pt and husband remains calm, cooperative and supportive (husband). Will cont' to monitor and assess.     Problem: Breathing Pattern -  Ineffective  Goal: Arterial blood gas values and/or saturation return to baseline  Outcome: Progressing toward goal, anticipate improvement over: >48 hours  Lungs coarse to LLL and diminished RLL lobe. Pt on O2 at 3 lpm via nc. See VS w/ saturation. Pt sob w/ exertion. Pt on Flolan therapy per MD's order. See MAR. Skin flushed. No c/o n/v/d. Ice packs changed as ordered. Back up CADD pump at bedside. Central line c/d/i and patent. Dressing c/d/i. No kinks. No leakage. No alarms noted. Dosage sheet posted in pt's room. HOB > 30 degrees. Instructed pt to use I.S. Every 1-2 h x10. No respiratory distress noted. Instructed pt to call staff if c/o sob/distress. Pt verbalized understanding to instructions. Call bell within reach of pt. Will cont' to monitor and assess.

## 2016-07-21 DIAGNOSIS — D649 Anemia, unspecified: Secondary | ICD-10-CM

## 2016-07-21 DIAGNOSIS — D469 Myelodysplastic syndrome, unspecified: Secondary | ICD-10-CM

## 2016-07-21 LAB — BASIC METABOLIC PANEL, BLOOD
Anion Gap: 12 mmol/L (ref 7–15)
Anion Gap: 13 mmol/L (ref 7–15)
BUN: 114 mg/dL — ABNORMAL HIGH (ref 8–23)
BUN: 110 mg/dL — ABNORMAL HIGH (ref 8–23)
Bicarbonate: 29 mmol/L (ref 22–29)
Bicarbonate: 27 mmol/L (ref 22–29)
Calcium: 10.7 mg/dL — ABNORMAL HIGH (ref 8.5–10.6)
Calcium: 10.3 mg/dL (ref 8.5–10.6)
Chloride: 95 mmol/L — ABNORMAL LOW (ref 98–107)
Chloride: 94 mmol/L — ABNORMAL LOW (ref 98–107)
Creatinine: 1.46 mg/dL — ABNORMAL HIGH (ref 0.51–0.95)
Creatinine: 1.37 mg/dL — ABNORMAL HIGH (ref 0.51–0.95)
GFR: 36 mL/min
GFR: 39 mL/min
Glucose: 85 mg/dL (ref 70–99)
Glucose: 253 mg/dL — ABNORMAL HIGH (ref 70–99)
Potassium: 3.2 mmol/L — ABNORMAL LOW (ref 3.5–5.1)
Potassium: 4.1 mmol/L (ref 3.5–5.1)
Sodium: 136 mmol/L (ref 136–145)
Sodium: 134 mmol/L — ABNORMAL LOW (ref 136–145)

## 2016-07-21 LAB — MDIFF
Bands: 4 % (ref 0–15)
Immature Granulocytes Absolute Manual: 0.3 10*3/uL — ABNORMAL HIGH (ref 0.0–0.1)
Myelocytes: 4 %
Number of Cells Counted: 115
Plt Est: DECREASED

## 2016-07-21 LAB — GLUCOSE (POCT)
Glucose (POCT): 96 mg/dL (ref 70–99)
Glucose (POCT): 174 mg/dL — ABNORMAL HIGH (ref 70–99)
Glucose (POCT): 266 mg/dL — ABNORMAL HIGH (ref 70–99)
Glucose (POCT): 252 mg/dL — ABNORMAL HIGH (ref 70–99)

## 2016-07-21 LAB — CBC WITH DIFF, BLOOD
ANC-Manual Mode: 4.8 10*3/uL (ref 1.6–7.0)
Abs Lymphs: 1.1 10*3/uL (ref 0.8–3.1)
Abs Monos: 0.3 10*3/uL (ref 0.2–0.8)
Hct: 34 % (ref 34.0–45.0)
Hgb: 10.9 gm/dL — ABNORMAL LOW (ref 11.2–15.7)
Lymphocytes: 17 %
MCH: 25.1 pg — ABNORMAL LOW (ref 26.0–32.0)
MCHC: 32.1 g/dL (ref 32.0–36.0)
MCV: 78.3 um3 — ABNORMAL LOW (ref 79.0–95.0)
Monocytes: 4 %
NRBC: 1 /100 WBC (ref <1)
Plt Count: 20 10*3/uL — ABNORMAL LOW (ref 140–370)
RBC: 4.34 10*6/uL (ref 3.90–5.20)
RDW: 18 % — ABNORMAL HIGH (ref 12.0–14.0)
Segs: 71 %
WBC: 6.4 10*3/uL (ref 4.0–10.0)

## 2016-07-21 LAB — PHOSPHORUS, BLOOD: Phosphorous: 3.4 mg/dL (ref 2.7–4.5)

## 2016-07-21 LAB — VITAMIN B12, BLOOD: Vitamin B12: 450 pg/mL (ref 232–1245)

## 2016-07-21 LAB — PRO BNP, BLOOD: BNPP: 10697 pg/mL — ABNORMAL HIGH (ref 0–899)

## 2016-07-21 LAB — MAGNESIUM, BLOOD: Magnesium: 2.3 mg/dL (ref 1.6–2.4)

## 2016-07-21 LAB — DIGOXIN, BLOOD: Digoxin: 1.4 ng/mL — ABNORMAL HIGH (ref 0.6–1.2)

## 2016-07-21 MED ORDER — PREDNISONE 20 MG OR TABS
40.0000 mg | ORAL_TABLET | Freq: Every day | ORAL | Status: DC
Start: 2016-07-22 — End: 2016-07-28
  Administered 2016-07-22 – 2016-07-28 (×7): 40 mg via ORAL
  Filled 2016-07-21 (×7): qty 2

## 2016-07-21 MED ORDER — HEPARIN SODIUM (PORCINE) 1000 UNIT/ML IJ SOLN (CUSTOM)
10000.0000 [IU] | Freq: Once | INTRAMUSCULAR | Status: AC
Start: 2016-07-21 — End: 2016-07-22
  Filled 2016-07-21: qty 10

## 2016-07-21 MED ORDER — POTASSIUM CHLORIDE CRYS CR 10 MEQ OR TBCR
40.00 meq | EXTENDED_RELEASE_TABLET | Freq: Once | ORAL | Status: AC
Start: 2016-07-21 — End: 2016-07-21
  Administered 2016-07-21: 40 meq via ORAL
  Filled 2016-07-21: qty 4

## 2016-07-21 MED ORDER — LIDOCAINE HCL 2 % IJ SOLN
10.0000 mL | Freq: Once | INTRAMUSCULAR | Status: AC
Start: 2016-07-21 — End: 2016-07-21
  Administered 2016-07-21: 10 mL via INTRADERMAL
  Filled 2016-07-21: qty 10

## 2016-07-21 NOTE — Procedures (Signed)
Bone Marrow Biopsy and Aspiration Procedure Note    Procedure: Bone marrow biopsy and aspiration  Procedure indication:  thrombocytopenia  Primary operator:  Kriste Basque, MD  Other participants: Debbe Mounts, MD    Description of procedure:   Time-out was called to confirm: patients name and date of birth, procedure, side and site of biopsy, safety procedures followed. Informed consent: signed by patient.     - Aspiration and biopsy site: right superior posterior iliac crest. Unable to successfully aspirate bone marrow.  - Patient position: prone  - Preparation and technique: sterile preparation of site with [place], draped to expose aspirate/biopsy area, local anesthesia with 2% lidocaine (approximately 62m), frequent pressure application on incision to maintain hemostasis. - Tissue obtained were successfully obtained in sterile manner:   Bone marrow aspirate [ 0 ml]      Core biopsy 2x 1 cm, 1x 2cm  - The specimen was sent for: hematopathology review, flow cytometry, FISH, cytogenetics, and molecular studies   - Toleration of procedure: slight localized bleeding (<176m. Patient tolerated procedure well with minimal pain.  -Complications of procedure: None   -Procedure follow-up: Placed a pressure dressing, asked the pt to lie flat on his back, putting pressure on the area for at least 1 hour.    NiKriste BasqueMD  Hematology/Oncology Fellow    Discussed and performed with attending Dr. voNorval Morton

## 2016-07-21 NOTE — Plan of Care (Signed)
Problem: Breathing Pattern - Ineffective  Goal: Arterial blood gas values and/or saturation return to baseline  Outcome: Progressing toward goal, anticipate improvement over: >48 hours  Pt is on  3LNC and maintaining O2sat above 92%. Pt denies shortness of breath. Continue to monitor.

## 2016-07-21 NOTE — Progress Notes (Signed)
Hematology Oncology Daily Progress Note:  07/21/16    Current Hospital Stay:   6 Days 18 Hours    24 Hr Events:  - Continue IV diuresis (1.2L)  - Platelets stably decreased ~20    Subjective:  Patient reports feeling overall well.  Ambulating without difficulty.  Continued with diuresis.  Has bruising but noted no new bleeding (no hematochezia, melena, hematuria).    Objective:   epoprostenol (FLOLAN) infusion 35 ng/kg/min (07/21/16 1330)    sodium chloride      sodium chloride        Current Facility-Administered Medications   Medication    azelastine (ASTELIN) 0.1 % nasal spray 1 spray    cholecalciferol (VITAMIN D) tablet 2,000 Units    dextrose 50 % solution 12.5 g    epoprostenol (FLOLAN) 60,000 ng/mL in glycine diluent 100 mL infusion    ferrous sulfate EC tablet 761 mg    folic acid (FOLVITE) tablet 1 mg    furosemide (LASIX) injection 60 mg    glucagon (GLUCAGON) injection 1 mg    glucose chewable tablet 16 g    glucose oral gel 1 Tube    heparin injection 10,000 Units    insulin lispro (HUMALOG) injection 1-10 Units    lidocaine 2% injection 10 mL    medroxyPROGESTERone (PROVERA) tablet 1.25 mg    nalOXone (NARCAN) injection 0.1 mg    [START ON 07/22/2016] predniSONE (DELTASONE) tablet 40 mg    Prostacyclin Cassette Change    Prostacyclin Ice Pack    Prostacyclin Tubing    Prostacylin Batteries    psyllium (METAMUCIL) wafer 2 Wafer    senna (SENOKOT) tablet 17.2 mg    sildenafil (REVATIO) tablet 80 mg **patient own medication**    sodium chloride (PF) 0.9 % flush 3 mL    sodium chloride (PF) 0.9 % flush 3 mL    sodium chloride (PF) 0.9 % flush 3 mL    sodium chloride (PF) 0.9 % flush 3 mL    sodium chloride 0.9 % TKO infusion    sodium chloride 0.9 % TKO infusion    spironolactone (ALDACTONE) tablet 50 mg       Vital Signs:  Temperature:  [97.8 F (36.6 C)-98.3 F (36.8 C)] 97.8 F (36.6 C) (07/09 1545)  Blood pressure (BP): (118-138)/(52-74) 129/60 (07/09 1545)  Heart  Rate:  [85-92] 85 (07/09 1545)  Respirations:  [18-24] 18 (07/09 1545)  Pain Score: 0 (07/09 1545)  O2 Device: Nasal cannula (07/09 1545)  O2 Flow Rate (L/min):  [3 l/min] 3 l/min (07/09 1545)  SpO2:  [96 %-98 %] 96 % (07/09 1545)  07/08 0600 - 07/09 0559  In: 1211.7 [P.O.:994; I.V.:217.7]  Out: 2400 [Urine:2400]  Date Weight Recorded 07/21/2016 07/20/2016 07/19/2016 07/18/2016 07/17/2016 07/16/2016   Metric 62.8 kg 62.8 kg 62.2 kg 62.4 kg 63 kg 62.6 kg   Pounds/Ounces 138 lb 7.2 oz 138 lb 7.2 oz 137 lb 2 oz 137 lb 9.1 oz 138 lb 14.2 oz 138 lb 0.1 oz        Physical Exam:  Gen: WDWN female in NAD, NC in place  HEENT: plethora, PERRL, no e/o petechiae in OP  CV: RRR, soft systolic II/VI murmur  Pulm: CTAB  Abd: soft, NDNT, palpable spleen tip, liver palpable 1-2 cm below R costophrenic angle  Ext: trace LE edema b/l  Skin: flushing on face/trunk, blanching, scattered ecchymoses on upper extremities    Laboratory data:   Recent Labs  07/18/16   1740  07/19/16   0532   07/20/16   0528  07/20/16   1800  07/21/16   0539   NA  132*  135*   < >  134*  133*  136   K  3.2*  3.2*   < >  3.5  3.9  3.2*   CL  89*  93*   < >  94*  93*  95*   BICARB  26  28   < >  28  28  29    BUN  126*  129*   < >  123*  119*  114*   CREAT  1.59*  1.63*   < >  1.56*  1.36*  1.46*   [place]  10.4  10.8*   < >  10.7*  10.7*  10.7*   MG   --   2.6*   --   2.4   --   2.3   PHOS   --    --    --   2.6*   --   3.4   TBILI  0.46   --    --    --    --    --    DBILI  <0.2   --    --    --    --    --     < > = values in this interval not displayed.     Recent Labs      07/19/16   0532  07/20/16   0528  07/21/16   0539   WBC  6.0  5.9  6.4   HGB  10.9*  10.6*  10.9*   HCT  32.2*  33.0*  34.0   MCV  78.3*  78.6*  78.3*   PLT  24*  22*  20*   SEG  79  70  71   LYMPHS  8  16  17    MONOS  6  5  4      No results for input(s): PTT, INR in the last 72 hours.    Imaging:  None new    Assessment   Diana Chambers is a 66 year old female who has a history of PAH WHO  group 1, CKD, presenting with volume overload and AKI found to have worsening thrombocytopenia.    Patient with mild thrombocytopenia for multiple years may be 2/2 medication use such as lasixvs hypoproliferative process.  However, drop over past month very unlikely 2/2 lasix.  Patient with recent dapsone/uloric use, however has been off both for at least 5 days with no significant improvement in counts.  Uloric with few if any reports of thrombocytopenia and dapsone with scattered reports.  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  Would defer additional therapies for ITP such as IVIg given low likelihood of aid and would worsen patient's volume status.  Peripheral smear without any concern features (no significant schistocytes, no blasts).    So far patient's anemia work up without significant results.  Ferritin has improved and %sat improved on PO iron.  Vitamin B12 borderline low, will repeat.  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    Recommend bone marrow biopsy, planned today.    Recommendations:  - Bone marrow today, f/u results  - F/u vitamin B12    Seen and discussed with attending, Dr. Norval Morton.    Kriste Basque, MD  Hematology/Oncology Fellow  Pager [phone]  Patient seen and examined with fellow. Agree with history, physical exam, findings, assessment and plan.    In addition: The thrombocytopenia could be drug-related. Please send lab misc: drug-dependent anti-platelet antibodies to uloric. This needs to be ruled out. Of concern is progressive anemia (microcytic/hypochromic) , speaking for iron deficiency anemia, although ferritin appears normal. I suspct this patient to be in an inflammatory state, whereby ferritin may not be the best parameter to assess iron state. Please send lab misc: soluble transferrin receptor. Also , we will request marrow iron stains. If iron deficient, will need investigate why.    Otherwise she received bone marrow biopsy today by fellow Dr. Druscilla Brownie. Dry tap, but 3 pieces of  marrow were sent for analysis. MDS is in the differential diagnosis.     Debbe Mounts, MD Hematology Attending

## 2016-07-21 NOTE — Plan of Care (Signed)
Problem: Fluid Volume Excess  Goal: Absence of edema; peripheral and/or pulmonary  Outcome: Progressing toward goal, anticipate improvement over: Next 24-48 hours  Fluid status was negative 1200 over 24 hours. Pt denies shortness of breaths. Pt is on 3LNC and maintains O2sat above 92%.  Pt continue to take lasix 60mg  IV Q6H. Continue to monitor.

## 2016-07-21 NOTE — Plan of Care (Signed)
Problem: Tissue Perfusion, Cardiopulmonary - Altered  Goal: Hemodynamic stability  Outcome: Progressing toward goal, anticipate improvement over: Next 24-48 hours  VSS. Pt is on Flolan 35ng/kg/min. Pt reported that the pt feels dizzy when she stands up but because of it the pt always moves slowly with her husband. Pt walked on the hallway withou any signs of dizziness or shortness of breath. Continue to monitor.

## 2016-07-21 NOTE — Plan of Care (Signed)
Problem: Bleeding, Risk of  Goal: Absence of active bleeding  Outcome: Progressing toward goal, anticipate improvement over: >48 hours  PLT is down to 20 and pt had bone marrow biopsy today. No signs or symptoms of bleeding. Continue to monitor.

## 2016-07-21 NOTE — Progress Notes (Addendum)
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      Subjective:   Diuresed -1.2L without any additional Diuril   Feeling better, eating well  Walked around the halls 4x yesterday   Eager to get the bone marrow biopsy     Objective  Temperature:  [98 F (36.7 C)-98.3 F (36.8 C)] 98 F (36.7 C) (07/09 1119)  Blood pressure (BP): (118-138)/(52-74) 134/62 (07/09 1119)  Heart Rate:  [84-92] 85 (07/09 1119)  Respirations:  [18-24] 18 (07/09 1119)  Pain Score: 0 (07/09 1119)  O2 Device: Nasal cannula (07/09 1119)  O2 Flow Rate (L/min):  [3 l/min] 3 l/min (07/09 1119)  SpO2:  [96 %-98 %] 98 % (07/09 1119)      Intake/Output Summary (Last 24 hours) at 07/21/16 1311  Last data filed at 07/21/16 1153   Gross per 24 hour   Intake          1199.14 ml   Output             2550 ml   Net         -1350.86 ml     Physical Exam  General: NAD, lying in bed, comfortable  Chest: L tunneled IJ   CV: RRR, 3/6 systolic TR murmur  Lungs: CTAB  Abd: soft, nontender, mildly distended  Ext: trace edema bilateral LEs   Skin: flushing on face/trunk, scattered bruising/ecchymosis on upper extremities, improved mild petechiae/flolan rash on LEs    Labs  Lab Results   Component Value Date    WBC 6.4 07/21/2016    RBC 4.34 07/21/2016    HGB 10.9 (L) 07/21/2016    HCT 34.0 07/21/2016    MCV 78.3 (L) 07/21/2016    MCHC 32.1 07/21/2016    RDW 18.0 (H) 07/21/2016    PLT 20 (L) 07/21/2016    PLT 179 09/12/2008    MPV 12.4 06/12/2016     Lab Results   Component Value Date    NA 136 07/21/2016    K 3.2 (L) 07/21/2016    CL 95 (L) 07/21/2016    BICARB 29 07/21/2016    BUN 114 (H) 07/21/2016    CREAT 1.46 (H) 07/21/2016    GLU 85 07/21/2016    [place] 10.7 (H) 07/21/2016     No results found for: INR, PTT  Uric acid 11  ESR 32, CRP 1.70   C3 88, C4 18     Imaging  None new     ASSESSMENT AND PLAN  Diana Chambers is a 66 year old  year old female with 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     # [place]   # Acute on chronic RV failure - Continuing to improve with diuresis, with improved GI symptoms and SOB. Getting closer to euvolemia.  - Continue lasix 60 mg IV q6H with Diuril prn, goal -1L. Has not required Diuril past two days   - Continue PAH therapies (epoprostenol 35 ng/kg/min and sildenafil 80 mg TID)  - Continue spironolactone 50 mg daily   - Reduce to Prednisone 40 mg daily     # Thrombocytopenia - Platelets remain low. Etiology remains unclear; was initially most suspicious for drug-induced or ITP however not much change after holding  likely offending agents and starting steroids. No bleeding since admission. Heme/onc concerned for bone marrow process.  - Continue to hold Uloric and Dapsone   - Bone marrow biopsy today  - Heme/onc following  - Follow up platelet autoantibody panel   - Continue prednisone    # Elevated digoxin - Likely due to poor clearance with AKI. Could also have been contributing to her nausea initially.   - Hold digoxin for now. Will discuss need to restart prior to discharge  - Will closely monitor electrolytes     # AKI - Creatinine overall improving with diuresis, suggesting due to cardiorenal. Baseline creatinine 1.0-1.2.  - Continue diuresis, as above  - Avoid nephrotoxins     # Elevated glucose - Due to high-dose steroids.   - SSI     # Hyponatremia - 2/2 hypervolemia. Improving overall with diuresis.   - Continue to diurese, as above     # Hypokalemia - Related to diuresis. Repleting as needed. BMP BID.     # CTD, SLE - Stable. Continue steroids, as above     # FEN: low salt  # PPX: contraindicated     Code: DNR/Full care     This patient was seen and discussed with Dr. [name].    Lucy Antigua, MD  Fellow, Pulmonary & Critical Care Medicine      Pulmonary Vascular Attending Addendum:  Patient was examined with Dr.  Nelida Meuse, imaging and tests reviewed.  Please see above note for full details and plan.    66 yo F with WHO I PAH associated with prior anorexogen exposure and CTD.  Admitted with acute on chronic right heart failure, volume overload, AKI and thrombocytopenia.    -Continue with IV lasix for net negative status  -Continue sildenafil and IV epo  -Reduce dose of prednisone to 40 mg QDAY  -Bx bx per hem/onc today  -Continue to follow Cr.    [name] MD  Pager [phone]

## 2016-07-21 NOTE — Interdisciplinary (Signed)
07/21/16 1026   Follow Up/Progress   Is the Patient Ready for Discharge No   Barriers to Discharge Awaiting clinical improvement   Patient/Family/Legal/Surrogate Decision Maker Has Been Given Options And Choice In The Selection of Post-Acute Care Providers Yes   Plan/Interventions Explore needs and options for aftercare, provide referrals   Remains on Flolan Gtt, diuretic. Thrombocytopenia, unclear ethology, bone marrow Bx pending. Pt ambulates on the hallway with husband. DC home to [facility] and continue home Flolan Gtt.

## 2016-07-21 NOTE — Plan of Care (Signed)
Problem: Coping - Ineffective, Patient and Family  Goal: Effective or improved coping  Outcome: Progressing toward goal, anticipate improvement over: >48 hours  Pt reported that high dose of predonison has been making very emotional. Pt is happy that prednisone will be decreased to 40mg  daily tomorrow.  Continue to monitor.

## 2016-07-22 LAB — BASIC METABOLIC PANEL, BLOOD
Anion Gap: 12 mmol/L (ref 7–15)
BUN: 112 mg/dL — ABNORMAL HIGH (ref 8–23)
Bicarbonate: 27 mmol/L (ref 22–29)
Calcium: 10.3 mg/dL (ref 8.5–10.6)
Chloride: 99 mmol/L (ref 98–107)
Creatinine: 1.29 mg/dL — ABNORMAL HIGH (ref 0.51–0.95)
GFR: 41 mL/min
Glucose: 82 mg/dL (ref 70–99)
Potassium: 3.3 mmol/L — ABNORMAL LOW (ref 3.5–5.1)
Sodium: 138 mmol/L (ref 136–145)

## 2016-07-22 LAB — HEMOGRAM, BLOOD
Hct: 31.7 % — ABNORMAL LOW (ref 34.0–45.0)
Hgb: 10.6 gm/dL — ABNORMAL LOW (ref 11.2–15.7)
MCH: 26.2 pg (ref 26.0–32.0)
MCHC: 33.4 g/dL (ref 32.0–36.0)
MCV: 78.5 um3 — ABNORMAL LOW (ref 79.0–95.0)
NRBC: 1 /100 WBC (ref <1)
Plt Count: 21 10*3/uL — ABNORMAL LOW (ref 140–370)
RBC: 4.04 10*6/uL (ref 3.90–5.20)
RDW: 18.2 % — ABNORMAL HIGH (ref 12.0–14.0)
WBC: 6.2 10*3/uL (ref 4.0–10.0)

## 2016-07-22 LAB — GLUCOSE (POCT)
Glucose (POCT): 89 mg/dL (ref 70–99)
Glucose (POCT): 166 mg/dL — ABNORMAL HIGH (ref 70–99)
Glucose (POCT): 260 mg/dL — ABNORMAL HIGH (ref 70–99)
Glucose (POCT): 216 mg/dL — ABNORMAL HIGH (ref 70–99)

## 2016-07-22 LAB — MAGNESIUM, BLOOD: Magnesium: 2.2 mg/dL (ref 1.6–2.4)

## 2016-07-22 MED ORDER — POTASSIUM CHLORIDE CRYS CR 10 MEQ OR TBCR
40.00 meq | EXTENDED_RELEASE_TABLET | Freq: Once | ORAL | Status: AC
Start: 2016-07-22 — End: 2016-07-22
  Administered 2016-07-22: 40 meq via ORAL
  Filled 2016-07-22: qty 4

## 2016-07-22 MED ORDER — DIGOXIN 0.125 MG OR TABS
125.0000 ug | ORAL_TABLET | Freq: Every evening | ORAL | Status: DC
Start: 2016-07-22 — End: 2016-07-28
  Administered 2016-07-22 – 2016-07-27 (×6): 125 ug via ORAL
  Filled 2016-07-22 (×6): qty 1

## 2016-07-22 NOTE — Plan of Care (Signed)
Problem: Coping - Ineffective, Patient and Family  Goal: Effective or improved coping  Outcome: Progressing toward goal, anticipate improvement over: >48 hours  Pt reported that the predonison makes her very emotional and does not like the feelings. Prednisone was reduced to 40mg  today. Continue to be supportive.

## 2016-07-22 NOTE — Plan of Care (Addendum)
Problem: Tissue Perfusion, Cardiopulmonary - Altered  Goal: Hemodynamic stability  Outcome: Goal Met  Patient A&Ox4. VSS. NSR on tele. Patient denies chest pain/pressure, denies SOB. Resting comfortably in no acute distress. Cont to monitor.     Problem: Fluid Volume Excess  Goal: Absence of edema; peripheral and/or pulmonary  Outcome: Progressing toward goal, anticipate improvement over: Next 24-48 hours  Diuretics administered as ordered. Strict I&O monitored w/ daily weights. Indwelling catheter in place, draining w/ appropriate u/o. Cont to monitor s/s of fluid overload.   Goal: Vital signs within specified parameters  Outcome: Goal Met      Problem: Breathing Pattern - Ineffective  Goal: Arterial blood gas values and/or saturation return to baseline  Outcome: Progressing toward goal, anticipate improvement over: Next 24-48 hours  Respirations even and unlabored. Denies SOB. O2 sats >95% on 3L NC. Lungs clear/dim at the bases upon ausculation. Flolan infusing through dedicated central line. Back up CADD pump at bedside. Central line dressing CDI, patent w/out leaking/kinks in tubing. No alarms noted. Dosage sheet posted on patient's whiteboard. HOB >30 degrees. IS encouraged. No respiratory distress noted- patient instructed to call staff immediately if symptoms of respiratory distress/SOB occurs. Patient and spouse verbalize understanding. Cont to monitor.     Problem: Falls, Risk of  Goal: Keep patient free from falls utilizing universal fall precautions  Outcome: Goal Met  Patient remains free from fall or injury. Patient verbalizes understanding of high fall risk and calls for assistance oob. Patient has been oriented to room. Fall precautions implemented; bed is in low/locked position, call light & tray table within reach, side rails up x2. Patient refuses bed alarm; educated of risk, patient and spouse verbalize understanding. Husband at bedside and assumes role of caregiver, SBA when patient ambulates &  assists w/ ADL's. Wearing nonskid footwear and white board in use. Patient room kept clear of clutter. Cont hourly and prn rounds to assess for patient needs/safety.

## 2016-07-22 NOTE — Plan of Care (Signed)
Problem: Fluid Volume Excess  Goal: Absence of edema; peripheral and/or pulmonary  Outcome: Progressing toward goal, anticipate improvement over: >48 hours  Pt gained 0.48kg over 24 hours. Fluid balance was negative 770ml. Pt denies any shortness of breath. Pt has +1 edema on the bilateral lower leg edema which is stable. Continue to monitor.

## 2016-07-22 NOTE — Interdisciplinary (Signed)
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     Past Medical History:   Diagnosis Date    Pulmonary HTN     Sjoegren syndrome (CMS-HCC) 07/12/2009     No past surgical history on file.     Current diet order:Diet Custom: 2 Gram Sodium  Allergies/intolerances:  Allergies   Allergen Reactions    Allopurinol Hives    Levaquin Swelling and Other     Severe joint pain      Ofloxacin Nausea and Vomiting    Chlorhexidine Itching    Sulfa Drugs Unspecified     Chewing/Swallowing difficulty:none  Nausea/vomiting/diarrhea:none  Reported appetite:fair   Documented PO intake: Pt taking >95% of trays (3-4 items/meal per RN flowsheets).  Pt reports appetite is fair, she is tolerating ordered diet without complaint. Discussed and instructed menu options, alternatives and tips for 2 Gram Sodium diet.  Encouraged balanced diet including fresh local foods and emphasized importance of adequate nutrition.   Explained room Service menu system and reviewed meal ordering through Room Service. Pt has no nutrition concerns/questions at this time.       Tray Items Taken for the past 168 hrs:   Number of Items Taken Number of Items on Tray Diet Tolerance   07/16/16 0800 3 3 Tolerates   07/16/16 1300 3 3 Tolerates   07/17/16 0828 2 2 -   07/17/16 1223 3 3 -   07/17/16 1800 2 2 -   07/18/16 0900 3 3 Tolerates   07/18/16 1233 4 4 Tolerates   07/18/16 1700 4 4 Tolerates   07/18/16 2043 - - Tolerates   07/19/16 0900 3 3 Tolerates   07/19/16 1200 4 4 Tolerates   07/19/16 1800 4 4 Tolerates   07/20/16 0900 3 3 Tolerates   07/20/16 1219 3 3 Tolerates   07/21/16 0829 4 4 Tolerates   07/21/16 1200 3 4 Tolerates   07/21/16 1744 4 4 Tolerates   07/22/16 0900 - - Tolerates      No data found.     Anthropometrics  Height: 5\' 2"  (157.5 cm)Weight: 63.1 kg (139 lb 1.8 oz) Body mass index is 25.44  kg/(m^2).  IBW:110lb  %IBW:126  UBW:145lb  Wt Hx;   Wt Readings from Last 20 Encounters:   07/22/16 63.1 kg (139 lb 1.8 oz)   06/21/16 64 kg (141 lb 1.5 oz)   05/16/16 74.4 kg (164 lb 0.4 oz)   02/26/16 73.9 kg (162 lb 14.4 oz)   12/12/15 73.5 kg (162 lb)   06/20/15 73.5 kg (162 lb)   02/12/15 73.4 kg (161 lb 12.8 oz)   12/21/14 76.3 kg (168 lb 5 oz)   09/27/14 75 kg (165 lb 6.4 oz)   04/12/14 73 kg (161 lb)   11/02/13 73.4 kg (161 lb 14.4 oz)   03/23/13 71.2 kg (157 lb)   02/22/13 70.4 kg (155 lb 3.3 oz)   08/11/12 70.8 kg (156 lb)   02/11/12 69.6 kg (153 lb 6.4 oz)   10/08/11 68.9 kg (151 lb 14.4 oz)   06/24/11 67.6 kg (149 lb 1.6 oz)   06/08/11 69.3 kg (152 lb 11.2 oz)   04/16/11 73.3 kg (161 lb 9.6 oz)   12/18/10 74.4 kg (164 lb)       GI: WDL per head to toe assessment   Edema: +1;Ankle;Foot;Mid Calf;Pitting per  head to toe assessment   Skin:   Patient Lines/Drains/Airways Status    Active PICC Line / CVC Line / PIV Line / Drain / Airway / Intraosseous Line / Epidural Line / ART Line / Line Type / Wound     Name: Placement date: Placement time: Site: Days:    CVC Single Lumen - [place] Left Subclavian       Subclavian       Peripheral IV - 22 G Right Antecubital 07/17/16   2117   Antecubital   4    Indwelling Urinary Catheter -  07/15/16 1852 RN Standard (Latex) 16 fr 10 ml Yes 07/15/16   1852   Standard (Latex)   6                Labs:   Lab Results   Component Value Date    A1C 5.5 07/20/2016     Recent Labs      07/20/16   0528   07/21/16   0539  07/21/16   1740  07/22/16   0615   NA  134*   < >  136  134*  138   K  3.5   < >  3.2*  4.1  3.3*   CL  94*   < >  95*  94*  99   BICARB  28   < >  29  27  27    BUN  123*   < >  114*  110*  112*   CREAT  1.56*   < >  1.46*  1.37*  1.29*   GLU  128*   < >  85  253*  82   [place]  10.7*   < >  10.7*  10.3  10.3   MG  2.4   --   2.3   --   2.2   PHOS  2.6*   --   3.4   --    --     < > = values in this interval not displayed.       BS POCT:   Recent Labs      07/20/16    1817  07/20/16   2035  07/21/16   0736  07/21/16   1149  07/21/16   1737  07/21/16   2104  07/22/16   0614  07/22/16   1233   GLUCPOCT  125*  226*  96  174*  266*  252*  89  166*       Nutr Relevant MEDS:   Current Facility-Administered Medications   Medication    azelastine (ASTELIN) 0.1 % nasal spray 1 spray    cholecalciferol (VITAMIN D) tablet 2,000 Units    dextrose 50 % solution 12.5 g    epoprostenol (FLOLAN) 60,000 ng/mL in glycine diluent 100 mL infusion    ferrous sulfate EC tablet 096 mg    folic acid (FOLVITE) tablet 1 mg    furosemide (LASIX) injection 60 mg    glucagon (GLUCAGON) injection 1 mg    glucose chewable tablet 16 g    glucose oral gel 1 Tube    heparin injection 10,000 Units    insulin lispro (HUMALOG) injection 1-10 Units    medroxyPROGESTERone (PROVERA) tablet 1.25 mg    nalOXone (NARCAN) injection 0.1 mg    predniSONE (DELTASONE) tablet 40 mg    Prostacyclin Cassette Change    Prostacyclin Ice Pack    Prostacyclin Tubing    Prostacylin Batteries  psyllium (METAMUCIL) wafer 2 Wafer    senna (SENOKOT) tablet 17.2 mg    sildenafil (REVATIO) tablet 80 mg **patient own medication**    sodium chloride (PF) 0.9 % flush 3 mL    sodium chloride (PF) 0.9 % flush 3 mL    sodium chloride (PF) 0.9 % flush 3 mL    sodium chloride (PF) 0.9 % flush 3 mL    sodium chloride 0.9 % TKO infusion    sodium chloride 0.9 % TKO infusion    spironolactone (ALDACTONE) tablet 50 mg       Education: if/when appropriate  PLAN: Will continue to monitor PO intake and diet tolerance. Will relay relevant, significant pt info and status change to Registered Dietitian.  Jacqulyn Liner, DTR

## 2016-07-22 NOTE — Plan of Care (Signed)
Problem: Tissue Perfusion, Cardiopulmonary - Altered  Goal: Hemodynamic stability  Outcome: Progressing toward goal, anticipate improvement over: >48 hours  VSS stable. Pt is on Flolan IV. Continue to monitor.

## 2016-07-22 NOTE — Plan of Care (Signed)
Problem: Tissue Perfusion, Cardiopulmonary - Altered  Goal: Hemodynamic stability  Outcome: Goal Met  Patient A&Ox4. VSS. NSR on tele. Patient denies chest pain/pressure, denies SOB. Resting comfortably in no acute distress. Cont to monitor.     Problem: Fluid Volume Excess  Goal: Absence of edema; peripheral and/or pulmonary  Outcome: Progressing toward goal, anticipate improvement over: Next 24-48 hours  Diuretics administered as ordered. Strict I&O monitored w/ daily weights. Indwelling catheter in place, draining w/ appropriate u/o. Cont to monitor s/s of fluid overload.   Goal: Vital signs within specified parameters  Outcome: Goal Met      Problem: Breathing Pattern - Ineffective  Goal: Arterial blood gas values and/or saturation return to baseline  Outcome: Progressing toward goal, anticipate improvement over: Next 24-48 hours  Respirations even and unlabored. Denies SOB. O2 sats >95% on 3L NC. Lungs clear/dim at the bases upon ausculation. Flolan infusing through dedicated central line. Back up CADD pump at bedside. Central line dressing CDI, patent w/out leaking/kinks in tubing. No alarms noted. Dosage sheet posted on patient's whiteboard. HOB >30 degrees. IS encouraged. No respiratory distress noted- patient instructed to call staff immediately if symptoms of respiratory distress/SOB occurs. Patient and spouse verbalize understanding. Cont to monitor.     Problem: Falls, Risk of  Goal: Keep patient free from falls utilizing universal fall precautions  Outcome: Goal Met  Patient remains free from fall or injury. Patient verbalizes understanding of high fall risk and calls for assistance oob. Fall precautions implemented; bed is in low/locked position, call light & tray table within reach, side rails up x2. Patient refuses bed alarm; educated of risk, patient and spouse verbalize understanding. Husband at bedside and assumes role of caregiver, SBA when patient ambulates & assists w/ ADL's. Wearing  nonskid footwear and white board in use. Patient room kept clear of clutter. Cont hourly and prn rounds to assess for patient needs/safety.

## 2016-07-22 NOTE — Plan of Care (Signed)
Problem: Bleeding, Risk of  Goal: Absence of active bleeding  Outcome: Progressing toward goal, anticipate improvement over: Next 24-48 hours  PLT is 21 this AM. Pt has not signs and symptoms of bleeding. Continue to monitor.

## 2016-07-22 NOTE — Progress Notes (Addendum)
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      Subjective:   Diuresed -724cc  Tired this morning but overall still feeling good   Walked 8 laps around the halls yesterday  Eager to get BM biopsy results   No pain at [facility] biopsy site     Objective  Temperature:  [97.7 F (36.5 C)-98.1 F (36.7 C)] 97.7 F (36.5 C) (07/10 1228)  Blood pressure (BP): (103-137)/(55-70) 137/62 (07/10 1228)  Heart Rate:  [85-90] 89 (07/10 1228)  Respirations:  [18-22] 20 (07/10 1228)  Pain Score: 0 (07/10 1228)  O2 Device: Nasal cannula (07/10 1228)  O2 Flow Rate (L/min):  [3 l/min] 3 l/min (07/10 1228)  SpO2:  [96 %-98 %] 96 % (07/10 1228)    Intake/Output Summary (Last 24 hours) at 07/22/16 1247  Last data filed at 07/22/16 1228   Gross per 24 hour   Intake           732.53 ml   Output             2576 ml   Net         -1843.47 ml     Physical Exam  General: NAD, lying in bed, comfortable  Chest: L tunneled IJ   CV: RRR, 3/6 systolic TR murmur  Lungs: mild crackles bilaterally   Abd: soft, nontender, mildly distended  Ext: trace edema bilateral LEs   Skin: flushing on face/trunk, scattered bruising/ecchymosis on upper extremities, improved mild petechiae/flolan rash on LEs    Labs  Lab Results   Component Value Date    WBC 6.2 07/22/2016    RBC 4.04 07/22/2016    HGB 10.6 (L) 07/22/2016    HCT 31.7 (L) 07/22/2016    MCV 78.5 (L) 07/22/2016    MCHC 33.4 07/22/2016    RDW 18.2 (H) 07/22/2016    PLT 21 (L) 07/22/2016    PLT 179 09/12/2008    MPV 12.4 06/12/2016     Lab Results   Component Value Date    NA 138 07/22/2016    K 3.3 (L) 07/22/2016    CL 99 07/22/2016    BICARB 27 07/22/2016    BUN 112 (H) 07/22/2016    CREAT 1.29 (H) 07/22/2016    GLU 82 07/22/2016    [place] 10.3 07/22/2016     No results found for: INR, PTT  Uric acid 11  ESR 32, CRP 1.70   C3 88, C4 18   BNPP 64403 --> 10697.     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     # [place]   # Acute on chronic RV failure - Continuing to improve with diuresis, with improved GI symptoms and SOB. Getting closer to euvolemia. Repeat BNPP from 29163 --> 10697.   - Continue lasix 60 mg IV q6H. Will switch to oral diuretics likely within the next few days   - Continue PAH therapies (epoprostenol 35 ng/kg/min and sildenafil 80 mg TID)  - Continue spironolactone 50 mg daily   - Restart digoxin 125 mcg   - Continue Prednisone 40 mg  daily. Will plan to taper by 10 mg qweek     # Thrombocytopenia - Platelets remain stably low. Etiology remains unclear; was initially most suspicious for drug-induced or ITP however not much change after holding likely offending agents and starting steroids. No bleeding since admission. Heme/onc concerned for bone marrow process.  - Continue to hold Uloric and Dapsone   - Follow up bone marrow biopsy results  - Heme/onc following  - Follow up platelet autoantibody panel  - Ordered drug dependent anti-platelet antibodies to uloric, soluble transferrin receptor   - Continue prednisone    # AKI - Continues to improve. Creatinine nearing baseline, at 1.29 today. Baseline creatinine 1.0-1.2.  - Continue diuresis, as above  - Avoid nephrotoxins     # Elevated digoxin - Likely due to poor clearance with AKI. Could also have been contributing to her nausea initially.    - Restart digoxin today at 125 mcg   - Will closely monitor electrolytes     # Elevated glucose - Due to high-dose steroids.   - SSI     # Hyponatremia - 2/2 hypervolemia. Improving overall with diuresis.   - Continue to diurese, as above     # Hypokalemia - Related to diuresis. Repleting as needed. Reduce to daily BMP     # CTD, SLE - Stable. Continue steroids, as above     # FEN: low salt  # PPX:  contraindicated     Code: DNR/Full care     This patient was seen and discussed with Dr. [name].    Lucy Antigua, MD  Fellow, Pulmonary & Critical Care Medicine    Pulmonary Vascular Attending Addendum:  Patient was examined with Dr. Nelida Meuse, imaging and tests reviewed.  Please see above note for full details and plan.    66 yo F with WHO I PAH associated with prior anorexogen exposure and CTD. Admitted with acute on chronic right heart failure, volume overload, AKI and thrombocytopenia.    -Continue with IV lasix for net negative status.  Anticipate transition to PO diuretics soon.  -Continue sildenafil and IV epo  -Continue prednisone to 40 mg QDAY for 1 week then taper to 30 mg x 1 week the taper to 20 mg.  -Bx bx 07/21/16 for thrombocytopenia - results pending.  Given time course/clinical presentation suspect related to new medication uloric.  -Continue to follow Cr -Improving with diuresis    [name] MD  Pager [phone]

## 2016-07-23 ENCOUNTER — Ambulatory Visit (HOSPITAL_COMMUNITY): Payer: Medicare Other | Admitting: Pulmonary Medicine

## 2016-07-23 ENCOUNTER — Encounter (HOSPITAL_COMMUNITY): Payer: Self-pay

## 2016-07-23 LAB — BASIC METABOLIC PANEL, BLOOD
Anion Gap: 11 mmol/L (ref 7–15)
BUN: 106 mg/dL — ABNORMAL HIGH (ref 8–23)
Bicarbonate: 27 mmol/L (ref 22–29)
Calcium: 9.9 mg/dL (ref 8.5–10.6)
Chloride: 96 mmol/L — ABNORMAL LOW (ref 98–107)
Creatinine: 1.29 mg/dL — ABNORMAL HIGH (ref 0.51–0.95)
GFR: 41 mL/min
Glucose: 94 mg/dL (ref 70–99)
Potassium: 3.1 mmol/L — ABNORMAL LOW (ref 3.5–5.1)
Sodium: 134 mmol/L — ABNORMAL LOW (ref 136–145)

## 2016-07-23 LAB — HEMOGRAM, BLOOD
Hct: 31.8 % — ABNORMAL LOW (ref 34.0–45.0)
Hgb: 10.1 gm/dL — ABNORMAL LOW (ref 11.2–15.7)
MCH: 25.3 pg — ABNORMAL LOW (ref 26.0–32.0)
MCHC: 31.8 g/dL — ABNORMAL LOW (ref 32.0–36.0)
MCV: 79.7 um3 (ref 79.0–95.0)
Plt Count: 19 10*3/uL — CL (ref 140–370)
RBC: 3.99 10*6/uL (ref 3.90–5.20)
RDW: 18.2 % — ABNORMAL HIGH (ref 12.0–14.0)
WBC: 5.5 10*3/uL (ref 4.0–10.0)

## 2016-07-23 LAB — GLUCOSE (POCT)
Glucose (POCT): 88 mg/dL (ref 70–99)
Glucose (POCT): 176 mg/dL — ABNORMAL HIGH (ref 70–99)
Glucose (POCT): 223 mg/dL — ABNORMAL HIGH (ref 70–99)
Glucose (POCT): 226 mg/dL — ABNORMAL HIGH (ref 70–99)

## 2016-07-23 LAB — MAGNESIUM, BLOOD: Magnesium: 2.2 mg/dL (ref 1.6–2.4)

## 2016-07-23 MED ORDER — POTASSIUM CHLORIDE CRYS CR 10 MEQ OR TBCR
40.00 meq | EXTENDED_RELEASE_TABLET | ORAL | Status: AC
Start: 2016-07-23 — End: 2016-07-23
  Administered 2016-07-23 (×2): 40 meq via ORAL
  Filled 2016-07-23 (×2): qty 4

## 2016-07-23 MED ORDER — POTASSIUM CHLORIDE CRYS CR 10 MEQ OR TBCR
30.0000 meq | EXTENDED_RELEASE_TABLET | ORAL | Status: DC
Start: 2016-07-23 — End: 2016-07-23

## 2016-07-23 NOTE — Plan of Care (Signed)
Problem: Bleeding, Risk of  Goal: Absence of active bleeding  Outcome: Unable to meet goal at this time.  Pt with plt count 19,000 this morning, paged Dr Duanne Moron to notify. Pt had minor nosebleed early this afternoon, notified Dr Ronny Flurry by text page and Dr [name] in person when he was rounding. No other s/sx bleeding. H/H stable at 10.1/31.8.    Problem: Tissue Perfusion, Cardiopulmonary - Altered  Goal: Hemodynamic stability  Outcome: Unable to meet goal at this time.  NSR on monitor, VSS. BLE 1+ edema noted, pt states her husband thinks her abdominal swelling is slightly worse today, receiving IV lasix per orders. K=3.1 this morning, replacement given.     Problem: Fluid Volume Excess  Goal: Absence of edema; peripheral and/or pulmonary  Outcome: Unable to meet goal at this time.  BLE edema noted, weight slightly up today, MD aware.     Problem: Coping - Ineffective, Patient and Family  Goal: Effective or improved coping  Outcome: Goal Met

## 2016-07-23 NOTE — Interdisciplinary (Signed)
*  Acknowledge Nutrition Consult RE: pt requesting nutritionist eval. severe PH, thrombocytopenia  Met with Pt and husband who were hoping to speak with RD regarding foods to improve thrombocytopenia. Discussed avoiding alcohol and making sure Pt is getting a well-balanced diet to prevent nutrient deficiencies. Endorse balanced diet and Pt takes folic acid and O75 supplement.  Pt has been following low sodium diet since April and has a helpful cookbook they have been using. Provided handout on low sodium nutrition therapy from the Nutrition Care Manual and sodium-free flavoring tips. Pt and husband also mentioned increased BG d/t Pt currently on steroids. Discussed foods with carbohydrates and system for carb counting. Are hoping steroids are short-term. If plan for Pt to d/c on steroids and having high BG, encouraged Pt/husband to follow up with RD for additional information on managing BG with diet at home.   Will continue to follow up per policy.  Isaac Laud, RD  Pager [phone]

## 2016-07-23 NOTE — Progress Notes (Addendum)
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      Subjective:   Diuresed -1.9L  Walked 8 laps around the halls yesterday again  Anxious to go home     Objective  Temperature:  [97.7 F (36.5 C)-98.6 F (37 C)] 98.1 F (36.7 C) (07/11 1147)  Blood pressure (BP): (116-137)/(62-81) 131/74 (07/11 1147)  Heart Rate:  [84-93] 87 (07/11 1147)  Respirations:  [18-20] 20 (07/11 1147)  Pain Score: 0 (07/11 1147)  O2 Device: Nasal cannula (07/11 1147)  O2 Flow Rate (L/min):  [3 l/min] 3 l/min (07/11 1147)  SpO2:  [93 %-100 %] 98 % (07/11 1147)    Intake/Output Summary (Last 24 hours) at 07/23/16 1220  Last data filed at 07/23/16 0800   Gross per 24 hour   Intake           988.01 ml   Output             2076 ml   Net         -1087.99 ml     Physical Exam  General: NAD, sitting in chair, comfortable  Chest: L tunneled IJ   CV: RRR, 3/6 systolic TR murmur  Lungs: occasional crackles in LL, otherwise clear   Abd: soft, nontender, mildly distended  Ext: trace edema bilateral LEs   Skin: flushing on face/trunk, scattered bruising/ecchymosis on upper extremities, improved mild petechiae/flolan rash on LEs    Labs  Lab Results   Component Value Date    WBC 5.5 07/23/2016    RBC 3.99 07/23/2016    HGB 10.1 (L) 07/23/2016    HCT 31.8 (L) 07/23/2016    MCV 79.7 07/23/2016    MCHC 31.8 (L) 07/23/2016    RDW 18.2 (H) 07/23/2016    PLT 19 (LL) 07/23/2016    PLT 179 09/12/2008    MPV 12.4 06/12/2016     Lab Results   Component Value Date    NA 134 (L) 07/23/2016    K 3.1 (L) 07/23/2016    CL 96 (L) 07/23/2016    BICARB 27 07/23/2016    BUN 106 (H) 07/23/2016    CREAT 1.29 (H) 07/23/2016    GLU 94 07/23/2016     9.9 07/23/2016     No results found for: INR, PTT  Uric acid 11  ESR 32, CRP 1.70   C3 88, C4 18   BNPP 30076 --> 10697.     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     # [place]   # Acute on chronic RV failure - Continuing to improve with diuresis, with improved GI symptoms and SOB. Getting closer to euvolemia.    - Continue lasix 60 mg IV q6H. Will plan to switch to oral diuretics if still doing well   - Continue PAH therapies (epoprostenol 35 ng/kg/min and sildenafil 80 mg TID)  - Continue spironolactone 50 mg daily   - Restarted digoxin 125 mcg. Check dig level tomorrow   - Continue Prednisone 40 mg daily (7/10-). Will taper by 10 mg qweek     # Thrombocytopenia - Platelets remain  stably low. Etiology remains unclear. Now s/p bone marrow biopsy. No bleeding since admission.  - Continue to hold Uloric and Dapsone   - Follow up bone marrow biopsy results. Anticipate preliminary result this afternoon   - Heme/onc following  - Follow up platelet autoantibody panel, drug dependent anti-platelet antibodies to uloric, soluble transferrin receptor, Epo level   - Continue prednisone    # AKI - Continues to improve. Creatinine nearing baseline, at 1.29 today. Baseline creatinine 1.0-1.2.  - Continue diuresis, as above  - Avoid nephrotoxins     # Elevated glucose - Due to high-dose steroids.   - SSI     # Hyponatremia - 2/2 hypervolemia. Improved overall with diuresis.   - Continue to diurese, as above     # Hypokalemia - Related to diuresis. Repleting as needed. Daily BMP     # CTD, SLE - Stable. Continue steroids, as above     # FEN: low salt  # PPX: contraindicated     Code: DNR/Full care     This patient was seen and discussed with Dr. [name].    Lucy Antigua, MD  Fellow, Pulmonary & Critical Care Medicine      Pulmonary Vascular Attending Addendum:  Patient was examined with Dr. Nelida Meuse, imaging and tests reviewed.  Please see above note for full details and plan.    66 yo F with WHO I PAH associated with prior anorexogen  exposure and CTD. Admitted with acute on chronic right heart failure, volume overload, AKI and thrombocytopenia.    -Continue with IV lasix for net negative status.  Anticipate transition to PO diuretics soon.  -Continue sildenafil and IV epo  -Continue prednisone to 40 mg QDAY (day 2) for 1 week then taper to 30 mg x 1 week the taper to 20 mg.  -Bx bx 07/21/16 for thrombocytopenia - results pending.  Given time course/clinical presentation suspect related to new medication uloric.  -Continue to follow Cr -Improving with diuresis  -Resume digoxin at lower dose 125 mcg QDAY    [name] MD  Pager [phone]

## 2016-07-24 DIAGNOSIS — R898 Other abnormal findings in specimens from other organs, systems and tissues: Secondary | ICD-10-CM

## 2016-07-24 LAB — ERYTHROPOIETIN, BLOOD: Erythropoietin: 19 mU/mL (ref 4–27)

## 2016-07-24 LAB — BASIC METABOLIC PANEL, BLOOD
Anion Gap: 11 mmol/L (ref 7–15)
BUN: 94 mg/dL — ABNORMAL HIGH (ref 8–23)
Bicarbonate: 27 mmol/L (ref 22–29)
Calcium: 10 mg/dL (ref 8.5–10.6)
Chloride: 100 mmol/L (ref 98–107)
Creatinine: 1.18 mg/dL — ABNORMAL HIGH (ref 0.51–0.95)
GFR: 46 mL/min
Glucose: 85 mg/dL (ref 70–99)
Potassium: 3.5 mmol/L (ref 3.5–5.1)
Sodium: 138 mmol/L (ref 136–145)

## 2016-07-24 LAB — FISH: MDS ANALYSIS, OTHER

## 2016-07-24 LAB — CHROMOSOME ANALYSIS,BONE MARROW

## 2016-07-24 LAB — FISH: LYMPHOMA B CELL ANALYSIS, OTHER

## 2016-07-24 LAB — HEMOGRAM, BLOOD
Hct: 31.7 % — ABNORMAL LOW (ref 34.0–45.0)
Hgb: 10.5 gm/dL — ABNORMAL LOW (ref 11.2–15.7)
MCH: 26.4 pg (ref 26.0–32.0)
MCHC: 33.1 g/dL (ref 32.0–36.0)
MCV: 79.6 um3 (ref 79.0–95.0)
NRBC: 1 /100 WBC (ref <1)
Plt Count: 19 10*3/uL — CL (ref 140–370)
RBC: 3.98 10*6/uL (ref 3.90–5.20)
RDW: 18.6 % — ABNORMAL HIGH (ref 12.0–14.0)
WBC: 6 10*3/uL (ref 4.0–10.0)

## 2016-07-24 LAB — GLUCOSE (POCT)
Glucose (POCT): 95 mg/dL (ref 70–99)
Glucose (POCT): 139 mg/dL — ABNORMAL HIGH (ref 70–99)
Glucose (POCT): 207 mg/dL — ABNORMAL HIGH (ref 70–99)
Glucose (POCT): 189 mg/dL — ABNORMAL HIGH (ref 70–99)

## 2016-07-24 LAB — MAGNESIUM, BLOOD: Magnesium: 2.2 mg/dL (ref 1.6–2.4)

## 2016-07-24 LAB — DIGOXIN, BLOOD: Digoxin: 1.1 ng/mL (ref 0.6–1.2)

## 2016-07-24 MED ORDER — POTASSIUM CHLORIDE CRYS CR 10 MEQ OR TBCR
40.00 meq | EXTENDED_RELEASE_TABLET | Freq: Once | ORAL | Status: AC
Start: 2016-07-24 — End: 2016-07-24
  Administered 2016-07-24: 40 meq via ORAL
  Filled 2016-07-24: qty 4

## 2016-07-24 MED ORDER — FUROSEMIDE 40 MG OR TABS
100.0000 mg | ORAL_TABLET | Freq: Three times a day (TID) | ORAL | Status: DC
Start: 2016-07-24 — End: 2016-07-28
  Administered 2016-07-24 – 2016-07-28 (×11): 100 mg via ORAL
  Filled 2016-07-24 (×11): qty 1

## 2016-07-24 NOTE — Procedures (Addendum)
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  -Circulating immature granulocytes (4%)  -Thrombocytopenia.    INTERPRETATION:  The peripheral blood has circulating immature granulocytes, nucleated RBCs,  and increased teardrop cells consistent with leukoerythroblastic changes.  The marrow is hypercellular with trilineage hematopoiesis and  with  megakaryocyte hyperplasia, atypia and clustering. Focally, mild reticulin  fibrosis is present (mostly WHO grade0 but focally WHO grade 1).  Flow  cytometry reveals NO evidence for increased or aberrant myeloblasts.  The  overall morphologic features are suspicious for a myeloproliferative  neoplasm (MPN), and MPN has been associated with pulmonary hypertension*.  Please correlate clinically and with pending molecular studies (NGS heme  malignancies panel on blood). Preliminary results were communicated to Dr  Delsa Grana (hematology) at 15:10 on 07/23/2016 (HEB).  * Pulmonary hypertension in patients with chronic myeloproliferative  disorders  Charlesetta Ivory, Teodoro Kil  European Respiratory Review 2015 24: 102-410; DOI:  [email or web address]  BRIEF CLINICAL HISTORY:  66 yo female hx PAH with thrombocytopenia of unclear etiology.    HEMOGRAM:  WBC 6.4 K/Cu.mm  RBC 4.34 M/Cu.mm  HGB 10.9 g/dL  HCT 34.0%  MCV 78.3 fL  RDW 18.0%  PLT 20 K/Cu.mm.  Differential count:  segmented neutrophils 71%  bands 4%  lymphocytes 17%  monocytes 4%  eosinophils 0%  myelocytes 4%  nucleated RBC 1/100 WBC  Absolute count:  neutrophils 4800/Cu.mm  immature granulocytes 300/Cu.mm  lymphocytes 1100/Cu.mm  monocytes  300/Cu.mm  PERIPHERAL SMEAR:  Red blood cells: Normocytic, normochromic anemia with moderate  anisopoikilocytosis and increased teardrop cells. Rare nucleated RBCs are  present.  White blood cells: Within normal range with a normal differential. 4%  immature granulocytes are present, but .there are no circulating blasts.  Platelets: Markedly decreased with adequate morphology  ASPIRATE SMEAR:  Quality: Inadequate due to lack of spicules  Spicules: Absent    TOUCH PREP:  Quality: Adequate  Spicules: Present  Myeloid: Normal morphology and maturation  Erythroid: Normal morphology and maturation  Myeloid/Erythroid ratio: Within normal limits (1.7:1)  Megakaryocytes: Rare megakaryocytes present with normal morphology  Lymphocytes: Normal  Plasma cells: Normal in number with adequate morphology  A 200-cell differential count yields:  3% blasts  1% promyelocytes  20% myelocytes  17% metamyelocytes  21% bands and segmented neutrophils  0% eosinophils  0% plasma cells  2% lymphocytes  1% monocytes  35% erythroid precursors  CORE BIOPSY:  Size: 1.6 cm  Quality: Adequate  Cellularity: 70%, hypercellular for age  Myeloid: Increased  Erythroid: Increased  Megakaryocytes: Increased in number with uneven distribution and  clustering. Megakaryocytes have atypical nuclear features that are "cloud  like".  Lymphoid: Normal  Plasma cells: Normal in number  Other: No metastatic disease, granulomas, or viral inclusions identified    CLOT SECTION:  Not available  IRON STAIN:  The iron stain on the core biopsy demonstrates iron stores are present.  Ringed sideroblasts cannot be evaluated due to lack of an aspirate smear to  stain.    SPECIAL STAINS:  Stains on the core reveal: There are no increased blasts highlighted by  CD34 and CD117 stain, with CD34+ blasts comprising less than 1% of total  marrow cellularity. CD61 confirms increased presence of megakaryocytes with  clustering. CD138 highlights individual scattered plasma cells  comprising  3-5% of total cellularity. Focally mild reticulin fibrosis is present  (mostly WHO grade 0 with focal WHO grade I). Trichrome is negative for  fibrosis.  FLOW CYTOMETRY SSESSMENT (see separate report for details):  The sample for the current flow cytometry analysis was received 07/21/2016  and stained on 07/22/2016.  Due to limited sample, viability was not determined, and an abbreviated  panel was tested.  Flow cytometric analysis was performed on a minimum of 16109 cells for each  antibody.  The lysed bone marrow core was stained with antibodies directed against the  following antigens:CD3, CD4, CD5, CD8, CD10, CD19, CD38, CD45, sKappa,  sLambda.  FLOW CYTOMETRY RESULTS:   Of the total cells:  75% are leukocytes  (CD45+).  2.5% are B-lymphocytes (CD19+, CD20+) with kappa to lambda immunoglobulin  light chain ratio of 2:1 (surface).  16% are T-lymphocytes (CD3+, CD5+) with a CD4:CD8 ratio of 2:1.  CYTOGENETICS:  Cytogenetics analysis is pending and the results will be reported in an  addendum.  SPECIAL STAINS/PROCEDURES:  Special stains and/or procedures were used in  the final interpretation.  All stains were performed with appropriate  positive and negative controls.  The immunostain(s) reported above were developed and their performance  characteristics determined by the [facility] Department of  Pathology.  They have not been cleared or approved by the U.S. Food and  Drug Administration, although such approval is not required for  analyte-specific reagents of this type.  The FDA has determined that such  clearance is not necessary.  This laboratory is regulated under the  Clinical Laboratory Improvement Amendments of 1988 (CLIA) as qualified to  perform high complexity clinical testing.  Pursuant to the requirements of  CLIA, this laboratory has established and verified the test's relevant  performance specifications.  Data collected for the verification process  are available upon  request.  CONFIDENTIAL HEALTH INFORMATION: Health Care information is personal and  sensitive information. If it is being faxed to you it is done so under  appropriate authorization from the patient or under circumstances that do  not require patient authorization. You, the recipient, are obligated to  maintain it in a safe, secure and confidential manner. Re-disclosure  without additional patient consent or as permitted by law is prohibited.  Unauthorized re-disclosure or failure to maintain confidentiality could  subject you to penalties described in federal and state law.  If you have  received this report or facsimile in error, please notify the [facility]  [facility] Department immediately and destroy the received document(s).    Material reviewed and Interpreted and  Report Electronically Signed by:  Lura Em M.D. [phone])  Attending Pathologist  07/24/16 19:04  Electronic Signature derived from a single  controlled access password  ADDENDUM:  CYTOGENETICS REPORT(see separate report for details)  46,[id number]  ish(EGR1,D5S23)x2[200],(MYBx2)[200],(D7S486,D7Z1)x2[200],(D8Z2x2)[200],(MYC    x2)[200],  (CCND1,IGH)x2[200],(D13S319,13qtel)x2[200],(IGH,BCL2)x2[200],(D20S108x2)[20    0].  Normal female karyotype.  FISH results within normal range.  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  Given the history  of pulmonary hypertension and morphologic findings in the marrow  suspicious  for myeloproliferative neoplasm, repeat testing of blood or marrow for  mutations associated with myeloproliferative neoplasms (JAK2, MPL, and  CALR) is suggested.    Material reviewed and Interpreted and  Report Electronically Signed by:  Lura Em M.D. [phone])  Attending Pathologist  08/15/16 11:42  Electronic Signature derived from a single  controlled access password

## 2016-07-24 NOTE — Plan of Care (Signed)
Problem: Bleeding, Risk of  Goal: Absence of active bleeding  Outcome: Goal Met  Pt with platelet level of 19,000 and admitted with baseline low platelet levels, cause still unknown.  Pt being followed by hematology team.  Monitoring Daily CBC.  No further nose bleeds this shift or other s/o bleeding.  Pt with scattered bruising related to flolan therapy and on prednisone taper.  Will continue to monitor.    Problem: Tissue Perfusion, Cardiopulmonary - Altered  Goal: Hemodynamic stability  Outcome: Goal Met  Vitals stable and monitoring q4hrs.  Pt in NSR with frequent PVCs on continuous telemetry monitoring.  Palpable pulses with brisk cap refill.  Pt with +1 edema to bilateral feet/ankles and abdomen.  Pt denies any chest pain/palpitations or SOB and instructed to report if she experiences any of these symptoms.  Flolan infusing through dedicated line at 72 ml/hr, pt tolerating well and back-up pump in room.    Problem: Fluid Volume Excess  Goal: Absence of edema; peripheral and/or pulmonary  Outcome: Unable to meet goal at this time.  Pt with +1 edema to bilateral ankles/feet and abdomen.  Pt getting IV lasix q 6 hrs and foley in place.  Pt diuresing adequate amounts.  Measuring strict I/O's and checking daily weights.  No fluid restriction at this time.

## 2016-07-24 NOTE — Interdisciplinary (Signed)
07/24/16 1114   Follow Up/Progress   Is the Patient Ready for Discharge No   Barriers to Discharge Awaiting clinical improvement   Patient/Family/Legal/Surrogate Decision Maker Has Been Given Options And Choice In The Selection of Post-Acute Care Providers Yes   Plan/Interventions Explore needs and options for aftercare, provide referrals   Continue diuresis with IV lasix,anticipate transition to PO diuretics soon, tapering off prednisone. Anticipating dc home on Home O2 and Flollan Gtt. Pt has all the equipment, able to manage her meds. Husband at the bs.

## 2016-07-24 NOTE — Plan of Care (Signed)
Problem: Bleeding, Risk of  Goal: Absence of active bleeding  Outcome: Progressing toward goal, anticipate improvement over: Next 24-48 hours  Outcome: No bleeding s/s today   Pt Diana Chambers, 66 year old, female with no signs of active bleeding. Pt is on Coumadin for prevention of blood clots. Diffuse multiple bruising, petechiae, or hematoma noted. Pt verbalized understanding for S/S of bleeding and to notify RN with any changes. Will continue to monitor for bleeding.    No results found for: INR, PTT    Lab Results   Component Value Date    WBC 6.0 07/24/2016    RBC 3.98 07/24/2016    HGB 10.5 (L) 07/24/2016    HCT 31.7 (L) 07/24/2016    MCV 79.6 07/24/2016    MCHC 33.1 07/24/2016    RDW 18.6 (H) 07/24/2016    PLT 19 (LL) 07/24/2016    PLT 179 09/12/2008    MPV 12.4 06/12/2016       Problem: Tissue Perfusion, Cardiopulmonary - Altered  Goal: Hemodynamic stability  Outcome: Progressing toward goal, anticipate improvement over: Next 24-48 hours  Outcome: Pt had no cardiac related distress today  Pt Diana Chambers, 66 year old, female, with skin is normal for ethnicity, warm, and dry. Pt denies CP, palpitations, SOB or dyspnea. Adequate peripheral pulses in all extremities, full sensation, BLE edema. HR 80-90 and NSR rhythm, BPs please see documentation flowsheet, rapid capillary refill. Pt denies dizziness at rest or with ambulation. Verbalizes understanding to notify RN for SOB/CP immediately or numbness/ tingling sensations. S1/S2 heard.     Flolan checked at shift change. Flolan settings verified via power module, CADD pump stabilized and in safety pouch with ice cooling, batteries checked, x1 extra CADD at beside, Flolan orders verified, paper taped to door. Will continue to monitor.    Will continue to monitor.      Temperature:  [97.9 F (36.6 C)-98.5 F (36.9 C)] 98 F (36.7 C) (07/12 1233)  Blood pressure (BP): (107-149)/(57-71) 149/70 (07/12 1233)  Heart Rate:  [84-93] 84 (07/12  0526)  Respirations:  [16-20] 18 (07/12 1233)  Pain Score: 0 (07/12 1233)  O2 Device: Nasal cannula (07/12 1233)  O2 Flow Rate (L/min):  [3 l/min] 3 l/min (07/12 1233)  SpO2:  [96 %-98 %] 98 % (07/12 1233)    Date Weight Recorded 07/24/2016 07/23/2016 07/22/2016 07/21/2016 07/20/2016 07/19/2016   Metric 63.9 kg 63.7 kg 63.1 kg 62.8 kg 62.8 kg 62.2 kg   Pounds/Ounces 140 lb 14 oz 140 lb 6.9 oz 139 lb 1.8 oz 138 lb 7.2 oz 138 lb 7.2 oz 137 lb 2 oz        Problem: Fluid Volume Excess  Goal: Absence of edema; peripheral and/or pulmonary  Outcome: Progressing toward goal, anticipate improvement over: Next 24-48 hours  Outcome: Pt has no s/s of FVO or dehydration  Pt Diana Chambers, 66 year old, female with no edema overall, lungs sound clear but diminished at times. Encourage adequate fluid hydration and educated regarding s/s of FVO and to call if any respiratory distress and/or swelling occurs. Pt on Lasix from IV to PO for diuretics. Will continue to monitor.    Lab Results   Component Value Date    NA 138 07/24/2016    K 3.5 07/24/2016    CL 100 07/24/2016    BICARB 27 07/24/2016    BUN 94 (H) 07/24/2016    CREAT 1.18 (H) 07/24/2016    GLU 85 07/24/2016  [place] 10.0 07/24/2016     Lab Results   Component Value Date    BNP 53 10/09/2008    BNPP 10697 (H) 07/21/2016       Yesterday's intake and output:  07/11 0600 - 07/12 0559  In: 1332.7 [P.O.:1220; I.V.:112.7]  Out: 2726 [Urine:2725]    Weights (last 14 days)     Date/Time Weight Weight Source Percentage Weight Change (%) Who    07/24/16 0526 63.9 kg (140 lb 14 oz) Standing scale 0.31 % SB    07/23/16 0609 63.7 kg (140 lb 6.9 oz) Standing scale 0.95 % AG    07/22/16 0400 63.1 kg (139 lb 1.8 oz) Standing scale 0.48 % RS    07/21/16 0546 62.8 kg (138 lb 7.2 oz) Standing scale 0 % LC    07/20/16 0525 62.8 kg (138 lb 7.2 oz) Standing scale 0.96 % LC    07/19/16 0451 62.2 kg (137 lb 2 oz) Standing scale -0.32 % TC    07/18/16 0527 62.4 kg (137 lb 9.1 oz) Standing scale -0.95 %  MM    07/17/16 0327 63 kg (138 lb 14.2 oz) Standing scale 0.64 % RS    07/16/16 0248 62.6 kg (138 lb 0.1 oz) Standing scale -3.49 % KM    07/14/16 2242 64.9 kg (143 lb) Standing scale 0 % BS              Problem: Coping - Ineffective, Patient and Family  Goal: Effective or improved coping  Outcome: Progressing toward goal, anticipate improvement over: Next 24-48 hours  Outcome: Pt has adequate support system of Husband [name]  Pt [name], 66 year old, female provided emotional and promoted family support due to hospitalization and changing situation beyond their control. Provided for distraction methods such as television, reading, therapeutic music, humor, and encouraged friends and family visitation. Will continue to monitor.

## 2016-07-24 NOTE — Progress Notes (Addendum)
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      Subjective:   Diuresed -1.3L  Walked 12 laps yesterday around the hall   Prelim bone marrow biopsy shows myelofibrosis   Doing a lot of reading on that     Objective  Temperature:  [97.9 F (36.6 C)-98.5 F (36.9 C)] 98 F (36.7 C) (07/12 1233)  Blood pressure (BP): (107-149)/(57-71) 149/70 (07/12 1233)  Heart Rate:  [84-93] 84 (07/12 0526)  Respirations:  [16-20] 18 (07/12 1233)  Pain Score: 0 (07/12 1233)  O2 Device: Nasal cannula (07/12 1233)  O2 Flow Rate (L/min):  [3 l/min] 3 l/min (07/12 1233)  SpO2:  [96 %-98 %] 98 % (07/12 1233)    Intake/Output Summary (Last 24 hours) at 07/24/16 1712  Last data filed at 07/24/16 1233   Gross per 24 hour   Intake          1671.28 ml   Output             2150 ml   Net          -478.72 ml   '  Physical Exam  General: NAD, sitting in chair, comfortable, eating breakfast   Chest: L tunneled IJ   CV: RRR, 3/6 systolic TR murmur  Lungs: CTAB  Abd: soft, nontender, mildly distended  Ext: trace edema bilateral LEs   Skin: flushing on face/trunk, scattered bruising/ecchymosis on upper extremities, improved mild petechiae/flolan rash on LEs    Labs  Lab Results   Component Value Date    WBC 6.0 07/24/2016    RBC 3.98 07/24/2016    HGB 10.5 (L) 07/24/2016    HCT 31.7 (L) 07/24/2016    MCV 79.6 07/24/2016    MCHC 33.1 07/24/2016    RDW 18.6 (H) 07/24/2016    PLT 19 (LL) 07/24/2016    PLT 179 09/12/2008    MPV 12.4 06/12/2016     Lab Results   Component Value Date    NA 138 07/24/2016    K 3.5 07/24/2016    CL 100 07/24/2016    BICARB 27 07/24/2016    BUN 94 (H) 07/24/2016    CREAT 1.18 (H) 07/24/2016    GLU 85 07/24/2016    [place] 10.0 07/24/2016     No results found for: INR, PTT  Uric acid 11  ESR 32, CRP 1.70   C3 88, C4 18   BNPP 16109 --> 10697.     Imaging  None new     ASSESSMENT AND PLAN  Diana Chambers is a 66 year old year old female with 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     # [place]   # Acute on chronic RV failure - Continuing to improve with diuresis.   - D/c IV lasix. Start lasix 100 mg po TID   - Continue PAH therapies (epoprostenol 35 ng/kg/min and sildenafil 80 mg TID)  - Continue spironolactone 50 mg daily   - Continue digoxin 125 mcg.  - Continue Prednisone 40 mg daily (7/10-). Will taper by 10 mg qweek (start 30 mg on 7/17)    # Thrombocytopenia - Platelets remain stably low. 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 Performing further stains now.   -  Continue to hold Uloric and Dapsone   - Heme/onc following  - Awaiting further confirmatory stains for myelofibrosis  - Follow up platelet autoantibody panel, drug dependent anti-platelet antibodies to uloric, soluble transferrin receptor, NGS hematologic malignancy panel  - Continue prednisone    # AKI - Resolved. Baseline creatinine 1.0-1.2.  - Continue diuresis, as above  - Avoid nephrotoxins     # Elevated glucose - Due to high-dose steroids.   - SSI     # Hyponatremia - 2/2 hypervolemia. Resolved.    - Continue to diurese, as above     # CTD, SLE - Stable. Continue steroids, as above . Resolved.    - Continue to diurese, as above     # FEN: low salt  # PPX: contraindicated     Code: DNR/Full care     This patient was seen and discussed with Dr. Bettey Chambers.    Diana Antigua, MD  Fellow, Pulmonary & Critical Care Medicine    Pulmonary Vascular Attending Addendum:  Patient was examined with Dr. Nelida Chambers, imaging and tests reviewed.  Please see above note for full details and plan.    66 yo F with WHO I PAH associated with prior anorexogen exposure and CTD. Admitted with acute on chronic right heart failure, volume overload, AKI and thrombocytopenia.    -Transition from IV lasix to PO.  -Continue sildenafil and IV  epo  -Continueprednisone to 40 mg QDAY (day 3) for 1 week then taper to 30 mg x 1 week the taper to 20 mg.  -Bx bx 07/21/16 for thrombocytopenia - results pending. Given time course/clinical presentation suspect related to new medication uloric.  -Continue to follow Cr -Improving with diuresis  -Continue digoxin at lower dose 125 mcg QDAY  -Appreciate heme involvement. - pre-lim BM bx consistent with myelofibrosis?    Diana Mohs MD  Pager [phone]

## 2016-07-25 LAB — BASIC METABOLIC PANEL, BLOOD
Anion Gap: 11 mmol/L (ref 7–15)
BUN: 86 mg/dL — ABNORMAL HIGH (ref 8–23)
Bicarbonate: 26 mmol/L (ref 22–29)
Calcium: 9.9 mg/dL (ref 8.5–10.6)
Chloride: 100 mmol/L (ref 98–107)
Creatinine: 1.25 mg/dL — ABNORMAL HIGH (ref 0.51–0.95)
GFR: 43 mL/min
Glucose: 77 mg/dL (ref 70–99)
Potassium: 3.6 mmol/L (ref 3.5–5.1)
Sodium: 137 mmol/L (ref 136–145)

## 2016-07-25 LAB — HEMOGRAM, BLOOD
Hct: 31.4 % — ABNORMAL LOW (ref 34.0–45.0)
Hgb: 10.4 gm/dL — ABNORMAL LOW (ref 11.2–15.7)
MCH: 26.6 pg (ref 26.0–32.0)
MCHC: 33.1 g/dL (ref 32.0–36.0)
MCV: 80.3 um3 (ref 79.0–95.0)
Plt Count: 20 10*3/uL — ABNORMAL LOW (ref 140–370)
RBC: 3.91 10*6/uL (ref 3.90–5.20)
RDW: 18.7 % — ABNORMAL HIGH (ref 12.0–14.0)
WBC: 5.9 10*3/uL (ref 4.0–10.0)

## 2016-07-25 LAB — GLUCOSE (POCT)
Glucose (POCT): 106 mg/dL — ABNORMAL HIGH (ref 70–99)
Glucose (POCT): 111 mg/dL — ABNORMAL HIGH (ref 70–99)
Glucose (POCT): 191 mg/dL — ABNORMAL HIGH (ref 70–99)
Glucose (POCT): 202 mg/dL — ABNORMAL HIGH (ref 70–99)

## 2016-07-25 LAB — MAGNESIUM, BLOOD: Magnesium: 2.2 mg/dL (ref 1.6–2.4)

## 2016-07-25 LAB — LAB MISC TEST

## 2016-07-25 MED ORDER — POTASSIUM CHLORIDE CRYS CR 10 MEQ OR TBCR
40.0000 meq | EXTENDED_RELEASE_TABLET | Freq: Once | ORAL | Status: DC
Start: 2016-07-25 — End: 2016-07-25
  Filled 2016-07-25: qty 4

## 2016-07-25 MED ORDER — POTASSIUM CHLORIDE CRYS CR 10 MEQ OR TBCR
40.00 meq | EXTENDED_RELEASE_TABLET | Freq: Once | ORAL | Status: AC
Start: 2016-07-25 — End: 2016-07-25
  Administered 2016-07-25: 40 meq via ORAL
  Filled 2016-07-25: qty 4

## 2016-07-25 NOTE — Plan of Care (Signed)
Problem: Bleeding, Risk of  Goal: Absence of active bleeding  Outcome: Progressing toward goal, anticipate improvement over: next 12-24 hours  No signs of active bleeding. Patient on coumadin to prevent blood clots. Diffused multiple bruising, petechiae noted. Patient educated on bleeding precautions. Patient compliant and verbalizes understanding. Continue to monitor labs.     Problem: Tissue Perfusion, Cardiopulmonary - Altered  Goal: Hemodynamic stability  Outcome: Progressing toward goal, anticipate improvement over: next 12-24 hours  VSS. Sinus rhythm with first degree AV block. No ectopy noted.S1S2 heard. Bilateral +1 pitting ankle and trace non-pitting mid calf edema present. Skin warm, dry, color appropriate to ethnicity. Patient denies chest pain, palpitations, SOB, or dyspnea. +2 bilateral LE and UE pulses. Cap refill<3sec. Will continue to monitor.     Problem: Fluid Volume Excess  Goal: Absence of edema; peripheral and/or pulmonary  Outcome: Progressing toward goal, anticipate improvement over: next 12-24 hours  Bilateral LE Edema noted. Lungs are clear and diminished at times. Crackles heard at bilateral bases. Encouraged adequate fluid hydration. Patient is on Lasix PO for diuretics. Patient is on Flolan, monitored Q4 and PRN. Will continue to monitor.     Problem: Coping - Ineffective, Patient and Family  Goal: Effective or improved coping  Outcome: Goal Met

## 2016-07-25 NOTE — Progress Notes (Signed)
PULMONARY/CRITICAL CARE ATTENDING NOTE  Current Hospital 10 Days 11 Hours    Subjective:   Walked 4 laps yesterday.  New episode of loose stool today.     Medications:  Scheduled Meds   azelastine  1 spray BID    cholecalciferol  2,000 Units Daily    digoxin  125 mcg QPM    ferrous sulfate  846 mg Daily    folic acid  1 mg Daily    furosemide  100 mg TID    insulin lispro  1-10 Units 4x Daily WC    medroxyPROGESTERone  1.25 mg Daily    predniSONE  40 mg Daily    Prostacyclin Cassette Change   Daily    Prostacyclin Ice Pack   Q6H    Prostacyclin Tubing   Once per day on Mon Wed Fri    Prostacylin Batteries   Once per day on Mon    psyllium  2 Wafer BID    senna  2 tablet HS    sildenafil  80 mg TID    sodium chloride (PF)  3 mL Q8H    sodium chloride (PF)  3 mL Q8H    spironolactone  50 mg Daily     PRN Meds   dextrose  12.5 g PRN    glucagon  1 mg Once PRN    glucose  4 tablet PRN    glucose  1 Tube PRN    nalOXone  0.1 mg Q2 Min PRN    sodium chloride (PF)  3 mL PRN    sodium chloride (PF)  3 mL PRN    sodium chloride   Continuous PRN    sodium chloride   Continuous PRN     IV Meds   epoprostenol (FLOLAN) infusion 35 ng/kg/min (07/25/16 0740)    sodium chloride      sodium chloride         Vitals:    Latest Entry  Range (last 24 hours)    Temperature: (P) 98.1 F (36.7 C)  Temp  Avg: 98.1 F (36.7 C)  Min: 98 F (36.7 C)  Max: 98.5 F (36.9 C)    Blood pressure (BP): (P) 132/52  BP  Min: 101/49  Max: 149/70    Heart Rate: (P) 86  Pulse  Avg: 87.7  Min: 80  Max: 93    Respirations: (P) 22  Resp  Avg: 19.7  Min: 18  Max: 22    SpO2: (P) 98 %  SpO2  Avg: 98 %  Min: 97 %  Max: 99 %       No Data Recorded     Weight: 63.9 kg (140 lb 14 oz)  Percentage Weight Change (%): 0.31 %    Intake/Output Summary (Last 24 hours) at 07/25/16 1013  Last data filed at 07/25/16 0836   Gross per 24 hour   Intake             1903 ml   Output             2325 ml   Net             -422 ml       Exam:      Gen: NAD, chronically ill with muscle wasting.   NECK: + JVD  CV: RRR, + TR no S3  RESP: + Rales at bases bilat  ABD: moderately distended, non tender  EXT: Trace LE edema bilat  NEURO: non-focal, grossly intact  Labs:   CBC  Recent Labs      07/24/16   0608  07/25/16   0620   WBC  6.0  5.9   HGB  10.5*  10.4*   HCT  31.7*  31.4*   PLT  19*  20*      Chemistry  Recent Labs      07/24/16   0608  07/25/16   0620   NA  138  137   K  3.5  3.6   CL  100  100   BICARB  27  26   BUN  94*  86*   CREAT  1.18*  1.25*   GLU  85  77   [place]  10.0  9.9   MG  2.2  2.2          ASSESSMENT/PLAN:  66 yo F with WHO I PAH associated with prior anorexogen exposure and CTD. Admitted with acute on chronic right heart failure, volume overload, AKI and thrombocytopenia.    -Continue PO lasix 100 mg TID.  -Continue sildenafil 60 mg TID and IV epo @35  ng/kg/min  -Continueprednisone to 40 mg QDAY (day 4 of 7)for 1 week then taper to 30 mg x 1 week the taper to 20 mg.  -Bx bx 07/21/16 for thrombocytopenia - results pending. Given time course/clinical presentation suspect related to new medication uloric.  -Continue to follow Cr -Improving with diuresis. Na stable  -Discuss removing foley catheter with patient  -Continue digoxin at lower dose 125 mcg QDAY  -Continue correctional insulin - decreasing req with steroid taper  -Appreciate heme involvement. - pre-lim BM bx consistent with myelofibrosis?  -DNR full care    [name] MD  Pager [phone]

## 2016-07-25 NOTE — Plan of Care (Signed)
Problem: Coping - Ineffective, Patient and Family  Goal: Effective or improved coping  Outcome: Goal Met  Patient is coping well. Patient denies anxiety or stress. Husband at the bedside, supportive and involved in care. Provided distraction methods such as television, humor, music, reading, and walking. Will continue to monitor.

## 2016-07-25 NOTE — Plan of Care (Signed)
Problem: Bleeding, Risk of  Goal: Absence of active bleeding  Outcome: Goal Met  Pt with low platelet counts and admitted with baseline low platelet levels, cause still unknown.  Pt being followed by hematology team.  Monitoring Daily CBC.  No nose bleeds this shift or other s/o bleeding.  Pt with scattered bruising related to flolan therapy and on prednisone taper.  Will continue to monitor.    Problem: Tissue Perfusion, Cardiopulmonary - Altered  Goal: Hemodynamic stability  Outcome: Goal Met  Vitals stable and monitoring q4hrs.  Pt in NSR with occasional PVCs on continuous telemetry monitoring.  Palpable pulses with brisk cap refill.  Pt with +1 edema to bilateral feet/ankles and abdomen.  Pt denies any chest pain/palpitations or SOB and instructed to report if she experiences any of these symptoms.  Flolan infusing through dedicated line at 72 ml/hr, pt tolerating well and back-up pump in room.    Problem: Fluid Volume Excess  Goal: Absence of edema; peripheral and/or pulmonary  Outcome: Unable to meet goal at this time.  Pt with +1 edema to bilateral ankles/feet and abdomen.  Pt transitioned to oral lasix.  Pt diuresing adequate amounts via foley catheter.  Measuring strict I/O's and checking daily weights.  No fluid restriction at this time.

## 2016-07-25 NOTE — Progress Notes (Signed)
Hematology Oncology Daily Progress Note:  07/21/16    Current Hospital Stay:   6 Days 18 Hours    Subjective:  Notes feeling well.  Ambulating.  On oral diuresis.  No F/C.  No hematochezia/melena.    Objective:   epoprostenol (FLOLAN) infusion 35 ng/kg/min (07/25/16 1800)    sodium chloride      sodium chloride        Current Facility-Administered Medications   Medication    azelastine (ASTELIN) 0.1 % nasal spray 1 spray    cholecalciferol (VITAMIN D) tablet 2,000 Units    dextrose 50 % solution 12.5 g    digoxin (LANOXIN) tablet 125 mcg    epoprostenol (FLOLAN) 60,000 ng/mL in glycine diluent 100 mL infusion    ferrous sulfate EC tablet 409 mg    folic acid (FOLVITE) tablet 1 mg    furosemide (LASIX) tablet 100 mg    glucagon (GLUCAGON) injection 1 mg    glucose chewable tablet 16 g    glucose oral gel 1 Tube    insulin lispro (HUMALOG) injection 1-10 Units    medroxyPROGESTERone (PROVERA) tablet 1.25 mg    nalOXone (NARCAN) injection 0.1 mg    predniSONE (DELTASONE) tablet 40 mg    Prostacyclin Cassette Change    Prostacyclin Ice Pack    Prostacyclin Tubing    Prostacylin Batteries    psyllium (METAMUCIL) wafer 2 Wafer    senna (SENOKOT) tablet 17.2 mg    sildenafil (REVATIO) tablet 80 mg **patient own medication**    sodium chloride (PF) 0.9 % flush 3 mL    sodium chloride (PF) 0.9 % flush 3 mL    sodium chloride (PF) 0.9 % flush 3 mL    sodium chloride (PF) 0.9 % flush 3 mL    sodium chloride 0.9 % TKO infusion    sodium chloride 0.9 % TKO infusion    spironolactone (ALDACTONE) tablet 50 mg       Vital Signs:  Temperature:  [98 F (36.7 C)-98.6 F (37 C)] 98.6 F (37 C) (07/13 1938)  Blood pressure (BP): (101-143)/(49-69) 123/66 (07/13 1938)  Heart Rate:  [80-97] 81 (07/13 1938)  Respirations:  [17-27] 18 (07/13 1938)  Pain Score: 0 (07/13 1938)  O2 Device: Nasal cannula (07/13 1938)  O2 Flow Rate (L/min):  [3 l/min] 3 l/min (07/13 1938)  SpO2:  [96 %-100 %] 99 % (07/13  1938)  07/12 0600 - 07/13 0559  In: 1258.6 [P.O.:1250; I.V.:8.6]  Out: 2325 [Urine:2325]  Date Weight Recorded 07/24/2016 07/23/2016 07/22/2016 07/21/2016 07/20/2016 07/19/2016   Metric 63.9 kg 63.7 kg 63.1 kg 62.8 kg 62.8 kg 62.2 kg   Pounds/Ounces 140 lb 14 oz 140 lb 6.9 oz 139 lb 1.8 oz 138 lb 7.2 oz 138 lb 7.2 oz 137 lb 2 oz        Physical Exam:  Gen: WDWN female in NAD, NC in place  HEENT: plethora, supple  CV: RRR, soft systolic II/VI murmur  Pulm: CTAB  Abd: soft, NDNT, palpable spleen tip, liver palpable 1-2 cm below R costophrenic angle  Ext: no c/c/e  Skin: flushing on face/trunk, blanching, scattered ecchymoses on upper extremities    Laboratory data:   Recent Labs      07/23/16   0538  07/24/16   0608  07/25/16   0620   NA  134*  138  137   K  3.1*  3.5  3.6   CL  96*  100  100   BICARB  _0 BUN  106*  94*  86*   CREAT  1.29*  1.18*  1.25*   [place]  9.9  10.0  9.9   MG  2.2  2.2  2.2     Recent Labs      07/23/16   0538  07/24/16   0608  07/25/16   0620   WBC  5.5  6.0  5.9   HGB  10.1*  10.5*  10.4*   HCT  31.8*  31.7*  31.4*   MCV  79.7  79.6  80.3   PLT  19*  19*  20*     No results for input(s): PTT, INR in the last 72 hours.    Pathology:    BONE MARROW:  -Hypercellular marrow with trilineage hematopoiesis  -Morphologic features suspicious of myeloproliferative neoplasm (MPN; see  Interpretation)  -Focally mild reticulin fibrosis (WHO grade I)    PERIPHERAL BLOOD:  -Normocytic normochromic anemia with circulating nucleated red cells  (1/100WBC) and increased teardrop cells.  -Circulating immature granulocytes (4%)  -Thrombocytopenia.    INTERPRETATION:  The peripheral blood has circulating immature granulocytes, nucleated RBCs,  and increased teardrop cells consistent with leukoerythroblastic changes.  The marrow is hypercellular with trilineage hematopoiesis and  with  megakaryocyte hyperplasia, atypia and clustering. Focally, mild reticulin  fibrosis is present (mostly WHO grade0 but focally WHO  grade 1).  Flow  cytometry reveals NO evidence for increased or aberrant myeloblasts.  The  overall morphologic features are suspicious for a myeloproliferative  neoplasm (MPN), and MPN has been associated with pulmonary hypertension*.  Please correlate clinically and with pending molecular studies (NGS heme  malignancies panel on blood). Preliminary results were communicated to Dr  Delsa Grana (hematology) at 15:10 on 07/23/2016 (HEB).  * Pulmonary hypertension in patients with chronic myeloproliferative  disorders    PERIPHERAL SMEAR:  Red blood cells: Normocytic, normochromic anemia with moderate  anisopoikilocytosis and increased teardrop cells. Rare nucleated RBCs are  present.  White blood cells: Within normal range with a normal differential. 4%  immature granulocytes are present, but .there are no circulating blasts.  Platelets: Markedly decreased with adequate morphology  ASPIRATE SMEAR:  Quality: Inadequate due to lack of spicules  Spicules: Absent    TOUCH PREP:  Quality: Adequate  Spicules: Present  Myeloid: Normal morphology and maturation  Erythroid: Normal morphology and maturation  Myeloid/Erythroid ratio: Within normal limits (1.7:1)  Megakaryocytes: Rare megakaryocytes present with normal morphology  Lymphocytes: Normal  Plasma cells: Normal in number with adequate morphology  A 200-cell differential count yields:  3% blasts  1% promyelocytes  20% myelocytes  17% metamyelocytes  21% bands and segmented neutrophils  0% eosinophils  0% plasma cells  2% lymphocytes  1% monocytes  35% erythroid precursors  CORE BIOPSY:  Size: 1.6 cm  Quality: Adequate  Cellularity: 70%, hypercellular for age  Myeloid: Increased  Erythroid: Increased  Megakaryocytes: Increased in number with uneven distribution and  clustering. Megakaryocytes have atypical nuclear features that are "cloud  like".  Lymphoid: Normal  Plasma cells: Normal in number  Other: No metastatic disease, granulomas, or viral inclusions  identified    SPECIAL STAINS:  Stains on the core reveal: There are no increased blasts highlighted by  CD34 and CD117 stain, with CD34+ blasts comprising less than 1% of total  marrow cellularity. CD61 confirms increased presence of megakaryocytes with  clustering. CD138 highlights individual scattered plasma cells comprising  3-5% of total cellularity. Focally  mild reticulin fibrosis is present  (mostly WHO grade 0 with focal WHO grade I). Trichrome is negative for  fibrosis.  FLOW CYTOMETRY SSESSMENT (see separate report for details):  The sample for the current flow cytometry analysis was received 07/21/2016  and stained on 07/22/2016.  Due to limited sample, viability was not determined, and an abbreviated  panel was tested.  Flow cytometric analysis was performed on a minimum of 19147 cells for each  antibody.  The lysed bone marrow core was stained with antibodies directed against the  following antigens:CD3, CD4, CD5, CD8, CD10, CD19, CD38, CD45, sKappa,  sLambda.  FLOW CYTOMETRY RESULTS:   Of the total cells:  75% are leukocytes  (CD45+).  2.5% are B-lymphocytes (CD19+, CD20+) with kappa to lambda immunoglobulin  light chain ratio of 2:1 (surface).  16% are T-lymphocytes (CD3+, CD5+) with a CD4:CD8 ratio of 2:1.  CYTOGENETICS:  Cytogenetics analysis is pending and the results will be reported in an  addendum.    Assessment   Diana Chambers is a 66 year old female who has a history of PAH WHO group 1, CKD, presenting with volume overload and AKI found to have worsening thrombocytopenia.    # Thrombocytopenia: only new medications introduced to patient were dapsone and uloric with only rare reports of these drugs causing antibody mediated platelet destruction.  Patient underwent 5 day trial of high dose steroids with no response, making ITP still possible however unlikely.  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  Patient to f/u with hematology in outpatient setting to review remaining results of bone marrow biopsy and peripheral blood evaluation.  As long as patient without significant bleeding and with platelets stable, no need for further treatment at this time.    Recommendations:  - We will refer patient for outpatient hematology with Dr. Oretha Ellis (MPN specialist).  Ideally would coordinate as same day/adjacent day of Dr. Ozzie Hoyle clinic to make travel easier    Seen and discussed with attending, Dr. Norval Morton.    Kriste Basque, MD  Hematology/Oncology Fellow  Pager [phone]    Patient seen and examined with fellow. Agree with history, physical exam, findings, assessment and plan.  Debbe Mounts, MD, Hematology Attending    We had a long discussion with patient and husband regarding the marrow diagnosis. They had many questions and concerns and had read much on the internet. We explained the disease spectrum of MPNs and myelofibrosis in lay terms. We also emphasized that it is too early for a final diagnosis, prognosis and treatment plan pending final results. We explained that this will follow as outpatient with the expert in this field, Dr. Oretha Ellis. Patient understanding and thankful for the time we spent with them to explain the situation.There is no super acute urgency for treatment and action. The should f/u with Heme (Dr.J) within the next 2-3 weeks,    We will sign off. Please page for any additional questions.

## 2016-07-25 NOTE — Progress Notes (Signed)
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      Subjective:   Diuresed -1L   Switched to oral lasix in the evening   Walked 4 laps yesterday  Was able to sleep better last night  Feeling in better spirits this morning     Objective  Temperature:  [98 F (36.7 C)-98.5 F (36.9 C)] (P) 98.1 F (36.7 C) (07/13 0754)  Blood pressure (BP): (101-149)/(49-70) (P) 132/52 (07/13 0754)  Heart Rate:  [80-93] (P) 86 (07/13 0754)  Respirations:  [18-22] (P) 22 (07/13 0754)  Pain Score: (P) 0 (07/13 0754)  O2 Device: (P) Nasal cannula (07/13 0754)  O2 Flow Rate (L/min):  [3 l/min] (P) 3 l/min (07/13 0754)  SpO2:  [97 %-99 %] (P) 98 % (07/13 0754)    Intake/Output Summary (Last 24 hours) at 07/25/16 1320  Last data filed at 07/25/16 1300   Gross per 24 hour   Intake          1453.05 ml   Output             1950 ml   Net          -496.95 ml       Physical Exam  General: NAD, sitting in chair, comfortable   Chest: L tunneled IJ   CV: RRR, 3/6 systolic TR murmur  Lungs: CTAB  Abd: soft, nontender, mildly distended  Ext: trace edema bilateral LEs   Skin: flushing on face/trunk, scattered bruising/ecchymosis on upper extremities, improved mild petechiae/flolan rash on LEs    Labs  Lab Results   Component Value Date    WBC 5.9 07/25/2016    RBC 3.91 07/25/2016    HGB 10.4 (L) 07/25/2016    HCT 31.4 (L) 07/25/2016    MCV 80.3 07/25/2016    MCHC 33.1 07/25/2016    RDW 18.7 (H) 07/25/2016    PLT 20 (L) 07/25/2016    PLT 179 09/12/2008    MPV 12.4 06/12/2016     Lab Results   Component Value Date    NA 137 07/25/2016    K 3.6 07/25/2016    CL 100 07/25/2016    BICARB 26 07/25/2016    BUN 86 (H) 07/25/2016    CREAT 1.25 (H) 07/25/2016    GLU 77 07/25/2016    [place] 9.9 07/25/2016     No results found for: INR, PTT  Uric acid 11  ESR 32, CRP 1.70   C3 88, C4 18   BNPP 54098 --> 10697.     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     # [place]   # Acute on chronic RV failure - Overall much improved since admission. Switched to po diuretics last night but had received 3 doses of IV lasix in the morning.   - Lasix 100 mg po TID. Will assess response and adjust as needed   - Continue PAH therapies (epoprostenol 35 ng/kg/min and sildenafil 80  mg TID)  - Continue spironolactone 50 mg daily   - Continue digoxin 125 mcg.  - Continue Prednisone 40 mg daily (7/10-). Will taper by 10 mg qweek (start 30 mg on 7/17)    # Thrombocytopenia - Platelets remain stably low. 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 Performing further stains now.   - Continue to hold Uloric and Dapsone   - Heme/onc following  - Awaiting further confirmatory stains for myelofibrosis  - Follow up platelet autoantibody panel, drug dependent anti-platelet antibodies to uloric, soluble transferrin receptor, NGS hematologic malignancy panel  - Continue prednisone    # AKI - Resolved. Baseline creatinine 1.0-1.2.  - Continue diuresis, as above  - Avoid nephrotoxins     # Elevated glucose - Due to high-dose steroids.   - SSI     # Hyponatremia - 2/2 hypervolemia. Resolved.    - Continue to diurese, as above     # CTD, SLE - Stable. Continue steroids, as above . Resolved.    - Continue to diurese, as above     # FEN: low salt  # PPX: contraindicated     Code: DNR/Full care     This patient was seen and discussed with Dr. [name].    Lucy Antigua, MD  Fellow, Pulmonary & Critical Care Medicine

## 2016-07-26 LAB — GLUCOSE (POCT)
Glucose (POCT): 83 mg/dL (ref 70–99)
Glucose (POCT): 118 mg/dL — ABNORMAL HIGH (ref 70–99)
Glucose (POCT): 168 mg/dL — ABNORMAL HIGH (ref 70–99)
Glucose (POCT): 174 mg/dL — ABNORMAL HIGH (ref 70–99)

## 2016-07-26 LAB — HEMOGRAM, BLOOD
Hct: 31 % — ABNORMAL LOW (ref 34.0–45.0)
Hgb: 9.8 gm/dL — ABNORMAL LOW (ref 11.2–15.7)
MCH: 25.5 pg — ABNORMAL LOW (ref 26.0–32.0)
MCHC: 31.6 g/dL — ABNORMAL LOW (ref 32.0–36.0)
MCV: 80.5 um3 (ref 79.0–95.0)
NRBC: 1 /100 WBC (ref <1)
Plt Count: 24 10*3/uL — ABNORMAL LOW (ref 140–370)
RBC: 3.85 10*6/uL — ABNORMAL LOW (ref 3.90–5.20)
RDW: 18.4 % — ABNORMAL HIGH (ref 12.0–14.0)
WBC: 5.2 10*3/uL (ref 4.0–10.0)

## 2016-07-26 LAB — BASIC METABOLIC PANEL, BLOOD
Anion Gap: 11 mmol/L (ref 7–15)
BUN: 79 mg/dL — ABNORMAL HIGH (ref 8–23)
Bicarbonate: 25 mmol/L (ref 22–29)
Calcium: 9.8 mg/dL (ref 8.5–10.6)
Chloride: 102 mmol/L (ref 98–107)
Creatinine: 1.17 mg/dL — ABNORMAL HIGH (ref 0.51–0.95)
GFR: 46 mL/min
Glucose: 86 mg/dL (ref 70–99)
Potassium: 3.9 mmol/L (ref 3.5–5.1)
Sodium: 138 mmol/L (ref 136–145)

## 2016-07-26 LAB — MAGNESIUM, BLOOD: Magnesium: 2.4 mg/dL (ref 1.6–2.4)

## 2016-07-26 MED ORDER — POTASSIUM CHLORIDE CRYS CR 10 MEQ OR TBCR
40.00 meq | EXTENDED_RELEASE_TABLET | Freq: Once | ORAL | Status: AC
Start: 2016-07-26 — End: 2016-07-26
  Administered 2016-07-26: 40 meq via ORAL
  Filled 2016-07-26: qty 4

## 2016-07-26 NOTE — Plan of Care (Signed)
Problem: Bleeding, Risk of  Goal: Absence of active bleeding  Outcome: Goal Met    Pt Diana Chambers, 66 year old, female admitted with low platelet count. Hematology following- no signs of active bleeding. Pt verbalized understanding for S/S of bleeding and to notify RN with any changes. Skin red at baseline with ecchymosis. Will continue to monitor for bleeding.    No results found for: INR, PTT    Lab Results   Component Value Date    WBC 5.2 07/26/2016    RBC 3.85 (L) 07/26/2016    HGB 9.8 (L) 07/26/2016    HCT 31.0 (L) 07/26/2016    MCV 80.5 07/26/2016    MCHC 31.6 (L) 07/26/2016    RDW 18.4 (H) 07/26/2016    PLT 24 (L) 07/26/2016    PLT 179 09/12/2008    MPV 12.4 06/12/2016       Problem: Tissue Perfusion, Cardiopulmonary - Altered  Goal: Hemodynamic stability  Outcome: Goal Met    Pt Diana Chambers, 66 year old, female, with skin is normal for ethnicity, warm, and dry. Pt denies CP, palpitations, SOB or dyspnea. Palpable peripheral pulses in all extremities, full sensation, minimal edema.VSS- please see documentation flowsheet, rapid capillary refill. Pt denies dizziness at rest or with ambulation. Verbalizes understanding to notify RN for SOB/CP immediately or numbness/ tingling sensations. S1/S2 heard, SR on telemetry with occasional ectopy. K replaced this AM, tolerating PO.    Flolan checked at shift change, Q4h, PRN and with any changes. Flolan settings verified via power module, CADD pump stabilized and in safety pouch with ice cooling, batteries checked, x1 extra CADD at beside, Flolan orders verified, paper taped to door. Will continue to monitor.    Will continue to monitor.      Temperature:  [97.8 °F (36.6 °C)-98.6 °F (37 °C)] 98.4 °F (36.9 °C) (07/14 0756)  Blood pressure (BP): (113-143)/(51-69) 125/65 (07/14 0907)  Heart Rate:  [81-97] 85 (07/14 0907)  Respirations:  [17-27] 27 (07/14 0756)  Pain Score: 0 (07/14 0756)  O2 Device: Nasal cannula (07/14 0756)  O2 Flow Rate (L/min):  [3  l/min] 3 l/min (07/14 0756)  SpO2:  [96 %-100 %] 98 % (07/14 0756)    Date Weight Recorded 07/26/2016 07/24/2016 07/23/2016 07/22/2016 07/21/2016 07/20/2016   Metric 64.4 kg 63.9 kg 63.7 kg 63.1 kg 62.8 kg 62.8 kg   Pounds/Ounces 141 lb 15.6 oz 140 lb 14 oz 140 lb 6.9 oz 139 lb 1.8 oz 138 lb 7.2 oz 138 lb 7.2 oz        Problem: Fluid Volume Excess  Goal: Absence of edema; peripheral and/or pulmonary  Outcome: Goal Met    Pt Diana Chambers, 66 year old, female with minimal edema overall, improving significantly per patient. Fine crackles noted to lungs- bilateral bases. Encourage adequate fluid hydration and educated regarding s/s of FVO and to call if any respiratory distress and/or swelling occurs. Pt transitioned to PO lasix and aldactone for diuresing. Adequate UO, no retention since removal of foley yesterday 7/13.  Will continue to monitor.    Lab Results   Component Value Date    NA 138 07/26/2016    K 3.9 07/26/2016    CL 102 07/26/2016    BICARB 25 07/26/2016    BUN 79 (H) 07/26/2016    CREAT 1.17 (H) 07/26/2016    GLU 86 07/26/2016    CA 9.8 07/26/2016     Lab Results   Component Value Date      BNP 53 10/09/2008    BNPP 10697 (H) 07/21/2016       Yesterday's intake and output:  07/13 0600 - 07/14 0559  In: 2271.1 [P.O.:2200; I.V.:71.1]  Out: 2325 [Urine:2325]    Weights (last 14 days)     Date/Time Weight Weight Source Percentage Weight Change (%) Who    07/26/16 0022 64.4 kg (141 lb 15.6 oz) Standing scale 0.78 % SB    07/24/16 0526 63.9 kg (140 lb 14 oz) Standing scale 0.31 % SB    07/23/16 0609 63.7 kg (140 lb 6.9 oz) Standing scale 0.95 % AG    07/22/16 0400 63.1 kg (139 lb 1.8 oz) Standing scale 0.48 % RS    07/21/16 0546 62.8 kg (138 lb 7.2 oz) Standing scale 0 % LC    07/20/16 0525 62.8 kg (138 lb 7.2 oz) Standing scale 0.96 % LC    07/19/16 0451 62.2 kg (137 lb 2 oz) Standing scale -0.32 % TC    07/18/16 0527 62.4 kg (137 lb 9.1 oz) Standing scale -0.95 % MM    07/17/16 0327 63 kg (138 lb 14.2 oz)  Standing scale 0.64 % RS    07/16/16 0248 62.6 kg (138 lb 0.1 oz) Standing scale -3.49 % KM    07/14/16 2242 64.9 kg (143 lb) Standing scale 0 % BS              Problem: Coping - Ineffective, Patient and Family  Goal: Effective or improved coping  Outcome: Goal Met  Husband at bedside- expresses worry about his wife's condition. Helpful, calm, and cooperative.

## 2016-07-26 NOTE — Progress Notes (Signed)
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    Subjective:  Patient reports feeling much better from when she came in. She denies chest pain or shortness of breath. She denies nausea, vomiting, lightheadedness, dizziness, fevers, or chills. No bleeding, hematuria, hemoptysis, or hematemesis.     ROS reviewed and unchanged    I/O:  Intake/Output       07/25/16 0600 - 07/26/16 0559 07/26/16 0600 - 07/27/16 0559      [id number] 6962-9528 Total 0600-1759 4132-4401 Total       Intake    P.O.  2000  200 2200  100  -- 100    I.V.  65.1  6 71.1  12  -- 12    Total Intake 2065.1 206 2271.1 112 -- 112       Output    Urine  1225  1100 2325  --  -- --    Total Output 1225 1100 2325 -- -- --       Net I/O     840.1 -894 -53.9 112 -- 112        PE:  Temperature:  [97.8 F (36.6 C)-98.6 F (37 C)] 98.4 F (36.9 C) (07/14 0756)  Blood pressure (BP): (113-143)/(51-69) 125/65 (07/14 0907)  Heart Rate:  [81-97] 85 (07/14 0907)  Respirations:  [17-27] 27 (07/14 0756)  Pain Score: 0 (07/14 0756)  O2 Device: Nasal cannula (07/14 0756)  O2 Flow Rate (L/min):  [3 l/min] 3 l/min (07/14 0756)  SpO2:  [96 %-100 %] 98 % (07/14 0756)  Physical Exam   Constitutional: She is oriented to person, place, and time. No distress.   HENT:   Head: Normocephalic and atraumatic.   Eyes: Right eye exhibits no discharge. Left eye exhibits no discharge. No scleral icterus.   Neck: Normal range of motion. Neck supple. JVD (~8-10cm) present.   Cardiovascular: Normal rate and regular rhythm.    Murmur heard.  Pulmonary/Chest: Effort normal. No respiratory distress. She has no wheezes. She has rales (trace scattered).   Left chest wall single lumen catheter   Abdominal: Soft. Bowel sounds are normal. She exhibits no distension. There is no tenderness.   Musculoskeletal: She exhibits edema (RLE +1, LLE trace to +1).      Neurological: She is alert and oriented to person, place, and time.   Skin: Skin is warm and dry. Rash (petechial rash on medial lower legs, erythema over neck and chest) noted. She is not diaphoretic.   Psychiatric: Affect normal.       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     Plan:  # [place]   # Acute on chronic RV failure - Continuing to improve with diuresis.   - Cont lasix 100 mg po TID with goal of even to -58m   - Continue PAH therapies (epoprostenol 35 ng/kg/min and sildenafil 80 mg TID)  - Continue spironolactone 50 mg daily   - Continue digoxin 125 mcg.  - Continue Prednisone 40 mg daily (7/10-). Will taper by 10 mg qweek (start 30 mg on 7/17)    # Thrombocytopenia - Platelets improved to 24 today from 20. 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 Performing further stains now.   - Continue to hold Uloric and Dapsone   - Heme/onc following  - Awaiting further confirmatory stains for myelofibrosis  - Follow up platelet autoantibody panel, drug dependent anti-platelet antibodies to uloric, soluble transferrin receptor, NGS hematologic malignancy panel  - Continue prednisone    # AKI - Resolved. Baseline creatinine 1.0-1.2.  - Continue diuresis, as above  - Avoid nephrotoxins     # Elevated glucose - Due to high-dose steroids.   - SSI     # Hyponatremia - 2/2 hypervolemia. Resolved. Sodium 138 today  - Continue to diurese, as above     # CTD, SLE - Stable. Continue steroids, as above . Resolved.    - Continue to diurese, as above     # FEN: low salt  #  PPX: contraindicated 2/2 thrombocytopenia, ambulatory    Code: DNR/Full care     JCatha Gosselin MD, MPH  Pulmonary/Critical Care Fellow  Pager # [phone]   Pulm Vascular Attending: Seen and examined with Dr. BAlvester Chou agree with his hsitory, PE, assessment and plan . She is feeling much better c/w when I saw her last week. Up in chair eating breakfast. BP 135/64   Pulse 82   Temp 98.4 F (36.9 C)   Resp 27   Ht 5' 2"  (1.575 m)   Wt 64.4 kg (141 lb 15.6 oz)   SpO2 98%   BMI 25.97 kg/m2  I/O even. Trace presacral edema and pretib edema. Few crackles at base. RRR with systolic murmur, no S3. Line site clean    1. PAH with volume overload and RV failure: doing much better with diuresis. In process . No change in epoprostenol and 35 ng/kg/min or sildenafil80 mg tid. Continue lasix 100 mg tid and aldactone 50 mg daily and watch fluid balance.  2. Thrombocytopenia: slight increase from yesterday.Continue prednisone 40 mg daily with plan to taper down 10 mg /week. Await results of testing and guidance from hematology.    KRise Mu MD

## 2016-07-26 NOTE — Plan of Care (Signed)
Problem: Bleeding, Risk of  Goal: Absence of active bleeding  Outcome: Goal Met  Pt with low platelet counts and admitted with baseline low platelet levels, cause still unknown. Pt being followed by hematology team. Monitoring Daily CBC. No nose bleeds this shift or other s/o bleeding. Pt with scattered bruising related to flolan therapy and on prednisone taper. Will continue to monitor.    Problem: Tissue Perfusion, Cardiopulmonary - Altered  Goal: Hemodynamic stability  Outcome: Goal Met  Vitals stable and monitoring q4hrs. Pt in NSR with no ectopy on continuous telemetry monitoring. Palpable pulses with brisk cap refill. Pt with +1 edema to bilateral feet/ankles and abdomen. Pt denies any chest pain/palpitations or SOB and instructed to report if she experiences any of these symptoms. Flolan infusing through dedicated line at 72 ml/hr, pt tolerating well and back-up pump in room.    Problem: Fluid Volume Excess  Goal: Absence of edema; peripheral and/or pulmonary  Outcome: Unable to meet goal at this time.  Pt with +1 edema to bilateral ankles/feet and abdomen. Pt transitioned to oral lasix. Pt diuresing adequate amounts and able to void without difficulty since foley removed 7/13. Measuring strict I/O's and checking daily weights. No fluid restriction at this time.

## 2016-07-27 LAB — HEMOGRAM, BLOOD
Hct: 32.2 % — ABNORMAL LOW (ref 34.0–45.0)
Hgb: 10.3 gm/dL — ABNORMAL LOW (ref 11.2–15.7)
MCH: 25.8 pg — ABNORMAL LOW (ref 26.0–32.0)
MCHC: 32 g/dL (ref 32.0–36.0)
MCV: 80.5 um3 (ref 79.0–95.0)
Plt Count: 29 10*3/uL — ABNORMAL LOW (ref 140–370)
RBC: 4 10*6/uL (ref 3.90–5.20)
RDW: 18.6 % — ABNORMAL HIGH (ref 12.0–14.0)
WBC: 5.7 10*3/uL (ref 4.0–10.0)

## 2016-07-27 LAB — MAGNESIUM, BLOOD
Magnesium: 2.4 mg/dL (ref 1.6–2.4)
Magnesium: 2.3 mg/dL (ref 1.6–2.4)

## 2016-07-27 LAB — BASIC METABOLIC PANEL, BLOOD
Anion Gap: 10 mmol/L (ref 7–15)
Anion Gap: 10 mmol/L (ref 7–15)
BUN: 74 mg/dL — ABNORMAL HIGH (ref 8–23)
BUN: 72 mg/dL — ABNORMAL HIGH (ref 8–23)
Bicarbonate: 25 mmol/L (ref 22–29)
Bicarbonate: 25 mmol/L (ref 22–29)
Calcium: 9.6 mg/dL (ref 8.5–10.6)
Calcium: 9.8 mg/dL (ref 8.5–10.6)
Chloride: 102 mmol/L (ref 98–107)
Chloride: 102 mmol/L (ref 98–107)
Creatinine: 1.07 mg/dL — ABNORMAL HIGH (ref 0.51–0.95)
Creatinine: 1.07 mg/dL — ABNORMAL HIGH (ref 0.51–0.95)
GFR: 51 mL/min
GFR: 51 mL/min
Glucose: 133 mg/dL — ABNORMAL HIGH (ref 70–99)
Glucose: 75 mg/dL (ref 70–99)
Potassium: 3.8 mmol/L (ref 3.5–5.1)
Potassium: 3.5 mmol/L (ref 3.5–5.1)
Sodium: 137 mmol/L (ref 136–145)
Sodium: 137 mmol/L (ref 136–145)

## 2016-07-27 LAB — GLUCOSE (POCT)
Glucose (POCT): 79 mg/dL (ref 70–99)
Glucose (POCT): 117 mg/dL — ABNORMAL HIGH (ref 70–99)
Glucose (POCT): 174 mg/dL — ABNORMAL HIGH (ref 70–99)
Glucose (POCT): 165 mg/dL — ABNORMAL HIGH (ref 70–99)

## 2016-07-27 LAB — PHOSPHORUS, BLOOD: Phosphorous: 3.1 mg/dL (ref 2.7–4.5)

## 2016-07-27 MED ORDER — POTASSIUM CHLORIDE CRYS CR 10 MEQ OR TBCR
40.00 meq | EXTENDED_RELEASE_TABLET | ORAL | Status: AC
Start: 2016-07-27 — End: 2016-07-27
  Administered 2016-07-27 (×2): 40 meq via ORAL
  Filled 2016-07-27 (×2): qty 4

## 2016-07-27 MED ORDER — SPIRONOLACTONE 25 MG OR TABS
25.00 mg | ORAL_TABLET | Freq: Once | ORAL | Status: AC
Start: 2016-07-27 — End: 2016-07-27
  Administered 2016-07-27: 25 mg via ORAL
  Filled 2016-07-27: qty 1

## 2016-07-27 MED ORDER — SPIRONOLACTONE 25 MG OR TABS
75.0000 mg | ORAL_TABLET | Freq: Every day | ORAL | Status: DC
Start: 2016-07-28 — End: 2016-07-28
  Administered 2016-07-28: 75 mg via ORAL
  Filled 2016-07-27: qty 3

## 2016-07-27 NOTE — Progress Notes (Signed)
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    Subjective:  Interval events - overnight, pulm fellow notified for possible skipped beats on telemetry. Patient asymptomatic  The patient reports feeling better and denies chest pain, shortness of breath, nausea, vomiting, fevers, chills, abdominal pain, constipation or diarrhea. She denies lightheadedness, dizziness, or bleeding.    ROS reviewed and unchanged    I/O:  Intake/Output       07/26/16 0600 - 07/27/16 0559 07/27/16 0600 - 07/28/16 0559      [id number] 3016-0109 Total 0600-1759 3235-5732 Total       Intake    P.O.  300  300 600  200  -- 200    I.V.  36.1  9 45.1  --  -- --    Total Intake 336.1 309 645.1 200 -- 200       Output    Urine  400  1375 1775  --  -- --    Total Output 400 1375 1775 -- -- --       Net I/O     -63.9 -1066 -1129.9 200 -- 200          PE:  Temperature:  [97.9 F (36.6 C)-98.1 F (36.7 C)] 98 F (36.7 C) (07/15 0747)  Blood pressure (BP): (122-142)/(61-75) 142/61 (07/15 0747)  Heart Rate:  [75-89] 89 (07/15 0747)  Respirations:  [16-23] 23 (07/15 0747)  Pain Score: 0 (07/15 0747)  O2 Device: Nasal cannula (07/15 0747)  O2 Flow Rate (L/min):  [3 l/min] 3 l/min (07/15 0747)  SpO2:  [97 %-100 %] 100 % (07/15 0747)  Physical Exam   Constitutional: She is oriented to person, place, and time. No distress.   HENT:   Head: Normocephalic and atraumatic.   Eyes: Right eye exhibits no discharge. Left eye exhibits no discharge. No scleral icterus.   Neck: Normal range of motion. Neck supple. JVD (~8cm with mildly positive AJR) present.   Cardiovascular: Normal rate and regular rhythm.    Murmur heard.  Pulmonary/Chest: Effort normal. No respiratory distress. She has no wheezes.   Left > Right basilar crackles   Abdominal: Soft. Bowel sounds are normal. She exhibits no distension. There is no tenderness.      Musculoskeletal: She exhibits edema (trace bilateral LE edema).   Neurological: She is alert and oriented to person, place, and time.   Skin: Skin is warm and dry. She is not diaphoretic.   Petechial rash in bilateral medial legs  Erythema over chest   Psychiatric: Affect normal.       Current Labs  Recent Labs      07/27/16   0607   WBC  5.7   RBC  4.00   HGB  10.3*   HCT  32.2*   MCV  80.5   MCHC  32.0   RDW  18.6*   PLT  29*     Recent Labs      07/27/16   0607   NA  137   K  3.5   CL  102   BICARB  25   BUN  72*   CREAT  1.07*   GLU  75   [place]  9.8       Assessment:  Dorthie Santini is a 17 year [place] old female with history of SLE and PAH 2/2 CTD and  prior anorexigen use who presented with nose bleeds and fatigue, found to be in RV failure.     Plan:  # [place]   # Acute on chronic RV failure - Continuing to improve with diuresis.   - Contlasix 100 mg po TID with goal of even to -54m   - Continue PAH therapies (epoprostenol 35 ng/kg/min and sildenafil 80 mg TID)  - Continue spironolactone 50 mg daily, can consider increasing to 1053mdaily   - Continue digoxin 125 mcg.  - Continue Prednisone 40 mg daily (7/10-). Will taper by 10 mg qweek (start 30 mg on 7/17)    # Thrombocytopenia - Platelets improved to 29 today from 24. 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 Performing further stains now.   - Continue to hold Uloric and Dapsone   - Heme/onc following  - Awaiting further confirmatory stains for myelofibrosis  - Follow up platelet autoantibody panel, drug dependent anti-platelet antibodies to uloric, soluble transferrin receptor, NGS hematologic malignancy panel  - Continue prednisone    # AKI - Resolved.Baseline creatinine 1.0-1.2.  - Continue diuresis, as above  - Avoid nephrotoxins     # Elevated glucose - Due to high-dose steroids.   - SSI     # Hyponatremia - 2/2 hypervolemia. Resolved. Sodium 137 today  - Continue to diurese, as above     # CTD, SLE - Stable.  Continue steroids, as above . Resolved.   - Continue to diurese, as above     # FEN: low salt  # PPX: contraindicated 2/2 thrombocytopenia, ambulatory    Code: DNR/Full care     JeCatha GosselinMD, MPH  Pulmonary/Critical Care Fellow  Pager # [phone]: Seen and examined with Dr. BaAlvester Chouagree with his history, PE, assessment and plan. Walked 12 laps in hall yesterday, feeling pretty good. BP 133/71   Pulse 96   Temp 98.2 F (36.8 C)   Resp 16   Ht 5' 2"  (1.575 m)   Wt 64.4 kg (141 lb 15.6 oz)   SpO2 98%   BMI 25.97 kg/m2  Lungs with few rhonchi at bases  Cor: RRR with accen P2 and Tr murmur, no S3  Line site clean  No edema  I/o -1.1L    BUN and Cr stable  Platelets slowly increasing: 29k today    1. PAH with volume overload and RV failure: doing much better with diuresis. . Marland Kitcheno change in epoprostenol and 35 ng/kg/min or sildenafil80 mg tid. Continue lasix 100 mg tid and increase aldactone to 75 mg daily given need for frequent KCl replacement  2. Thrombocytopenia: slight increase from yesterday.Continue prednisone 40 mg daily with plan to taper down 10 mg /week. Await results of testing and guidance from hematology.    KiRise MuMD

## 2016-07-27 NOTE — Plan of Care (Signed)
Problem: Bleeding, Risk of  Goal: Absence of active bleeding  Pt Diana Chambers, 66 year old, female admitted with low platelet count. Hematology following- no signs of active bleeding, platelet count slowly improving. Pt verbalized understanding for S/S of bleeding and to notify RN with any changes. Skin red at baseline with ecchymosis. Will continue to monitor for bleeding.    No results found for: INR, PTT    Lab Results   Component Value Date    WBC 5.7 07/27/2016    RBC 4.00 07/27/2016    HGB 10.3 (L) 07/27/2016    HCT 32.2 (L) 07/27/2016    MCV 80.5 07/27/2016    MCHC 32.0 07/27/2016    RDW 18.6 (H) 07/27/2016    PLT 29 (L) 07/27/2016    PLT 179 09/12/2008    MPV 12.4 06/12/2016       Problem: Tissue Perfusion, Cardiopulmonary - Altered  Goal: Hemodynamic stability  Outcome: Goal Met  Pt Diana Chambers, 66 year old, female, with skin is normal for ethnicity, warm, and dry. Pt denies CP, palpitations, SOB or dyspnea. Palpable peripheral pulses in all extremities, full sensation, minimal edema.VSS- please see documentation flowsheet, rapid capillary refill. Pt denies dizziness at rest or with ambulation. Verbalizes understanding to notify RN for SOB/CP immediately or numbness/ tingling sensations. S1/S2 heard, SR on telemetry with occasional ectopy. K replaced today, tolerating PO. Continuing to diurese with lasix and aldactone PO.    Flolan checked at shift change, Q4h, PRN and with any changes. Flolan settings verified via power module, CADD pump stabilized and in safety pouch with ice cooling, batteries checked, x1 extra CADD at beside, Flolan orders verified, paper taped to door. Will continue to monitor.    Will continue to monitor.      Temperature:  [97.9 F (36.6 C)-98.2 F (36.8 C)] 98.2 F (36.8 C) (07/15 1134)  Blood pressure (BP): (122-142)/(61-75) 133/71 (07/15 1206)  Heart Rate:  [75-96] 96 (07/15 1206)  Respirations:  [16-23] 16 (07/15 1134)  Pain Score: 0 (07/15 1134)  O2 Device:  Nasal cannula (07/15 1134)  O2 Flow Rate (L/min):  [3 l/min] 3 l/min (07/15 1134)  SpO2:  [98 %-100 %] 98 % (07/15 1134)    Date Weight Recorded 07/26/2016 07/24/2016 07/23/2016 07/22/2016 07/21/2016 07/20/2016   Metric 64.4 kg 63.9 kg 63.7 kg 63.1 kg 62.8 kg 62.8 kg   Pounds/Ounces 141 lb 15.6 oz 140 lb 14 oz 140 lb 6.9 oz 139 lb 1.8 oz 138 lb 7.2 oz 138 lb 7.2 oz        Problem: Fluid Volume Excess  Goal: Absence of edema; peripheral and/or pulmonary  Outcome: Goal Met  Pt Diana Chambers, 66 year old, female with minimal edema overall, improving significantly per patient. Fine crackles noted to lungs- bilateral bases. Encourage adequate fluid hydration and educated regarding s/s of FVO and to call if any respiratory distress and/or swelling occurs. Pt transitioned to PO lasix and aldactone for diuresing. Adequate UO, no retention since removal of foley 7/13.  Will continue to monitor.    Lab Results   Component Value Date    NA 137 07/27/2016    K 3.5 07/27/2016    CL 102 07/27/2016    BICARB 25 07/27/2016    BUN 72 (H) 07/27/2016    CREAT 1.07 (H) 07/27/2016    GLU 75 07/27/2016    [place] 9.8 07/27/2016     Lab Results   Component Value Date    BNP  53 10/09/2008    BNPP 10697 (H) 07/21/2016       Yesterday's intake and output:  07/14 0600 - 07/15 0559  In: 645.1 [P.O.:600; I.V.:45.1]  Out: 1775 [Urine:1775]    Weights (last 14 days)     Date/Time Weight Weight Source Percentage Weight Change (%) Who    07/26/16 0022 64.4 kg (141 lb 15.6 oz) Standing scale 0.78 % SB    07/24/16 0526 63.9 kg (140 lb 14 oz) Standing scale 0.31 % SB    07/23/16 0609 63.7 kg (140 lb 6.9 oz) Standing scale 0.95 % AG    07/22/16 0400 63.1 kg (139 lb 1.8 oz) Standing scale 0.48 % RS    07/21/16 0546 62.8 kg (138 lb 7.2 oz) Standing scale 0 % LC    07/20/16 0525 62.8 kg (138 lb 7.2 oz) Standing scale 0.96 % LC    07/19/16 0451 62.2 kg (137 lb 2 oz) Standing scale -0.32 % TC    07/18/16 0527 62.4 kg (137 lb 9.1 oz) Standing scale -0.95 % MM     07/17/16 0327 63 kg (138 lb 14.2 oz) Standing scale 0.64 % RS    07/16/16 0248 62.6 kg (138 lb 0.1 oz) Standing scale -3.49 % KM    07/14/16 2242 64.9 kg (143 lb) Standing scale 0 % BS              Problem: Coping - Ineffective, Patient and Family  Goal: Effective or improved coping  Outcome: Goal Met

## 2016-07-27 NOTE — Plan of Care (Signed)
Problem: Bleeding, Risk of  Goal: Absence of active bleeding  Outcome: Goal Met  No outward ss of bleeding.    Results for Diana Chambers, Diana Chambers (MRN [id number]) as of 07/27/2016 03:20   Ref. Range 07/26/2016 05:55   WBC Latest Ref Range: 4.0 - 10.0 1000/mm3 5.2   RBC Latest Ref Range: 3.90 - 5.20 mill/mm3 3.85 (L)   Hgb Latest Ref Range: 11.2 - 15.7 gm/dL 9.8 (L)   Hct Latest Ref Range: 34.0 - 45.0 % 31.0 (L)   MCV Latest Ref Range: 79.0 - 95.0 um3 80.5   MCH Latest Ref Range: 26.0 - 32.0 pgm 25.5 (L)   MCHC Latest Ref Range: 32.0 - 36.0 g/dL 31.6 (L)   RDW Latest Ref Range: 12.0 - 14.0 % 18.4 (H)   Plt Count Latest Ref Range: 140 - 370 1000/mm3 24 (L)   NRBC Latest Ref Range: <1 /100 WBC 1       Problem: Tissue Perfusion, Cardiopulmonary - Altered  Goal: Hemodynamic stability  Outcome: Goal Met  VSS, Pt A&0, pulses palpable, denies CP, SOB, lightheadedness, and dizziness. Pt remains on Flolan, rate/dose verified, extra CADD pump in room. NSR on telemetry with frequent dropped beats - MD notified, checking electrolytes. Will continue to monitor and assess.    Problem: Fluid Volume Excess  Goal: Absence of edema; peripheral and/or pulmonary  Outcome: Unable to meet goal at this time.  Pt continues with edema to LE bilaterally and remains on Lasix (see MAR) with good UOP. VSS.  Goal: Vital signs within specified parameters  Outcome: Goal Met      Problem: Coping - Ineffective, Patient and Family  Goal: Effective or improved coping  Outcome: Goal Met  Pt and her husband calm and cooperative this shift.

## 2016-07-28 ENCOUNTER — Other Ambulatory Visit (INDEPENDENT_AMBULATORY_CARE_PROVIDER_SITE_OTHER): Payer: Self-pay | Admitting: Internal Medicine

## 2016-07-28 ENCOUNTER — Encounter (HOSPITAL_COMMUNITY): Payer: Self-pay

## 2016-07-28 DIAGNOSIS — I509 Heart failure, unspecified: Secondary | ICD-10-CM

## 2016-07-28 DIAGNOSIS — D696 Thrombocytopenia, unspecified: Principal | ICD-10-CM

## 2016-07-28 DIAGNOSIS — D7589 Other specified diseases of blood and blood-forming organs: Secondary | ICD-10-CM

## 2016-07-28 LAB — HEMOGRAM, BLOOD
Hct: 32.3 % — ABNORMAL LOW (ref 34.0–45.0)
Hgb: 10.1 gm/dL — ABNORMAL LOW (ref 11.2–15.7)
MCH: 25.4 pg — ABNORMAL LOW (ref 26.0–32.0)
MCHC: 31.3 g/dL — ABNORMAL LOW (ref 32.0–36.0)
MCV: 81.4 um3 (ref 79.0–95.0)
NRBC: 1 /100 WBC (ref <1)
Plt Count: 31 10*3/uL — ABNORMAL LOW (ref 140–370)
RBC: 3.97 10*6/uL (ref 3.90–5.20)
RDW: 18.6 % — ABNORMAL HIGH (ref 12.0–14.0)
WBC: 5.4 10*3/uL (ref 4.0–10.0)

## 2016-07-28 LAB — BASIC METABOLIC PANEL, BLOOD
Anion Gap: 10 mmol/L (ref 7–15)
BUN: 64 mg/dL — ABNORMAL HIGH (ref 8–23)
Bicarbonate: 25 mmol/L (ref 22–29)
Calcium: 9.7 mg/dL (ref 8.5–10.6)
Chloride: 104 mmol/L (ref 98–107)
Creatinine: 1.1 mg/dL — ABNORMAL HIGH (ref 0.51–0.95)
GFR: 50 mL/min
Glucose: 74 mg/dL (ref 70–99)
Potassium: 3.6 mmol/L (ref 3.5–5.1)
Sodium: 139 mmol/L (ref 136–145)

## 2016-07-28 LAB — GLUCOSE (POCT): Glucose (POCT): 92 mg/dL (ref 70–99)

## 2016-07-28 LAB — MAGNESIUM, BLOOD: Magnesium: 2.3 mg/dL (ref 1.6–2.4)

## 2016-07-28 MED ORDER — SPIRONOLACTONE 50 MG OR TABS
ORAL_TABLET | ORAL | 0 refills | Status: DC
Start: 2016-07-28 — End: 2017-03-09

## 2016-07-28 MED ORDER — PREDNISONE 20 MG OR TABS
30.0000 mg | ORAL_TABLET | Freq: Every day | ORAL | Status: DC
Start: 2016-07-29 — End: 2016-07-28

## 2016-07-28 MED ORDER — DIGOXIN 0.125 MG OR TABS
125.0000 ug | ORAL_TABLET | Freq: Every evening | ORAL | 3 refills | Status: DC
Start: 2016-07-28 — End: 2017-07-15

## 2016-07-28 MED ORDER — SPIRONOLACTONE 50 MG OR TABS
75.0000 mg | ORAL_TABLET | Freq: Every day | ORAL | 0 refills | Status: DC
Start: 2016-07-28 — End: 2017-03-07

## 2016-07-28 MED ORDER — PREDNISONE 10 MG OR TABS
ORAL_TABLET | ORAL | 0 refills | Status: DC
Start: 2016-07-28 — End: 2017-03-09

## 2016-07-28 MED ORDER — PREDNISONE 10 MG OR TABS
30.0000 mg | ORAL_TABLET | Freq: Every day | ORAL | 0 refills | Status: DC
Start: 2016-07-28 — End: 2016-08-13

## 2016-07-28 MED ORDER — DIGOXIN 0.25 MG OR TABS
125.0000 ug | ORAL_TABLET | Freq: Every evening | ORAL | 3 refills | Status: DC
Start: 2016-07-28 — End: 2016-07-28

## 2016-07-28 MED ORDER — POTASSIUM CHLORIDE CRYS CR 10 MEQ OR TBCR
40.00 meq | EXTENDED_RELEASE_TABLET | Freq: Once | ORAL | Status: AC
Start: 2016-07-28 — End: 2016-07-28
  Administered 2016-07-28: 40 meq via ORAL
  Filled 2016-07-28: qty 4

## 2016-07-28 MED ORDER — FUROSEMIDE 40 MG OR TABS
100.0000 mg | ORAL_TABLET | Freq: Three times a day (TID) | ORAL | 3 refills | Status: DC
Start: 2016-07-28 — End: 2017-08-27

## 2016-07-28 MED FILL — PREDNISONE TAB 10 MG: MG | 17 days supply | Qty: 41 | Fill #0

## 2016-07-28 MED FILL — FUROSEMIDE TAB 40 MG: MG | 27 days supply | Qty: 200 | Fill #0

## 2016-07-28 MED FILL — DIGOXIN TAB 0.125 MG: MG | 30 days supply | Qty: 30 | Fill #0

## 2016-07-28 MED FILL — SPIRONOLACTONE TAB 50 MG: MG* | 30 days supply | Qty: 45 | Fill #0

## 2016-07-28 NOTE — Plan of Care (Signed)
Problem: Bleeding, Risk of  Goal: Absence of active bleeding  Outcome: Progressing toward goal, anticipate improvement over: next 12-24 hours  No active signs of bleeding noted at this time. Instructed pt to notify for any signs of bleeding. Call light within reach. Will continue to monitor.     Problem: Tissue Perfusion, Cardiopulmonary - Altered  Goal: Hemodynamic stability  Outcome: Progressing toward goal, anticipate improvement over: next 12-24 hours  Pt stable at this time. No c/o cardiac issues. SR AVB on tele. Amb around unit. Will continue to monitor. Instructed to notify for any cardiac issues.     Problem: Fluid Volume Excess  Goal: Absence of edema; peripheral and/or pulmonary  Outcome: Progressing toward goal, anticipate improvement over: next 12-24 hours  Pt with edema present. Strict I/Os. Meds given as ordered. Good UOP. Will continue to monitor.

## 2016-07-28 NOTE — Discharge Instructions (Signed)
Diagnosis and Reason for Admission    You were admitted to the hospital for the following reason(s):    Pulmonary hypertension  Volume overload   Low platelets    What Happened During [facility] Stay    We gave you IV Lasix to remove the extra fluid you had   Continued your same pulmonary hypertension medications     IMPORTANT MEDICATIONS when you go home:   1. Lasix 100 mg three times a day ---- this is a dose change!   2. Digoxin 125 mcg once a day ---- this is a dose change!  3. Prednisone 30 mg for 7 days, then reduce to 20 mg on 7/23. Continue taking 20 mg until you follow up with Dr. [name]   4. Potassium chloride 40 mEq once a day  5. Spironolactone 75 mg once a day ---- this is a dose change!   6. Flolan 35 ng/kg/min   7. Sildenafil 80 mg three times a day     STOP taking Uloric, Dapsone, Metolazone     Get labs on Friday     Follow up with Dr. [name] in ~2 weeks     You will also need to follow up with hematology about your platelets       Instructions for After Discharge:     Your diet at home should be a low-salt diet.    Your activity level at home should be:  as much exercise or activity as you can tolerate.    Specific activity restrictions:    None    Wound or tube care instructions:  None    Your medication list is located on this After Visit Summary in the Current Discharge Medication List section.  Your nurse will review this information with you before you leave the hospital.    It is very important for you to keep a current medication list with you in order to assist your doctors with your medical care.  Bring this After Visit Summary with you to your follow up appointments.    Reasons to Contact a Doctor Urgently    Call 911 or return to the hospital immediately if:  Chest pain, shortness of breath, loss of consciousness/ passing out, increasing fluid     You should contact either your primary care physician or your hospital physician for any of the following reasons: Nausea, vomiting,  diarrea    If you have any questions about your hospital care, your medications, or if you have new or concerning symptoms soon after going home from the hospital, and you need to contact your hospital physician, your hospital physician can be contacted in the following manner:  [facility] operator at [phone].    Once you are able to see your primary care physician (PCP), your PCP will then be responsible for further medication refills, or appointment referrals.    What Needs to Happen Next After Discharge -- Appointments and Follow Up    Any appointments already scheduled at [place] clinics will be listed in the Future Appointments section at the top of this After Visit Summary.  Any appointments that have been requested, but have not yet been scheduled, will be listed below that under Post Discharge Referrals.

## 2016-07-28 NOTE — Discharge Summary (Signed)
Patient Name:  Diana Chambers    Principal Diagnosis (required): Acute on chronic RV failure    Hospital Problem List (required):  Active Hospital Problems    Diagnosis    Thrombocytopenia (CMS-HCC) [D69.6]      Resolved Hospital Problems    Diagnosis   No resolved problems to display.       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  Patient underwent 5 day trial of high dose steroids with no response, making ITP still possible however unlikely.  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  Patient to f/u with hematology in outpatient setting to review remaining results of bone marrow biopsy and peripheral blood evaluation.  As long as patient without significant bleeding and with platelets stable, no need for further treatment at this time.    Recommendations:  - We will refer patient for outpatient hematology with Dr. Oretha Ellis (MPN specialist).  Ideally would coordinate as same day/adjacent day of Dr. Ozzie Hoyle clinic to make travel easier    Reason for Admission to the Hospital / History of Present Illness:  Diana Chambers is a 66 year old female with history of SLE and PAH 2/2 CTD and prior anorexigen use who presents today for nose bleeds and fatigue. Was recently hospitalized 05/27/16 to 06/21/16 for decompensated right heart failure. During that admission, received a right heart cath which showed severe PH and  was diuresed 39L, and discharged on lasix, metolazone, aldactone. Her Macitentan was discontinued during this admission due to anasarca and persistent anemia, and Flolan was uptitrated to 35 ng/kg/min. Additionally, she was started on prednisone due to a concern for rheumatological component to decompensation and markedly improved, so was discharged on a prednisone taper with Dapsone prophylaxis. Patient recently completed her prednisone taper this past week.    Called due to persistent nose bleeds and outside labs (scanned in) showed platelets 28, creatinine 1.98. She was instructed to go to the ED and drove to [place]. Denies fevers. States breathing is at her baseline. Hasn't noticed any excessive swelling and has been avoiding salt. Does have some nausea and diarrhea, and poor appetite. Not doing much activity due to dyspnea and fatigue.     Hospital Course by Problem (required):  # Acute on chronic RV failure  # WHO I pulmonary arterial hypertension - patient presented volume overloaded and was diuresed with Lasix 60 IV q6H and started on hydrocortisone 100 q6H given her past response to steroids. She required several doses of Diuril to adequately diurese, but ultimately responded well and diuresed a total of 15.5L during admission. She was transitioned to oral lasix 100 mg TID and continued to have adequate urine output. She was discharged with lasix 100 mg TID, spironolactone 75 mg daily, digoxin 125 mcg daily, and continued on her home PAH therapies without any dose change, of IV Flolan 35 ng/kg/min and sildenafil 80 mg TID. She will wean down prednisone by 10 mg qweek until she reaches 20 mg, and will continue on  20 mg until follow up in clinic with Dr. [name].     # Thrombocytopenia - noted to have platelets 18 on outside labs with epistaxis. On admission, found to have platelets 16 but without any bleeding. Initially thought was drug-induced, possibly related to febuxostat or dapsone, or ITP. However did not  have much of a response with holding possible offending agents and with high dose steroids. Heme/onc was consulted and performed a bone marrow biopsy, with preliminary results consistent with myelofibrosis. She will follow up with hematology (Dr. Oretha Ellis) for results of further stains and discuss options. On discharge, platelets were 31 and did not have any significant bleeding during admission. She will get labs on Friday for follow up.     # AKI - on presentation, had creatinine 1.94 which was thought most likely due to cardiorenal given overall volume overloaded. She was diuresed and creatinine improved, and was at baseline (cr 1.10) prior to discharge. She will get labs on Friday.     # Elevated digoxin level - initially found to have digoxin level of 4.4, likely related to AKI. Digoxin was temporarily held and then restarted when level had reduced and renal function improved. She was restarted on a lower dose, at 125 mcg daily which she should continue on discharge.     # Lupus/ Sjogren's syndrome - no evidence of flare up during admission. She was started on high dose steroids though, as above.     # Gout - no acute flare during admission. Febuxostat was discontinued though given concern for possible DITP.     Tests Outstanding at Discharge Requiring Follow Up:  Stains of bone marrow biopsy    Discharge Condition (required):  Improved.    Key Physical Exam Findings at Discharge:  Physical examination is significant for:  .Marland Kitchen  CV: RRR, 3/6 systolic murmur  Pulm: CTAB  Ext: 1+ pitting edema bilateral LEs  Skin: diffuse flushing on trunk, scattered macular rash on LEs    Core Measures for Heart Failure:  The patient was not required to have her left ventricular ejection fraction measured, and is not required to have a prescription for an ACE inhibitor, angiotensin receptor blocker, or beta blocker, as her heart failure syndrome is that of right-sided heart failure due to pulmonary hypertension.    Discharge Diet:   Low-salt.    Discharge Medications:     What To Do With Your Medications      CHANGE how you take these medications       Add'l Info    digoxin 0.125 MG tablet   Commonly known as:  LANOXIN   Take 1 tablet (125 mcg) by mouth every evening.    Quantity:  30 tablet   Refills:  3   What changed:    - medication strength  - how much to take       furosemide 40 MG tablet   Commonly known as:  LASIX   Take 2.5 tablets (100 mg) by mouth 3 times daily.    Quantity:  200 tablet   Refills:  3   What changed:  how much to take       predniSONE 10 MG tablet   Commonly known as:  DELTASONE   Take 3 tablets (30 mg) by mouth daily. Take 31m (3 tablets) on 7/17 for 7 days. Then starting 7/23 take 234mdaily (2 tablets) until you follow up with Dr. PoBettey Mare  Quantity:  41 tablet   Refills:  0  What changed:    - how much to take  - additional instructions       spironolactone 50 MG tablet   Commonly known as:  ALDACTONE   Take 1.5 tablets (75 mg) by mouth daily.    Quantity:  30 tablet   Refills:  0   What changed:  how much to take         CONTINUE taking these medications       Add'l Info    azelastine 0.1 % nasal spray   Commonly known as:  ASTELIN   Spray 1 spray into each nostril 2 times daily. Use in each nostril as directed    Quantity:  1 bottle   Refills:  3       cyanocobalamin 500 MCG tablet   Take 1 tablet (500 mcg) by mouth daily.    Quantity:  30 tablet   Refills:  3       DIVIGEL 0.25 MG/0.25GM Gel   Apply 0.25 mg topically nightly.   Generic drug:  Estradiol    Refills:  0       estradiol 2 MG vaginal ring   Commonly known as:  ESTRING   Insert 1 Ring vaginally Every 3 months. Follow package directions    Refills:  0       ferrous sulfate 324 (65 Fe) MG Tbec   Take 1 tablet (324 mg) by mouth daily.    Quantity:  30 tablet   Refills:  0       FLOLAN injection   29ng/kg/min   Generic drug:  epoprostenol    Refills:  0       folic acid 517 MCG tablet   Commonly known as:  FOLVITE   1 TABLET DAILY    Refills:  0        medroxyPROGESTERone 2.5 MG tablet   Commonly known as:  PROVERA   Take 1.25 mg by mouth daily.    Refills:  0       potassium chloride 10 MEQ tablet   Commonly known as:  KLOR-CON   Take 4 tablets (40 mEq) by mouth daily.    Quantity:  120 tablet   Refills:  0       sildenafil 20 MG tablet   Commonly known as:  REVATIO   Take 4 tablets (80 mg) by mouth 3 times daily.    Quantity:  360 tablet   Refills:  0       vitamin D3 2000 units capsule   Take 1 capsule by mouth daily.    Refills:  0         STOP taking these medications          dapsone 100 MG tablet       febuxostat 40 MG Tabs   Commonly known as:  ULORIC       metolazone 5 MG tablet   Commonly known as:  ZAROXOLYN            Where to Get Your Medications      These medications were sent to [facility]  [address], [place] [address]    Hours:  Mon-Fri 8:30am-7:00pm, Sat-Sun 9am-5:00pm Phone:  [phone]     digoxin 0.125 MG tablet    furosemide 40 MG tablet    predniSONE 10 MG tablet    spironolactone 50 MG tablet  Allergies:  Allergies   Allergen Reactions    Allopurinol Hives    Levaquin Swelling and Other     Severe joint pain      Ofloxacin Nausea and Vomiting    Chlorhexidine Itching    Sulfa Drugs Unspecified       Discharge Disposition:  Home.    Discharge Code Status:  Do Not Attempt Resuscitation / full care  This is a change in the patient's code status compared to admission.  The reason for the change in code status is as follows:  goals of care - advanced care planning note on 07/15/16    Follow Up Appointments:    Scheduled appointments:  No future appointments.   F/u with Dr. [name] in 2 weeks  Referral to hematology, Dr. Oretha Ellis in 2 weeks    For appointments requested for after discharge that have not yet been scheduled, refer to the Post Discharge Referrals section of the After Visit Summary.    Discharging 22 Contact Information:  [facility] operator at  [phone].

## 2016-07-28 NOTE — Plan of Care (Addendum)
Problem: Bleeding, Risk of  Goal: Absence of active bleeding  Outcome: Goal Met  No bleeding at this time. Discussed with patient to watch bleeding and bruising and call MD for any bleeding noted.     Problem: Tissue Perfusion, Cardiopulmonary - Altered  Goal: Hemodynamic stability  Outcome: Goal Met  Vitals stable. Discussed with patient and husband to call MD if feeling short of breath, fluid overloaded, or any dizziness. Call 9-11 for syncope, chest pain or shortness of breath    Problem: Fluid Volume Excess  Goal: Absence of edema; peripheral and/or pulmonary  Outcome: Goal Met  Discussed signs and symptoms of fluid overload with patient and husband. Discussed diuretics and low sodium diet.     Problem: Coping - Ineffective, Patient and Family  Goal: Effective or improved coping  Outcome: Goal Met  Patient is in good spirits because she is going home    Discharge instructions reviewed with patient and family. Send home with prescription medications and patients own medication. Lab draw orders sent home with patient. Flolan cassette changed before discharge. Extra medication cassette sent with patient with multiple ice packs. Discharged to home with husband with belongings.

## 2016-07-30 ENCOUNTER — Telehealth (HOSPITAL_BASED_OUTPATIENT_CLINIC_OR_DEPARTMENT_OTHER): Payer: Self-pay

## 2016-07-30 LAB — LIMITED LYMPHOMA FLOW CYTOMETRY PANEL

## 2016-07-30 NOTE — Telephone Encounter (Signed)
Transitional Telephonic Nurse (TTN)  Patient unreached via post discharge calls.    TTN unable to reach the patient using phone numbers on file:   [phone] (Mobile)-Line was ringing, then busy tone, voicemail not accessible.   [phone] (Home)- Line was ringing, then busy tone, voicemail not accessible.      MJ Shanon Brow, MSN, PCCN, RN-BC  [place]  Transitional Telephonic Nursing  T: [phone]  [email or web address]

## 2016-07-31 ENCOUNTER — Telehealth (HOSPITAL_BASED_OUTPATIENT_CLINIC_OR_DEPARTMENT_OTHER): Payer: Self-pay

## 2016-07-31 NOTE — Telephone Encounter (Addendum)
Internal a STAT referral from ED Doctors to Hematology-Oncology.     Dx or reason for referral:    73F thrombocytopenia, BM bx while inpatient w/ MPN, possibly myelofibrosis. needs f/u with Dr. Oretha Ellis    Per inpatient consult note, Dr. Cruzita Lederer Drygalski's recommendations:  We explained that this will follow as outpatient with the expert in this field, Dr. Oretha Ellis. Patient understanding and thankful for the time we spent with them to explain the situation.There is no super acute urgency for treatment and action. The should f/u with Heme (Dr.J) within the next 2-3 weeks.    Routing referral to Dr. Franz Dell office to review and follow up with patient.

## 2016-08-01 LAB — LAB MISC TEST

## 2016-08-05 ENCOUNTER — Telehealth (HOSPITAL_BASED_OUTPATIENT_CLINIC_OR_DEPARTMENT_OTHER): Payer: Self-pay | Admitting: Hematology & Oncology

## 2016-08-05 LAB — LAB MISC TEST

## 2016-08-05 NOTE — Telephone Encounter (Signed)
Called pt to offer an appt with Dr. Baldomero Lamy on 08/01 however pt's husband refused to schedule appt with her since pt was referred directly to Dr. Oretha Ellis. Pt will not schedule with another provider other than Dr. Oretha Ellis. Per husband,    "If she doesn't see Dr. Oretha Ellis on the first oh well she will not see anybody.

## 2016-08-05 NOTE — Telephone Encounter (Signed)
Hematology Note    Spoke to Diana Chambers and let her know that Dr. Oretha Ellis and I both specialize in treating patients with myeloproliferative neoplasms.  Due to her full schedule on 08/13/16, Dr. Oretha Ellis asked that I evaluate the patient.      Due to 400-mile drive from [place], the patient prefers to be seen on 08/13/16 at 2:00, after her Pulmonary appointment.  She would like to discuss this with her husband, and then will call our scheduling team to confirm.  Contact information shared.    *Of note, patient's husband first picked up the phone and stated that the patient "was not right in the head" and that he made all decisions. After letting him know that I must speak to the patient directly due to patient privacy laws, he passed the phone to her.  From our conversation, the patient appeared competent and engaged in her medical care.

## 2016-08-08 ENCOUNTER — Telehealth (HOSPITAL_BASED_OUTPATIENT_CLINIC_OR_DEPARTMENT_OTHER): Payer: Self-pay

## 2016-08-08 NOTE — Telephone Encounter (Signed)
Awaiting for Dr. Baldomero Lamy to confirmed if she is still available on 8/01 before we can contact Mr. Linck.       Sauls, Renita    08/08/16 10:56 AM   Note      James-New patient's husband is calling to get her scheduled with Dr Baldomero Lamy.  Please refer to previous encounter

## 2016-08-08 NOTE — Telephone Encounter (Signed)
Returning patient's call, talked to patient and she stated that she talked it ou with her husband and they are okay seeing Dr. Baldomero Lamy on 8/01 at 2:00 PM.     Appointment has been confirmed.

## 2016-08-08 NOTE — Telephone Encounter (Signed)
James-New patient's husband is calling to get her scheduled with Dr Baldomero Lamy.  Please refer to previous encounter

## 2016-08-08 NOTE — Telephone Encounter (Signed)
Closing this encounter - Please see previous encounter for further info.

## 2016-08-11 ENCOUNTER — Telehealth (HOSPITAL_BASED_OUTPATIENT_CLINIC_OR_DEPARTMENT_OTHER): Payer: Self-pay

## 2016-08-12 LAB — NGS HEMATOLOGIC MALIGNANCY PANEL, BLOOD

## 2016-08-13 ENCOUNTER — Ambulatory Visit (HOSPITAL_BASED_OUTPATIENT_CLINIC_OR_DEPARTMENT_OTHER): Payer: Medicare Other | Admitting: Hematology & Oncology

## 2016-08-13 ENCOUNTER — Other Ambulatory Visit (INDEPENDENT_AMBULATORY_CARE_PROVIDER_SITE_OTHER): Payer: Medicare Other | Attending: Pulmonary Medicine

## 2016-08-13 ENCOUNTER — Encounter (HOSPITAL_BASED_OUTPATIENT_CLINIC_OR_DEPARTMENT_OTHER): Payer: Self-pay | Admitting: Hematology & Oncology

## 2016-08-13 ENCOUNTER — Encounter (HOSPITAL_COMMUNITY): Payer: Self-pay | Admitting: Pulmonary Medicine

## 2016-08-13 ENCOUNTER — Ambulatory Visit: Payer: Medicare Other | Attending: Pulmonary Medicine | Admitting: Pulmonary Medicine

## 2016-08-13 VITALS — BP 133/79 | HR 86 | Temp 98.0°F | Resp 16 | Ht 62.0 in | Wt 139.3 lb

## 2016-08-13 DIAGNOSIS — D696 Thrombocytopenia, unspecified: Principal | ICD-10-CM | POA: Insufficient documentation

## 2016-08-13 DIAGNOSIS — M359 Systemic involvement of connective tissue, unspecified: Secondary | ICD-10-CM

## 2016-08-13 DIAGNOSIS — I2721 Secondary pulmonary arterial hypertension: Secondary | ICD-10-CM | POA: Insufficient documentation

## 2016-08-13 DIAGNOSIS — T50905A Adverse effect of unspecified drugs, medicaments and biological substances, initial encounter: Secondary | ICD-10-CM | POA: Insufficient documentation

## 2016-08-13 DIAGNOSIS — K766 Portal hypertension: Secondary | ICD-10-CM | POA: Insufficient documentation

## 2016-08-13 DIAGNOSIS — I272 Pulmonary hypertension, unspecified: Secondary | ICD-10-CM | POA: Insufficient documentation

## 2016-08-13 DIAGNOSIS — D471 Chronic myeloproliferative disease: Secondary | ICD-10-CM | POA: Insufficient documentation

## 2016-08-13 DIAGNOSIS — E790 Hyperuricemia without signs of inflammatory arthritis and tophaceous disease: Secondary | ICD-10-CM | POA: Insufficient documentation

## 2016-08-13 DIAGNOSIS — I50812 Chronic right heart failure: Secondary | ICD-10-CM

## 2016-08-13 DIAGNOSIS — E876 Hypokalemia: Secondary | ICD-10-CM

## 2016-08-13 DIAGNOSIS — I509 Heart failure, unspecified: Secondary | ICD-10-CM | POA: Insufficient documentation

## 2016-08-13 LAB — COMPREHENSIVE METABOLIC PANEL, BLOOD
ALT (SGPT): 20 U/L (ref 0–33)
AST (SGOT): 17 U/L (ref 0–32)
Albumin: 3.8 g/dL (ref 3.5–5.2)
Alkaline Phos: 127 U/L (ref 35–140)
Anion Gap: 13 mmol/L (ref 7–15)
BUN: 44 mg/dL — ABNORMAL HIGH (ref 8–23)
Bicarbonate: 24 mmol/L (ref 22–29)
Bilirubin, Tot: 1.07 mg/dL (ref <1.2)
Calcium: 10.2 mg/dL (ref 8.5–10.6)
Chloride: 101 mmol/L (ref 98–107)
Creatinine: 1.39 mg/dL — ABNORMAL HIGH (ref 0.51–0.95)
GFR: 38 mL/min
Glucose: 98 mg/dL (ref 70–99)
Potassium: 4.3 mmol/L (ref 3.5–5.1)
Sodium: 138 mmol/L (ref 136–145)
Total Protein: 7.2 g/dL (ref 6.0–8.0)

## 2016-08-13 LAB — PRO BNP, BLOOD: BNPP: 13224 pg/mL — ABNORMAL HIGH (ref 0–899)

## 2016-08-13 LAB — CBC WITH DIFF, BLOOD
ANC-Manual Mode: 3.8 10*3/uL (ref 1.6–7.0)
Abs Lymphs: 1.8 10*3/uL (ref 0.8–3.1)
Abs Monos: 0.2 10*3/uL (ref 0.2–0.8)
Hct: 31 % — ABNORMAL LOW (ref 34.0–45.0)
Hgb: 10.1 gm/dL — ABNORMAL LOW (ref 11.2–15.7)
Lymphocytes: 31 %
MCH: 28 pg (ref 26.0–32.0)
MCHC: 32.6 g/dL (ref 32.0–36.0)
MCV: 85.9 um3 (ref 79.0–95.0)
MPV: 10.6 fL (ref 9.4–12.4)
Monocytes: 4 %
NRBC: 1 /100 WBC (ref <1)
Plt Count: 36 10*3/uL — ABNORMAL LOW (ref 140–370)
RBC: 3.61 10*6/uL — ABNORMAL LOW (ref 3.90–5.20)
RDW: 20.7 % — ABNORMAL HIGH (ref 12.0–14.0)
Segs: 65 %
WBC: 5.8 10*3/uL (ref 4.0–10.0)

## 2016-08-13 LAB — MDIFF
Number of Cells Counted: 112
Plt Est: DECREASED
RBC Comment: NORMAL

## 2016-08-13 MED ORDER — PREDNISONE 5 MG OR TABS
5.0000 mg | ORAL_TABLET | Freq: Every day | ORAL | 0 refills | Status: DC
Start: 2016-08-13 — End: 2017-04-01

## 2016-08-13 MED ORDER — PREDNISONE 1 MG OR TABS
2.0000 mg | ORAL_TABLET | Freq: Every day | ORAL | 0 refills | Status: DC
Start: 2016-08-13 — End: 2017-04-01

## 2016-08-13 NOTE — Patient Instructions (Addendum)
PATIENT INSTRUCTIONS:    Please have labs drawn weekly and faxed to me [phone])  Follow up with Dr. Baldomero Lamy in 62 month                                                                  [facility] Phone List for Patients     AFTER HOURS EMERGENCY NUMBER    [phone]     **Ask for the Hematology/Oncology Physician On-Call.                                                                                Physician: Craig Staggers, MD    Administrative Assistant: Eda Keys    Phone:    [phone]    Nurse Case Manager:  Ernesto Rutherford, RN     Phone:    [phone] Fax: [phone]      [facility] scheduling:   [phone]  Please call to confirm, schedule, cancel, or reschedule clinic appointments     [facility] and [place]:   [phone]     [place]:  [place]:  [phone]    Financial Counselors:  [phone] at [place]:       Phone:  [phone]   [place]:  [phone]     8:30am to 6:00pm    [facility] Lab:  [id number]       Interventional Radiology                                         [phone]   Medical Records     [id number]  Radiology ([place])   [phone]  Radiation Oncology                                                  [phone]  Registration (to update personal information) [phone]     [facility]                                                  [phone]  [address]   [place], [place] [phone]  Hours Mon-Fri 10 am - 6 pm  (Located in old [place] building next door to [employer] off of [address])    Questions about Billing and Insurance    Call us at [phone], Monday through Friday from 9 a.m. to 6 p.m.  For cancer support services, please visit out Patient and [facility].     For [facility] support services,  please visit:   [email or web address]     For other support from the [place], please visit: http://[email or web address]/SupportProgramsServices/index     www.cancer.org   ([place] [phone])  www.cancer.net   Print production planner of Clinical Oncologists approved information)  www.cancer.gov  ([place]  1-800-4-CANCER)  www.LLS.org    (Leukemia and Lymphoma Society  984-136-7537)       Follow-up care is a key part of your treatment and safety. Be sure to make and go to all appointments, and call us if you are having problems. Its also a good idea to know your test results and keep a list of the medicines you take and bring them to each appointment        Our Mission is to deliver outstanding patient care through commitment to the community, groundbreaking research and inspired teaching.     Our Vision is to create a healthier world ~ one life at a time ~ through new science, new medicine and new cures.     [facility]   [address] [place] [phone]

## 2016-08-13 NOTE — Progress Notes (Signed)
This office note has been dictated.

## 2016-08-13 NOTE — Progress Notes (Addendum)
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  She is accompanied by her husband, [name].    She reports fatigue but this has gradually improved since her last hospitalization.  She also reports improved leg swelling.  She continues to have extensive bruising over her chest and arms, but these are also gradually remitting.  She developed skin blistering over a few of her chest bruises after placing Tegaderm over the bruises.  The bruises over her left leg are nearly resolved.  Her appetite has improved a bit, and her weight is stabilized.    She has been hospitalized three times since 04/2016, each time due to volume overload from right heart failure requiring IV diuresis.  She was admitted 4/20-5/04 and Flolan was increased.  She was also started on prednisone for potential inflammatory/rheumatologic component to her decompensation, with subsequent improvement.  She was admitted 5/15-6/10 and transitioned to higher dose of lasix with metolazone, again with prednisone.  She was also started on febuxostat for hyperuricemia with possible gout, and started on dapsone 06/21/16 for PJP prophylaxis during this hospitalization.  She also underwent right heart cath which showed severe PH.  She was again admitted 7/2-7/16 for IV diuresis and also started on hydrocortisone 100 mg q6 hours.  Inpatient Heme consult was completed and bone marrow biopsy done (see below).  Febuxostat was discontinued in early July, and dapsone was also discontinued.    MPN History:  - Early 2011:  Thrombocytopenia first noted, platelet stable in  100-130 range  - 05/02/16-05/16/16:  Admitted for decompensated heart failure.  Platelet declined to 70-80 range.  - 05/27/16-06/22/16:  Admitted for decompensated heart failure.  Platelet decline to 45 on 06/20/16.  Macitentan discontinued.  New medications include dapsone and febuxostat.  RHC showed severe PH with PAP 92/37/62.  CT abd/pelv 06/07/16 showed 14 cm spleen, moderate hepatomegaly 22 cm.  - 07/14/16-07/28/16:  Admitted for decompensated heart failure.  Platelet declined to 18-20 range.  Bone marrow biopsy 07/21/16 showed a hypercellular marrow with trilineage hematopoiesis, 3% blasts by aspirate differential, focally mild reticulin fibrosis (mostly WHO grade 0 but focally grade 1), megakaryocytic hyperplasia/atypia/clustering.  Normal female karyotype and lymphoma FISH negative.  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  Dapsone and febuxostat discontinued.  - 08/13/16:  Platelet improve to 36 off of dapsone and febuxostat. MPN-SAF score 15 (early satiety 4, difficulty sleeping 3, night sweats 3, overall QOL 5).    ECOG Performance Status: 2    Past Medical and Surgical History:  Past Medical History:   Diagnosis Date    Pulmonary HTN     Sjoegren syndrome (CMS-HCC) 07/12/2009     No past surgical history on file.    Social History:  Social History     Social History    Marital status: Married     Spouse name: N/A    Number of children: N/A    Years of education: N/A     Social History Main Topics    Smoking status: Former Smoker     Packs/day:  0.50     Years: 5.00     Quit date: 01/13/1978    Smokeless tobacco: Never Used    Alcohol use No    Drug use: Not on file    Sexual activity: Not on file     Social Activities of Daily Living Present    Not on file     Social History Narrative       Family History:  family history includes Heart Disease in her father and mother; Hypertension in her mother; Psychiatry in her mother.    Current Medications:  Current Outpatient Prescriptions   Medication Sig     azelastine (ASTELIN) 0.1 % nasal spray Spray 1 spray into each nostril 2 times daily. Use in each nostril as directed    Cholecalciferol 2000 UNITS CAPS Take 1 capsule by mouth daily.    cyanocobalamin 500 MCG tablet Take 1 tablet (500 mcg) by mouth daily.    digoxin (LANOXIN) 0.125 MG tablet Take 1 tablet (125 mcg) by mouth every evening.    Estradiol (DIVIGEL) 0.25 MG/0.25GM GEL Apply 0.25 mg topically nightly.    estradiol (ESTRING) 2 MG vaginal ring Insert 1 Ring vaginally Every 3 months. Follow package directions    ferrous sulfate 324 (65 FE) MG TBEC Take 1 tablet (324 mg) by mouth daily.    FLOLAN 1.5 MG IV SOLR 44HQ/PR/FFM    FOLIC ACID 384 MCG OR TABS 1 TABLET DAILY    furosemide (LASIX) 40 MG tablet Take 2.5 tablets (100 mg) by mouth 3 times daily.    medroxyPROGESTERone (PROVERA) 2.5 MG tablet Take 1.25 mg by mouth daily.    potassium chloride (KLOR-CON) 10 MEQ tablet Take 4 tablets (40 mEq) by mouth daily.    predniSONE (DELTASONE) 1 MG tablet Take 2 tablets (2 mg) by mouth daily. 2 mg daily x 10 days.Then decrease to 1 mg daily for 10 days.Then discontinue.    predniSONE (DELTASONE) 5 MG tablet Take 1 tablet (5 mg) by mouth daily.    sildenafil (REVATIO) 20 MG tablet Take 4 tablets (80 mg) by mouth 3 times daily.    spironolactone (ALDACTONE) 50 MG tablet Take 1.5 tablets (75 mg) by mouth daily.     No current facility-administered medications for this visit.        Allergies:  Allopurinol; Levaquin; Ofloxacin; Chlorhexidine; and Sulfa drugs    Review of Systems:  GEN: The patient has no weight loss, fevers, or chills  EYES:No change in vision.  ENT: No change in hearing. No epistaxis.   PULM: No dyspnea, productive cough, or wheezing  CARDIO:  No chest pain, tachycardias, dyspnea on exertion.  GI: No jaundice, hematemesis, abdominal pain, diarrhea or constipation.  GU: No dysuria or hematuria.  ENDO: No diabetes.  JOINT:  No new joint pains.  HEME/LYMPHATIC: No bleeding, anemias, or  lymphadenopathy.  NEURO: No loss of consciousness, headaches.    Physical Examination:  BP 133/79 (BP Location: Left arm, BP Patient Position: Sitting, BP cuff size: Regular)   Pulse 86   Temp 98 F (36.7 C) (Oral)   Resp 16   Ht 5' 2"  (1.575 m)   Wt 63.2 kg (139 lb 5.3 oz)   SpO2 96%   BMI 25.48 kg/m2  GENERAL APPEARANCE: The patient is an alert, cooperative female in no acute distress.  SKIN: Scattered ecchymoses over anterior chest, dorsal forearms, left anterolateral thigh; No lesions or rashes.  EYES: PERRLA, EOMs are intact. No icterus.   EAR, NOSE, AND THROAT:  The oropharynx is clear.   LYMPH NODES: There is no cervical, axillary, or inguinal adenopathy.  CHEST: The lungs are clear to auscultation.  HEART: Regular rate and rhythm.   ABDOMEN: The abdomen is soft and nontender. There is no hepatosplenomegaly.  EXTREMITES: There is 1+ pitting edema bilaterally to the mid-shin. Joints are normal without redness or swelling.  NEURO: Mental status is normal. No focal neurologic findings.    Today's Labs Reviewed:  Lab Results   Component Value Date    BUN 44 (H) 08/13/2016    CREAT 1.39 (H) 08/13/2016    CL 101 08/13/2016    NA 138 08/13/2016    K 4.3 08/13/2016    [place] 10.2 08/13/2016    BICARB 24 08/13/2016    GLU 98 08/13/2016     Lab Results   Component Value Date    AST 17 08/13/2016    ALT 20 08/13/2016    LDH 236 (H) 07/18/2016    ALK 127 08/13/2016    TP 7.2 08/13/2016    ALB 3.8 08/13/2016    TBILI 1.07 08/13/2016    DBILI <0.2 07/18/2016     Lab Results   Component Value Date    WBC 5.8 08/13/2016    RBC 3.61 (L) 08/13/2016    HGB 10.1 (L) 08/13/2016    HCT 31.0 (L) 08/13/2016    MCV 85.9 08/13/2016    MCHC 32.6 08/13/2016    RDW 20.7 (H) 08/13/2016    PLT 36 (L) 08/13/2016    PLT 179 09/12/2008    MPV 10.6 08/13/2016    LYMPHS 31 08/13/2016    MONOS 4 08/13/2016    EOS 2 06/20/2016    BASOS 1 07/15/2016     Today's Cellavision Smear Reviewed:  Decreased platelet count (4-8 per 10.7X HPF) with decreased  granulation.  RBCs with varying hemoglobinization and teardrop forms, elliptocytes.  WBC morphology notable for rare immature granulocytes (promyelocyte, blast).            Assessment/Plan:  66 year old year old female referred for myeloproliferative neoplasm.    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  New medications during this time included dapsone and febuxostat.  Although neither of these drugs are commonly associated with DITP, agree with holding non-critical medications.  It is encouraging that her platelet count has improved slightly since holding dapsone and febuxostat.  Discussed that her severe thrombocytopenia appears out of proportion to the degree of myeloid neoplasm seen in her bone marrow.  The patient prefers to continue observing her blood counts and symptoms closely, which is a reasonable plan.  We may consider initiating treatment if blood counts do not improve over the next month.  Treatment options include hypomethylating agent therapy vs thalidomide/prednisone.  Less inclined to use JAK2 inhibitors due to platelet count <50; potential for benefit also appears low at this time, as patients with myelofibrosis treated with JAK2 inhibitors experienced improvement in constitutional symptoms and/or splenomegaly (both are mild for her).   - Weekly CBC/diff, CMP (will  complete in [place] and fax to [place], lab order form given to patient)  - Follow up DITP testing  - Return to Heme in 1  month    2) Pulmonary hypertension:  Severe by recent RHC.  On chronic home oxygen, diuretics and PAH targeted therapies.  - Continue PAH therapies with Dr. [name] (Flolan, Revatio, Lasix, spironolactone)  - Prednisone gradual taper    3) SLE/other connective tissue disease:  Diagnosis unclear, with positive serologies but no clear clinical findings per Rheumatology.  Comprehensive scleroderma panel sent.  - Follow up with Rheumatology    4) Hyperuricemia:  Fortunately no flare in joint symptoms since holding febuxostat.  Febuxostat held, possibly had worsened thrombocytopenia.  - Agree with holding febuxostat    5) Difficult spouse:  The patient's husband yelled at times during the interview.  He was difficult to communicate with, at times asking multiple questions consecutively without giving time to provide answers.  Will consider additional intervention (Risk Management) if his behavior continues.    Craig Staggers, MD    CC:  Larna Daughters.   [place], [place]

## 2016-08-13 NOTE — Progress Notes (Signed)
CLINIC:      REPORT TYPE:  NOTE    Dictating Practioner:  Camelia Eng. [name], MD    Staff Physician:  Camelia Eng. [name], MD    DATE OF SERVICE:  08/13/2016    REASON FOR VISIT:      CHIEF COMPLAINT:  Followup for pulmonary vascular disease.    HISTORY OF PRESENT ILLNESS:  Diana Chambers is a 66 year old  female with history of WHO group 1 pulmonary arterial hypertension  and associated presence of connective tissue disease who returns to  clinic for followup.  She was last seen during hospitalization, at  which time she was admitted for acute on chronic right heart failure,  volume overload, acute kidney injury, and new thrombocytopenia.  Thankfully on followup discharge with her oral diuretic regimen, she  has been able to maintain euvolemia and describes that her symptoms  of shortness of breath are better than they have been in almost a  year.  She reports that she has been able to maintain her weight  without additional dose of metolazone, and is currently just using  Lasix 100 mg p.o. 3 times daily.  Additionally, she has been followed  up for her thrombocytopenia, which we believe may be related to 2 new  medications that she was treated with in the recent past, including  Uloric and dapsone.  Her followup platelet counts on the outside have  been very slowly increasing, still though in the 20,000 to 30,000  range.  She has had some bleeding complications related to her  thrombocytopenia, including some soreness and blistering from her  tunneled catheter site bandage, as well as some healing bruises on  her forearms related to IVs placed during her hospitalization.  Additionally, she had a recent mechanical fall, and fell on her left  side, and she has a bruise down her left leg.  However, no  significant hematoma and no significant blood loss on any of her  followup CBCs.     She continues to use supplemental oxygen at 3 L/minute and at sea  level has been satting anywhere from 98% to 100%.     Additionally,  she has been on a prolonged steroid taper which was  started during her hospitalization for heart failure.  She is down to  10 mg of prednisone p.o. daily and has had no worsening of her  symptoms as she has titrated down.     CURRENT MEDICATIONS:    1.  Astelin nasal spray, 1 spray to each nostril 2 times daily.   2.  Cholecalciferol 2000 unit capsule, 1 capsule daily.   3.  Digoxin 0.125 mg p.o. daily.    4.  Estradiol gel 0.25 mg topically nightly.   5.  Iron sulfate 325 mg p.o. daily.   6.  Flolan intravenous 29 ng/kg per minute.   7.  Folic acid 782 mg p.o. daily.   8.  Lasix 100 mg p.o. 3 times daily.    9.  Medroxyprogesterone 1.25 mg p.o. daily.   10.  Potassium chloride 40 mEq p.o. daily.   11.  Prednisone currently taking 10 mg p.o. daily.  12.  Sildenafil 80 mg p.o. t.i.d.    13.  Spironolactone 75 mg p.o. daily.    PHYSICAL EXAMINATION:    VITAL SIGNS:  Blood pressure 144/70, pulse 84, respirations 13,  temperature 98, saturation 99% on 3 L nasal cannula.  Weight 139  pounds.   GENERAL:  Chronically ill appearing, with bruising  across her  forearms, chest, and legs.  In no acute distress.  Able to speak in  full sentences.   HEENT:  Oropharynx clear.   NECK:  Mild elevation in jugular venous distention.  No thyromegaly.  No lymphadenopathy.   HEART:  Regular rate and rhythm.  Loud 2nd heart sound.  Positive  systolic murmur.  No S3 noted.   LUNGS:  Faint rales at the bases bilaterally.     ABDOMEN:  Obese, soft, nondistended, nontender.  Significantly less  distended than on prior visits.   EXTREMITIES:  Bruising bilaterally.  Trace edema of bilateral lower  extremities.   CHEST WALL:  Significant areas of bruising related to blisters from  her central line dressing.     ASSESSMENT:  Diana Chambers is a 66 year old female with a  history of WHO group 1 pulmonary hypertension related to the presence  of connective tissue disease, as well as recent diagnosis of  thrombocytopenia, which is likely  medication induced.  Also, with a  history of very mild myelofibrosis, according to bone marrow biopsy,  who has stable New York Heart Association functional class II/III  symptoms on her current PAH targeted therapies and has managed to  maintain euvolemia on her current dose of diuretics.  Additionally,  her platelet count has slowly increased after withholding the  medications dapsone and Uloric.     PLAN:  We will continue her on her current PAH targeted therapies  including intravenous epoprostenol 29 ng/kg per minute and sildenafil  80 mg p.o. t.i.d.  We will also continue her current diuretic regimen  and potassium supplementation regimen.  We will plan to repeat her  basic metabolic panel in approximately 1 week for followup for  creatinine, which is mildly elevated on today's exam.  She is  scheduled to follow up with Hematology regarding her thrombocytopenia  today.  We will follow up with them after her visit to determine if  there are any additional recommendations.  In the meantime, we will  plan to see her back in clinic in approximately 1 month for followup.       Job #:  [id number]      DD:  08/13/2016  DT:  08/13/2016 18:45:40  DSP/MODL          [id number]    Referring Physician:  [name]

## 2016-08-13 NOTE — Interdisciplinary (Signed)
Diana Chambers is here for an initial evaluation with Dr. Craig Staggers for thrombocytopenia. Pt. is referred by Dr. [name].    Medication list and Allergies reviewed with patient and updated.  Vital signs assessed.    Assessment:      Healthy appearing 66 year old female in no acute distress.      Alert, oriented and cooperative  Ambulatory with steady gait    Pt is seen and examined by provider.  RN provided discharge/follow up instructions and teaching.            Wellbeing Assessment:  Patient is accompanied.                   Patient reports minimal anxiety related to diagnosis and/or treatment.  The patient's mood is appropriate for situation.    Pt denies cultural/social issues at this time. Support system in place.     Wellbeing screening given to and completed by patient.  Social Work and Product/process development scientist provided in AVS.      Special needs:  None   Barriers to learning:   None     Care Management Assessment:/Education:  Transportation, home environment, ability to identify and report untoward or adverse effects, ability to engage in self care and/or provide care, willingness to participate in and adhere to the treatment plan identified.    Patient verbalizes understanding of teaching and instructions.  Questions asked and answered.  AVS reviewed and given.    Plan of care/Patient Instructions:  Please have labs drawn weekly and faxed to me [phone])  Follow up with Dr. Baldomero Lamy in 1 month

## 2016-08-15 DIAGNOSIS — D471 Chronic myeloproliferative disease: Secondary | ICD-10-CM

## 2016-08-15 DIAGNOSIS — E790 Hyperuricemia without signs of inflammatory arthritis and tophaceous disease: Secondary | ICD-10-CM

## 2016-08-20 ENCOUNTER — Encounter (HOSPITAL_COMMUNITY): Payer: Self-pay

## 2016-08-20 DIAGNOSIS — I509 Heart failure, unspecified: Principal | ICD-10-CM

## 2016-08-20 LAB — JAK2 V617F MUTATION, QUALITATIVE ANALYSIS, BLOOD
JAK2 Interp Comments: NOT DETECTED
JAK2 V617F Mutation: NOT DETECTED

## 2016-08-21 ENCOUNTER — Telehealth (HOSPITAL_BASED_OUTPATIENT_CLINIC_OR_DEPARTMENT_OTHER): Payer: Self-pay

## 2016-08-21 LAB — CBC WITH DIFF, BLOOD
Hct: 27.8
Hgb: 9.4
MCH: 28.2
MCHC: 33.8
MCV: 84
Plt Count: 42
RBC: 3.33
RDW: 19.1
WBC: 4.1

## 2016-08-21 LAB — COMPREHENSIVE METABOLIC PANEL, BLOOD
ALT (SGPT): 15
AST (SGOT): 15
Albumin/Globulin Ratio: 1.4
Albumin: 3.9
Alkaline Phos: 142
BUN: 41
Bicarbonate: 22
Bilirubin, Total: 0.6
Calcium: 9.4
Chloride: 96
Creatinine: 1.29
Globulin: 2.7
Glucose: 111
Potassium: 4.9
Sodium: 132
Total Protein: 6.6

## 2016-08-21 NOTE — Telephone Encounter (Signed)
Labs rec'd from [facility].  Entered in [place] as external results.  Report is labelled and brought to side B MA for scanning

## 2016-08-21 NOTE — Telephone Encounter (Signed)
Called patient, let her know that we received outside lab results showing improved platelet count to 42.  Other results are stable.  We discussed that we would continue to monitor her blood counts weekly, no intervention needed at this time.

## 2016-08-22 ENCOUNTER — Encounter (HOSPITAL_COMMUNITY): Payer: Self-pay

## 2016-08-22 ENCOUNTER — Emergency Department
Admission: EM | Admit: 2016-08-22 | Discharge: 2016-08-23 | Disposition: A | Discharge status: Home / Self Care | Payer: Medicare Other | Attending: Emergency Medicine | Admitting: Emergency Medicine

## 2016-08-22 DIAGNOSIS — M79605 Pain in left leg: Secondary | ICD-10-CM

## 2016-08-22 DIAGNOSIS — M79662 Pain in left lower leg: Principal | ICD-10-CM | POA: Insufficient documentation

## 2016-08-22 DIAGNOSIS — M3500 Sicca syndrome, unspecified: Secondary | ICD-10-CM | POA: Insufficient documentation

## 2016-08-22 DIAGNOSIS — M7989 Other specified soft tissue disorders: Secondary | ICD-10-CM | POA: Insufficient documentation

## 2016-08-22 DIAGNOSIS — Z87891 Personal history of nicotine dependence: Secondary | ICD-10-CM | POA: Insufficient documentation

## 2016-08-22 DIAGNOSIS — I272 Pulmonary hypertension, unspecified: Secondary | ICD-10-CM | POA: Insufficient documentation

## 2016-08-22 NOTE — Progress Notes (Signed)
Prostacyclin (Flolan/Veletri/Remodulin)      Prostacyclin MCP 333.1      Prostacyclin: Flolan - IV    Prostacyclin Dosing Weight: 86 kg     Dose: 35 ng/kg/min    Concentration:  60,000 ng/mL    Rate: Flolan/Veletri/Remodulin IV CADD:  72 mL/24hr    Last Changed: 08/22/16 at 11:30AM  60.5 mLs remaining at 2345 on 08/22/16  Changes every 1 days    Has 2 pumps in date?: yes  1). Serial # [id number] Due Date: 12/26/16  2). Serial # [id number] Due Date: 05/26/17  Pump Company: [place]

## 2016-08-22 NOTE — ED Notes (Addendum)
Pulmonary HTN Notification    Nursing Supervisor [place] paged, Pulmonary HTN Sandi RN paged, Pharmacy [facility] notified, and Charge Nurse Autumn notified of pt arrival in ED.yes - [phone]     Does medication have enough ice/is it cold enough?yes   Is pump on run: yes   Reserve volume: 60.7  ml  Amount given: 36.68ml  The amount the pump delivers 64ml/24hrs:  Pharmacy has pt's dosing weight: 86kg.   Pharmacy has dosage: 35nanograms/kg  Prostacyclin was last changed at: 11:30am 08/22/16.

## 2016-08-22 NOTE — ED Provider Notes (Signed)
66 year old presents with left leg swelling. Patient states that 2 weeks ago she fell and struck her left leg.  She was not evaluated and continues to be able to walk on it with progressive swelling on the left side.  The leg hurts when she 1st gets up at night to walk on but the pain gradually diminish his throughout the day.  There is no specific area of the leg hurts more than any other part.  No more shortness of breath than usual, the patient is on Flolan for lung pathology.  No chest pain. No abdominal pain.    Past Medical History:   Diagnosis Date    Pulmonary HTN     Sjoegren syndrome (CMS-HCC) 07/12/2009     No past surgical history on file.  No current facility-administered medications for this encounter.      Current Outpatient Prescriptions   Medication Sig    azelastine (ASTELIN) 0.1 % nasal spray Spray 1 spray into each nostril 2 times daily. Use in each nostril as directed    Cholecalciferol 2000 UNITS CAPS Take 1 capsule by mouth daily.    cyanocobalamin 500 MCG tablet Take 1 tablet (500 mcg) by mouth daily.    digoxin (LANOXIN) 0.125 MG tablet Take 1 tablet (125 mcg) by mouth every evening.    Estradiol (DIVIGEL) 0.25 MG/0.25GM GEL Apply 0.25 mg topically nightly.    estradiol (ESTRING) 2 MG vaginal ring Insert 1 Ring vaginally Every 3 months. Follow package directions    ferrous sulfate 324 (65 FE) MG TBEC Take 1 tablet (324 mg) by mouth daily.    FLOLAN 1.5 MG IV SOLR 62VO/JJ/KKX    FOLIC ACID 381 MCG OR TABS 1 TABLET DAILY    furosemide (LASIX) 40 MG tablet Take 2.5 tablets (100 mg) by mouth 3 times daily.    medroxyPROGESTERone (PROVERA) 2.5 MG tablet Take 1.25 mg by mouth daily.    potassium chloride (KLOR-CON) 10 MEQ tablet Take 4 tablets (40 mEq) by mouth daily.    predniSONE (DELTASONE) 1 MG tablet Take 2 tablets (2 mg) by mouth daily. 2 mg daily x 10 days.Then decrease to 1 mg daily for 10 days.Then discontinue.    predniSONE (DELTASONE) 5 MG tablet Take 1 tablet (5 mg) by  mouth daily.    sildenafil (REVATIO) 20 MG tablet Take 4 tablets (80 mg) by mouth 3 times daily.    spironolactone (ALDACTONE) 50 MG tablet Take 1.5 tablets (75 mg) by mouth daily.     Social History   Substance Use Topics    Smoking status: Former Smoker     Packs/day: 0.50     Years: 5.00     Quit date: 01/13/1978    Smokeless tobacco: Never Used    Alcohol use No     Family History   Problem Relation Age of Onset    Heart Disease Mother     Hypertension Mother     Psychiatry Mother     Heart Disease Father        ROS- aside from above reviewed items, all systems reviewed and are negative.    Exam  BP 155/72   Pulse 94   Temp 98 F (36.7 C)   Resp 16   Ht 5\' 2"  (1.575 m)   Wt 66 kg (145 lb 8 oz)   SpO2 97%   BMI 26.61 kg/m2  heent - head nc/at.  eyes with clear sclera, eomi, perl, fundi nl bilat.  ears clear bilat.  mouth and throat without lesions, no erythema or exudates.  neck - supple, no nodes, no meningismus  lungs - clear bilat  card - rrr, no m, r, g  abdom - soft, nontender. bs present  extrem - erythematous rash throughout that patient states is always there because of Flolan, no lesions, no cyanosis.  bilat lower leg edema noted, l worse than r and worse in the distal part.  No overt signs of infection.  All rom intact, and pulses are strong bilaterally. Palpation of the left groin shows no palpable nodes with normal pulses.  neuro - nonfocal    Impression:  Left leg swelling greater than right  Plan  The patient states they would like to rule out blood clots in her lung and would like to hold on any laboratory testing at this time.     Deland Pretty, MD  08/22/16 [phone]

## 2016-08-22 NOTE — ED Notes (Signed)
Dr. Clark at bedside for evaluation.

## 2016-08-22 NOTE — ED Notes (Signed)
Dr. Erie Noe, pulm fellow notified of patient's arrival; [facility], house supervisor notified as well.

## 2016-08-22 NOTE — ED Notes (Addendum)
Pharmacist Mendel Ryder at bedside checking running pump and extra pump.

## 2016-08-23 ENCOUNTER — Emergency Department (HOSPITAL_BASED_OUTPATIENT_CLINIC_OR_DEPARTMENT_OTHER): Payer: Medicare Other

## 2016-08-23 ENCOUNTER — Emergency Department (EMERGENCY_DEPARTMENT_HOSPITAL): Payer: Medicare Other

## 2016-08-23 DIAGNOSIS — R600 Localized edema: Secondary | ICD-10-CM

## 2016-08-23 DIAGNOSIS — M7989 Other specified soft tissue disorders: Secondary | ICD-10-CM

## 2016-08-23 LAB — MDIFF
Immature Granulocytes Absolute Manual: 0.1 10*3/uL (ref 0.0–0.1)
Metamyelocytes: 1 %
Myelocytes: 1 %
Number of Cells Counted: 113
Plt Est: DECREASED

## 2016-08-23 LAB — COMPREHENSIVE METABOLIC PANEL, BLOOD
ALT (SGPT): 16 U/L (ref 0–33)
AST (SGOT): 16 U/L (ref 0–32)
Albumin: 3.8 g/dL (ref 3.5–5.2)
Alkaline Phos: 150 U/L — ABNORMAL HIGH (ref 35–140)
Anion Gap: 15 mmol/L (ref 7–15)
BUN: 49 mg/dL — ABNORMAL HIGH (ref 8–23)
Bicarbonate: 23 mmol/L (ref 22–29)
Bilirubin, Tot: 0.6 mg/dL (ref <1.2)
Calcium: 9.5 mg/dL (ref 8.5–10.6)
Chloride: 98 mmol/L (ref 98–107)
Creatinine: 1.29 mg/dL — ABNORMAL HIGH (ref 0.51–0.95)
GFR: 41 mL/min
Glucose: 80 mg/dL (ref 70–99)
Potassium: 4.2 mmol/L (ref 3.5–5.1)
Sodium: 136 mmol/L (ref 136–145)
Total Protein: 6.9 g/dL (ref 6.0–8.0)

## 2016-08-23 LAB — CKMB+INDEX (NO CPK), BLOOD
CK-MB: 2 ng/mL (ref 0.0–2.8)
CK-MB: 2 ng/mL (ref 0.0–2.8)

## 2016-08-23 LAB — CBC WITH DIFF, BLOOD
ANC-Manual Mode: 2.7 10*3/uL (ref 1.6–7.0)
Abs Lymphs: 1.4 10*3/uL (ref 0.8–3.1)
Abs Monos: 0.1 10*3/uL — ABNORMAL LOW (ref 0.2–0.8)
Eosinophils: 1 %
Hct: 29.2 % — ABNORMAL LOW (ref 34.0–45.0)
Hgb: 9.1 gm/dL — ABNORMAL LOW (ref 11.2–15.7)
Lymphocytes: 32 %
MCH: 27.2 pg (ref 26.0–32.0)
MCHC: 31.2 g/dL — ABNORMAL LOW (ref 32.0–36.0)
MCV: 87.2 um3 (ref 79.0–95.0)
MPV: 11.1 fL (ref 9.4–12.4)
Monocytes: 3 %
NRBC: 1 /100 WBC (ref <1)
Plt Count: 51 10*3/uL — ABNORMAL LOW (ref 140–370)
RBC: 3.35 10*6/uL — ABNORMAL LOW (ref 3.90–5.20)
RDW: 18.6 % — ABNORMAL HIGH (ref 12.0–14.0)
Segs: 62 %
WBC: 4.3 10*3/uL (ref 4.0–10.0)

## 2016-08-23 LAB — APTT, BLOOD: PTT: 35 s — ABNORMAL HIGH (ref 25–34)

## 2016-08-23 LAB — CPK-CREATINE PHOSPHOKINASE, BLOOD
CPK: 22 U/L (ref 0–175)
CPK: 18 U/L (ref 0–175)

## 2016-08-23 LAB — PRO BNP, BLOOD: BNPP: 13583 pg/mL — ABNORMAL HIGH (ref 0–899)

## 2016-08-23 LAB — TSH, BLOOD: TSH: 4.37 u[IU]/mL — ABNORMAL HIGH (ref 0.27–4.20)

## 2016-08-23 LAB — TROPONIN T GEN 5 W/REFLEX TO CK/CKMB
Troponin T Gen 5 w/Reflex CK/CKMB: 61 ng/L — ABNORMAL HIGH (ref <14)
Troponin T Gen 5 w/Reflex CK/CKMB: 58 ng/L — ABNORMAL HIGH (ref <14)

## 2016-08-23 LAB — PROTHROMBIN TIME, BLOOD
INR: 1.3
PT,Patient: 14.6 s — ABNORMAL HIGH (ref 9.7–12.5)

## 2016-08-23 NOTE — ED Notes (Signed)
Pt ambulate to restroom, steady gait noted.

## 2016-08-23 NOTE — ED Notes (Signed)
Dr Clark at bedside

## 2016-08-23 NOTE — ED Notes (Signed)
US tech at bedside

## 2016-08-23 NOTE — ED Notes (Signed)
[place] called for updated wait time, per tech 1.5 hour wait.

## 2016-08-23 NOTE — ED MD Progress Note (Signed)
Workup Review     Attending level of care signed out to me by Dr. Carlis Abbott    In brief, this patient is a 66 year old female who has pulm HTN/flolan, swollen L leg x 2 weeks  DVT studies ordered      Addendum:  No DVT noted on preliminary ultrasound reading.  On my reassessment of her.  She has a hard calcification of her upper left thigh which she reports is resolving after her fall last month.  This is only minimally painful.  She does complain of pain in her left tibia and fibula region she does not report any new identifiable trauma.  I did obtain x-rays of his of her lower leg and these were negative for acute findings per my interpretation.    Additionally this patient had a BNP  sent.  This level was in the 13,000 range which is consistent with her previous.  Additionally she had a 5th generation troponin come back at 61.  I did repeat several hours later and this was 558.  I did speak with the pulmonary hypertension fellow on-call and discussed these findings with him.  He did not think that these represented any acute concerns and are consistent with her underlying cardiopulmonary disease.  Therefore I think she can be discharged home.  She will need some close follow-up given her ongoing left leg pain. I would like for her to follow up with either her primary care physician or Dr. [name], her pulmonary hypertension doctor.  She has been instructed to return emergency department immediately for any new worsening symptoms or for any other concerns    Results for orders placed or performed during the hospital encounter of 08/22/16   Comprehensive Metabolic Panel Green   Result Value Ref Range    Glucose 80 70 - 99 mg/dL    BUN 49 (H) 8 - 23 mg/dL    Creatinine 1.29 (H) 0.51 - 0.95 mg/dL    GFR 41 mL/min    Sodium 136 136 - 145 mmol/L    Potassium 4.2 3.5 - 5.1 mmol/L    Chloride 98 98 - 107 mmol/L    Bicarbonate 23 22 - 29 mmol/L    Anion Gap 15 7 - 15 mmol/L    Calcium 9.5 8.5 - 10.6 mg/dL    Total Protein 6.9  6.0 - 8.0 g/dL    Albumin 3.8 3.5 - 5.2 g/dL    Bilirubin, Tot 0.60 <1.2 mg/dL    AST (SGOT) 16 0 - 32 U/L    ALT (SGPT) 16 0 - 33 U/L    Alkaline Phos 150 (H) 35 - 140 U/L   TSH, Blood - See Instructions   Result Value Ref Range    TSH 4.37 (H) 0.27 - 4.20 uIU/mL   Pro Bnp, Blood Green Plasma Separator Tube   Result Value Ref Range    BNPP 13583 (H) 0 - 899 pg/mL   Troponin T Gen 5 w/Reflex to CK/CKMB Green Plasma Separator Tube   Result Value Ref Range    Troponin T Gen 5 w/Reflex CK/CKMB 61 (H) <14 ng/L   CBC w/ Diff Lavender   Result Value Ref Range    WBC 4.3 4.0 - 10.0 1000/mm3    RBC 3.35 (L) 3.90 - 5.20 mill/mm3    Hgb 9.1 (L) 11.2 - 15.7 gm/dL    Hct 29.2 (L) 34.0 - 45.0 %    MCV 87.2 79.0 - 95.0 um3    MCH 27.2  26.0 - 32.0 pgm    MCHC 31.2 (L) 32.0 - 36.0 g/dL    RDW 18.6 (H) 12.0 - 14.0 %    MPV 11.1 9.4 - 12.4 fL    Plt Count 51 (L) 140 - 370 1000/mm3    NRBC 1 <1 /100 WBC    Segs 62 %    Lymphocytes 32 %    Monocytes 3 %    Eosinophils 1 %    ANC-Manual Mode 2.7 1.6 - 7.0 1000/mm3    Abs Lymphs 1.4 0.8 - 3.1 1000/mm3    Abs Monos 0.1 (L) 0.2 - 0.8 1000/mm3    Diff Type Manual    aPTT, Blood Blue   Result Value Ref Range    PTT 35 (H) 25 - 34 sec   Prothrombin Time, Blood Blue   Result Value Ref Range    PT,Patient 14.6 (H) 9.7 - 12.5 sec    INR 1.3    MDIFF   Result Value Ref Range    Metamyelocytes 1 %    Myelocytes 1 %    Number of Cells Counted 113     Immature Granulocytes Absolute Manual 0.1 0.0 - 0.1 1000/mm3    Plt Est Decreased     RBC Comment Present     Poikilocytosis 2+     Polychromasia 2+     Ovalocytes 1+     Burr Cell 1+     Tear Drop Cells 1+    CPK, Blood   Result Value Ref Range    CPK 22 0 - 175 U/L   CKMB + Index (No CPK), Blood   Result Value Ref Range    CK-MB 2.0 0.0 - 2.8 ng/mL    CK-MB Index - %   Troponin T Gen 5 w/Reflex to CK/CKMB Green Plasma Separator Tube   Result Value Ref Range    Troponin T Gen 5 w/Reflex CK/CKMB 58 (H) <14 ng/L   CPK, Blood   Result Value Ref Range     CPK 18 0 - 175 U/L   CKMB + Index (No CPK), Blood   Result Value Ref Range    CK-MB 2.0 0.0 - 2.8 ng/mL    CK-MB Index - %

## 2016-08-23 NOTE — ED Notes (Signed)
Pt aaox4, stable for discharge. Instructions, and follow up given. Pt verbalized understanding, all questions answered. Pt ambulate out steady gait noted, accompanied by family member.

## 2016-08-23 NOTE — ED Notes (Signed)
Dr. Ishimine at bedside for evaluation.

## 2016-08-23 NOTE — Discharge Instructions (Signed)
Leg Pain    You have been diagnosed with leg pain.    Many things can cause leg pain. Most of the causes are not dangerous and will get better on their own. These can include muscle cramps, bruises, strains, pinched nerves, and minor skin infections.    You had an exam by your doctor today. He or she decided that the cause of your leg pain does not seem to be serious or dangerous.    During your visit, you had an ultrasound (duplex doppler) to make sure that you didn't have a blood clot in your leg.    If your pain continues, you might need another exam or more tests. This is to find out why you have leg pain. The cause of your symptoms doesn't seem dangerous now. You don't need to stay in the hospital.    Though we dont believe your condition is dangerous right now, it is important to be careful. Sometimes a problem that seems mild can become serious later. This is why it is very important that you return here or go to the nearest Emergency Department if you are not improving or your symptoms are getting worse.    Some things you may try at home are:    Over-the-counter pain medications like that have ibuprofen (Advil/Motrin) or acetaminophen (Tylenol) in them. Follow the directions on the package.   Rest the leg as needed. As soon as you are able, start moving your leg again to keep it from getting stiff.    Return here or go to the nearest Emergency Department or follow-up with your doctor if you are not getting better as expected.    Follow the instructions for any medication you are prescribed.     YOU SHOULD SEEK MEDICAL ATTENTION IMMEDIATELY, EITHER HERE OR AT THE NEAREST EMERGENCY DEPARTMENT, IF ANY OF THE FOLLOWING HAPPENS:   You have a fever (temperature higher than 100.66F or 38C).   Your pain does not go away or gets worse.   Your leg or joints (hip, knee, ankle, etc.) are red or swollen.   Your chest hurts or you get short of breath.   You have any other symptoms or concerns, or  don't get better as expected.    If you can't follow up with your doctor, or if at any time you feel you need to be rechecked or seen again, come back here or go to the nearest emergency department.      please follow up with your primary care physician or Dr. [name] this week.  Return to emergency department immediately for any new worsening symptoms or for any other concerns

## 2016-08-24 LAB — FREE THYROXINE, BLOOD: Free T4: 1.43 ng/dL (ref 0.93–1.70)

## 2016-08-28 ENCOUNTER — Encounter (HOSPITAL_BASED_OUTPATIENT_CLINIC_OR_DEPARTMENT_OTHER): Payer: Self-pay

## 2016-08-28 LAB — COMPREHENSIVE METABOLIC PANEL, BLOOD
ALT (SGPT): 13
AST (SGOT): 17
Albumin/Globulin Ratio: 1.4
Albumin: 3.8
Alkaline Phos: 148
BUN: 41
Bicarbonate: 23
Bilirubin, Total: 0.5
Calcium: 9.6
Chloride: 100
Creatinine: 1.3
Globulin: 2.7
Glucose: 92
Potassium: 4.3
Sodium: 139
Total Protein: 6.5

## 2016-08-28 LAB — CBC WITH DIFF, BLOOD
Hct: 30.2
Hgb: 9.6
MCH: 27
MCHC: 31.8
MCV: 85
Plt Count: 55
RBC: 3.55
RDW: 18.4
WBC: 4

## 2016-08-28 NOTE — Progress Notes (Unsigned)
Results of CBC drawn 8/15 at [facility] rec'd and entered in [facility] as external results.  Report is labelled and given to MA side B for scanning into EPIC.

## 2016-09-04 ENCOUNTER — Encounter (HOSPITAL_BASED_OUTPATIENT_CLINIC_OR_DEPARTMENT_OTHER): Payer: Self-pay

## 2016-09-04 LAB — CBC WITH DIFF, BLOOD
Hct: 30.1
Hgb: 9.7
MCH: 27.4
MCHC: 32.2
MCV: 85
Plt Count: 63
RBC: 3.54
RDW: 17.2
WBC: 3.6

## 2016-09-04 LAB — COMPREHENSIVE METABOLIC PANEL, BLOOD
ALT (SGPT): 13
AST (SGOT): 17
Albumin/Globulin Ratio: 1.3
Albumin: 4
Alkaline Phos: 147
BUN: 38
Bicarbonate: 23
Bilirubin, Total: 0.4
Calcium: 9.5
Chloride: 100
Creatinine: 1.21
Globulin: 3
Glucose: 98
Potassium: 4.8
Sodium: 136
Total Protein: 7

## 2016-09-04 NOTE — Progress Notes (Unsigned)
Lab results received from [facility] via fax.  CBC/CMP drawn 09/03/16 entered as external result.  Report is labelled and brought to side B MA for scanning into EPIC

## 2016-09-04 NOTE — Interdisciplinary (Signed)
Opened in error

## 2016-09-04 NOTE — Telephone Encounter (Signed)
Opened in error

## 2016-09-10 ENCOUNTER — Encounter (HOSPITAL_COMMUNITY): Payer: Self-pay | Admitting: Pulmonary Medicine

## 2016-09-10 ENCOUNTER — Other Ambulatory Visit (INDEPENDENT_AMBULATORY_CARE_PROVIDER_SITE_OTHER): Payer: Medicare Other | Attending: Hematology

## 2016-09-10 ENCOUNTER — Ambulatory Visit: Payer: Medicare Other | Attending: Pulmonary Medicine | Admitting: Pulmonary Medicine

## 2016-09-10 VITALS — BP 134/65 | HR 94 | Resp 14 | Ht 62.0 in | Wt 148.0 lb

## 2016-09-10 DIAGNOSIS — D7589 Other specified diseases of blood and blood-forming organs: Secondary | ICD-10-CM | POA: Insufficient documentation

## 2016-09-10 DIAGNOSIS — I270 Primary pulmonary hypertension: Principal | ICD-10-CM | POA: Insufficient documentation

## 2016-09-10 DIAGNOSIS — I509 Heart failure, unspecified: Secondary | ICD-10-CM | POA: Insufficient documentation

## 2016-09-10 DIAGNOSIS — I2721 Secondary pulmonary arterial hypertension: Secondary | ICD-10-CM | POA: Insufficient documentation

## 2016-09-10 DIAGNOSIS — D696 Thrombocytopenia, unspecified: Secondary | ICD-10-CM | POA: Insufficient documentation

## 2016-09-10 DIAGNOSIS — T50905A Adverse effect of unspecified drugs, medicaments and biological substances, initial encounter: Secondary | ICD-10-CM | POA: Insufficient documentation

## 2016-09-10 LAB — CBC WITH DIFF, BLOOD
ANC-Manual Mode: 2.1 10*3/uL (ref 1.6–7.0)
Abs Lymphs: 0.9 10*3/uL (ref 0.8–3.1)
Abs Monos: 0.1 10*3/uL — ABNORMAL LOW (ref 0.2–0.8)
Hct: 32.8 % — ABNORMAL LOW (ref 34.0–45.0)
Hgb: 10.1 gm/dL — ABNORMAL LOW (ref 11.2–15.7)
Lymphocytes: 29 %
MCH: 26.7 pg (ref 26.0–32.0)
MCHC: 30.8 g/dL — ABNORMAL LOW (ref 32.0–36.0)
MCV: 86.8 um3 (ref 79.0–95.0)
MPV: 9.9 fL (ref 9.4–12.4)
Monocytes: 4 %
Plt Count: 61 10*3/uL — ABNORMAL LOW (ref 140–370)
RBC: 3.78 10*6/uL — ABNORMAL LOW (ref 3.90–5.20)
RDW: 16.5 % — ABNORMAL HIGH (ref 12.0–14.0)
Segs: 65 %
WBC: 3.2 10*3/uL — ABNORMAL LOW (ref 4.0–10.0)

## 2016-09-10 LAB — MDIFF
Bands: 1 % (ref 0–15)
Myelocytes: 1 %
Number of Cells Counted: 110
Plt Est: DECREASED
RBC Comment: NORMAL

## 2016-09-10 LAB — COMPREHENSIVE METABOLIC PANEL, BLOOD
ALT (SGPT): 12 U/L (ref 0–33)
AST (SGOT): 18 U/L (ref 0–32)
Albumin: 4.1 g/dL (ref 3.5–5.2)
Alkaline Phos: 164 U/L — ABNORMAL HIGH (ref 35–140)
Anion Gap: 15 mmol/L (ref 7–15)
BUN: 31 mg/dL — ABNORMAL HIGH (ref 8–23)
Bicarbonate: 23 mmol/L (ref 22–29)
Bilirubin, Tot: 0.44 mg/dL (ref <1.2)
Calcium: 9.6 mg/dL (ref 8.5–10.6)
Chloride: 103 mmol/L (ref 98–107)
Creatinine: 1.18 mg/dL — ABNORMAL HIGH (ref 0.51–0.95)
GFR: 46 mL/min
Glucose: 121 mg/dL — ABNORMAL HIGH (ref 70–99)
Potassium: 3.6 mmol/L (ref 3.5–5.1)
Sodium: 141 mmol/L (ref 136–145)
Total Protein: 7.3 g/dL (ref 6.0–8.0)

## 2016-09-10 LAB — DIGOXIN, BLOOD: Digoxin: 0.7 ng/mL (ref 0.6–1.2)

## 2016-09-10 NOTE — Progress Notes (Signed)
CLINIC:      REPORT TYPE:  NOTE    Dictating Practioner:  Camelia Eng. [name], MD    Staff Physician:  Camelia Eng. [name], MD    DATE OF SERVICE:  09/10/2016    REASON FOR VISIT:      CHIEF COMPLAINT:  Followup for pulmonary arterial hypertension.    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   She has recently completed a prolonged steroid taper for an  inflammatory component related to her pulmonary hypertension and  overlapping connective tissue disease syndrome.  She has tapered down  on her steroid.  She has reported no change in her symptoms and has  tolerated this well.  She has reported mild increasing lower  extremity edema over the past 10 days or so.  This is despite taking  her usual dose of diuretics and maintaining compliant with a low-salt  diet of 100 mg p.o. 3 times daily.  She has been followed by Dr.  Baldomero Lamy of [facility] regarding her thrombocytopenia and has  had outpatient labs showing continued increase in her platelet count  most recent of which is 63.     REVIEW OF SYSTEMS:  Continues to have significant easy bruising.    ALLERGIES:  ALLOPURINOL, LEVAQUIN, ULORIC, [place], SULFA.    CURRENT MEDICATIONS:    1.  Astelin nasal spray.  2.  Digoxin 0.125 mg p.o. daily.  3.  Iron sulfate 325 mg p.o. daily.  4.  Lasix 100 mg p.o. 3 times daily.  5.  Folic acid 720 mcg p.o. daily.  6.  Intravenous epoprostenol 29 ng/kg per minute.  7.  Sildenafil 80 mg p.o. 3 times daily.  8.  Spironolactone 75 mg p.o. daily.    PHYSICAL EXAMINATION:    VITAL SIGNS:  Blood pressure 134/65, pulse 94, respirations 14,  temperature not recorded, sats 97% on 3 L nasal cannula.  Weight 148  pounds.   GENERAL:  Chronically ill-appearing with multiple bruises across  chest and arms.  Able  to speak in full sentences.   OROPHARYNX:  Clear.   NECK:  Flat neck veins.  No thyromegaly or lymphadenopathy.     HEART:  Regular rate and rhythm.  Loud 2nd heart sound.  Positive  systolic murmur.   LUNGS:  Clear to auscultation bilaterally.  No rales, no wheezes.     ABDOMEN:  Obese.  Mildly distended.   EXTREMITIES:  1+ edema right lower extremity, 1+ edema left lower  extremity.   SKIN:  Significant areas of bruising and venous stasis changes.    ASSESSMENT:  Ms Diana Chambers is a 66 year old female with history  of [place] group 1 pulmonary arterial hypertension,  history of presence of oxygen use and a connective tissue disease who  has completed a prolonged course of steroid taper and appears stable  on today's exam following this taper.     PLAN:  We will continue her on her PH medications including  sildenafil 80 mg p.o. 3 times daily and intravenous epoprostenol 29  ng/kg per minute.  Additionally, we will continue her on Lasix.  We  will, however, ask her to increase her dose to Lasix 100 mg p.o. 4  times daily for a few days and see if the  edema in her right lower  extremity improves.  We will plan for ongoing weekly lab tests to  follow her creatinine as well as her electrolytes and additionally  follow her platelets and hemoglobin.  We will see her back in clinic  in approximately 1 month for followup.       Job #:  [id number]      DD:  09/10/2016  DT:  09/10/2016 15:41:50  DSP/MODL          [id number]    Referring Physician:

## 2016-09-10 NOTE — Progress Notes (Signed)
This office note has been dictated.

## 2016-09-12 LAB — JAK2 V617F MUTATION, QUALITATIVE ANALYSIS, BLOOD
JAK2 Interp Comments: NOT DETECTED
JAK2 V617F Mutation: NOT DETECTED

## 2016-10-03 ENCOUNTER — Telehealth (HOSPITAL_BASED_OUTPATIENT_CLINIC_OR_DEPARTMENT_OTHER): Payer: Self-pay | Admitting: Hematology & Oncology

## 2016-10-03 ENCOUNTER — Encounter (HOSPITAL_BASED_OUTPATIENT_CLINIC_OR_DEPARTMENT_OTHER): Payer: Self-pay

## 2016-10-03 DIAGNOSIS — D696 Thrombocytopenia, unspecified: Principal | ICD-10-CM

## 2016-10-03 LAB — CBC WITH DIFF, BLOOD
Hct: 30.9
Hct: 30.4
Hgb: 9.6
Hgb: 9.7
MCH: 25.3
MCH: 25.5
MCHC: 31.1
MCHC: 31.9
MCV: 81
MCV: 80
Plt Count: 83
Plt Count: 60
RBC: 3.8
RBC: 3.81
RDW: 16.4
RDW: 16.5
WBC: 3.8
WBC: 3.8

## 2016-10-03 LAB — COMPREHENSIVE METABOLIC PANEL, BLOOD
ALT (SGPT): 12
ALT (SGPT): 12
AST (SGOT): 14
AST (SGOT): 12
Albumin/Globulin Ratio: 1.3
Albumin/Globulin Ratio: 1.2
Albumin: 3.8
Albumin: 3.9
Alkaline Phos: 188
Alkaline Phos: 179
BUN: 41
BUN: 36
Bicarbonate: 22
Bicarbonate: 21
Bilirubin, Total: 0.4
Bilirubin, Total: 0.3
Calcium: 9.8
Calcium: 9.7
Chloride: 104
Chloride: 103
Creatinine: 1.32
Creatinine: 1.24
Globulin: 2.9
Globulin: 3.2
Glucose: 112
Glucose: 118
Potassium: 4.1
Potassium: 4.4
Sodium: 139
Sodium: 140
Total Protein: 6.7
Total Protein: 7.1

## 2016-10-03 NOTE — Progress Notes (Signed)
Lab results received from [facility].  CBC/CMP drawn 10/01/16 entered as external result.  Report is labelled and brought to side B MA for scanning into EPIC.

## 2016-10-03 NOTE — Telephone Encounter (Signed)
Called patient, let her know that blood counts fortunately show improvement of platelet count, now stable >50.  Hb also remains stable in 9-10 range.    Let her know that weekly CBC are no longer necessary.  She can check CBC monthly when she sees Dr. [name] at [place] (standing order placed).    Will notify Dr. [name] via this note.  Patient may be referred back for sooner appointment any time, if cytopenias worsen or new symptoms develop.

## 2016-10-08 ENCOUNTER — Ambulatory Visit (HOSPITAL_COMMUNITY): Payer: Medicare Other | Admitting: Pulmonary Medicine

## 2016-10-15 ENCOUNTER — Encounter (HOSPITAL_COMMUNITY): Payer: Self-pay | Admitting: Pulmonary Medicine

## 2016-10-15 ENCOUNTER — Ambulatory Visit: Payer: Medicare Other | Attending: Pulmonary Medicine | Admitting: Pulmonary Medicine

## 2016-10-15 ENCOUNTER — Other Ambulatory Visit (INDEPENDENT_AMBULATORY_CARE_PROVIDER_SITE_OTHER): Payer: Medicare Other | Attending: Pulmonary Medicine

## 2016-10-15 VITALS — BP 135/70 | HR 86 | Temp 98.5°F | Resp 20 | Ht 62.0 in | Wt 148.0 lb

## 2016-10-15 DIAGNOSIS — I270 Primary pulmonary hypertension: Principal | ICD-10-CM | POA: Insufficient documentation

## 2016-10-15 DIAGNOSIS — D696 Thrombocytopenia, unspecified: Secondary | ICD-10-CM | POA: Insufficient documentation

## 2016-10-15 LAB — MDIFF
Myelocytes: 1 %
Number of Cells Counted: 114
Plt Est: DECREASED

## 2016-10-15 LAB — COMPREHENSIVE METABOLIC PANEL, BLOOD
ALT (SGPT): 14 U/L (ref 0–33)
AST (SGOT): 21 U/L (ref 0–32)
Albumin: 3.8 g/dL (ref 3.5–5.2)
Alkaline Phos: 193 U/L — ABNORMAL HIGH (ref 35–140)
Anion Gap: 12 mmol/L (ref 7–15)
BUN: 43 mg/dL — ABNORMAL HIGH (ref 8–23)
Bicarbonate: 25 mmol/L (ref 22–29)
Bilirubin, Tot: 0.34 mg/dL (ref <1.2)
Calcium: 9.8 mg/dL (ref 8.5–10.6)
Chloride: 104 mmol/L (ref 98–107)
Creatinine: 1.24 mg/dL — ABNORMAL HIGH (ref 0.51–0.95)
GFR: 43 mL/min
Glucose: 127 mg/dL — ABNORMAL HIGH (ref 70–99)
Potassium: 4 mmol/L (ref 3.5–5.1)
Sodium: 141 mmol/L (ref 136–145)
Total Protein: 7.5 g/dL (ref 6.0–8.0)

## 2016-10-15 LAB — CBC WITH DIFF, BLOOD
ANC-Instrument: 2.3 10*3/uL (ref 1.6–7.0)
ANC-Manual Mode: 2.6 10*3/uL (ref 1.6–7.0)
Abs Basophils: 0.1 10*3/uL (ref <0.1)
Abs Lymphs: 1 10*3/uL (ref 0.8–3.1)
Abs Monos: 0.2 10*3/uL (ref 0.2–0.8)
Basophils: 2 %
Hct: 32.3 % — ABNORMAL LOW (ref 34.0–45.0)
Hgb: 9.9 gm/dL — ABNORMAL LOW (ref 11.2–15.7)
Lymphocytes: 25 %
MCH: 25.6 pg — ABNORMAL LOW (ref 26.0–32.0)
MCHC: 30.7 g/dL — ABNORMAL LOW (ref 32.0–36.0)
MCV: 83.7 um3 (ref 79.0–95.0)
Monocytes: 5 %
Plt Count: 72 10*3/uL — ABNORMAL LOW (ref 140–370)
RBC: 3.86 10*6/uL — ABNORMAL LOW (ref 3.90–5.20)
RDW: 16.5 % — ABNORMAL HIGH (ref 12.0–14.0)
Segs: 67 %
WBC: 3.9 10*3/uL — ABNORMAL LOW (ref 4.0–10.0)

## 2016-10-16 NOTE — Progress Notes (Signed)
CLINIC:      REPORT TYPE:  NOTE    Dictating Practioner:  Camelia Eng. [name], MD    Staff Physician:  Camelia Eng. [name], MD    DATE OF SERVICE:  10/15/2016    REASON FOR VISIT:      CHIEF COMPLAINT:  Followup for pulmonary arterial hypertension.    HISTORY OF PRESENT ILLNESS:  [name] is a 66 year old  female with history of WHO group 1 pulmonary arterial hypertension  with presence of connective tissue disease, as well as prior  anorexigenic exposure, who returns to clinic for followup.  Of note,  she had multiple prolonged hospitalizations beginning in the spring,  into the summer of 2018, both for decompensated heart failure, as  well as development of refractory thrombocytopenia.  These ultimately  required initiation of steroids, which seem to have an improvement.  She has subsequently tapered off the steroids and able to maintain  relative euvolemia with good clinical status at home since that time.   Also of note, her platelets have recovered not to her previous  baseline, but recovered significantly.  The presumed etiology for  thrombocytopenia was exposure to medication Uloric.     Since her last visit here, she reports stable cardiopulmonary  exercise symptoms.  She was able to travel to [place] to look for  property with her husband.  No recent chest pain, palpitations,  syncope.  She does have some lower extremity edema, which is stable  compared to her last clinic visit.     REVIEW OF SYSTEMS:  Negative as per HPI.    ALLERGIES:    1.  ALLOPURINOL.   2.  LEVAQUIN.   3.  ULORIC.   4.  OFLOXACIN.   5.  CHLORHEXIDINE.   6.  SULFA DRUGS.    BODY AFTER ALLERGIES:      CURRENT MEDICATIONS:    1.  Digoxin 125 mg p.o. daily.   2.  Iron sulfate 325 mg p.o. daily.    3.  Intravenous epoprostenol at 34 ng/kg per minute.   4.  Lasix 100 mg p.o. 3 times daily.    5.  Provera tablet 1.25 mg p.o. daily.   6.  Potassium chloride 40 mEq p.o. daily.   7.  Prednisone no longer taking.   8.  Sildenafil 80 mg p.o. 3  times daily.   9.  Spironolactone 75 mg p.o. daily.    PHYSICAL EXAMINATION:    VITAL SIGNS:  Blood pressure 135/70, pulse 86, respirations 20,  temperature 98.5, saturation 96% on 3 L nasal cannula.  Weight 148  pounds.   GENERAL:  Chronically ill-appearing, but no acute distress.  Able to  speak in full sentences.   HEENT:  Oropharynx clear.     NECK:  Moderate elevation jugular venous distention.  No thyromegaly  or lymphadenopathy.   HEART:  Loud 2nd heart sound.  Positive systolic murmur.  No S3  noted.   LUNGS:  Faint rales bilaterally.   ABDOMEN:  Moderately distended, nontender.     EXTREMITIES:  1 to 2+ edema right lower extremity, 1+ edema left  lower extremity.   SKIN:  Improved bruising.  No areas of digital ulceration.    ASSESSMENT:  [name] is a 66 year old female with  history of World Health Organization group 1 pulmonary hypertension  related to the presence of connective tissue disease and prior  anorexigenic exposure, with recovering thrombocytopenia, recovering  acute on chronic kidney injury, and [place]  Heart Association  functional class 3 level symptoms.     PLAN:  We will continue her current dose of intravenous epoprostenol  at 34 to 35 ng/kg per minute.  Continue current dose of sildenafil 80  mg p.o. t.i.d.  Continue her current dose of diuretics, Lasix 100 mg  p.o. 3 times daily with potassium supplementation, as well as  Aldactone.  We will plan to see her back in clinic in approximately 3  months.  We will continue to follow her with regular labs, but follow  her creatinine, electrolytes, as well as her platelet count.       Job #:  [id number]      DD:  10/16/2016  DT:  10/16/2016 11:47:38  DSP/MODL          [id number]    Referring Physician:

## 2016-10-16 NOTE — Progress Notes (Signed)
This office note has been dictated.

## 2016-11-04 ENCOUNTER — Other Ambulatory Visit (INDEPENDENT_AMBULATORY_CARE_PROVIDER_SITE_OTHER): Payer: Self-pay | Admitting: Internal Medicine

## 2016-11-05 NOTE — Telephone Encounter (Signed)
PCP (DR. [facility]) is not currently included in the Pharmacy Refill Clinic protocols. Thank you

## 2016-11-14 ENCOUNTER — Encounter (HOSPITAL_BASED_OUTPATIENT_CLINIC_OR_DEPARTMENT_OTHER): Payer: Self-pay

## 2016-11-14 LAB — CBC WITH DIFF, BLOOD
Hct: 32.1
Hgb: 10.1
MCH: 24.6
MCHC: 31.5
MCV: 78
Plt Count: 86
RBC: 4.11
RDW: 17.5
WBC: 4.1

## 2016-11-14 LAB — COMPREHENSIVE METABOLIC PANEL, BLOOD
ALT (SGPT): 14
AST (SGOT): 18
Albumin/Globulin Ratio: 1.1
Albumin: 3.9
Alkaline Phos: 184
BUN: 42
Bicarbonate: 24
Bilirubin, Total: 0.5
Calcium: 10
Chloride: 103
Creatinine: 1.28
Globulin: 3.4
Glucose: 113
Potassium: 4.3
Sodium: 141
Total Protein: 7.3

## 2016-11-14 NOTE — Progress Notes (Unsigned)
[facility]  CBC/CMP drawn 11/12/16 entered as external result.  Report is labelled and brought to side B MA for scanning into EPIC.

## 2017-01-21 ENCOUNTER — Encounter (HOSPITAL_COMMUNITY): Payer: Self-pay | Admitting: Pulmonary Medicine

## 2017-01-21 ENCOUNTER — Ambulatory Visit: Payer: PPO | Attending: Pulmonary Medicine | Admitting: Pulmonary Medicine

## 2017-01-21 VITALS — BP 136/68 | HR 88 | Temp 97.9°F | Resp 16 | Ht 62.0 in | Wt 129.9 lb

## 2017-01-21 DIAGNOSIS — I270 Primary pulmonary hypertension: Secondary | ICD-10-CM | POA: Insufficient documentation

## 2017-01-22 ENCOUNTER — Encounter (HOSPITAL_COMMUNITY): Payer: Self-pay | Admitting: Pulmonary Medicine

## 2017-01-22 DIAGNOSIS — I270 Primary pulmonary hypertension: Secondary | ICD-10-CM

## 2017-01-22 NOTE — Progress Notes (Signed)
This office note has been dictated.

## 2017-01-23 NOTE — Progress Notes (Signed)
CLINIC:      REPORT TYPE:  NOTE    Dictating Practioner:  Camelia Eng. [name], MD    Staff Physician:  Camelia Eng. [name], MD    DATE OF SERVICE:  01/21/2017    REASON FOR VISIT:      CHIEF COMPLAINT:  Followup for pulmonary arterial hypertension.    HISTORY OF PRESENT ILLNESS:  [name] is a 67 year old  female with a history of WHO group 1 pulmonary arterial hypertension  associated with presence of connective tissue, disease returns to  clinic for a followup.  Since she was last seen, she reports some  mild weight loss, but relatively good control of her edema and  abdominal distention.  She reports taking metolazone approximately 3  days out of every month with improvement in her abdominal distention  and lower extremity edema.  Her last dose of metolazone was taken on  January 3rd, 4th and 5th.  Subsequent to that, she had labs drawn,  which are currently in the media tab in Epic demonstrating a mild  increase in her creatinine as well as a mild decrease in her  potassium level.  Otherwise, she reports stable symptoms, stable  oxygen use.     REVIEW OF SYSTEMS:  Negative other than as in HPI.    ALLERGIES:    1.  LEVAQUIN.  2.  OFLOXACIN.  3.  CHLORHEXIDINE.  4.  SULFA DRUGS.    BODY AFTER ALLERGIES:      CURRENT MEDICATIONS:  Please see Epic for complete list.    PHYSICAL EXAM:  VITAL SIGNS:  Blood pressure 136/68, pulse 88,  respirations 16, temperature 97.9, saturation 96% on 2 L nasal  cannula.  Weight 129 pounds, height 5 feet 2 inches.   GENERAL:  Chronically ill-appearing, no acute distress.  Able to  speak in full sentences.   HEENT:  Oropharynx is clear.   NECK:  Mild elevation in jugular venous distention.  No thyromegaly  or lymphadenopathy.   HEART:  Regular rate and rhythm.  Loud 2nd heart sound.  Positive  systolic murmur.  No S3 noted.   LUNGS:  Clear to auscultation bilaterally.  No rales or wheezes.     ABDOMEN:  Soft, mildly distended, nontender.   EXTREMITIES:  Trace edema bilateral lower  extremities.   SKIN:  Multiple areas of rash associated with the presence of  epoprostenol.  No areas of digital ulceration.     ASSESSMENT:    [name] is a 67 year old female with a history of  [place] group 1 pulmonary hypertension associated  with the presence of connective tissue disease that is stable.  [place] Heart Association functional class 3 symptoms on intravenous  epoprostenol and high-dose sildenafil.     PLAN:  We will continue her current PAH targeted therapy including  intravenous epoprostenol and sildenafil.  Patient is requesting that  she continue for as long as possible on the original .  We will  make the suggestion to  pharmacy, however, we are at the mercy of  the  pharmacy to be able to fill this order.  In the meantime, we  will plan to see her back in clinic in approximately 4-6 months with  a repeat echocardiogram.  We will continue her current dose of  diuretics, and continue to perform lab surveillance, follow her  kidney function and electrolytes on an outpatient basis as well as  follow the recovery of her platelet function.  Job #:  [id number]      DD:  01/22/2017  DT:  01/22/2017 13:46:14  DSP/MODL          [id number]    Referring Physician:

## 2017-01-23 NOTE — Progress Notes (Deleted)
Note moved to correct encounter.

## 2017-03-12 ENCOUNTER — Encounter (HOSPITAL_COMMUNITY): Payer: Self-pay

## 2017-03-12 DIAGNOSIS — I5023 Acute on chronic systolic (congestive) heart failure: Secondary | ICD-10-CM

## 2017-03-12 DIAGNOSIS — E876 Hypokalemia: Principal | ICD-10-CM

## 2017-03-12 DIAGNOSIS — D696 Thrombocytopenia, unspecified: Secondary | ICD-10-CM

## 2017-04-01 ENCOUNTER — Other Ambulatory Visit (HOSPITAL_BASED_OUTPATIENT_CLINIC_OR_DEPARTMENT_OTHER): Payer: Self-pay | Admitting: Pharmacist Clinician (PhC)/ Clinical Pharmacy Specialist

## 2017-04-01 NOTE — Progress Notes (Signed)
Medication list has been reviewed for Epic [facility] purposes.

## 2017-05-15 ENCOUNTER — Inpatient Hospital Stay
Admission: EM | Admit: 2017-05-15 | Discharge: 2017-07-15 | DRG: 291 | Disposition: A | Discharge status: Home Health | Payer: PPO | Attending: Pulmonary Medicine | Admitting: Pulmonary Medicine

## 2017-05-15 ENCOUNTER — Emergency Department (HOSPITAL_COMMUNITY): Payer: PPO

## 2017-05-15 DIAGNOSIS — R609 Edema, unspecified: Secondary | ICD-10-CM

## 2017-05-15 DIAGNOSIS — M3500 Sicca syndrome, unspecified: Secondary | ICD-10-CM | POA: Diagnosis present

## 2017-05-15 DIAGNOSIS — I071 Rheumatic tricuspid insufficiency: Secondary | ICD-10-CM | POA: Diagnosis present

## 2017-05-15 DIAGNOSIS — E8770 Fluid overload, unspecified: Secondary | ICD-10-CM

## 2017-05-15 DIAGNOSIS — K219 Gastro-esophageal reflux disease without esophagitis: Secondary | ICD-10-CM | POA: Diagnosis not present

## 2017-05-15 DIAGNOSIS — R188 Other ascites: Secondary | ICD-10-CM | POA: Diagnosis present

## 2017-05-15 DIAGNOSIS — M329 Systemic lupus erythematosus, unspecified: Secondary | ICD-10-CM | POA: Diagnosis present

## 2017-05-15 DIAGNOSIS — Z452 Encounter for adjustment and management of vascular access device: Secondary | ICD-10-CM

## 2017-05-15 DIAGNOSIS — I2721 Secondary pulmonary arterial hypertension: Secondary | ICD-10-CM | POA: Diagnosis present

## 2017-05-15 DIAGNOSIS — Z6823 Body mass index (BMI) 23.0-23.9, adult: Secondary | ICD-10-CM

## 2017-05-15 DIAGNOSIS — T50995A Adverse effect of other drugs, medicaments and biological substances, initial encounter: Secondary | ICD-10-CM | POA: Diagnosis present

## 2017-05-15 DIAGNOSIS — I50812 Chronic right heart failure: Secondary | ICD-10-CM

## 2017-05-15 DIAGNOSIS — Y92238 Other place in hospital as the place of occurrence of the external cause: Secondary | ICD-10-CM | POA: Diagnosis not present

## 2017-05-15 DIAGNOSIS — Z79899 Other long term (current) drug therapy: Secondary | ICD-10-CM

## 2017-05-15 DIAGNOSIS — Y828 Other medical devices associated with adverse incidents: Secondary | ICD-10-CM | POA: Diagnosis not present

## 2017-05-15 DIAGNOSIS — E876 Hypokalemia: Secondary | ICD-10-CM | POA: Diagnosis not present

## 2017-05-15 DIAGNOSIS — N179 Acute kidney failure, unspecified: Secondary | ICD-10-CM | POA: Diagnosis present

## 2017-05-15 DIAGNOSIS — A0472 Enterocolitis due to Clostridium difficile, not specified as recurrent: Secondary | ICD-10-CM | POA: Diagnosis not present

## 2017-05-15 DIAGNOSIS — E43 Unspecified severe protein-calorie malnutrition: Secondary | ICD-10-CM | POA: Diagnosis present

## 2017-05-15 DIAGNOSIS — K649 Unspecified hemorrhoids: Secondary | ICD-10-CM | POA: Diagnosis not present

## 2017-05-15 DIAGNOSIS — D61818 Other pancytopenia: Secondary | ICD-10-CM | POA: Diagnosis not present

## 2017-05-15 DIAGNOSIS — Z87891 Personal history of nicotine dependence: Secondary | ICD-10-CM

## 2017-05-15 DIAGNOSIS — Z881 Allergy status to other antibiotic agents status: Secondary | ICD-10-CM

## 2017-05-15 DIAGNOSIS — D509 Iron deficiency anemia, unspecified: Secondary | ICD-10-CM | POA: Diagnosis present

## 2017-05-15 DIAGNOSIS — B370 Candidal stomatitis: Secondary | ICD-10-CM | POA: Diagnosis not present

## 2017-05-15 DIAGNOSIS — Z66 Do not resuscitate: Secondary | ICD-10-CM | POA: Diagnosis not present

## 2017-05-15 DIAGNOSIS — R0981 Nasal congestion: Secondary | ICD-10-CM

## 2017-05-15 DIAGNOSIS — I50813 Acute on chronic right heart failure: Principal | ICD-10-CM | POA: Diagnosis present

## 2017-05-15 DIAGNOSIS — Z882 Allergy status to sulfonamides status: Secondary | ICD-10-CM

## 2017-05-15 DIAGNOSIS — I4891 Unspecified atrial fibrillation: Secondary | ICD-10-CM | POA: Diagnosis not present

## 2017-05-15 DIAGNOSIS — T82838A Hemorrhage of vascular prosthetic devices, implants and grafts, initial encounter: Secondary | ICD-10-CM | POA: Diagnosis not present

## 2017-05-15 DIAGNOSIS — Z9689 Presence of other specified functional implants: Secondary | ICD-10-CM | POA: Diagnosis present

## 2017-05-15 DIAGNOSIS — Z888 Allergy status to other drugs, medicaments and biological substances status: Secondary | ICD-10-CM

## 2017-05-15 DIAGNOSIS — Z8249 Family history of ischemic heart disease and other diseases of the circulatory system: Secondary | ICD-10-CM

## 2017-05-15 DIAGNOSIS — E871 Hypo-osmolality and hyponatremia: Secondary | ICD-10-CM | POA: Diagnosis not present

## 2017-05-15 DIAGNOSIS — J9611 Chronic respiratory failure with hypoxia: Secondary | ICD-10-CM | POA: Diagnosis present

## 2017-05-15 DIAGNOSIS — Z515 Encounter for palliative care: Secondary | ICD-10-CM

## 2017-05-15 DIAGNOSIS — N182 Chronic kidney disease, stage 2 (mild): Secondary | ICD-10-CM | POA: Diagnosis present

## 2017-05-15 DIAGNOSIS — Z818 Family history of other mental and behavioral disorders: Secondary | ICD-10-CM

## 2017-05-15 DIAGNOSIS — Z9981 Dependence on supplemental oxygen: Secondary | ICD-10-CM

## 2017-05-15 DIAGNOSIS — T82514A Breakdown (mechanical) of infusion catheter, initial encounter: Secondary | ICD-10-CM | POA: Diagnosis not present

## 2017-05-15 DIAGNOSIS — D696 Thrombocytopenia, unspecified: Secondary | ICD-10-CM | POA: Diagnosis present

## 2017-05-15 DIAGNOSIS — K5900 Constipation, unspecified: Secondary | ICD-10-CM | POA: Diagnosis not present

## 2017-05-15 NOTE — ED Notes (Addendum)
Pulmonary HTN Notification    Nursing Supervisor paged, Pulmonary HTN RN paged, Pharmacy notified, and Charge Nurse notified of pt arrival in ED.yes - [phone]     Does medication have enough ice/is it cold enough?no  Is pump on run: yes - 46ml/24hours  Reserve volume:55.7  ml  Amount given: 41 ml  The amount the pump delivers 62ml/24hrs  Pharmacy has pt's dosing weight: 86 kg.   Pharmacy has dosage: 35 nanograms/kg  Prostacyclin was last changed at: n/a

## 2017-05-15 NOTE — H&P (Signed)
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 On epoprostenol 35 ng, revatio 80 tid, lasix 100 tid, spironolactone.  .   # Low Plt count    She has noted increasing leg swelling. She is between 130-135 lbs. She doesn't know her dry wt.  No syncope. No chest pain. No SOB. No fever/cough. She was recently admitted in [place] for 6 days April 22-28, admitted for diarrhea and she was taken off lasix then. She restarted taking lasix again when her leg edema came back on april 30th.    This note was dictated using a voice translation software. Please overlook any typos.    No intake or output data in the 24 hours ending 05/16/17 0047    IV Drips      LINES  Patient Lines/Drains/Airways Status    Active PICC Line / CVC Line / PIV Line / Drain / Airway / Intraosseous Line / Epidural Line / ART Line / Line Type / Wound     Name: Placement date: Placement time: Site: Days:    Peripheral IV - 22 G Left Forearm  05/15/17   2327   Forearm  less than 1                REVIEW OF SYSTEMS: A twelve-point review of systems was negative unless otherwise noted above.  Constitutional: No fevers, chills or night sweats; weight stable  HEENT: No headache. No sinus pain or drainage. No visual symptoms; No oral lesions/pain. No odynphagia or dysphagia  Heme: No easy bruising, epistaxis or bleeding gums  Pulmonary: No cough, wheezing, dyspnea, and chest pain  Cardiac: No chest pain, orthopnea, or PND, lower leg swelling  GI: No abdominal pain, No nausea, vomiting or diarrhea  GU: No discharge, hematuria and dysuria  GYN: Menses normal; no vaginal discharge   NEURO: No neuropathic pain  MUSC: No myalgias or arthralgias  SKIN: No rash or  other skin problems  PSYCH: Denies depression or anxiety    PMH/PSH  Past Medical History:   Diagnosis Date    Pulmonary HTN (CMS-HCC)     Sjoegren syndrome (CMS-HCC) 07/12/2009     No past surgical history on file.  Patient Active Problem List    Diagnosis Date Noted    Edema 05/16/2017    Thrombocytopenia (CMS-HCC) 07/15/2016    PAH (pulmonary artery hypertension) (CMS-HCC) 05/27/2016    Volume overload 05/02/2016    Systemic lupus erythematosus (CMS-HCC) 10/08/2011    Moraxella catarrhalis pneumonia (CMS-HCC) 06/21/2011    Pneumonia 06/15/2011    Hypoxia 06/15/2011    Chronic right-sided congestive heart failure (CMS-HCC) 06/15/2011    Chest pain 06/07/2011    Bilateral edema of lower extremity 06/07/2011    Sjoegren syndrome 07/12/2009    PAH (PULMONARY ARTERY HYPERTENSION) 08/07/2005       FAMILY HISTORY  Family History   Problem Relation Name Age of Onset    Heart Disease Mother      Hypertension Mother      Psychiatry Mother      Heart Disease Father         SOCIAL HISTORY  Social History     Socioeconomic History    Marital  status: Married     Spouse name: Not on file    Number of children: Not on file    Years of education: Not on file    Highest education level: Not on file   Occupational History    Not on file   Social Needs    Financial resource strain: Not on file    Food insecurity:     Worry: Not on file     Inability: Not on file    Transportation needs:     Medical: Not on file     Non-medical: Not on file   Tobacco Use    Smoking status: Former Smoker     Packs/day: 0.50     Years: 5.00     Pack years: 2.50     Last attempt to quit: 01/13/1978     Years since quitting: 39.3    Smokeless tobacco: Never Used   Substance and Sexual Activity    Alcohol use: No    Drug use: Not on file    Sexual activity: Not on file   Lifestyle    Physical activity:     Days per week: Not on file     Minutes per session: Not on file    Stress: Not on file   Relationships    Social  connections:     Talks on phone: Not on file     Gets together: Not on file     Attends religious service: Not on file     Active member of club or organization: Not on file     Attends meetings of clubs or organizations: Not on file     Relationship status: Not on file    Intimate partner violence:     Fear of current or ex partner: Not on file     Emotionally abused: Not on file     Physically abused: Not on file     Forced sexual activity: Not on file   Other Topics Concern    Not on file   Social History Narrative    Not on file       MEDICATIONS/ALLERGIES  Allergies   Allergen Reactions    Allopurinol Hives    Levaquin Swelling and Other     Severe joint pain      Ofloxacin Nausea and Vomiting    Chlorhexidine Itching    Sulfa Drugs Unspecified     No current facility-administered medications on file prior to encounter.      Current Outpatient Medications on File Prior to Encounter   Medication Sig Dispense Refill    azelastine (ASTELIN) 0.1 % nasal spray Spray 1 spray into each nostril 2 times daily. Use in each nostril as directed 1 bottle 3    [DISCONTINUED] bumetanide (BUMEX) 2 MG tablet TAKE 1 TABLET BY MOUTH 3 TIMES DAILY. 90 each 0    Cholecalciferol 2000 UNITS CAPS Take 1 capsule by mouth daily.      [DISCONTINUED] colchicine 0.6 MG tablet TAKE 1 TABLET BY MOUTH DAILY. 30 each 0    digoxin (LANOXIN) 0.125 MG tablet Take 1 tablet (125 mcg) by mouth every evening. 30 tablet 3    Estradiol (DIVIGEL) 0.25 MG/0.25GM GEL Apply 0.25 mg topically nightly.      estradiol (ESTRING) 2 MG vaginal ring Insert 1 Ring vaginally Every 3 months. Follow package directions      ferrous sulfate 324 (65 FE) MG TBEC Take 1 tablet (324 mg) by mouth daily. [place]  tablet 0    FLOLAN 1.5 MG IV SOLR 96VE/LF/YBO      FOLIC ACID 175 MCG OR TABS 1 TABLET DAILY      furosemide (LASIX) 40 MG tablet Take 2.5 tablets (100 mg) by mouth 3 times daily. 200 tablet 3    medroxyPROGESTERone (PROVERA) 2.5 MG tablet Take 1.25 mg  by mouth daily.      potassium chloride (KLOR-CON) 10 MEQ tablet TAKE 4 TABLETS BY MOUTH DAILY. 120 each 0    sildenafil (REVATIO, VIAGRA) 20 MG tablet TAKE FOUR TABLETS BY MOUTH THREE TIMES DAILY 360 each 0    spironolactone (ALDACTONE) 50 MG tablet TAKE 1 TABLET BY MOUTH DAILY. 30 each 0    vitamin b-12 (CYANOCOBALAMIN) 500 MCG tablet TAKE 1 TABLET BY MOUTH DAILY. 30 each 3       ==================================================================  OBJECTIVE    VITALS  Vitals:    05/15/17 2245   BP: 129/61   Pulse: 90   Resp: 16   Temp: 99.2 F (37.3 C)   SpO2: 95%   Weight: 65.5 kg (144 lb 7 oz)   Height: _0  (1.575 m)     Temperature:  [99.2 F (37.3 C)] 99.2 F (37.3 C) (05/03 2245)  Blood pressure (BP): (129)/(61) 129/61 (05/03 2245)  Heart Rate:  [90] 90 (05/03 2245)  Respirations:  [16] 16 (05/03 2245)  Pain Score: 4 (05/03 2245)  O2 Device: None (Room air) (05/03 2245)  SpO2:  [95 %] 95 % (05/03 2245)    PHYSICAL EXAM  GEN: comfortable at rest, speaking in full sentences, no use of accessory muscles for respiration   CV: normal S1, S2, No loud/obvious murmurs  RESP: Good air entry b/l., no wheezes or crackles,   ABD: Soft, non tender, ascites +,  normal bowel sounds  NEURO: AOx3, moving all 4 extremities, following commands   Extremities: warm, 3+ leg edema.     Oxygentation: on  3L NC  Lines/tubes/drains:   Patient Lines/Drains/Airways Status    Active PICC Line / CVC Line / PIV Line / Drain / Airway / Intraosseous Line / Epidural Line / ART Line / Line Type / Wound     Name: Placement date: Placement time: Site: Days:    Peripheral IV - 22 G Left Forearm  05/15/17   2327   Forearm  less than 1                INPUT/OUTPUT  No intake or output data in the 24 hours ending 05/16/17 0047    WEIGHTS  Weights (last 14 days)     Date/Time Weight Weight Source Percentage Weight Change (%) Who    05/15/17 2245  65.5 kg (144 lb 7 oz)  Standing scale  0 % DF              HEMODYNAMICS       VENT AND ABGs            No results for input(s): ARTPH, ARTPO2, ARTPCO2 in the last 72 hours.  No results for input(s): PH, PO2, PCO2 in the last 72 hours.     LABS    Recent Labs     05/15/17  2324   WBC 5.9   HGB 9.5*   HCT 31.1*   MCV 80.2   PLT 52*   SEG 80   LYMPHS 14   MONOS 4   EOS 0       Recent Labs  05/15/17  2324   NA 136   K 4.2   CL 109*   BICARB 15*   BUN 81*   CREAT 1.94*   GLU 84   [place] 9.2   TP 7.0   ALB 3.5   TBILI 0.36   AST 35*   ALT 9   ALK 155*       No results for input(s): PTT, INR in the last 72 hours.     No results for input(s): CPK, CKMBH, TROPONIN in the last 72 hours.  Lab Results   Component Value Date    BNP 53 10/09/2008    BNPP 32,908 (H) 05/15/2017    BNPP 13,583 (H) 08/23/2016    BNPP 13,224 (H) 08/13/2016       No results for input(s): LACTATE in the last 72 hours.    UA  No results for input(s): COLORUA, APPEARUA, GLUCOSEUA, BILIUA, KETONEUA, SGUA, BLOODUA, PHUA, PROTEINUA, UROBILUA, NITRITEUA, LEUKESTUA, WBCUA, RBCUA, EPITHCELLSUA, HYALINEUA, CRYSTALSUA, COMMENTSUA in the last 72 hours.    Invalid input(s): PARASITEUA    PREVIOUS MICRO  Lab Results   Component Value Date    BLOODCULT No Growth after 5 day/s of incubation. 06/15/2011    BLOODCULT No Growth after 5 day/s of incubation. 06/15/2011    URINECULTURE (A) 06/11/2016     Staphylococcus aureus  >100,000 colonies/mL  Two colony types; same susceptibility pattern  Identification performed by [employer]( [facility]-ToF). This test  was developed and its performance characteristics determined by [facility]. It has not been cleared or  approved by the U.S. Food and Drug Administration.  The FDA has  determined that such clearance or approval is not necessary.     .  Results for orders placed or performed in visit on 12/27/03   HIV 1/2 ANTIBODY, BLOOD   Result Value Ref Range    HIV 1/2 Antibody & P24 Antigen Assay NonReactive      ultures      IMAGING AND  STUDIES      ==================================================================  ASSESSMENT AND PLAN  Diana Chambers is a 67 year old female with a PMH significant for [place] admitted to the hospital with RV failure.    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  PLAN  - iv lasix 80 tid  - continue flolan and sildenafil, digoxin  - Echo, cxr  - blood cultures    # low plt  Held DVT ppx    This patient was discussed with my attending physician, Dr. Cristal Deer. This note is not final until signed/attested by the attending physician    [name], MD  Fellow, [facility] [place]

## 2017-05-15 NOTE — ED Notes (Signed)
Pt is on Flolan.  - Charge RN aware  - Primary RN aware  - Attending MD aware

## 2017-05-15 NOTE — ED Notes (Signed)
[facility] team notified Sherian Rein, PVM, Anda Latina @[phone]).

## 2017-05-15 NOTE — Progress Notes (Signed)
Prostacyclin (Flolan/Veletri/Remodulin)      Prostacyclin MCP 333.1      Prostacyclin: Flolan - IV    Prostacyclin Dosing Weight: 86 kg     Dose: 35 ng/kg/min    Concentration:  60,000 ng/mL    Rate: Flolan/Veletri/Remodulin IV CADD:  72 mL/24hr    Last Changed: 05/15/17  57 mLs remaining at 2310 on 05/15/17  Changes every 1 days    Has 2 pumps in date?: yes  1). Serial # [id number] Due Date: 03/21/2018  2). Serial # [id number] Due Date: 11/11/2017  Pump Company: [place], [facility]

## 2017-05-16 ENCOUNTER — Emergency Department (HOSPITAL_COMMUNITY): Payer: PPO

## 2017-05-16 DIAGNOSIS — Z452 Encounter for adjustment and management of vascular access device: Secondary | ICD-10-CM

## 2017-05-16 DIAGNOSIS — I350 Nonrheumatic aortic (valve) stenosis: Secondary | ICD-10-CM

## 2017-05-16 DIAGNOSIS — I272 Pulmonary hypertension, unspecified: Secondary | ICD-10-CM

## 2017-05-16 DIAGNOSIS — R609 Edema, unspecified: Secondary | ICD-10-CM | POA: Diagnosis present

## 2017-05-16 DIAGNOSIS — I371 Nonrheumatic pulmonary valve insufficiency: Secondary | ICD-10-CM

## 2017-05-16 DIAGNOSIS — I2721 Secondary pulmonary arterial hypertension: Secondary | ICD-10-CM

## 2017-05-16 DIAGNOSIS — I517 Cardiomegaly: Secondary | ICD-10-CM

## 2017-05-16 DIAGNOSIS — I361 Nonrheumatic tricuspid (valve) insufficiency: Secondary | ICD-10-CM

## 2017-05-16 DIAGNOSIS — E8770 Fluid overload, unspecified: Secondary | ICD-10-CM

## 2017-05-16 DIAGNOSIS — J9611 Chronic respiratory failure with hypoxia: Secondary | ICD-10-CM

## 2017-05-16 DIAGNOSIS — I50813 Acute on chronic right heart failure: Principal | ICD-10-CM

## 2017-05-16 DIAGNOSIS — D696 Thrombocytopenia, unspecified: Secondary | ICD-10-CM

## 2017-05-16 LAB — CBC WITH DIFF, BLOOD
ANC-Manual Mode: 4.7 10*3/uL (ref 1.6–7.0)
ANC-Manual Mode: 4.3 10*3/uL (ref 1.6–7.0)
Abs Basophils: 0.1 10*3/uL (ref <0.1)
Abs Basophils: 0 10*3/uL (ref <0.1)
Abs Eosinophils: 0 10*3/uL (ref 0.1–0.5)
Abs Eosinophils: 0.1 10*3/uL (ref 0.1–0.5)
Abs Lymphs: 0.8 10*3/uL (ref 0.8–3.1)
Abs Lymphs: 1 10*3/uL (ref 0.8–3.1)
Abs Monos: 0.2 10*3/uL (ref 0.2–0.8)
Abs Monos: 0.2 10*3/uL (ref 0.2–0.8)
Basophils: 1 %
Basophils: 0 %
Eosinophils: 0 %
Eosinophils: 1 %
Hct: 31.1 % — ABNORMAL LOW (ref 34.0–45.0)
Hct: 32.1 % — ABNORMAL LOW (ref 34.0–45.0)
Hgb: 9.5 gm/dL — ABNORMAL LOW (ref 11.2–15.7)
Hgb: 9.7 gm/dL — ABNORMAL LOW (ref 11.2–15.7)
Lymphocytes: 14 %
Lymphocytes: 17 %
MCH: 24.5 pg — ABNORMAL LOW (ref 26.0–32.0)
MCH: 24.8 pg — ABNORMAL LOW (ref 26.0–32.0)
MCHC: 30.5 g/dL — ABNORMAL LOW (ref 32.0–36.0)
MCHC: 30.2 g/dL — ABNORMAL LOW (ref 32.0–36.0)
MCV: 80.2 um3 (ref 79.0–95.0)
MCV: 82.1 um3 (ref 79.0–95.0)
Monocytes: 4 %
Monocytes: 3 %
Plt Count: 52 10*3/uL — ABNORMAL LOW (ref 140–370)
Plt Count: 50 10*3/uL — ABNORMAL LOW (ref 140–370)
RBC: 3.88 10*6/uL — ABNORMAL LOW (ref 3.90–5.20)
RBC: 3.91 10*6/uL (ref 3.90–5.20)
RDW: 19.5 % — ABNORMAL HIGH (ref 12.0–14.0)
RDW: 19.8 % — ABNORMAL HIGH (ref 12.0–14.0)
Segs: 80 %
Segs: 75 %
WBC: 5.9 10*3/uL (ref 4.0–10.0)
WBC: 5.7 10*3/uL (ref 4.0–10.0)

## 2017-05-16 LAB — MDIFF
Immature Granulocytes Absolute Manual: 0.1 10*3/uL (ref 0.0–0.1)
Immature Granulocytes Absolute Manual: 0.2 10*3/uL — ABNORMAL HIGH (ref 0.0–0.1)
Metamyelocytes: 1 %
Myelocytes: 1 %
Myelocytes: 3 %
Number of Cells Counted: 114
Number of Cells Counted: 115
Plt Est: DECREASED
Plt Est: DECREASED

## 2017-05-16 LAB — BASIC METABOLIC PANEL, BLOOD
Anion Gap: 14 mmol/L (ref 7–15)
BUN: 77 mg/dL — ABNORMAL HIGH (ref 8–23)
Bicarbonate: 13 mmol/L — ABNORMAL LOW (ref 22–29)
Calcium: 9.1 mg/dL (ref 8.5–10.6)
Chloride: 111 mmol/L — ABNORMAL HIGH (ref 98–107)
Creatinine: 1.82 mg/dL — ABNORMAL HIGH (ref 0.51–0.95)
GFR: 28 mL/min
Glucose: 83 mg/dL (ref 70–99)
Potassium: 3.9 mmol/L (ref 3.5–5.1)
Sodium: 138 mmol/L (ref 136–145)

## 2017-05-16 LAB — COMPREHENSIVE METABOLIC PANEL, BLOOD
ALT (SGPT): 9 U/L (ref 0–33)
AST (SGOT): 35 U/L — ABNORMAL HIGH (ref 0–32)
Albumin: 3.5 g/dL (ref 3.5–5.2)
Alkaline Phos: 155 U/L — ABNORMAL HIGH (ref 35–140)
Anion Gap: 12 mmol/L (ref 7–15)
BUN: 81 mg/dL — ABNORMAL HIGH (ref 8–23)
Bicarbonate: 15 mmol/L — ABNORMAL LOW (ref 22–29)
Bilirubin, Tot: 0.36 mg/dL (ref <1.2)
Calcium: 9.2 mg/dL (ref 8.5–10.6)
Chloride: 109 mmol/L — ABNORMAL HIGH (ref 98–107)
Creatinine: 1.94 mg/dL — ABNORMAL HIGH (ref 0.51–0.95)
GFR: 26 mL/min
Glucose: 84 mg/dL (ref 70–99)
Potassium: 4.2 mmol/L (ref 3.5–5.1)
Sodium: 136 mmol/L (ref 136–145)
Total Protein: 7 g/dL (ref 6.0–8.0)

## 2017-05-16 LAB — PRO BNP, BLOOD: BNPP: 32908 pg/mL — ABNORMAL HIGH (ref 0–899)

## 2017-05-16 LAB — 2D ECHO WITH IMAGE ENHANCEMENT AGENT IF NECESSARY
AO Mean Gradient: 13 mmHg
Aortic Valve Area: 1.59 cm2
IVC Diameter: 2.21 cm
LA Volume Index: 21.6 ml/m²
LV Ejection Fraction: 68 %
PA Pressure: 100 mmHg

## 2017-05-16 LAB — DIGOXIN, BLOOD: Digoxin: 1.4 ng/mL — ABNORMAL HIGH (ref 0.6–1.2)

## 2017-05-16 MED ORDER — SODIUM CHLORIDE 0.9% TKO INFUSION
INTRAVENOUS | Status: DC | PRN
Start: 2017-05-16 — End: 2017-07-15

## 2017-05-16 MED ORDER — SODIUM CHLORIDE 0.9 % IJ SOLN (CUSTOM)
3.0000 mL | Freq: Three times a day (TID) | INTRAMUSCULAR | Status: DC
Start: 2017-05-16 — End: 2017-07-15
  Administered 2017-05-16 – 2017-07-13 (×97): 3 mL via INTRAVENOUS

## 2017-05-16 MED ORDER — PROSTACYCLIN ICE PACK (~~LOC~~)
Freq: Four times a day (QID) | Status: DC
Start: 2017-05-16 — End: 2017-05-23
  Administered 2017-05-16 – 2017-05-18 (×8)
  Administered 2017-05-18: 2
  Administered 2017-05-18
  Administered 2017-05-18 – 2017-05-19 (×5): 2
  Administered 2017-05-20
  Administered 2017-05-20: 2
  Administered 2017-05-20 – 2017-05-22 (×8)
  Filled 2017-05-16: qty 1

## 2017-05-16 MED ORDER — FUROSEMIDE 10 MG/ML IJ SOLN
80.00 mg | Freq: Three times a day (TID) | INTRAMUSCULAR | Status: DC
Start: 2017-05-16 — End: 2017-05-17
  Administered 2017-05-16 – 2017-05-17 (×5): 80 mg via INTRAVENOUS
  Filled 2017-05-16 (×5): qty 8

## 2017-05-16 MED ORDER — DIGOXIN 0.125 MG OR TABS
67.50 ug | ORAL_TABLET | Freq: Every evening | ORAL | Status: DC
Start: 2017-05-16 — End: 2017-05-18
  Administered 2017-05-16 – 2017-05-17 (×2): 62.5 ug via ORAL
  Filled 2017-05-16 (×2): qty 1

## 2017-05-16 MED ORDER — SPIRONOLACTONE 25 MG OR TABS
75.00 mg | ORAL_TABLET | Freq: Every day | ORAL | Status: DC
Start: 2017-05-17 — End: 2017-06-29
  Administered 2017-05-17 – 2017-06-29 (×44): 75 mg via ORAL
  Filled 2017-05-16 (×44): qty 3

## 2017-05-16 MED ORDER — MEDROXYPROGESTERONE ACETATE 2.5 MG OR TABS
1.25 mg | ORAL_TABLET | ORAL | Status: DC
Start: 2017-05-16 — End: 2017-07-15
  Administered 2017-05-16 – 2017-07-14 (×60): 1.25 mg via ORAL
  Filled 2017-05-16 (×63): qty 1

## 2017-05-16 MED ORDER — SPIRONOLACTONE 25 MG OR TABS
50.00 mg | ORAL_TABLET | Freq: Every day | ORAL | Status: DC
Start: 2017-05-16 — End: 2017-05-16
  Administered 2017-05-16: 50 mg via ORAL
  Filled 2017-05-16: qty 2

## 2017-05-16 MED ORDER — MEDROXYPROGESTERONE ACETATE 2.5 MG OR TABS
1.25 mg | ORAL_TABLET | Freq: Every day | ORAL | Status: DC
Start: 2017-05-16 — End: 2017-05-16
  Filled 2017-05-16: qty 1

## 2017-05-16 MED ORDER — SODIUM CHLORIDE 0.9 % IJ SOLN (CUSTOM)
3.0000 mL | INTRAMUSCULAR | Status: DC | PRN
Start: 2017-05-16 — End: 2017-07-15
  Administered 2017-05-23 – 2017-07-02 (×9): 3 mL via INTRAVENOUS

## 2017-05-16 MED ORDER — PROSTACYCLIN CASSETTE CHANGE (~~LOC~~)
Freq: Every day | Status: DC
Start: 2017-05-16 — End: 2017-07-15
  Administered 2017-05-16 – 2017-06-28 (×44)
  Administered 2017-06-29: 1
  Administered 2017-06-30 – 2017-07-13 (×14)
  Administered 2017-07-14: 1
  Administered 2017-07-15: 14:00:00
  Filled 2017-05-16: qty 1

## 2017-05-16 MED ORDER — PROSTACYCLIN TUBING (~~LOC~~)
Status: DC
Start: 2017-05-16 — End: 2017-07-15
  Administered 2017-05-16 – 2017-06-26 (×19)
  Administered 2017-06-29: 1
  Administered 2017-07-01 – 2017-07-15 (×7)
  Filled 2017-05-16: qty 1

## 2017-05-16 MED ORDER — HEPARIN SODIUM (PORCINE) 5000 UNIT/ML IJ SOLN
5000.0000 [IU] | Freq: Two times a day (BID) | INTRAMUSCULAR | Status: DC
Start: 2017-05-16 — End: 2017-05-16

## 2017-05-16 MED ORDER — GLYCINE DILUENT IV SOLN
35.0000 ng/kg/min | INTRAVENOUS | Status: DC
Start: 2017-05-16 — End: 2017-07-15
  Administered 2017-05-16 – 2017-07-15 (×153): 35 ng/kg/min via INTRAVENOUS
  Filled 2017-05-16 (×64): qty 6

## 2017-05-16 MED ORDER — NALOXONE HCL 0.4 MG/ML IJ SOLN
0.1000 mg | INTRAMUSCULAR | Status: DC | PRN
Start: 2017-05-16 — End: 2017-07-15

## 2017-05-16 MED ORDER — LOPERAMIDE HCL 2 MG OR CAPS
2.00 mg | ORAL_CAPSULE | Freq: Three times a day (TID) | ORAL | Status: DC | PRN
Start: 2017-05-16 — End: 2017-06-23
  Administered 2017-05-16 – 2017-06-20 (×69): 2 mg via ORAL
  Filled 2017-05-16 (×73): qty 1

## 2017-05-16 MED ORDER — PROSTACYCLIN BATTERIES - PUMP (~~LOC~~)
Status: DC
Start: 2017-05-18 — End: 2017-07-15
  Administered 2017-05-18 – 2017-06-22 (×6)
  Administered 2017-06-29: 4
  Administered 2017-07-06 – 2017-07-13 (×2)
  Filled 2017-05-16: qty 1

## 2017-05-16 MED ORDER — SILDENAFIL CITRATE 20 MG OR TABS
80.00 mg | ORAL_TABLET | Freq: Three times a day (TID) | ORAL | Status: DC
Start: 2017-05-16 — End: 2017-07-15
  Administered 2017-05-16 – 2017-07-15 (×183): 80 mg via ORAL
  Filled 2017-05-16 (×2): qty 4

## 2017-05-16 MED ORDER — DIGOXIN 0.125 MG OR TABS
125.00 ug | ORAL_TABLET | Freq: Every evening | ORAL | Status: DC
Start: 2017-05-16 — End: 2017-05-16

## 2017-05-16 NOTE — ED Floor Report (Signed)
ED to IP Handoff    Report created by Baruch Merl, RN at 2:08 AM 05/16/2017.     HANDOFF REPORT UPDATE/CHANGES (changes in patient status/care/events prior to transfer)  By who:  Time:   Additional information:                                                                                                                                                     Diana Chambers is a 67 year old female.    Brief Summary of ED Visit (to include focused assessment and neuro status):    Pt came in from [place] with husband with c/o worsening bilateral LE edema  Per pt, was admitted in the hospital previously for diarrhea; diarrhea resolved since 3 days ago  States she was taking a certain medication for it and believes it caused her worsening BLE edema     Pt hx of pulmonary HTN, on Flolan  Pulm and Pharm aware and spoke to pt at the bedside    AOX4, ambulatory  In NAD  V/S wnl, afebrile, placed on 3l/min NC oxygen as she has this at home    RN shift assessment exceptions to WDL: redness and multiple bruising noted on chest and bilateral arms, rounded and enlarged abd- without distention and tenderness, soft to touch, on home oxygen 3l/min NC    Any significant events and interventions with responses:  Lasix 80mg  IVP    Radiologic studies not completed: n/a  (None unless otherwise noted)    Chief Complaint   Patient presents with    Edema     C/O SWELLING TO BOTH FEET FOR THE LAST 5 DAYS.  C/O ABD BLOATING.  DC'D FROM A HOSP IN [place] FOR DIARRHEA.  TAKEN OFF LASIX FOR THE LAST 5 DAYS.  DROVE [name].  PT IS A FLOLAN PT.       Admitted for: EDEMA    Code Status:  Please refer to In-pt admitting doctors orders     Level of Care: MS TELE     Is patient septic? no If yes, complete below:    BC x 2 drawn? yes  If No explain:      Repeat lactate needed? no  If Yes, when is it due?  n/a    All initial antibiotics given?  no  If No, explain:  n/a    Amount of IV fluids received 0 ml    Is patient on  Heparin? no If yes, complete below:     Time Heparin bolus was given:     Additional drips patient is on:     Cardiac rhythm: NSR    Oxygen Delivery: Nasal Cannula 3 L/Min    Past Medical History:   Diagnosis Date    Pulmonary HTN (CMS-HCC)     Sjoegren  syndrome (CMS-HCC) 07/12/2009       No past surgical history on file.    Allergies: Allopurinol; Levaquin; Ofloxacin; Chlorhexidine; and Sulfa drugs    ED Fall Risk: (S) (!) Yes    Skin issues:  Yes, redness and multiple bruising noted on chest and BUE   >> If yes, note areas of skin breakdown. See appropriate photos.      Ambulatory:  yes    Sitter needed: no    Suicide Risk:  no    Isolation Required: no     >> If yes , what type of isolation:     Is patient in custody?  no    Is patient in restraints? no    Vitals:    05/15/17 2245 05/16/17 0055 05/16/17 0106   BP: 129/61 128/64    Pulse: 90 92    Resp: 16     Temp: 99.2 F (37.3 C)     SpO2: 95%  98%   Weight: 65.5 kg (144 lb 7 oz)     Height: 5\' 2"  (1.575 m)              Lab Results   Component Value Date    WBC 5.9 05/15/2017    RBC 3.88 (L) 05/15/2017    HGB 9.5 (L) 05/15/2017    HCT 31.1 (L) 05/15/2017    MCV 80.2 05/15/2017    MCHC 30.5 (L) 05/15/2017    RDW 19.5 (H) 05/15/2017    PLT 52 (L) 05/15/2017       Lab Results   Component Value Date    NA 136 05/15/2017    K 4.2 05/15/2017    CL 109 (H) 05/15/2017    BICARB 15 (L) 05/15/2017    BUN 81 (H) 05/15/2017    CREAT 1.94 (H) 05/15/2017    GLU 84 05/15/2017    [place] 9.2 05/15/2017       No results found for: BNP, PHOS, MG, LACTATE, AMMONIA, IONCA, ARTIONCA    No results found for: CPK, CKMBH, TROPONIN    No results found for: PH, PCO2, O2CONTENT, IVHC3, IVBE, O2SAT, UNPH, UNPCO2, ARTPH, ARTPCO2, ARTO2CNT, IAHC3, IABE, ARTO2SAT, UNAPH, UNAPCO2    No results found for this visit on 05/15/17.      Patient Lines/Drains/Airways Status    Active PICC Line / CVC Line / PIV Line / Drain / Airway / Intraosseous Line / Epidural Line / ART Line / Line Type / Wound      Name: Placement date: Placement time: Site: Days:    Peripheral IV - 22 G Left Forearm  05/15/17   2327   Forearm  less than 1                    ED Handoff Report is ready for review.  Admitting RN may reach Emergency Department RN, [name], RN, at [phone] with any questions.

## 2017-05-16 NOTE — Plan of Care (Addendum)
Problem: Promotion of Health and Safety  Goal: Promotion of Health and Safety  The patient remains safe, receives appropriate treatment and achieves optimal outcomes (physically, psychosocially, and spiritually) within the limitations of the disease process by discharge.    Information below is the current care plan.  Outcome: Progressing   05/16/17 0315   Patient /Family stated Goal   Patient /Family stated Goal feel better   Adult/Peds Plan of Care   Individualized Interventions/Recommendations #1 monitor hemodynamics   Individualized Interventions/Recommendations #2 (if applicable) flolan gtt settings/ parameters   Individualized Interventions/Recommendations #3 (if applicable) daily wt/ strict I/O's, IV diuretics given   Individualized Interventions/Recommendations #4 (if applicable) assist in ADL's       Comments: Pt received from ED at 0245 AM, noted with Hx pulmo HTN, admitted for swelling BLE +3-4, ongoing home flolan gtt noted, MD text paged for orders, awaiting med orders, tele SR, pt afebrile, noted uses 3L NC, O2 sat at 96-98%, AM labs sent, noted ED given IV lasix, and blood culture drawn, to continue to monitor

## 2017-05-16 NOTE — ED Notes (Signed)
Pt report given to Carloyn Manner RN of 4B, admitting nurse  All questions answered

## 2017-05-16 NOTE — Progress Notes (Addendum)
PVM DAILY PROGRESS NOTE     DOS: 05/16/17     Current Hospital Stay:   0 days - Admitted on: 05/15/2017    Subjective:  67 year old F with 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 failure on 3Lpm now here with worsening LEx edema. Notes that she was in [place] and had some sort of gastroenteritis. She had recurrent D and N/V and ended up in a local hospital. Her diuretics were held and she improved but discharge off her diuretics. Soon after d/c she started noticing edema of her LEx, and she resumed her PO lasix, but without any effect. She also noted some mild worsening of her DOE.     Overnight events: She was started on IV diuretics after admission and feels like her edema seems to be improving, albeit still present. No issues with her tunneled CVC site and no other Sx currently. Has mild AKI on labs and acute on chronic thrombocytopenia.     Review of Systems:   No frank SOB at rest, + slightly worse DOE, + edema, no abdominal distention.  All other systems were reviewed and are negative except as noted above    Objective:    Current Medications:   digoxin  62.5 mcg QPM    furosemide  80 mg TID    medroxyPROGESTERone  1.25 mg Daily    Prostacyclin Cassette Change   Daily    Prostacyclin Ice Pack   Q6H    Prostacyclin Tubing   Once per day on Mon Wed Fri    [START ON 05/18/2017] Prostacylin Batteries   Once per day on Mon    sildenafil  80 mg TID    sodium chloride (PF)  3 mL Q8H    spironolactone  50 mg Daily      epoprostenol (FLOLAN) infusion 35 ng/kg/min (05/16/17 0431)    sodium chloride        loperamide  2 mg TID PRN    nalOXone  0.1 mg Q2 Min PRN    sodium chloride (PF)  3 mL PRN    sodium chloride   Continuous PRN       Vital Signs:  Temperature:  [98.1 F (36.7 C)-99.2 F (37.3 C)] 98.1 F (36.7 C) (05/04 1307)  Blood pressure (BP): (108-129)/(61-77) 108/61 (05/04 1307)  Heart Rate:  [89-98] 93 (05/04 1307)  Respirations:  [16-23] 18 (05/04  1307)  Pain Score: 0 (05/04 1307)  O2 Device: Nasal cannula (05/04 1307)  O2 Flow Rate (L/min):  [3 l/min] 3 l/min (05/04 1307)  SpO2:  [93 %-98 %] 93 % (05/04 1307)  Wt Readings from Last 1 Encounters:   05/16/17 64.4 kg (141 lb 14.4 oz)       Intake/Output (Current Shift):  05/03 0600 - 05/04 0559  In: 54.4 [P.O.:50; I.V.:4.4]  Out: 400 [Urine:400]    Physical Exam:  Gen: A&O, conversing comfortably  Eyes: EOMI, pupils equal, anicteric  HENT: Atraumatic, normocephalic  Neck: + JVD, + HJR, trachea midline  CV: RRR, + M, no R/G/S3. Tunneled CVC site is CDI  Resp: Faint bibasal crackles, no intercostal retractions/normal effort  Abdo: NT, +D, soft, no masses.  Ext: +2 BLEx edema to knees, possible clubbing and some chronic nail changes, no cyanosis; no joint abnormalities  Neuro: No focal weakness or sensory deficit, CN grossly intact  Psych: Appropriate affect and mood, good insight. AnOx3  Skin: Normal temperature, no rash or nodules; ++  bruising      Diagnostic Data:  Laboratory data:   Lab Results   Component Value Date    NA 138 05/16/2017    K 3.9 05/16/2017    CL 111 (H) 05/16/2017    BICARB 13 (L) 05/16/2017    BUN 77 (H) 05/16/2017    CREAT 1.82 (H) 05/16/2017    GLU 83 05/16/2017    [place] 9.1 05/16/2017     Lab Results   Component Value Date    WBC 5.7 05/16/2017    HGB 9.7 (L) 05/16/2017    HCT 32.1 (L) 05/16/2017    PLT 50 (L) 05/16/2017    SEG 75 05/16/2017    LYMPHS 17 05/16/2017    MONOS 3 05/16/2017    EOS 1 05/16/2017     Lab Results   Component Value Date    AST 35 (H) 05/15/2017    ALT 9 05/15/2017    ALK 155 (H) 05/15/2017    TBILI 0.36 05/15/2017    TP 7.0 05/15/2017    ALB 3.5 05/15/2017       Imaging studies:  CXR from 05/16/17  Cardiomegaly and large L PA, tunneled CVC in place. Elevated R hemiD. No major change from prior    Assessment and Plan:  66 year old F with 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 failure on 3Lpm now here with worsening LEx edema.  1.  Acute on chronic RV CHF with volume overload:  - In the setting of recent GI issues adns topping diuretics  - 19m IV Lasix TID; continue aldactone  - Can increase to 1011mIV if not responding  - Improving Sx and lower Cr  - Follow UO and Cr  2. PAH:  - See above for diuresis plan  - Continue PAH Rx for now without changes  3. AKI:  - Acute on mild CKD  - Improving Cr  4. Thrombocytopenia:  - Acute on chronic  - Follow CBC  - Likely in setting of recent infection  5. Chronic hypoxemic resp failure:  - Continue home O2    Sereniti Wan, MD  Pulmonary and Critical Care  PID [id number] Pager [phone]

## 2017-05-16 NOTE — Plan of Care (Signed)
Problem: Promotion of Health and Safety  Goal: Promotion of Health and Safety  The patient remains safe, receives appropriate treatment and achieves optimal outcomes (physically, psychosocially, and spiritually) within the limitations of the disease process by discharge.    Information below is the current care plan.  Outcome: Progressing   05/16/17 0315 05/16/17 0920 05/16/17 1823   Patient /Family stated Goal   Patient /Family stated Goal --  get this fluid to go down  --    Adult/Peds Plan of Care   Guidelines --  --  --    Individualized Interventions/Recommendations #1 monitor hemodynamics --  --    Individualized Interventions/Recommendations #4 (if applicable) --  --  monitor I&O's    Outcome Evaluation (rationale for progressing/not progressing) every shift --  --  --     05/16/17 1854   Patient /Family stated Goal   Patient /Family stated Goal --    Adult/Peds Plan of Care   Guidelines Inpatient Nursing Guidelines   Individualized Interventions/Recommendations #1 --    Individualized Interventions/Recommendations #4 (if applicable) --    Outcome Evaluation (rationale for progressing/not progressing) every shift continuing to diureses pt and help with ambualtion to bathroom. tolerating flolan.

## 2017-05-16 NOTE — ED Notes (Signed)
Pt ready to be transported, awaiting for tech for transport

## 2017-05-16 NOTE — Plan of Care (Signed)
Problem: Promotion of Health and Safety  Goal: Promotion of Health and Safety  The patient remains safe, receives appropriate treatment and achieves optimal outcomes (physically, psychosocially, and spiritually) within the limitations of the disease process by discharge.    Information below is the current care plan.  Outcome: Progressing

## 2017-05-16 NOTE — ED Provider Notes (Signed)
Emergency Department Note  [facility] electronic medical record reviewed for pertinent medical history.     Nursing Triage Note:   Chief Complaint   Patient presents with    Edema     C/O SWELLING TO BOTH FEET FOR THE LAST 5 DAYS.  C/O ABD BLOATING.  DC'D FROM A HOSP IN [place] FOR DIARRHEA.  TAKEN OFF LASIX FOR THE LAST 5 DAYS.  DROVE [place].  PT IS A FLOLAN PT.       HPI:   67 year old female with a PMH significant for pulmonary htn on flolan, presents today for swelling. patinet was in [place] and was admitted to the hospital for diarrhea. Patient was stopped off of her diuretics, lasix, spironolactone at the time. Patient started developing swelling to the legs. Patient after a few days restarted her diuretics, however continued to have swelling. Patient denies any chest pain. Did have SOB when she was taking medications for the diarrhea, however SOB resolved after sopping the medications. Now taking imodium and no longer has diarrhea. Is on 3L at home normally at baseline, no changes.   HPI    Past Medical History:   Diagnosis Date    Pulmonary HTN (CMS-HCC)     Sjoegren syndrome (CMS-HCC) 07/12/2009       No past surgical history on file.    Family History:      Family History   Problem Relation Name Age of Onset    Heart Disease Mother      Hypertension Mother      Psychiatry Mother      Heart Disease Father         Social History:      Social History     Tobacco Use    Smoking status: Former Smoker     Packs/day: 0.50     Years: 5.00     Pack years: 2.50     Last attempt to quit: 01/13/1978     Years since quitting: 39.3    Smokeless tobacco: Never Used   Substance Use Topics    Alcohol use: No    Drug use: Not on file       Medications:   Prior to Admission Medications   Prescriptions Last Dose Informant Patient Reported? Taking?   Cholecalciferol 2000 UNITS CAPS   Yes No   Sig: Take 1 capsule by mouth daily.   Estradiol (DIVIGEL) 0.25 MG/0.25GM GEL   Yes No   Sig: Apply 0.25 mg topically  nightly.   FLOLAN 1.5 MG IV SOLR   Yes No   Sig: 35TD/DU/KGU   FOLIC ACID 542 MCG OR TABS   Yes No   Sig: 1 TABLET DAILY   azelastine (ASTELIN) 0.1 % nasal spray   No No   Sig: Spray 1 spray into each nostril 2 times daily. Use in each nostril as directed   digoxin (LANOXIN) 0.125 MG tablet   No No   Sig: Take 1 tablet (125 mcg) by mouth every evening.   estradiol (ESTRING) 2 MG vaginal ring   Yes No   Sig: Insert 1 Ring vaginally Every 3 months. Follow package directions   ferrous sulfate 324 (65 FE) MG TBEC   No No   Sig: Take 1 tablet (324 mg) by mouth daily.   furosemide (LASIX) 40 MG tablet   No No   Sig: Take 2.5 tablets (100 mg) by mouth 3 times daily.   medroxyPROGESTERone (PROVERA) 2.5 MG tablet  Yes No   Sig: Take 1.25 mg by mouth daily.   potassium chloride (KLOR-CON) 10 MEQ tablet   No No   Sig: TAKE 4 TABLETS BY MOUTH DAILY.   sildenafil (REVATIO, VIAGRA) 20 MG tablet   No No   Sig: TAKE FOUR TABLETS BY MOUTH THREE TIMES DAILY   spironolactone (ALDACTONE) 50 MG tablet   No No   Sig: TAKE 1 TABLET BY MOUTH DAILY.   vitamin b-12 (CYANOCOBALAMIN) 500 MCG tablet   No No   Sig: TAKE 1 TABLET BY MOUTH DAILY.      Facility-Administered Medications: None       Allergies: Allopurinol; Levaquin; Ofloxacin; Chlorhexidine; and Sulfa drugs    Review of Systems:   Review of Systems   Constitutional: Negative for chills and fever.   HENT: Negative for congestion and sore throat.    Eyes: Negative.    Respiratory: Negative for cough and shortness of breath.    Cardiovascular: Negative for chest pain and leg swelling.   Gastrointestinal: abd swelling. Negative for abdominal pain, constipation, diarrhea, nausea and vomiting.   Genitourinary: Negative for dysuria and hematuria.   Musculoskeletal: leg swelling  Skin: Negative for rash.   Neurological: Negative for light-headedness and headaches.   Psychiatric/Behavioral: Negative.      All other systems reviewed and negative unless otherwise noted in the HPI or above.  This was done per my custom and practice for systems appropriate to the chief complaint in an emergency department setting and varies depending on the quality of history that the patient is able to provide.      Physical Exam:   05/15/17  2245   BP: 129/61   Pulse: 90   Resp: 16   Temp: 99.2 F (37.3 C)   SpO2: 95%     Nursing note and vitals reviewed.     Physical Exam   Constitutional: Oriented to person, place, and time. Appears well-developed and well-nourished.   HENT:   Head: Normocephalic and atraumatic.   Eyes: EOM are normal. Pupils are equal, round, and reactive to light.   Neck: Normal range of motion. Neck supple.   Cardiovascular: Normal rate, regular rhythm, normal heart sounds and intact distal pulses. Exam reveals no gallop and no friction rub.   Pulmonary/Chest: Effort normal and breath sounds normal. No respiratory distress. No wheezes, no rales.   Abdominal: Soft. Bowel sounds are normal. + distension. There is no tenderness.   Musculoskeletal: Normal range of motion. 2+ pitting edema bilaterally lower extremities  Neurological: Alert and oriented to person, place, and time. No cranial nerve deficit.   Skin: Skin is warm and dry.   Psychiatric: Normal mood and affect. Behavior is normal. Judgment and thought content normal.       Workup Review:     Orders Placed This Encounter   Procedures    X-Ray Chest Single View    Comprehensive Metabolic Panel Green    CBC w/ Diff Lavender    Pro Bnp, Blood Green Plasma Separator Tube    Digoxin, Blood - See Instructions    Basic Metabolic Panel, Blood Green Plasma Separator Tube    CBC w/ Diff Lavender    MDIFF    Complete 2D ECHO with Image Enhancement Agent if Necessary         Impression & Initial ED Plan:  67 year old  female presents with edema, most likely due to stopping of her diuretics when she was admitted for diarrhea. Patient appears fluid  overloaded. Patient currently sating well on 3L, which is her baseline. Patient will need IV  diuresis for her fluid overload. Pump is working well on flolan. Diarrhea resolved. Cr mildly elevated from baseline. No other complaints. Admit to pulmonary.       The rest of the ED course, results, and plan for the patient is in a separate continuation note. Please see that note for details.       I have discussed my evaluation and care plan for the patient with the attending physician Dr. Deon Pilling.     Eldridge Abrahams, MD  Resident  05/16/17 3543       Pamala Duffel, MD  05/16/17 [phone]

## 2017-05-16 NOTE — Interdisciplinary (Signed)
05/16/17 0830   Initial Assessment   CM Initial Assessment Completed   Patient Information   Where was the patient admitted from? Home   Prior to Level of Function Ambulatory/Independent with ADL's   Assistive Device Other (Comment)  (Home O2 supplied by [employer])   Prior [employer] None   Primary Caretaker(s) Alone   Primary Contact Name, Number and Relationship Jamila Slatten [phone] cell and [phone] home   Permission to Contact Yes   Secondary Contact Name, Number and Relationship None   Conservator/Public Guardian Name and [place] Other (Comment)  (Disabled)   Pensions consultant Other (Comment)  ([employer] [place] MDCR PPO)   [place] Affiliation (Unknown)   Discharge [place] Other   Available Assistance/Support System Case manager/social worker;Spouse / significant other   Type of Residence Private Residence;[place] No   Additional Services Not Applicable   Barriers to Discharge Awaiting clinical improvement   Patient/Family Engaged in Discharge Planning Yes   Patient Has Decision Making Capacity Yes   Patient/Family/Legal/Surrogate Decision Maker Has Been Given Options And Choice In The Selection of Post-Acute Care Providers Not Applicable  (Pending d/c needs)   Legal Designee/Surrogate Name/Relationship/Contact Info None   Family/Caregiver's Assessed for Readiness, willingness, and ability to provide or support self-management activities;Readiness to provide care to the patient   Respite Care Not Applicable   Patient/Family Are In Agreement With Discharge Plan Pending d/c needs   Public Health Clearance Needed No   Social Worker Consult   Do you need to see a Education officer, museum?  No   Readmission Risk Assessment   Readmission Within 30 Days of Discharge No   Admission Was Unplanned   Patient Explanation  Got sicker   Family Explanation Got  sicker   Additional Information On 30 Day Readmission None   Recent Hospitalizations (Within Last 6 Months) No   High Risk For Readmission No   Recommendations to the Physician Home health orders;Therapy orders;Dietary evaluation;Case management consult   MOON   MOON Provided to Patient Not Applicable     Attempted to assess pt at the bedside but pt unavailable-not in room.  Pt known to CM from previous admission to [facility] on 07/15/2016.  Initial CM assessment performed per chart review and previous CM notes.  Pt admitted on 05/15/2017 for:      ICD-10-CM ICD-9-CM    1. Edema, unspecified type R60.9 782.3    2. PAH (pulmonary artery hypertension) (CMS-HCC) I27.21 416.8    3. Hypervolemia, unspecified hypervolemia type E87.70 276.69      All information on facesheet verified as correct.    Lives w/spouse in [facility] located in [place].  I-ADLs and denies ambulatory DME use.  Uses home O2 supplied by [employer] and on home flolan gtt provided by [employer].  No Hx of SNF or HHC.  PCP: Dr. Emeterio Reeve.  Ins: [employer] [place] MDCR PPO.  LACE: 73. LOC: Inpatient.    Barriers to d/c: clinical improvement.  Transportation: spouse.  DCP: Home w/no anticipated d/c needs.  Pt, spouse, and care team aware and agree with current d/c plan.  CM to continue to follow for additional needs, changes with d/c plan, and obtain physical home address.  Initial UR to be completed by weekend UR CM.    Ria Clock MSN/RN/CNL/PHN  Inpatient care manager  [phone]

## 2017-05-16 NOTE — ED Notes (Signed)
Pharmacist at the bedside.

## 2017-05-16 NOTE — ED Notes (Signed)
Pulmonologist at the bedside

## 2017-05-17 DIAGNOSIS — R0902 Hypoxemia: Secondary | ICD-10-CM

## 2017-05-17 LAB — CBC WITH DIFF, BLOOD
ANC-Manual Mode: 3.5 10*3/uL (ref 1.6–7.0)
Abs Basophils: 0 10*3/uL (ref <0.1)
Abs Eosinophils: 0.1 10*3/uL (ref 0.1–0.5)
Abs Lymphs: 0.9 10*3/uL (ref 0.8–3.1)
Abs Monos: 0.2 10*3/uL (ref 0.2–0.8)
Basophils: 0 %
Eosinophils: 3 %
Hct: 30.9 % — ABNORMAL LOW (ref 34.0–45.0)
Hgb: 9.6 gm/dL — ABNORMAL LOW (ref 11.2–15.7)
Lymphocytes: 19 %
MCH: 25.1 pg — ABNORMAL LOW (ref 26.0–32.0)
MCHC: 31.1 g/dL — ABNORMAL LOW (ref 32.0–36.0)
MCV: 80.7 um3 (ref 79.0–95.0)
Monocytes: 4 %
Plt Count: 52 10*3/uL — ABNORMAL LOW (ref 140–370)
RBC: 3.83 10*6/uL — ABNORMAL LOW (ref 3.90–5.20)
RDW: 19.9 % — ABNORMAL HIGH (ref 12.0–14.0)
Segs: 67 %
WBC: 4.8 10*3/uL (ref 4.0–10.0)

## 2017-05-17 LAB — BASIC METABOLIC PANEL, BLOOD
Anion Gap: 12 mmol/L (ref 7–15)
BUN: 79 mg/dL — ABNORMAL HIGH (ref 8–23)
Bicarbonate: 17 mmol/L — ABNORMAL LOW (ref 22–29)
Calcium: 8.8 mg/dL (ref 8.5–10.6)
Chloride: 108 mmol/L — ABNORMAL HIGH (ref 98–107)
Creatinine: 2.13 mg/dL — ABNORMAL HIGH (ref 0.51–0.95)
GFR: 23 mL/min
Glucose: 85 mg/dL (ref 70–99)
Potassium: 4 mmol/L (ref 3.5–5.1)
Sodium: 137 mmol/L (ref 136–145)

## 2017-05-17 LAB — MDIFF
Bands: 5 % (ref 0–15)
Immature Granulocytes Absolute Manual: 0.1 10*3/uL (ref 0.0–0.1)
Myelocytes: 2 %
Number of Cells Counted: 112
Plt Est: DECREASED

## 2017-05-17 LAB — MRSA SURVEILLANCE CULTURE

## 2017-05-17 MED ORDER — FUROSEMIDE 10 MG/ML IJ SOLN
100.00 mg | Freq: Three times a day (TID) | INTRAMUSCULAR | Status: DC
Start: 2017-05-17 — End: 2017-05-18
  Administered 2017-05-17 – 2017-05-18 (×3): 100 mg via INTRAVENOUS
  Filled 2017-05-17 (×3): qty 10

## 2017-05-17 NOTE — Plan of Care (Addendum)
Problem: Promotion of Health and Safety  Goal: Promotion of Health and Safety  The patient remains safe, receives appropriate treatment and achieves optimal outcomes (physically, psychosocially, and spiritually) within the limitations of the disease process by discharge.    Information below is the current care plan.  Outcome: Progressing   05/17/17 1943 05/17/17 2234   Patient /Family stated Goal   Patient /Family stated Goal "Walk, get rid of extra fluid, weigh less tomorrow." --    Adult/Peds Plan of Care   Guidelines --  Inpatient Nursing Guidelines   Individualized Interventions/Recommendations #1 --  Monitor strict intake and output, obtain weight while diuresing.    Individualized Interventions/Recommendations #2 (if applicable) --  Monitor flolan drip settings and parameters.    Individualized Interventions/Recommendations #3 (if applicable) --  Encourage and assist with ambulation.   Outcome Evaluation (rationale for progressing/not progressing) every shift --  Patient assisted with ambulation in halls. Legs wrapped with ace wraps and elevated on pillows when in bed. Strict intake and output monitored, weight obtained. Flolan drip settings correct as ordered, ice changed every 6 hours.

## 2017-05-17 NOTE — Plan of Care (Signed)
Problem: Promotion of Health and Safety  Goal: Promotion of Health and Safety  The patient remains safe, receives appropriate treatment and achieves optimal outcomes (physically, psychosocially, and spiritually) within the limitations of the disease process by discharge.    Information below is the current care plan.  Outcome: Progressing   05/16/17 1823 05/17/17 0317 05/17/17 0850   Patient /Family stated Goal   Patient /Family stated Goal --  --  get this fluid off me   Adult/Peds Plan of Care   Guidelines --  --  --    Individualized Interventions/Recommendations #2 (if applicable) --  Monitor flolan drip settings and parameters.  --    Individualized Interventions/Recommendations #4 (if applicable) monitor I&O's  --  --    Outcome Evaluation (rationale for progressing/not progressing) every shift --  --  --     05/17/17 1749   Patient /Family stated Goal   Patient /Family stated Goal --    Adult/Peds Plan of Care   Guidelines Inpatient Nursing Guidelines   Individualized Interventions/Recommendations #2 (if applicable) --    Individualized Interventions/Recommendations #4 (if applicable) --    Outcome Evaluation (rationale for progressing/not progressing) every shift ambulated in hallways with one person assist. contniue to diuresis pt per MD orders.

## 2017-05-17 NOTE — Progress Notes (Signed)
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 failure on 3Lpm now here with worsening LEx edema    Overnight events: Feels a little better overall. Notes mild improvement in edema, more energy today. Seems tpo be responding to IV Lasix, higher Cr today though. Stable PLTs. No other issues, ambulating in room, making urine, tolerating POs having BMs. Improved respiratory status as well, subjectively.     Review of Systems:   No frank SOB at rest, +DOE, ++ edema, no abdominal distention.  All other systems were reviewed and are negative except as noted above    Objective:    Current Medications:   digoxin  62.5 mcg QPM    furosemide  100 mg TID    medroxyPROGESTERone  1.25 mg Q24H NR    Prostacyclin Cassette Change   Daily    Prostacyclin Ice Pack   Q6H    Prostacyclin Tubing   Once per day on Mon Wed Fri    [START ON 05/18/2017] Prostacylin Batteries   Once per day on Mon    sildenafil  80 mg TID    sodium chloride (PF)  3 mL Q8H    spironolactone  75 mg Daily      epoprostenol (FLOLAN) infusion 35 ng/kg/min (05/17/17 1341)    sodium chloride        loperamide  2 mg TID PRN    nalOXone  0.1 mg Q2 Min PRN    sodium chloride (PF)  3 mL PRN    sodium chloride   Continuous PRN       Vital Signs:  Temperature:  [98 F (36.7 C)-98.2 F (36.8 C)] 98 F (36.7 C) (05/05 0850)  Blood pressure (BP): (112-132)/(56-77) 132/77 (05/05 1222)  Heart Rate:  [87-94] 92 (05/05 1222)  Respirations:  [14-20] 18 (05/05 1222)  Pain Score: 0 (05/05 1222)  O2 Device: Nasal cannula (05/05 1222)  O2 Flow Rate (L/min):  [3 l/min] 3 l/min (05/05 1222)  SpO2:  [95 %-100 %] 99 % (05/05 1222)  Wt Readings from Last 1 Encounters:   05/17/17 64.8 kg (142 lb 14.4 oz)       Intake/Output (Current Shift):  05/04 0600 - 05/05 0559  In: 850 [P.O.:850]  Out: 1800  [Urine:1800]    Physical Exam:  Gen: A&O, conversing comfortably  Eyes: EOMI, pupils equal, anicteric  HENT: Atraumatic, normocephalic  Neck: + JVD, trachea midline  CV: RRR, +M, accentuated S2, No R/G/S3. L tunneled IJ CVC site is CDI  Resp: stable bibasilar crackles, no intercostal retractions/normal effort  Abdo: NT mild D, soft, no masses.  Ext: +2 BLEx edema, possibly a little better, no clubbing or cyanosis; no joint abnormalities  Neuro: No focal weakness or sensory deficit, CN grossly intact  Psych: Appropriate affect and mood, good insight. AnOx3  Skin: Normal temperature, no rash or nodules. Diffuse erythema, likely related to IV Epo; + brusing      Diagnostic Data:  Laboratory data:   Lab Results   Component Value Date    NA 137 05/17/2017    K 4.0 05/17/2017    CL 108 (H) 05/17/2017    BICARB 17 (L) 05/17/2017    BUN 79 (H) 05/17/2017    CREAT 2.13 (H) 05/17/2017  GLU 85 05/17/2017    Gordon 8.8 05/17/2017     Lab Results   Component Value Date    WBC 4.8 05/17/2017    HGB 9.6 (L) 05/17/2017    HCT 30.9 (L) 05/17/2017    PLT 52 (L) 05/17/2017    SEG 67 05/17/2017    BAND 5 05/17/2017    LYMPHS 19 05/17/2017    MONOS 4 05/17/2017    EOS 3 05/17/2017     Lab Results   Component Value Date    AST 35 (H) 05/15/2017    ALT 9 05/15/2017    ALK 155 (H) 05/15/2017    TBILI 0.36 05/15/2017    TP 7.0 05/15/2017    ALB 3.5 05/15/2017       Post-Op studies:  No new imaging    Assessment and Plan:  67 year old F with 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 failure on 3Lpm now here with worsening LEx edema.  1. 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 PAH:  - See above for diuresis plan  - Continue PAH Rx for now  without changes  3. AKI:  - Acute on mild CKD  - Trend Cr (worse today)  4. Thrombocytopenia:  - Acute on chronic; stable  - Follow CBC  - Likely in setting of recent infection  5. Chronic hypoxemic resp failure:  - Continue home O2 without changes      Diana Ayars, MD  Pulmonary and Critical Care  PID [phone]/ Pager [phone]

## 2017-05-17 NOTE — Plan of Care (Addendum)
Problem: Promotion of Health and Safety  Goal: Promotion of Health and Safety  The patient remains safe, receives appropriate treatment and achieves optimal outcomes (physically, psychosocially, and spiritually) within the limitations of the disease process by discharge.    Information below is the current care plan.  Outcome: Progressing   05/16/17 1941 05/17/17 0317   Patient /Family stated Goal   Patient /Family stated Goal "Get rid of this extra fluid, go home." --    Adult/Peds Plan of Care   Guidelines --  Inpatient Nursing Guidelines   Individualized Interventions/Recommendations #1 --  Monitor strict intake and output, obtain weight while diuresing.    Individualized Interventions/Recommendations #2 (if applicable) --  Monitor flolan drip settings and parameters.    Outcome Evaluation (rationale for progressing/not progressing) every shift --  Patient assisted to bathroom, strict intake and output monitored, weight obtained. Flolan drip settings correct as ordered, ice changed every 6 hours.

## 2017-05-18 LAB — CBC WITH DIFF, BLOOD
ANC-Automated: 3.1 10*3/uL (ref 1.6–7.0)
Abs Basophils: 0 10*3/uL (ref <0.1)
Abs Eosinophils: 0.1 10*3/uL (ref 0.1–0.5)
Abs Lymphs: 1 10*3/uL (ref 0.8–3.1)
Abs Monos: 0.3 10*3/uL (ref 0.2–0.8)
Basophils: 0 %
Eosinophils: 1 %
Hct: 31.3 % — ABNORMAL LOW (ref 34.0–45.0)
Hgb: 9.4 gm/dL — ABNORMAL LOW (ref 11.2–15.7)
Imm Gran %: 3 % — ABNORMAL HIGH (ref <1)
Imm Gran Abs: 0.1 10*3/uL (ref <0.1)
Lymphocytes: 22 %
MCH: 24.4 pg — ABNORMAL LOW (ref 26.0–32.0)
MCHC: 30 g/dL — ABNORMAL LOW (ref 32.0–36.0)
MCV: 81.1 um3 (ref 79.0–95.0)
Monocytes: 7 %
Plt Count: 52 10*3/uL — ABNORMAL LOW (ref 140–370)
RBC: 3.86 10*6/uL — ABNORMAL LOW (ref 3.90–5.20)
RDW: 19.7 % — ABNORMAL HIGH (ref 12.0–14.0)
Segs: 67 %
WBC: 4.7 10*3/uL (ref 4.0–10.0)

## 2017-05-18 LAB — MAGNESIUM, BLOOD: Magnesium: 2.3 mg/dL (ref 1.6–2.4)

## 2017-05-18 LAB — PHOSPHORUS, BLOOD: Phosphorous: 3.9 mg/dL (ref 2.7–4.5)

## 2017-05-18 LAB — BASIC METABOLIC PANEL, BLOOD
Anion Gap: 14 mmol/L (ref 7–15)
Anion Gap: 15 mmol/L (ref 7–15)
BUN: 78 mg/dL — ABNORMAL HIGH (ref 8–23)
BUN: 78 mg/dL — ABNORMAL HIGH (ref 8–23)
Bicarbonate: 17 mmol/L — ABNORMAL LOW (ref 22–29)
Bicarbonate: 16 mmol/L — ABNORMAL LOW (ref 22–29)
Calcium: 8.9 mg/dL (ref 8.5–10.6)
Calcium: 8.9 mg/dL (ref 8.5–10.6)
Chloride: 104 mmol/L (ref 98–107)
Chloride: 104 mmol/L (ref 98–107)
Creatinine: 2.22 mg/dL — ABNORMAL HIGH (ref 0.51–0.95)
Creatinine: 2.32 mg/dL — ABNORMAL HIGH (ref 0.51–0.95)
GFR: 22 mL/min
GFR: 21 mL/min
Glucose: 89 mg/dL (ref 70–99)
Glucose: 105 mg/dL — ABNORMAL HIGH (ref 70–99)
Potassium: 3.8 mmol/L (ref 3.5–5.1)
Potassium: 3.5 mmol/L (ref 3.5–5.1)
Sodium: 135 mmol/L — ABNORMAL LOW (ref 136–145)
Sodium: 135 mmol/L — ABNORMAL LOW (ref 136–145)

## 2017-05-18 LAB — DIGOXIN, BLOOD: Digoxin: 1.6 ng/mL — ABNORMAL HIGH (ref 0.6–1.2)

## 2017-05-18 MED ORDER — DOPAMINE IN D5W 1.6-5 MG/ML-% IV SOLN
1.00 ug/kg/min | INTRAVENOUS | Status: DC
Start: 2017-05-18 — End: 2017-06-16
  Administered 2017-05-18 – 2017-06-05 (×36): 2 ug/kg/min via INTRAVENOUS
  Administered 2017-06-06 (×3): 3 ug/kg/min via INTRAVENOUS
  Administered 2017-06-06: 2 ug/kg/min via INTRAVENOUS
  Administered 2017-06-07 – 2017-06-11 (×14): 3 ug/kg/min via INTRAVENOUS
  Administered 2017-06-12: 2 ug/kg/min via INTRAVENOUS
  Administered 2017-06-12 (×2): 3 ug/kg/min via INTRAVENOUS
  Administered 2017-06-13 – 2017-06-15 (×8): 2 ug/kg/min via INTRAVENOUS
  Administered 2017-06-16: 1 ug/kg/min via INTRAVENOUS
  Filled 2017-05-18 (×15): qty 250

## 2017-05-18 MED ORDER — POTASSIUM CHLORIDE CRYS CR 20 MEQ OR TBCR
20.00 meq | EXTENDED_RELEASE_TABLET | Freq: Once | ORAL | Status: AC
Start: 2017-05-18 — End: 2017-05-18
  Administered 2017-05-18 (×2): 20 meq via ORAL
  Filled 2017-05-18: qty 1

## 2017-05-18 MED ORDER — DIGOXIN 0.125 MG OR TABS
67.50 ug | ORAL_TABLET | Freq: Every evening | ORAL | Status: DC
Start: 2017-05-19 — End: 2017-05-19

## 2017-05-18 MED ORDER — FUROSEMIDE 10 MG/ML IJ SOLN
100.00 mg | Freq: Three times a day (TID) | INTRAMUSCULAR | Status: DC
Start: 2017-05-18 — End: 2017-05-28
  Administered 2017-05-18 – 2017-05-28 (×30): 100 mg via INTRAVENOUS
  Filled 2017-05-18 (×30): qty 10

## 2017-05-18 NOTE — Progress Notes (Signed)
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 failure on 3Lpm now here with worsening LEx edema    24 Hour Events, Subjective  Feeling better today  Feels like edema is improved  Denies SOB  Even with lasix 100 TID  Creatinine 1.8 increased 2.2    Review of Systems:   All other systems were reviewed and are negative except as noted above    Current Medications:   digoxin  62.5 mcg QPM    furosemide  100 mg TID    medroxyPROGESTERone  1.25 mg Q24H NR    Prostacyclin Cassette Change   Daily    Prostacyclin Ice Pack   Q6H    Prostacyclin Tubing   Once per day on Mon Wed Fri    Prostacylin Batteries   Once per day on Mon    sildenafil  80 mg TID    sodium chloride (PF)  3 mL Q8H    spironolactone  75 mg Daily      DOPamine 2 mcg/kg/min (05/18/17 1001)    epoprostenol (FLOLAN) infusion 35 ng/kg/min (05/17/17 1900)    sodium chloride        loperamide  2 mg TID PRN    nalOXone  0.1 mg Q2 Min PRN    sodium chloride (PF)  3 mL PRN    sodium chloride   Continuous PRN       Vital Signs:  Temperature:  [97.9 F (36.6 C)-98.7 F (37.1 C)] 98.1 F (36.7 C) (05/06 0828)  Blood pressure (BP): (114-132)/(56-77) 114/58 (05/06 1001)  Heart Rate:  [87-92] 91 (05/06 1001)  Respirations:  [17-20] 17 (05/06 0828)  Pain Score: 0 (05/06 0828)  O2 Device: Nasal cannula (05/06 0828)  O2 Flow Rate (L/min):  [3 l/min] 3 l/min (05/06 0828)  SpO2:  [98 %-100 %] 99 % (05/06 0828)  Wt Readings from Last 1 Encounters:   05/18/17 65.6 kg (144 lb 10 oz)     Intake/Output (Current Shift):  05/05 0600 - 05/06 0559  In: 1270 [P.O.:1270]  Out: 1300 [Urine:1300]  Stool x1    Physical Exam:  Gen: A&O, conversing comfortably  Eyes: EOMI, pupils equal, anicteric  HENT: Atraumatic, normocephalic  Neck: + JVD, trachea midline  CV: RRR, +M, accentuated S2, No R/G/S3. L tunneled IJ CVC site is  CDI  Resp: stable bibasilar crackles, no intercostal retractions/normal effort  Abdo: NT mild D, soft, no masses.  Ext: +2 BLEx edema, possibly a little better, no clubbing or cyanosis; no joint abnormalities  Neuro: No focal weakness or sensory deficit, CN grossly intact  Psych: Appropriate affect and mood, good insight. AnOx3  Skin: Normal temperature, no rash or nodules. Diffuse erythema, likely related to IV Epo; + brusing    LABS:  Lab Results   Component Value Date    NA 135 (L) 05/18/2017    K 3.8 05/18/2017    CL 104 05/18/2017    BICARB 17 (L) 05/18/2017    BUN 78 (H) 05/18/2017    CREAT 2.22 (H) 05/18/2017    GLU 89 05/18/2017    Bertie 8.9 05/18/2017     Lab Results   Component Value Date    WBC 4.7 05/18/2017    HGB 9.4 (L) 05/18/2017    HCT 31.3 (L) 05/18/2017  PLT 52 (L) 05/18/2017    SEG 67 05/18/2017    BAND 5 05/17/2017    LYMPHS 22 05/18/2017    MONOS 7 05/18/2017    EOS 1 05/18/2017     No results found for: AST, ALT, ALK, TBILI, DBILI, TP, ALB    Chest Xray: 05/16/17  Enlargement of the cardiac silhouette from the prior chest radiograph which may related to cardiac chamber enlargement/increased intravascular volume but pericardial effusion cannot be excluded.  Sequela of portal hypertension. Query mild interstitial pulmonary edema and small right pleural effusion.    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 Failure on 3Lpm now here with worsening LE edema.    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 [name].    Doneen Poisson, MD  Fellow, Pulmonary and [place]  Office Phone:  [phone]  Pager: webpaging    Pulmonary Vascular Attending Addendum:  Patient was examined with Dr. Madelynn Done, imaging and tests reviewed.  Please see above note for full details and plan.    67 yo F with WHO I PAH associated with SLE and prior anorexogen use admitted with acute on chronic right heart failure and volume overload as well as AKI and thrombocytopenia.    -Continue IV diuretics for negative volume status  -Start dopamine for RV Support diuresis  -Continue sildenafil at home dose  -Follow thrombocytopenia    [name] MD  Pager [phone]

## 2017-05-18 NOTE — Interdisciplinary (Signed)
Acknowledged  RN Admission Summary Weight Loss Trigger:    Current Wt:   Weights (last 14 days)     Date/Time Weight Weight Source Percentage Weight Change (%) Who    05/18/17 0453  65.6 kg (144 lb 10 oz)  Standing scale  1.2 % MA    05/17/17 0200  64.8 kg (142 lb 14.4 oz)  Standing scale  0.7 % MA    05/16/17 0246  64.4 kg (141 lb 14.4 oz)  Standing scale  0 % RA    05/16/17 0240  64.4 kg (141 lb 14.4 oz)  Standing scale  -1.76 % RA    05/15/17 2245  65.5 kg (144 lb 7 oz)  Standing scale  0 % DF            Ht:  Ht Readings from Last 1 Encounters:   05/16/17 5\' 2"  (1.575 m)     IBW: 110lb  %IBW: 131  Body mass index is 26.45 kg/m.  UBW: 135lb-139lb  Wt Readings from Last 20 Encounters:   05/18/17 65.6 kg (144 lb 10 oz)   01/21/17 58.9 kg (129 lb 14.4 oz)   10/15/16 67.1 kg (148 lb)   09/10/16 67.1 kg (148 lb)   08/22/16 66 kg (145 lb 8 oz)   08/13/16 63.2 kg (139 lb 5.3 oz)   08/13/16 63 kg (139 lb)   07/28/16 64.2 kg (141 lb 8.6 oz)   06/21/16 64 kg (141 lb 1.5 oz)   05/16/16 74.4 kg (164 lb 0.4 oz)   02/26/16 73.9 kg (162 lb 14.4 oz)   12/12/15 73.5 kg (162 lb)   06/20/15 73.5 kg (162 lb)   02/12/15 73.4 kg (161 lb 12.8 oz)   12/21/14 76.3 kg (168 lb 5 oz)   09/27/14 75 kg (165 lb 6.4 oz)   04/12/14 73 kg (161 lb)   11/02/13 73.4 kg (161 lb 14.4 oz)   03/23/13 71.2 kg (157 lb 0 oz)   02/22/13 70.4 kg (155 lb 3.3 oz)       Pt reports an unintentional weight loss of 10# x 1 week (7% loss) due to nausea, vomiting and diarrhea but gained back all the lost weight at this time.    Offered pt nutrition supplement, pt declined. Pt report better and fair PO.   Relayed findings to RD via EMR. Will continue to f/u per policy.   Synetta Fail, DTR

## 2017-05-18 NOTE — Plan of Care (Signed)
Problem: Promotion of Health and Safety  Goal: Promotion of Health and Safety  The patient remains safe, receives appropriate treatment and achieves optimal outcomes (physically, psychosocially, and spiritually) within the limitations of the disease process by discharge.    Information below is the current care plan.  Outcome: Progressing   05/17/17 2234 05/18/17 0828 05/18/17 1250   Patient /Family stated Goal   Patient /Family stated Goal --  None specified --    Adult/Peds Plan of Care   Guidelines --  --  Inpatient Nursing Guidelines   Individualized Interventions/Recommendations #1 Monitor strict intake and output, obtain weight while diuresing.  --  --    Individualized Interventions/Recommendations #2 (if applicable) Monitor flolan drip settings and parameters.  --  --    Individualized Interventions/Recommendations #3 (if applicable) Encourage and assist with ambulation. --  --    Individualized Interventions/Recommendations #4 (if applicable) --  --  Pt started on Dopamine to hopefully increase output. Pt educated on medication. Will monitor output and vitals signs.    Individualized Interventions/Recommendations #5 (if applicable) --  --  Pt encouraged to get oob into chair with meals   Outcome Evaluation (rationale for progressing/not progressing) every shift --  --  Todays plan of care discussed with pt, pt agrees with current plan of care.

## 2017-05-19 LAB — CBC WITH DIFF, BLOOD
ANC-Automated: 3.4 10*3/uL (ref 1.6–7.0)
Abs Basophils: 0 10*3/uL (ref <0.1)
Abs Eosinophils: 0 10*3/uL (ref 0.1–0.5)
Abs Lymphs: 1 10*3/uL (ref 0.8–3.1)
Abs Monos: 0.3 10*3/uL (ref 0.2–0.8)
Basophils: 0 %
Eosinophils: 1 %
Hct: 32.4 % — ABNORMAL LOW (ref 34.0–45.0)
Hgb: 9.8 gm/dL — ABNORMAL LOW (ref 11.2–15.7)
Imm Gran %: 4 % — ABNORMAL HIGH (ref <1)
Imm Gran Abs: 0.2 10*3/uL — ABNORMAL HIGH (ref <0.1)
Lymphocytes: 21 %
MCH: 24.5 pg — ABNORMAL LOW (ref 26.0–32.0)
MCHC: 30.2 g/dL — ABNORMAL LOW (ref 32.0–36.0)
MCV: 81 um3 (ref 79.0–95.0)
Monocytes: 5 %
Plt Count: 60 10*3/uL — ABNORMAL LOW (ref 140–370)
RBC: 4 10*6/uL (ref 3.90–5.20)
RDW: 19.9 % — ABNORMAL HIGH (ref 12.0–14.0)
Segs: 69 %
WBC: 5 10*3/uL (ref 4.0–10.0)

## 2017-05-19 LAB — BASIC METABOLIC PANEL, BLOOD
Anion Gap: 13 mmol/L (ref 7–15)
BUN: 78 mg/dL — ABNORMAL HIGH (ref 8–23)
Bicarbonate: 18 mmol/L — ABNORMAL LOW (ref 22–29)
Calcium: 8.9 mg/dL (ref 8.5–10.6)
Chloride: 104 mmol/L (ref 98–107)
Creatinine: 2.29 mg/dL — ABNORMAL HIGH (ref 0.51–0.95)
GFR: 21 mL/min
Glucose: 102 mg/dL — ABNORMAL HIGH (ref 70–99)
Potassium: 4.1 mmol/L (ref 3.5–5.1)
Sodium: 135 mmol/L — ABNORMAL LOW (ref 136–145)

## 2017-05-19 LAB — PHOSPHORUS, BLOOD: Phosphorous: 4.3 mg/dL (ref 2.7–4.5)

## 2017-05-19 LAB — MAGNESIUM, BLOOD: Magnesium: 2.2 mg/dL (ref 1.6–2.4)

## 2017-05-19 LAB — DIGOXIN, BLOOD: Digoxin: 1.4 ng/mL — ABNORMAL HIGH (ref 0.6–1.2)

## 2017-05-19 MED ORDER — NYSTATIN 100000 UNIT/ML MT SUSP
5.00 mL | Freq: Four times a day (QID) | OROMUCOSAL | Status: DC
Start: 2017-05-19 — End: 2017-06-10
  Administered 2017-05-19 – 2017-06-10 (×86): 500000 [IU] via ORAL
  Filled 2017-05-19 (×85): qty 5

## 2017-05-19 NOTE — Plan of Care (Signed)
Problem: Promotion of Health and Safety  Goal: Promotion of Health and Safety  The patient remains safe, receives appropriate treatment and achieves optimal outcomes (physically, psychosocially, and spiritually) within the limitations of the disease process by discharge.    Information below is the current care plan.  Outcome: Progressing   05/17/17 2234 05/18/17 1250 05/18/17 2035   Patient /Family stated Goal   Patient /Family stated Goal --  --  to rest and sleep well    Adult/Peds Plan of Care   Guidelines --  Inpatient Nursing Guidelines --    Individualized Interventions/Recommendations #1 Monitor strict intake and output, obtain weight while diuresing.  --  --    Individualized Interventions/Recommendations #2 (if applicable) Monitor flolan drip settings and parameters.  --  --    Individualized Interventions/Recommendations #3 (if applicable) Encourage and assist with ambulation. --  --    Individualized Interventions/Recommendations #5 (if applicable) --  --  --    Outcome Evaluation (rationale for progressing/not progressing) every shift --  --  --     05/19/17 0158   Patient /Family stated Goal   Patient /Family stated Goal --    Adult/Peds Plan of Care   Guidelines --    Individualized Interventions/Recommendations #1 --    Individualized Interventions/Recommendations #2 (if applicable) --    Individualized Interventions/Recommendations #3 (if applicable) --    Individualized Interventions/Recommendations #5 (if applicable) Cluster nursing tasks to minimize interrruptions to allow rest/sleep time   Outcome Evaluation (rationale for progressing/not progressing) every shift Pt able to sleep for 2 hours straight without interruptions tonight. Will cont' to monitor and assess. Progress to goal.

## 2017-05-19 NOTE — Plan of Care (Signed)
Problem: Promotion of Health and Safety  Goal: Promotion of Health and Safety  The patient remains safe, receives appropriate treatment and achieves optimal outcomes (physically, psychosocially, and spiritually) within the limitations of the disease process by discharge.    Information below is the current care plan.  Outcome: Progressing   05/17/17 2234 05/18/17 1250 05/19/17 0730   Patient /Family stated Goal   Patient /Family stated Goal --  --  Ambulate in hallway today   Adult/Peds Plan of Care   Guidelines --  --  --    Individualized Interventions/Recommendations #1 Monitor strict intake and output, obtain weight while diuresing.  --  --    Individualized Interventions/Recommendations #2 (if applicable) Monitor flolan drip settings and parameters.  --  --    Individualized Interventions/Recommendations #3 (if applicable) Encourage and assist with ambulation. --  --    Individualized Interventions/Recommendations #4 (if applicable) --  Pt started on Dopamine to hopefully increase output. Pt educated on medication. Will monitor output and vitals signs.  --    Outcome Evaluation (rationale for progressing/not progressing) every shift --  --  --     05/19/17 1038   Patient /Family stated Goal   Patient /Family stated Goal --    Adult/Peds Plan of Care   Guidelines Inpatient Nursing Guidelines   Individualized Interventions/Recommendations #1 --    Individualized Interventions/Recommendations #2 (if applicable) --    Individualized Interventions/Recommendations #3 (if applicable) --    Individualized Interventions/Recommendations #4 (if applicable) --    Outcome Evaluation (rationale for progressing/not progressing) every shift Plan of care discussed with pt, Pt agrees with current plan of care

## 2017-05-19 NOTE — Progress Notes (Signed)
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 failure on 3Lpm now here with worsening LEx edema, AKI, now on dopamine    24 Hour Events, Subjective  Continues to improve today  Urinated 1L yesterday, with lasix 100 TID  Started dopamine 86mg/kg/min yesterday  Cr stablized at 2.3    Review of Systems:   All other systems were reviewed and are negative except as noted above    Current Medications:   furosemide  100 mg TID    medroxyPROGESTERone  1.25 mg Q24H NR    Prostacyclin Cassette Change   Daily    Prostacyclin Ice Pack   Q6H    Prostacyclin Tubing   Once per day on Mon Wed Fri    Prostacylin Batteries   Once per day on Mon    sildenafil  80 mg TID    sodium chloride (PF)  3 mL Q8H    spironolactone  75 mg Daily      DOPamine 2 mcg/kg/min (05/18/17 1900)    epoprostenol (FLOLAN) infusion 35 ng/kg/min (05/18/17 1900)    sodium chloride        loperamide  2 mg TID PRN    nalOXone  0.1 mg Q2 Min PRN    sodium chloride (PF)  3 mL PRN    sodium chloride   Continuous PRN       Vital Signs:  Temperature:  [97.9 F (36.6 C)-98.5 F (36.9 C)] 98.5 F (36.9 C) (05/07 0730)  Blood pressure (BP): (111-144)/(51-79) 130/65 (05/07 0730)  Heart Rate:  [89-92] 89 (05/07 0730)  Respirations:  [16-19] 18 (05/07 0730)  Pain Score: 0 (05/07 0730)  O2 Device: Nasal cannula (05/07 0730)  O2 Flow Rate (L/min):  [3 l/min] 3 l/min (05/07 0730)  SpO2:  [94 %-100 %] 97 % (05/07 0730)  Wt Readings from Last 1 Encounters:   05/19/17 65.4 kg (144 lb 2.9 oz)     Intake/Output (Current Shift):  05/06 0600 - 05/07 0559  In: 533.8 [P.O.:475; I.V.:58.8]  Out: 1550 [Urine:1550]  Stool x3    Physical Exam:  Gen: A&O, conversing comfortably  Eyes: EOMI, pupils equal, anicteric  HENT: Atraumatic, normocephalic  Neck: + JVD, trachea midline  CV: RRR, +M, accentuated S2, No R/G/S3. L tunneled  IJ CVC site is CDI  Resp: stable bibasilar crackles, no intercostal retractions/normal effort  Abdo: NT mild D, soft, no masses.  Ext: +2 BLEx edema  Neuro: No focal weakness or sensory deficit, CN grossly intact  Psych: Appropriate affect and mood, good insight. AnOx3  Skin: Normal temperature, no rash or nodules. Diffuse erythema, likely related to IV Epo; + brusing.     LABS:  Lab Results   Component Value Date    NA 135 (L) 05/19/2017    K 4.1 05/19/2017    CL 104 05/19/2017    BICARB 18 (L) 05/19/2017    BUN 78 (H) 05/19/2017    CREAT 2.29 (H) 05/19/2017    GLU 102 (H) 05/19/2017    [place] 8.9 05/19/2017     Lab Results   Component Value Date    WBC 5.0 05/19/2017    HGB 9.8 (L) 05/19/2017    HCT 32.4 (L) 05/19/2017    PLT 60 (L) 05/19/2017    SEG 69 05/19/2017  LYMPHS 21 05/19/2017    MONOS 5 05/19/2017    EOS 1 05/19/2017     No results found for: AST, ALT, ALK, TBILI, DBILI, TP, ALB    Chest Xray: 05/16/17  Enlargement of the cardiac silhouette from the prior chest radiograph which may related to cardiac chamber enlargement/increased intravascular volume but pericardial effusion cannot be excluded.  Sequela of portal hypertension. Query mild interstitial pulmonary edema and small right pleural effusion.    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 Failure on 3Lpm now here with worsening LE edema.    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   - Continue Dopamine 2 mcg to help with diuresis  - Continue home sildenafil and Flolan at 35 ng/kg/min  - Continue home oxygen  - Holding home digoxin due to elevated levels, watching digoxin levels daily  - Holding steroids for CTD due to patient request (makes me feel "crazy")    This patient was seen  and discussed with my attending physician, Dr. Poch.    Mazen Odish, MD  Fellow, Pulmonary and Critical Care Medicine  [facility]  Office Phone: [phone]  Pager: webpaging      Pulmonary Vascular Attending Addendum:  Patient was examined with Dr. Odish  Labs, imaging and tests reviewed.  Please see above note for full details and plan.     67 yo F with WHO I PAH associated with SLE and prior anorexogen use admitted with acute on chronic right heart failure and volume overload as well as AKI and thrombocytopenia.     -Continue IV diuretics for negative volume status  -Continue dopamine for RV Support diuresis  -Continue sildenafil at home dose  -Follow thrombocytopenia     David Poch MD  Pager [phone]

## 2017-05-20 ENCOUNTER — Encounter (HOSPITAL_COMMUNITY): Payer: Self-pay

## 2017-05-20 ENCOUNTER — Ambulatory Visit (HOSPITAL_COMMUNITY): Payer: PPO

## 2017-05-20 ENCOUNTER — Ambulatory Visit (HOSPITAL_COMMUNITY): Payer: PPO | Admitting: Pulmonary Medicine

## 2017-05-20 LAB — VBG+O2HBV+O2S+O2CNV
BE, Ven: -7.1 mmol/L — ABNORMAL LOW (ref <=2.3)
Flow Rate, Ven: 3 L/min
HCO3, Ven: 19 mmol/L — ABNORMAL LOW (ref 23–28)
Hct (Est), Ven: 32 % — ABNORMAL LOW (ref 36–46)
Hgb, Ven: 10.7 g/dL — ABNORMAL LOW (ref 12.0–16.0)
O2 Content, Ven: 11.5 vol %
O2 Hgb, Ven: 76.4 — ABNORMAL HIGH (ref 40.0–70.0)
O2 Sat, Ven: 78.7 %
Temp, Ven: 36.7 'C
pCO2, Ven (T): 38 mmHg — ABNORMAL LOW (ref 40–52)
pCO2, Ven (Uncorr): 38 mmHg — ABNORMAL LOW (ref 40–52)
pH, Ven (T): 7.3 — ABNORMAL LOW (ref 7.33–7.40)
pH, Ven (Uncorr): 7.3 — ABNORMAL LOW (ref 7.33–7.40)
pO2, Ven (T): 39 mmHg (ref 25–44)
pO2, Ven (Uncorr): 40 mmHg (ref 25–44)

## 2017-05-20 LAB — CBC WITH DIFF, BLOOD
ANC-Automated: 3.2 10*3/uL (ref 1.6–7.0)
Abs Basophils: 0 10*3/uL (ref <0.1)
Abs Eosinophils: 0.1 10*3/uL (ref 0.1–0.5)
Abs Lymphs: 0.8 10*3/uL (ref 0.8–3.1)
Abs Monos: 0.2 10*3/uL (ref 0.2–0.8)
Basophils: 0 %
Eosinophils: 1 %
Hct: 31.1 % — ABNORMAL LOW (ref 34.0–45.0)
Hgb: 9.7 gm/dL — ABNORMAL LOW (ref 11.2–15.7)
Imm Gran %: 4 % — ABNORMAL HIGH (ref <1)
Imm Gran Abs: 0.2 10*3/uL — ABNORMAL HIGH (ref <0.1)
Lymphocytes: 18 %
MCH: 25.1 pg — ABNORMAL LOW (ref 26.0–32.0)
MCHC: 31.2 g/dL — ABNORMAL LOW (ref 32.0–36.0)
MCV: 80.6 um3 (ref 79.0–95.0)
Monocytes: 5 %
Plt Count: 60 10*3/uL — ABNORMAL LOW (ref 140–370)
RBC: 3.86 10*6/uL — ABNORMAL LOW (ref 3.90–5.20)
RDW: 20 % — ABNORMAL HIGH (ref 12.0–14.0)
Segs: 70 %
WBC: 4.5 10*3/uL (ref 4.0–10.0)

## 2017-05-20 LAB — MAGNESIUM, BLOOD: Magnesium: 2.2 mg/dL (ref 1.6–2.4)

## 2017-05-20 LAB — DIGOXIN, BLOOD: Digoxin: 1.2 ng/mL (ref 0.6–1.2)

## 2017-05-20 LAB — URINALYSIS
Bilirubin: NEGATIVE
Blood: NEGATIVE
Glucose: NEGATIVE
Ketones: NEGATIVE
Leuk Esterase: NEGATIVE
Nitrite: NEGATIVE
Protein: NEGATIVE
Specific Gravity: 1.009 (ref 1.002–1.030)
Urobilinogen: NEGATIVE
pH: 6 (ref 5.0–8.0)

## 2017-05-20 LAB — BASIC METABOLIC PANEL, BLOOD
Anion Gap: 15 mmol/L (ref 7–15)
BUN: 76 mg/dL — ABNORMAL HIGH (ref 8–23)
Bicarbonate: 16 mmol/L — ABNORMAL LOW (ref 22–29)
Calcium: 8.6 mg/dL (ref 8.5–10.6)
Chloride: 104 mmol/L (ref 98–107)
Creatinine: 2.45 mg/dL — ABNORMAL HIGH (ref 0.51–0.95)
GFR: 20 mL/min
Glucose: 118 mg/dL — ABNORMAL HIGH (ref 70–99)
Potassium: 3.6 mmol/L (ref 3.5–5.1)
Sodium: 135 mmol/L — ABNORMAL LOW (ref 136–145)

## 2017-05-20 LAB — PHOSPHORUS, BLOOD: Phosphorous: 3.8 mg/dL (ref 2.7–4.5)

## 2017-05-20 MED ORDER — CHLOROTHIAZIDE SODIUM 500 MG IV SOLR
500.00 mg | Freq: Once | INTRAVENOUS | Status: DC
Start: 2017-05-20 — End: 2017-05-20
  Filled 2017-05-20: qty 18

## 2017-05-20 MED ORDER — CHLOROTHIAZIDE SODIUM 500 MG IV SOLR
500.00 mg | Freq: Once | INTRAVENOUS | Status: AC
Start: 2017-05-20 — End: 2017-05-20
  Administered 2017-05-20: 500 mg via INTRAVENOUS
  Filled 2017-05-20: qty 18

## 2017-05-20 NOTE — Progress Notes (Signed)
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 failure on 3Lpm now here with worsening LEx edema, AKI, now on dopamine    24 Hour Events, Subjective  Continues to improve today  Urinated -758L yesterday, with lasix 100 TID  Continued on dopamine 52mg/kg/min  Cr stablized at 2.3, now at 2.45    Review of Systems:   All other systems were reviewed and are negative except as noted above    Current Medications:   furosemide  100 mg TID    medroxyPROGESTERone  1.25 mg Q24H NR    nystatin  5 mL 4x Daily    Prostacyclin Cassette Change   Daily    Prostacyclin Ice Pack   Q6H    Prostacyclin Tubing   Once per day on Mon Wed Fri    Prostacylin Batteries   Once per day on Mon    sildenafil  80 mg TID    sodium chloride (PF)  3 mL Q8H    spironolactone  75 mg Daily      DOPamine 2 mcg/kg/min (05/20/17 0715)    epoprostenol (FLOLAN) infusion 35 ng/kg/min (05/20/17 0715)    sodium chloride        loperamide  2 mg TID PRN    nalOXone  0.1 mg Q2 Min PRN    sodium chloride (PF)  3 mL PRN    sodium chloride   Continuous PRN       Vital Signs:  Temperature:  [98 F (36.7 C)-98.5 F (36.9 C)] 98.4 F (36.9 C) (05/08 0830)  Blood pressure (BP): (112-133)/(58-70) 125/62 (05/08 0830)  Heart Rate:  [87-91] 90 (05/08 0830)  Respirations:  [18-20] 20 (05/08 0830)  Pain Score: 0 (05/08 0830)  O2 Device: Nasal cannula (05/08 0830)  O2 Flow Rate (L/min):  [3 l/min] 3 l/min (05/08 0830)  SpO2:  [94 %-99 %] 99 % (05/08 0830)  Wt Readings from Last 1 Encounters:   05/20/17 66.1 kg (145 lb 12.8 oz)     Intake/Output (Current Shift):  05/07 0600 - 05/08 0559  In: 992.1 [P.O.:870; I.V.:122.1]  Out: 1750 [Urine:1750]  Stool x1 today    Physical Exam:  Gen: A&O, conversing comfortably  Eyes: EOMI, pupils equal, anicteric  HENT: Atraumatic, normocephalic  Neck: + JVD, trachea midline  CV:  RRR, +M, accentuated S2, No R/G/S3. L tunneled IJ CVC site is CDI  Resp: stable bibasilar crackles, no intercostal retractions/normal effort  Abdo: NT mild D, soft, no masses.  Ext: +2 BLEx edema  Neuro: No focal weakness or sensory deficit, CN grossly intact  Psych: Appropriate affect and mood, good insight. AnOx3  Skin: Normal temperature, no rash or nodules. Diffuse erythema, likely related to IV Epo; + brusing.     LABS:  Lab Results   Component Value Date    NA 135 (L) 05/20/2017    K 3.6 05/20/2017    CL 104 05/20/2017    BICARB 16 (L) 05/20/2017    BUN 76 (H) 05/20/2017    CREAT 2.45 (H) 05/20/2017    GLU 118 (H) 05/20/2017    [place] 8.6 05/20/2017     Lab Results   Component Value Date    WBC 4.5 05/20/2017    HGB 9.7 (L) 05/20/2017    HCT 31.1 (L) 05/20/2017  PLT 60 (L) 05/20/2017    SEG 70 05/20/2017    LYMPHS 18 05/20/2017    MONOS 5 05/20/2017    EOS 1 05/20/2017     No results found for: AST, ALT, ALK, TBILI, DBILI, TP, ALB    Chest Xray: 05/16/17  Enlargement of the cardiac silhouette from the prior chest radiograph which may related to cardiac chamber enlargement/increased intravascular volume but pericardial effusion cannot be excluded.  Sequela of portal hypertension. Query mild interstitial pulmonary edema and small right pleural effusion.    TTE 05/16/17  1. The right ventricular size is severely enlarged and systolic function is reduced.  2. Severe pulmonary hypertension with RVSP measuring 100 mmHg.  3. The left ventricular size is normal and the left ventricular systolic function is normal.  4. Mild left ventricular hypertrophy.  5. Severely dilated right atrium.  6. Moderate tricuspid regurgitation.  7. Mild aortic stenosis with mild regurgitation.  8. Moderate Pulmonic regurgitation.  9. Severely dilated Pulmonary artery.  10. Compared to prior study estimated RVSP is elevated, was 75 mmHg and now 100 mmHg.    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 Failure on 3Lpm now here with worsening LE edema.    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   - Continue Dopamine 2 mcg/kg/min to help with diuresis  - Continue home sildenafil and Flolan at 35 ng/kg/min  - Continue home oxygen  - Holding home digoxin due to elevated levels, watching digoxin levels daily  - Holding steroids for CTD due to patient request (makes me feel "crazy")    This patient was seen and discussed with my attending physician, Dr. [name].    Doneen Poisson, MD  Fellow, Pulmonary and [place]  Office Phone: [phone]  Pager: webpaging    Pulmonary Vascular Attending Addendum:  Patient was examined withDr. Madelynn Done, imaging and tests reviewed.  Please see above note for full details and plan.    67 yo F with WHO I PAH associated with SLE and prior anorexogen use admitted with acute on chronic right heart failure and volume overload as well as AKI and thrombocytopenia.    -Continue IV diuretics for negative volume status  -Dose of diuril  -Continue dopamine for RV Support diuresis  -Continue sildenafil at home dose  -Follow thrombocytopenia    [name] MD  Pager [phone]

## 2017-05-20 NOTE — Plan of Care (Signed)
Problem: Promotion of Health and Safety  Goal: Promotion of Health and Safety  The patient remains safe, receives appropriate treatment and achieves optimal outcomes (physically, psychosocially, and spiritually) within the limitations of the disease process by discharge.    Information below is the current care plan.  Outcome: Progressing   05/17/17 2234 05/19/17 1928 05/20/17 0029   Patient /Family stated Goal   Patient /Family stated Goal --  To get some sleep --    Adult/Peds Plan of Care   Individualized Interventions/Recommendations #1 Monitor strict intake and output, obtain weight while diuresing.  --  --    Individualized Interventions/Recommendations #2 (if applicable) Monitor flolan drip settings and parameters.  --  --    Individualized Interventions/Recommendations #3 (if applicable) Encourage and assist with ambulation. --  --    Individualized Interventions/Recommendations #4 (if applicable) --  --  Encourage Pt to elevate BLE d/t edema while sitting in chair or in bed   Individualized Interventions/Recommendations #5 (if applicable) --  --  Monitor skin as it is fragile, ecchymotic, red   Outcome Evaluation (rationale for progressing/not progressing) every shift   05/17/17 2234 05/19/17 1928 05/20/17 0029   Patient /Family stated Goal   Patient /Family stated Goal --  To get some sleep --    Adult/Peds Plan of Care   Individualized Interventions/Recommendations #1 Monitor strict intake and output, obtain weight while diuresing.  --  --    Individualized Interventions/Recommendations #2 (if applicable) Monitor flolan drip settings and parameters.  --  --    Individualized Interventions/Recommendations #3 (if applicable) Encourage and assist with ambulation. --  --    Individualized Interventions/Recommendations #4 (if applicable) --  --  Encourage Pt to elevate BLE d/t edema while sitting in chair or in bed   Individualized Interventions/Recommendations #5 (if applicable) --  --  Monitor skin as it is  fragile, ecchymotic, red   Outcome Evaluation (rationale for progressing/not progressing) every shift --  --  Pt's VSS. Denies pain. Flolan remains infusing @ 35ng/kg/min. 33ml/24hrs. Dopamine remains infusing @ 4.62ml/hr. Pt on 3L NC, no SOB. +2 BLE, elevated in chair and now bed.     --  --  Pt's VSS. Denies pain. Floan remains infusing @ 35ng/kg/min. 78ml/24hrs. Dopamine remains infusing @ 4.20ml/hr. Pt on 3L NC, no SOB. +2 BLE, elevated in chair and now bed.

## 2017-05-20 NOTE — Plan of Care (Signed)
Problem: Promotion of Health and Safety  Goal: Promotion of Health and Safety  The patient remains safe, receives appropriate treatment and achieves optimal outcomes (physically, psychosocially, and spiritually) within the limitations of the disease process by discharge.    Information below is the current care plan.  Outcome: Progressing   05/17/17 2234 05/19/17 1038 05/20/17 0029   Patient /Family stated Goal   Patient /Family stated Goal --  --  --    Adult/Peds Plan of Care   Guidelines --  Inpatient Nursing Guidelines --    Individualized Interventions/Recommendations #1 Monitor strict intake and output, obtain weight while diuresing.  --  --    Individualized Interventions/Recommendations #2 (if applicable) Monitor flolan drip settings and parameters.  --  --    Individualized Interventions/Recommendations #3 (if applicable) Encourage and assist with ambulation. --  --    Individualized Interventions/Recommendations #4 (if applicable) --  --  Encourage Pt to elevate BLE d/t edema while sitting in chair or in bed   Individualized Interventions/Recommendations #5 (if applicable) --  --  Monitor skin as it is fragile, ecchymotic, red   Outcome Evaluation (rationale for progressing/not progressing) every shift --  --  Pt's VSS. Denies pain. Flolan remains infusing @ 35ng/kg/min. 53ml/24hrs. Dopamine remains infusing @ 4.40ml/hr. Pt on 3L NC, no SOB. +2 BLE, elevated in chair and now bed.     05/20/17 0830   Patient /Family stated Goal   Patient /Family stated Goal get fluid off   Adult/Peds Plan of Care   Guidelines --    Individualized Interventions/Recommendations #1 --    Individualized Interventions/Recommendations #2 (if applicable) --    Individualized Interventions/Recommendations #3 (if applicable) --    Individualized Interventions/Recommendations #4 (if applicable) --    Individualized Interventions/Recommendations #5 (if applicable) --    Outcome Evaluation (rationale for progressing/not progressing)  every shift --

## 2017-05-21 LAB — CBC WITH DIFF, BLOOD
ANC-Automated: 3.5 10*3/uL (ref 1.6–7.0)
Abs Basophils: 0 10*3/uL (ref <0.1)
Abs Eosinophils: 0 10*3/uL (ref 0.1–0.5)
Abs Lymphs: 1 10*3/uL (ref 0.8–3.1)
Abs Monos: 0.3 10*3/uL (ref 0.2–0.8)
Basophils: 0 %
Eosinophils: 1 %
Hct: 32.4 % — ABNORMAL LOW (ref 34.0–45.0)
Hgb: 9.9 gm/dL — ABNORMAL LOW (ref 11.2–15.7)
Imm Gran %: 4 % — ABNORMAL HIGH (ref <1)
Imm Gran Abs: 0.2 10*3/uL — ABNORMAL HIGH (ref <0.1)
Lymphocytes: 19 %
MCH: 24.6 pg — ABNORMAL LOW (ref 26.0–32.0)
MCHC: 30.6 g/dL — ABNORMAL LOW (ref 32.0–36.0)
MCV: 80.6 um3 (ref 79.0–95.0)
Monocytes: 6 %
Plt Count: 60 10*3/uL — ABNORMAL LOW (ref 140–370)
RBC: 4.02 10*6/uL (ref 3.90–5.20)
RDW: 19.6 % — ABNORMAL HIGH (ref 12.0–14.0)
Segs: 70 %
WBC: 5.1 10*3/uL (ref 4.0–10.0)

## 2017-05-21 LAB — BASIC METABOLIC PANEL, BLOOD
Anion Gap: 16 mmol/L — ABNORMAL HIGH (ref 7–15)
BUN: 79 mg/dL — ABNORMAL HIGH (ref 8–23)
Bicarbonate: 18 mmol/L — ABNORMAL LOW (ref 22–29)
Calcium: 8.8 mg/dL (ref 8.5–10.6)
Chloride: 101 mmol/L (ref 98–107)
Creatinine: 2.62 mg/dL — ABNORMAL HIGH (ref 0.51–0.95)
GFR: 18 mL/min
Glucose: 90 mg/dL (ref 70–99)
Potassium: 3.5 mmol/L (ref 3.5–5.1)
Sodium: 135 mmol/L — ABNORMAL LOW (ref 136–145)

## 2017-05-21 LAB — MAGNESIUM, BLOOD: Magnesium: 2.2 mg/dL (ref 1.6–2.4)

## 2017-05-21 LAB — BLOOD CULTURE
Blood Culture Result: NO GROWTH
Blood Culture Result: NO GROWTH

## 2017-05-21 LAB — PHOSPHORUS, BLOOD: Phosphorous: 4.9 mg/dL — ABNORMAL HIGH (ref 2.7–4.5)

## 2017-05-21 LAB — DIGOXIN, BLOOD: Digoxin: 1 ng/mL (ref 0.6–1.2)

## 2017-05-21 MED ORDER — POTASSIUM CHLORIDE CRYS CR 20 MEQ OR TBCR
20.00 meq | EXTENDED_RELEASE_TABLET | Freq: Once | ORAL | Status: AC
Start: 2017-05-21 — End: 2017-05-21
  Administered 2017-05-21: 20 meq via ORAL
  Filled 2017-05-21: qty 1

## 2017-05-21 NOTE — Plan of Care (Signed)
Problem: Promotion of Health and Safety  Goal: Promotion of Health and Safety  The patient remains safe, receives appropriate treatment and achieves optimal outcomes (physically, psychosocially, and spiritually) within the limitations of the disease process by discharge.    Information below is the current care plan.  Outcome: Progressing   05/19/17 1038 05/20/17 2030 05/21/17 0224   Patient /Family stated Goal   Patient /Family stated Goal --  "for my swelling to go down" --    Adult/Peds Plan of Care   Guidelines Inpatient Nursing Guidelines --  --    Individualized Interventions/Recommendations #1 --  --  Monitor strict I/O's and daily weight while on diuretics   Individualized Interventions/Recommendations #2 (if applicable) --  --  Monitor Flolan while infusing and ensure ice packs changed q6 hrs, monitor for pt's tolerance of medication   Individualized Interventions/Recommendations #3 (if applicable) --  --  Monitor pt's broviac catheter for s/o infection and ensure line remains well secured and patent while flolan infusing   Individualized Interventions/Recommendations #4 (if applicable) --  --  Encourage to elevate BLE while supine/in bed and maintain acewraps BLE to decrease swelling   Individualized Interventions/Recommendations #5 (if applicable) --  --  Pt continues to have BLE edema and abd swelling but diuresing adequate amounts. Pt tolerating flolan with VSS. Safety maintained this shift.

## 2017-05-21 NOTE — Plan of Care (Signed)
Problem: Promotion of Health and Safety  Goal: Promotion of Health and Safety  The patient remains safe, receives appropriate treatment and achieves optimal outcomes (physically, psychosocially, and spiritually) within the limitations of the disease process by discharge.    Information below is the current care plan.  Outcome: Progressing   05/19/17 1038 05/20/17 0029 05/21/17 0224   Patient /Family stated Goal   Patient /Family stated Goal --  --  --    Adult/Peds Plan of Care   Guidelines Inpatient Nursing Guidelines --  --    Individualized Interventions/Recommendations #1 --  --  Monitor strict I/O's and daily weight while on diuretics   Individualized Interventions/Recommendations #2 (if applicable) --  --  Monitor Flolan while infusing and ensure ice packs changed q6 hrs, monitor for pt's tolerance of medication   Individualized Interventions/Recommendations #3 (if applicable) --  --  Monitor pt's broviac catheter for s/o infection and ensure line remains well secured and patent while flolan infusing   Individualized Interventions/Recommendations #4 (if applicable) --  --  Encourage to elevate BLE while supine/in bed and maintain acewraps BLE to decrease swelling   Individualized Interventions/Recommendations #5 (if applicable) --  --  Pt continues to have BLE edema and abd swelling but diuresing adequate amounts. Pt tolerating flolan with VSS. Safety maintained this shift.   Outcome Evaluation (rationale for progressing/not progressing) every shift --  Pt's VSS. Denies pain. Flolan remains infusing @ 35ng/kg/min. 80ml/24hrs. Dopamine remains infusing @ 4.38ml/hr. Pt on 3L NC, no SOB. +2 BLE, elevated in chair and now bed.  --     05/21/17 0730   Patient /Family stated Goal   Patient /Family stated Goal reduced swelling   Adult/Peds Plan of Care   Guidelines --    Individualized Interventions/Recommendations #1 --    Individualized Interventions/Recommendations #2 (if applicable) --    Individualized  Interventions/Recommendations #3 (if applicable) --    Individualized Interventions/Recommendations #4 (if applicable) --    Individualized Interventions/Recommendations #5 (if applicable) --    Outcome Evaluation (rationale for progressing/not progressing) every shift --

## 2017-05-21 NOTE — Progress Notes (Signed)
Pharmacokinetics Note - Digoxin    Indication: Heart Failure  Outpatient Digoxin Regimen: 125 mcg PO every 24 hours.  Target Digoxin Level: 0.5-0.9 ng/mL (HFrEF)    Vital Signs:  Heart Rate:  [84-96] 84 (05/09 1113)  Cardiac Rhythm: Normal sinus rhythm;Ectopy (05/09 1113)  Blood pressure (BP): (116-132)/(57-71) 128/64 (05/09 1113)    Current Levels & Pertinent Labs  Digoxin   Date Value Ref Range Status   05/21/2017 1.0 0.6 - 1.2 ng/mL Final     Comment:     TOXIC > 2.0 ng/mL   05/20/2017 1.2 0.6 - 1.2 ng/mL Final     Comment:     TOXIC > 2.0 ng/mL   05/19/2017 1.4 0.6 - 1.2 ng/mL Final     Comment:     TOXIC > 2.0 ng/mL     Potassium   Date Value Ref Range Status   05/21/2017 3.5 3.5 - 5.1 mmol/L Final   05/20/2017 3.6 3.5 - 5.1 mmol/L Final   05/19/2017 4.1 3.5 - 5.1 mmol/L Final     Magnesium   Date Value Ref Range Status   05/21/2017 2.2 1.6 - 2.4 mg/dL Final   05/20/2017 2.2 1.6 - 2.4 mg/dL Final   05/19/2017 2.2 1.6 - 2.4 mg/dL Final     Calcium   Date Value Ref Range Status   05/21/2017 8.8 8.5 - 10.6 mg/dL Final   05/20/2017 8.6 8.5 - 10.6 mg/dL Final   05/19/2017 8.9 8.5 - 10.6 mg/dL Final     Creatinine   Date Value Ref Range Status   05/21/2017 2.62 0.51 - 0.95 mg/dL Final   05/20/2017 2.45 0.51 - 0.95 mg/dL Final   05/19/2017 2.29 0.51 - 0.95 mg/dL Final       Pharmacokinetic Parameters on Outpatient Regimen  Volume of distribution: 482 L  Clearance: 1.9 L/hr  Half-life: 173 hr  Extrapolated level at steady-state (Css pk) digoxin level: 2 ng/mL    Assessment/Recommendation:  Based on digoxin levels and rising SCr, would recommend conservatively initiating digoxin at 62.5 mcg PO every 48 hours (starting 5/10) if patient to restart digoxin, for estimated steady state level of 0.5 ng/mL. Digoxin level ordered for 5/12. Please continue to monitor renal function and electrolytes closely.     Digoxin plan discussed with primary team/MD.

## 2017-05-21 NOTE — Progress Notes (Signed)
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 failure on 3Lpm now here with worsening LEx edema, AKI, now on dopamine    24 Hour Events, Subjective  Continues to improve today subjectively  Denies SOB  Swelling decreasing in LE  Urinated -600L yesterday, with lasix 100 TID + 1 dose of diural  Continued on dopamine 43mg/kg/min  Cr uptrending to 2.6    Review of Systems:   All other systems were reviewed and are negative except as noted above    Current Medications:   furosemide  100 mg TID    medroxyPROGESTERone  1.25 mg Q24H NR    nystatin  5 mL 4x Daily    Prostacyclin Cassette Change   Daily    Prostacyclin Ice Pack   Q6H    Prostacyclin Tubing   Once per day on Mon Wed Fri    Prostacylin Batteries   Once per day on Mon    sildenafil  80 mg TID    sodium chloride (PF)  3 mL Q8H    spironolactone  75 mg Daily      DOPamine 2 mcg/kg/min (05/21/17 0730)    epoprostenol (FLOLAN) infusion 35 ng/kg/min (05/21/17 0730)    sodium chloride        loperamide  2 mg TID PRN    nalOXone  0.1 mg Q2 Min PRN    sodium chloride (PF)  3 mL PRN    sodium chloride   Continuous PRN       Vital Signs:  Temperature:  [98 F (36.7 C)-98.5 F (36.9 C)] 98 F (36.7 C) (05/09 1113)  Blood pressure (BP): (116-132)/(57-71) 128/64 (05/09 1113)  Heart Rate:  [84-96] 84 (05/09 1113)  Respirations:  [16-18] 18 (05/09 1113)  Pain Score: 0 (05/09 1113)  O2 Device: Nasal cannula (05/09 1113)  O2 Flow Rate (L/min):  [3 l/min] 3 l/min (05/09 1113)  SpO2:  [97 %-98 %] 98 % (05/09 1113)  Wt Readings from Last 1 Encounters:   05/21/17 66.3 kg (146 lb 2.6 oz)     Intake/Output (Current Shift):  05/08 0600 - 05/09 0559  In: 1175.6 [P.O.:1017; I.V.:158.6]  Out: 1800 [Urine:1800]  Stool x2 yesterday, 1x today    Physical Exam:  Gen: A&O, conversing comfortably  Eyes: EOMI, pupils equal,  anicteric  HENT: Atraumatic, normocephalic  Neck: + JVD, trachea midline  CV: RRR, +M, accentuated S2, No R/G/S3. L tunneled IJ CVC site is CDI  Resp: stable bibasilar crackles, no intercostal retractions/normal effort  Abdo: NT mild D, soft, no masses.  Ext: +2 BLEx edema  Neuro: No focal weakness or sensory deficit, CN grossly intact  Psych: Appropriate affect and mood, good insight. AnOx3  Skin: Normal temperature, no rash or nodules. Diffuse erythema, likely related to IV Epo; + brusing.     LABS:  Lab Results   Component Value Date    NA 135 (L) 05/21/2017    K 3.5 05/21/2017    CL 101 05/21/2017    BICARB 18 (L) 05/21/2017    BUN 79 (H) 05/21/2017    CREAT 2.62 (H) 05/21/2017    GLU 90 05/21/2017    [place] 8.8 05/21/2017     Lab Results   Component Value Date    WBC 5.1 05/21/2017  HGB 9.9 (L) 05/21/2017    HCT 32.4 (L) 05/21/2017    PLT 60 (L) 05/21/2017    SEG 70 05/21/2017    LYMPHS 19 05/21/2017    MONOS 6 05/21/2017    EOS 1 05/21/2017     No results found for: AST, ALT, ALK, TBILI, DBILI, TP, ALB    Chest Xray: 05/16/17  Enlargement of the cardiac silhouette from the prior chest radiograph which may related to cardiac chamber enlargement/increased intravascular volume but pericardial effusion cannot be excluded.  Sequela of portal hypertension. Query mild interstitial pulmonary edema and small right pleural effusion.    TTE 05/16/17  1. The right ventricular size is severely enlarged and systolic function is reduced.  2. Severe pulmonary hypertension with RVSP measuring 100 mmHg.  3. The left ventricular size is normal and the left ventricular systolic function is normal.  4. Mild left ventricular hypertrophy.  5. Severely dilated right atrium.  6. Moderate tricuspid regurgitation.  7. Mild aortic stenosis with mild regurgitation.  8. Moderate Pulmonic regurgitation.  9. Severely dilated Pulmonary artery.  10. Compared to prior study estimated RVSP is elevated, was 75 mmHg and now 100  mmHg.    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 Failure on 3Lpm now here with worsening LE edema.    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   - Continue Dopamine 2 mcg/kg/min to help with diuresis  - Continue home sildenafil and Flolan at 35 ng/kg/min  - Continue home oxygen  - Holding home digoxin due to elevated levels, watching digoxin levels daily  - Holding steroids for CTD due to patient request (makes me feel "crazy"), may have to restart if no improvement in UOP.     This patient was seen and discussed with my attending physician, Dr. [name].    Doneen Poisson, MD  Fellow, Pulmonary and [place]  Office Phone: [phone]  Pager: webpaging      Pulmonary Vascular Attending Addendum:  Patient was examined withDr. Madelynn Done, imaging and tests reviewed.  Please see above note for full details and plan.    67 yo F with WHO I PAH associated with SLE and prior anorexogen use admitted with acute on chronic right heart failure and volume overload as well as AKI and thrombocytopenia.  Persistent edema ascites and worsening Cr today.    -Continue IV diuretics for negative volume status  -Continuedopamine for RV Support diuresis  -Continue sildenafil at home dose  -Follow thrombocytopenia    [name] MD  Pager [phone]

## 2017-05-22 LAB — CBC WITH DIFF, BLOOD
ANC-Automated: 3.4 10*3/uL (ref 1.6–7.0)
Abs Basophils: 0 10*3/uL (ref <0.1)
Abs Eosinophils: 0 10*3/uL (ref 0.1–0.5)
Abs Lymphs: 0.9 10*3/uL (ref 0.8–3.1)
Abs Monos: 0.3 10*3/uL (ref 0.2–0.8)
Basophils: 0 %
Eosinophils: 1 %
Hct: 30.2 % — ABNORMAL LOW (ref 34.0–45.0)
Hgb: 9.4 gm/dL — ABNORMAL LOW (ref 11.2–15.7)
Imm Gran %: 4 % — ABNORMAL HIGH (ref <1)
Imm Gran Abs: 0.2 10*3/uL — ABNORMAL HIGH (ref <0.1)
Lymphocytes: 19 %
MCH: 24.7 pg — ABNORMAL LOW (ref 26.0–32.0)
MCHC: 31.1 g/dL — ABNORMAL LOW (ref 32.0–36.0)
MCV: 79.3 um3 (ref 79.0–95.0)
Monocytes: 7 %
Plt Count: 61 10*3/uL — ABNORMAL LOW (ref 140–370)
RBC: 3.81 10*6/uL — ABNORMAL LOW (ref 3.90–5.20)
RDW: 19.6 % — ABNORMAL HIGH (ref 12.0–14.0)
Segs: 70 %
WBC: 4.9 10*3/uL (ref 4.0–10.0)

## 2017-05-22 LAB — MAGNESIUM, BLOOD: Magnesium: 2.2 mg/dL (ref 1.6–2.4)

## 2017-05-22 LAB — BASIC METABOLIC PANEL, BLOOD
Anion Gap: 16 mmol/L — ABNORMAL HIGH (ref 7–15)
BUN: 83 mg/dL — ABNORMAL HIGH (ref 8–23)
Bicarbonate: 18 mmol/L — ABNORMAL LOW (ref 22–29)
Calcium: 8.3 mg/dL — ABNORMAL LOW (ref 8.5–10.6)
Chloride: 101 mmol/L (ref 98–107)
Creatinine: 2.51 mg/dL — ABNORMAL HIGH (ref 0.51–0.95)
GFR: 19 mL/min
Glucose: 91 mg/dL (ref 70–99)
Potassium: 3.4 mmol/L — ABNORMAL LOW (ref 3.5–5.1)
Sodium: 135 mmol/L — ABNORMAL LOW (ref 136–145)

## 2017-05-22 LAB — PHOSPHORUS, BLOOD: Phosphorous: 5 mg/dL — ABNORMAL HIGH (ref 2.7–4.5)

## 2017-05-22 LAB — URIC ACID, BLOOD: Uric Acid: 12.3 mg/dL — ABNORMAL HIGH (ref 2.4–5.7)

## 2017-05-22 MED ORDER — POTASSIUM CHLORIDE CRYS CR 20 MEQ OR TBCR
20.00 meq | EXTENDED_RELEASE_TABLET | ORAL | Status: AC
Start: 2017-05-22 — End: 2017-05-22
  Administered 2017-05-22 (×3): 20 meq via ORAL
  Filled 2017-05-22 (×2): qty 1

## 2017-05-22 MED ORDER — RASBURICASE 1.5 MG IV SOLR
3.00 mg | Freq: Once | INTRAVENOUS | Status: DC
Start: 2017-05-22 — End: 2017-05-22

## 2017-05-22 NOTE — Plan of Care (Signed)
Problem: Promotion of Health and Safety  Goal: Promotion of Health and Safety  The patient remains safe, receives appropriate treatment and achieves optimal outcomes (physically, psychosocially, and spiritually) within the limitations of the disease process by discharge.    Information below is the current care plan.  Outcome: Progressing   05/22/17 0729 05/22/17 0927   Patient /Family stated Goal   Patient /Family stated Goal to get better --    Adult/Peds Plan of Care   Individualized Interventions/Recommendations #1 --  Monitor vital signs and hemodynamics   Individualized Interventions/Recommendations #2 (if applicable) --  Monitor Flolan infusion, check rate and cassette volume, ensure ice packs q6h   Individualized Interventions/Recommendations #3 (if applicable) --  Monitor Broviac catheter for s/s of infection   Individualized Interventions/Recommendations #4 (if applicable) --  Ensure and monitor strict intake and output   Individualized Interventions/Recommendations #5 (if applicable) --  Encourage and assist with ambulation       Comments: Assess and monitor patients ability to perform ADLs.  Patient able to ambulate in the room and in hallway w/assist, brushes own teeth, and eats independently.  Will continue to monitor and to update as needed.

## 2017-05-22 NOTE — Interdisciplinary (Signed)
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 failure on 3Lpm now here with worsening LEx edema, AKI, now on dopamine     Past Medical History:   Diagnosis Date    Pulmonary HTN (CMS-HCC)     Sjoegren syndrome (CMS-HCC) 07/12/2009      No past surgical history on file.     Anthropometrics   Height: 5\' 2"  (157.5 cm)   Weight: 65.9 kg (145 lb 4.5 oz)      IBW= 110lb  %IBW= 132%  Body mass index is 26.57 kg/m.   UBW: 135-139lb per DTR 05/18/17  Weights (last 14 days)     Date/Time Weight Weight Source Percentage Weight Change (%) Who    05/22/17 0519  65.9 kg (145 lb 4.5 oz)  Standing scale  -0.6 % SB    05/21/17 0400  66.3 kg (146 lb 2.6 oz)  Standing scale  0.25 % SB    05/20/17 0437  66.1 kg (145 lb 12.8 oz)  Standing scale  1.12 % KW    05/19/17 0158  65.4 kg (144 lb 2.9 oz)  Standing scale  -0.31 % LC    05/18/17 0453  65.6 kg (144 lb 10 oz)  Standing scale  1.2 % MA    05/17/17 0200  64.8 kg (142 lb 14.4 oz)  Standing scale  0.7 % MA    05/16/17 0246  64.4 kg (141 lb 14.4 oz)  Standing scale  0 % RA    05/16/17 0240  64.4 kg (141 lb 14.4 oz)  Standing scale  -1.76 % RA    05/15/17 2245  65.5 kg (144 lb 7 oz)  Standing scale  0 % DF               Diet:Diet Therapeutic; Renal Restricted; Renal 60g Protein/ 2g Na/ 2g K+/ 1g Phos  PO Intake: Pt reports "not too bad" appetite. Average intake 98% x 17 meals. No intake barriers were identified at this time.   Tray Items Taken for the past 168 hrs:   Number of Items Taken Number of Items on Tray Diet Tolerance   05/16/17 0900 4 4 Tolerates   05/16/17 1300 5 5 Tolerates   05/16/17 1758 5 5 Tolerates   05/17/17 0900 4 4 Tolerates   05/17/17 1232 5 5 Tolerates   05/17/17 1740 4 4 Tolerates   05/18/17 0951 3 3 Tolerates   05/18/17 1309 3 3 Tolerates   05/19/17 0900 4 4 Tolerates   05/19/17 1155 3 3 Tolerates   05/19/17 1752 2.5 3 Tolerates   05/19/17 1928 -- -- Tolerates    05/20/17 1246 4 4 Tolerates   05/20/17 1900 3 3 Tolerates   05/21/17 0730 3 3 Tolerates   05/21/17 1330 4 4 Tolerates   05/21/17 1803 4 5 Tolerates   05/22/17 0800 3 3 Tolerates     No data found.  Supplement Intake for the past 168 hrs:   Diet Supplements Supplement  - Intake (mL) Supplement Tolerance   05/17/17 1740 premierpro 325 mL Tolerated well       Allergies/Intolerances:   Allergies   Allergen Reactions    Allopurinol Hives    Levaquin Swelling and Other     Severe joint pain      Ofloxacin Nausea and Vomiting    Chlorhexidine Itching    Sulfa Drugs  Unspecified     Chewing/Swallowing Difficulty: pt denied.   GI: no NVD. LBM: 05/21/17 x 3   Stool Assessment for the past 168 hrs:   Stool Appearance Stool Color Stool Amount Stool Source   05/16/17 0559 Soft formed Brown Medium Rectum   05/17/17 1222 Partially liquid Brown Large Rectum   05/17/17 1700 Soft formed Brown Medium Rectum   05/18/17 0500 Soft formed Brown Small Rectum   05/18/17 0838 Soft formed Brown Medium Rectum   05/18/17 1309 Partially liquid Brown Large Rectum   05/20/17 0830 Partially liquid Brown Medium Rectum   05/21/17 0956 Soft formed Brown Medium Rectum   05/21/17 1223 Soft formed Brown Large Rectum   05/21/17 1330 Partially liquid Brown Medium Rectum   05/21/17 1858 Soft formed Brown Medium Rectum     Skin:   Patient Lines/Drains/Airways Status    Active PICC Line / CVC Line / PIV Line / Drain / Airway / Intraosseous Line / Epidural Line / ART Line / Line Type / Wound     Name: Placement date: Placement time: Site: Days:    CVC Single Lumen - Present on admission Left Chest  --   --   Chest      Peripheral IV - 22 G Left Forearm  05/15/17   2327   Forearm  6    Peripheral IV - 20 G Right Antecubital  05/21/17   2100   Antecubital  less than 1                 Labs:   Lab Results   Component Value Date    A1C 5.5 07/20/2016     Recent Labs     05/20/17  0613 05/21/17  0548 05/22/17  0515   NA 135* 135* 135*   K 3.6 3.5 3.4*   CL 104  101 101   BICARB 16* 18* 18*   BUN 76* 79* 83*   CREAT 2.45* 2.62* 2.51*   GLU 118* 90 91   [place] 8.6 8.8 8.3*   MG 2.2 2.2 2.2   PHOS 3.8 4.9* 5.0*       BS POCT: No results for input(s): GLUCPOCT in the last 72 hours.    Medications   Current Facility-Administered Medications   Medication    DOPamine (INTROPIN) 400 mg/250 mL infusion (1600 mcg/mL)    epoprostenol (FLOLAN) 60,000 ng/mL in glycine diluent 100 mL infusion    furosemide (LASIX) injection 100 mg    loperamide (IMODIUM) capsule 2 mg    medroxyPROGESTERone (PROVERA) tablet 1.25 mg    nalOXone (NARCAN) injection 0.1 mg    nystatin (MYCOSTATIN) suspension 500,000 Units    Prostacyclin Cassette Change    Prostacyclin Ice Pack    Prostacyclin Tubing    Prostacylin Batteries    rasburicase (FASTURTEC) 3 mg in sodium chloride 0.9 % 50 mL IVPB    sildenafil (REVATIO, VIAGRA) tablet 80 mg    sodium chloride (PF) 0.9 % flush 3 mL    sodium chloride (PF) 0.9 % flush 3 mL    sodium chloride 0.9 % TKO infusion    spironolactone (ALDACTONE) tablet 75 mg       Education: Discussed Renal Diet and menu w/ pt. Emphasized importance of adequate intake and encouraged good PO.     PLAN: Will continue to monitor PO intake and diet tolerance. Will relay relevant, significant pt info and status change to Registered Dietitian.    Delbert Harness, DTR

## 2017-05-22 NOTE — Plan of Care (Signed)
Problem: Promotion of Health and Safety  Goal: Promotion of Health and Safety  The patient remains safe, receives appropriate treatment and achieves optimal outcomes (physically, psychosocially, and spiritually) within the limitations of the disease process by discharge.    Information below is the current care plan.   05/19/17 1038 05/21/17 0224 05/22/17 0020   Adult/Peds Plan of Care   Guidelines Inpatient Nursing Guidelines --  --    Individualized Interventions/Recommendations #1 --  Monitor strict I/O's and daily weight while on diuretics --    Individualized Interventions/Recommendations #2 (if applicable) --  Monitor Flolan while infusing and ensure ice packs changed q6 hrs, monitor for pt's tolerance of medication --    Individualized Interventions/Recommendations #3 (if applicable) --  Monitor pt's broviac catheter for s/o infection and ensure line remains well secured and patent while flolan infusing --    Individualized Interventions/Recommendations #4 (if applicable) --  Encourage to elevate BLE while supine/in bed and maintain acewraps BLE to decrease swelling --    Outcome Evaluation (rationale for progressing/not progressing) every shift --  --  Pt's BLE edema continues to improve, diuresing fair amounts. Flolan and dopamin infusing as ordered and pt tolerating well. Safety maintained.      05/19/17 1038 05/21/17 0224 05/22/17 0020   Adult/Peds Plan of Care   Guidelines Inpatient Nursing Guidelines --  --    Individualized Interventions/Recommendations #1 --  Monitor strict I/O's and daily weight while on diuretics --    Individualized Interventions/Recommendations #2 (if applicable) --  Monitor Flolan while infusing and ensure ice packs changed q6 hrs, monitor for pt's tolerance of medication --    Individualized Interventions/Recommendations #3 (if applicable) --  Monitor pt's broviac catheter for s/o infection and ensure line remains well secured and patent while flolan infusing --       Individualized Interventions/Recommendations #4 (if applicable) --  Encourage to elevate BLE while supine/in bed and maintain acewraps BLE to decrease swelling --    Outcome Evaluation (rationale for progressing/not progressing) every shift --  --  Pt's BLE edema continues to improve, diuresing fair amounts. Flolan and dopamin infusing as ordered and pt tolerating well. Safety maintained.

## 2017-05-22 NOTE — Progress Notes (Signed)
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 failure on 3Lpm now here with worsening LEx edema, AKI, now on dopamine    24 Hour Events, Subjective  Continues to improve today subjectively  Denies SOB  Swelling decreasing in LE  Urinated -800L yesterday, with lasix 100 TID  Continued on dopamine 61mg/kg/min  Cr up to 2.6 then down to 2.5 this AM    Review of Systems:   All other systems were reviewed and are negative except as noted above    Current Medications:   furosemide  100 mg TID    medroxyPROGESTERone  1.25 mg Q24H NR    nystatin  5 mL 4x Daily    Prostacyclin Cassette Change   Daily    Prostacyclin Ice Pack   Q6H    Prostacyclin Tubing   Once per day on Mon Wed Fri    Prostacylin Batteries   Once per day on Mon    rasburicase (ELITEK) IVPB  3 mg Once    sildenafil  80 mg TID    sodium chloride (PF)  3 mL Q8H    spironolactone  75 mg Daily      DOPamine 2 mcg/kg/min (05/21/17 0730)    epoprostenol (FLOLAN) infusion 35 ng/kg/min (05/21/17 1333)    sodium chloride        loperamide  2 mg TID PRN    nalOXone  0.1 mg Q2 Min PRN    sodium chloride (PF)  3 mL PRN    sodium chloride   Continuous PRN       Vital Signs:  Temperature:  [98 F (36.7 C)-98.4 F (36.9 C)] 98.1 F (36.7 C) (05/10 0759)  Blood pressure (BP): (112-130)/(50-70) 119/63 (05/10 0759)  Heart Rate:  [80-88] 88 (05/10 0519)  Respirations:  [18-20] 20 (05/10 0007)  Pain Score: 0 (05/10 0759)  O2 Device: Nasal cannula (05/10 0759)  O2 Flow Rate (L/min):  [3 l/min] 3 l/min (05/10 0759)  SpO2:  [96 %-100 %] 99 % (05/10 0759)  Wt Readings from Last 1 Encounters:   05/22/17 65.9 kg (145 lb 4.5 oz)     Intake/Output (Current Shift):  05/09 0600 - 05/10 0559  In: 1257.8 [P.O.:1030; I.V.:227.8]  Out: 2125 [Urine:2125]  Stool x3     Physical Exam:  Gen: A&O, conversing comfortably  Eyes: EOMI,  pupils equal, anicteric  HENT: Atraumatic, normocephalic  Neck: + JVD, trachea midline  CV: RRR, +M, accentuated S2, No R/G/S3. L tunneled IJ CVC site is CDI  Resp: stable bibasilar crackles, no intercostal retractions/normal effort  Abdo: NT mild D, soft, no masses.  Ext: +2 BLEx edema  Neuro: No focal weakness or sensory deficit, CN grossly intact  Psych: Appropriate affect and mood, good insight. AnOx3  Skin: Normal temperature, no rash or nodules. Diffuse erythema, likely related to IV Epo; + brusing.     LABS:  Lab Results   Component Value Date    NA 135 (L) 05/22/2017    K 3.4 (L) 05/22/2017    CL 101 05/22/2017    BICARB 18 (L) 05/22/2017    BUN 83 (H) 05/22/2017    CREAT 2.51 (H) 05/22/2017    GLU 91 05/22/2017    [place] 8.3 (L) 05/22/2017     Lab Results   Component  Value Date    WBC 4.9 05/22/2017    HGB 9.4 (L) 05/22/2017    HCT 30.2 (L) 05/22/2017    PLT 61 (L) 05/22/2017    SEG 70 05/22/2017    LYMPHS 19 05/22/2017    MONOS 7 05/22/2017    EOS 1 05/22/2017     No results found for: AST, ALT, ALK, TBILI, DBILI, TP, ALB    Chest Xray: 05/16/17  Enlargement of the cardiac silhouette from the prior chest radiograph which may related to cardiac chamber enlargement/increased intravascular volume but pericardial effusion cannot be excluded.  Sequela of portal hypertension. Query mild interstitial pulmonary edema and small right pleural effusion.    TTE 05/16/17  1. The right ventricular size is severely enlarged and systolic function is reduced.  2. Severe pulmonary hypertension with RVSP measuring 100 mmHg.  3. The left ventricular size is normal and the left ventricular systolic function is normal.  4. Mild left ventricular hypertrophy.  5. Severely dilated right atrium.  6. Moderate tricuspid regurgitation.  7. Mild aortic stenosis with mild regurgitation.  8. Moderate Pulmonic regurgitation.  9. Severely dilated Pulmonary artery.  10. Compared to prior study estimated RVSP is elevated, was 75 mmHg and  now 100 mmHg.    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 Failure on 3Lpm now here with worsening LE edema.    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   - Continue Dopamine 2 mcg/kg/min to help with diuresis  - Continue home sildenafil and Flolan at 35 ng/kg/min  - Continue home oxygen  - Holding home digoxin due to elevated levels, watching digoxin levels daily  - Holding steroids for CTD due to patient request (makes me feel "crazy"), may have to restart if no improvement in UOP.     This patient was seen and discussed with my attending physician, Dr. [name].    Doneen Poisson, MD  Fellow, Pulmonary and [place]  Office Phone: [phone]  Pager: webpaging    Pulmonary Vascular Attending Addendum:  Patient was examined withDr. Madelynn Done, imaging and tests reviewed.  Please see above note for full details and plan.    67 yo F with WHO I PAH associated with SLE and prior anorexogen use admitted with acute on chronic right heart failure and volume overload as well as AKI and thrombocytopenia.  Persistent edema ascites and worsening Cr today.    -Continue IV diuretics for negative volume status  -Continuedopamine for RV Support diuresis  -Continue sildenafil at home dose  -Follow thrombocytopenia    [name] MD  Pager [phone]

## 2017-05-22 NOTE — Interdisciplinary (Signed)
PCP follow-up appointment order pended by CM.  MD to sign.

## 2017-05-23 LAB — CBC WITH DIFF, BLOOD
ANC-Manual Mode: 3.4 10*3/uL (ref 1.6–7.0)
Abs Basophils: 0 10*3/uL (ref <0.1)
Abs Eosinophils: 0 10*3/uL (ref 0.1–0.5)
Abs Lymphs: 0.9 10*3/uL (ref 0.8–3.1)
Abs Monos: 0.2 10*3/uL (ref 0.2–0.8)
Basophils: 1 %
Eosinophils: 1 %
Hct: 30.2 % — ABNORMAL LOW (ref 34.0–45.0)
Hgb: 9.5 gm/dL — ABNORMAL LOW (ref 11.2–15.7)
Lymphocytes: 19 %
MCH: 24.9 pg — ABNORMAL LOW (ref 26.0–32.0)
MCHC: 31.5 g/dL — ABNORMAL LOW (ref 32.0–36.0)
MCV: 79.3 um3 (ref 79.0–95.0)
Monocytes: 4 %
Plt Count: 61 10*3/uL — ABNORMAL LOW (ref 140–370)
RBC: 3.81 10*6/uL — ABNORMAL LOW (ref 3.90–5.20)
RDW: 19.7 % — ABNORMAL HIGH (ref 12.0–14.0)
Segs: 75 %
WBC: 4.5 10*3/uL (ref 4.0–10.0)

## 2017-05-23 LAB — BASIC METABOLIC PANEL, BLOOD
Anion Gap: 16 mmol/L — ABNORMAL HIGH (ref 7–15)
BUN: 84 mg/dL — ABNORMAL HIGH (ref 8–23)
Bicarbonate: 19 mmol/L — ABNORMAL LOW (ref 22–29)
Calcium: 8.5 mg/dL (ref 8.5–10.6)
Chloride: 100 mmol/L (ref 98–107)
Creatinine: 2.45 mg/dL — ABNORMAL HIGH (ref 0.51–0.95)
GFR: 20 mL/min
Glucose: 84 mg/dL (ref 70–99)
Potassium: 3.4 mmol/L — ABNORMAL LOW (ref 3.5–5.1)
Sodium: 135 mmol/L — ABNORMAL LOW (ref 136–145)

## 2017-05-23 LAB — MDIFF
Number of Cells Counted: 114
Plt Est: DECREASED

## 2017-05-23 LAB — PHOSPHORUS, BLOOD: Phosphorous: 4.3 mg/dL (ref 2.7–4.5)

## 2017-05-23 LAB — MAGNESIUM, BLOOD: Magnesium: 2.3 mg/dL (ref 1.6–2.4)

## 2017-05-23 MED ORDER — POTASSIUM CHLORIDE CRYS CR 20 MEQ OR TBCR
20.00 meq | EXTENDED_RELEASE_TABLET | ORAL | Status: AC
Start: 2017-05-23 — End: 2017-05-23
  Administered 2017-05-23 (×2): 20 meq via ORAL
  Filled 2017-05-23 (×2): qty 1

## 2017-05-23 NOTE — Plan of Care (Signed)
Problem: Promotion of Health and Safety  Goal: Promotion of Health and Safety  The patient remains safe, receives appropriate treatment and achieves optimal outcomes (physically, psychosocially, and spiritually) within the limitations of the disease process by discharge.    Information below is the current care plan.   05/19/17 1038 05/22/17 0927 05/22/17 1932   Patient /Family stated Goal   Patient /Family stated Goal --  --  get better   Adult/Peds Plan of Care   Guidelines Inpatient Nursing Guidelines --  --    Individualized Interventions/Recommendations #1 --  Monitor vital signs and hemodynamics --    Individualized Interventions/Recommendations #2 (if applicable) --  --  --    Individualized Interventions/Recommendations #3 (if applicable) --  Monitor Broviac catheter for s/s of infection --    Individualized Interventions/Recommendations #4 (if applicable) --  Ensure and monitor strict intake and output --    Individualized Interventions/Recommendations #5 (if applicable) --  --  --    Outcome Evaluation (rationale for progressing/not progressing) every shift --  --  --     05/23/17 0106   Patient /Family stated Goal   Patient /Family stated Goal --    Adult/Peds Plan of Care   Guidelines --    Individualized Interventions/Recommendations #1 --    Individualized Interventions/Recommendations #2 (if applicable) Monitor Flolan infusion: Dose ( check rate and infusion volume),site, tubings   Individualized Interventions/Recommendations #3 (if applicable) --    Individualized Interventions/Recommendations #4 (if applicable) --    Individualized Interventions/Recommendations #5 (if applicable) Instruct pt to call for assistance whenever she needed to go to the bathroom. Education pt re: safety precautions and fall prevention.    Outcome Evaluation (rationale for progressing/not progressing) every shift Pt able to sleep off and on during the night. Pt still on Flolan infusion and Dopamine drip ( please refer to  [facility] for details). Per Pt her bilateral lower leg edema is improved. Pt partioipates in her plans of care. Will continue to monitor.

## 2017-05-23 NOTE — Progress Notes (Signed)
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     Interval: Tolerating dopamine. Remains in normal sinus rhythm. Making good urine.     Medications:  Scheduled Meds   furosemide  100 mg TID    medroxyPROGESTERone  1.25 mg Q24H NR    nystatin  5 mL 4x Daily    potassium chloride  20 mEq Q2H    Prostacyclin Cassette Change   Daily    Prostacyclin Tubing   Once per day on Mon Wed Fri    Prostacylin Batteries   Once per day on Mon    sildenafil  80 mg TID    sodium chloride (PF)  3 mL Q8H    spironolactone  75 mg Daily     PRN Meds   loperamide  2 mg TID PRN    nalOXone  0.1 mg Q2 Min PRN    sodium chloride (PF)  3 mL PRN    sodium chloride   Continuous PRN     IV Meds   DOPamine 2 mcg/kg/min (05/22/17 1649)    epoprostenol (FLOLAN) infusion 35 ng/kg/min (05/22/17 1310)    sodium chloride         Objective:  Temperature:  [97.6 F (36.4 C)-98.3 F (36.8 C)] 97.6 F (36.4 C) (05/11 0800)  Blood pressure (BP): (114-136)/(59-72) 126/59 (05/11 0800)  Heart Rate:  [85-90] 85 (05/11 0800)  Respirations:  [18-20] 18 (05/11 0800)  Pain Score: 0 (05/11 0800)  O2 Device: Nasal cannula (05/11 0800)  O2 Flow Rate (L/min):  [3 l/min] 3 l/min (05/11 0800)  SpO2:  [96 %-99 %] 98 % (05/11 0800)    Intake/Output Summary (Last 24 hours) at 05/23/2017 1018  Last data filed at 05/23/2017 0900  Gross per 24 hour   Intake 585.05 ml   Output 2573 ml   Net -1987.95 ml       GEN: NAD, appears comfortable, thrush improving  CV: RRR, no RCG, TR murmur  RESP: CTAB, no wheezes/rales  ABD: soft, NT/ND, NABS  EXT: 1+ LE edema  NEURO: non-focal, grossly intact    CBC  Recent Labs     05/22/17  0515 05/23/17  0540   WBC 4.9 4.5   HGB 9.4* 9.5*   HCT 30.2* 30.2*   PLT 61* 61*   SEG 70 75   LYMPHS 19 19   MONOS 7 4      Chemistry  Recent Labs     05/22/17  0515 05/23/17  0540   NA 135* 135*   K 3.4* 3.4*   CL 101 100   BICARB 18* 19*   BUN 83* 84*   CREAT 2.51* 2.45*   GLU 91 84   [place] 8.3* 8.5   MG 2.2 2.3   PHOS 5.0* 4.3     Assessment:   Diana Chambers is a 67 year old female with 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 Creatinine improving with good urine output since starting dopamine.     Plan:  - Continue Lasix 100 mg TID, goal I/O -1 to 2 L  - Continue Spironolactone.   - Continue Dopamine 2 mcg/kg/min to help with diuresis  -  Continue home sildenafil and Flolan at 35 ng/kg/min  - Continue home oxygen  - Nystatin swish and swallow for thrush  - Holding home digoxin due to elevated levels  - Holding steroids for CTD due to patient request (makes me feel "crazy"), may have to restart if no improvement in UOP.   - Full Code/Full Care    Kris Hartmann. Rudi Rummage, M.D., M.P.H.  Pulmonary and Critical Care   Pager [phone]/PID# [phone]

## 2017-05-23 NOTE — Plan of Care (Signed)
Problem: Promotion of Health and Safety  Goal: Promotion of Health and Safety  The patient remains safe, receives appropriate treatment and achieves optimal outcomes (physically, psychosocially, and spiritually) within the limitations of the disease process by discharge.    Information below is the current care plan.  Outcome: Progressing   05/23/17 0740 05/23/17 1425   Patient /Family stated Goal   Patient /Family stated Goal to get better --    Adult/Peds Plan of Care   Individualized Interventions/Recommendations #1 --  Monitor vital signs and hemodynamics   Individualized Interventions/Recommendations #2 (if applicable) --  Monitor Flolan infusion: dose, site, tubing, flow rate.   Individualized Interventions/Recommendations #3 (if applicable) --  Encourage and assist with ambulation   Individualized Interventions/Recommendations #4 (if applicable) --  Ensure and monitor strict intake and output       Comments: Assess and monitor patients ability to perform ADLs.  Patient able to ambulate w/assist, brushes own teeth, and eats independently.  Will continue to monitor and to update as needed.

## 2017-05-24 LAB — CBC WITH DIFF, BLOOD
ANC-Automated: 2.8 10*3/uL (ref 1.6–7.0)
Abs Basophils: 0 10*3/uL (ref <0.1)
Abs Eosinophils: 0 10*3/uL (ref 0.1–0.5)
Abs Lymphs: 1 10*3/uL (ref 0.8–3.1)
Abs Monos: 0.3 10*3/uL (ref 0.2–0.8)
Basophils: 1 %
Eosinophils: 1 %
Hct: 29.5 % — ABNORMAL LOW (ref 34.0–45.0)
Hgb: 9.2 gm/dL — ABNORMAL LOW (ref 11.2–15.7)
Imm Gran %: 3 % — ABNORMAL HIGH (ref <1)
Imm Gran Abs: 0.1 10*3/uL (ref <0.1)
Lymphocytes: 24 %
MCH: 24.9 pg — ABNORMAL LOW (ref 26.0–32.0)
MCHC: 31.2 g/dL — ABNORMAL LOW (ref 32.0–36.0)
MCV: 79.9 um3 (ref 79.0–95.0)
Monocytes: 6 %
Plt Count: 55 10*3/uL — ABNORMAL LOW (ref 140–370)
RBC: 3.69 10*6/uL — ABNORMAL LOW (ref 3.90–5.20)
RDW: 19.3 % — ABNORMAL HIGH (ref 12.0–14.0)
Segs: 65 %
WBC: 4.2 10*3/uL (ref 4.0–10.0)

## 2017-05-24 LAB — DIGOXIN, BLOOD: Digoxin: 0.8 ng/mL (ref 0.6–1.2)

## 2017-05-24 LAB — BASIC METABOLIC PANEL, BLOOD
Anion Gap: 14 mmol/L (ref 7–15)
BUN: 87 mg/dL — ABNORMAL HIGH (ref 8–23)
Bicarbonate: 21 mmol/L — ABNORMAL LOW (ref 22–29)
Calcium: 8.5 mg/dL (ref 8.5–10.6)
Chloride: 100 mmol/L (ref 98–107)
Creatinine: 2.3 mg/dL — ABNORMAL HIGH (ref 0.51–0.95)
GFR: 21 mL/min
Glucose: 82 mg/dL (ref 70–99)
Potassium: 3.7 mmol/L (ref 3.5–5.1)
Sodium: 135 mmol/L — ABNORMAL LOW (ref 136–145)

## 2017-05-24 LAB — PHOSPHORUS, BLOOD: Phosphorous: 4.6 mg/dL — ABNORMAL HIGH (ref 2.7–4.5)

## 2017-05-24 LAB — MAGNESIUM, BLOOD: Magnesium: 2.3 mg/dL (ref 1.6–2.4)

## 2017-05-24 MED ORDER — POTASSIUM CHLORIDE CRYS CR 20 MEQ OR TBCR
20.00 meq | EXTENDED_RELEASE_TABLET | Freq: Once | ORAL | Status: AC
Start: 2017-05-24 — End: 2017-05-24
  Administered 2017-05-24: 20 meq via ORAL
  Filled 2017-05-24: qty 1

## 2017-05-24 MED ORDER — DIGOXIN 0.125 MG OR TABS
62.50 ug | ORAL_TABLET | ORAL | Status: DC
Start: 2017-05-24 — End: 2017-05-24

## 2017-05-24 MED ORDER — DIGOXIN 0.125 MG OR TABS
62.50 ug | ORAL_TABLET | Freq: Every evening | ORAL | Status: DC
Start: 2017-05-24 — End: 2017-05-24

## 2017-05-24 NOTE — Plan of Care (Signed)
Problem: Promotion of Health and Safety  Goal: Promotion of Health and Safety  The patient remains safe, receives appropriate treatment and achieves optimal outcomes (physically, psychosocially, and spiritually) within the limitations of the disease process by discharge.    Information below is the current care plan.  Outcome: Progressing   05/23/17 0106 05/23/17 1425 05/24/17 0119   Adult/Peds Plan of Care   Individualized Interventions/Recommendations #1 --  Monitor vital signs and hemodynamics --    Individualized Interventions/Recommendations #2 (if applicable) --  Monitor Flolan infusion: dose, site, tubing, flow rate. --    Individualized Interventions/Recommendations #3 (if applicable) --  Encourage and assist with ambulation --    Individualized Interventions/Recommendations #4 (if applicable) --  Ensure and monitor strict intake and output --    Individualized Interventions/Recommendations #5 (if applicable) Instruct pt to call for assistance whenever she needed to go to the bathroom. Education pt re: safety precautions and fall prevention.  --  --    Outcome Evaluation (rationale for progressing/not progressing) every shift --  --  Pt able to rest overnight. Pt states being happy for her son visit and staying overnight. Pt's gtt and lines are been monitored as well any side effects. Pt able to use call light appropiately, requewsting for help when getting out/in of bed.

## 2017-05-24 NOTE — Plan of Care (Signed)
Problem: Promotion of Health and Safety  Goal: Promotion of Health and Safety  The patient remains safe, receives appropriate treatment and achieves optimal outcomes (physically, psychosocially, and spiritually) within the limitations of the disease process by discharge.    Information below is the current care plan.  Outcome: Progressing   05/19/17 1038 05/23/17 0106 05/23/17 0740   Patient /Family stated Goal   Patient /Family stated Goal --  --  to get better   Adult/Peds Plan of Care   Guidelines Inpatient Nursing Guidelines --  --    Individualized Interventions/Recommendations #1 --  --  --    Individualized Interventions/Recommendations #2 (if applicable) --  --  --    Individualized Interventions/Recommendations #3 (if applicable) --  --  --    Individualized Interventions/Recommendations #4 (if applicable) --  --  --    Individualized Interventions/Recommendations #5 (if applicable) --  Instruct pt to call for assistance whenever she needed to go to the bathroom. Education pt re: safety precautions and fall prevention.  --    Outcome Evaluation (rationale for progressing/not progressing) every shift --  --  --     05/23/17 1425 05/24/17 1118   Patient /Family stated Goal   Patient /Family stated Goal --  --    Adult/Peds Plan of Care   Guidelines --  --    Individualized Interventions/Recommendations #1 Monitor vital signs and hemodynamics --    Individualized Interventions/Recommendations #2 (if applicable) Monitor Flolan infusion: dose, site, tubing, flow rate. --    Individualized Interventions/Recommendations #3 (if applicable) Encourage and assist with ambulation --    Individualized Interventions/Recommendations #4 (if applicable) Ensure and monitor strict intake and output --    Individualized Interventions/Recommendations #5 (if applicable) --  --    Outcome Evaluation (rationale for progressing/not progressing) every shift --  VSS, Pt. ambulating in the hallway with slow steady gait with son, pt.  calls for help, continue flolan as ordered

## 2017-05-24 NOTE — Progress Notes (Signed)
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 failure on 3Lpm now here with worsening LEx edema, AKI, now on dopamine    24 Hour Events & Subjective  Continues to improve today subjectively  Denies SOB  Swelling decreasing in LE  I/O -1L  Creatinine down to 2.3 from 2.6  Continued on dopamine 76mg/kg/min    Review of Systems:   All other systems were reviewed and are negative except as noted above    Current Medications:   furosemide  100 mg TID    medroxyPROGESTERone  1.25 mg Q24H NR    nystatin  5 mL 4x Daily    potassium chloride  20 mEq Once    Prostacyclin Cassette Change   Daily    Prostacyclin Tubing   Once per day on Mon Wed Fri    Prostacylin Batteries   Once per day on Mon    sildenafil  80 mg TID    sodium chloride (PF)  3 mL Q8H    spironolactone  75 mg Daily      DOPamine 2 mcg/kg/min (05/24/17 0745)    epoprostenol (FLOLAN) infusion 35 ng/kg/min (05/24/17 0745)    sodium chloride        loperamide  2 mg TID PRN    nalOXone  0.1 mg Q2 Min PRN    sodium chloride (PF)  3 mL PRN    sodium chloride   Continuous PRN       Vital Signs:  Temperature:  [98 F (36.7 C)-98.5 F (36.9 C)] 98 F (36.7 C) (05/12 0744)  Blood pressure (BP): (110-129)/(57-73) 124/73 (05/12 0744)  Heart Rate:  [86-91] 86 (05/12 0744)  Respirations:  [16-20] 18 (05/12 0744)  Pain Score: 0 (05/12 0744)  O2 Device: Nasal cannula (05/12 0744)  O2 Flow Rate (L/min):  [3 l/min] 3 l/min (05/12 0744)  SpO2:  [96 %-99 %] 97 % (05/12 0744)  Wt Readings from Last 1 Encounters:   05/24/17 65.5 kg (144 lb 6.4 oz)     Intake/Output (Current Shift):  05/11 0600 - 05/12 0559  In: 565.9 [P.O.:250; I.V.:315.9]  Out: 10960[Urine:1650]  Stool x2    Physical Exam:  Gen: A&O, conversing comfortably  Eyes: EOMI, pupils equal, anicteric  HENT: Atraumatic, normocephalic  Neck: + JVD, trachea  midline  CV: RRR, +M, accentuated S2, No R/G/S3. L tunneled IJ CVC site is CDI  Resp: stable bibasilar crackles, no intercostal retractions/normal effort  Abdo: NT mild D, soft, no masses.  Ext: +2 BLEx edema  Neuro: No focal weakness or sensory deficit, CN grossly intact  Psych: Appropriate affect and mood, good insight. AnOx3  Skin: Normal temperature, no rash or nodules. Diffuse erythema, likely related to IV Epo; + brusing.     LABS:  Lab Results   Component Value Date    NA 135 (L) 05/24/2017    K 3.7 05/24/2017    CL 100 05/24/2017    BICARB 21 (L) 05/24/2017    BUN 87 (H) 05/24/2017    CREAT 2.30 (H) 05/24/2017    GLU 82 05/24/2017     8.5 05/24/2017     Lab Results   Component Value Date    WBC 4.2 05/24/2017    HGB 9.2 (L) 05/24/2017    HCT 29.5 (L)  05/24/2017    PLT 55 (L) 05/24/2017    SEG 65 05/24/2017    LYMPHS 24 05/24/2017    MONOS 6 05/24/2017    EOS 1 05/24/2017     No results found for: AST, ALT, ALK, TBILI, DBILI, TP, ALB    Chest Xray: 05/16/17  Enlargement of the cardiac silhouette from the prior chest radiograph which may related to cardiac chamber enlargement/increased intravascular volume but pericardial effusion cannot be excluded.  Sequela of portal hypertension. Query mild interstitial pulmonary edema and small right pleural effusion.    TTE 05/16/17  1. The right ventricular size is severely enlarged and systolic function is reduced.  2. Severe pulmonary hypertension with RVSP measuring 100 mmHg.  3. The left ventricular size is normal and the left ventricular systolic function is normal.  4. Mild left ventricular hypertrophy.  5. Severely dilated right atrium.  6. Moderate tricuspid regurgitation.  7. Mild aortic stenosis with mild regurgitation.  8. Moderate Pulmonic regurgitation.  9. Severely dilated Pulmonary artery.  10. Compared to prior study estimated RVSP is elevated, was 75 mmHg and now 100 mmHg.    ASSESSMENT AND PLAN  67 year old woman with h/o PAH in the setting of  CTD (on PDE5i and IV Epo) and chronic hypoxemic resp. Failure on 3Lpm now here with worsening LE edema.    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   - Continue Dopamine 2 mcg/kg/min to help with diuresis  - Continue home sildenafil and Flolan at 35 ng/kg/min  - Continue home oxygen  - Continue holding digoxin for now since on dopamine. Levels came down.   - Holding steroids for CTD due to patient request (makes me feel "crazy"), may have to restart if no improvement in UOP.     This patient was seen and discussed with my attending physician, Dr. Rudi Rummage.    Doneen Poisson, MD  Fellow, Pulmonary and [place]  Office Phone: [phone]  Pager: webpaging     Pulmonary Vascular Attending Addendum:    Patient was examined with the fellow, Dr. Erie Noe.  Labs, imaging and tests reviewed.   Please see above note for full details and plan.     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 Creatinine improving with good urine output since starting dopamine.  -1 L yesterday.  Creatinine down to 2.3.  Heart regular rate and rhythm.  Loud tricuspid regurgitation murmur.  Lungs clear to auscultation bilaterally.  1-2+ LE edema.     Plan:  - Continue Lasix 100 mg TID, goal I/O -1 to 2 L  - Continue Spironolactone.   - Continue Dopamine 2 mcg/kg/min to help with diuresis  - Continue home sildenafil and Flolan at 35 ng/kg/min  - Continue home oxygen  - Nystatin swish and swallow for thrush  - Holding home digoxin due to elevated levels  -  Full Code/Full Care    Kris Hartmann. Rudi Rummage, M.D., M.P.H.  Pulmonary and Critical  Care   Pager [phone]/PID# [phone]

## 2017-05-25 LAB — BASIC METABOLIC PANEL, BLOOD
Anion Gap: 14 mmol/L (ref 7–15)
BUN: 89 mg/dL — ABNORMAL HIGH (ref 8–23)
Bicarbonate: 18 mmol/L — ABNORMAL LOW (ref 22–29)
Calcium: 8.5 mg/dL (ref 8.5–10.6)
Chloride: 102 mmol/L (ref 98–107)
Creatinine: 2.26 mg/dL — ABNORMAL HIGH (ref 0.51–0.95)
GFR: 22 mL/min
Glucose: 83 mg/dL (ref 70–99)
Potassium: 3.6 mmol/L (ref 3.5–5.1)
Sodium: 134 mmol/L — ABNORMAL LOW (ref 136–145)

## 2017-05-25 LAB — CBC WITH DIFF, BLOOD
ANC-Automated: 2.6 10*3/uL (ref 1.6–7.0)
Abs Basophils: 0 10*3/uL (ref <0.1)
Abs Eosinophils: 0 10*3/uL (ref 0.1–0.5)
Abs Lymphs: 1.1 10*3/uL (ref 0.8–3.1)
Abs Monos: 0.3 10*3/uL (ref 0.2–0.8)
Basophils: 1 %
Eosinophils: 1 %
Hct: 30.4 % — ABNORMAL LOW (ref 34.0–45.0)
Hgb: 9.4 gm/dL — ABNORMAL LOW (ref 11.2–15.7)
Imm Gran %: 3 % — ABNORMAL HIGH (ref <1)
Imm Gran Abs: 0.1 10*3/uL (ref <0.1)
Lymphocytes: 26 %
MCH: 24.9 pg — ABNORMAL LOW (ref 26.0–32.0)
MCHC: 30.9 g/dL — ABNORMAL LOW (ref 32.0–36.0)
MCV: 80.4 um3 (ref 79.0–95.0)
Monocytes: 8 %
Plt Count: 55 10*3/uL — ABNORMAL LOW (ref 140–370)
RBC: 3.78 10*6/uL — ABNORMAL LOW (ref 3.90–5.20)
RDW: 19.2 % — ABNORMAL HIGH (ref 12.0–14.0)
Segs: 62 %
WBC: 4.1 10*3/uL (ref 4.0–10.0)

## 2017-05-25 LAB — PHOSPHORUS, BLOOD: Phosphorous: 5.1 mg/dL — ABNORMAL HIGH (ref 2.7–4.5)

## 2017-05-25 LAB — MAGNESIUM, BLOOD: Magnesium: 2.3 mg/dL (ref 1.6–2.4)

## 2017-05-25 MED ORDER — POTASSIUM CHLORIDE CRYS CR 20 MEQ OR TBCR
20.00 meq | EXTENDED_RELEASE_TABLET | Freq: Once | ORAL | Status: AC
Start: 2017-05-25 — End: 2017-05-25
  Administered 2017-05-25: 20 meq via ORAL
  Filled 2017-05-25: qty 1

## 2017-05-25 NOTE — Progress Notes (Signed)
**Note Diana-Identified via Obfuscation** Diana Chambers NOTE     Current Hospital Stay:   9 days - Admitted on: 05/15/2017    ID: 67 year old F with 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 failure on 3Lpm now here with worsening LEx edema, AKI, now on dopamine    24 Hour Events & Subjective  Continues to improve today subjectively  Denies SOB  Swelling decreasing in LE but still has significant amount of fluid  I/O -1.4L  Creatinine down to 2.25 from 2.6  Continued on dopamine 35mg/kg/min    Review of Systems:   All other systems were reviewed and are negative except as noted above    Current Medications:   furosemide  100 mg TID    medroxyPROGESTERone  1.25 mg Q24H NR    nystatin  5 mL 4x Daily    Prostacyclin Cassette Change   Daily    Prostacyclin Tubing   Once per day on Mon Wed Fri    Prostacylin Batteries   Once per day on Mon    sildenafil  80 mg TID    sodium chloride (PF)  3 mL Q8H    spironolactone  75 mg Daily      DOPamine 2 mcg/kg/min (05/25/17 0430)    epoprostenol (FLOLAN) infusion 35 ng/kg/min (05/24/17 1427)    sodium chloride        loperamide  2 mg TID PRN    nalOXone  0.1 mg Q2 Min PRN    sodium chloride (PF)  3 mL PRN    sodium chloride   Continuous PRN       Vital Signs:  Temperature:  [98 F (36.7 C)-98.6 F (37 C)] 98.2 F (36.8 C) (05/13 0731)  Blood pressure (BP): (105-138)/(54-74) 138/74 (05/13 0731)  Heart Rate:  [87-95] 93 (05/13 0731)  Respirations:  [16-18] 16 (05/13 0731)  Pain Score: 0 (05/13 0731)  O2 Device: Nasal cannula (05/13 0731)  O2 Flow Rate (L/min):  [3 l/min] 3 l/min (05/13 0731)  SpO2:  [93 %-99 %] 98 % (05/13 0731)  Wt Readings from Last 1 Encounters:   05/25/17 66.3 kg (146 lb 2.6 oz)     Intake/Output (Current Shift):  05/12 0600 - 05/13 0559  In: 429.8 [P.O.:240; I.V.:189.8]  Out: 139[Urine:1900]  Stool x2    Physical Exam:  Gen: A&O, conversing comfortably  Eyes: EOMI, pupils equal, anicteric  HENT: Atraumatic, normocephalic  Neck: + JVD,  trachea midline  CV: RRR, +M, accentuated S2, No R/G/S3. L tunneled IJ CVC site is CDI  Resp: stable bibasilar crackles, no intercostal retractions/normal effort  Abdo: NT mild D, soft, no masses.  Ext: +2 BLEx edema  Neuro: No focal weakness or sensory deficit, CN grossly intact  Psych: Appropriate affect and mood, good insight. AnOx3  Skin: Normal temperature, no rash or nodules. Diffuse erythema, likely related to IV Epo; + brusing.     LABS:  Lab Results   Component Value Date    NA 134 (L) 05/25/2017    K 3.6 05/25/2017    CL 102 05/25/2017    BICARB 18 (L) 05/25/2017    BUN 89 (H) 05/25/2017    CREAT 2.26 (H) 05/25/2017    GLU 83 05/25/2017    [place] 8.5 05/25/2017     Lab Results   Component Value Date    WBC 4.1 05/25/2017    HGB 9.4 (L) 05/25/2017    HCT 30.4 (L) 05/25/2017  PLT 55 (L) 05/25/2017    SEG 62 05/25/2017    LYMPHS 26 05/25/2017    MONOS 8 05/25/2017    EOS 1 05/25/2017     No results found for: AST, ALT, ALK, TBILI, DBILI, TP, ALB    Chest Xray: 05/16/17  Enlargement of the cardiac silhouette from the prior chest radiograph which may related to cardiac chamber enlargement/increased intravascular volume but pericardial effusion cannot be excluded.  Sequela of portal hypertension. Query mild interstitial pulmonary edema and small right pleural effusion.    TTE 05/16/17  1. The right ventricular size is severely enlarged and systolic function is reduced.  2. Severe pulmonary hypertension with RVSP measuring 100 mmHg.  3. The left ventricular size is normal and the left ventricular systolic function is normal.  4. Mild left ventricular hypertrophy.  5. Severely dilated right atrium.  6. Moderate tricuspid regurgitation.  7. Mild aortic stenosis with mild regurgitation.  8. Moderate Pulmonic regurgitation.  9. Severely dilated Pulmonary artery.  10. Compared to prior study estimated RVSP is elevated, was 75 mmHg and now 100 mmHg.    ASSESSMENT AND PLAN  67 year old woman with h/o PAH in the  setting of CTD (on PDE5i and IV Epo) and chronic hypoxemic resp. Failure on 3Lpm now here with worsening LE edema.    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   - Continue Dopamine 2 mcg/kg/min to help with diuresis  - Continue home sildenafil and Flolan at 35 ng/kg/min  - Continue home oxygen  - Continue holding digoxin for now since on dopamine. Levels came down.   - Holding steroids for CTD due to patient request (makes me feel "crazy")    This patient was seen and discussed with my attending physician, Dr. [name].    Doneen Poisson, MD  Fellow, Pulmonary and [place]  Office Phone: [phone]  Pager: webpaging    Pulmonary Vascular Attending Addendum:  Patient was examined withDr. Madelynn Done, imaging and tests reviewed.  Please see above note for full details and plan.    67 yo F with WHO I PAH associated with SLE and prior anorexogen use admitted with acute on chronic right heart failure and volume overload as well as AKI and thrombocytopenia.Persistent edema.    -Continue IV diuretics for negative volume status  -Continuedopamine for RV Support diuresis  -Continue sildenafil at home dose  -Follow thrombocytopenia    [name] MD  Pager [phone]

## 2017-05-25 NOTE — Plan of Care (Signed)
Problem: Promotion of Health and Safety  Goal: Promotion of Health and Safety  The patient remains safe, receives appropriate treatment and achieves optimal outcomes (physically, psychosocially, and spiritually) within the limitations of the disease process by discharge.    Information below is the current care plan.  Outcome: Progressing   05/23/17 0106 05/23/17 1425 05/24/17 1926   Patient /Family stated Goal   Patient /Family stated Goal --  --  go for a walk   Adult/Peds Plan of Care   Guidelines --  --  --    Individualized Interventions/Recommendations #1 --  Monitor vital signs and hemodynamics --    Individualized Interventions/Recommendations #2 (if applicable) --  Monitor Flolan infusion: dose, site, tubing, flow rate. --    Individualized Interventions/Recommendations #3 (if applicable) --  Encourage and assist with ambulation --    Individualized Interventions/Recommendations #4 (if applicable) --  Ensure and monitor strict intake and output --    Individualized Interventions/Recommendations #5 (if applicable) Instruct pt to call for assistance whenever she needed to go to the bathroom. Education pt re: safety precautions and fall prevention.  --  --    Outcome Evaluation (rationale for progressing/not progressing) every shift --  --  --     05/25/17 0656   Patient /Family stated Goal   Patient /Family stated Goal --    Adult/Peds Plan of Care   Guidelines Inpatient Nursing Guidelines   Individualized Interventions/Recommendations #1 --    Individualized Interventions/Recommendations #2 (if applicable) --    Individualized Interventions/Recommendations #3 (if applicable) --    Individualized Interventions/Recommendations #4 (if applicable) --    Individualized Interventions/Recommendations #5 (if applicable) --    Outcome Evaluation (rationale for progressing/not progressing) every shift Pt Aox4, VSS, urine output 350cc for shift. MD notified, given IV lasix this AM. bladderscanned for ? retention.  inaccurate measurements due to ascites. edema in BLE. SR w 1HB. flolan infusing, OTA dressing. Dopamine gtt infusing 22mcg/kg/min. pt ambulating w min assistance.

## 2017-05-25 NOTE — Plan of Care (Signed)
Problem: Promotion of Health and Safety  Goal: Promotion of Health and Safety  The patient remains safe, receives appropriate treatment and achieves optimal outcomes (physically, psychosocially, and spiritually) within the limitations of the disease process by discharge.    Information below is the current care plan.  Outcome: Progressing   05/23/17 0106 05/23/17 1425 05/24/17 1926   Patient /Family stated Goal   Patient /Family stated Goal --  --  go for a walk   Adult/Peds Plan of Care   Guidelines --  --  --    Individualized Interventions/Recommendations #1 --  Monitor vital signs and hemodynamics --    Individualized Interventions/Recommendations #2 (if applicable) --  Monitor Flolan infusion: dose, site, tubing, flow rate. --    Individualized Interventions/Recommendations #3 (if applicable) --  Encourage and assist with ambulation --    Individualized Interventions/Recommendations #4 (if applicable) --  Ensure and monitor strict intake and output --    Individualized Interventions/Recommendations #5 (if applicable) Instruct pt to call for assistance whenever she needed to go to the bathroom. Education pt re: safety precautions and fall prevention.  --  --    Outcome Evaluation (rationale for progressing/not progressing) every shift --  --  --     05/25/17 0656 05/25/17 1112   Patient /Family stated Goal   Patient /Family stated Goal --  --    Adult/Peds Plan of Care   Guidelines Inpatient Nursing Guidelines --    Individualized Interventions/Recommendations #1 --  --    Individualized Interventions/Recommendations #2 (if applicable) --  --    Individualized Interventions/Recommendations #3 (if applicable) --  --    Individualized Interventions/Recommendations #4 (if applicable) --  --    Individualized Interventions/Recommendations #5 (if applicable) --  --    Outcome Evaluation (rationale for progressing/not progressing) every shift --  VSS, Continue Flolan infusion and Dopamine, ambulated with son this AM  with steady gait, O2 at 3l/min via NC satting 96%, pt. calls for assistance

## 2017-05-26 LAB — CBC WITH DIFF, BLOOD
ANC-Automated: 2.2 10*3/uL (ref 1.6–7.0)
Abs Basophils: 0 10*3/uL (ref <0.1)
Abs Eosinophils: 0 10*3/uL (ref 0.1–0.5)
Abs Lymphs: 1 10*3/uL (ref 0.8–3.1)
Abs Monos: 0.2 10*3/uL (ref 0.2–0.8)
Basophils: 1 %
Eosinophils: 1 %
Hct: 29.7 % — ABNORMAL LOW (ref 34.0–45.0)
Hgb: 9 gm/dL — ABNORMAL LOW (ref 11.2–15.7)
Imm Gran %: 3 % — ABNORMAL HIGH (ref <1)
Imm Gran Abs: 0.1 10*3/uL (ref <0.1)
Lymphocytes: 28 %
MCH: 24.3 pg — ABNORMAL LOW (ref 26.0–32.0)
MCHC: 30.3 g/dL — ABNORMAL LOW (ref 32.0–36.0)
MCV: 80.3 um3 (ref 79.0–95.0)
Monocytes: 7 %
Plt Count: 53 10*3/uL — ABNORMAL LOW (ref 140–370)
RBC: 3.7 10*6/uL — ABNORMAL LOW (ref 3.90–5.20)
RDW: 19.1 % — ABNORMAL HIGH (ref 12.0–14.0)
Segs: 60 %
WBC: 3.7 10*3/uL — ABNORMAL LOW (ref 4.0–10.0)

## 2017-05-26 LAB — BASIC METABOLIC PANEL, BLOOD
Anion Gap: 14 mmol/L (ref 7–15)
BUN: 88 mg/dL — ABNORMAL HIGH (ref 8–23)
Bicarbonate: 19 mmol/L — ABNORMAL LOW (ref 22–29)
Calcium: 8.3 mg/dL — ABNORMAL LOW (ref 8.5–10.6)
Chloride: 102 mmol/L (ref 98–107)
Creatinine: 2.14 mg/dL — ABNORMAL HIGH (ref 0.51–0.95)
GFR: 23 mL/min
Glucose: 89 mg/dL (ref 70–99)
Potassium: 3.6 mmol/L (ref 3.5–5.1)
Sodium: 135 mmol/L — ABNORMAL LOW (ref 136–145)

## 2017-05-26 LAB — MAGNESIUM, BLOOD: Magnesium: 2.3 mg/dL (ref 1.6–2.4)

## 2017-05-26 LAB — PHOSPHORUS, BLOOD: Phosphorous: 5.1 mg/dL — ABNORMAL HIGH (ref 2.7–4.5)

## 2017-05-26 MED ORDER — CHLOROTHIAZIDE SODIUM 500 MG IV SOLR
500.00 mg | Freq: Once | INTRAVENOUS | Status: AC
Start: 2017-05-26 — End: 2017-05-26
  Administered 2017-05-26: 500 mg via INTRAVENOUS
  Filled 2017-05-26: qty 18

## 2017-05-26 MED ORDER — ACETAMINOPHEN 325 MG PO TABS
650.00 mg | ORAL_TABLET | Freq: Four times a day (QID) | ORAL | Status: DC | PRN
Start: 2017-05-26 — End: 2017-07-15
  Administered 2017-05-27 – 2017-07-15 (×40): 650 mg via ORAL
  Filled 2017-05-26 (×41): qty 2

## 2017-05-26 MED ORDER — POTASSIUM CHLORIDE CRYS CR 20 MEQ OR TBCR
20.00 meq | EXTENDED_RELEASE_TABLET | Freq: Once | ORAL | Status: AC
Start: 2017-05-26 — End: 2017-05-26
  Administered 2017-05-26: 20 meq via ORAL
  Filled 2017-05-26: qty 1

## 2017-05-26 NOTE — Plan of Care (Signed)
Problem: Promotion of Health and Safety  Goal: Promotion of Health and Safety  The patient remains safe, receives appropriate treatment and achieves optimal outcomes (physically, psychosocially, and spiritually) within the limitations of the disease process by discharge.    Information below is the current care plan.  Outcome: Progressing   05/23/17 0106 05/23/17 1425 05/25/17 0656   Patient /Family stated Goal   Patient /Family stated Goal --  --  --    Adult/Peds Plan of Care   Guidelines --  --  Inpatient Nursing Guidelines   Individualized Interventions/Recommendations #1 --  Monitor vital signs and hemodynamics --    Individualized Interventions/Recommendations #2 (if applicable) --  Monitor Flolan infusion: dose, site, tubing, flow rate. --    Individualized Interventions/Recommendations #3 (if applicable) --  Encourage and assist with ambulation --    Individualized Interventions/Recommendations #4 (if applicable) --  Ensure and monitor strict intake and output --    Individualized Interventions/Recommendations #5 (if applicable) Instruct pt to call for assistance whenever she needed to go to the bathroom. Education pt re: safety precautions and fall prevention.  --  --    Outcome Evaluation (rationale for progressing/not progressing) every shift --  --  --     05/26/17 1000   Patient /Family stated Goal   Patient /Family stated Goal Wants to do strengthening exercises   Adult/Peds Plan of Care   Guidelines --    Individualized Interventions/Recommendations #1 --    Individualized Interventions/Recommendations #2 (if applicable) --    Individualized Interventions/Recommendations #3 (if applicable) --    Individualized Interventions/Recommendations #4 (if applicable) --    Individualized Interventions/Recommendations #5 (if applicable) --    Outcome Evaluation (rationale for progressing/not progressing) every shift VSS, Flolan continues at 72/24hrs. Dopamine drip at 2 ,cg/kg/min, ambulates in the room with  stand by assist, lifting leg independently when sitting up in chair, pt. calls for help

## 2017-05-26 NOTE — Progress Notes (Signed)
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 failure on 3Lpm now here with worsening LEx edema, AKI, now on dopamine    24 Hour Events & Subjective  Continues to improve today subjectively  Denies SOB  Swelling decreasing in LE but still has significant amount of fluid  I/O -600 yesterday  Creatinine down to 2.25 from 2.6  Continued on dopamine 17mg/kg/min    Review of Systems:   All other systems were reviewed and are negative except as noted above    Current Medications:   furosemide  100 mg TID    medroxyPROGESTERone  1.25 mg Q24H NR    nystatin  5 mL 4x Daily    Prostacyclin Cassette Change   Daily    Prostacyclin Tubing   Once per day on Mon Wed Fri    Prostacylin Batteries   Once per day on Mon    sildenafil  80 mg TID    sodium chloride (PF)  3 mL Q8H    spironolactone  75 mg Daily      DOPamine 2 mcg/kg/min (05/26/17 0716)    epoprostenol (FLOLAN) infusion 35 ng/kg/min (05/26/17 0717)    sodium chloride        loperamide  2 mg TID PRN    nalOXone  0.1 mg Q2 Min PRN    sodium chloride (PF)  3 mL PRN    sodium chloride   Continuous PRN       Vital Signs:  Temperature:  [98 F (36.7 C)-98.2 F (36.8 C)] 98.2 F (36.8 C) (05/14 0715)  Blood pressure (BP): (107-141)/(54-75) 126/65 (05/14 0715)  Heart Rate:  [83-92] 89 (05/14 0715)  Respirations:  [16-18] 16 (05/14 0715)  Pain Score: 0 (05/14 0715)  O2 Device: Nasal cannula (05/14 0817)  O2 Flow Rate (L/min):  [3 l/min] 3 l/min (05/14 0817)  SpO2:  [95 %-100 %] 98 % (05/14 0817)  Wt Readings from Last 1 Encounters:   05/26/17 66.7 kg (147 lb 1.6 oz)     Intake/Output (Current Shift):  05/13 0600 - 05/14 0559  In: 927.9 [P.O.:740; I.V.:187.9]  Out: 1550 [Urine:1550]  Stool x2 today    Physical Exam:  Gen: A&O, conversing comfortably  Eyes: EOMI, pupils equal, anicteric  HENT: Atraumatic,  normocephalic  Neck: + JVD, trachea midline  CV: RRR, +M, accentuated S2, No R/G/S3. L tunneled IJ CVC site is CDI  Resp: stable bibasilar crackles, no intercostal retractions/normal effort  Abdo: NT mild D, soft, no masses.  Ext: +2 BLEx edema  Neuro: No focal weakness or sensory deficit, CN grossly intact  Psych: Appropriate affect and mood, good insight. AnOx3  Skin: Normal temperature, no rash or nodules. Diffuse erythema, likely related to IV Epo; + brusing.     LABS:  Lab Results   Component Value Date    NA 135 (L) 05/26/2017    K 3.6 05/26/2017    CL 102 05/26/2017    BICARB 19 (L) 05/26/2017    BUN 88 (H) 05/26/2017    CREAT 2.14 (H) 05/26/2017    GLU 89 05/26/2017    [place] 8.3 (L) 05/26/2017     Lab Results   Component Value Date    WBC 3.7 (L) 05/26/2017    HGB 9.0 (L) 05/26/2017    HCT  29.7 (L) 05/26/2017    PLT 53 (L) 05/26/2017    SEG 60 05/26/2017    LYMPHS 28 05/26/2017    MONOS 7 05/26/2017    EOS 1 05/26/2017     No results found for: AST, ALT, ALK, TBILI, DBILI, TP, ALB    Chest Xray: 05/16/17  Enlargement of the cardiac silhouette from the prior chest radiograph which may related to cardiac chamber enlargement/increased intravascular volume but pericardial effusion cannot be excluded.  Sequela of portal hypertension. Query mild interstitial pulmonary edema and small right pleural effusion.    TTE 05/16/17  1. The right ventricular size is severely enlarged and systolic function is reduced.  2. Severe pulmonary hypertension with RVSP measuring 100 mmHg.  3. The left ventricular size is normal and the left ventricular systolic function is normal.  4. Mild left ventricular hypertrophy.  5. Severely dilated right atrium.  6. Moderate tricuspid regurgitation.  7. Mild aortic stenosis with mild regurgitation.  8. Moderate Pulmonic regurgitation.  9. Severely dilated Pulmonary artery.  10. Compared to prior study estimated RVSP is elevated, was 75 mmHg and now 100 mmHg.    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 Failure on 3Lpm now here with worsening LE edema.    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   - Continue Dopamine 2 mcg/kg/min to help with diuresis  - Continue home sildenafil and Flolan at 35 ng/kg/min  - Continue home oxygen  - Continue holding digoxin for now since on dopamine. Levels came down.   - Holding steroids for CTD due to patient request (makes me feel "crazy")    This patient was seen and discussed with my attending physician, Dr. [name].    Doneen Poisson, MD  Fellow, Pulmonary and [place]  Office Phone: [phone]  Pager: webpaging    Pulmonary Vascular Attending Addendum:  Patient was examined withDr. Madelynn Done, imaging and tests reviewed.  Please see above note for full details and plan.    67 yo F with WHO I PAH associated with SLE and prior anorexogen use admitted with acute on chronic right heart failure and volume overload as well as AKI and thrombocytopenia.Persistent edema.    -Continue IV diuretics for negative volume status  -Continuedopamine for RV Support diuresis  -Continue sildenafil at home dose  -Follow thrombocytopenia    [name] MD  Pager [phone]

## 2017-05-27 LAB — CBC WITH DIFF, BLOOD
ANC-Automated: 2.6 10*3/uL (ref 1.6–7.0)
Abs Basophils: 0 10*3/uL (ref <0.1)
Abs Eosinophils: 0 10*3/uL (ref 0.1–0.5)
Abs Lymphs: 1.1 10*3/uL (ref 0.8–3.1)
Abs Monos: 0.3 10*3/uL (ref 0.2–0.8)
Basophils: 1 %
Eosinophils: 1 %
Hct: 30.5 % — ABNORMAL LOW (ref 34.0–45.0)
Hgb: 9.3 gm/dL — ABNORMAL LOW (ref 11.2–15.7)
Imm Gran %: 3 % — ABNORMAL HIGH (ref <1)
Imm Gran Abs: 0.1 10*3/uL (ref <0.1)
Lymphocytes: 26 %
MCH: 24.7 pg — ABNORMAL LOW (ref 26.0–32.0)
MCHC: 30.5 g/dL — ABNORMAL LOW (ref 32.0–36.0)
MCV: 80.9 um3 (ref 79.0–95.0)
Monocytes: 7 %
Plt Count: 57 10*3/uL — ABNORMAL LOW (ref 140–370)
RBC: 3.77 10*6/uL — ABNORMAL LOW (ref 3.90–5.20)
RDW: 19 % — ABNORMAL HIGH (ref 12.0–14.0)
Segs: 63 %
WBC: 4.1 10*3/uL (ref 4.0–10.0)

## 2017-05-27 LAB — PHOSPHORUS, BLOOD: Phosphorous: 5.4 mg/dL — ABNORMAL HIGH (ref 2.7–4.5)

## 2017-05-27 LAB — BASIC METABOLIC PANEL, BLOOD
Anion Gap: 16 mmol/L — ABNORMAL HIGH (ref 7–15)
BUN: 89 mg/dL — ABNORMAL HIGH (ref 8–23)
Bicarbonate: 18 mmol/L — ABNORMAL LOW (ref 22–29)
Calcium: 8.3 mg/dL — ABNORMAL LOW (ref 8.5–10.6)
Chloride: 96 mmol/L — ABNORMAL LOW (ref 98–107)
Creatinine: 0.67 mg/dL (ref 0.51–0.95)
GFR: >60 mL/min
Glucose: 172 mg/dL — ABNORMAL HIGH (ref 70–99)
Potassium: 3.4 mmol/L — ABNORMAL LOW (ref 3.5–5.1)
Sodium: 130 mmol/L — ABNORMAL LOW (ref 136–145)

## 2017-05-27 LAB — MAGNESIUM, BLOOD: Magnesium: 2.3 mg/dL (ref 1.6–2.4)

## 2017-05-27 LAB — DIGOXIN, BLOOD: Digoxin: 0.5 ng/mL — ABNORMAL LOW (ref 0.6–1.2)

## 2017-05-27 MED ORDER — POTASSIUM CHLORIDE CRYS CR 20 MEQ OR TBCR
20.00 meq | EXTENDED_RELEASE_TABLET | Freq: Once | ORAL | Status: AC
Start: 2017-05-27 — End: 2017-05-27
  Administered 2017-05-27: 20 meq via ORAL
  Filled 2017-05-27 (×2): qty 1

## 2017-05-27 MED ORDER — POTASSIUM CHLORIDE CRYS CR 20 MEQ OR TBCR
20.00 meq | EXTENDED_RELEASE_TABLET | Freq: Once | ORAL | Status: AC
Start: 2017-05-27 — End: 2017-05-27
  Administered 2017-05-27: 20 meq via ORAL

## 2017-05-27 NOTE — Plan of Care (Signed)
Problem: Promotion of Health and Safety  Goal: Promotion of Health and Safety  The patient remains safe, receives appropriate treatment and achieves optimal outcomes (physically, psychosocially, and spiritually) within the limitations of the disease process by discharge.    Information below is the current care plan.  Outcome: Progressing   05/26/17 2000 05/27/17 0519   Patient /Family stated Goal   Patient /Family stated Goal keep peeing --    Adult/Peds Plan of Care   Guidelines --  Inpatient Nursing Guidelines   Individualized Interventions/Recommendations #1 --  VS monitoring, monitor SPO2   Individualized Interventions/Recommendations #2 (if applicable) --  flolan infusion   Individualized Interventions/Recommendations #3 (if applicable) --  encourage ambulating around unit   Individualized Interventions/Recommendations #4 (if applicable) --  strict I&Os, qd weight   Individualized Interventions/Recommendations #5 (if applicable) --  dopamine infusion   Outcome Evaluation (rationale for progressing/not progressing) every shift --  Pt AOx4, VSS. SR 1HB, still gaining weight, AUO, dopamine at 2mg /kg. generalized reddness from flolan on skin, maintain skin integrity. Monitro for any resp distress. None noted. BM overnight. iV lasix. K replacement

## 2017-05-27 NOTE — Progress Notes (Signed)
Pulmonary Vascular Attending Addendum:  Patient was examined withDr. Erie Noe  Labs, imaging and tests reviewed.  Please see above note for full details and plan.    67 yo F with WHO I PAH associated with SLE and prior anorexogen use admitted with acute on chronic right heart failure and volume overload as well as AKI and thrombocytopenia.Persistent edema.    -Continue IV diuretics for negative volume status  -Continuedopamine for RV Support diuresis  -Continue sildenafil at home dose  -Follow thrombocytopenia    [name] MD  Pager [phone]

## 2017-05-27 NOTE — Interdisciplinary (Signed)
05/27/17 1552   Follow Up/Progress   Is the Patient Ready for Discharge No   Supplies/Services  IV infusion/antibiotics   Barriers to Discharge Awaiting clinical improvement   Patient/Family/Legal/Surrogate Decision Maker Has Been Given Options And Choice In The Selection of Post-Acute Care Providers Yes   Family/Caregiver's Assessed for Readiness, willingness, and ability to provide or support self-management activities   Respite Care Not Applicable   Patient/Family Are In Agreement With Discharge Plan Yes   Public Health Clearance Needed Not Applicable   Plan/Interventions Explore needs and options for aftercare, provide referrals;Ongoing weekly therapeutic interventions     Medical necessity Reason for continued Hospital Stay:   acute on chronic right heart failure and volume overload as well as AKI and thrombocytopenia.Persistent edema.  ?  Interventions requiring continued Hospitalization/Plan of Care:   - Lasix IVP diuresis ongoing  - Dopamine gtt @ 41mcg/kg/min  - Continuous supplemental O2 @ 3L per NC   ?  Anticipated discharge plan at this time (home, SNF, etc.):  Home w/ HH  ?  Barriers to Discharge:  None.   ?  Expected DC Date:   TBD.    Pt is currently on service with [employer], for Flolan gtt.  Pt on service with [employer], for Oxygen DME.  CM to confirm service resumption with both companies, upon discharge.  CM to follow clinical course and assess for any other DC needs.

## 2017-05-27 NOTE — Progress Notes (Signed)
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 failure on 3Lpm now here with worsening LEx edema, AKI, now on dopamine    24 Hour Events & Subjective  Continues to improve today subjectively  Denies SOB  Swelling decreasing in LE but still has significant amount of fluid  I/O -1L yesterday, given diuril.   Creatinine down to 2.25 from 2.6, this AM 0.67 (unlikely real).   Continued on dopamine 84mg/kg/min    Review of Systems:   All other systems were reviewed and are negative except as noted above    Current Medications:   furosemide  100 mg TID    medroxyPROGESTERone  1.25 mg Q24H NR    nystatin  5 mL 4x Daily    Prostacyclin Cassette Change   Daily    Prostacyclin Tubing   Once per day on Mon Wed Fri    Prostacylin Batteries   Once per day on Mon    sildenafil  80 mg TID    sodium chloride (PF)  3 mL Q8H    spironolactone  75 mg Daily      DOPamine 2 mcg/kg/min (05/27/17 1237)    epoprostenol (FLOLAN) infusion 35 ng/kg/min (05/27/17 1425)    sodium chloride        acetaminophen  650 mg Q6H PRN    loperamide  2 mg TID PRN    nalOXone  0.1 mg Q2 Min PRN    sodium chloride (PF)  3 mL PRN    sodium chloride   Continuous PRN       Vital Signs:  Temperature:  [97.8 F (36.6 C)-98.5 F (36.9 C)] 98.5 F (36.9 C) (05/15 1513)  Blood pressure (BP): (117-125)/(58-69) 120/64 (05/15 1513)  Heart Rate:  [86-92] 89 (05/15 1513)  Respirations:  [17-20] 20 (05/15 1513)  Pain Score: 0 (05/15 1513)  O2 Device: Nasal cannula (05/15 1513)  O2 Flow Rate (L/min):  [3 l/min] 3 l/min (05/15 1513)  SpO2:  [96 %-100 %] 97 % (05/15 1513)  Wt Readings from Last 1 Encounters:   05/27/17 67 kg (147 lb 9.6 oz)     Intake/Output (Current Shift):  05/14 0600 - 05/15 0559  In: 985.9 [P.O.:790; I.V.:195.9]  Out: 2100 [Urine:2100]  Stool x5 today    Physical Exam:  Gen: A&O, conversing  comfortably  Eyes: EOMI, pupils equal, anicteric  HENT: Atraumatic, normocephalic  Neck: + JVD, trachea midline  CV: RRR, +M, accentuated S2, No R/G/S3. L tunneled IJ CVC site is CDI  Resp: stable bibasilar crackles, no intercostal retractions/normal effort  Abdo: NT mild D, soft, no masses.  Ext: +2 BLEx edema  Neuro: No focal weakness or sensory deficit, CN grossly intact  Psych: Appropriate affect and mood, good insight. AnOx3  Skin: Normal temperature, no rash or nodules. Diffuse erythema, likely related to IV Epo; + brusing.     LABS:  Lab Results   Component Value Date    NA 130 (L) 05/27/2017    K 3.4 (L) 05/27/2017    CL 96 (L) 05/27/2017    BICARB 18 (L) 05/27/2017    BUN 89 (H) 05/27/2017    CREAT 0.67 05/27/2017    GLU 172 (H) 05/27/2017    [place] 8.3 (L) 05/27/2017     Lab Results   Component  Value Date    WBC 4.1 05/27/2017    HGB 9.3 (L) 05/27/2017    HCT 30.5 (L) 05/27/2017    PLT 57 (L) 05/27/2017    SEG 63 05/27/2017    LYMPHS 26 05/27/2017    MONOS 7 05/27/2017    EOS 1 05/27/2017     No results found for: AST, ALT, ALK, TBILI, DBILI, TP, ALB    Chest Xray: 05/16/17  Enlargement of the cardiac silhouette from the prior chest radiograph which may related to cardiac chamber enlargement/increased intravascular volume but pericardial effusion cannot be excluded.  Sequela of portal hypertension. Query mild interstitial pulmonary edema and small right pleural effusion.    TTE 05/16/17  1. The right ventricular size is severely enlarged and systolic function is reduced.  2. Severe pulmonary hypertension with RVSP measuring 100 mmHg.  3. The left ventricular size is normal and the left ventricular systolic function is normal.  4. Mild left ventricular hypertrophy.  5. Severely dilated right atrium.  6. Moderate tricuspid regurgitation.  7. Mild aortic stenosis with mild regurgitation.  8. Moderate Pulmonic regurgitation.  9. Severely dilated Pulmonary artery.  10. Compared to prior study estimated RVSP is  elevated, was 75 mmHg and now 100 mmHg.    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 Failure on 3Lpm now here with worsening LE edema.    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   - Continue Dopamine 2 mcg/kg/min to help with diuresis   - Continue home sildenafil and Flolan at 35 ng/kg/min  - Continue home oxygen  - Continue holding digoxin for now since on dopamine. Levels came down.   - Holding steroids for CTD due to patient request (makes me feel "crazy")    This patient was seen and discussed with my attending physician, Dr. [name].    Doneen Poisson, MD  Fellow, Pulmonary and [place]  Office Phone: [phone]  Pager: webpaging

## 2017-05-28 LAB — BASIC METABOLIC PANEL, BLOOD
Anion Gap: 14 mmol/L (ref 7–15)
BUN: 88 mg/dL — ABNORMAL HIGH (ref 8–23)
Bicarbonate: 21 mmol/L — ABNORMAL LOW (ref 22–29)
Calcium: 8.5 mg/dL (ref 8.5–10.6)
Chloride: 98 mmol/L (ref 98–107)
Creatinine: 2.31 mg/dL — ABNORMAL HIGH (ref 0.51–0.95)
GFR: 21 mL/min
Glucose: 85 mg/dL (ref 70–99)
Potassium: 3.7 mmol/L (ref 3.5–5.1)
Sodium: 133 mmol/L — ABNORMAL LOW (ref 136–145)

## 2017-05-28 LAB — MAGNESIUM, BLOOD: Magnesium: 2.4 mg/dL (ref 1.6–2.4)

## 2017-05-28 LAB — PHOSPHORUS, BLOOD: Phosphorous: 5.4 mg/dL — ABNORMAL HIGH (ref 2.7–4.5)

## 2017-05-28 MED ORDER — SODIUM CHLORIDE 0.9 % IV SOLN
100.00 mg | Freq: Four times a day (QID) | INTRAVENOUS | Status: DC
Start: 2017-05-28 — End: 2017-06-03
  Administered 2017-05-28 – 2017-06-03 (×24): 100 mg via INTRAVENOUS
  Filled 2017-05-28 (×30): qty 10

## 2017-05-28 MED ORDER — CHLOROTHIAZIDE SODIUM 500 MG IV SOLR
500.00 mg | Freq: Once | INTRAVENOUS | Status: AC
Start: 2017-05-28 — End: 2017-05-28
  Administered 2017-05-28 (×2): 500 mg via INTRAVENOUS
  Filled 2017-05-28: qty 18

## 2017-05-28 MED ORDER — FUROSEMIDE 10 MG/ML IJ SOLN
100.00 mg | Freq: Four times a day (QID) | INTRAMUSCULAR | Status: DC
Start: 2017-05-28 — End: 2017-05-28

## 2017-05-28 NOTE — Plan of Care (Signed)
Problem: Promotion of Health and Safety  Goal: Promotion of Health and Safety  The patient remains safe, receives appropriate treatment and achieves optimal outcomes (physically, psychosocially, and spiritually) within the limitations of the disease process by discharge.    Information below is the current care plan.  Outcome: Progressing   05/27/17 0519 05/28/17 0341   Adult/Peds Plan of Care   Guidelines --  Inpatient Nursing Guidelines   Individualized Interventions/Recommendations #1 VS monitoring, monitor SPO2 --    Individualized Interventions/Recommendations #2 (if applicable) flolan infusion --    Individualized Interventions/Recommendations #3 (if applicable) encourage ambulating around unit --    Individualized Interventions/Recommendations #4 (if applicable) strict I&Os, qd weight --    Individualized Interventions/Recommendations #5 (if applicable) --  Continue Dopamine drip for renal dose.   Outcome Evaluation (rationale for progressing/not progressing) every shift --  Pt alert and oriented x 4, generalized edema, strict I/O, Tele SR. No signs of respiratory distress noted. Continue on flolan and dopamine drip,

## 2017-05-28 NOTE — Progress Notes (Signed)
Pulmonary Vascular Attending Addendum:  Patient was examined withDr. Erie Noe  Labs, imaging and tests reviewed.  Please see above note for full details and plan.    67 yo F with WHO I PAH associated with SLE and prior anorexogen use admitted with acute on chronic right heart failure and volume overload as well as AKI and thrombocytopenia.Persistent edema.    -Continue IV lasix and diuril for negative volume status  -Continuedopamine for RV Support diuresis  -Continue sildenafil at home dose  -Follow thrombocytopenia    [name] MD  Pager [phone]

## 2017-05-28 NOTE — Progress Notes (Signed)
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 failure on 3Lpm now here with worsening LEx edema, AKI, now on dopamine    24 Hour Events & Subjective  Continues to improve today subjectively  Denies SOB  Swelling decreasing in LE but still has significant amount of fluid  I/O -1.6L yesterday  Creatinine down to 2.3 from 2.6 (unlikely real).   Continued on dopamine 6mg/kg/min    Review of Systems:   All other systems were reviewed and are negative except as noted above    Current Medications:   chlorothiazide  500 mg Once    furosemide (LASIX) IVPB (doses > 80 mg)  100 mg 4x Daily    medroxyPROGESTERone  1.25 mg Q24H NR    nystatin  5 mL 4x Daily    Prostacyclin Cassette Change   Daily    Prostacyclin Tubing   Once per day on Mon Wed Fri    Prostacylin Batteries   Once per day on Mon    sildenafil  80 mg TID    sodium chloride (PF)  3 mL Q8H    spironolactone  75 mg Daily      DOPamine 2 mcg/kg/min (05/28/17 0720)    epoprostenol (FLOLAN) infusion 35 ng/kg/min (05/28/17 0720)    sodium chloride        acetaminophen  650 mg Q6H PRN    loperamide  2 mg TID PRN    nalOXone  0.1 mg Q2 Min PRN    sodium chloride (PF)  3 mL PRN    sodium chloride   Continuous PRN       Vital Signs:  Temperature:  [98 F (36.7 C)-98.5 F (36.9 C)] 98.1 F (36.7 C) (05/16 1200)  Blood pressure (BP): (116-134)/(61-72) 116/63 (05/16 1200)  Heart Rate:  [85-89] 86 (05/16 0800)  Respirations:  [18-20] 19 (05/16 1200)  Pain Score: 0 (05/16 1200)  O2 Device: Nasal cannula (05/16 1200)  O2 Flow Rate (L/min):  [3 l/min] 3 l/min (05/16 1200)  SpO2:  [96 %-99 %] 97 % (05/16 1200)  Wt Readings from Last 1 Encounters:   05/28/17 66.8 kg (147 lb 4.3 oz)     Intake/Output (Current Shift):  05/15 0600 - 05/16 0559  In: 956.8 [P.O.:860; I.V.:96.8]  Out: 2552 [Urine:2550]  Stool x4  today    Physical Exam:  Gen: A&O, conversing comfortably  Eyes: EOMI, pupils equal, anicteric  HENT: Atraumatic, normocephalic  Neck: + JVD, trachea midline  CV: RRR, +M, accentuated S2, No R/G/S3. L tunneled IJ CVC site is CDI  Resp: stable bibasilar crackles, no intercostal retractions/normal effort  Abdo: NT mild D, soft, no masses.  Ext: +2 BLEx edema  Neuro: No focal weakness or sensory deficit, CN grossly intact  Psych: Appropriate affect and mood, good insight. AnOx3  Skin: Normal temperature, no rash or nodules. Diffuse erythema, likely related to IV Epo; + brusing.     LABS:  Lab Results   Component Value Date    NA 133 (L) 05/28/2017    K 3.7 05/28/2017    CL 98 05/28/2017    BICARB 21 (L) 05/28/2017    BUN 88 (H) 05/28/2017    CREAT 2.31 (H) 05/28/2017    GLU 85 05/28/2017    [place] 8.5 05/28/2017  Lab Results   Component Value Date    WBC 4.1 05/27/2017    HGB 9.3 (L) 05/27/2017    HCT 30.5 (L) 05/27/2017    PLT 57 (L) 05/27/2017    SEG 63 05/27/2017    LYMPHS 26 05/27/2017    MONOS 7 05/27/2017    EOS 1 05/27/2017     No results found for: AST, ALT, ALK, TBILI, DBILI, TP, ALB    Chest Xray: 05/16/17  Enlargement of the cardiac silhouette from the prior chest radiograph which may related to cardiac chamber enlargement/increased intravascular volume but pericardial effusion cannot be excluded.  Sequela of portal hypertension. Query mild interstitial pulmonary edema and small right pleural effusion.    TTE 05/16/17  1. The right ventricular size is severely enlarged and systolic function is reduced.  2. Severe pulmonary hypertension with RVSP measuring 100 mmHg.  3. The left ventricular size is normal and the left ventricular systolic function is normal.  4. Mild left ventricular hypertrophy.  5. Severely dilated right atrium.  6. Moderate tricuspid regurgitation.  7. Mild aortic stenosis with mild regurgitation.  8. Moderate Pulmonic regurgitation.  9. Severely dilated Pulmonary artery.  10. Compared  to prior study estimated RVSP is elevated, was 75 mmHg and now 100 mmHg.    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 Failure on 3Lpm now here with worsening LE edema.    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 Goal I/O negative  - Continue Spironolactone.   - Continue Dopamine 2 mcg/kg/min to help with diuresis   - Continue home sildenafil and Flolan at 35 ng/kg/min  - Continue home oxygen  - Continue holding digoxin for now since on dopamine. Levels came down.   - Holding steroids for CTD due to patient request (makes me feel "crazy")    This patient was seen and discussed with my attending physician, Dr. [name].    Doneen Poisson, MD  Fellow, Pulmonary and [place]  Office Phone: [phone]  Pager: webpaging

## 2017-05-29 LAB — CBC WITH DIFF, BLOOD
ANC-Automated: 2.3 10*3/uL (ref 1.6–7.0)
Abs Basophils: 0 10*3/uL (ref <0.1)
Abs Eosinophils: 0 10*3/uL (ref 0.1–0.5)
Abs Lymphs: 0.9 10*3/uL (ref 0.8–3.1)
Abs Monos: 0.3 10*3/uL (ref 0.2–0.8)
Basophils: 1 %
Eosinophils: 1 %
Hct: 29.1 % — ABNORMAL LOW (ref 34.0–45.0)
Hgb: 9.4 gm/dL — ABNORMAL LOW (ref 11.2–15.7)
Imm Gran %: 4 % — ABNORMAL HIGH (ref <1)
Imm Gran Abs: 0.1 10*3/uL (ref <0.1)
Lymphocytes: 25 %
MCH: 25.5 pg — ABNORMAL LOW (ref 26.0–32.0)
MCHC: 32.3 g/dL (ref 32.0–36.0)
MCV: 78.9 um3 — ABNORMAL LOW (ref 79.0–95.0)
Monocytes: 8 %
Plt Count: 62 10*3/uL — ABNORMAL LOW (ref 140–370)
RBC: 3.69 10*6/uL — ABNORMAL LOW (ref 3.90–5.20)
RDW: 18.6 % — ABNORMAL HIGH (ref 12.0–14.0)
Segs: 62 %
WBC: 3.7 10*3/uL — ABNORMAL LOW (ref 4.0–10.0)

## 2017-05-29 LAB — BASIC METABOLIC PANEL, BLOOD
Anion Gap: 16 mmol/L — ABNORMAL HIGH (ref 7–15)
BUN: 88 mg/dL — ABNORMAL HIGH (ref 8–23)
Bicarbonate: 20 mmol/L — ABNORMAL LOW (ref 22–29)
Calcium: 8.4 mg/dL — ABNORMAL LOW (ref 8.5–10.6)
Chloride: 96 mmol/L — ABNORMAL LOW (ref 98–107)
Creatinine: 2.36 mg/dL — ABNORMAL HIGH (ref 0.51–0.95)
GFR: 21 mL/min
Glucose: 84 mg/dL (ref 70–99)
Potassium: 3.1 mmol/L — ABNORMAL LOW (ref 3.5–5.1)
Sodium: 132 mmol/L — ABNORMAL LOW (ref 136–145)

## 2017-05-29 LAB — MAGNESIUM, BLOOD: Magnesium: 2.3 mg/dL (ref 1.6–2.4)

## 2017-05-29 LAB — PHOSPHORUS, BLOOD: Phosphorous: 5.4 mg/dL — ABNORMAL HIGH (ref 2.7–4.5)

## 2017-05-29 MED ORDER — POTASSIUM CHLORIDE CRYS CR 20 MEQ OR TBCR
40.00 meq | EXTENDED_RELEASE_TABLET | Freq: Once | ORAL | Status: AC
Start: 2017-05-29 — End: 2017-05-29
  Administered 2017-05-29 (×2): 40 meq via ORAL
  Filled 2017-05-29: qty 2

## 2017-05-29 MED ORDER — CHLOROTHIAZIDE SODIUM 500 MG IV SOLR
500.00 mg | Freq: Once | INTRAVENOUS | Status: AC
Start: 2017-05-29 — End: 2017-05-29
  Administered 2017-05-29 (×2): 500 mg via INTRAVENOUS
  Filled 2017-05-29: qty 18

## 2017-05-29 NOTE — Progress Notes (Signed)
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 failure on 3Lpm now here with worsening LEx edema, AKI, now on dopamine    Review of Systems:   All other systems were reviewed and are negative except as noted above    Current Medications:   chlorothiazide  500 mg Once    furosemide (LASIX) IVPB (doses > 80 mg)  100 mg 4x Daily    medroxyPROGESTERone  1.25 mg Q24H NR    nystatin  5 mL 4x Daily    potassium chloride  40 mEq Once    Prostacyclin Cassette Change   Daily    Prostacyclin Tubing   Once per day on Mon Wed Fri    Prostacylin Batteries   Once per day on Mon    sildenafil  80 mg TID    sodium chloride (PF)  3 mL Q8H    spironolactone  75 mg Daily      DOPamine 2 mcg/kg/min (05/29/17 1030)    epoprostenol (FLOLAN) infusion 35 ng/kg/min (05/28/17 1700)    sodium chloride        acetaminophen  650 mg Q6H PRN    loperamide  2 mg TID PRN    nalOXone  0.1 mg Q2 Min PRN    sodium chloride (PF)  3 mL PRN    sodium chloride   Continuous PRN       Vital Signs:  Temperature:  [96.8 F (36 C)-98.2 F (36.8 C)] 98.2 F (36.8 C) (05/17 1154)  Blood pressure (BP): (103-117)/(50-73) 103/50 (05/17 1154)  Heart Rate:  [85-96] 94 (05/17 1018)  Respirations:  [8-19] 8 (05/17 1154)  Pain Score: 0 (05/17 1154)  O2 Device: Nasal cannula (05/17 1154)  O2 Flow Rate (L/min):  [3 l/min] 3 l/min (05/17 1154)  SpO2:  [94 %-99 %] 97 % (05/17 1154)  Wt Readings from Last 1 Encounters:   05/29/17 66.4 kg (146 lb 6.2 oz)     Intake/Output (Current Shift):  05/16 0600 - 05/17 0559  In: 1439.8 [P.O.:1250; I.V.:189.8]  Out: 2902 [Urine:2900]  Stool x4 today    Physical Exam:  Gen: A&O, conversing comfortably  Eyes: EOMI, pupils equal, anicteric  HENT: Atraumatic, normocephalic  Neck: + JVD, trachea midline  CV: RRR, +murmur, accentuated S2, No R/G/S3. L tunneled IJ CVC site is  CDI  Resp: stable bibasilar crackles, no intercostal retractions/normal effort  Abdo: NT mild D, soft, no masses.  Ext: +2 BLEx edema  Neuro: No focal weakness or sensory deficit, CN grossly intact  Psych: Appropriate affect and mood, good insight. AnOx3  Skin: Normal temperature, no rash or nodules. Diffuse erythema, likely related to IV Epo; + brusing.     LABS:  Lab Results   Component Value Date    NA 132 (L) 05/29/2017    K 3.1 (L) 05/29/2017    CL 96 (L) 05/29/2017    BICARB 20 (L) 05/29/2017    BUN 88 (H) 05/29/2017    CREAT 2.36 (H) 05/29/2017    GLU 84 05/29/2017    [place] 8.4 (L) 05/29/2017     Lab Results   Component Value Date    WBC 3.7 (L) 05/29/2017    HGB 9.4 (L) 05/29/2017    HCT 29.1 (L) 05/29/2017    PLT 62 (L) 05/29/2017  SEG 62 05/29/2017    LYMPHS 25 05/29/2017    MONOS 8 05/29/2017    EOS 1 05/29/2017     Chest Xray: 05/16/17  Enlargement of the cardiac silhouette from the prior chest radiograph which may related to cardiac chamber enlargement/increased intravascular volume but pericardial effusion cannot be excluded.  Sequela of portal hypertension. Query mild interstitial pulmonary edema and small right pleural effusion.    TTE 05/16/17  1. The right ventricular size is severely enlarged and systolic function is reduced.  2. Severe pulmonary hypertension with RVSP measuring 100 mmHg.  3. The left ventricular size is normal and the left ventricular systolic function is normal.  4. Mild left ventricular hypertrophy.  5. Severely dilated right atrium.  6. Moderate tricuspid regurgitation.  7. Mild aortic stenosis with mild regurgitation.  8. Moderate Pulmonic regurgitation.  9. Severely dilated Pulmonary artery.  10. Compared to prior study estimated RVSP is elevated, was 75 mmHg and now 100 mmHg.    ASSESSMENT AND PLAN  67 yo F with WHO I PAH associated with SLE and prior anorexogen use admitted with acute on chronic right heart failure and volume overload as well as AKI and  thrombocytopenia.Persistent edema.    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 Goal I/O negative  - Continue Spironolactone.   - Continue Dopamine 2 mcg/kg/min to help with diuresis   - Continue home sildenafil and Flolan at 35 ng/kg/min  - Continue home oxygen  - Continue holding digoxin for now since on dopamine. Levels came down.     [name] MD  Pager [phone]

## 2017-05-29 NOTE — Plan of Care (Signed)
Problem: Promotion of Health and Safety  Goal: Promotion of Health and Safety  The patient remains safe, receives appropriate treatment and achieves optimal outcomes (physically, psychosocially, and spiritually) within the limitations of the disease process by discharge.    Information below is the current care plan.  Outcome: Progressing   05/27/17 0519 05/29/17 0515 05/29/17 0800   Patient /Family stated Goal   Patient /Family stated Goal --  --  get this fluid off my legs   Adult/Peds Plan of Care   Guidelines --  --  --    Individualized Interventions/Recommendations #1 --  Continue flolan infusion, monitor for s/s of respiratory distress. --    Individualized Interventions/Recommendations #2 (if applicable) --  Monitor for strict I/O, daily wt. --    Individualized Interventions/Recommendations #3 (if applicable) encourage ambulating around unit --  --    Outcome Evaluation (rationale for progressing/not progressing) every shift --  --  --     05/29/17 1824   Patient /Family stated Goal   Patient /Family stated Goal --    Adult/Peds Plan of Care   Guidelines Inpatient Nursing Guidelines   Individualized Interventions/Recommendations #1 --    Individualized Interventions/Recommendations #2 (if applicable) --    Individualized Interventions/Recommendations #3 (if applicable) --    Outcome Evaluation (rationale for progressing/not progressing) every shift pt is still on continous drip and tolerating well. voiding in hat with documentatino. pt continous to use call light when attempting to get out of bed. legs still with edmea at 2+

## 2017-05-29 NOTE — Plan of Care (Signed)
Problem: Promotion of Health and Safety  Goal: Promotion of Health and Safety  The patient remains safe, receives appropriate treatment and achieves optimal outcomes (physically, psychosocially, and spiritually) within the limitations of the disease process by discharge.    Information below is the current care plan.  Outcome: Progressing   05/27/17 0519 05/28/17 0341 05/29/17 0515   Adult/Peds Plan of Care   Guidelines --  --  Inpatient Nursing Guidelines   Individualized Interventions/Recommendations #1 --  --  Continue flolan infusion, monitor for s/s of respiratory distress.   Individualized Interventions/Recommendations #2 (if applicable) --  --  Monitor for strict I/O, daily wt.   Individualized Interventions/Recommendations #3 (if applicable) encourage ambulating around unit --  --    Individualized Interventions/Recommendations #5 (if applicable) --  Continue Dopamine drip for renal dose. --    Outcome Evaluation (rationale for progressing/not progressing) every shift --  --  Pt alert/o x 4, tele SR with 1st degree AVB. Pt diuresing well with lasix. Continue with O2 at 5L/NC, no signs of respiratory distress noted. continue with flolan and dopamine drip.Marland Kitchen

## 2017-05-30 LAB — VBG+O2HBV+O2S+O2CNV
BE, Ven: -1.8 mmol/L (ref <=2.3)
FIO2, Ven: 100 %
HCO3, Ven: 23 mmol/L (ref 23–28)
Hct (Est), Ven: 31 % — ABNORMAL LOW (ref 36–46)
Hgb, Ven: 10.2 g/dL — ABNORMAL LOW (ref 12.0–16.0)
O2 Content, Ven: 8.7 vol %
O2 Hgb, Ven: 60.9 (ref 40.0–70.0)
O2 Sat, Ven: 61.5 %
Temp, Ven: 36.5 'C
pCO2, Ven (T): 45 mmHg (ref 40–52)
pCO2, Ven (Uncorr): 46 mmHg (ref 40–52)
pH, Ven (T): 7.34 (ref 7.33–7.40)
pH, Ven (Uncorr): 7.33 (ref 7.33–7.40)
pO2, Ven (T): 32 mmHg (ref 25–44)
pO2, Ven (Uncorr): 33 mmHg (ref 25–44)

## 2017-05-30 LAB — URINALYSIS, MICRO ONLY

## 2017-05-30 LAB — BASIC METABOLIC PANEL, BLOOD
Anion Gap: 18 mmol/L — ABNORMAL HIGH (ref 7–15)
BUN: 89 mg/dL — ABNORMAL HIGH (ref 8–23)
Bicarbonate: 19 mmol/L — ABNORMAL LOW (ref 22–29)
Calcium: 8.1 mg/dL — ABNORMAL LOW (ref 8.5–10.6)
Chloride: 96 mmol/L — ABNORMAL LOW (ref 98–107)
Creatinine: 2.56 mg/dL — ABNORMAL HIGH (ref 0.51–0.95)
GFR: 19 mL/min
Glucose: 85 mg/dL (ref 70–99)
Potassium: 3.2 mmol/L — ABNORMAL LOW (ref 3.5–5.1)
Sodium: 133 mmol/L — ABNORMAL LOW (ref 136–145)

## 2017-05-30 LAB — PHOSPHORUS, BLOOD: Phosphorous: 5.2 mg/dL — ABNORMAL HIGH (ref 2.7–4.5)

## 2017-05-30 LAB — MAGNESIUM, BLOOD: Magnesium: 2.3 mg/dL (ref 1.6–2.4)

## 2017-05-30 MED ORDER — POTASSIUM CHLORIDE CRYS CR 20 MEQ OR TBCR
40.00 meq | EXTENDED_RELEASE_TABLET | Freq: Once | ORAL | Status: AC
Start: 2017-05-30 — End: 2017-05-30
  Administered 2017-05-30: 40 meq via ORAL
  Filled 2017-05-30: qty 2

## 2017-05-30 NOTE — Progress Notes (Signed)
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 failure on 3Lpm now here with worsening LEx edema, AKI, now on dopamine    Review of Systems:   All other systems were reviewed and are negative except as noted above    Current Medications:   furosemide (LASIX) IVPB (doses > 80 mg)  100 mg 4x Daily    medroxyPROGESTERone  1.25 mg Q24H NR    nystatin  5 mL 4x Daily    potassium chloride  40 mEq Once    Prostacyclin Cassette Change   Daily    Prostacyclin Tubing   Once per day on Mon Wed Fri    Prostacylin Batteries   Once per day on Mon    sildenafil  80 mg TID    sodium chloride (PF)  3 mL Q8H    spironolactone  75 mg Daily      DOPamine 2 mcg/kg/min (05/30/17 0805)    epoprostenol (FLOLAN) infusion 35 ng/kg/min (05/30/17 0805)    sodium chloride        acetaminophen  650 mg Q6H PRN    loperamide  2 mg TID PRN    nalOXone  0.1 mg Q2 Min PRN    sodium chloride (PF)  3 mL PRN    sodium chloride   Continuous PRN       Vital Signs:  Temperature:  [97.9 F (36.6 C)-98.7 F (37.1 C)] 97.9 F (36.6 C) (05/18 0759)  Blood pressure (BP): (101-115)/(50-60) 111/60 (05/18 0759)  Heart Rate:  [85-91] 85 (05/18 0759)  Respirations:  [8-20] 20 (05/18 0759)  Pain Score: 0 (05/18 0759)  O2 Device: Nasal cannula (05/18 0836)  O2 Flow Rate (L/min):  [3 l/min] 3 l/min (05/18 0836)  SpO2:  [96 %-99 %] 97 % (05/18 0836)  Wt Readings from Last 1 Encounters:   05/30/17 66.2 kg (145 lb 14.4 oz)     Intake/Output (Current Shift):  05/17 0600 - 05/18 0559  In: 1517 [P.O.:1650; I.V.:3]  Out: 2400 [Urine:2400]  Stool x4 today    Physical Exam:  Gen: A&O, conversing comfortably  Eyes: EOMI, pupils equal, anicteric  HENT: Atraumatic, normocephalic  Neck: + JVD, trachea midline  CV: RRR, +murmur, accentuated S2, No R/G/S3. L tunneled IJ CVC site is CDI  Resp: stable bibasilar crackles,  no intercostal retractions/normal effort  Abdo: NT mild D, soft, no masses.  Ext: +2 BLEx edema  Neuro: No focal weakness or sensory deficit, CN grossly intact  Psych: Appropriate affect and mood, good insight. AnOx3  Skin: Normal temperature, no rash or nodules. Diffuse erythema, likely related to IV Epo; + brusing.     LABS:  Lab Results   Component Value Date    NA 133 (L) 05/30/2017    K 3.2 (L) 05/30/2017    CL 96 (L) 05/30/2017    BICARB 19 (L) 05/30/2017    BUN 89 (H) 05/30/2017    CREAT 2.56 (H) 05/30/2017    GLU 85 05/30/2017    [place] 8.1 (L) 05/30/2017     Lab Results   Component Value Date    WBC 3.7 (L) 05/29/2017    HGB 9.4 (L) 05/29/2017    HCT 29.1 (L) 05/29/2017    PLT 62 (L) 05/29/2017    SEG 62 05/29/2017  LYMPHS 25 05/29/2017    MONOS 8 05/29/2017    EOS 1 05/29/2017       ASSESSMENT AND PLAN  67 yo F with WHO I PAH associated with SLE and prior anorexogen use admitted with acute on chronic right heart failure and volume overload as well as AKI and thrombocytopenia.Persistent edema.    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  - Continue Spironolactone.   - Continue Dopamine 2 mcg/kg/min to help with diuresis   - Continue home sildenafil and Flolan at 35 ng/kg/min  - Continue oxygen suppl    [name] MD  Pager [phone]

## 2017-05-30 NOTE — Plan of Care (Signed)
Problem: Promotion of Health and Safety  Goal: Promotion of Health and Safety  The patient remains safe, receives appropriate treatment and achieves optimal outcomes (physically, psychosocially, and spiritually) within the limitations of the disease process by discharge.    Information below is the current care plan.  Outcome: Progressing   05/27/17 0519 05/28/17 0341 05/29/17 0515   Patient /Family stated Goal   Patient /Family stated Goal --  --  --    Adult/Peds Plan of Care   Guidelines --  --  --    Individualized Interventions/Recommendations #1 --  --  Continue flolan infusion, monitor for s/s of respiratory distress.   Individualized Interventions/Recommendations #2 (if applicable) --  --  Monitor for strict I/O, daily wt.   Individualized Interventions/Recommendations #3 (if applicable) encourage ambulating around unit --  --    Individualized Interventions/Recommendations #4 (if applicable) strict I&Os, qd weight --  --    Individualized Interventions/Recommendations #5 (if applicable) --  Continue Dopamine drip for renal dose. --    Outcome Evaluation (rationale for progressing/not progressing) every shift --  --  --     05/29/17 1824 05/29/17 2000   Patient /Family stated Goal   Patient /Family stated Goal --  sleep and rest tonight   Adult/Peds Plan of Care   Guidelines Inpatient Nursing Guidelines --    Individualized Interventions/Recommendations #1 --  --    Individualized Interventions/Recommendations #2 (if applicable) --  --    Individualized Interventions/Recommendations #3 (if applicable) --  --    Individualized Interventions/Recommendations #4 (if applicable) --  --    Individualized Interventions/Recommendations #5 (if applicable) --  --    Outcome Evaluation (rationale for progressing/not progressing) every shift pt is still on continous drip and tolerating well. voiding in hat with documentatino. pt continous to use call light when attempting to get out of bed. legs still with edmea at 2+  --

## 2017-05-30 NOTE — Plan of Care (Signed)
Problem: Promotion of Health and Safety  Goal: Promotion of Health and Safety  The patient remains safe, receives appropriate treatment and achieves optimal outcomes (physically, psychosocially, and spiritually) within the limitations of the disease process by discharge.    Information below is the current care plan.  Outcome: Progressing   05/30/17 0759 05/30/17 1728   Patient /Family stated Goal   Patient /Family stated Goal go for a walk --    Adult/Peds Plan of Care   Guidelines --  Inpatient Nursing Guidelines   Individualized Interventions/Recommendations #1 --  assist patient with ambulation safely, encourage to increase activity as tolerated   Individualized Interventions/Recommendations #2 (if applicable) --  continue Flolan, Dopamine gtt and Lasix as ordered, see MAR for details. Monitor I/Os. Continue oxygen therapy, monitor for distress.    Individualized Interventions/Recommendations #3 (if applicable) --  continue telemetry level of care, monitor VS q4 as ordered.    Outcome Evaluation (rationale for progressing/not progressing) every shift --  Patient states she's feeling better, concerned not putting out as much urine as yesterday. Patient ambulated independently, stand by assist with lines and tubes. Encourage patient to ambulate as tolerated, does call appropriately for assistance as needed. VSS. No acute respiratory distress noted at this time.

## 2017-05-31 LAB — BASIC METABOLIC PANEL, BLOOD
Anion Gap: 17 mmol/L — ABNORMAL HIGH (ref 7–15)
BUN: 94 mg/dL — ABNORMAL HIGH (ref 8–23)
Bicarbonate: 21 mmol/L — ABNORMAL LOW (ref 22–29)
Calcium: 8.3 mg/dL — ABNORMAL LOW (ref 8.5–10.6)
Chloride: 96 mmol/L — ABNORMAL LOW (ref 98–107)
Creatinine: 2.57 mg/dL — ABNORMAL HIGH (ref 0.51–0.95)
GFR: 19 mL/min
Glucose: 87 mg/dL (ref 70–99)
Potassium: 3.2 mmol/L — ABNORMAL LOW (ref 3.5–5.1)
Sodium: 134 mmol/L — ABNORMAL LOW (ref 136–145)

## 2017-05-31 LAB — MAGNESIUM, BLOOD: Magnesium: 2.4 mg/dL (ref 1.6–2.4)

## 2017-05-31 LAB — PHOSPHORUS, BLOOD: Phosphorous: 5.3 mg/dL — ABNORMAL HIGH (ref 2.7–4.5)

## 2017-05-31 MED ORDER — CHLOROTHIAZIDE SODIUM 500 MG IV SOLR
500.00 mg | Freq: Once | INTRAVENOUS | Status: AC
Start: 2017-05-31 — End: 2017-05-31
  Administered 2017-05-31: 500 mg via INTRAVENOUS
  Filled 2017-05-31: qty 18

## 2017-05-31 MED ORDER — POTASSIUM CHLORIDE CRYS CR 20 MEQ OR TBCR
20.00 meq | EXTENDED_RELEASE_TABLET | Freq: Once | ORAL | Status: AC
Start: 2017-05-31 — End: 2017-05-31
  Administered 2017-05-31: 20 meq via ORAL
  Filled 2017-05-31: qty 1

## 2017-05-31 NOTE — Plan of Care (Signed)
Problem: Promotion of Health and Safety  Goal: Promotion of Health and Safety  The patient remains safe, receives appropriate treatment and achieves optimal outcomes (physically, psychosocially, and spiritually) within the limitations of the disease process by discharge.    Information below is the current care plan.  Outcome: Progressing   05/27/17 0519 05/28/17 0341 05/30/17 1728   Patient /Family stated Goal   Patient /Family stated Goal --  --  --    Adult/Peds Plan of Care   Guidelines --  --  Inpatient Nursing Guidelines   Individualized Interventions/Recommendations #1 --  --  assist patient with ambulation safely, encocurage to increase activity as tolerated   Individualized Interventions/Recommendations #2 (if applicable) --  --  continue Flolan, Dopamine gtt and Lasix as ordered, see MAR for details. Monitor I/Os. Continue oxygen therapy, monitor for distress.    Individualized Interventions/Recommendations #3 (if applicable) --  --  continue telemetry level of care, monitor VS q4 as ordered.    Individualized Interventions/Recommendations #4 (if applicable) strict I&Os, qd weight --  --    Individualized Interventions/Recommendations #5 (if applicable) --  Continue Dopamine drip for renal dose. --    Outcome Evaluation (rationale for progressing/not progressing) every shift --  --  Patient states she's feeling better, concerned not putting out as much urine as yesterday. Patient ambulated independently, stand by assist with lines and tubes. Encourage patient to ambulate as tolerated, does call appropriately for assistance as needed. VSS.    05/31/17 0800   Patient /Family stated Goal   Patient /Family stated Goal to walk to laps   Adult/Peds Plan of Care   Guidelines --    Individualized Interventions/Recommendations #1 --    Individualized Interventions/Recommendations #2 (if applicable) --    Individualized Interventions/Recommendations #3 (if applicable) --    Individualized  Interventions/Recommendations #4 (if applicable) --    Individualized Interventions/Recommendations #5 (if applicable) --    Outcome Evaluation (rationale for progressing/not progressing) every shift --       05/27/17 0519 05/28/17 0341 05/30/17 1728   Patient /Family stated Goal   Patient /Family stated Goal --  --  --    Adult/Peds Plan of Care   Guidelines --  --  Inpatient Nursing Guidelines   Individualized Interventions/Recommendations #1 --  --  assist patient with ambulation safely, encocurage to increase activity as tolerated   Individualized Interventions/Recommendations #2 (if applicable) --  --  continue Flolan, Dopamine gtt and Lasix as ordered, see MAR for details. Monitor I/Os. Continue oxygen therapy, monitor for distress.    Individualized Interventions/Recommendations #3 (if applicable) --  --  continue telemetry level of care, monitor VS q4 as ordered.    Individualized Interventions/Recommendations #4 (if applicable) strict I&Os, qd weight --  --    Individualized Interventions/Recommendations #5 (if applicable) --  Continue Dopamine drip for renal dose. --    Outcome Evaluation (rationale for progressing/not progressing) every shift --  --  Patient states she's feeling better, concerned not putting out as much urine as yesterday. Patient ambulated independently, stand by assist with lines and tubes. Encourage patient to ambulate as tolerated, does call appropriately for assistance as needed. VSS.    05/31/17 0800   Patient /Family stated Goal   Patient /Family stated Goal to walk to laps   Adult/Peds Plan of Care   Guidelines --    Individualized Interventions/Recommendations #1 --    Individualized Interventions/Recommendations #2 (if applicable) --    Individualized Interventions/Recommendations #3 (if applicable) --  Individualized Interventions/Recommendations #4 (if applicable) --    Individualized Interventions/Recommendations #5 (if applicable) --    Outcome Evaluation (rationale for  progressing/not progressing) every shift --

## 2017-05-31 NOTE — Progress Notes (Signed)
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 failure on 3Lpm now here with worsening LEx edema, AKI, now on dopamine    Review of Systems:   All other systems were reviewed and are negative except as noted above    Current Medications:   chlorothiazide  500 mg Once    furosemide (LASIX) IVPB (doses > 80 mg)  100 mg 4x Daily    medroxyPROGESTERone  1.25 mg Q24H NR    nystatin  5 mL 4x Daily    Prostacyclin Cassette Change   Daily    Prostacyclin Tubing   Once per day on Mon Wed Fri    Prostacylin Batteries   Once per day on Mon    sildenafil  80 mg TID    sodium chloride (PF)  3 mL Q8H    spironolactone  75 mg Daily      DOPamine 2 mcg/kg/min (05/30/17 0805)    epoprostenol (FLOLAN) infusion 35 ng/kg/min (05/30/17 1425)    sodium chloride        acetaminophen  650 mg Q6H PRN    loperamide  2 mg TID PRN    nalOXone  0.1 mg Q2 Min PRN    sodium chloride (PF)  3 mL PRN    sodium chloride   Continuous PRN       Vital Signs:  Temperature:  [97.9 F (36.6 C)-98.1 F (36.7 C)] 98.1 F (36.7 C) (05/19 0805)  Blood pressure (BP): (105-132)/(53-67) 132/66 (05/19 0805)  Heart Rate:  [87-91] 87 (05/19 0805)  Respirations:  [18-20] 20 (05/19 0805)  Pain Score: 0 (05/19 0805)  O2 Device: Nasal cannula (05/19 0805)  O2 Flow Rate (L/min):  [3 l/min] 3 l/min (05/19 0805)  SpO2:  [95 %-99 %] 96 % (05/19 0805)  Wt Readings from Last 1 Encounters:   05/31/17 66.3 kg (146 lb 3.2 oz)     Intake/Output (Current Shift):  05/18 0600 - 05/19 0559  In: 857.8 [P.O.:740; I.V.:117.8]  Out: 2050 [Urine:2050]  Stool x4 today    Physical Exam:  Gen: A&O, conversing comfortably  Eyes: EOMI, pupils equal, anicteric  HENT: Atraumatic, normocephalic  Neck: + JVD, trachea midline  CV: RRR, +murmur, accentuated S2, No R/G/S3. L tunneled IJ CVC site is CDI  Resp: stable bibasilar crackles,  no intercostal retractions/normal effort  Abdo: NT mild D, soft, no masses.  Ext: +2 BLEx edema  Neuro: No focal weakness or sensory deficit, CN grossly intact  Psych: Appropriate affect and mood, good insight. AnOx3  Skin: Normal temperature, no rash or nodules. Diffuse erythema, likely related to IV Epo; + brusing.     LABS:  Lab Results   Component Value Date    NA 134 (L) 05/31/2017    K 3.2 (L) 05/31/2017    CL 96 (L) 05/31/2017    BICARB 21 (L) 05/31/2017    BUN 94 (H) 05/31/2017    CREAT 2.57 (H) 05/31/2017    GLU 87 05/31/2017    [place] 8.3 (L) 05/31/2017     ASSESSMENT AND PLAN  67 yo F with WHO I PAH associated with SLE and prior anorexogen use admitted with acute on chronic right heart failure and volume overload as well as AKI and thrombocytopenia.Persistent edema.    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  - Diuril 500 mg IV x1  - Continue Spironolactone  - Continue Dopamine 2 mcg/kg/min to help with diuresis  - Continue home sildenafil and flolan at 35 ng/kg/min  - Continue oxygen suppl    [name] MD  Pager [phone]

## 2017-05-31 NOTE — Plan of Care (Signed)
Problem: Promotion of Health and Safety  Goal: Promotion of Health and Safety  The patient remains safe, receives appropriate treatment and achieves optimal outcomes (physically, psychosocially, and spiritually) within the limitations of the disease process by discharge.    Information below is the current care plan.  Outcome: Progressing   05/27/17 0519 05/28/17 0341 05/30/17 1728   Patient /Family stated Goal   Patient /Family stated Goal --  --  --    Adult/Peds Plan of Care   Guidelines --  --  Inpatient Nursing Guidelines   Individualized Interventions/Recommendations #1 --  --  assist patient with ambulation safely, encocurage to increase activity as tolerated   Individualized Interventions/Recommendations #2 (if applicable) --  --  continue Flolan, Dopamine gtt and Lasix as ordered, see MAR for details. Monitor I/Os. Continue oxygen therapy, monitor for distress.    Individualized Interventions/Recommendations #3 (if applicable) --  --  continue telemetry level of care, monitor VS q4 as ordered.    Individualized Interventions/Recommendations #4 (if applicable) strict I&Os, qd weight --  --    Individualized Interventions/Recommendations #5 (if applicable) --  Continue Dopamine drip for renal dose. --    Outcome Evaluation (rationale for progressing/not progressing) every shift --  --  Patient states she's feeling better, concerned not putting out as much urine as yesterday. Patient ambulated independently, stand by assist with lines and tubes. Encourage patient to ambulate as tolerated, does call appropriately for assistance as needed. VSS.    05/31/17 0310   Patient /Family stated Goal   Patient /Family stated Goal sleep and rest tonight   Adult/Peds Plan of Care   Guidelines --    Individualized Interventions/Recommendations #1 --    Individualized Interventions/Recommendations #2 (if applicable) --    Individualized Interventions/Recommendations #3 (if applicable) --    Individualized  Interventions/Recommendations #4 (if applicable) --    Individualized Interventions/Recommendations #5 (if applicable) --    Outcome Evaluation (rationale for progressing/not progressing) every shift --

## 2017-06-01 LAB — BASIC METABOLIC PANEL, BLOOD
Anion Gap: 17 mmol/L — ABNORMAL HIGH (ref 7–15)
Anion Gap: 16 mmol/L — ABNORMAL HIGH (ref 7–15)
BUN: 97 mg/dL — ABNORMAL HIGH (ref 8–23)
BUN: 98 mg/dL — ABNORMAL HIGH (ref 8–23)
Bicarbonate: 20 mmol/L — ABNORMAL LOW (ref 22–29)
Bicarbonate: 23 mmol/L (ref 22–29)
Calcium: 8.2 mg/dL — ABNORMAL LOW (ref 8.5–10.6)
Calcium: 8.4 mg/dL — ABNORMAL LOW (ref 8.5–10.6)
Chloride: 95 mmol/L — ABNORMAL LOW (ref 98–107)
Chloride: 94 mmol/L — ABNORMAL LOW (ref 98–107)
Creatinine: 2.42 mg/dL — ABNORMAL HIGH (ref 0.51–0.95)
Creatinine: 2.49 mg/dL — ABNORMAL HIGH (ref 0.51–0.95)
GFR: 20 mL/min
GFR: 19 mL/min
Glucose: 89 mg/dL (ref 70–99)
Glucose: 119 mg/dL — ABNORMAL HIGH (ref 70–99)
Potassium: 3 mmol/L — ABNORMAL LOW (ref 3.5–5.1)
Potassium: 3.5 mmol/L (ref 3.5–5.1)
Sodium: 132 mmol/L — ABNORMAL LOW (ref 136–145)
Sodium: 133 mmol/L — ABNORMAL LOW (ref 136–145)

## 2017-06-01 LAB — MAGNESIUM, BLOOD
Magnesium: 2.3 mg/dL (ref 1.6–2.4)
Magnesium: 2.4 mg/dL (ref 1.6–2.4)

## 2017-06-01 LAB — PHOSPHORUS, BLOOD
Phosphorous: 5.7 mg/dL — ABNORMAL HIGH (ref 2.7–4.5)
Phosphorous: 5.8 mg/dL — ABNORMAL HIGH (ref 2.7–4.5)

## 2017-06-01 MED ORDER — POTASSIUM CHLORIDE CRYS CR 20 MEQ OR TBCR
40.00 meq | EXTENDED_RELEASE_TABLET | Freq: Two times a day (BID) | ORAL | Status: DC
Start: 2017-06-01 — End: 2017-06-17
  Administered 2017-06-01 – 2017-06-17 (×33): 40 meq via ORAL
  Filled 2017-06-01 (×33): qty 2

## 2017-06-01 MED ORDER — CHLOROTHIAZIDE SODIUM 500 MG IV SOLR
500.00 mg | Freq: Two times a day (BID) | INTRAVENOUS | Status: DC
Start: 2017-06-01 — End: 2017-06-08
  Administered 2017-06-01 – 2017-06-08 (×15): 500 mg via INTRAVENOUS
  Filled 2017-06-01 (×16): qty 18

## 2017-06-01 NOTE — Consults (Signed)
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     Chief Complaint:  Leg edema.     History of Present Illness:     Diana Chambers is a 67 year old female with hx of Group 1 PAH associated with SLE and /Sjogren's disease, presented to the hospital with worsening legs swelling. Nephrology consulted for Sjogrens related nephritis given response to steroid therapy in the past and improvement in volume status.         Of note pt has been making good UOP every day with net negative about -1.2 on average every day but surprisingly no changes in weight, also denies drinking any other fluids than what is provided by the hospital.     Pt reports a significant  swelling of LE, no SOB, chest pain, cough or other symptoms.          Past Medical Surgical History:  Past Medical History:   Diagnosis Date    Pulmonary HTN (CMS-HCC)     Sjoegren syndrome (CMS-HCC) 07/12/2009     No past surgical history on file.    Allergies:  Allergies   Allergen Reactions    Allopurinol Hives    Levaquin Swelling and Other     Severe joint pain      Ofloxacin Nausea and Vomiting    Chlorhexidine Itching    Sulfa Drugs Unspecified       Medications:  Medications Prior to Admission   Medication Sig Dispense Refill Last Dose    azelastine (ASTELIN) 0.1 % nasal spray Spray 1 spray into each nostril 2 times daily. Use in each nostril as directed 1 bottle 3 Not Taking    Cholecalciferol 2000 UNITS CAPS Take 1 capsule by mouth daily.   Taking    digoxin (LANOXIN) 0.125 MG tablet Take 1 tablet (125 mcg) by mouth every evening. 30 tablet 3 Taking    Estradiol (DIVIGEL) 0.25 MG/0.25GM GEL Apply 0.25 mg topically nightly.   Taking    estradiol (ESTRING) 2 MG vaginal ring Insert 1 Ring vaginally Every 3 months. Follow package directions   Taking    ferrous sulfate 324 (65 FE) MG TBEC Take 1 tablet (324 mg) by mouth daily. 30  tablet 0 Taking    FLOLAN 1.5 MG IV SOLR [id number]   Taking    FOLIC ACID 638 MCG OR TABS 1 TABLET DAILY   Taking    furosemide (LASIX) 40 MG tablet Take 2.5 tablets (100 mg) by mouth 3 times daily. 200 tablet 3 Taking    medroxyPROGESTERone (PROVERA) 2.5 MG tablet Take 1.25 mg by mouth daily.   Taking    potassium chloride (KLOR-CON) 10 MEQ tablet TAKE 4 TABLETS BY MOUTH DAILY. 120 each 0 Taking    [EXPIRED] sildenafil (REVATIO, VIAGRA) 20 MG tablet TAKE FOUR TABLETS BY MOUTH THREE TIMES DAILY 360 each 0 Taking    spironolactone (ALDACTONE) 50 MG tablet TAKE 1 TABLET BY MOUTH DAILY. (Patient taking differently: Take 75 mg by mouth.  ) 30 each 0 Taking    vitamin b-12 (CYANOCOBALAMIN) 500 MCG tablet TAKE 1 TABLET BY MOUTH DAILY. 30 each 3 Taking       Social History:  Social History     Socioeconomic History    Marital status: Married     Spouse name: Not on file    Number of children: Not on file  Years of education: Not on file    Highest education level: Not on file   Occupational History    Not on file   Tobacco Use    Smoking status: Former Smoker     Packs/day: 0.50     Years: 5.00     Pack years: 2.50     Last attempt to quit: 01/13/1978     Years since quitting: 39.4    Smokeless tobacco: Never Used   Substance and Sexual Activity    Alcohol use: No    Drug use: Not on file    Sexual activity: Not on file   Social Activities of Daily Living Present    Not on file   Social History Narrative    Not on file       Family History:  Family History   Problem Relation Name Age of Onset    Heart Disease Mother      Hypertension Mother      Psychiatry Mother      Heart Disease Father         Review of Systems:  Review of systems is negative except as noted in the HPI.    Physical Exam:  BP 118/59 (BP Location: Left arm, BP Patient Position: Sitting)    Pulse 92    Temp 98 F (36.7 C)    Resp 20    Ht 5\' 2"  (1.575 m)    Wt 66.7 kg (147 lb 1.6 oz)    SpO2 99%    BMI 26.90 kg/m   HEENT:   NC/AT  General:NAD, stting in chair,  Eyes: Anicetric foster  Mouth: MMM  CV: RRR  Pulm: Decrease breathing sounds, no grill.   Abd:  Soft, non tender,  distended,   GU: Foley  Ext  4 mm edema on both LE.       Labs:  Lab Results   Component Value Date    NA 133 (L) 06/01/2017    K 3.5 06/01/2017    CL 94 (L) 06/01/2017    BICARB 23 06/01/2017    BUN 98 (H) 06/01/2017    CREAT 2.49 (H) 06/01/2017    GLU 119 (H) 06/01/2017     8.4 (L) 06/01/2017     No results found for: WBC, HGB, HCT, PLT, SEG, BAND, LYMPHS, MONOS, EOS, NRBC    Assessment: Recommendations:    # AKI on CKD in the setting of PAH/rt sided HF, responding well to diuretics with Net negative almost every day since admission but weight is unchanged despite having unmeasured stools on top of the UOP which would make surreptitious fluid intake possible though pt denies that  -UA is bland, no evidence of any active inflammatory process in kidneys and UA has been same for years despite using steroids intermittently with improvement in kidney function and UOP.   -Please check spot urine protein to Cr ratio      #Lytes: acceptable.   # Volume: Hypervolemic, diuresis per primary team to keep net negative - 1-2 lit. Continue with Lasix IV and diuril.   Currently on lasix IV 100 TID   #Acid-Base: acceptable.   # MBD: phos slightly elevated, continue renal diet and monitor for now.     Thank you for this consult. We will follow with you. The above plan was discussed with the attending, Dr. Filomena Jungling    Florene Glen, MD  Nephrology Fellow, PGY 4  Division of Nephrology-Hypertension   Pager: [phone]

## 2017-06-01 NOTE — Plan of Care (Signed)
Problem: Promotion of Health and Safety  Goal: Promotion of Health and Safety  The patient remains safe, receives appropriate treatment and achieves optimal outcomes (physically, psychosocially, and spiritually) within the limitations of the disease process by discharge.    Information below is the current care plan.  Outcome: Progressing   06/01/17 1056   Patient /Family stated Goal   Patient /Family stated Goal i want to walk more today    Adult/Peds Plan of Care   Individualized Interventions/Recommendations #1 pt is staedy on her gait , monitor vitals signs , pain mgt .    Individualized Interventions/Recommendations #2 (if applicable) monitor input and output , check for labs and generalized edema    Outcome Evaluation (rationale for progressing/not progressing) every shift pt is stable at this time , pt is voiding well . no s/s of distress noted , flolan drip is stable w/o any issue at this time

## 2017-06-01 NOTE — Plan of Care (Signed)
Problem: Promotion of Health and Safety  Goal: Promotion of Health and Safety  The patient remains safe, receives appropriate treatment and achieves optimal outcomes (physically, psychosocially, and spiritually) within the limitations of the disease process by discharge.    Information below is the current care plan.  Outcome: Progressing   05/31/17 1914 06/01/17 0536   Patient /Family stated Goal   Patient /Family stated Goal lose more water weight --    Adult/Peds Plan of Care   Guidelines --  Inpatient Nursing Guidelines   Individualized Interventions/Recommendations #1 --  encourage independence, pt steady on feet. able to ambulate around room ad lib   Individualized Interventions/Recommendations #2 (if applicable) --  pain management   Individualized Interventions/Recommendations #3 (if applicable) --  Strict I&Os, monitoring for s/s fluid retention   Individualized Interventions/Recommendations #4 (if applicable) --  dopamine/IV lasix/flolan    Individualized Interventions/Recommendations #5 (if applicable) --  monitor renal function/electrolyte replacement   Outcome Evaluation (rationale for progressing/not progressing) every shift --  pt AOx4, VSS, SR 1HB on monitor. Tylenol for left arm pain from IV infiltration. diuresing well tonight. QD weight. Skin red/flushed, no changes. encourage Positive reinforcement as patient is hard on herself emotionally.

## 2017-06-01 NOTE — Interdisciplinary (Addendum)
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 failure on 3Lpm now here with worsening LEx edema, AKI, now on dopamine        Anthropometrics   Height: 5\' 2"  (157.5 cm)   Weight: 66.7 kg (147 lb 1.6 oz)      IBW= 110 lb  %IBW= 134%  Body mass index is 26.9 kg/m.   UBW: 135-139lb per DTR 05/18/17  WT Change: pt weight has been fluctrating between 144-147 lbs   Weights (last 14 days)     Date/Time Weight Weight Source Percentage Weight Change (%) Who    06/01/17 0600  66.7 kg (147 lb 1.6 oz)  Standing scale  0.62 % RG    05/31/17 0500  66.3 kg (146 lb 3.2 oz)  Standing scale  0.21 % RV    05/30/17 0322  66.2 kg (145 lb 14.4 oz)  Standing scale  -0.33 % RV    05/29/17 0400  66.4 kg (146 lb 6.2 oz)  Standing scale  -0.6 % [facility]    05/28/17 0311  66.8 kg (147 lb 4.3 oz)  Standing scale  -0.23 % [facility]    05/27/17 0446  67 kg (147 lb 9.6 oz)  Standing scale  0.34 % RG    05/26/17 0400  66.7 kg (147 lb 1.6 oz)  Standing scale  0.64 % RG    05/25/17 0400  66.3 kg (146 lb 2.6 oz)  Standing scale  1.22 % RG    05/24/17 0400  65.5 kg (144 lb 6.4 oz)  Standing scale  -0.41 % JS    05/23/17 0412  65.8 kg (145 lb)  Standing scale  -0.19 % AE    05/22/17 0519  65.9 kg (145 lb 4.5 oz)  Standing scale  -0.6 % SB    05/21/17 0400  66.3 kg (146 lb 2.6 oz)  Standing scale  0.25 % SB    05/20/17 0437  66.1 kg (145 lb 12.8 oz)  Standing scale  1.12 % KW    05/19/17 0158  65.4 kg (144 lb 2.9 oz)  Standing scale  -0.31 % LC    05/18/17 0453  65.6 kg (144 lb 10 oz)  Standing scale  1.2 % MA               Diet:Diet Therapeutic; Renal Restricted; Renal 60g Protein/ 2g Na/ 2g K+/ 1g Phos  PO Intake: pt is consuming an average of 95% x 15 meals, pt said " my appetite is good"  Tray Items Taken for the past 168 hrs:   Number of Items Taken Number of Items on Tray Diet Tolerance   05/26/17 1000 3 3 Tolerates   05/26/17 1259 2 3 Tolerates   05/26/17  1854 3 3 Tolerates   05/27/17 1000 4 4 --   05/27/17 1300 4 4 Tolerates   05/27/17 1739 5 5 Tolerates   05/28/17 0800 4 4 Tolerates   05/28/17 1200 5 5 Tolerates   05/29/17 0958 5 5 Tolerates   05/29/17 1200 5 5 Tolerates   05/29/17 1620 4 4 Tolerates   05/30/17 1200 4 4 Tolerates   05/31/17 0800 3 4 Tolerates   05/31/17 1327 3 3 Tolerates   05/31/17 1800 3 4 Tolerates     No data found.    Allergies/Intolerances:   Allergies   Allergen Reactions  Allopurinol Hives    Levaquin Swelling and Other     Severe joint pain      Ofloxacin Nausea and Vomiting    Chlorhexidine Itching    Sulfa Drugs Unspecified     Chewing/Swallowing difficulty: no chewing and swallowing difficulty   GI: pt reports having diarrhea due to taking imodium, no nausea and vomiting  Stool Assessment for the past 168 hrs:   Stool Appearance Stool Color Stool Amount Stool Source   05/26/17 1003 Partially liquid Brown Medium Rectum   05/27/17 1237 Soft formed Brown Small Rectum   05/28/17 0800 Soft formed Brown Medium Rectum   05/28/17 1816 Partially liquid Brown Small Rectum   05/29/17 0325 Soft formed Brown Small Rectum   05/29/17 1100 Partially liquid -- -- Rectum   05/30/17 1052 Entirely liquid Brown Medium Rectum   05/31/17 1327 Partially liquid Brown;Yellow Medium --   06/01/17 1045 Soft formed Tan (Comment) Medium Rectum   06/01/17 1300 Entirely liquid Yellow -- Rectum       Skin: ecchymosis;redness from head to toe assessment   Patient Lines/Drains/Airways Status    Active PICC Line / CVC Line / PIV Line / Drain / Airway / Intraosseous Line / Epidural Line / ART Line / Line Type / Wound     Name: Placement date: Placement time: Site: Days:    CVC Single Lumen - Present on admission Left Chest  --   --   Chest      Peripheral IV - 20 G Right Antecubital  05/21/17   2100   Antecubital  10    Peripheral IV - 22 G Right Wrist  05/31/17   1226   Wrist  1                Labs:   Lab Results   Component Value Date    A1C 5.5 07/20/2016       Recent Labs     05/30/17  0605 05/31/17  0655 06/01/17  0556   NA 133* 134* 132*   K 3.2* 3.2* 3.0*   CL 96* 96* 95*   BICARB 19* 21* 20*   BUN 89* 94* 97*   CREAT 2.56* 2.57* 2.42*   GLU 85 87 89   [place] 8.1* 8.3* 8.2*   MG 2.3 2.4 2.3   PHOS 5.2* 5.3* 5.7*     BS POCT: No results for input(s): GLUCPOCT in the last 72 hours.    Medications:   Current Facility-Administered Medications   Medication    acetaminophen (TYLENOL) tablet 650 mg    chlorothiazide (DIURIL) injection 500 mg    DOPamine (INTROPIN) 400 mg/250 mL infusion (1600 mcg/mL)    epoprostenol (FLOLAN) 60,000 ng/mL in glycine diluent 100 mL infusion    furosemide (LASIX) 100 mg in sodium chloride 0.9 % 50 mL IVPB    loperamide (IMODIUM) capsule 2 mg    medroxyPROGESTERone (PROVERA) tablet 1.25 mg    nalOXone (NARCAN) injection 0.1 mg    nystatin (MYCOSTATIN) suspension 500,000 Units    potassium chloride (KLOR-CON M20) ER tablet 40 mEq    Prostacyclin Cassette Change    Prostacyclin Tubing    Prostacylin Batteries    sildenafil (REVATIO, VIAGRA) tablet 80 mg    sodium chloride (PF) 0.9 % flush 3 mL    sodium chloride (PF) 0.9 % flush 3 mL    sodium chloride 0.9 % TKO infusion    spironolactone (ALDACTONE) tablet 75 mg  Education: Renal handout was provided to patient per request.  PLAN: Will continue to monitor PO intake and diet tolerance. Will relay relevant, significant pt info and status change to Registered Dietitian.    Alice Rieger, MS,DTR

## 2017-06-01 NOTE — Progress Notes (Signed)
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 failure on 3Lpm now here with worsening LEx edema, AKI, now on dopamine    Review of Systems:   All other systems were reviewed and are negative except as noted above    Current Medications:   chlorothiazide  500 mg Q12H    furosemide (LASIX) IVPB (doses > 80 mg)  100 mg 4x Daily    medroxyPROGESTERone  1.25 mg Q24H NR    nystatin  5 mL 4x Daily    potassium chloride  40 mEq BID    Prostacyclin Cassette Change   Daily    Prostacyclin Tubing   Once per day on Mon Wed Fri    Prostacylin Batteries   Once per day on Mon    sildenafil  80 mg TID    sodium chloride (PF)  3 mL Q8H    spironolactone  75 mg Daily      DOPamine 2 mcg/kg/min (05/31/17 1934)    epoprostenol (FLOLAN) infusion 35 ng/kg/min (05/31/17 1327)    sodium chloride        acetaminophen  650 mg Q6H PRN    loperamide  2 mg TID PRN    nalOXone  0.1 mg Q2 Min PRN    sodium chloride (PF)  3 mL PRN    sodium chloride   Continuous PRN       Vital Signs:  Temperature:  [98 F (36.7 C)-98.5 F (36.9 C)] 98.1 F (36.7 C) (05/20 1125)  Blood pressure (BP): (106-127)/(50-70) 117/66 (05/20 1125)  Heart Rate:  [85-92] 88 (05/20 1125)  Respirations:  [16-21] 20 (05/20 1125)  Pain Score: 0 (05/20 1125)  O2 Device: Nasal cannula (05/20 0754)  O2 Flow Rate (L/min):  [3 l/min] 3 l/min (05/20 0754)  SpO2:  [96 %-99 %] 98 % (05/20 1125)  Wt Readings from Last 1 Encounters:   06/01/17 66.7 kg (147 lb 1.6 oz)     Intake/Output (Current Shift):  05/19 0600 - 05/20 0559  In: 109 [P.O.:975]  Out: 1950 [Urine:1950]  Stool x4 today    Physical Exam:  Gen: A&O, conversing comfortably  Eyes: EOMI, pupils equal, anicteric  HENT: Atraumatic, normocephalic  Neck: + JVD, trachea midline  CV: RRR, +murmur, accentuated S2, No R/G/S3. L tunneled IJ CVC site is CDI  Resp: stable  bibasilar crackles, no intercostal retractions/normal effort  Abdo: NT mild D, soft, no masses.  Ext: +2 BLEx edema  Neuro: No focal weakness or sensory deficit, CN grossly intact  Psych: Appropriate affect and mood, good insight. AnOx3  Skin: Normal temperature, no rash or nodules. Diffuse erythema, likely related to IV Epo; + brusing.     LABS:  Lab Results   Component Value Date    NA 132 (L) 06/01/2017    K 3.0 (L) 06/01/2017    CL 95 (L) 06/01/2017    BICARB 20 (L) 06/01/2017    BUN 97 (H) 06/01/2017    CREAT 2.42 (H) 06/01/2017    GLU 89 06/01/2017    [place] 8.2 (L) 06/01/2017     ASSESSMENT AND PLAN  67 yo F with WHO I PAH associated with SLE and prior anorexogen use admitted with acute on chronic right heart failure and volume overload as well as AKI and thrombocytopenia.Persistent edema.  No progress on exam or weight despite negative 14 L by I/O.    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  - Diuril 500 mg IV BID  - Continue Spironolactone  - Continue Dopamine 2 mcg/kg/min to help with diuresis  - Continue home sildenafil and flolan at 35 ng/kg/min  - Continue oxygen suppl  - Patient previosly had a profound improvement in edema and efficacy of diuretics with initiation of steroids (06/13/2016).  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  Query if she has renal involvement of her sjogren's that is driving some of her refractory edema and might benefit from steroids.  Will start with renal consult.  Patient hesitant to try steroids again        [name] MD  Pager [phone]

## 2017-06-02 LAB — BASIC METABOLIC PANEL, BLOOD
Anion Gap: 17 mmol/L — ABNORMAL HIGH (ref 7–15)
Anion Gap: 16 mmol/L — ABNORMAL HIGH (ref 7–15)
BUN: 92 mg/dL — ABNORMAL HIGH (ref 8–23)
BUN: 94 mg/dL — ABNORMAL HIGH (ref 8–23)
Bicarbonate: 22 mmol/L (ref 22–29)
Bicarbonate: 19 mmol/L — ABNORMAL LOW (ref 22–29)
Calcium: 8.4 mg/dL — ABNORMAL LOW (ref 8.5–10.6)
Calcium: 7.7 mg/dL — ABNORMAL LOW (ref 8.5–10.6)
Chloride: 96 mmol/L — ABNORMAL LOW (ref 98–107)
Chloride: 99 mmol/L (ref 98–107)
Creatinine: 2.51 mg/dL — ABNORMAL HIGH (ref 0.51–0.95)
Creatinine: 2.22 mg/dL — ABNORMAL HIGH (ref 0.51–0.95)
GFR: 19 mL/min
GFR: 22 mL/min
Glucose: 84 mg/dL (ref 70–99)
Glucose: 143 mg/dL — ABNORMAL HIGH (ref 70–99)
Potassium: 3.4 mmol/L — ABNORMAL LOW (ref 3.5–5.1)
Potassium: 3.1 mmol/L — ABNORMAL LOW (ref 3.5–5.1)
Sodium: 135 mmol/L — ABNORMAL LOW (ref 136–145)
Sodium: 134 mmol/L — ABNORMAL LOW (ref 136–145)

## 2017-06-02 LAB — RANDOM URINE TOTAL PROTEIN: Total Protein, Urine: 9 mg/dL

## 2017-06-02 LAB — PHOSPHORUS, BLOOD: Phosphorous: 5.2 mg/dL — ABNORMAL HIGH (ref 2.7–4.5)

## 2017-06-02 LAB — MAGNESIUM, BLOOD
Magnesium: 2.3 mg/dL (ref 1.6–2.4)
Magnesium: 2.1 mg/dL (ref 1.6–2.4)

## 2017-06-02 LAB — RANDOM URINE CREATININE: Creatinine, Urine: 29 mg/dL (ref 29–226)

## 2017-06-02 MED ORDER — POTASSIUM CHLORIDE CRYS CR 20 MEQ OR TBCR
40.00 meq | EXTENDED_RELEASE_TABLET | Freq: Once | ORAL | Status: AC
Start: 2017-06-02 — End: 2017-06-02
  Administered 2017-06-02: 40 meq via ORAL
  Filled 2017-06-02: qty 2

## 2017-06-02 NOTE — Plan of Care (Signed)
Problem: Promotion of Health and Safety  Goal: Promotion of Health and Safety  The patient remains safe, receives appropriate treatment and achieves optimal outcomes (physically, psychosocially, and spiritually) within the limitations of the disease process by discharge.    Information below is the current care plan.  Outcome: Progressing   06/01/17 1056 06/01/17 2000 06/02/17 0529   Patient /Family stated Goal   Patient /Family stated Goal --  lose more water weight --    Adult/Peds Plan of Care   Guidelines --  --  Inpatient Nursing Guidelines   Individualized Interventions/Recommendations #1 pt is staedy on her gait , monitor vitals signs , pain mgt .  --  --    Individualized Interventions/Recommendations #2 (if applicable) monitor input and output , check for labs and generalized edema  --  --    Individualized Interventions/Recommendations #3 (if applicable) --  --  strict i&Os, electrolyte replacement, BID BMPs   Individualized Interventions/Recommendations #4 (if applicable) --  --  dopamine/IV lasix/IV diurel/flolan   Individualized Interventions/Recommendations #5 (if applicable) --  --  monitor renal function   Outcome Evaluation (rationale for progressing/not progressing) every shift --  --  Pt Aox4, no pain. dopamine and flolan infusing WDL. VSS. SR 1HB. diuresing well, qd weight. Maintain skin integrity, redness/echymosis throughout. Encourage ambulation today

## 2017-06-02 NOTE — Plan of Care (Signed)
Problem: Promotion of Health and Safety  Goal: Promotion of Health and Safety  The patient remains safe, receives appropriate treatment and achieves optimal outcomes (physically, psychosocially, and spiritually) within the limitations of the disease process by discharge.    Information below is the current care plan.  Outcome: Progressing   06/02/17 1140   Patient /Family stated Goal   Patient /Family stated Goal i loose more watery today    Adult/Peds Plan of Care   Individualized Interventions/Recommendations #1 monitor vitals signs , monitor input and output    Individualized Interventions/Recommendations #2 (if applicable) pt is on dopamine iv and lasix iv , flolan iv drip ongoing .   Outcome Evaluation (rationale for progressing/not progressing) every shift pt is hemodynamically stable , no acute distress noted .

## 2017-06-02 NOTE — Plan of Care (Signed)
Problem: Promotion of Health and Safety  Goal: Promotion of Health and Safety  The patient remains safe, receives appropriate treatment and achieves optimal outcomes (physically, psychosocially, and spiritually) within the limitations of the disease process by discharge.    Information below is the current care plan.  Outcome: Progressing   06/02/17 0529 06/02/17 1140 06/02/17 2159   Patient /Family stated Goal   Patient /Family stated Goal --  --  i need to go and void frequently   Adult/Peds Plan of Care   Individualized Interventions/Recommendations #1 --  monitor vitals signs , monitor input and output  --    Individualized Interventions/Recommendations #2 (if applicable) --  pt is on dopamine iv and lasix iv , flolan iv drip ongoing . --    Individualized Interventions/Recommendations #3 (if applicable) strict i&Os, electrolyte replacement, BID BMPs --  --    Individualized Interventions/Recommendations #4 (if applicable) --  --  monitor dopamine/ flolan gtt   Individualized Interventions/Recommendations #5 (if applicable) monitor renal function --  --    Outcome Evaluation (rationale for progressing/not progressing) every shift --  pt is hemodynamically stable , no acute distress noted . --        Comments: Pt with ongoing flolan gtt, extra CADD pump at bedside, tele Sr 1 avb, pt noted having frequency to void BRP's, assist adl's, declines use of BSC, K 3.1 this PM, MD paged/ aware, extra KCL PO given, to continue to monitor

## 2017-06-02 NOTE — Interdisciplinary (Signed)
K 3.1 this pm draw, 40 kcl pm dose given, MD Nokes paged for extra replacement, awaiting orders

## 2017-06-02 NOTE — Progress Notes (Signed)
NEPHROLOGY INPATIENT PROGRESS NOTE    Diana Chambers is a 67 year old female with hx of Group 1 PAH associated with SLE and /Sjogren's disease, presented to the hospital with worsening legs swelling. 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        Events:   -Continued to diurese with 2.4 lit UOP --> Net neg 1.5 lit but weight dropped by 0.8 kg only      Subjective/ROS  Pt feels better, urinating a lot, no SOB or chest pain.      Allergies:  Allergies   Allergen Reactions    Allopurinol Hives    Levaquin Swelling and Other     Severe joint pain      Ofloxacin Nausea and Vomiting    Chlorhexidine Itching    Sulfa Drugs Unspecified       Medications:  Medications Prior to Admission   Medication Sig Dispense Refill Last Dose    azelastine (ASTELIN) 0.1 % nasal spray Spray 1 spray into each nostril 2 times daily. Use in each nostril as directed 1 bottle 3 Not Taking    Cholecalciferol 2000 UNITS CAPS Take 1 capsule by mouth daily.   Taking    digoxin (LANOXIN) 0.125 MG tablet Take 1 tablet (125 mcg) by mouth every evening. 30 tablet 3 Taking    Estradiol (DIVIGEL) 0.25 MG/0.25GM GEL Apply 0.25 mg topically nightly.   Taking    estradiol (ESTRING) 2 MG vaginal ring Insert 1 Ring vaginally Every 3 months. Follow package directions   Taking    ferrous sulfate 324 (65 FE) MG TBEC Take 1 tablet (324 mg) by mouth daily. 30 tablet 0 Taking    FLOLAN 1.5 MG IV SOLR 75IE/PP/IRJ   Taking    FOLIC ACID 188 MCG OR TABS 1 TABLET DAILY   Taking    furosemide (LASIX) 40 MG tablet Take 2.5 tablets (100 mg) by mouth 3 times daily. 200 tablet 3 Taking    medroxyPROGESTERone (PROVERA) 2.5 MG tablet Take 1.25 mg by mouth daily.   Taking    potassium chloride (KLOR-CON) 10 MEQ tablet TAKE 4 TABLETS BY MOUTH DAILY. 120 each 0 Taking    [EXPIRED] sildenafil (REVATIO, VIAGRA) 20 MG tablet TAKE FOUR TABLETS BY MOUTH  THREE TIMES DAILY 360 each 0 Taking    spironolactone (ALDACTONE) 50 MG tablet TAKE 1 TABLET BY MOUTH DAILY. (Patient taking differently: Take 75 mg by mouth.  ) 30 each 0 Taking    vitamin b-12 (CYANOCOBALAMIN) 500 MCG tablet TAKE 1 TABLET BY MOUTH DAILY. 30 each 3 Taking     Physical Exam:  BP 105/58 (BP Location: Right arm, BP Patient Position: Semi-Fowlers)    Pulse 92    Temp 98.2 F (36.8 C)    Resp 20    Ht 5\' 2"  (1.575 m)    Wt 65.9 kg (145 lb 3.2 oz)    SpO2 99%    BMI 26.56 kg/m   HEENT: NC/AT  General: NAD, stting in chair,  Eyes: Anicetric sclera  Mouth: MMM  CV: RRR  Pulm: Decrease breathing sounds, no crackles.   Abd:  Soft, non tender,  distended  GU: Foley  Ext: 4 mm edema on both LE.     Labs:  Lab Results   Component Value Date    NA 135 (L) 06/02/2017    K 3.4 (L) 06/02/2017    CL 96 (L)  06/02/2017    BICARB 22 06/02/2017    BUN 92 (H) 06/02/2017    CREAT 2.51 (H) 06/02/2017    GLU 84 06/02/2017    [place] 8.4 (L) 06/02/2017     No results found for: WBC, HGB, HCT, PLT, SEG, BAND, LYMPHS, MONOS, EOS, NRBC    Assessment and Recommendations:    # AKI on CKD in the setting of PAH/rt sided HF, responding well to diuretics with Net negative almost every day since admission but weight is unchanged despite having unmeasured stools on top of the UOP which would make surreptitious fluid intake possible though pt denies that  -Would do fluid restriction to 1.5 or 2 lit a day to track fluid intake better.   -UA is bland, no evidence of any active inflammatory process in kidneys and UA has been same for years despite using steroids intermittently with improvement in kidney function and UOP.   -UPCR ~ 0.3   -We do not think there is any nephritis or CTD involvement in her kidney given no evidence of inflammation on urine sediment.   -No indication to give steroid from Kidney standpoint   -Continue to diurese and kidney function is expected to fluctuate with current hemodynamics.     # Lytes: acceptable. Getting  K replacement.   # Volume: Hypervolemic, diuresis per primary team to keep net negative 1-2 lit. Continue with Lasix IV and diuril.   Currently on lasix IV 100 QID; would restrict fluid intake to 1.5 lit a day.   # Acid-Base: acceptable.   # MBD: phos slightly elevated, continue renal diet and monitor for now.     The above plan was discussed with the attending, Dr. Filomena Jungling    Florene Glen, MD  Nephrology Fellow, PGY 4  Division of Nephrology-Hypertension   Pager: [phone]

## 2017-06-02 NOTE — Interdisciplinary (Signed)
06/02/17 1033   Follow Up/Progress   Is the Patient Ready for Discharge No   Supplies/Services  IV infusion/antibiotics   Barriers to Discharge Awaiting clinical improvement   Patient/Family/Legal/Surrogate Decision Maker Has Been Given Options And Choice In The Selection of Post-Acute Care Providers Yes   Family/Caregiver's Assessed for Readiness, willingness, and ability to provide or support self-management activities   Respite Care Not Applicable   Patient/Family Are In Agreement With Discharge Plan Yes   Public Health Clearance Needed Not Applicable   Plan/Interventions Explore needs and options for aftercare, provide referrals;Ongoing weekly therapeutic interventions     Medical necessity Reason for continued Hospital Stay:   acute on chronic right heart failure and volume overload as well as AKI and thrombocytopenia.Persistent edema.  ?  Interventions requiring continued Hospitalization/Plan of Care:   - Cont lasix IVP diuresis  - Cont dopamine gtt  - Continuous supplemental O2 @ 3L per NC   - Aggressive diuresis w/ diuril  - Cont flolan gtt  ?  Anticipated discharge plan at this time (home, SNF, etc.):  Home w/ HH  ?  Barriers to Discharge:  None.   ?  Expected DC Date:   TBD.    Pt is currently on service with [employer], for Flolan gtt.  Pt on service with [employer], for Oxygen DME.  CM to confirm service resumption with both companies, upon discharge.  CM to follow clinical course and assess for any other DC needs.

## 2017-06-02 NOTE — Progress Notes (Signed)
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 failure on 3Lpm now here with worsening LEx edema, AKI, now on dopamine    Review of Systems:   All other systems were reviewed and are negative except as noted above    Current Medications:   chlorothiazide  500 mg Q12H    furosemide (LASIX) IVPB (doses > 80 mg)  100 mg 4x Daily    medroxyPROGESTERone  1.25 mg Q24H NR    nystatin  5 mL 4x Daily    potassium chloride  40 mEq BID    Prostacyclin Cassette Change   Daily    Prostacyclin Tubing   Once per day on Mon Wed Fri    Prostacylin Batteries   Once per day on Mon    sildenafil  80 mg TID    sodium chloride (PF)  3 mL Q8H    spironolactone  75 mg Daily      DOPamine 2 mcg/kg/min (06/01/17 2000)    epoprostenol (FLOLAN) infusion 35 ng/kg/min (06/02/17 1338)    sodium chloride        acetaminophen  650 mg Q6H PRN    loperamide  2 mg TID PRN    nalOXone  0.1 mg Q2 Min PRN    sodium chloride (PF)  3 mL PRN    sodium chloride   Continuous PRN       Vital Signs:  Temperature:  [97.8 F (36.6 C)-98.3 F (36.8 C)] 98.2 F (36.8 C) (05/21 1508)  Blood pressure (BP): (105-121)/(58-71) 105/58 (05/21 1508)  Heart Rate:  [91-96] 92 (05/21 1508)  Respirations:  [17-20] 20 (05/21 1508)  Pain Score: 0 (05/21 1508)  O2 Device: Nasal cannula (05/21 0759)  O2 Flow Rate (L/min):  [3 l/min] 3 l/min (05/21 0759)  SpO2:  [91 %-99 %] 99 % (05/21 1508)  Wt Readings from Last 1 Encounters:   06/02/17 65.9 kg (145 lb 3.2 oz)     Intake/Output (Current Shift):  05/20 0600 - 05/21 0559  In: 860 [P.O.:860]  Out: 2453 [Urine:2450]  Stool x4 today    Physical Exam:  Gen: A&O, conversing comfortably  Eyes: EOMI, pupils equal, anicteric  HENT: Atraumatic, normocephalic  Neck: + JVD, trachea midline  CV: RRR, +murmur, accentuated S2, No R/G/S3. L tunneled IJ CVC site is CDI  Resp: stable  bibasilar crackles, no intercostal retractions/normal effort  Abdo: NT mild D, soft, no masses.  Ext: +2 BLEx edema  Neuro: No focal weakness or sensory deficit, CN grossly intact  Psych: Appropriate affect and mood, good insight. AnOx3  Skin: Normal temperature, no rash or nodules. Diffuse erythema, likely related to IV Epo; + brusing.     LABS:  Lab Results   Component Value Date    NA 135 (L) 06/02/2017    K 3.4 (L) 06/02/2017    CL 96 (L) 06/02/2017    BICARB 22 06/02/2017    BUN 92 (H) 06/02/2017    CREAT 2.51 (H) 06/02/2017    GLU 84 06/02/2017    [place] 8.4 (L) 06/02/2017     ASSESSMENT AND PLAN  67 yo F with WHO I PAH associated with SLE and prior anorexogen use admitted with acute on chronic right heart failure and volume overload as well as AKI and thrombocytopenia.Persistent edema.  No progress on exam or weight despite negative 14 L by I/O.    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  - Diuril 500 mg IV BID  - Continue Spironolactone  - Continue Dopamine 2 mcg/kg/min to help with diuresis  - Continue home sildenafil and flolan at 35 ng/kg/min  - Continue oxygen suppl  -Appreciate renal consult.     [name] MD  Pager [phone]

## 2017-06-03 LAB — BASIC METABOLIC PANEL, BLOOD
Anion Gap: 14 mmol/L (ref 7–15)
Anion Gap: 15 mmol/L (ref 7–15)
BUN: 96 mg/dL — ABNORMAL HIGH (ref 8–23)
BUN: 97 mg/dL — ABNORMAL HIGH (ref 8–23)
Bicarbonate: 22 mmol/L (ref 22–29)
Bicarbonate: 21 mmol/L — ABNORMAL LOW (ref 22–29)
Calcium: 8.4 mg/dL — ABNORMAL LOW (ref 8.5–10.6)
Calcium: 8 mg/dL — ABNORMAL LOW (ref 8.5–10.6)
Chloride: 97 mmol/L — ABNORMAL LOW (ref 98–107)
Chloride: 98 mmol/L (ref 98–107)
Creatinine: 2.33 mg/dL — ABNORMAL HIGH (ref 0.51–0.95)
Creatinine: 2.4 mg/dL — ABNORMAL HIGH (ref 0.51–0.95)
GFR: 21 mL/min
GFR: 20 mL/min
Glucose: 79 mg/dL (ref 70–99)
Glucose: 94 mg/dL (ref 70–99)
Potassium: 3.8 mmol/L (ref 3.5–5.1)
Potassium: 3.8 mmol/L (ref 3.5–5.1)
Sodium: 133 mmol/L — ABNORMAL LOW (ref 136–145)
Sodium: 134 mmol/L — ABNORMAL LOW (ref 136–145)

## 2017-06-03 LAB — CBC WITH DIFF, BLOOD
ANC-Manual Mode: 2.3 10*3/uL (ref 1.6–7.0)
Abs Basophils: 0 10*3/uL (ref <0.1)
Abs Eosinophils: 0 10*3/uL (ref 0.1–0.5)
Abs Lymphs: 0.9 10*3/uL (ref 0.8–3.1)
Abs Monos: 0.1 10*3/uL — ABNORMAL LOW (ref 0.2–0.8)
Basophils: 0 %
Eosinophils: 1 %
Hct: 27.2 % — ABNORMAL LOW (ref 34.0–45.0)
Hgb: 8.6 gm/dL — ABNORMAL LOW (ref 11.2–15.7)
Lymphocytes: 27 %
MCH: 24.9 pg — ABNORMAL LOW (ref 26.0–32.0)
MCHC: 31.6 g/dL — ABNORMAL LOW (ref 32.0–36.0)
MCV: 78.6 um3 — ABNORMAL LOW (ref 79.0–95.0)
Monocytes: 4 %
Plt Count: 69 10*3/uL — ABNORMAL LOW (ref 140–370)
RBC: 3.46 10*6/uL — ABNORMAL LOW (ref 3.90–5.20)
RDW: 18.6 % — ABNORMAL HIGH (ref 12.0–14.0)
Segs: 65 %
WBC: 3.5 10*3/uL — ABNORMAL LOW (ref 4.0–10.0)

## 2017-06-03 LAB — MDIFF
Immature Granulocytes Absolute Manual: 0.1 10*3/uL (ref 0.0–0.1)
Metamyelocytes: 1 %
Myelocytes: 2 %
Number of Cells Counted: 114
Plt Est: DECREASED

## 2017-06-03 LAB — MAGNESIUM, BLOOD
Magnesium: 2.3 mg/dL (ref 1.6–2.4)
Magnesium: 2.3 mg/dL (ref 1.6–2.4)

## 2017-06-03 LAB — PHOSPHORUS, BLOOD: Phosphorous: 5.8 mg/dL — ABNORMAL HIGH (ref 2.7–4.5)

## 2017-06-03 MED ORDER — FUROSEMIDE 10 MG/ML IJ SOLN
100.00 mg | Freq: Once | INTRAMUSCULAR | Status: AC
Start: 2017-06-03 — End: 2017-06-03
  Administered 2017-06-03 (×2): 100 mg via INTRAVENOUS
  Filled 2017-06-03: qty 10

## 2017-06-03 MED ORDER — SODIUM CHLORIDE 0.9 % IV SOLN
20.00 mg/h | INTRAVENOUS | Status: DC
Start: 2017-06-03 — End: 2017-06-07
  Administered 2017-06-03 – 2017-06-07 (×21): 20 mg/h via INTRAVENOUS
  Filled 2017-06-03: qty 25
  Filled 2017-06-03: qty 21
  Filled 2017-06-03 (×6): qty 25
  Filled 2017-06-03: qty 20

## 2017-06-03 NOTE — Plan of Care (Signed)
Problem: Promotion of Health and Safety  Goal: Promotion of Health and Safety  The patient remains safe, receives appropriate treatment and achieves optimal outcomes (physically, psychosocially, and spiritually) within the limitations of the disease process by discharge.    Information below is the current care plan.  Outcome: Progressing   06/03/17 1419   Patient /Family stated Goal   Patient /Family stated Goal get the fluid off   Adult/Peds Plan of Care   Guidelines Inpatient Nursing Guidelines   Individualized Interventions/Recommendations #1 Mtr VS- notify treatment team of changes in trends.    Individualized Interventions/Recommendations #2 (if applicable) Mtr continuous infusions: dopamine, lasix, and flolan.    Individualized Interventions/Recommendations #3 (if applicable) Continue strict I&O's, mtr electrolytes while contonuing to diurese patient.    Outcome Evaluation (rationale for progressing/not progressing) every shift VSS, pt remains hemodynamically stable. Denies distress, frequent urination during transition to continuous infusion. Electrolytes stable today, will draw labs again this afternoon.

## 2017-06-03 NOTE — Progress Notes (Signed)
NEPHROLOGY INPATIENT PROGRESS NOTE    Diana Chambers is a 67 year old female with hx of Group 1 PAH associated with SLE and /Sjogren's disease, presented to the hospital with worsening legs swelling. 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        Events:   Diuresing well with 3.2 lit  NAEO    Subjective/ROS  Pt feels better today, no Chest pain or SOB, wants to go home.       Allergies:  Allergies   Allergen Reactions    Allopurinol Hives    Levaquin Swelling and Other     Severe joint pain      Ofloxacin Nausea and Vomiting    Chlorhexidine Itching    Sulfa Drugs Unspecified       Medications:  Medications Prior to Admission   Medication Sig Dispense Refill Last Dose    azelastine (ASTELIN) 0.1 % nasal spray Spray 1 spray into each nostril 2 times daily. Use in each nostril as directed 1 bottle 3 Not Taking    Cholecalciferol 2000 UNITS CAPS Take 1 capsule by mouth daily.   Taking    digoxin (LANOXIN) 0.125 MG tablet Take 1 tablet (125 mcg) by mouth every evening. 30 tablet 3 Taking    Estradiol (DIVIGEL) 0.25 MG/0.25GM GEL Apply 0.25 mg topically nightly.   Taking    estradiol (ESTRING) 2 MG vaginal ring Insert 1 Ring vaginally Every 3 months. Follow package directions   Taking    ferrous sulfate 324 (65 FE) MG TBEC Take 1 tablet (324 mg) by mouth daily. 30 tablet 0 Taking    FLOLAN 1.5 MG IV SOLR 50DT/OI/ZTI   Taking    FOLIC ACID 458 MCG OR TABS 1 TABLET DAILY   Taking    furosemide (LASIX) 40 MG tablet Take 2.5 tablets (100 mg) by mouth 3 times daily. 200 tablet 3 Taking    medroxyPROGESTERone (PROVERA) 2.5 MG tablet Take 1.25 mg by mouth daily.   Taking    potassium chloride (KLOR-CON) 10 MEQ tablet TAKE 4 TABLETS BY MOUTH DAILY. 120 each 0 Taking    [EXPIRED] sildenafil (REVATIO, VIAGRA) 20 MG tablet TAKE FOUR TABLETS BY MOUTH THREE TIMES DAILY 360 each 0 Taking    spironolactone  (ALDACTONE) 50 MG tablet TAKE 1 TABLET BY MOUTH DAILY. (Patient taking differently: Take 75 mg by mouth.  ) 30 each 0 Taking    vitamin b-12 (CYANOCOBALAMIN) 500 MCG tablet TAKE 1 TABLET BY MOUTH DAILY. 30 each 3 Taking     Physical Exam:  BP 118/61    Pulse 91    Temp 98.1 F (36.7 C)    Resp 20    Ht 5\' 2"  (1.575 m)    Wt 66 kg (145 lb 9.6 oz)    SpO2 97%    BMI 26.63 kg/m   HEENT: NC/AT  General: NAD, stting in chair,  Eyes: Anicetric sclera  Mouth: MMM  CV: RRR  Pulm: Decrease breathing sounds at bases, no crackles.   Abd:  Soft, non tender,  distended  GU: Foley  Ext: 4 mm edema on both LE. Improved today    Labs:  Lab Results   Component Value Date    NA 133 (L) 06/03/2017    K 3.8 06/03/2017    CL 97 (L) 06/03/2017    BICARB 22 06/03/2017    BUN 96 (H) 06/03/2017  CREAT 2.33 (H) 06/03/2017    GLU 79 06/03/2017    [place] 8.4 (L) 06/03/2017     Lab Results   Component Value Date    WBC 3.5 (L) 06/03/2017    HGB 8.6 (L) 06/03/2017    HCT 27.2 (L) 06/03/2017    PLT 69 (L) 06/03/2017    SEG 65 06/03/2017    LYMPHS 27 06/03/2017    MONOS 4 06/03/2017    EOS 1 06/03/2017       Assessment and Recommendations:    # AKI on CKD in the setting of PAH/rt sided HF, responding well to diuretics with Net negative almost every day since admission but weight is unchanged despite having unmeasured stools on top of the UOP which would make surreptitious fluid intake possible though pt denies that  -Would do fluid restriction to 1.5 or 2 lit a day to track fluid intake better.   -UA is bland, no evidence of any active inflammatory process in kidneys and UA has been same for years despite using steroids intermittently with improvement in kidney function and UOP.   -UPCR ~ 0.3   -We do not think there is any nephritis or CTD involvement in her kidney given no evidence of inflammation on urine sediment.    -Continue to diurese and kidney function is expected to fluctuate with current hemodynamics. Cr is better today.   # Lytes:  acceptable. Getting K replacement.   # Volume: Hypervolemic, diuresis per primary team to keep net negative 1-2 lit. Currently on lasix IV 100 QID; would restrict fluid intake to 1.5 lit a day.   # Acid-Base: acceptable.   # MBD: phos slightly elevated, continue renal diet and monitor for now.     The above plan was discussed with the attending, Dr. Filomena Jungling    Florene Glen, MD  Nephrology Fellow, PGY 4  Division of Nephrology-Hypertension   Pager: [phone]

## 2017-06-03 NOTE — Progress Notes (Signed)
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 failure on 3Lpm now here with worsening LEx edema, AKI, now on dopamine    Review of Systems:   All other systems were reviewed and are negative except as noted above    Current Medications:   chlorothiazide  500 mg Q12H    medroxyPROGESTERone  1.25 mg Q24H NR    nystatin  5 mL 4x Daily    potassium chloride  40 mEq BID    Prostacyclin Cassette Change   Daily    Prostacyclin Tubing   Once per day on Mon Wed Fri    Prostacylin Batteries   Once per day on Mon    sildenafil  80 mg TID    sodium chloride (PF)  3 mL Q8H    spironolactone  75 mg Daily      DOPamine 2 mcg/kg/min (06/03/17 1200)    epoprostenol (FLOLAN) infusion 35 ng/kg/min (06/03/17 1316)    furosemide (LASIX) infusion 20 mg/hr (06/03/17 1247)    sodium chloride        acetaminophen  650 mg Q6H PRN    loperamide  2 mg TID PRN    nalOXone  0.1 mg Q2 Min PRN    sodium chloride (PF)  3 mL PRN    sodium chloride   Continuous PRN       Vital Signs:  Temperature:  [98.1 Chambers (36.7 C)-98.5 Chambers (36.9 C)] 98.1 Chambers (36.7 C) (05/22 1139)  Blood pressure (BP): (105-133)/(54-77) 118/61 (05/22 1247)  Heart Rate:  [90-104] 91 (05/22 1247)  Respirations:  [18-20] 20 (05/22 1139)  Pain Score: 0 (05/22 1139)  O2 Device: Nasal cannula (05/22 1150)  O2 Flow Rate (L/min):  [3 l/min] 3 l/min (05/22 1150)  SpO2:  [95 %-99 %] 97 % (05/22 1150)  Wt Readings from Last 1 Encounters:   06/03/17 66 kg (145 lb 9.6 oz)     Intake/Output (Current Shift):  05/Diana 0600 - 05/22 0559  In: 1612.2 [P.O.:1540; I.V.:72.2]  Out: 3230 [Urine:3230]  Stool x4 today    Physical Exam:  Gen: A&O, conversing comfortably  Eyes: EOMI, pupils equal, anicteric  HENT: Atraumatic, normocephalic  Neck: + JVD, trachea midline  CV: RRR, +murmur, accentuated S2, No R/G/S3. L tunneled IJ CVC site is  CDI  Resp: stable bibasilar crackles, no intercostal retractions/normal effort  Abdo: NT mild D, soft, no masses.  Ext: +2 BLEx edema  Neuro: No focal weakness or sensory deficit, CN grossly intact  Psych: Appropriate affect and mood, good insight. AnOx3  Skin: Normal temperature, no rash or nodules. Diffuse erythema, likely related to IV Epo; + brusing.     LABS:  Lab Results   Component Value Date    NA 133 (L) 06/03/2017    K 3.8 06/03/2017    CL 97 (L) 06/03/2017    BICARB 22 06/03/2017    BUN 96 (H) 06/03/2017    CREAT 2.33 (H) 06/03/2017    GLU 79 06/03/2017    [place] 8.4 (L) 06/03/2017     ASSESSMENT AND PLAN  Diana Chambers with WHO I PAH associated with SLE and prior anorexogen use admitted with acute on chronic right heart failure and volume overload as well as AKI and thrombocytopenia.Persistent edema.  No progress on exam or  weight despite negative 17 L by I/O.    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

## 2017-06-03 NOTE — Plan of Care (Signed)
Problem: Promotion of Health and Safety  Goal: Promotion of Health and Safety  The patient remains safe, receives appropriate treatment and achieves optimal outcomes (physically, psychosocially, and spiritually) within the limitations of the disease process by discharge.    Information below is the current care plan.  Outcome: Progressing   06/02/17 0529 06/02/17 2159 06/03/17 1419   Patient /Family stated Goal   Patient /Family stated Goal --  --  get the fluid off   Adult/Peds Plan of Care   Guidelines --  --  Inpatient Nursing Guidelines   Individualized Interventions/Recommendations #1 --  --  Mtr VS- notify treatment team of changes in trends.    Individualized Interventions/Recommendations #2 (if applicable) --  --  Mtr continuous infusions: dopamine, lasix, and flolan.    Individualized Interventions/Recommendations #3 (if applicable) --  --  Continue strict I&O's, mtr electrolytes while contonuing to diurese patient.    Individualized Interventions/Recommendations #4 (if applicable) --  monitor dopamine/ flolan gtt --    Individualized Interventions/Recommendations #5 (if applicable) monitor renal function --  --    Outcome Evaluation (rationale for progressing/not progressing) every shift --  --  VSS, pt remains hemodynamically stable. Denies distress, frequent urination during transition to continuous infusion. Electrolytes stable today, will draw labs again this afternoon.        Comments: Pt with ongoing dopamine/ lasix/ flolan gtt ( see MAR), flolan gtt infusion via CADD pump thru broviac line, extra CADD pump at bedside, tele SR with 1avb, pt denies any CP, still noted with anasarca, LE swelling, K 3.8, KCL replacement, to continue plan of care

## 2017-06-04 LAB — BASIC METABOLIC PANEL, BLOOD
Anion Gap: 16 mmol/L — ABNORMAL HIGH (ref 7–15)
Anion Gap: 15 mmol/L (ref 7–15)
BUN: 92 mg/dL — ABNORMAL HIGH (ref 8–23)
BUN: 103 mg/dL — ABNORMAL HIGH (ref 8–23)
Bicarbonate: 22 mmol/L (ref 22–29)
Bicarbonate: 23 mmol/L (ref 22–29)
Calcium: 8.3 mg/dL — ABNORMAL LOW (ref 8.5–10.6)
Calcium: 8.5 mg/dL (ref 8.5–10.6)
Chloride: 95 mmol/L — ABNORMAL LOW (ref 98–107)
Chloride: 96 mmol/L — ABNORMAL LOW (ref 98–107)
Creatinine: 2.24 mg/dL — ABNORMAL HIGH (ref 0.51–0.95)
Creatinine: 2.48 mg/dL — ABNORMAL HIGH (ref 0.51–0.95)
GFR: 22 mL/min
GFR: 19 mL/min
Glucose: 82 mg/dL (ref 70–99)
Glucose: 142 mg/dL — ABNORMAL HIGH (ref 70–99)
Potassium: 3.6 mmol/L (ref 3.5–5.1)
Potassium: 4 mmol/L (ref 3.5–5.1)
Sodium: 133 mmol/L — ABNORMAL LOW (ref 136–145)
Sodium: 134 mmol/L — ABNORMAL LOW (ref 136–145)

## 2017-06-04 LAB — MAGNESIUM, BLOOD
Magnesium: 2 mg/dL (ref 1.6–2.4)
Magnesium: 2.2 mg/dL (ref 1.6–2.4)

## 2017-06-04 LAB — PHOSPHORUS, BLOOD: Phosphorous: 5.3 mg/dL — ABNORMAL HIGH (ref 2.7–4.5)

## 2017-06-04 NOTE — Progress Notes (Signed)
NEPHROLOGY INPATIENT PROGRESS NOTE    Diana Chambers is a 67 year old female with hx of Group 1 PAH associated with SLE and /Sjogren's disease, presented to the hospital with worsening legs swelling. 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        Events:   Switched to Lasix drip at 20 mg/hr  UOP great       Subjective/ROS  Pt feels better every day. No chest pain or SOB.       Allergies:  Allergies   Allergen Reactions    Allopurinol Hives    Levaquin Swelling and Other     Severe joint pain      Ofloxacin Nausea and Vomiting    Chlorhexidine Itching    Sulfa Drugs Unspecified       Medications:  Medications Prior to Admission   Medication Sig Dispense Refill Last Dose    azelastine (ASTELIN) 0.1 % nasal spray Spray 1 spray into each nostril 2 times daily. Use in each nostril as directed 1 bottle 3 Not Taking    Cholecalciferol 2000 UNITS CAPS Take 1 capsule by mouth daily.   Taking    digoxin (LANOXIN) 0.125 MG tablet Take 1 tablet (125 mcg) by mouth every evening. 30 tablet 3 Taking    Estradiol (DIVIGEL) 0.25 MG/0.25GM GEL Apply 0.25 mg topically nightly.   Taking    estradiol (ESTRING) 2 MG vaginal ring Insert 1 Ring vaginally Every 3 months. Follow package directions   Taking    ferrous sulfate 324 (65 FE) MG TBEC Take 1 tablet (324 mg) by mouth daily. 30 tablet 0 Taking    FLOLAN 1.5 MG IV SOLR 16WF/UX/NAT   Taking    FOLIC ACID 557 MCG OR TABS 1 TABLET DAILY   Taking    furosemide (LASIX) 40 MG tablet Take 2.5 tablets (100 mg) by mouth 3 times daily. 200 tablet 3 Taking    medroxyPROGESTERone (PROVERA) 2.5 MG tablet Take 1.25 mg by mouth daily.   Taking    potassium chloride (KLOR-CON) 10 MEQ tablet TAKE 4 TABLETS BY MOUTH DAILY. 120 each 0 Taking    [EXPIRED] sildenafil (REVATIO, VIAGRA) 20 MG tablet TAKE FOUR TABLETS BY MOUTH THREE TIMES DAILY 360 each 0 Taking    spironolactone  (ALDACTONE) 50 MG tablet TAKE 1 TABLET BY MOUTH DAILY. (Patient taking differently: Take 75 mg by mouth.  ) 30 each 0 Taking    vitamin b-12 (CYANOCOBALAMIN) 500 MCG tablet TAKE 1 TABLET BY MOUTH DAILY. 30 each 3 Taking     Physical Exam:  BP 108/60 (BP Location: Right arm, BP Patient Position: Semi-Fowlers)    Pulse 92    Temp 98 F (36.7 C)    Resp 20    Ht 5\' 2"  (1.575 m)    Wt 65.6 kg (144 lb 10 oz)    SpO2 98%    BMI 26.45 kg/m   HEENT: NC/AT  General: NAD, stting in chair,  Eyes: Anicetric sclera  Mouth: MMM  CV: RRR  Pulm: Better air entry, no crackles   Abd:  Soft, non tender,  distended  GU: Foley  Ext: 3 mm edema on both LE. No hip edema.     Labs:  Lab Results   Component Value Date    NA 133 (L) 06/04/2017    K 3.6 06/04/2017    CL 95 (L) 06/04/2017    BICARB 22  06/04/2017    BUN 92 (H) 06/04/2017    CREAT 2.24 (H) 06/04/2017    GLU 82 06/04/2017    [place] 8.3 (L) 06/04/2017     Lab Results   Component Value Date    WBC 3.5 (L) 06/03/2017    HGB 8.6 (L) 06/03/2017    HCT 27.2 (L) 06/03/2017    PLT 69 (L) 06/03/2017    SEG 65 06/03/2017    LYMPHS 27 06/03/2017    MONOS 4 06/03/2017    EOS 1 06/03/2017       Assessment and Recommendations:    # AKI on CKD in the setting of PAH/rt sided HF, responding well to diuretics with Net negative every day   -Would do fluid restriction to 1.5 or 2 lit a day to track fluid intake better.   -UA is bland, no evidence of any active inflammatory process in kidneys and UA has been same for years despite using steroids intermittently with improvement in kidney function and UOP.   -UPCR ~ 0.3 which is better than before  -We do not think there is any nephritis or CTD involvement in her kidney given no evidence of inflammation on urine sediment.    -Continue to diurese and kidney function is expected to fluctuate with current hemodynamics. Cr is better today.   # Lytes: acceptable. Getting K replacement.   # Volume: Hypervolemic, diuresis per primary team to keep net negative  1-2 lit. Switched to lasix drip   # Acid-Base: acceptable.   # MBD: phos slightly elevated, continue renal diet and monitor for now.     The above plan was discussed with the attending, Dr. Filomena Jungling    Florene Glen, MD  Nephrology Fellow, PGY 4  Division of Nephrology-Hypertension   Pager: [phone]

## 2017-06-04 NOTE — Progress Notes (Signed)
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 failure on 3Lpm now here with worsening LEx edema, AKI, now on dopamine    Review of Systems:   All other systems were reviewed and are negative except as noted above    Current Medications:   chlorothiazide  500 mg Q12H    medroxyPROGESTERone  1.25 mg Q24H NR    nystatin  5 mL 4x Daily    potassium chloride  40 mEq BID    Prostacyclin Cassette Change   Daily    Prostacyclin Tubing   Once per day on Mon Wed Fri    Prostacylin Batteries   Once per day on Mon    sildenafil  80 mg TID    sodium chloride (PF)  3 mL Q8H    spironolactone  75 mg Daily      DOPamine 2 mcg/kg/min (06/04/17 1600)    epoprostenol (FLOLAN) infusion 35 ng/kg/min (06/04/17 1600)    furosemide (LASIX) infusion 20 mg/hr (06/04/17 1600)    sodium chloride        acetaminophen  650 mg Q6H PRN    loperamide  2 mg TID PRN    nalOXone  0.1 mg Q2 Min PRN    sodium chloride (PF)  3 mL PRN    sodium chloride   Continuous PRN       Vital Signs:  Temperature:  [97.2 F (36.2 C)-98.7 F (37.1 C)] 98.7 F (37.1 C) (05/23 1636)  Blood pressure (BP): (103-136)/(57-74) 103/57 (05/23 1636)  Heart Rate:  [91-102] 94 (05/23 1636)  Respirations:  [20-22] 20 (05/23 1636)  Pain Score: 0 (05/23 1559)  O2 Device: Nasal cannula (05/23 1559)  O2 Flow Rate (L/min):  [3 l/min] 3 l/min (05/23 1559)  SpO2:  [96 %-99 %] 98 % (05/23 1636)  Wt Readings from Last 1 Encounters:   06/04/17 65.6 kg (144 lb 10 oz)     Intake/Output (Current Shift):  05/22 0600 - 05/23 0559  In: 1624.8 [P.O.:1120; I.V.:504.8]  Out: 2925 [Urine:2925]  Stool x4 today    Physical Exam:  Gen: A&O, conversing comfortably  Eyes: EOMI, pupils equal, anicteric  HENT: Atraumatic, normocephalic  Neck: + JVD, trachea midline  CV: RRR, +murmur, accentuated S2, No R/G/S3. L tunneled IJ CVC site is  CDI  Resp: stable bibasilar crackles, no intercostal retractions/normal effort  Abdo: NT mild D, soft, no masses.  Ext: +2 BLEx edema  Neuro: No focal weakness or sensory deficit, CN grossly intact  Psych: Appropriate affect and mood, good insight. AnOx3  Skin: Normal temperature, no rash or nodules. Diffuse erythema, likely related to IV Epo; + brusing.     LABS:  Lab Results   Component Value Date    NA 134 (L) 06/04/2017    K 4.0 06/04/2017    CL 96 (L) 06/04/2017    BICARB 23 06/04/2017    BUN 103 (H) 06/04/2017    CREAT 2.48 (H) 06/04/2017    GLU 142 (H) 06/04/2017    [place] 8.5 06/04/2017     ASSESSMENT AND PLAN  67 yo F with WHO I PAH associated with SLE and prior anorexogen use admitted with acute on chronic right heart failure and volume overload as well as AKI and thrombocytopenia.Persistent edema.  Minimal progress on exam and  weight despite negative 18 L by I/O.    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

## 2017-06-04 NOTE — Plan of Care (Signed)
Problem: Promotion of Health and Safety  Goal: Promotion of Health and Safety  The patient remains safe, receives appropriate treatment and achieves optimal outcomes (physically, psychosocially, and spiritually) within the limitations of the disease process by discharge.    Information below is the current care plan.  Outcome: Progressing   06/03/17 1419 06/04/17 1112   Adult/Peds Plan of Care   Guidelines --  Inpatient Nursing Guidelines   Individualized Interventions/Recommendations #1 Mtr VS- notify treatment team of changes in trends.  --    Individualized Interventions/Recommendations #2 (if applicable) Mtr continuous infusions: dopamine, lasix, and flolan.  --    Individualized Interventions/Recommendations #3 (if applicable) Continue strict I&O's, mtr electrolytes while contonuing to diurese patient.  --    Outcome Evaluation (rationale for progressing/not progressing) every shift --  Pt A&Ox4, VSS, pt remains hemodynamically stable. Urinary frequency d/t aggressive diuresing. Labs stable, will f/u this afternoon. Will continue to mtr.

## 2017-06-05 LAB — CBC WITH DIFF, BLOOD
ANC-Automated: 1.9 10*3/uL (ref 1.6–7.0)
Abs Basophils: 0 10*3/uL (ref <0.1)
Abs Eosinophils: 0 10*3/uL (ref 0.1–0.5)
Abs Lymphs: 1 10*3/uL (ref 0.8–3.1)
Abs Monos: 0.2 10*3/uL (ref 0.2–0.8)
Basophils: 1 %
Eosinophils: 1 %
Hct: 27.7 % — ABNORMAL LOW (ref 34.0–45.0)
Hgb: 8.5 gm/dL — ABNORMAL LOW (ref 11.2–15.7)
Imm Gran %: 3 % — ABNORMAL HIGH (ref <1)
Imm Gran Abs: 0.1 10*3/uL (ref <0.1)
Lymphocytes: 31 %
MCH: 24.5 pg — ABNORMAL LOW (ref 26.0–32.0)
MCHC: 30.7 g/dL — ABNORMAL LOW (ref 32.0–36.0)
MCV: 79.8 um3 (ref 79.0–95.0)
Monocytes: 7 %
Plt Count: 66 10*3/uL — ABNORMAL LOW (ref 140–370)
RBC: 3.47 10*6/uL — ABNORMAL LOW (ref 3.90–5.20)
RDW: 18.6 % — ABNORMAL HIGH (ref 12.0–14.0)
Segs: 57 %
WBC: 3.3 10*3/uL — ABNORMAL LOW (ref 4.0–10.0)

## 2017-06-05 LAB — BASIC METABOLIC PANEL, BLOOD
Anion Gap: 15 mmol/L (ref 7–15)
Anion Gap: 15 mmol/L (ref 7–15)
BUN: 100 mg/dL — ABNORMAL HIGH (ref 8–23)
BUN: 102 mg/dL — ABNORMAL HIGH (ref 8–23)
Bicarbonate: 23 mmol/L (ref 22–29)
Bicarbonate: 22 mmol/L (ref 22–29)
Calcium: 8.1 mg/dL — ABNORMAL LOW (ref 8.5–10.6)
Calcium: 7.8 mg/dL — ABNORMAL LOW (ref 8.5–10.6)
Chloride: 95 mmol/L — ABNORMAL LOW (ref 98–107)
Chloride: 95 mmol/L — ABNORMAL LOW (ref 98–107)
Creatinine: 2.48 mg/dL — ABNORMAL HIGH (ref 0.51–0.95)
Creatinine: 2.41 mg/dL — ABNORMAL HIGH (ref 0.51–0.95)
GFR: 19 mL/min
GFR: 20 mL/min
Glucose: 101 mg/dL — ABNORMAL HIGH (ref 70–99)
Glucose: 101 mg/dL — ABNORMAL HIGH (ref 70–99)
Potassium: 3.9 mmol/L (ref 3.5–5.1)
Potassium: 4.2 mmol/L (ref 3.5–5.1)
Sodium: 133 mmol/L — ABNORMAL LOW (ref 136–145)
Sodium: 132 mmol/L — ABNORMAL LOW (ref 136–145)

## 2017-06-05 LAB — PHOSPHORUS, BLOOD: Phosphorous: 5 mg/dL — ABNORMAL HIGH (ref 2.7–4.5)

## 2017-06-05 LAB — MAGNESIUM, BLOOD
Magnesium: 2.2 mg/dL (ref 1.6–2.4)
Magnesium: 2.1 mg/dL (ref 1.6–2.4)

## 2017-06-05 NOTE — Progress Notes (Signed)
NEPHROLOGY INPATIENT PROGRESS NOTE    Diana Chambers is a 67 year old female with hx of Group 1 PAH associated with SLE and /Sjogren's disease, presented to the hospital with worsening legs swelling. 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        Events:   UOP 2.8--> -1.9  NAEO  Weight and Is/Os still discordant.       Subjective/ROS  Pt feels better every day. No chest pain or SOB.       Allergies:  Allergies   Allergen Reactions    Allopurinol Hives    Levaquin Swelling and Other     Severe joint pain      Ofloxacin Nausea and Vomiting    Chlorhexidine Itching    Sulfa Drugs Unspecified       Medications:  Medications Prior to Admission   Medication Sig Dispense Refill Last Dose    azelastine (ASTELIN) 0.1 % nasal spray Spray 1 spray into each nostril 2 times daily. Use in each nostril as directed 1 bottle 3 Not Taking    Cholecalciferol 2000 UNITS CAPS Take 1 capsule by mouth daily.   Taking    digoxin (LANOXIN) 0.125 MG tablet Take 1 tablet (125 mcg) by mouth every evening. 30 tablet 3 Taking    Estradiol (DIVIGEL) 0.25 MG/0.25GM GEL Apply 0.25 mg topically nightly.   Taking    estradiol (ESTRING) 2 MG vaginal ring Insert 1 Ring vaginally Every 3 months. Follow package directions   Taking    ferrous sulfate 324 (65 FE) MG TBEC Take 1 tablet (324 mg) by mouth daily. 30 tablet 0 Taking    FLOLAN 1.5 MG IV SOLR 89FY/BO/FBP   Taking    FOLIC ACID 102 MCG OR TABS 1 TABLET DAILY   Taking    furosemide (LASIX) 40 MG tablet Take 2.5 tablets (100 mg) by mouth 3 times daily. 200 tablet 3 Taking    medroxyPROGESTERone (PROVERA) 2.5 MG tablet Take 1.25 mg by mouth daily.   Taking    potassium chloride (KLOR-CON) 10 MEQ tablet TAKE 4 TABLETS BY MOUTH DAILY. 120 each 0 Taking    [EXPIRED] sildenafil (REVATIO, VIAGRA) 20 MG tablet TAKE FOUR TABLETS BY MOUTH THREE TIMES DAILY 360 each 0 Taking     spironolactone (ALDACTONE) 50 MG tablet TAKE 1 TABLET BY MOUTH DAILY. (Patient taking differently: Take 75 mg by mouth.  ) 30 each 0 Taking    vitamin b-12 (CYANOCOBALAMIN) 500 MCG tablet TAKE 1 TABLET BY MOUTH DAILY. 30 each 3 Taking     Physical Exam:  BP 122/65 (BP Location: Right arm, BP Patient Position: Sitting)    Pulse 93    Temp 98.1 F (36.7 C)    Resp 20    Ht 5\' 2"  (1.575 m)    Wt 66.1 kg (145 lb 11.6 oz)    SpO2 99%    BMI 26.65 kg/m   HEENT: NC/AT  General: NAD, stting in chair,  Eyes: Anicetric sclera  Mouth: MMM  CV: RRR  Pulm: Better air entry, no crackles   Abd:  Soft, non tender,  distended  GU: Foley  Ext: 3 mm edema on both LE. No hip edema. Improving     Labs:  Lab Results   Component Value Date    NA 133 (L) 06/05/2017    K 3.9 06/05/2017    CL 95 (L) 06/05/2017  BICARB 23 06/05/2017    BUN 100 (H) 06/05/2017    CREAT 2.48 (H) 06/05/2017    GLU 101 (H) 06/05/2017    [place] 8.1 (L) 06/05/2017     Lab Results   Component Value Date    WBC 3.3 (L) 06/05/2017    HGB 8.5 (L) 06/05/2017    HCT 27.7 (L) 06/05/2017    PLT 66 (L) 06/05/2017       Assessment and Recommendations:    # AKI on CKD in the setting of PAH/rt sided HF, responding well to diuretics with Net negative every day   -Would do fluid restriction to 1.5 or 2 lit a day to track fluid intake better.   -UA is bland, no evidence of any active inflammatory process in kidneys and UA has been same for years despite using steroids intermittently with improvement in kidney function and UOP.   -UPCR ~ 0.3 which is better than before  -We do not think there is any nephritis or CTD involvement in her kidney given no evidence of inflammation on urine sediment.    -Continue to diurese and kidney function is expected to fluctuate with current hemodynamics. Cr is stable today.   # Lytes: acceptable. Getting K replacement.   # Volume: Hypervolemic, diuresis per primary team to keep net negative 1-2 lit. On lasix drip   # Acid-Base: acceptable.   #  MBD: phos slightly elevated, continue renal diet and monitor for now.      The above plan was discussed with the attending, Dr. Filomena Jungling    Florene Glen, MD  Nephrology Fellow, PGY 4  Division of Nephrology-Hypertension   Pager: [phone]

## 2017-06-05 NOTE — Progress Notes (Signed)
NEPHROLOGY INPATIENT PROGRESS NOTE    Diana Chambers is a 67 year old female with hx of Group 1 PAH associated with SLE and /Sjogren's disease, presented to the hospital with worsening legs swelling. 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    Events:   Yday- NAE    Subjective/ROS  No chest pain or SOB. Feels swelling is ongoing but better. Urinated a lot overnight. Reports her dry weight when feeling well was closer to 120 lbs.    Physical Exam:  General: NAD, stting in chair,  Head: NC/AT  Eyes: Anicetric sclera  Mouth: MMM  CV: RRR  Pulm: Better air entry, bilateral crackles at bases  Abd:  Soft, non tender,  distended  GU: Foley  Ext: 3 mm edema on both LE. Improving   _____________________________________________________________________________  Most Recent Vital Signs:   06/05/17  1600 06/05/17  1932 06/05/17  2336 06/06/17  0347   BP: 104/74 110/58 102/54 124/69   Pulse: 93 90 91 99   Resp: _0 Temp: 98 F (36.7 C) 98.6 F (37 C) 98.5 F (36.9 C) 98.7 F (37.1 C)   SpO2: 99% 99% 96% 98%     24hour Vital Signs:  Temperature:  [97.9 F (36.6 C)-98.7 F (37.1 C)] 98.7 F (37.1 C) (05/25 0347)  Blood pressure (BP): (102-124)/(54-74) 124/69 (05/25 0347)  Heart Rate:  [89-99] 99 (05/25 0347)  Respirations:  [18-20] 18 (05/25 0347)  Pain Score: 0 (05/25 0347)  O2 Device: Nasal cannula (05/24 1932)  O2 Flow Rate (L/min):  [3 l/min] 3 l/min (05/24 1932)  SpO2:  [96 %-99 %] 98 % (05/25 0347)    Intake/Output:  05/24 0600 - 05/25 0559  In: 2236 [P.O.:1960; I.V.:276]  Out: 2800 [Urine:2800]  Net: -564  06/05/2017 0600 - 06/06/2017 0600  Urine x3  Stool x2  Emesis x0    Wt Readings from Last 3 Encounters:   06/06/17 66.2 kg (145 lb 14.4 oz)   01/21/17 58.9 kg (129 lb 14.4 oz)   10/15/16 67.1 kg (148 lb)     Diet Therapeutic; Renal Restricted; Renal 60g Protein/ 2g Na/ 2g K+/ 1g  Phos  _____________________________________________________________________________    Labs reviewed; significant for:  Recent Labs     06/04/17  0620  06/05/17  0608 06/05/17  1740 06/06/17  0553   NA 133*   < > 133* 132* 134*   K 3.6   < > 3.9 4.2 3.6   CL 95*   < > 95* 95* 95*   BICARB 22   < > _1 BUN 92*   < > 100* 102* 105*   CREAT 2.24*   < > 2.48* 2.41* 2.45*   GFRNON 22   < > _2 GLU 82   < > 101* 101* 91   ANION 16*   < > 15 15 17*   [place] 8.3*   < > 8.1* 7.8* 8.2*   MG 2.0   < > 2.2 2.1 2.0   PHOS 5.3*  --  5.0*  --  5.8*    < > = values in this interval not displayed.   No results for input(s): TP, ALB, ALT, AST, TBILI, ALK in the last 72 hours.    Invalid input(s):  DBILI  Recent Labs     06/05/17  0608   WBC  3.3*   HGB 8.5*   HCT 27.7*   PLT 66*     _____________________________________________________________________________  Microbiology  Lab Results   Component Value Date    BCRES No Growth 05/16/2017     Lab Results   Component Value Date    URINECULTURE (A) 06/11/2016     Staphylococcus aureus  >100,000 colonies/mL  Two colony types; same susceptibility pattern  Identification performed by [employer] [facility]( [facility]). This test  was developed and its performance characteristics determined by [facility]. It has not been cleared or  approved by the U.S. Food and Drug Administration.  The FDA has  determined that such clearance or approval is not necessary.       Lab Results   Component Value Date    GSRES  02/22/2013     Rare Gram positive cocci  Rare yeast  (Performed at [facility]       Lab Results   Component Value Date    CXRES Moderate Normal Respiratory Flora (A) 06/15/2011    CXRES Moraxella (Branhamella) catarrhalis  Heavy 06/15/2011    CXRES Streptococcus group C  Rare 06/15/2011     _____________________________________________________________________________  Medications reviewed   chlorothiazide  500 mg Q12H    medroxyPROGESTERone  1.25 mg  Q24H NR    nystatin  5 mL 4x Daily    potassium chloride  40 mEq BID    Prostacyclin Cassette Change   Daily    Prostacyclin Tubing   Once per day on Mon Wed Fri    Prostacylin Batteries   Once per day on Mon    sildenafil  80 mg TID    sodium chloride (PF)  3 mL Q8H    spironolactone  75 mg Daily      DOPamine 2 mcg/kg/min (06/05/17 1944)    epoprostenol (FLOLAN) infusion 35 ng/kg/min (06/05/17 1944)    furosemide (LASIX) infusion 20 mg/hr (06/05/17 1945)    sodium chloride        acetaminophen  650 mg Q6H PRN    loperamide  2 mg TID PRN    nalOXone  0.1 mg Q2 Min PRN    sodium chloride (PF)  3 mL PRN    sodium chloride   Continuous PRN     _____________________________________________________________________________  Imaging reviewed  X-Ray Chest Single View  Narrative: EXAM DESCRIPTION:  X-RAY CHEST SINGLE VIEW    CLINICAL HISTORY:  Pulmonary hypertension    COMPARISON:  08/11/2016    TECHNIQUE:  Frontal chest radiograph.    FINDINGS:  Lungs are well expanded. Bilateral perihilar vascular prominence which may be related to sequela of pulmonary hypertension. Possible component of mild interstitial pulmonary edema. Mild blunting the right lateral costophrenic sulcus. No pneumothorax. Normal trachea. Enlargement of the central pulmonary arteries. Increased size of the cardiac silhouette from prior. Aortic calcifications. Normal mediastinal contour. No acute osseous abnormality. Central venous catheter with tip in the proximal right atrium.             Signed by: Rolly Salter 05/16/2017 11:17:07  Impression: IMPRESSION:  Enlargement of the cardiac silhouette from the prior chest radiograph which may related to cardiac chamber enlargement/increased intravascular volume but pericardial effusion cannot be excluded.    Sequela of portal hypertension. Query mild interstitial pulmonary edema and small right pleural  effusion.    _____________________________________________________________________________  Allergies  Allergies   Allergen Reactions    Allopurinol Hives    Levaquin Swelling and Other     Severe joint  pain      Ofloxacin Nausea and Vomiting    Chlorhexidine Itching    Sulfa Drugs Unspecified   _____________________________________________________________________________    Assessment and Recommendations:    # AKI on CKD in the setting of PAH/rt sided HF, responding well to diuretics with Net negative every day   -sCr trend stable.  -Recommend fluid restriction to 1.5 or 2 lit a day to track fluid intake better.   -UA is bland, no evidence of any active inflammatory process in kidneys and UA has been same for years despite using steroids intermittently with improvement in kidney function and UOP.   -UPCR ~ 0.3 which is better than before  -We do not think there is any nephritis or CTD involvement in her kidney given no evidence of inflammation on urine sediment.   -Continue to diurese and kidney function is expected to fluctuate with current hemodynamics.    # Lytes: acceptable, likely improving hypervolemic hyponatremia.  # Volume: Hypervolemic, diuresis per primary team to keep net negative 1-2 lit. On lasix drip   # Acid-Base: acceptable.   # MBD: phos elevated, continue renal diet, continue to monitor/ no need for phosphate binder at this time.     The above plan was discussed with the attending, Dr. Filomena Jungling    Bonnita Levan, MD  Nephrology Fellow, PGY 4  Division of Nephrology-Hypertension   [place] [facility] [place]  [address] [place], [place] [address]  Pager: [phone]

## 2017-06-05 NOTE — Progress Notes (Signed)
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 failure on 3Lpm now here with worsening LEx edema, AKI, now on dopamine and lasix gtt    Review of Systems:   All other systems were reviewed and are negative except as noted above    Current Medications:   chlorothiazide  500 mg Q12H    medroxyPROGESTERone  1.25 mg Q24H NR    nystatin  5 mL 4x Daily    potassium chloride  40 mEq BID    Prostacyclin Cassette Change   Daily    Prostacyclin Tubing   Once per day on Mon Wed Fri    Prostacylin Batteries   Once per day on Mon    sildenafil  80 mg TID    sodium chloride (PF)  3 mL Q8H    spironolactone  75 mg Daily      DOPamine 2 mcg/kg/min (06/04/17 2354)    epoprostenol (FLOLAN) infusion 35 ng/kg/min (06/04/17 1900)    furosemide (LASIX) infusion 20 mg/hr (06/05/17 0144)    sodium chloride        acetaminophen  650 mg Q6H PRN    loperamide  2 mg TID PRN    nalOXone  0.1 mg Q2 Min PRN    sodium chloride (PF)  3 mL PRN    sodium chloride   Continuous PRN       Vital Signs:  Temperature:  [97.2 F (36.2 C)-98.7 F (37.1 C)] 97.9 F (36.6 C) (05/24 1143)  Blood pressure (BP): (103-126)/(56-75) 118/64 (05/24 1143)  Heart Rate:  [89-99] 89 (05/24 1215)  Respirations:  [18-21] 20 (05/24 1215)  Pain Score: 0 (05/24 1143)  O2 Device: Nasal cannula (05/24 1214)  O2 Flow Rate (L/min):  [3 l/min] 3 l/min (05/24 1214)  SpO2:  [96 %-99 %] 99 % (05/24 1215)  Wt Readings from Last 1 Encounters:   06/05/17 66.1 kg (145 lb 11.6 oz)     Intake/Output (Current Shift):  05/23 0600 - 05/24 0559  In: 643 [P.O.:320; I.V.:617]  Out: 2850 [Urine:2850]  Stool x4 today    Physical Exam:  Gen: A&O, conversing comfortably  Eyes: EOMI, pupils equal, anicteric  HENT: Atraumatic, normocephalic  Neck: + JVD, trachea midline  CV: RRR, +murmur, accentuated S2, No R/G/S3. L tunneled IJ CVC site  is CDI  Resp: stable bibasilar crackles, no intercostal retractions/normal effort  Abdo: NT mild D, soft, no masses.  Ext: +2 BLEx edema  Neuro: No focal weakness or sensory deficit, CN grossly intact  Psych: Appropriate affect and mood, good insight. AnOx3  Skin: Normal temperature, no rash or nodules. Diffuse erythema, likely related to IV Epo; + brusing.     LABS:  Lab Results   Component Value Date    NA 133 (L) 06/05/2017    K 3.9 06/05/2017    CL 95 (L) 06/05/2017    BICARB 23 06/05/2017    BUN 100 (H) 06/05/2017    CREAT 2.48 (H) 06/05/2017    GLU 101 (H) 06/05/2017    [place] 8.1 (L) 06/05/2017     ASSESSMENT AND PLAN  67 yo F with WHO I PAH associated with SLE and prior anorexogen use admitted with acute on chronic right heart failure and volume overload as well as AKI and thrombocytopenia.Persistent edema.  Minimal  progress on exam and weight despite negative 20 L by I/O.    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

## 2017-06-05 NOTE — Interdisciplinary (Signed)
06/05/17 0955   Follow Up/Progress   Is the Patient Ready for Discharge No   Supplies/Services  Home health   Barriers to Discharge Awaiting clinical improvement   Patient/Family/Legal/Surrogate Decision Maker Has Been Given Options And Choice In The Selection of Post-Acute Care Providers Yes   Family/Caregiver's Assessed for Readiness, willingness, and ability to provide or support self-management activities   Respite Care Not Applicable   Patient/Family Are In Agreement With Discharge Plan Yes   Public Health Clearance Needed Not Applicable   Plan/Interventions Explore needs and options for aftercare, provide referrals;Ongoing weekly therapeutic interventions     Medical necessity Reason for continued Hospital Stay:  acute on chronic right heart failure and volume overload as well as AKI and thrombocytopenia.Persistent edema.  ?  Interventions requiring continued Hospitalization/Plan of Care:  - Cont lasix IVP diuresis  - Cont dopamine gtt  - Continuous supplemental O2 @ 3L per NC  - Cont IVP diuril  - Cont flolan gtt  ?  Anticipated discharge plan at this time (home, SNF, etc.):  Home w/ HH  ?  Barriers to Discharge:  None.  ?  Expected DC Date:  TBD.    Pt is currently on service with [employer], for Flolan gtt. Pt on service with [employer], for Oxygen DME. CM to confirm service resumption with both companies, upon discharge. CM to follow clinical course and assess for any other DC needs.  Pt's home is in [place].  Pt to request [facility] referral from PCP upon return home.

## 2017-06-05 NOTE — Plan of Care (Signed)
Problem: Promotion of Health and Safety  Goal: Promotion of Health and Safety  The patient remains safe, receives appropriate treatment and achieves optimal outcomes (physically, psychosocially, and spiritually) within the limitations of the disease process by discharge.    Information below is the current care plan.  Outcome: Progressing   06/04/17 2148 06/05/17 0259   Patient /Family stated Goal   Patient /Family stated Goal walk two laps  --    Adult/Peds Plan of Care   Guidelines --  Inpatient Nursing Guidelines   Individualized Interventions/Recommendations #1 --  Encourage pt to ambulate and increase mobility while awake.    Individualized Interventions/Recommendations #2 (if applicable) --  Communicate POC and encourage pt to call before ambulating to the bathroom    Individualized Interventions/Recommendations #3 (if applicable) --  Encourage participation in care by pt and husband   Individualized Interventions/Recommendations #4 (if applicable) --  Cluster care to promote rest    Outcome Evaluation (rationale for progressing/not progressing) every shift --  Pt ambulating in room with 1 person assist. Steady gait. Calls appropriately before getting up. Husband assisting patient wtih ADLs. Pt participating in care. Resting comfortably in bed.

## 2017-06-05 NOTE — Plan of Care (Signed)
Problem: Promotion of Health and Safety  Goal: Promotion of Health and Safety  The patient remains safe, receives appropriate treatment and achieves optimal outcomes (physically, psychosocially, and spiritually) within the limitations of the disease process by discharge.    Information below is the current care plan.  Outcome: Progressing   06/05/17 0259 06/05/17 0745 06/05/17 1129   Patient /Family stated Goal   Patient /Family stated Goal --  My swelling to go down --    Adult/Peds Plan of Care   Guidelines Inpatient Nursing Guidelines --  --    Individualized Interventions/Recommendations #1 --  --  Encourage pt to elevate legs when in bed/sitting and wrap legs with acewraps to reduce swelling   Individualized Interventions/Recommendations #2 (if applicable) --  --  Administer diuretics as ordered and monitor for pt's tolerance    Individualized Interventions/Recommendations #3 (if applicable) --  --  Monitor Strict I/0's and daily weights   Individualized Interventions/Recommendations #4 (if applicable) --  --  Encourage pt to be OOB to chair and ambulate during daytime as tolerated   Outcome Evaluation (rationale for progressing/not progressing) every shift --  --  Pt continues to have swelling BLE +3 and ascites on current regimin. Pt up to chair/ambulating in room most of shift and tolerating well. Pt voiding fair amounts of UOP. Safety maintained this shift.

## 2017-06-06 LAB — MAGNESIUM, BLOOD
Magnesium: 2 mg/dL (ref 1.6–2.4)
Magnesium: 2 mg/dL (ref 1.6–2.4)

## 2017-06-06 LAB — BASIC METABOLIC PANEL, BLOOD
Anion Gap: 17 mmol/L — ABNORMAL HIGH (ref 7–15)
Anion Gap: 18 mmol/L — ABNORMAL HIGH (ref 7–15)
BUN: 105 mg/dL — ABNORMAL HIGH (ref 8–23)
BUN: 106 mg/dL — ABNORMAL HIGH (ref 8–23)
Bicarbonate: 22 mmol/L (ref 22–29)
Bicarbonate: 18 mmol/L — ABNORMAL LOW (ref 22–29)
Calcium: 8.2 mg/dL — ABNORMAL LOW (ref 8.5–10.6)
Calcium: 8.4 mg/dL — ABNORMAL LOW (ref 8.5–10.6)
Chloride: 95 mmol/L — ABNORMAL LOW (ref 98–107)
Chloride: 95 mmol/L — ABNORMAL LOW (ref 98–107)
Creatinine: 2.45 mg/dL — ABNORMAL HIGH (ref 0.51–0.95)
Creatinine: 2.23 mg/dL — ABNORMAL HIGH (ref 0.51–0.95)
GFR: 20 mL/min
GFR: 22 mL/min
Glucose: 91 mg/dL (ref 70–99)
Glucose: 186 mg/dL — ABNORMAL HIGH (ref 70–99)
Potassium: 3.6 mmol/L (ref 3.5–5.1)
Potassium: 4.2 mmol/L (ref 3.5–5.1)
Sodium: 134 mmol/L — ABNORMAL LOW (ref 136–145)
Sodium: 131 mmol/L — ABNORMAL LOW (ref 136–145)

## 2017-06-06 LAB — PHOSPHORUS, BLOOD: Phosphorous: 5.8 mg/dL — ABNORMAL HIGH (ref 2.7–4.5)

## 2017-06-06 LAB — ALBUMIN, BLOOD: Albumin: 3.4 g/dL — ABNORMAL LOW (ref 3.5–5.2)

## 2017-06-06 MED ORDER — FUROSEMIDE 10 MG/ML IJ SOLN
100.00 mg | Freq: Once | INTRAMUSCULAR | Status: AC
Start: 2017-06-06 — End: 2017-06-06
  Administered 2017-06-06 (×2): 100 mg via INTRAVENOUS
  Filled 2017-06-06: qty 10

## 2017-06-06 MED ORDER — METHYLPREDNISOLONE SODIUM SUCC 40 MG IJ SOLR
30.00 mg | Freq: Every day | INTRAMUSCULAR | Status: AC
Start: 2017-06-06 — End: 2017-06-08
  Administered 2017-06-06 – 2017-06-08 (×3): 30 mg via INTRAVENOUS
  Filled 2017-06-06 (×3): qty 40

## 2017-06-06 NOTE — Plan of Care (Signed)
Problem: Promotion of Health and Safety  Goal: Promotion of Health and Safety  The patient remains safe, receives appropriate treatment and achieves optimal outcomes (physically, psychosocially, and spiritually) within the limitations of the disease process by discharge.    Information below is the current care plan.  Outcome: Progressing   06/06/17 1013   Adult/Peds Plan of Care   Guidelines Inpatient Nursing Guidelines   Individualized Interventions/Recommendations #1 Monitor patient's vital signs   Individualized Interventions/Recommendations #2 (if applicable) Monitor patient's edema - patient's legs are wrapped in ACE wraps   Individualized Interventions/Recommendations #3 (if applicable) Monitor patient's fluid volume status - pt getting IV lasix + diuretics   Individualized Interventions/Recommendations #4 (if applicable) Strict I&Os as patient is on diuretics

## 2017-06-06 NOTE — Plan of Care (Signed)
Problem: Promotion of Health and Safety  Goal: Promotion of Health and Safety  The patient remains safe, receives appropriate treatment and achieves optimal outcomes (physically, psychosocially, and spiritually) within the limitations of the disease process by discharge.    Information below is the current care plan.  Outcome: Progressing   06/02/17 0529 06/05/17 0259 06/05/17 1129   Patient /Family stated Goal   Patient /Family stated Goal --  --  --    Adult/Peds Plan of Care   Guidelines --  Inpatient Nursing Guidelines --    Individualized Interventions/Recommendations #1 --  --  Encourage pt to elevate legs when in bed/sitting and wrap legs with acewraps to reduce swelling   Individualized Interventions/Recommendations #2 (if applicable) --  --  Administer diuretics as ordered and monitor for pt's tolerance    Individualized Interventions/Recommendations #3 (if applicable) --  --  Monitor Strict I/0's and daily weights   Individualized Interventions/Recommendations #4 (if applicable) --  --  Encourage pt to be OOB to chair and ambulate during daytime as tolerated   Individualized Interventions/Recommendations #5 (if applicable) monitor renal function --  --    Outcome Evaluation (rationale for progressing/not progressing) every shift --  --  Pt continues to have swelling BLE +3 and ascites on current regimin. Pt up to chair/ambulating in room most of shift and tolerating well. Pt voiding fair amounts of UOP. Safety maintained this shift.    06/05/17 1932   Patient /Family stated Goal   Patient /Family stated Goal Sleep/good urine OP   Adult/Peds Plan of Care   Guidelines --    Individualized Interventions/Recommendations #1 --    Individualized Interventions/Recommendations #2 (if applicable) --    Individualized Interventions/Recommendations #3 (if applicable) --    Individualized Interventions/Recommendations #4 (if applicable) --    Individualized Interventions/Recommendations #5 (if applicable) --       Outcome Evaluation (rationale for progressing/not progressing) every shift --

## 2017-06-06 NOTE — Progress Notes (Addendum)
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 failure on 3Lpm now here with worsening LEx edema, AKI, now on dopamine and lasix gtt    Review of Systems:   All other systems were reviewed and are negative except as noted above    Current Medications:   chlorothiazide  500 mg Q12H    medroxyPROGESTERone  1.25 mg Q24H NR    nystatin  5 mL 4x Daily    potassium chloride  40 mEq BID    Prostacyclin Cassette Change   Daily    Prostacyclin Tubing   Once per day on Mon Wed Fri    Prostacylin Batteries   Once per day on Mon    sildenafil  80 mg TID    sodium chloride (PF)  3 mL Q8H    spironolactone  75 mg Daily      DOPamine 2 mcg/kg/min (06/06/17 0900)    epoprostenol (FLOLAN) infusion 35 ng/kg/min (06/06/17 0900)    furosemide (LASIX) infusion 20 mg/hr (06/06/17 0822)    sodium chloride        acetaminophen  650 mg Q6H PRN    loperamide  2 mg TID PRN    nalOXone  0.1 mg Q2 Min PRN    sodium chloride (PF)  3 mL PRN    sodium chloride   Continuous PRN       Vital Signs:  Temperature:  [97.9 F (36.6 C)-98.7 F (37.1 C)] 98.6 F (37 C) (05/25 0740)  Blood pressure (BP): (102-124)/(54-74) 117/67 (05/25 0740)  Heart Rate:  [89-99] 95 (05/25 0740)  Respirations:  [18-20] 18 (05/25 0740)  Pain Score: 0 (05/25 0740)  O2 Device: Nasal cannula (05/25 0740)  O2 Flow Rate (L/min):  [3 l/min] 3 l/min (05/25 0740)  SpO2:  [96 %-99 %] 97 % (05/25 0740)  Wt Readings from Last 1 Encounters:   06/06/17 66.2 kg (145 lb 14.4 oz)     Intake/Output (Current Shift):  05/24 0600 - 05/25 0559  In: 2236 [P.O.:1960; I.V.:276]  Out: 2800 [Urine:2800]  Stool x4 today    Physical Exam:  Gen: A&O, conversing comfortably  Eyes: EOMI, pupils equal, anicteric  HENT: Atraumatic, normocephalic  Neck: + JVD, trachea midline  CV: RRR, +murmur, accentuated S2, No R/G/S3. L tunneled IJ CVC site  is CDI  Resp: stable bibasilar crackles, no intercostal retractions/normal effort  Abdo: NT mild D, soft, no masses.  Ext: +2 BLEx edema  Neuro: No focal weakness or sensory deficit, CN grossly intact  Psych: Appropriate affect and mood, good insight. AnOx3  Skin: Normal temperature, no rash or nodules. Diffuse erythema, likely related to IV Epo; + brusing.     LABS:  Lab Results   Component Value Date    NA 134 (L) 06/06/2017    K 3.6 06/06/2017    CL 95 (L) 06/06/2017    BICARB 22 06/06/2017    BUN 105 (H) 06/06/2017    CREAT 2.45 (H) 06/06/2017    GLU 91 06/06/2017    [place] 8.2 (L) 06/06/2017     ASSESSMENT AND PLAN  67 yo F with WHO I PAH associated with SLE and prior anorexogen use admitted with acute on chronic right heart failure and volume overload as well as AKI and thrombocytopenia.Persistent edema.  Minimal progress  on exam and weight despite negative 20 L by I/O.    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  Additional 100 mg IV x1  - Diuril 500 mg IV BID  - Continue Spironolactone  - Increase dopamine to 3 mcg/kg/min to help with diuresis  -Trial of solumedrol 30 mg IV daily x 3 doses to determine if improves urin output/renal fxn  - Continue home sildenafil and flolan at 35 ng/kg/min  - Continue oxygen suppl  - Fluid restrict to 1.5 L/day      [name] MD  Pager [phone]

## 2017-06-06 NOTE — Progress Notes (Signed)
NEPHROLOGY INPATIENT PROGRESS NOTE    Diana Chambers is a 67 year old female with hx of Group 1 PAH associated with SLE and /Sjogren's disease, presented to the hospital with worsening legs swelling. 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    Events:   Yday- PCCM- Additional lasix 100mg  IV x1, increased dopamine 66mcg/kg/min, solumedrol 30mg  IV daily x3 dose, fluid restriction 1.5L/day.    Subjective/ROS  No chest pain or SOB. Feels swelling is improving. No questions for nephrology today.    Physical Exam:  General: NAD, stting in chair,  Head: NC/AT  Eyes: Anicetric sclera  Mouth: MMM  CV: RRR  Pulm: Better air entry, bilateral crackles at bases  Abd:  Soft, non tender,  distended  GU: Foley  Ext: 3 mm edema on both LE. Improving   _____________________________________________________________________________  Most Recent Vital Signs:   06/06/17  1514 06/06/17  1945 06/07/17  0010 06/07/17  0522   BP: 114/68 107/62 104/60 118/64   Pulse:  101 96 96   Resp: 19 20 22 20    Temp: 98.2 F (36.8 C) 98.7 F (37.1 C) 97.7 F (36.5 C) 98.3 F (36.8 C)   SpO2: 97% 96% 98% 94%     24hour Vital Signs:  Temperature:  [97.7 F (36.5 C)-98.7 F (37.1 C)] 98.3 F (36.8 C) (05/26 0522)  Blood pressure (BP): (104-119)/(60-75) 118/64 (05/26 0522)  Heart Rate:  [95-101] 96 (05/26 0522)  Respirations:  [19-22] 20 (05/26 0522)  Pain Score: 0 (05/26 0522)  O2 Device: Nasal cannula (05/26 0522)  O2 Flow Rate (L/min):  [3 l/min] 3 l/min (05/26 0522)  SpO2:  [94 %-98 %] 94 % (05/26 0522)    Intake/Output:  05/25 0600 - 05/26 0559  In: 2053.2 [P.O.:1010; I.V.:1043.2]  Out: 3500 [Urine:3500]  Net: -1446.8  06/06/2017 0600 - 06/07/2017 0600  Urine x0  Stool x0  Emesis x0    Wt Readings from Last 3 Encounters:   06/07/17 65.6 kg (144 lb 10 oz)   01/21/17 58.9 kg (129 lb 14.4 oz)   10/15/16 67.1 kg (148 lb)     Diet Regular; 1584ml  Volume Restriction  _____________________________________________________________________________    Labs reviewed; significant for:  Recent Labs     06/05/17  0608  06/06/17  0553 06/06/17  1620 06/07/17  0547   NA 133*   < > 134* 131* 134*   K 3.9   < > 3.6 4.2 4.2   CL 95*   < > 95* 95* 94*   BICARB 23   < > 22 18* 24   BUN 100*   < > 105* 106* 108*   CREAT 2.48*   < > 2.45* 2.23* 2.40*   GFRNON 19   < > 20 22 20    GLU 101*   < > 91 186* 155*   ANION 15   < > 17* 18* 16*   [place] 8.1*   < > 8.2* 8.4* 9.0   MG 2.2   < > 2.0 2.0 2.1   PHOS 5.0*  --  5.8*  --  6.0*    < > = values in this interval not displayed.     Recent Labs     06/06/17  1620 06/07/17  0547   ALB 3.4* 3.4*     Recent Labs     06/05/17  0608   WBC 3.3*   HGB  8.5*   HCT 27.7*   PLT 66*     _____________________________________________________________________________  Microbiology  Lab Results   Component Value Date    BCRES No Growth 05/16/2017     Lab Results   Component Value Date    URINECULTURE (A) 06/11/2016     Staphylococcus aureus  >100,000 colonies/mL  Two colony types; same susceptibility pattern  Identification performed by [employer] [facility]( [facility]To[facility]). This test  was developed and its performance characteristics determined by [facility]. It has not been cleared or  approved by the U.S. Food and Drug Administration.  The FDA has  determined that such clearance or approval is not necessary.       Lab Results   Component Value Date    GSRES  02/22/2013     Rare Gram positive cocci  Rare yeast  (Performed at [facility]       Lab Results   Component Value Date    CXRES Moderate Normal Respiratory Flora (A) 06/15/2011    CXRES Moraxella (Branhamella) catarrhalis  Heavy 06/15/2011    CXRES Streptococcus group C  Rare 06/15/2011     _____________________________________________________________________________  Medications reviewed   chlorothiazide  500 mg Q12H    medroxyPROGESTERone  1.25 mg Q24H NR     methylPREDNISolone sodium succinate  30 mg Daily    nystatin  5 mL 4x Daily    potassium chloride  40 mEq BID    Prostacyclin Cassette Change   Daily    Prostacyclin Tubing   Once per day on Mon Wed Fri    Prostacylin Batteries   Once per day on Mon    sildenafil  80 mg TID    sodium chloride (PF)  3 mL Q8H    spironolactone  75 mg Daily      DOPamine 3 mcg/kg/min (06/07/17 0524)    epoprostenol (FLOLAN) infusion 35 ng/kg/min (06/07/17 0524)    furosemide (LASIX) infusion 20 mg/hr (06/07/17 0708)    sodium chloride        acetaminophen  650 mg Q6H PRN    loperamide  2 mg TID PRN    nalOXone  0.1 mg Q2 Min PRN    sodium chloride (PF)  3 mL PRN    sodium chloride   Continuous PRN     _____________________________________________________________________________  Imaging reviewed  X-Ray Chest Single View  Narrative: EXAM DESCRIPTION:  X-RAY CHEST SINGLE VIEW    CLINICAL HISTORY:  Pulmonary hypertension    COMPARISON:  08/11/2016    TECHNIQUE:  Frontal chest radiograph.    FINDINGS:  Lungs are well expanded. Bilateral perihilar vascular prominence which may be related to sequela of pulmonary hypertension. Possible component of mild interstitial pulmonary edema. Mild blunting the right lateral costophrenic sulcus. No pneumothorax. Normal trachea. Enlargement of the central pulmonary arteries. Increased size of the cardiac silhouette from prior. Aortic calcifications. Normal mediastinal contour. No acute osseous abnormality. Central venous catheter with tip in the proximal right atrium.             Signed by: Rolly Salter 05/16/2017 11:17:07  Impression: IMPRESSION:  Enlargement of the cardiac silhouette from the prior chest radiograph which may related to cardiac chamber enlargement/increased intravascular volume but pericardial effusion cannot be excluded.    Sequela of portal hypertension. Query mild interstitial pulmonary edema and small right pleural  effusion.    _____________________________________________________________________________  Allergies  Allergies   Allergen Reactions    Allopurinol Hives    Levaquin Swelling and Other  Severe joint pain      Ofloxacin Nausea and Vomiting    Chlorhexidine Itching    Sulfa Drugs Unspecified   _____________________________________________________________________________    Assessment and Recommendations:    # AKI on CKD in the setting of PAH/rt sided HF, responding well to diuretics with Net negative every day   -sCr trend stable.  -Recommend fluid restriction to 1.5 or 2 lit a day to track fluid intake better.   -UA is bland, no evidence of any active inflammatory process in kidneys and UA has been same for years despite using steroids intermittently with improvement in kidney function and UOP.   -UPCR ~ 0.3 which is better than before  -We do not think there is any nephritis or CTD involvement in her kidney given no evidence of inflammation on urine sediment.   -Continue to diurese and kidney function is expected to fluctuate with current hemodynamics.    # Lytes: acceptable, likely improving hypervolemic hyponatremia.  # Volume: Hypervolemic, diuresis per primary team to keep net negative 1-2 lit. On lasix drip + additional lasix PRN per primary team.  # Acid-Base: acceptable.   # MBD: phos elevated, continue renal diet, continue to monitor/ no need for phosphate binder at this time.     The above plan was discussed with the attending, Dr. Filomena Jungling    Bonnita Levan, MD  Nephrology Fellow, PGY 4  Division of Nephrology-Hypertension   [place] [facility] [place] [place] [facility]  [address] [place], [place] [address]  Pager: [phone]

## 2017-06-07 LAB — BASIC METABOLIC PANEL, BLOOD
Anion Gap: 16 mmol/L — ABNORMAL HIGH (ref 7–15)
Anion Gap: 17 mmol/L — ABNORMAL HIGH (ref 7–15)
BUN: 108 mg/dL — ABNORMAL HIGH (ref 8–23)
BUN: 109 mg/dL — ABNORMAL HIGH (ref 8–23)
Bicarbonate: 24 mmol/L (ref 22–29)
Bicarbonate: 23 mmol/L (ref 22–29)
Calcium: 9 mg/dL (ref 8.5–10.6)
Calcium: 8.9 mg/dL (ref 8.5–10.6)
Chloride: 94 mmol/L — ABNORMAL LOW (ref 98–107)
Chloride: 95 mmol/L — ABNORMAL LOW (ref 98–107)
Creatinine: 2.4 mg/dL — ABNORMAL HIGH (ref 0.51–0.95)
Creatinine: 2.43 mg/dL — ABNORMAL HIGH (ref 0.51–0.95)
GFR: 20 mL/min
GFR: 20 mL/min
Glucose: 155 mg/dL — ABNORMAL HIGH (ref 70–99)
Glucose: 145 mg/dL — ABNORMAL HIGH (ref 70–99)
Potassium: 4.2 mmol/L (ref 3.5–5.1)
Potassium: 4.5 mmol/L (ref 3.5–5.1)
Sodium: 134 mmol/L — ABNORMAL LOW (ref 136–145)
Sodium: 135 mmol/L — ABNORMAL LOW (ref 136–145)

## 2017-06-07 LAB — MAGNESIUM, BLOOD
Magnesium: 2.1 mg/dL (ref 1.6–2.4)
Magnesium: 2.1 mg/dL (ref 1.6–2.4)

## 2017-06-07 LAB — ALBUMIN, BLOOD: Albumin: 3.4 g/dL — ABNORMAL LOW (ref 3.5–5.2)

## 2017-06-07 LAB — PREALBUMIN (TRANSTHYRETIN), BLOOD: Prealbumin: 26 mg/dL (ref 20–40)

## 2017-06-07 LAB — PHOSPHORUS, BLOOD: Phosphorous: 6 mg/dL — ABNORMAL HIGH (ref 2.7–4.5)

## 2017-06-07 MED ORDER — FUROSEMIDE 10 MG/ML IJ SOLN
100.00 mg | Freq: Four times a day (QID) | INTRAMUSCULAR | Status: DC
Start: 2017-06-07 — End: 2017-06-07

## 2017-06-07 MED ORDER — FUROSEMIDE 10 MG/ML IJ SOLN
100.00 mg | Freq: Four times a day (QID) | INTRAMUSCULAR | Status: DC
Start: 2017-06-07 — End: 2017-07-09
  Administered 2017-06-07 – 2017-07-09 (×129): 100 mg via INTRAVENOUS
  Filled 2017-06-07 (×130): qty 10

## 2017-06-07 NOTE — Progress Notes (Signed)
NEPHROLOGY INPATIENT PROGRESS NOTE    Diana Chambers is a 67 year old female with hx of Group 1 PAH associated with SLE and /Sjogren's disease, presented to the hospital with worsening legs swelling. 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        Events:   Switched from lasix drip to Lasix IV 100 mg QID  Continues on dopamine 3 mcg  Trial of Solumedrol 30 mg IV daily x 3 doses (Last dose today).     Subjective/ROS  Pt feels fine, no chest pain or SOB      Allergies:  Allergies   Allergen Reactions    Allopurinol Hives    Levaquin Swelling and Other     Severe joint pain      Ofloxacin Nausea and Vomiting    Chlorhexidine Itching    Sulfa Drugs Unspecified       Medications:  Medications Prior to Admission   Medication Sig Dispense Refill Last Dose    azelastine (ASTELIN) 0.1 % nasal spray Spray 1 spray into each nostril 2 times daily. Use in each nostril as directed 1 bottle 3 Not Taking    Cholecalciferol 2000 UNITS CAPS Take 1 capsule by mouth daily.   Taking    digoxin (LANOXIN) 0.125 MG tablet Take 1 tablet (125 mcg) by mouth every evening. 30 tablet 3 Taking    Estradiol (DIVIGEL) 0.25 MG/0.25GM GEL Apply 0.25 mg topically nightly.   Taking    estradiol (ESTRING) 2 MG vaginal ring Insert 1 Ring vaginally Every 3 months. Follow package directions   Taking    ferrous sulfate 324 (65 FE) MG TBEC Take 1 tablet (324 mg) by mouth daily. 30 tablet 0 Taking    FLOLAN 1.5 MG IV SOLR 37TK/WI/OXB   Taking    FOLIC ACID 353 MCG OR TABS 1 TABLET DAILY   Taking    furosemide (LASIX) 40 MG tablet Take 2.5 tablets (100 mg) by mouth 3 times daily. 200 tablet 3 Taking    medroxyPROGESTERone (PROVERA) 2.5 MG tablet Take 1.25 mg by mouth daily.   Taking    potassium chloride (KLOR-CON) 10 MEQ tablet TAKE 4 TABLETS BY MOUTH DAILY. 120 each 0 Taking    [EXPIRED] sildenafil (REVATIO, VIAGRA) 20 MG tablet  TAKE FOUR TABLETS BY MOUTH THREE TIMES DAILY 360 each 0 Taking    spironolactone (ALDACTONE) 50 MG tablet TAKE 1 TABLET BY MOUTH DAILY. (Patient taking differently: Take 75 mg by mouth.  ) 30 each 0 Taking    vitamin b-12 (CYANOCOBALAMIN) 500 MCG tablet TAKE 1 TABLET BY MOUTH DAILY. 30 each 3 Taking     Physical Exam:  BP 119/67 (BP Location: Left arm, BP Patient Position: Sitting)    Pulse 106    Temp 97.8 F (36.6 C)    Resp 18    Ht 5\' 2"  (1.575 m)    Wt 65.6 kg (144 lb 11.2 oz)    SpO2 95%    BMI 26.47 kg/m   HEENT: NC/AT  General: NAD, stting in chair,  Eyes: Anicetric sclera  Mouth: MMM  CV: RRR  Pulm: Better air entry, no crackles   Abd:  Soft, non tender,  distended  GU: Foley  Ext: 3 mm edema on both LE. No hip edema. Improving     Labs:  Lab Results   Component Value Date    NA 133 (L) 06/08/2017  K 4.2 06/08/2017    CL 94 (L) 06/08/2017    BICARB 24 06/08/2017    BUN 110 (H) 06/08/2017    CREAT 2.28 (H) 06/08/2017    GLU 130 (H) 06/08/2017    [place] 9.2 06/08/2017     No results found for: WBC, HGB, HCT, PLT, SEG, BAND, LYMPHS, MONOS, EOS, NRBC    Assessment and Recommendations:    # AKI on CKD in the setting of PAH/rt sided HF, responding well to diuretics with Net negative every day but discordant weight and Is/Os.     -UA is bland, no evidence of any active inflammatory process in kidneys and UA has been same for years despite using steroids intermittently with improvement in kidney function and UOP.   -UPCR ~ 0.3 which is better than before  -We do not think there is any nephritis or CTD involvement in her kidney given no evidence of inflammation on urine sediment.   -Would add metolazone 5 mg prior to one of her lasix doses to augment UOP response.     # Lytes: acceptable  # Volume: Hypervolemic, continue diuresis, currently on Lasix 100 mg QID, Diuril 500 BID and Aldactone 75 mg daily.   # Acid-Base: Normal bicarb.  # MBD: phos slightly elevated, continue renal diet and monitor for now.  No need  for binders.     The above plan was discussed with the attending, Dr. Duayne Cal, MD  Nephrology Fellow, PGY 4  Division of Nephrology-Hypertension   Pager: [phone]

## 2017-06-07 NOTE — Progress Notes (Signed)
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 failure on 3Lpm now here with worsening LEx edema, AKI, now on dopamine and lasix gtt    Review of Systems:   All other systems were reviewed and are negative except as noted above    Current Medications:   chlorothiazide  500 mg Q12H    furosemide  100 mg 4x Daily    medroxyPROGESTERone  1.25 mg Q24H NR    methylPREDNISolone sodium succinate  30 mg Daily    nystatin  5 mL 4x Daily    potassium chloride  40 mEq BID    Prostacyclin Cassette Change   Daily    Prostacyclin Tubing   Once per day on Mon Wed Fri    Prostacylin Batteries   Once per day on Mon    sildenafil  80 mg TID    sodium chloride (PF)  3 mL Q8H    spironolactone  75 mg Daily      DOPamine 3 mcg/kg/min (06/07/17 0800)    epoprostenol (FLOLAN) infusion 35 ng/kg/min (06/07/17 0800)    sodium chloride        acetaminophen  650 mg Q6H PRN    loperamide  2 mg TID PRN    nalOXone  0.1 mg Q2 Min PRN    sodium chloride (PF)  3 mL PRN    sodium chloride   Continuous PRN       Vital Signs:  Temperature:  [97.7 F (36.5 C)-98.7 F (37.1 C)] 98.3 F (36.8 C) (05/26 1147)  Blood pressure (BP): (104-127)/(60-75) 127/67 (05/26 1147)  Heart Rate:  [95-101] 95 (05/26 1147)  Respirations:  [19-22] 20 (05/26 1147)  Pain Score: 0 (05/26 1147)  O2 Device: Nasal cannula (05/26 1147)  O2 Flow Rate (L/min):  [3 l/min] 3 l/min (05/26 1147)  SpO2:  [94 %-98 %] 95 % (05/26 1147)  Wt Readings from Last 1 Encounters:   06/07/17 65.6 kg (144 lb 10 oz)     Intake/Output (Current Shift):  05/25 0600 - 05/26 0559  In: 2053.2 [P.O.:1010; I.V.:1043.2]  Out: 3500 [Urine:3500]  Stool x4 today    Physical Exam:  Gen: A&O, conversing comfortably  Eyes: EOMI, pupils equal, anicteric  HENT: Atraumatic, normocephalic  Neck: + JVD, trachea midline  CV: RRR, +murmur, accentuated S2,  No R/G/S3. L tunneled IJ CVC site is CDI  Resp: stable bibasilar crackles, no intercostal retractions/normal effort  Abdo: NT mild D, soft, no masses.  Ext: +2 BLEx edema  Neuro: No focal weakness or sensory deficit, CN grossly intact  Psych: Appropriate affect and mood, good insight. AnOx3  Skin: Normal temperature, no rash or nodules. Diffuse erythema, likely related to IV Epo; + brusing.     LABS:  Lab Results   Component Value Date    NA 134 (L) 06/07/2017    K 4.2 06/07/2017    CL 94 (L) 06/07/2017    BICARB 24 06/07/2017    BUN 108 (H) 06/07/2017    CREAT 2.40 (H) 06/07/2017    GLU 155 (H) 06/07/2017    [place] 9.0 06/07/2017     ASSESSMENT AND PLAN  67 yo F with WHO I PAH associated with SLE and prior anorexogen use admitted with acute on chronic right heart failure and volume overload as  well as AKI and thrombocytopenia.Persistent edema.  Minimal progress on exam and weight despite negative 20 L by I/O.    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  No benefit from gtt and requires 724mL per day in.  - Diuril 500 mg IV BID  - Continue Spironolactone  - Continue dopamine to 3 mcg/kg/min to help with diuresis  -Trial of solumedrol 30 mg IV daily x 3 doses (day 2) to determine if improves urin output/renal fxn  - Continue home sildenafil and flolan at 35 ng/kg/min  - Continue oxygen suppl  - Fluid restrict to 1.5 L/day      [name] MD  Pager [phone]

## 2017-06-07 NOTE — Plan of Care (Signed)
Problem: Promotion of Health and Safety  Goal: Promotion of Health and Safety  The patient remains safe, receives appropriate treatment and achieves optimal outcomes (physically, psychosocially, and spiritually) within the limitations of the disease process by discharge.    Information below is the current care plan.  Outcome: Progressing   06/02/17 0529 06/06/17 1013 06/07/17 1112   Adult/Peds Plan of Care   Guidelines --  --  Inpatient Nursing Guidelines   Individualized Interventions/Recommendations #1 --  Monitor patient's vital signs --    Individualized Interventions/Recommendations #2 (if applicable) --  Monitor patient's edema - patient's legs are wrapped in ACE wraps --    Individualized Interventions/Recommendations #3 (if applicable) --  --  Monitor patient's fluid volume status - pt is currently on IVP lasix QID + other diuretics   Individualized Interventions/Recommendations #4 (if applicable) --  Strict I&Os as patient is on diuretics --    Individualized Interventions/Recommendations #5 (if applicable) monitor renal function --  --

## 2017-06-07 NOTE — Plan of Care (Signed)
Problem: Promotion of Health and Safety  Goal: Promotion of Health and Safety  The patient remains safe, receives appropriate treatment and achieves optimal outcomes (physically, psychosocially, and spiritually) within the limitations of the disease process by discharge.    Information below is the current care plan.  Outcome: Progressing   06/02/17 0529 06/06/17 1013 06/06/17 1945   Patient /Family stated Goal   Patient /Family stated Goal --  --  to get better   Adult/Peds Plan of Care   Guidelines --  Inpatient Nursing Guidelines --    Individualized Interventions/Recommendations #1 --  Monitor patient's vital signs --    Individualized Interventions/Recommendations #2 (if applicable) --  Monitor patient's edema - patient's legs are wrapped in ACE wraps --    Individualized Interventions/Recommendations #3 (if applicable) --  Monitor patient's fluid volume status - pt getting IV lasix + diuretics --    Individualized Interventions/Recommendations #4 (if applicable) --  Strict I&Os as patient is on diuretics --    Individualized Interventions/Recommendations #5 (if applicable) monitor renal function --  --    Outcome Evaluation (rationale for progressing/not progressing) every shift --  --  --     06/07/17 0026   Patient /Family stated Goal   Patient /Family stated Goal --    Adult/Peds Plan of Care   Guidelines --    Individualized Interventions/Recommendations #1 --    Individualized Interventions/Recommendations #2 (if applicable) --    Individualized Interventions/Recommendations #3 (if applicable) --    Individualized Interventions/Recommendations #4 (if applicable) --    Individualized Interventions/Recommendations #5 (if applicable) --    Outcome Evaluation (rationale for progressing/not progressing) every shift Pt's VSS, edema 2-3+ BLE. 1.5 L/day fluid restrictions and compliant, strict I/O's and daily wt. Will cont' to monitor to assess. Progress to goal. See MAR.

## 2017-06-08 DIAGNOSIS — N179 Acute kidney failure, unspecified: Secondary | ICD-10-CM

## 2017-06-08 LAB — CBC WITH DIFF, BLOOD
ANC-Automated: 2.6 10*3/uL (ref 1.6–7.0)
Abs Basophils: 0 10*3/uL (ref <0.1)
Abs Eosinophils: 0 10*3/uL (ref 0.1–0.5)
Abs Lymphs: 0.5 10*3/uL — ABNORMAL LOW (ref 0.8–3.1)
Abs Monos: 0.1 10*3/uL — ABNORMAL LOW (ref 0.2–0.8)
Basophils: 0 %
Eosinophils: 0 %
Hct: 29.5 % — ABNORMAL LOW (ref 34.0–45.0)
Hgb: 9.1 gm/dL — ABNORMAL LOW (ref 11.2–15.7)
Imm Gran %: 7 % — ABNORMAL HIGH (ref <1)
Imm Gran Abs: 0.2 10*3/uL — ABNORMAL HIGH (ref <0.1)
Lymphocytes: 15 %
MCH: 24.9 pg — ABNORMAL LOW (ref 26.0–32.0)
MCHC: 30.8 g/dL — ABNORMAL LOW (ref 32.0–36.0)
MCV: 80.6 um3 (ref 79.0–95.0)
Monocytes: 4 %
Plt Count: 86 10*3/uL — ABNORMAL LOW (ref 140–370)
RBC: 3.66 10*6/uL — ABNORMAL LOW (ref 3.90–5.20)
RDW: 18.5 % — ABNORMAL HIGH (ref 12.0–14.0)
Segs: 74 %
WBC: 3.6 10*3/uL — ABNORMAL LOW (ref 4.0–10.0)

## 2017-06-08 LAB — BASIC METABOLIC PANEL, BLOOD
Anion Gap: 15 mmol/L (ref 7–15)
Anion Gap: 19 mmol/L — ABNORMAL HIGH (ref 7–15)
BUN: 110 mg/dL — ABNORMAL HIGH (ref 8–23)
BUN: 112 mg/dL — ABNORMAL HIGH (ref 8–23)
Bicarbonate: 24 mmol/L (ref 22–29)
Bicarbonate: 21 mmol/L — ABNORMAL LOW (ref 22–29)
Calcium: 9.2 mg/dL (ref 8.5–10.6)
Calcium: 8.9 mg/dL (ref 8.5–10.6)
Chloride: 94 mmol/L — ABNORMAL LOW (ref 98–107)
Chloride: 93 mmol/L — ABNORMAL LOW (ref 98–107)
Creatinine: 2.28 mg/dL — ABNORMAL HIGH (ref 0.51–0.95)
Creatinine: 2.21 mg/dL — ABNORMAL HIGH (ref 0.51–0.95)
GFR: 21 mL/min
GFR: 22 mL/min
Glucose: 130 mg/dL — ABNORMAL HIGH (ref 70–99)
Glucose: 161 mg/dL — ABNORMAL HIGH (ref 70–99)
Potassium: 4.2 mmol/L (ref 3.5–5.1)
Potassium: 4.6 mmol/L (ref 3.5–5.1)
Sodium: 133 mmol/L — ABNORMAL LOW (ref 136–145)
Sodium: 133 mmol/L — ABNORMAL LOW (ref 136–145)

## 2017-06-08 LAB — MAGNESIUM, BLOOD
Magnesium: 2.1 mg/dL (ref 1.6–2.4)
Magnesium: 2 mg/dL (ref 1.6–2.4)

## 2017-06-08 LAB — PHOSPHORUS, BLOOD: Phosphorous: 6.2 mg/dL — ABNORMAL HIGH (ref 2.7–4.5)

## 2017-06-08 MED ORDER — METOLAZONE 5 MG OR TABS
5.00 mg | ORAL_TABLET | Freq: Every day | ORAL | Status: DC
Start: 2017-06-09 — End: 2017-06-08

## 2017-06-08 MED ORDER — CHLOROTHIAZIDE SODIUM 500 MG IV SOLR
500.00 mg | Freq: Three times a day (TID) | INTRAVENOUS | Status: DC
Start: 2017-06-08 — End: 2017-06-08
  Administered 2017-06-08: 500 mg via INTRAVENOUS
  Filled 2017-06-08 (×3): qty 18

## 2017-06-08 MED ORDER — CHLOROTHIAZIDE SODIUM 500 MG IV SOLR
500.00 mg | Freq: Three times a day (TID) | INTRAVENOUS | Status: DC
Start: 2017-06-09 — End: 2017-06-09
  Administered 2017-06-09 (×2): 500 mg via INTRAVENOUS
  Filled 2017-06-08 (×4): qty 18

## 2017-06-08 MED ORDER — METOLAZONE 5 MG OR TABS
5.00 mg | ORAL_TABLET | Freq: Once | ORAL | Status: AC
Start: 2017-06-08 — End: 2017-06-08
  Administered 2017-06-08: 5 mg via ORAL
  Filled 2017-06-08: qty 1

## 2017-06-08 NOTE — Plan of Care (Signed)
Problem: Promotion of Health and Safety  Goal: Promotion of Health and Safety  The patient remains safe, receives appropriate treatment and achieves optimal outcomes (physically, psychosocially, and spiritually) within the limitations of the disease process by discharge.    Information below is the current care plan.  Outcome: Progressing   06/08/17 1054   Adult/Peds Plan of Care   Guidelines Inpatient Nursing Guidelines   Individualized Interventions/Recommendations #1 Monitor patient's hemodynamics - pt is on diuretics + MD states that he will order another diuretic to add onto pt's current routine   Individualized Interventions/Recommendations #2 (if applicable) Pt's leg edema is improving - ACE wrap q day and take off during night   Individualized Interventions/Recommendations #3 (if applicable) Maintain pt's flolan administration + IV dopamine gtt   Individualized Interventions/Recommendations #4 (if applicable) Maintain strict I&Os

## 2017-06-08 NOTE — Progress Notes (Signed)
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 failure on 3Lpm now here with worsening LEx edema, AKI, now on dopamine and lasix gtt    Review of Systems:   All other systems were reviewed and are negative except as noted above    Current Medications:   chlorothiazide  500 mg Q12H    furosemide  100 mg 4x Daily    medroxyPROGESTERone  1.25 mg Q24H NR    nystatin  5 mL 4x Daily    potassium chloride  40 mEq BID    Prostacyclin Cassette Change   Daily    Prostacyclin Tubing   Once per day on Mon Wed Fri    Prostacylin Batteries   Once per day on Mon    sildenafil  80 mg TID    sodium chloride (PF)  3 mL Q8H    spironolactone  75 mg Daily      DOPamine 3 mcg/kg/min (06/08/17 0800)    epoprostenol (FLOLAN) infusion 35 ng/kg/min (06/08/17 0800)    sodium chloride        acetaminophen  650 mg Q6H PRN    loperamide  2 mg TID PRN    nalOXone  0.1 mg Q2 Min PRN    sodium chloride (PF)  3 mL PRN    sodium chloride   Continuous PRN       Vital Signs:  Temperature:  [97.8 F (36.6 C)-98.6 F (37 C)] 98 F (36.7 C) (05/27 0800)  Blood pressure (BP): (112-130)/(63-76) 119/72 (05/27 0800)  Heart Rate:  [92-106] 92 (05/27 0800)  Respirations:  [18-20] 20 (05/27 0800)  Pain Score: 0 (05/27 0800)  O2 Device: Nasal cannula (05/27 0350)  O2 Flow Rate (L/min):  [3 l/min] 3 l/min (05/27 0350)  SpO2:  [95 %-97 %] 97 % (05/27 0800)  Wt Readings from Last 1 Encounters:   06/08/17 65.6 kg (144 lb 11.2 oz)     Intake/Output (Current Shift):  05/26 0600 - 05/27 0559  In: 1378.1 [P.O.:960; I.V.:418.1]  Out: 5176 [Urine:3850]  Stool x4 today    Physical Exam:  Gen: A&O, conversing comfortably  Eyes: EOMI, pupils equal, anicteric  HENT: Atraumatic, normocephalic  Neck: + JVD, trachea midline  CV: RRR, +murmur, accentuated S2, No R/G/S3. L tunneled IJ CVC site is CDI  Resp: stable  bibasilar crackles, no intercostal retractions/normal effort  Abdo: NT mild D, soft, no masses.  Ext: +2 BLEx edema  Neuro: No focal weakness or sensory deficit, CN grossly intact  Psych: Appropriate affect and mood, good insight. AnOx3  Skin: Normal temperature, no rash or nodules. Diffuse erythema, likely related to IV Epo; + brusing.     LABS:  Lab Results   Component Value Date    NA 133 (L) 06/08/2017    K 4.2 06/08/2017    CL 94 (L) 06/08/2017    BICARB 24 06/08/2017    BUN 110 (H) 06/08/2017    CREAT 2.28 (H) 06/08/2017    GLU 130 (H) 06/08/2017    [place] 9.2 06/08/2017     ASSESSMENT AND PLAN  67 yo F with WHO I PAH associated with SLE and prior anorexogen use admitted with acute on chronic right heart failure and volume overload as well as AKI and thrombocytopenia.Persistent edema.  Minimal progress on  exam and weight despite negative 20 L by I/O.    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   - Diuril 500 mg IV BID  - Continue Spironolactone  - Continue dopamine to 3 mcg/kg/min to help with diuresis  -Trial of solumedrol 30 mg IV daily x 3 doses (day 3) to determine if improves urin output/renal fxn  - Continue home sildenafil and flolan at 35 ng/kg/min  - Continue oxygen suppl  - Fluid restrict to 1.5 L/day      [name] MD  Pager [phone]

## 2017-06-08 NOTE — Plan of Care (Signed)
Problem: Promotion of Health and Safety  Goal: Promotion of Health and Safety  The patient remains safe, receives appropriate treatment and achieves optimal outcomes (physically, psychosocially, and spiritually) within the limitations of the disease process by discharge.    Information below is the current care plan.  Outcome: Progressing   06/02/17 0529 06/06/17 1013 06/06/17 1945   Patient /Family stated Goal   Patient /Family stated Goal --  --  to get better   Adult/Peds Plan of Care   Individualized Interventions/Recommendations #1 --  --  --    Individualized Interventions/Recommendations #3 (if applicable) --  --  --    Individualized Interventions/Recommendations #4 (if applicable) --  Strict I&Os as patient is on diuretics --    Individualized Interventions/Recommendations #5 (if applicable) monitor renal function --  --    Outcome Evaluation (rationale for progressing/not progressing) every shift --  --  --     06/07/17 0026 06/07/17 1112 06/08/17 0349   Patient /Family stated Goal   Patient /Family stated Goal --  --  --    Adult/Peds Plan of Care   Individualized Interventions/Recommendations #1 --  --  Pt to diurese and reduced edema. Will administer diuretics and continue to monitor output and daily weight.   Individualized Interventions/Recommendations #3 (if applicable) --  Monitor patient's fluid volume status - pt is currently on IVP lasix QID + other diuretics --    Individualized Interventions/Recommendations #4 (if applicable) --  --  --    Individualized Interventions/Recommendations #5 (if applicable) --  --  --    Outcome Evaluation (rationale for progressing/not progressing) every shift Pt's VSS, edema 2-3+ BLE. 1.5 L/day fluid restrictions and compliant, strict I/O's and daily wt. Will cont' to monitor to assess. Progress to goal. See MAR.  --  --

## 2017-06-09 LAB — BASIC METABOLIC PANEL, BLOOD
Anion Gap: 16 mmol/L — ABNORMAL HIGH (ref 7–15)
Anion Gap: 18 mmol/L — ABNORMAL HIGH (ref 7–15)
BUN: 112 mg/dL — ABNORMAL HIGH (ref 8–23)
BUN: 120 mg/dL — ABNORMAL HIGH (ref 8–23)
Bicarbonate: 26 mmol/L (ref 22–29)
Bicarbonate: 22 mmol/L (ref 22–29)
Calcium: 9.3 mg/dL (ref 8.5–10.6)
Calcium: 8.7 mg/dL (ref 8.5–10.6)
Chloride: 93 mmol/L — ABNORMAL LOW (ref 98–107)
Chloride: 94 mmol/L — ABNORMAL LOW (ref 98–107)
Creatinine: 2.16 mg/dL — ABNORMAL HIGH (ref 0.51–0.95)
Creatinine: 2.16 mg/dL — ABNORMAL HIGH (ref 0.51–0.95)
GFR: 23 mL/min
GFR: 23 mL/min
Glucose: 109 mg/dL — ABNORMAL HIGH (ref 70–99)
Glucose: 114 mg/dL — ABNORMAL HIGH (ref 70–99)
Potassium: 4.3 mmol/L (ref 3.5–5.1)
Potassium: 4.4 mmol/L (ref 3.5–5.1)
Sodium: 135 mmol/L — ABNORMAL LOW (ref 136–145)
Sodium: 134 mmol/L — ABNORMAL LOW (ref 136–145)

## 2017-06-09 LAB — PHOSPHORUS, BLOOD: Phosphorous: 6.1 mg/dL — ABNORMAL HIGH (ref 2.7–4.5)

## 2017-06-09 LAB — MAGNESIUM, BLOOD
Magnesium: 2.1 mg/dL (ref 1.6–2.4)
Magnesium: 2 mg/dL (ref 1.6–2.4)

## 2017-06-09 MED ORDER — METOLAZONE 5 MG OR TABS
5.00 mg | ORAL_TABLET | ORAL | Status: DC
Start: 2017-06-10 — End: 2017-06-14
  Administered 2017-06-10 – 2017-06-13 (×4): 5 mg via ORAL
  Filled 2017-06-09 (×4): qty 1

## 2017-06-09 MED ORDER — METOLAZONE 5 MG OR TABS
5.00 mg | ORAL_TABLET | Freq: Once | ORAL | Status: AC
Start: 2017-06-09 — End: 2017-06-09
  Administered 2017-06-09: 5 mg via ORAL
  Filled 2017-06-09: qty 1

## 2017-06-09 MED ORDER — SIMETHICONE 80 MG OR CHEW
80.00 mg | CHEWABLE_TABLET | Freq: Four times a day (QID) | ORAL | Status: DC | PRN
Start: 2017-06-09 — End: 2017-07-15
  Administered 2017-06-09 – 2017-06-21 (×2): 80 mg via ORAL
  Filled 2017-06-09 (×2): qty 1

## 2017-06-09 MED ORDER — CHLOROTHIAZIDE SODIUM 500 MG IV SOLR
500.00 mg | Freq: Two times a day (BID) | INTRAVENOUS | Status: DC
Start: 2017-06-10 — End: 2017-06-11
  Administered 2017-06-10 – 2017-06-11 (×3): 500 mg via INTRAVENOUS
  Filled 2017-06-09 (×5): qty 18

## 2017-06-09 NOTE — Plan of Care (Signed)
Problem: Promotion of Health and Safety  Goal: Promotion of Health and Safety  The patient remains safe, receives appropriate treatment and achieves optimal outcomes (physically, psychosocially, and spiritually) within the limitations of the disease process by discharge.    Information below is the current care plan.  Outcome: Progressing   06/02/17 0529 06/08/17 1054 06/08/17 2000   Patient /Family stated Goal   Patient /Family stated Goal --  --  ("Walk a few lapse tonight.")   Adult/Peds Plan of Care   Guidelines --  Inpatient Nursing Guidelines --    Individualized Interventions/Recommendations #1 --  Monitor patient's hemodynamics - pt is on diuretics + MD states that he will order another diuretic to add onto pt's current routine --    Individualized Interventions/Recommendations #2 (if applicable) --  Pt's leg edema is improving - ACE wrap q day and take off during night --    Individualized Interventions/Recommendations #3 (if applicable) --  Maintain pt's flolan administration + IV dopamine gtt --    Individualized Interventions/Recommendations #4 (if applicable) --  Maintain strict I&Os --    Individualized Interventions/Recommendations #5 (if applicable) monitor renal function --  --    Outcome Evaluation (rationale for progressing/not progressing) every shift --  --  --     06/09/17 0124   Patient /Family stated Goal   Patient /Family stated Goal --    Adult/Peds Plan of Care   Guidelines --    Individualized Interventions/Recommendations #1 --    Individualized Interventions/Recommendations #2 (if applicable) --    Individualized Interventions/Recommendations #3 (if applicable) --    Individualized Interventions/Recommendations #4 (if applicable) --    Individualized Interventions/Recommendations #5 (if applicable) --    Outcome Evaluation (rationale for progressing/not progressing) every shift Pt's IV on RFA was leaking. Re-inserted new IV on lt wrist. Scheduled Furosemide IV administered. Pt is  compliant with strict I&Os & fluid restriction. Will continue to monitor pt's closely.

## 2017-06-09 NOTE — Plan of Care (Signed)
Problem: Promotion of Health and Safety  Goal: Promotion of Health and Safety  The patient remains safe, receives appropriate treatment and achieves optimal outcomes (physically, psychosocially, and spiritually) within the limitations of the disease process by discharge.    Information below is the current care plan.  Outcome: Progressing   06/09/17 0759 06/09/17 1624   Patient /Family stated Goal   Patient /Family stated Goal go for walks in hallway --    Adult/Peds Plan of Care   Guidelines --  Inpatient Nursing Guidelines   Individualized Interventions/Recommendations #1 --  administer dopamine gtt, diuretics as ordered, see MAR for details. continue fluid restriction as ordered, strict I/Os. maintain flolan administration   Individualized Interventions/Recommendations #2 (if applicable) --  sit in chair for all meals, increase activity as tolerated, continue oxygen therapy as ordered   Outcome Evaluation (rationale for progressing/not progressing) every shift --  Husband at bedside, active in plan of care. Patient ambulated in hallway today. Patient compliant with fluid restriction as ordered.

## 2017-06-09 NOTE — Progress Notes (Signed)
NEPHROLOGY INPATIENT PROGRESS NOTE    Diana Chambers is a 67 year old female with hx of Group 1 PAH associated with SLE and /Sjogren's disease, presented to the hospital with worsening legs swelling. 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        Events:   Received one dose of Metolazone yesterday with good UOP  UOP 3.8 lit, weight down.    Subjective/ROS  Feels better, no chest pain or SOB. Legs swelling is much better.       Allergies:  Allergies   Allergen Reactions    Allopurinol Hives    Levaquin Swelling and Other     Severe joint pain      Ofloxacin Nausea and Vomiting    Chlorhexidine Itching    Sulfa Drugs Unspecified       Medications:  Medications Prior to Admission   Medication Sig Dispense Refill Last Dose    azelastine (ASTELIN) 0.1 % nasal spray Spray 1 spray into each nostril 2 times daily. Use in each nostril as directed 1 bottle 3 Not Taking    Cholecalciferol 2000 UNITS CAPS Take 1 capsule by mouth daily.   Taking    digoxin (LANOXIN) 0.125 MG tablet Take 1 tablet (125 mcg) by mouth every evening. 30 tablet 3 Taking    Estradiol (DIVIGEL) 0.25 MG/0.25GM GEL Apply 0.25 mg topically nightly.   Taking    estradiol (ESTRING) 2 MG vaginal ring Insert 1 Ring vaginally Every 3 months. Follow package directions   Taking    ferrous sulfate 324 (65 FE) MG TBEC Take 1 tablet (324 mg) by mouth daily. 30 tablet 0 Taking    FLOLAN 1.5 MG IV SOLR 39RV/UY/EBX   Taking    FOLIC ACID 435 MCG OR TABS 1 TABLET DAILY   Taking    furosemide (LASIX) 40 MG tablet Take 2.5 tablets (100 mg) by mouth 3 times daily. 200 tablet 3 Taking    medroxyPROGESTERone (PROVERA) 2.5 MG tablet Take 1.25 mg by mouth daily.   Taking    potassium chloride (KLOR-CON) 10 MEQ tablet TAKE 4 TABLETS BY MOUTH DAILY. 120 each 0 Taking    [EXPIRED] sildenafil (REVATIO, VIAGRA) 20 MG tablet TAKE FOUR TABLETS BY MOUTH  THREE TIMES DAILY 360 each 0 Taking    spironolactone (ALDACTONE) 50 MG tablet TAKE 1 TABLET BY MOUTH DAILY. (Patient taking differently: Take 75 mg by mouth.  ) 30 each 0 Taking    vitamin b-12 (CYANOCOBALAMIN) 500 MCG tablet TAKE 1 TABLET BY MOUTH DAILY. 30 each 3 Taking     Physical Exam:  BP 118/70 (BP Location: Left arm, BP Patient Position: Sitting)    Pulse 93    Temp 98.6 F (37 C)    Resp 20    Ht 5\' 2"  (1.575 m)    Wt 64.4 kg (142 lb)    SpO2 95%    BMI 25.97 kg/m   HEENT: NC/AT  General: NAD, stting in chair,  Eyes: Anicetric sclera  Mouth: MMM  CV: RRR  Pulm: Better air entry, no crackles   Abd:  Soft, non tender,  distended  Ext: 3 mm edema on both LE. No hip edema. Improving     Labs:  Lab Results   Component Value Date    NA 135 (L) 06/09/2017    K 4.3 06/09/2017    CL 93 (L) 06/09/2017    BICARB 26  06/09/2017    BUN 112 (H) 06/08/2017    CREAT 2.16 (H) 06/09/2017    GLU 109 (H) 06/09/2017    [place] 9.3 06/09/2017     Lab Results   Component Value Date    WBC 3.6 (L) 06/08/2017    HGB 9.1 (L) 06/08/2017    HCT 29.5 (L) 06/08/2017    PLT 86 (L) 06/08/2017    SEG 74 06/08/2017    LYMPHS 15 06/08/2017    MONOS 4 06/08/2017    EOS 0 06/08/2017       Assessment and Recommendations:    # AKI on CKD in the setting of PAH/Rt sided HF, responding well to diuretics with Net negative every day but discordant weight and Is/Os.   -Responded well to metolazone yesterday  -Cr went down to 2.1 today.   -Would give another dose of metolazone today before lasix and continue with Lasix 100 mg QID IV and Diuril 500 mg IV BID.   # Lytes: Hyponatremia improved today. K acceptable   # Volume: Hypervolemic, continue diuresis, as above would do Lasix 100 mg QID IV, Diuril 500 BID and Aldactone 75 mg daily and add one dose of metolazone 5 mg.    # Acid-Base: Normal bicarb.  # MBD: phos slightly elevated but stable, continue renal diet and monitor for now.  No need for binders.     The above plan was discussed with the  attending, Dr. Duayne Cal, MD  Nephrology Fellow, PGY 4  Division of Nephrology-Hypertension   Pager: [phone]

## 2017-06-09 NOTE — Progress Notes (Addendum)
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 failure on 3Lpm now here with worsening LEx edema, AKI, now on dopamine. Tried lasix gtt, now back to lasix bolus.     INTERVAL EVENT  Got one dose of metolazone before PM lasix.   Similar amount of urine, but weight finally came down today.     MEDICATIONS  IV Drips   DOPamine 3 mcg/kg/min (06/09/17 0819)    epoprostenol (FLOLAN) infusion 35 ng/kg/min (06/09/17 1318)    sodium chloride       Scheduled Medications   [START ON 06/10/2017] chlorothiazide  500 mg Q12H    furosemide  100 mg 4x Daily    medroxyPROGESTERone  1.25 mg Q24H NR    [START ON 06/10/2017] metolazone  5 mg Q24H    metolazone  5 mg Once    nystatin  5 mL 4x Daily    potassium chloride  40 mEq BID    Prostacyclin Cassette Change   Daily    Prostacyclin Tubing   Once per day on Mon Wed Fri    Prostacylin Batteries   Once per day on Mon    sildenafil  80 mg TID    sodium chloride (PF)  3 mL Q8H    spironolactone  75 mg Daily     PRN Medications   acetaminophen  650 mg Q6H PRN 650 mg at 06/04/17 2152    loperamide  2 mg TID PRN 2 mg at 06/09/17 1109    nalOXone  0.1 mg Q2 Min PRN      simethicone  80 mg Q6H PRN      sodium chloride (PF)  3 mL PRN 3 mL at 05/30/17 2016    sodium chloride   Continuous PRN       VITALS   Latest Entry  Range (last 24 hours)    Temperature: 98.6 F (37 C)  Temp  Avg: 98.3 F (36.8 C)  Min: 97.8 F (36.6 C)  Max: 98.6 F (37 C)    Blood pressure (BP): 122/71  BP  Min: 99/53  Max: 138/77    Heart Rate: 108  Pulse  Avg: 100.7  Min: 93  Max: 112    Respirations: 20  Resp  Avg: 19.4  Min: 18  Max: 20    SpO2: 100 %  SpO2  Avg: 97.6 %  Min: 95 %  Max: 100 %       No Data Recorded     Weight: 64.4 kg (142 lb)  Percentage Weight Change (%): -1.73 %    Intake/Output       06/08/17 0600 - 06/09/17 0559 06/09/17 0600 -  06/10/17 0559      2130-8657 1800-0559 Total 0600-1759 8469-6295 Total       Intake    P.O.  1040  50 1090  600  -- 600    I.V.  125.6  114.3 240  3  -- 3    Total Intake 1165.6 164.3 1330 603 -- 603       Output    Urine  2200  1600 3800  1000  -- 1000    Total Output 2200 1600 3800 1000 -- 1000       Net I/O     -1034.4 -1435.7 -2470 -397 -- -  397        Physical Exam:  Gen: A&O, conversing comfortably  Eyes: EOMI, pupils equal, anicteric  HENT: Atraumatic, normocephalic  Neck: + JVD, trachea midline  CV: RRR, +murmur, accentuated S2, No R/G/S3. L tunneled IJ CVC site is CDI  Resp: stable bibasilar crackles, no intercostal retractions/normal effort  Abdo: NT mild D, soft, no masses.  Ext: 2+ ~ 3+ BLEx edema  Neuro: No focal weakness or sensory deficit, CN grossly intact  Psych: Appropriate affect and mood, good insight. AnOx3  Skin: Normal temperature, no rash or nodules. Diffuse erythema, likely related to IV Epo; + brusing.     Na 135* (05/28) CL 93* (05/28) BUN 112* (05/28) GLU   109* (05/28)   K 4.3 (05/28) CO2 26 (05/28) Cr 2.16* (05/28)      WBC 3.6* (05/27) HGB 9.1* (05/27) PLT 86* (05/27)    HCT 29.5* (05/27)      TP   ALT   TBILI   ALK PHOS      ALB 3.4* (05/26) AST   DBILI        PT   PTT     INR       No results found for: ARTPH, ARTPCO2, ARTPO2    ASSESSMENT AND PLAN  67 yo F with WHO I PAH associated with SLE and prior anorexogen use admitted with acute on chronic right heart failure and volume overload as well as AKI and thrombocytopenia.Persistent edema.  Minimal progress on exam and weight despite negative 20 L by I/O.    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 PBettey Mare    KChrissie NoaP. CBridgett Larsson MD  Fellow, [facility] and [facility] Office Phone: [phone] Pager: [phone]   Pulmonary Vascular Attending Addendum:  Patient was examined with house Dr. CAngus Seller imaging and tests reviewed.  Please see above note for full details and plan.    67yo F with WHO I PAH associated with SLE and prior anorexogen use admitted with acute on chronic right heart failure and volume overload as well as AKI and thrombocytopenia.Persistent edema.  Mild improvement on exam and decrease in weight (despite less overall urine output 3.2 L vs. 3.95 and less net negative by I/O compared to day prior???).    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   - Diuril 500 mg IV BID  - Metolazone 5 mg po daily prior to lasix  - Continue Spironolactone  - Continue dopamine to 3 mcg/kg/min to help with diuresis  - Completed  solumedrol 30 mg IV daily x 3 doses  - Continue home sildenafil and flolan at 35 ng/kg/min  - Continue oxygen suppl  - Fluid restrict to 1.5 L/day    [name] MD  Pager [phone]

## 2017-06-10 LAB — BASIC METABOLIC PANEL, BLOOD
Anion Gap: 16 mmol/L — ABNORMAL HIGH (ref 7–15)
Anion Gap: 16 mmol/L — ABNORMAL HIGH (ref 7–15)
BUN: 125 mg/dL — ABNORMAL HIGH (ref 8–23)
BUN: 122 mg/dL — ABNORMAL HIGH (ref 8–23)
Bicarbonate: 26 mmol/L (ref 22–29)
Bicarbonate: 28 mmol/L (ref 22–29)
Calcium: 8.7 mg/dL (ref 8.5–10.6)
Calcium: 8.5 mg/dL (ref 8.5–10.6)
Chloride: 93 mmol/L — ABNORMAL LOW (ref 98–107)
Chloride: 92 mmol/L — ABNORMAL LOW (ref 98–107)
Creatinine: 2.26 mg/dL — ABNORMAL HIGH (ref 0.51–0.95)
Creatinine: 1.1 mg/dL — ABNORMAL HIGH (ref 0.51–0.95)
GFR: 22 mL/min
GFR: 49 mL/min
Glucose: 83 mg/dL (ref 70–99)
Glucose: 140 mg/dL — ABNORMAL HIGH (ref 70–99)
Potassium: 4.1 mmol/L (ref 3.5–5.1)
Potassium: 4.4 mmol/L (ref 3.5–5.1)
Sodium: 135 mmol/L — ABNORMAL LOW (ref 136–145)
Sodium: 136 mmol/L (ref 136–145)

## 2017-06-10 LAB — CBC WITH DIFF, BLOOD
ANC-Manual Mode: 3.5 10*3/uL (ref 1.6–7.0)
Abs Basophils: 0 10*3/uL (ref <0.1)
Abs Eosinophils: 0 10*3/uL (ref 0.1–0.5)
Abs Lymphs: 1.4 10*3/uL (ref 0.8–3.1)
Abs Monos: 0.3 10*3/uL (ref 0.2–0.8)
Basophils: 0 %
Eosinophils: 0 %
Hct: 29.8 % — ABNORMAL LOW (ref 34.0–45.0)
Hgb: 9.2 gm/dL — ABNORMAL LOW (ref 11.2–15.7)
Lymphocytes: 26 %
MCH: 25.3 pg — ABNORMAL LOW (ref 26.0–32.0)
MCHC: 30.9 g/dL — ABNORMAL LOW (ref 32.0–36.0)
MCV: 82.1 um3 (ref 79.0–95.0)
MPV: 9.6 fL (ref 9.4–12.4)
Monocytes: 5 %
Plt Count: 71 10*3/uL — ABNORMAL LOW (ref 140–370)
RBC: 3.63 10*6/uL — ABNORMAL LOW (ref 3.90–5.20)
RDW: 18.6 % — ABNORMAL HIGH (ref 12.0–14.0)
Segs: 66 %
WBC: 5.2 10*3/uL (ref 4.0–10.0)

## 2017-06-10 LAB — PHOSPHORUS, BLOOD: Phosphorous: 5.8 mg/dL — ABNORMAL HIGH (ref 2.7–4.5)

## 2017-06-10 LAB — MDIFF
Bands: 1 % (ref 0–15)
Immature Granulocytes Absolute Manual: 0.1 10*3/uL (ref 0.0–0.1)
Metamyelocytes: 1 %
Myelocytes: 1 %
Number of Cells Counted: 114
Plt Est: DECREASED

## 2017-06-10 LAB — MAGNESIUM, BLOOD: Magnesium: 2 mg/dL (ref 1.6–2.4)

## 2017-06-10 NOTE — Progress Notes (Signed)
NEPHROLOGY INPATIENT PROGRESS NOTE    Diana Chambers is a 67 year old female with hx of Group 1 PAH associated with SLE and /Sjogren's disease, presented to the hospital with worsening legs swelling. 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        Events:   UOP Good   Weight down  Hemodynamically stable.   NAEO      Subjective/ROS  Feels better every day, no chest pain or SOB. Legs swelling is much better. OOB and asymptomatic.       Allergies:  Allergies   Allergen Reactions    Allopurinol Hives    Levaquin Swelling and Other     Severe joint pain      Ofloxacin Nausea and Vomiting    Chlorhexidine Itching    Sulfa Drugs Unspecified       Medications:  Medications Prior to Admission   Medication Sig Dispense Refill Last Dose    azelastine (ASTELIN) 0.1 % nasal spray Spray 1 spray into each nostril 2 times daily. Use in each nostril as directed 1 bottle 3 Not Taking    Cholecalciferol 2000 UNITS CAPS Take 1 capsule by mouth daily.   Taking    digoxin (LANOXIN) 0.125 MG tablet Take 1 tablet (125 mcg) by mouth every evening. 30 tablet 3 Taking    Estradiol (DIVIGEL) 0.25 MG/0.25GM GEL Apply 0.25 mg topically nightly.   Taking    estradiol (ESTRING) 2 MG vaginal ring Insert 1 Ring vaginally Every 3 months. Follow package directions   Taking    ferrous sulfate 324 (65 FE) MG TBEC Take 1 tablet (324 mg) by mouth daily. 30 tablet 0 Taking    FLOLAN 1.5 MG IV SOLR 35TI/RW/ERX   Taking    FOLIC ACID 540 MCG OR TABS 1 TABLET DAILY   Taking    furosemide (LASIX) 40 MG tablet Take 2.5 tablets (100 mg) by mouth 3 times daily. 200 tablet 3 Taking    medroxyPROGESTERone (PROVERA) 2.5 MG tablet Take 1.25 mg by mouth daily.   Taking    potassium chloride (KLOR-CON) 10 MEQ tablet TAKE 4 TABLETS BY MOUTH DAILY. 120 each 0 Taking    [EXPIRED] sildenafil (REVATIO, VIAGRA) 20 MG tablet TAKE FOUR TABLETS BY  MOUTH THREE TIMES DAILY 360 each 0 Taking    spironolactone (ALDACTONE) 50 MG tablet TAKE 1 TABLET BY MOUTH DAILY. (Patient taking differently: Take 75 mg by mouth.  ) 30 each 0 Taking    vitamin b-12 (CYANOCOBALAMIN) 500 MCG tablet TAKE 1 TABLET BY MOUTH DAILY. 30 each 3 Taking     Physical Exam:  BP 118/70 (BP Location: Left arm, BP Patient Position: Semi-Fowlers)    Pulse 99    Temp 98 F (36.7 C)    Resp 18    Ht 5\' 2"  (1.575 m)    Wt 63.7 kg (140 lb 6.4 oz)    SpO2 97%    BMI 25.68 kg/m   HEENT: NC/AT  General: NAD, stting in chair,  Eyes: Anicetric sclera  Mouth: MMM  CV: RRR  Pulm: Good air entry, CTAB, no crackles   Abd:  Soft, non tender,  distended  Ext: 2 mm edema on both LE. No hip edema. Improving     Labs:  Lab Results   Component Value Date    NA 135 (L) 06/10/2017    K 4.1 06/10/2017    CL 93 (  L) 06/10/2017    BICARB 26 06/10/2017    BUN 125 (H) 06/10/2017    CREAT 2.26 (H) 06/10/2017    GLU 83 06/10/2017     8.7 06/10/2017     Lab Results   Component Value Date    WBC 3.6 (L) 06/08/2017    HGB 9.1 (L) 06/08/2017    HCT 29.5 (L) 06/08/2017    PLT 86 (L) 06/08/2017    SEG 74 06/08/2017    LYMPHS 15 06/08/2017    MONOS 4 06/08/2017    EOS 0 06/08/2017       Assessment and Recommendations:    # AKI on CKD in the setting of PAH/Rt sided HF, responding well to diuretics with Net negative every day but discordant weight and Is/Os.   -Cr stable   -Responding well to metolazone.   -Would continue metolazone right before lasix dose, Lasix 100 mg QID IV and Diuril 500 mg IV BID.   # Lytes: Hyponatremia continues to improve. K nl.  # Volume: Hypervolemic, continue diuresis, as above would do Lasix 100 mg QID IV, Diuril 500 BID and Aldactone 75 mg daily and add one dose of metolazone 5 mg.    -Would probably need to switch to oral lasix tomorrow.  # Acid-Base: Normal bicarb.  # MBD: phos slightly elevated but stable, continue renal diet and monitor for now.  No need for binders.     The above plan was  discussed with the attending, Dr. Duayne Cal, MD  Nephrology Fellow, PGY 4  Division of Nephrology-Hypertension   Pager: [phone]

## 2017-06-10 NOTE — Plan of Care (Signed)
Problem: Promotion of Health and Safety  Goal: Promotion of Health and Safety  The patient remains safe, receives appropriate treatment and achieves optimal outcomes (physically, psychosocially, and spiritually) within the limitations of the disease process by discharge.    Information below is the current care plan.  Outcome: Progressing   06/10/17 0540 06/10/17 0800 06/10/17 1324   Patient /Family stated Goal   Patient /Family stated Goal --  Get rid of the fluid --    Adult/Peds Plan of Care   Guidelines Inpatient Nursing Guidelines --  --    Individualized Interventions/Recommendations #1 --  --  Monitor pt's response while on dopamine gtt and flolan infusion   Individualized Interventions/Recommendations #2 (if applicable) --  --  Monitor Strict I/0's, daily weights and maintain 1500 ml fluid restriction   Individualized Interventions/Recommendations #3 (if applicable) --  --  Monitor flolan infusion, ensure patency of line and infusion running; ensure backup PIV remains patent/intact   Individualized Interventions/Recommendations #4 (if applicable) --  --  Encourage pt to increase activity as tolerated and elevate BLE while resting to improve edema   Individualized Interventions/Recommendations #5 (if applicable) --  --  Pt tolerating dopamine and VSS. Pt diuresing good amounts and negative balance. Flolan infusing as ordered; cassette and tubing changed today as ordered. Safety maintained.

## 2017-06-10 NOTE — Plan of Care (Incomplete)
Problem: Promotion of Health and Safety  Goal: Promotion of Health and Safety  The patient remains safe, receives appropriate treatment and achieves optimal outcomes (physically, psychosocially, and spiritually) within the limitations of the disease process by discharge.    Information below is the current care plan.  Outcome: Progressing

## 2017-06-10 NOTE — Progress Notes (Signed)
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 failure on 3Lpm now here with worsening LEx edema, AKI, now on dopamine. Tried lasix gtt, now back to lasix bolus.       MEDICATIONS  IV Drips   DOPamine 3 mcg/kg/min (06/10/17 0700)    epoprostenol (FLOLAN) infusion 35 ng/kg/min (06/10/17 1408)    sodium chloride       Scheduled Medications   chlorothiazide  500 mg Q12H    furosemide  100 mg 4x Daily    medroxyPROGESTERone  1.25 mg Q24H NR    metolazone  5 mg Q24H    potassium chloride  40 mEq BID    Prostacyclin Cassette Change   Daily    Prostacyclin Tubing   Once per day on Mon Wed Fri    Prostacylin Batteries   Once per day on Mon    sildenafil  80 mg TID    sodium chloride (PF)  3 mL Q8H    spironolactone  75 mg Daily     PRN Medications   acetaminophen  650 mg Q6H PRN 650 mg at 06/04/17 2152    loperamide  2 mg TID PRN 2 mg at 06/10/17 1124    nalOXone  0.1 mg Q2 Min PRN      simethicone  80 mg Q6H PRN 80 mg at 06/09/17 1851    sodium chloride (PF)  3 mL PRN 3 mL at 05/30/17 2016    sodium chloride   Continuous PRN       VITALS   Latest Entry  Range (last 24 hours)    Temperature: 98.1 F (36.7 C)  Temp  Avg: 98.3 F (36.8 C)  Min: 97.8 F (36.6 C)  Max: 98.6 F (37 C)    Blood pressure (BP): 126/68  BP  Min: 99/53  Max: 138/77    Heart Rate: 98  Pulse  Avg: 100.7  Min: 93  Max: 112    Respirations: 20  Resp  Avg: 19.4  Min: 18  Max: 20    SpO2: 98 %  SpO2  Avg: 97.6 %  Min: 95 %  Max: 100 %       No Data Recorded     Weight: 63.7 kg (140 lb 6.4 oz)  Percentage Weight Change (%): -1.13 %    Intake/Output       06/09/17 0600 - 06/10/17 0559 06/10/17 0600 - 06/11/17 0559      7062-3762 8315-1761 Total 0600-1759 1800-0559 Total       Intake    P.O.  900  550 1450  357  -- 357    I.V.  127.9  114.7 242.6  --  -- --    Total Intake 1027.9 664.7 1692.6  357 -- 357       Output    Urine  1650  1925 3575  1600  -- 1600    Total Output 1650 1925 3575 1600 -- 1600       Net I/O     -622.1 -1260.3 -1882.4 -6073 -- -1243        Physical Exam:  Gen: A&O, conversing comfortably  Eyes: EOMI, pupils equal, anicteric  HENT: Atraumatic, normocephalic  Neck: + JVD, trachea midline  CV: RRR, +murmur, accentuated S2, No R/G/S3. L tunneled IJ CVC  site is CDI  Resp: stable bibasilar crackles, no intercostal retractions/normal effort  Abdo: NT mild D, soft, no masses.  Ext: 2+ BLEx edema  Neuro: No focal weakness or sensory deficit, CN grossly intact  Psych: Appropriate affect and mood, good insight. AnOx3  Skin: Normal temperature, no rash or nodules. Diffuse erythema, likely related to IV Epo; + brusing.     Na 135* (05/29) CL 93* (05/29) BUN 125* (05/29) GLU   83 (05/29)   K 4.1 (05/29) CO2 26 (05/29) Cr 2.26* (05/29)      WBC 3.6* (05/27) HGB 9.1* (05/27) PLT 86* (05/27)    HCT 29.5* (05/27)      TP   ALT   TBILI   ALK PHOS      ALB   AST   DBILI        PT   PTT     INR       No results found for: ARTPH, ARTPCO2, ARTPO2    ASSESSMENT AND PLAN  67 yo F with WHO I PAH associated with SLE and prior anorexogen use admitted with acute on chronic right heart failure and volume overload as well as AKI and thrombocytopenia.Persistent edema.  Minimal progress on exam and weight despite negative 20 L by I/O.    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

## 2017-06-10 NOTE — Interdisciplinary (Signed)
06/10/17 1530   Follow Up/Progress   Is the Patient Ready for Discharge No   Supplies/Services  Home health   Barriers to Discharge Awaiting clinical improvement   Patient/Family/Legal/Surrogate Decision Maker Has Been Given Options And Choice In The Selection of Post-Acute Care Providers Yes   Family/Caregiver's Assessed for Readiness, willingness, and ability to provide or support self-management activities   Respite Care Not Applicable   Patient/Family Are In Agreement With Discharge Plan Yes   Public Health Clearance Needed Not Applicable   Plan/Interventions Explore needs and options for aftercare, provide referrals;Ongoing weekly therapeutic interventions     Medical necessity Reason for continued Hospital Stay:  acute on chronic right heart failure and volume overload as well as AKI and thrombocytopenia.Persistent edema.  ?  Interventions requiring continued Hospitalization/Plan of Care:  -Cont lasix IVP diuresis  -Cont dopamine gtt  - Continuous supplemental O2 @ 3L per NC  - Cont IVP diuril  - Cont flolan gtt  ?  Anticipated discharge plan at this time (home, SNF, etc.):  Home w/ HH  ?  Barriers to Discharge:  None.  ?  Expected DC Date:  TBD.    Pt is currently on service with [employer], for Flolan gtt. Pt on service with [employer], for Oxygen DME. CM to confirm service resumption with both companies, upon discharge. MD to trial solumedrol for treatment of AKI.  CM to continue to follow clinical progress.

## 2017-06-11 LAB — BASIC METABOLIC PANEL, BLOOD
Anion Gap: 15 mmol/L (ref 7–15)
Anion Gap: 19 mmol/L — ABNORMAL HIGH (ref 7–15)
BUN: 126 mg/dL — ABNORMAL HIGH (ref 8–23)
BUN: 123 mg/dL — ABNORMAL HIGH (ref 8–23)
Bicarbonate: 28 mmol/L (ref 22–29)
Bicarbonate: 27 mmol/L (ref 22–29)
Calcium: 8.6 mg/dL (ref 8.5–10.6)
Calcium: 8.6 mg/dL (ref 8.5–10.6)
Chloride: 92 mmol/L — ABNORMAL LOW (ref 98–107)
Chloride: 91 mmol/L — ABNORMAL LOW (ref 98–107)
Creatinine: 2.31 mg/dL — ABNORMAL HIGH (ref 0.51–0.95)
Creatinine: 2.28 mg/dL — ABNORMAL HIGH (ref 0.51–0.95)
GFR: 21 mL/min
GFR: 21 mL/min
Glucose: 89 mg/dL (ref 70–99)
Glucose: 108 mg/dL — ABNORMAL HIGH (ref 70–99)
Potassium: 3.6 mmol/L (ref 3.5–5.1)
Potassium: 3.9 mmol/L (ref 3.5–5.1)
Sodium: 135 mmol/L — ABNORMAL LOW (ref 136–145)
Sodium: 137 mmol/L (ref 136–145)

## 2017-06-11 LAB — MAGNESIUM, BLOOD
Magnesium: 2 mg/dL (ref 1.6–2.4)
Magnesium: 2 mg/dL (ref 1.6–2.4)

## 2017-06-11 LAB — PHOSPHORUS, BLOOD: Phosphorous: 5.6 mg/dL — ABNORMAL HIGH (ref 2.7–4.5)

## 2017-06-11 MED ORDER — CHLOROTHIAZIDE SODIUM 500 MG IV SOLR
500.00 mg | Freq: Every day | INTRAVENOUS | Status: DC
Start: 2017-06-12 — End: 2017-06-12
  Administered 2017-06-12 (×2): 500 mg via INTRAVENOUS
  Filled 2017-06-11: qty 18

## 2017-06-11 NOTE — Progress Notes (Signed)
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 failure on 3Lpm now here with worsening LEx edema, AKI, now on dopamine. Tried lasix gtt, now back to lasix bolus.     INTERVAL:  Husband arrived from out of town and staying with her  Tried metolazone for 3 days  S/p steroid for 3 doses      MEDICATIONS  IV Drips   DOPamine 3 mcg/kg/min (06/11/17 1007)    epoprostenol (FLOLAN) infusion 35 ng/kg/min (06/11/17 1434)    sodium chloride       Scheduled Medications   [START ON 06/12/2017] chlorothiazide  500 mg Daily    furosemide  100 mg 4x Daily    medroxyPROGESTERone  1.25 mg Q24H NR    metolazone  5 mg Q24H    potassium chloride  40 mEq BID    Prostacyclin Cassette Change   Daily    Prostacyclin Tubing   Once per day on Mon Wed Fri    Prostacylin Batteries   Once per day on Mon    sildenafil  80 mg TID    sodium chloride (PF)  3 mL Q8H    spironolactone  75 mg Daily     PRN Medications   acetaminophen  650 mg Q6H PRN 650 mg at 06/04/17 2152    loperamide  2 mg TID PRN 2 mg at 06/11/17 1109    nalOXone  0.1 mg Q2 Min PRN      simethicone  80 mg Q6H PRN 80 mg at 06/09/17 1851    sodium chloride (PF)  3 mL PRN 3 mL at 05/30/17 2016    sodium chloride   Continuous PRN       VITALS   Latest Entry  Range (last 24 hours)    Temperature: 98.3 F (36.8 C)  Temp  Avg: 98.3 F (36.8 C)  Min: 97.8 F (36.6 C)  Max: 98.6 F (37 C)    Blood pressure (BP): 107/59  BP  Min: 99/53  Max: 138/77    Heart Rate: 98  Pulse  Avg: 100.7  Min: 93  Max: 112    Respirations: 18  Resp  Avg: 19.4  Min: 18  Max: 20    SpO2: 93 %  SpO2  Avg: 97.6 %  Min: 95 %  Max: 100 %       No Data Recorded     Weight: 62.6 kg (138 lb 0.1 oz)  Percentage Weight Change (%): -1.7 %    Intake/Output       06/10/17 0600 - 06/11/17 0559 06/11/17 0600 - 06/12/17 0559      8756-4332 9518-8416 Total  0600-1759 6063-0160 Total       Intake    P.O.  957  537 1494  480  -- 480    I.V.  81.4  168.4 249.8  3  -- 3    Total Intake 1038.4 705.4 1743.8 483 -- 483       Output    Urine  2400  1970 4370  2500  -- 2500    Total Output 2400 1970 4370 2500 -- 2500       Net I/O     -1361.6 -1264.6 -2626.2 -2017 -- -2017        Physical Exam:  Gen: A&O,  conversing comfortably  Eyes: EOMI, pupils equal, anicteric  HENT: Atraumatic, normocephalic  Neck: + JVD, trachea midline  CV: RRR, +murmur, accentuated S2, No R/G/S3. L tunneled IJ CVC site is CDI  Resp: stable bibasilar crackles, no intercostal retractions/normal effort  Abdo: NT mild D, soft, no masses.  Ext: 2+ BLEx edema, improved  Neuro: No focal weakness or sensory deficit, CN grossly intact  Psych: Appropriate affect and mood, good insight. AnOx3  Skin: Normal temperature, no rash or nodules. Diffuse erythema, likely related to IV Epo; + brusing.     Na 135* (05/30) CL 92* (05/30) BUN 126* (05/30) GLU   89 (05/30)   K 3.6 (05/30) CO2 28 (05/30) Cr 2.31* (05/30)      WBC 5.2 (05/29) HGB 9.2* (05/29) PLT 71* (05/29)    HCT 29.8* (05/29)      TP   ALT   TBILI   ALK PHOS      ALB   AST   DBILI        PT   PTT     INR       No results found for: ARTPH, ARTPCO2, ARTPO2    ASSESSMENT AND PLAN  67 yo F with WHO I PAH associated with SLE and prior anorexogen use admitted with acute on chronic right heart failure and volume overload as well as AKI and thrombocytopenia.Persistent edema.  Minimal progress on exam and weight despite negative 20 L by I/O.    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  - Continue Spironolactone  - Continue dopamine to 3 mcg/kg/min to help with diuresis  - s/p Trial of  solumedrol 30 mg IV daily x 3 doses  - Continue home sildenafil and flolan at 35 ng/kg/min  - Continue oxygen suppl  - Fluid restrict to 1.5 L/day    This patient was seen and discussed with my attending Dr. PBettey Mare    KChrissie NoaP. CBridgett Larsson MD  Fellow, Pulmonary and [facility] Office Phone: [phone] Pager: [phone]

## 2017-06-11 NOTE — Plan of Care (Signed)
Problem: Promotion of Health and Safety  Goal: Promotion of Health and Safety  The patient remains safe, receives appropriate treatment and achieves optimal outcomes (physically, psychosocially, and spiritually) within the limitations of the disease process by discharge.    Information below is the current care plan.  Outcome: Progressing   06/10/17 1324 06/10/17 1937 06/11/17 0542   Patient /Family stated Goal   Patient /Family stated Goal --  lose weight in AM --    Adult/Peds Plan of Care   Guidelines --  --  Inpatient Nursing Guidelines   Individualized Interventions/Recommendations #1 Monitor pt's response while on dopamine gtt and flolan infusion --  --    Individualized Interventions/Recommendations #2 (if applicable) --  --  monitor strict I&Os, qd weights, fluid restriction   Individualized Interventions/Recommendations #3 (if applicable) --  --  monitor respiratory status and fluid status. s/s increasing SOB   Individualized Interventions/Recommendations #4 (if applicable) --  --  encourage pt oob to chair and walking in unit. Encourage independence   Outcome Evaluation (rationale for progressing/not progressing) every shift --  --  Pt Aox4, no pain tolerating dopa and flolan WL. 3L NC at baseline, crackles, and diuresis with IV diurel and Lasix. voiding in hat, calling appropriatly, electrolyrte replacement

## 2017-06-11 NOTE — Plan of Care (Signed)
Problem: Promotion of Health and Safety  Goal: Promotion of Health and Safety  The patient remains safe, receives appropriate treatment and achieves optimal outcomes (physically, psychosocially, and spiritually) within the limitations of the disease process by discharge.    Information below is the current care plan.  Outcome: Progressing   06/10/17 1324 06/10/17 1937 06/11/17 0542   Patient /Family stated Goal   Patient /Family stated Goal --  lose weight in AM --    Adult/Peds Plan of Care   Guidelines --  --  Inpatient Nursing Guidelines   Individualized Interventions/Recommendations #1 Monitor pt's response while on dopamine gtt and flolan infusion --  --    Individualized Interventions/Recommendations #2 (if applicable) --  --  monitor strict I&Os, qd weights, fluid restriction   Individualized Interventions/Recommendations #3 (if applicable) --  --  monitor respiratory status and fluid status. s/s increasing SOB   Individualized Interventions/Recommendations #4 (if applicable) --  --  encourage pt oob to chair and walking in unit. Encourage independence   Individualized Interventions/Recommendations #5 (if applicable) --  --  electroylte replacement therapy   Outcome Evaluation (rationale for progressing/not progressing) every shift --  --  Pt Aox4, no pain tolerating dopa and flolan WL. 3L NC at baseline, crackles, and diuresis with IV diurel and Lasix. voiding in hat, calling appropriatly, electrolyrte replacement

## 2017-06-11 NOTE — Interdisciplinary (Signed)
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 failure on 3Lpm now here with worsening LEx edema, AKI, now on dopamine. Tried lasix gtt, now back to lasix bolus.     Anthropometrics   Height: 5\' 2"  (157.5 cm)   Weight: 62.6 kg (138 lb 0.1 oz)      IBW= 110lb  %IBW= 125%  Body mass index is 25.24 kg/m.   UBW:  135-139lb per DTR 05/18/17  WT Change:  -9# since last wt reviewed at 147# on 5/20 (6% loss). Pt on lasix. Per spouse: wt loss is d/t fluid loss  Date Weight Recorded 06/11/2017 06/10/2017 06/09/2017 06/08/2017 06/08/2017 06/07/2017   Metric 62.6 kg 63.685 kg 64.411 kg 65.545 kg 65.635 kg 65.6 kg   Pounds/Ounces 138 lb 0.1 oz 140 lb 6.4 oz 142 lb 144 lb 8 oz 144 lb 11.2 oz 144 lb 10 oz      Diet:Diet Regular; 1550ml Volume Restriction  PO Intake: Average intake ~ 97% x 21 meals, ~ 3.7 items consumed per meal tray.  PO: consistent with last visit: 95% x 15 meals. Appt: "eats like a horse" per spouse. Pt was asleep at the time of visit, all data obtained from Epic or pts spouse.  Tray Items Taken for the past 168 hrs:   Number of Items Taken Number of Items on Tray Diet Tolerance   06/04/17 1300 4 4 Tolerates   06/04/17 1731 4 4 Tolerates   06/05/17 0849 4 4 Tolerates   06/05/17 1349 3 3 Tolerates   06/05/17 1843 5 5 Tolerates   06/06/17 0900 3 3 Tolerates   06/06/17 1300 3 3 Tolerates   06/06/17 1800 4 4 Tolerates   06/07/17 0800 3 3 Tolerates   06/07/17 1400 3 3 Tolerates   06/07/17 1800 4 4 Tolerates   06/08/17 0800 3 3 Tolerates   06/08/17 1200 3.5 5 Tolerates   06/08/17 1704 4 4 Tolerates   06/09/17 0819 3.75 4 Tolerates   06/09/17 1300 6 6 --   06/09/17 1759 2 3 --   06/10/17 0800 4 4 Tolerates   06/10/17 1254 4 4 Tolerates   06/10/17 1800 4 4 --   06/11/17 0900 3 3 Tolerates     No data found.  Allergies/Intolerances: NKFA  Allergies   Allergen Reactions    Allopurinol Hives    Levaquin Swelling and Other      Severe joint pain      Ofloxacin Nausea and Vomiting    Chlorhexidine Itching    Sulfa Drugs Unspecified     Chewing difficulty: none per spouse  GI: per pt denied nausea/vomiting. Intermittent diarrhea x past 2 days   Stool Assessment for the past 168 hrs:   Stool Appearance Stool Color Stool Amount Stool Source   06/04/17 1600 Separate hard lumps Brown Medium Rectum   06/04/17 2129 Soft formed [place] Rectum   06/05/17 0204 Soft formed Brown Small Rectum   06/05/17 0830 Soft formed Brown Small Rectum   06/05/17 1030 Soft formed Brown Medium Rectum   06/05/17 1349 Soft formed Brown -- Rectum   06/05/17 1843 Soft formed Brown Small Rectum   06/05/17 2123 Soft formed Brown Small Rectum   06/06/17 0633 Entirely liquid Brown Medium Rectum   06/09/17 0819 Soft formed Brown Medium Rectum   06/09/17 1258 Soft formed Brown Medium Rectum   06/09/17  1700 Soft formed Brown Small Rectum   06/10/17 0919 Soft formed Brown Medium Rectum   06/10/17 1126 Entirely liquid Brown Large Rectum   06/11/17 1007 Entirely liquid Brown Medium Rectum   06/11/17 1108 Entirely liquid -- -- Rectum     Skin: Per Head to Toe Assessment (5/30): Exceptions to WDL    Patient Lines/Drains/Airways Status    Active PICC Line / CVC Line / PIV Line / Drain / Airway / Intraosseous Line / Epidural Line / ART Line / Line Type / Wound     Name: Placement date: Placement time: Site: Days:    CVC Single Lumen - Present on admission Left Chest  --   --   Chest      Peripheral IV - 22 G Left Wrist  06/08/17   2104   Wrist  2    Peripheral IV - 22 G Right Forearm  06/09/17   1755   Forearm  1    Impaired Skin Integrity -  Laceration Toe (Comment  which one) Left  06/07/17   2200   3              Edema: Per Head to Toe Assessment (5/30):  Generalized  --  Anasarca  --  --  Anasarca       [place]      RG   RL Extremity  --  +2;Ankle;[place]-  ng;Mid Calf  --  --  +2;Ankle;[place]-  ng;Mid Calf       [place]      RG   LL Extremity  --  +2;Ankle;Mid  Calf;Pitting  --  --   +2;Ankle;Mid  Calf;Pitting       [place]           Labs:    Lab Results   Component Value Date    PREALB 26 06/06/2017    A1C 5.5 07/20/2016      Recent Labs     06/09/17  0555 06/09/17  1801 06/10/17  0608 06/10/17  1915 06/11/17  0640   NA 135* 134* 135* 136 135*   K 4.3 4.4 4.1 4.4 3.6   CL 93* 94* 93* 92* 92*   BICARB 26 22 26 28 28    BUN 112* 120* 125* 122* 126*   CREAT 2.16* 2.16* 2.26* 1.10* 2.31*   GLU 109* 114* 83 140* 89   [place] 9.3 8.7 8.7 8.5 8.6   MG 2.1 2.0 2.0  --  2.0   PHOS 6.1*  --  5.8*  --  5.6*     BS POCT: No results for input(s): GLUCPOCT in the last 72 hours.  Nutr Related MEDS:   Current Facility-Administered Medications   Medication    acetaminophen (TYLENOL) tablet 650 mg    chlorothiazide (DIURIL) injection 500 mg    DOPamine (INTROPIN) 400 mg/250 mL infusion (1600 mcg/mL)    epoprostenol (FLOLAN) 60,000 ng/mL in glycine diluent 100 mL infusion    furosemide (LASIX) injection 100 mg    loperamide (IMODIUM) capsule 2 mg    medroxyPROGESTERone (PROVERA) tablet 1.25 mg    metolazone (ZAROXOLYN) tablet 5 mg    nalOXone (NARCAN) injection 0.1 mg    potassium chloride (KLOR-CON M20) ER tablet 40 mEq    Prostacyclin Cassette Change    Prostacyclin Tubing    Prostacylin Batteries    sildenafil (REVATIO, VIAGRA) tablet 80 mg    simethicone (MYLICON) chewable tablet 80 mg    sodium chloride (PF) 0.9 % flush 3  mL    sodium chloride (PF) 0.9 % flush 3 mL    sodium chloride 0.9 % TKO infusion    spironolactone (ALDACTONE) tablet 75 mg     Education: if/when appropriate   PLAN: Continue to trend weight/labs/skin.  Will continue to honor food preferences with in dietary guidelines, monitor PO intake and diet tolerance. Will relay relevant, significant pt info and status change to Registered Dietitian.    Brookel White, DTR

## 2017-06-11 NOTE — Progress Notes (Signed)
NEPHROLOGY INPATIENT PROGRESS NOTE    Diana Chambers is a 67 year old female with hx of Group 1 PAH associated with SLE and /Sjogren's disease, presented to the hospital with worsening legs swelling. 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        Events:   UOP more 4.3 lit ---> Net neg 2.6 lit  Lost 2 lbs  Hemodynamically stable.   Continues on the IV lasix, diuril and metolazone.       Subjective/ROS  Legs swelling is much better, no chest pain or SOB    Allergies:  Allergies   Allergen Reactions    Allopurinol Hives    Levaquin Swelling and Other     Severe joint pain      Ofloxacin Nausea and Vomiting    Chlorhexidine Itching    Sulfa Drugs Unspecified       Medications:  Medications Prior to Admission   Medication Sig Dispense Refill Last Dose    azelastine (ASTELIN) 0.1 % nasal spray Spray 1 spray into each nostril 2 times daily. Use in each nostril as directed 1 bottle 3 Not Taking    Cholecalciferol 2000 UNITS CAPS Take 1 capsule by mouth daily.   Taking    digoxin (LANOXIN) 0.125 MG tablet Take 1 tablet (125 mcg) by mouth every evening. 30 tablet 3 Taking    Estradiol (DIVIGEL) 0.25 MG/0.25GM GEL Apply 0.25 mg topically nightly.   Taking    estradiol (ESTRING) 2 MG vaginal ring Insert 1 Ring vaginally Every 3 months. Follow package directions   Taking    ferrous sulfate 324 (65 FE) MG TBEC Take 1 tablet (324 mg) by mouth daily. 30 tablet 0 Taking    FLOLAN 1.5 MG IV SOLR 38HW/EX/HBZ   Taking    FOLIC ACID 169 MCG OR TABS 1 TABLET DAILY   Taking    furosemide (LASIX) 40 MG tablet Take 2.5 tablets (100 mg) by mouth 3 times daily. 200 tablet 3 Taking    medroxyPROGESTERone (PROVERA) 2.5 MG tablet Take 1.25 mg by mouth daily.   Taking    potassium chloride (KLOR-CON) 10 MEQ tablet TAKE 4 TABLETS BY MOUTH DAILY. 120 each 0 Taking    [EXPIRED] sildenafil (REVATIO, VIAGRA) 20 MG tablet  TAKE FOUR TABLETS BY MOUTH THREE TIMES DAILY 360 each 0 Taking    spironolactone (ALDACTONE) 50 MG tablet TAKE 1 TABLET BY MOUTH DAILY. (Patient taking differently: Take 75 mg by mouth.  ) 30 each 0 Taking    vitamin b-12 (CYANOCOBALAMIN) 500 MCG tablet TAKE 1 TABLET BY MOUTH DAILY. 30 each 3 Taking     Physical Exam:  BP 121/63 (BP Location: Left arm, BP Patient Position: Sitting)    Pulse 101    Temp 98.1 F (36.7 C)    Resp 18    Ht 5\' 2"  (1.575 m)    Wt 62.6 kg (138 lb 0.1 oz)    SpO2 98%    BMI 25.24 kg/m   HEENT: NC/AT  General: NAD, stting in chair,  Eyes: Anicetric sclera  Mouth: MMM  CV: RRR  Pulm: Good air entry, CTAB, no crackles   Abd:  Soft, non tender,  distended  Ext: 2 mm edema on both LE. No hip edema. Improving     Labs:  Lab Results   Component Value Date    NA 135 (L) 06/11/2017    K 3.6 06/11/2017  CL 92 (L) 06/11/2017    BICARB 28 06/11/2017    BUN 126 (H) 06/11/2017    CREAT 2.31 (H) 06/11/2017    GLU 89 06/11/2017    [place] 8.6 06/11/2017     Lab Results   Component Value Date    WBC 5.2 06/10/2017    HGB 9.2 (L) 06/10/2017    HCT 29.8 (L) 06/10/2017    PLT 71 (L) 06/10/2017    SEG 66 06/10/2017    BAND 1 06/10/2017    LYMPHS 26 06/10/2017    MONOS 5 06/10/2017    EOS 0 06/10/2017       Assessment and Recommendations:    # AKI on CKD in the setting of PAH/Rt sided HF, responding well to diuretics with Net negative every day but discordant weight and Is/Os.   -Cr stable, one erroneous reading last night  -Responding well to metolazone.   -Would continue metolazone right before lasix dose, continue Lasix 100 mg QID IV and  Reduce Diuril to 500 mg IV once daily.   # Lytes: Hyponatremia continues to improve. K nl.  # Volume: Hypervolemic, continue diuresis, as above would do Lasix 100 mg QID IV, reduce Diuril to 500 once daily and Aldactone 75 mg daily, continue Metolazone 2 mg daily before lasix dose.       # Acid-Base: bicarb rising while diuresing.  # MBD: phos slightly elevated but  improving, continue renal diet and monitor for now.  No need for binders.     The above plan was discussed with the attending, Dr. Duayne Cal, MD  Nephrology Fellow, PGY 4  Division of Nephrology-Hypertension   Pager: [phone]

## 2017-06-12 LAB — BASIC METABOLIC PANEL, BLOOD
Anion Gap: 16 mmol/L — ABNORMAL HIGH (ref 7–15)
Anion Gap: 19 mmol/L — ABNORMAL HIGH (ref 7–15)
BUN: 114 mg/dL — ABNORMAL HIGH (ref 8–23)
BUN: 121 mg/dL — ABNORMAL HIGH (ref 8–23)
Bicarbonate: 27 mmol/L (ref 22–29)
Bicarbonate: 24 mmol/L (ref 22–29)
Calcium: 8.7 mg/dL (ref 8.5–10.6)
Calcium: 8.4 mg/dL — ABNORMAL LOW (ref 8.5–10.6)
Chloride: 91 mmol/L — ABNORMAL LOW (ref 98–107)
Chloride: 91 mmol/L — ABNORMAL LOW (ref 98–107)
Creatinine: 1.24 mg/dL — ABNORMAL HIGH (ref 0.51–0.95)
Creatinine: 1.43 mg/dL — ABNORMAL HIGH (ref 0.51–0.95)
GFR: 43 mL/min
GFR: 37 mL/min
Glucose: 124 mg/dL — ABNORMAL HIGH (ref 70–99)
Glucose: 190 mg/dL — ABNORMAL HIGH (ref 70–99)
Potassium: 3.8 mmol/L (ref 3.5–5.1)
Potassium: 4.3 mmol/L (ref 3.5–5.1)
Sodium: 134 mmol/L — ABNORMAL LOW (ref 136–145)
Sodium: 134 mmol/L — ABNORMAL LOW (ref 136–145)

## 2017-06-12 LAB — PHOSPHORUS, BLOOD: Phosphorous: 5.8 mg/dL — ABNORMAL HIGH (ref 2.7–4.5)

## 2017-06-12 LAB — MAGNESIUM, BLOOD
Magnesium: 2.1 mg/dL (ref 1.6–2.4)
Magnesium: 1.9 mg/dL (ref 1.6–2.4)

## 2017-06-12 NOTE — Progress Notes (Signed)
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 failure on 3Lpm now here with worsening LEx edema, AKI, now on dopamine. Tried lasix gtt, now back to lasix bolus.     INTERVAL:  NAOE, continue to lose weight and diuresing.       MEDICATIONS  IV Drips   DOPamine 2 mcg/kg/min (06/12/17 1028)    epoprostenol (FLOLAN) infusion 35 ng/kg/min (06/12/17 1355)    sodium chloride       Scheduled Medications   furosemide  100 mg 4x Daily    medroxyPROGESTERone  1.25 mg Q24H NR    metolazone  5 mg Q24H    potassium chloride  40 mEq BID    Prostacyclin Cassette Change   Daily    Prostacyclin Tubing   Once per day on Mon Wed Fri    Prostacylin Batteries   Once per day on Mon    sildenafil  80 mg TID    sodium chloride (PF)  3 mL Q8H    spironolactone  75 mg Daily     PRN Medications   acetaminophen  650 mg Q6H PRN 650 mg at 06/04/17 2152    loperamide  2 mg TID PRN 2 mg at 06/12/17 1610    nalOXone  0.1 mg Q2 Min PRN      simethicone  80 mg Q6H PRN 80 mg at 06/09/17 1851    sodium chloride (PF)  3 mL PRN 3 mL at 05/30/17 2016    sodium chloride   Continuous PRN       VITALS   Latest Entry  Range (last 24 hours)    Temperature: 98.6 F (37 C)  Temp  Avg: 98.3 F (36.8 C)  Min: 97.8 F (36.6 C)  Max: 98.6 F (37 C)    Blood pressure (BP): 111/58  BP  Min: 99/53  Max: 138/77    Heart Rate: 96  Pulse  Avg: 100.7  Min: 93  Max: 112    Respirations: 20  Resp  Avg: 19.4  Min: 18  Max: 20    SpO2: 96 %  SpO2  Avg: 97.6 %  Min: 95 %  Max: 100 %       No Data Recorded     Weight: 61.6 kg (135 lb 12.9 oz)  Percentage Weight Change (%): -1.6 %    Intake/Output       06/11/17 0600 - 06/12/17 0559 06/12/17 0600 - 06/13/17 0559      [id number] 3716-9678 Total 0600-1759 9381-0175 Total       Intake    P.O.  600  110 710  1180  -- 1180    I.V.  118  124.9 243  6  -- 6    Total  Intake 718 234.9 953 1186 -- 1186       Output    Urine  2500  2650 5150  2150  200 2350    Total Output 2500 2650 5150 2150 200 2350       Net I/O     -1025 -2415.1 -4197.1 -964 -200 -1164        Physical Exam:  Gen: A&O, conversing comfortably  Eyes: EOMI, pupils equal, anicteric  HENT: Atraumatic, normocephalic  Neck: + JVD, trachea midline  CV: RRR, +murmur, accentuated S2, No  R/G/S3. L tunneled IJ CVC site is CDI  Resp: stable bibasilar crackles, no intercostal retractions/normal effort  Abdo: NT mild D, soft, no masses.  Ext: 2+ BLEx edema, improving  Neuro: No focal weakness or sensory deficit, CN grossly intact  Psych: Appropriate affect and mood, good insight. AnOx3  Skin: Normal temperature, no rash or nodules. Diffuse erythema, likely related to IV Epo; + brusing.     Na 134* (05/31) CL 91* (05/31) BUN 114* (05/31) GLU   124* (05/31)   K 3.8 (05/31) CO2 27 (05/31) Cr 1.24* (05/31)      WBC 5.2 (05/29) HGB 9.2* (05/29) PLT 71* (05/29)    HCT 29.8* (05/29)      TP   ALT   TBILI   ALK PHOS      ALB   AST   DBILI        PT   PTT     INR       No results found for: ARTPH, ARTPCO2, ARTPO2    ASSESSMENT AND PLAN  67 yo F with WHO I PAH associated with SLE and prior anorexogen use admitted with acute on chronic right heart failure and volume overload as well as AKI and thrombocytopenia.Persistent edema.  Minimal progress on exam and weight despite negative 20 L by I/O.    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 PBettey Mare    KChrissie NoaP. CBridgett Larsson MD  Fellow, [facility]monary [facility] Office Phone: [phone] Pager: [phone]   Pulmonary Vascular Attending Addendum:  Patient was examined with Dr. CAngus Seller imaging and tests reviewed.  Please see above note for full details and plan.    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

## 2017-06-12 NOTE — Plan of Care (Signed)
Problem: Promotion of Health and Safety  Goal: Promotion of Health and Safety  The patient remains safe, receives appropriate treatment and achieves optimal outcomes (physically, psychosocially, and spiritually) within the limitations of the disease process by discharge.    Information below is the current care plan.  Outcome: Progressing   06/11/17 2000 06/12/17 0314   Patient /Family stated Goal   Patient /Family stated Goal Get good sleep --    Adult/Peds Plan of Care   Individualized Interventions/Recommendations #1 --  Pt to have improved edema. Administer diuretics per MD orders, monitor strict I/O's, daily weights, and fluid restrictions.   Individualized Interventions/Recommendations #2 (if applicable) --  Pt to have no respiratory distress. Assess and monitor respiratory status, provide 02 PRN   Individualized Interventions/Recommendations #3 (if applicable) --  Cluster care and limit interruptions to promote uninterrupted sleep.    Outcome Evaluation (rationale for progressing/not progressing) every shift --  Edema has improved, diuretics administered and I/O's & daily weight charted. Pt without respiratory distress, remains on 3L 02 via NC. Pt is sleeping comfortably in NAD.

## 2017-06-12 NOTE — Progress Notes (Signed)
NEPHROLOGY INPATIENT PROGRESS NOTE    Diana Chambers is a 67 year old female with hx of Group 1 PAH associated with SLE and /Sjogren's disease, presented to the hospital with worsening legs swelling. 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        Events:   UOP more 4.3 lit ---> Net neg 2.6 lit  Lost 2 lbs  Hemodynamically stable.   Continues on the IV lasix, diuril and metolazone.   Lost 3lbs since yesterday    Subjective/ROS  Legs swelling is much better, no chest pain or SOB    Allergies:  Allergies   Allergen Reactions    Allopurinol Hives    Levaquin Swelling and Other     Severe joint pain      Ofloxacin Nausea and Vomiting    Chlorhexidine Itching    Sulfa Drugs Unspecified       Medications:  Medications Prior to Admission   Medication Sig Dispense Refill Last Dose    azelastine (ASTELIN) 0.1 % nasal spray Spray 1 spray into each nostril 2 times daily. Use in each nostril as directed 1 bottle 3 Not Taking    Cholecalciferol 2000 UNITS CAPS Take 1 capsule by mouth daily.   Taking    digoxin (LANOXIN) 0.125 MG tablet Take 1 tablet (125 mcg) by mouth every evening. 30 tablet 3 Taking    Estradiol (DIVIGEL) 0.25 MG/0.25GM GEL Apply 0.25 mg topically nightly.   Taking    estradiol (ESTRING) 2 MG vaginal ring Insert 1 Ring vaginally Every 3 months. Follow package directions   Taking    ferrous sulfate 324 (65 FE) MG TBEC Take 1 tablet (324 mg) by mouth daily. 30 tablet 0 Taking    FLOLAN 1.5 MG IV SOLR 71IR/CV/ELF   Taking    FOLIC ACID 810 MCG OR TABS 1 TABLET DAILY   Taking    furosemide (LASIX) 40 MG tablet Take 2.5 tablets (100 mg) by mouth 3 times daily. 200 tablet 3 Taking    medroxyPROGESTERone (PROVERA) 2.5 MG tablet Take 1.25 mg by mouth daily.   Taking    potassium chloride (KLOR-CON) 10 MEQ tablet TAKE 4 TABLETS BY MOUTH DAILY. 120 each 0 Taking    [EXPIRED] sildenafil  (REVATIO, VIAGRA) 20 MG tablet TAKE FOUR TABLETS BY MOUTH THREE TIMES DAILY 360 each 0 Taking    spironolactone (ALDACTONE) 50 MG tablet TAKE 1 TABLET BY MOUTH DAILY. (Patient taking differently: Take 75 mg by mouth.  ) 30 each 0 Taking    vitamin b-12 (CYANOCOBALAMIN) 500 MCG tablet TAKE 1 TABLET BY MOUTH DAILY. 30 each 3 Taking     Physical Exam:  BP 128/58 (BP Location: Right arm, BP Patient Position: Sitting)    Pulse 98    Temp 98.9 F (37.2 C)    Resp 20    Ht 5\' 2"  (1.575 m)    Wt 61.6 kg (135 lb 12.9 oz)    SpO2 100%    BMI 24.84 kg/m   HEENT: NC/AT  General: NAD, stting in chair,  Eyes: Anicetric sclera  Mouth: MMM  CV: RRR  Pulm: Good air entry, CTAB, no crackles   Abd:  Soft, non tender,  distended  Ext: 2 mm edema on both LE. No hip edema. Improving     Labs:  Lab Results   Component Value Date    NA 134 (L) 06/12/2017  K 4.3 06/12/2017    CL 91 (L) 06/12/2017    BICARB 24 06/12/2017    BUN 121 (H) 06/12/2017    CREAT 1.43 (H) 06/12/2017    GLU 190 (H) 06/12/2017    [place] 8.4 (L) 06/12/2017     No results found for: WBC, HGB, HCT, PLT, SEG, BAND, LYMPHS, MONOS, EOS, NRBC    Assessment and Recommendations:    # AKI on CKD in the setting of PAH/Rt sided HF, responding well to diuretics  -Losing 2-3 lbs every day.   -Cr improved today  -Since good UOP response would like to try taking off the diuril and continue metolazone with same dose Lasix IV and aldactone and monitor UOP.    # Lytes: Hyponatremia continues to improve. K nl.  # Volume: Hypervolemic, continue diuresis, as above, discontinue diuril and  Continue Lasix 100 mg QID IV and Metolazone 5 mg daily    # Acid-Base: bicarb rising while diuresing but stable.  # MBD: phos slightly elevated but improving, continue renal diet and monitor for now.  No need for binders.     The above plan was discussed with the attending, Dr. Duayne Cal, MD  Nephrology Fellow, PGY 4  Division of Nephrology-Hypertension   Pager: [phone]

## 2017-06-12 NOTE — Plan of Care (Signed)
Problem: Promotion of Health and Safety  Goal: Promotion of Health and Safety  The patient remains safe, receives appropriate treatment and achieves optimal outcomes (physically, psychosocially, and spiritually) within the limitations of the disease process by discharge.    Information below is the current care plan.  Outcome: Progressing   06/12/17 0314 06/12/17 2000   Patient /Family stated Goal   Patient /Family stated Goal --  Keep diuresing   Adult/Peds Plan of Care   Individualized Interventions/Recommendations #1 Pt to have improved edema. Administer diuretics per MD orders, monitor strict I/O's, daily weights, and fluid restrictions. --    Individualized Interventions/Recommendations #2 (if applicable) Pt to have no respiratory distress. Assess and monitor respiratory status, provide 02 PRN --    Individualized Interventions/Recommendations #3 (if applicable) Cluster care and limit interruptions to promote uninterrupted sleep.  --    Outcome Evaluation (rationale for progressing/not progressing) every shift Edema has improved, diuretics administered and I/O's & daily weight charted. Pt without respiratory distress, remains on 3L 02 via NC. Pt is sleeping comfortably in NAD. --

## 2017-06-13 DIAGNOSIS — I509 Heart failure, unspecified: Secondary | ICD-10-CM

## 2017-06-13 LAB — BASIC METABOLIC PANEL, BLOOD
Anion Gap: 17 mmol/L — ABNORMAL HIGH (ref 7–15)
Anion Gap: 18 mmol/L — ABNORMAL HIGH (ref 7–15)
BUN: 123 mg/dL — ABNORMAL HIGH (ref 8–23)
BUN: 126 mg/dL — ABNORMAL HIGH (ref 8–23)
Bicarbonate: 27 mmol/L (ref 22–29)
Bicarbonate: 27 mmol/L (ref 22–29)
Calcium: 8.7 mg/dL (ref 8.5–10.6)
Calcium: 8.7 mg/dL (ref 8.5–10.6)
Chloride: 92 mmol/L — ABNORMAL LOW (ref 98–107)
Chloride: 92 mmol/L — ABNORMAL LOW (ref 98–107)
Creatinine: 2.18 mg/dL — ABNORMAL HIGH (ref 0.51–0.95)
Creatinine: 2.41 mg/dL — ABNORMAL HIGH (ref 0.51–0.95)
GFR: 22 mL/min
GFR: 20 mL/min
Glucose: 93 mg/dL (ref 70–99)
Glucose: 115 mg/dL — ABNORMAL HIGH (ref 70–99)
Potassium: 4 mmol/L (ref 3.5–5.1)
Potassium: 4.1 mmol/L (ref 3.5–5.1)
Sodium: 136 mmol/L (ref 136–145)
Sodium: 137 mmol/L (ref 136–145)

## 2017-06-13 LAB — MAGNESIUM, BLOOD
Magnesium: 2.1 mg/dL (ref 1.6–2.4)
Magnesium: 2 mg/dL (ref 1.6–2.4)

## 2017-06-13 LAB — PHOSPHORUS, BLOOD: Phosphorous: 6.1 mg/dL — ABNORMAL HIGH (ref 2.7–4.5)

## 2017-06-13 MED ORDER — SEVELAMER CARBONATE 800 MG OR TABS
800.00 mg | ORAL_TABLET | Freq: Three times a day (TID) | ORAL | Status: DC
Start: 2017-06-13 — End: 2017-06-21
  Administered 2017-06-13 – 2017-06-21 (×23): 800 mg via ORAL
  Filled 2017-06-13 (×23): qty 1

## 2017-06-13 NOTE — Plan of Care (Signed)
Problem: Promotion of Health and Safety  Goal: Promotion of Health and Safety  Description  The patient remains safe, receives appropriate treatment and achieves optimal outcomes (physically, psychosocially, and spiritually) within the limitations of the disease process by discharge.    Information below is the current care plan.  Outcome: Progressing  Flowsheets  Taken 06/13/2017 2332 by Atlee Abide, RN  Patient /Family stated Goal: Sleep well tonight  Individualized Interventions/Recommendations #1: Monitor intake and output  Individualized Interventions/Recommendations #2 (if applicable): Monitor for resp. distress  Individualized Interventions/Recommendations #3 (if applicable): Educate on pt safety and fall precaution  Individualized Interventions/Recommendations #4 (if applicable): Monitor VS  Outcome Evaluation (rationale for progressing/not progressing) every shift: Pt remained safe, educated on using the call light prior to exiting bed. VSS, no resp distress noted; continue to monitor. Continue to monitor intake and output.  Taken 06/11/2017 0542 by Scheryl Darter, RN  Guidelines: Inpatient Nursing Guidelines

## 2017-06-13 NOTE — Progress Notes (Signed)
NEPHROLOGY INPATIENT PROGRESS NOTE    Diana Chambers is a 67 year old female with hx of Group 1 PAH associated with SLE and /Sjogren's disease, presented to the hospital with worsening legs swelling. 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        Events:   UOP declined for the 2nd shift after stopping diuril.   Net Neg 2 lit, no weight checked today    Subjective/ROS  Legs swelling is much better, no chest pain or SOB    Allergies:  Allergies   Allergen Reactions    Allopurinol Hives    Levaquin Swelling and Other     Severe joint pain      Ofloxacin Nausea and Vomiting    Chlorhexidine Itching    Sulfa Drugs Unspecified       Medications:  Medications Prior to Admission   Medication Sig Dispense Refill Last Dose    azelastine (ASTELIN) 0.1 % nasal spray Spray 1 spray into each nostril 2 times daily. Use in each nostril as directed 1 bottle 3 Not Taking    Cholecalciferol 2000 UNITS CAPS Take 1 capsule by mouth daily.   Taking    digoxin (LANOXIN) 0.125 MG tablet Take 1 tablet (125 mcg) by mouth every evening. 30 tablet 3 Taking    Estradiol (DIVIGEL) 0.25 MG/0.25GM GEL Apply 0.25 mg topically nightly.   Taking    estradiol (ESTRING) 2 MG vaginal ring Insert 1 Ring vaginally Every 3 months. Follow package directions   Taking    ferrous sulfate 324 (65 FE) MG TBEC Take 1 tablet (324 mg) by mouth daily. 30 tablet 0 Taking    FLOLAN 1.5 MG IV SOLR 80DX/IP/JAS   Taking    FOLIC ACID 505 MCG OR TABS 1 TABLET DAILY   Taking    furosemide (LASIX) 40 MG tablet Take 2.5 tablets (100 mg) by mouth 3 times daily. 200 tablet 3 Taking    medroxyPROGESTERone (PROVERA) 2.5 MG tablet Take 1.25 mg by mouth daily.   Taking    potassium chloride (KLOR-CON) 10 MEQ tablet TAKE 4 TABLETS BY MOUTH DAILY. 120 each 0 Taking    [EXPIRED] sildenafil (REVATIO, VIAGRA) 20 MG tablet TAKE FOUR TABLETS BY MOUTH THREE  TIMES DAILY 360 each 0 Taking    spironolactone (ALDACTONE) 50 MG tablet TAKE 1 TABLET BY MOUTH DAILY. (Patient taking differently: Take 75 mg by mouth.  ) 30 each 0 Taking    vitamin b-12 (CYANOCOBALAMIN) 500 MCG tablet TAKE 1 TABLET BY MOUTH DAILY. 30 each 3 Taking     Physical Exam:  BP 119/57 (BP Location: Right arm, BP Patient Position: Sitting)    Pulse 91    Temp 98.6 F (37 C)    Resp 16    Ht 5\' 2"  (1.575 m)    Wt 61.6 kg (135 lb 12.9 oz)    SpO2 97%    BMI 24.84 kg/m   HEENT: NC/AT  General: NAD, stting in chair,  Eyes: Anicetric sclera  Mouth: MMM  CV: RRR  Pulm: Good air entry, CTAB, no crackles   Abd:  Soft, non tender,  distended  Ext: 2 mm edema on both LE. No hip edema. Improving     Labs:  Lab Results   Component Value Date    NA 136 06/13/2017    K 4.0 06/13/2017    CL 92 (L) 06/13/2017    BICARB  27 06/13/2017    BUN 123 (H) 06/13/2017    CREAT 2.18 (H) 06/13/2017    GLU 93 06/13/2017    [place] 8.7 06/13/2017     No results found for: WBC, HGB, HCT, PLT, SEG, BAND, LYMPHS, MONOS, EOS, NRBC    Assessment and Recommendations:    # AKI on CKD in the setting of PAH/Rt sided HF, responding well to diuretics now, UOP slightly declined after stopping Diuril for 2nd shift, will CTM for now and no changes to diuretics regimen are suggested today.     -Losing 2-3 lbs every day.   -Cr stable but tends to fluctuate.   -Diuretics as below.     # Lytes: Hyponatremia normalized now while diuresing.  # Volume: Hypervolemic but improving.  Would continue same diuresis regimen today with Lasix 100 mg QID IV and Metolazone 5 mg daily and Aldactone 75 mg daily  # Acid-Base: bicarb rising while diuresing, CTM  # MBD: phos elevated, will start Renvela 800 mg TID WM for now.   # Anemia: please check iron stores and if replete would start EPO 10K weekly.      The above plan was discussed with the attending, Dr. Duayne Cal, MD  Nephrology Fellow, PGY 4  Division of Nephrology-Hypertension   Pager:  [phone]

## 2017-06-13 NOTE — Progress Notes (Signed)
Pulm Vasc Attending Attending: concerned about decreased urine output this am, but feels abdomen smaller and less LE edema. Weight down 0.5 Kg and I/O -2L. BP 117/55 (BP Location: Right arm, BP Patient Position: Sitting)    Pulse 102    Temp 99.1 F (37.3 C)    Resp 20    Ht 5\' 2"  (1.575 m)    Wt 61.6 kg (135 lb 12.9 oz)    SpO2 96%    BMI 24.84 kg/m   Flolan rash. Bibas crackles. RRR with loud P2 and course systolic murmur, no gallop. Abdomen with significant ascites, nontender. 1+ LE edema    Cr 2.18 (lower yesterday, but probably outliers)    1. WHO I PAH associated with SLE and prior anorexigen use admitted with acute on chronic right heart failure, volume overload, AKI and thrombocytopenia, chronic hypoxemia on supplemental oxygen    - continue lasix 100 mg qd day  - continue metolazone 5 mg daily  - continue spirinolactone  - continue dopamine 2 mcg/kg/min  - continue flolan 35 ng/kg/min and sildenafil  - monitor I/o and daily weights  - titrating oxygen to keep SpO2 > 92%  - check BMP and cbc tomorrow    [name], MD

## 2017-06-14 LAB — IBC - IRON BINDING CAPACITY
Iron Saturation: 16 %
Iron: 33 ug/dL — ABNORMAL LOW (ref 37–145)
Total IBC: 211 ug/dL (ref 148–506)
UIBC: 178 ug/dL (ref 112–346)

## 2017-06-14 LAB — BASIC METABOLIC PANEL, BLOOD
Anion Gap: 15 mmol/L (ref 7–15)
BUN: 117 mg/dL — ABNORMAL HIGH (ref 8–23)
Bicarbonate: 29 mmol/L (ref 22–29)
Calcium: 8.8 mg/dL (ref 8.5–10.6)
Chloride: 92 mmol/L — ABNORMAL LOW (ref 98–107)
Creatinine: 2.2 mg/dL — ABNORMAL HIGH (ref 0.51–0.95)
GFR: 22 mL/min
Glucose: 98 mg/dL (ref 70–99)
Potassium: 3.8 mmol/L (ref 3.5–5.1)
Sodium: 136 mmol/L (ref 136–145)

## 2017-06-14 LAB — CBC WITH DIFF, BLOOD
ANC-Manual Mode: 3.1 10*3/uL (ref 1.6–7.0)
Abs Basophils: 0 10*3/uL (ref <0.1)
Abs Eosinophils: 0 10*3/uL (ref 0.1–0.5)
Abs Lymphs: 0.7 10*3/uL — ABNORMAL LOW (ref 0.8–3.1)
Abs Monos: 0.2 10*3/uL (ref 0.2–0.8)
Basophils: 1 %
Eosinophils: 0 %
Hct: 25.8 % — ABNORMAL LOW (ref 34.0–45.0)
Hgb: 8.1 gm/dL — ABNORMAL LOW (ref 11.2–15.7)
Lymphocytes: 17 %
MCH: 25.6 pg — ABNORMAL LOW (ref 26.0–32.0)
MCHC: 31.4 g/dL — ABNORMAL LOW (ref 32.0–36.0)
MCV: 81.4 um3 (ref 79.0–95.0)
Monocytes: 4 %
Plt Count: 40 10*3/uL — ABNORMAL LOW (ref 140–370)
RBC: 3.17 10*6/uL — ABNORMAL LOW (ref 3.90–5.20)
RDW: 18.6 % — ABNORMAL HIGH (ref 12.0–14.0)
Segs: 74 %
WBC: 4.2 10*3/uL (ref 4.0–10.0)

## 2017-06-14 LAB — VITAMIN B12, BLOOD: Vitamin B12: 243 pg/mL (ref 232–1245)

## 2017-06-14 LAB — MDIFF
Immature Granulocytes Absolute Manual: 0.2 10*3/uL — ABNORMAL HIGH (ref 0.0–0.1)
Metamyelocytes: 2 %
Myelocytes: 2 %
Number of Cells Counted: 114
Plt Est: DECREASED

## 2017-06-14 LAB — FERRITIN, BLOOD: Ferritin: 73 ng/mL (ref 13–150)

## 2017-06-14 LAB — MAGNESIUM, BLOOD: Magnesium: 2 mg/dL (ref 1.6–2.4)

## 2017-06-14 LAB — PHOSPHORUS, BLOOD: Phosphorous: 5.9 mg/dL — ABNORMAL HIGH (ref 2.7–4.5)

## 2017-06-14 LAB — RBC FOLATE, BLOOD
Hct: 25.8
RBC Folate: >1240 ng/mL — ABNORMAL HIGH (ref 209–640)

## 2017-06-14 MED ORDER — METOLAZONE 2.5 MG OR TABS
2.50 mg | ORAL_TABLET | ORAL | Status: DC
Start: 2017-06-14 — End: 2017-06-16
  Administered 2017-06-14 – 2017-06-15 (×2): 2.5 mg via ORAL
  Filled 2017-06-14 (×2): qty 1

## 2017-06-14 NOTE — Interdisciplinary (Signed)
06/14/17 1532   Follow Up/Progress   Is the Patient Ready for Discharge No   Supplies/Services  Too soon to be determined   Barriers to Discharge Awaiting clinical improvement   Patient/Family/Legal/Surrogate Decision Maker Has Been Given Options And Choice In The Selection of Post-Acute Care Providers Not Applicable  (Pending d/c needs)   Family/Caregiver's Assessed for Readiness, willingness, and ability to provide or support self-management activities;Readiness to provide care to the patient   Respite Care Not Applicable   Patient/Family Are In Agreement With Discharge Plan Other (Comment)  (Pending d/c needs)   Public Health Clearance Needed No   Plan/Interventions Confirm return to prior placement;Explore needs and options for aftercare, provide referrals;Ongoing weekly therapeutic interventions     Per Pulmonary Vascular care team, pt unstable for d/c.  She remains on 3L O2 NC, IV Lasix QID, and Dopamine/Flolan gtts.  Medication and care interventions continue.    Barriers to d/c: clinical improvement-wean off dopamine gtt.  Transportation: family.  DCP: TBD-Home vs. Home w/HH.  Pt and care team aware and agree to further discuss d/c plan pending clinical improvement and care team recommendations.  Incoming CM to continue to follow for d/c needs.      Ria Clock MSN, RN, CNL, [facility]  Inpatient Care Manager  [phone]

## 2017-06-14 NOTE — Progress Notes (Signed)
NEPHROLOGY PROGRESS NOTE    Chief Complaint: Edema (C/O SWELLING TO BOTH FEET FOR THE LAST 5 DAYS.  C/O ABD BLOATING.  DC'D FROM A HOSP IN [place] FOR DIARRHEA.  TAKEN OFF LASIX FOR THE LAST 5 DAYS.  DROVE Diana Chambers.  PT IS A FLOLAN PT.)  Reason for Consultation: AKI evaluation  Hospital Day #30 Days 8 Hours  PrimaryTeam/ Attending Provider: Larna Chambers., MD    ID/ Assessment:   Diana Chambers is a 67 year old female w/ PMH of hx of Group 1 PAH associated with SLE and /Sjogren's disease, presented to the hospital with worsening legs swelling. 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   _____________________________________________________________________________    Plan (Today's recommendations in bold):  # AKI on CKD in the setting of PAH/Rt sided HF, responding well to diuretics now but still with discordant Is/Os and weight. UOP initially declined after stopping diuril but increased again yesterday  -Cr continues to fluctuate, overall stable.   -Estimated dry weight ~129 lb, possibly lower d/t prolonged hospitalization and loss of muscle weight.  -Defer to pulmonary team regarding transitioning lasix from IV to PO, but possibly soon since close to dry weight.  -06/02 explained in detail the CKD stage and her risk of progression to ESRD- pt mentioned she wouldn't want dialysis if it got to that.    # Lytes: Hyponatremia improved while diuresing. K at goal.   # Volume: Hypervolemic, improved significantly.  Would continue same diuresis regimen today with Lasix 100 mg QID IV(Reported hx of Bumex intolerance in the past) and Metolazone 5 mg daily and Aldactone 75 mg daily.   # Acid-Base: bicarb trended up while diuresing but stable, CTM  # MBD: phos elevated- Renvela 800 mg TID WM + recommend renal diet.  # Anemia: Iron studies with low iron sat ~ 16, recommend starting Iron sucrose 200 mg IV QOD x5  doses then start Epogen once replete.   # Dispo: f/u on discharge w/ Dr. Donnelly Stager in renal clinic.    Case seen and discussed with Dr. Cindie Laroche.  See attending addendum for changes in above plan.    Bonnita Levan, MD  Nephrology Fellow, PGY 4  Division of Nephrology-Hypertension   [place] [facility] [place], [place] [facility]  [address] [place], [place] [address]  Pager: [phone]  _____________________________________________________________________________    Interval Events: Diana Chambers- PVM/primary- decreased metolazone, continue dopamine to assist w/ diuresis.   Subjective: Denies CP, SOB. Overall swelling is better, abdominal swelling is a bit better. Feels overall about the same.  Review of Systems: A full ROS performed and negative outside above subjective history.  _____________________________________________________________________________    Physical Exam:  General: NAD, stting in chair,  HEENT: NC/AT  Eyes: Anicetric sclera  Mouth: MMM  CV: RRR  Pulm: Good air entry, CTAB, no crackles   Abd:  Soft, non tender,  distended  GU: No foley  Ext: Improving 2 mm edema on both LE. No hip edema. Rt> Lt. Improving.    24hour Vital Signs:  Temperature:  [98.1 F (36.7 C)-99.1 F (37.3 C)] 98.5 F (36.9 C) (06/03 0728)  Blood pressure (BP): (97-124)/(55-65) 118/62 (06/03 0728)  Heart Rate:  [95-113] 99 (06/03 0728)  Respirations:  [18-20] 18 (06/03 0728)  Pain Score: 0 (06/03 0728)  O2 Device: Nasal cannula (06/03 0728)  O2 Flow Rate (L/min):  [  3 l/min] 3 l/min (06/03 0728)  SpO2:  [97 %-100 %] 98 % (06/03 0728)    Most Recent Vital Signs:   06/14/17  2102 06/14/17  2330 06/15/17  0503 06/15/17  0728   BP: 124/58 97/55 103/61 118/62   Pulse:  104 98 99   Resp:  20 18 18    Temp:  99.1 F (37.3 C) 98.8 F (37.1 C) 98.5 F (36.9 C)   SpO2:  100% 97% 98%     Intake/Output:  06/02 0600 - 06/03 0559  In: 1318.4 [P.O.:1210; I.V.:108.4]  Out: 2200 [Urine:2200]  Net: -881.6  06/14/2017 0600 - 06/15/2017 0600  Urine  x4  Stool x2  Emesis x0    Wt Readings from Last 3 Encounters:   06/15/17 61.1 kg (134 lb 11.2 oz)   01/21/17 58.9 kg (129 lb 14.4 oz)   10/15/16 67.1 kg (148 lb)     Diet Regular; 1561ml Volume Restriction  _____________________________________________________________________________    Labs reviewed; significant for:  Recent Labs     06/13/17  0545 06/13/17  1650 06/14/17  0549   NA 136 137 136   K 4.0 4.1 3.8   CL 92* 92* 92*   BICARB 27 27 29    BUN 123* 126* 117*   CREAT 2.18* 2.41* 2.20*   GFRNON 22 20 22    GLU 93 115* 98   ANION 17* 18* 15   [place] 8.7 8.7 8.8   MG 2.1 2.0 2.0   PHOS 6.1*  --  5.9*     Recent Labs     06/14/17  0549 06/15/17  0627   WBC 4.2 4.2   HGB 8.1* 8.1*   HCT 25.8* 26.2*   PLT 40* 43*     Fe 33* (06/02) TIBC 211 (06/02) Fe Sat 16 (06/02) FERRITIN 73 (06/02).  _____________________________________________________________________________  Microbiology  Lab Results   Component Value Date    BCRES No Growth 05/16/2017     Lab Results   Component Value Date    URINECULTURE (A) 06/11/2016     Staphylococcus aureus  >100,000 colonies/mL  Two colony types; same susceptibility pattern  Identification performed by [employer] [facility]( [facility]). This test  was developed and its performance characteristics determined by [facility]. It has not been cleared or  approved by the U.S. Food and Drug Administration.  The FDA has  determined that such clearance or approval is not necessary.       Lab Results   Component Value Date    GSRES  02/22/2013     Rare Gram positive cocci  Rare yeast  (Performed at [facility]       Lab Results   Component Value Date    CXRES Moderate Normal Respiratory Flora (A) 06/15/2011    CXRES Moraxella (Branhamella) catarrhalis  Heavy 06/15/2011    CXRES Streptococcus group C  Rare 06/15/2011     _____________________________________________________________________________  Medications reviewed   furosemide  100 mg 4x Daily    medroxyPROGESTERone   1.25 mg Q24H NR    metolazone  2.5 mg Q24H    potassium chloride  40 mEq BID    Prostacyclin Cassette Change   Daily    Prostacyclin Tubing   Once per day on Mon Wed Fri    Prostacylin Batteries   Once per day on Mon    sevelamer carbonate  800 mg TID WC    sildenafil  80 mg TID    sodium chloride (PF)  3 mL Q8H    spironolactone  75 mg Daily      DOPamine 2 mcg/kg/min (06/14/17 1941)    epoprostenol (FLOLAN) infusion 35 ng/kg/min (06/14/17 1941)    sodium chloride        acetaminophen  650 mg Q6H PRN    loperamide  2 mg TID PRN    nalOXone  0.1 mg Q2 Min PRN    simethicone  80 mg Q6H PRN    sodium chloride (PF)  3 mL PRN    sodium chloride   Continuous PRN     _____________________________________________________________________________  Imaging reviewed  X-Ray Chest Single View  Narrative: EXAM DESCRIPTION:  X-RAY CHEST SINGLE VIEW    CLINICAL HISTORY:  Pulmonary hypertension    COMPARISON:  08/11/2016    TECHNIQUE:  Frontal chest radiograph.    FINDINGS:  Lungs are well expanded. Bilateral perihilar vascular prominence which may be related to sequela of pulmonary hypertension. Possible component of mild interstitial pulmonary edema. Mild blunting the right lateral costophrenic sulcus. No pneumothorax. Normal trachea. Enlargement of the central pulmonary arteries. Increased size of the cardiac silhouette from prior. Aortic calcifications. Normal mediastinal contour. No acute osseous abnormality. Central venous catheter with tip in the proximal right atrium.             Signed by: Rolly Salter 05/16/2017 11:17:07  Impression: IMPRESSION:  Enlargement of the cardiac silhouette from the prior chest radiograph which may related to cardiac chamber enlargement/increased intravascular volume but pericardial effusion cannot be excluded.    Sequela of portal hypertension. Query mild interstitial pulmonary edema and small right pleural  effusion.    _____________________________________________________________________________  Allergies  Allergies   Allergen Reactions    Allopurinol Hives    Levaquin Swelling and Other     Severe joint pain      Ofloxacin Nausea and Vomiting    Chlorhexidine Itching    Sulfa Drugs Unspecified     _____________________________________________________________________________

## 2017-06-14 NOTE — Progress Notes (Signed)
Diana Chambers NOTE     Current Hospital Stay:   29 days - Admitted on: 05/15/2017    ID: 67 year old F with 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 failure on 3Lpm now here with worsening LEx edema, AKI, now on dopamine. Tried lasix gtt, now back to lasix bolus.     INTERVAL:  Diuresing well. Slowly loosing her weight. Walking around the unit with husband. Improved  Kidney function is stable    MEDICATIONS  IV Drips   DOPamine 2 mcg/kg/min (06/14/17 0720)    epoprostenol (FLOLAN) infusion 35 ng/kg/min (06/14/17 0700)    sodium chloride       Scheduled Medications   furosemide  100 mg 4x Daily    medroxyPROGESTERone  1.25 mg Q24H NR    metolazone  2.5 mg Q24H    potassium chloride  40 mEq BID    Prostacyclin Cassette Change   Daily    Prostacyclin Tubing   Once per day on Mon Wed Fri    Prostacylin Batteries   Once per day on Mon    sevelamer carbonate  800 mg TID WC    sildenafil  80 mg TID    sodium chloride (PF)  3 mL Q8H    spironolactone  75 mg Daily     PRN Medications   acetaminophen  650 mg Q6H PRN 650 mg at 06/04/17 2152    loperamide  2 mg TID PRN 2 mg at 06/14/17 1204    nalOXone  0.1 mg Q2 Min PRN      simethicone  80 mg Q6H PRN 80 mg at 06/09/17 1851    sodium chloride (PF)  3 mL PRN 3 mL at 05/30/17 2016    sodium chloride   Continuous PRN       VITALS   Latest Entry  Range (last 24 hours)    Temperature: 98.1 F (36.7 C)  Temp  Avg: 98.3 F (36.8 C)  Min: 97.8 F (36.6 C)  Max: 98.6 F (37 C)    Blood pressure (BP): 118/64  BP  Min: 99/53  Max: 138/77    Heart Rate: 95  Pulse  Avg: 100.7  Min: 93  Max: 112    Respirations: 19  Resp  Avg: 19.4  Min: 18  Max: 20    SpO2: 99 %  SpO2  Avg: 97.6 %  Min: 95 %  Max: 100 %       No Data Recorded     Weight: 61.1 kg (134 lb 11.2 oz)  Percentage Weight Change (%): -0.81 %    Intake/Output       06/13/17 0600 - 06/14/17 0559 06/14/17 0600 - 06/15/17 0559      [id number] 1800-0559 Total 0600-1759  1800-0559 Total       Intake    P.O.  520  300 820  --  -- --    I.V.  338.2  102.7 440.9  --  -- --    Total Intake 858.2 402.7 1260.9 -- -- --       Output    Urine  1850  1700 3550  400  -- 400    Stool  3  -- 3  --  -- --    Total Output 1853 1700 3553 400 -- 400       Net I/O     -994.8 -1297.3 -2292.1 -400 -- -400  Physical Exam:  Gen: A&O, conversing comfortably  Eyes: EOMI, pupils equal, anicteric  HENT: Atraumatic, normocephalic  Neck: + JVD, trachea midline  CV: RRR, +murmur, accentuated S2, No R/G/S3. L tunneled IJ CVC site is CDI  Resp: stable bibasilar crackles, no intercostal retractions/normal effort  Abdo: NT mild D, soft, no masses.  Ext: 2+ BLEx edema, improving  Neuro: No focal weakness or sensory deficit, CN grossly intact  Psych: Appropriate affect and mood, good insight. AnOx3  Skin: Normal temperature, no rash or nodules. Diffuse erythema, likely related to IV Epo; + brusing.     Na 136 (06/02) CL 92* (06/02) BUN 117* (06/02) GLU   98 (06/02)   K 3.8 (06/02) CO2 29 (06/02) Cr 2.20* (06/02)      WBC 4.2 (06/02) HGB 8.1* (06/02) PLT 40* (06/02)    HCT 25.8* (06/02)      TP   ALT   TBILI   ALK PHOS      ALB   AST   DBILI        PT   PTT     INR       No results found for: ARTPH, ARTPCO2, ARTPO2    ASSESSMENT AND PLAN  67 yo F with WHO I PAH associated with SLE and prior anorexogen use admitted with acute on chronic right heart failure and volume overload as well as AKI and thrombocytopenia.Persistent edema.  Minimal progress on exam and weight despite negative 20 L by I/O.    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 Initiate K binder    This patient was seen and discussed with my attending physician, Dr. Buddy Chambers. This note is not final until co-signed/ attested/ addended by attending physician.     Diana Scot, MD  Fellow, [place]    PTE Attending: Seen and examined with Dr. Gerrit Chambers: agree with his history, PE, assessment and plan. 67 y.o. Female with Group I PAH associated with SLE and prior anorexigen used hosptilaized for acute on chrnic right heart failure, volume overload, AKI, chronic hypoxemia on supplemental oxygen and chronic anemia and thrombocytopenia. Hgb has drifted down, likely partially related to blood draws - will change to q day BMP. Her repeat cbc today also shows platelts are decreased to 40K , but no clinical evidence of bleeding. She continue to diurese nicely on the above regimen, today we decreased her metolazone to 2.5 mg, keeping the rest o her regimen the same. Phosphate binder suggester 9as per nephrology) and we will check iron stores (may benefit from epo).    Spent 35 minutes in evaluation, management of above issues with > 50% time counseling patient    Diana Mu, MD

## 2017-06-14 NOTE — Progress Notes (Signed)
NEPHROLOGY INPATIENT PROGRESS NOTE    Diana Chambers is a 67 year old female with hx of Group 1 PAH associated with SLE and /Sjogren's disease, presented to the hospital with worsening legs swelling. 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        Events:   UOP good off diuril --> 3.5 lit --> Net ne 2.2 lit  Weight only down by 1 lb, Is/Os and weight still dicordant.     Subjective/ROS  Feels better, legs swelling is much better. No SOB or chest pain.       Allergies:  Allergies   Allergen Reactions    Allopurinol Hives    Levaquin Swelling and Other     Severe joint pain      Ofloxacin Nausea and Vomiting    Chlorhexidine Itching    Sulfa Drugs Unspecified       Medications:  Medications Prior to Admission   Medication Sig Dispense Refill Last Dose    azelastine (ASTELIN) 0.1 % nasal spray Spray 1 spray into each nostril 2 times daily. Use in each nostril as directed 1 bottle 3 Not Taking    Cholecalciferol 2000 UNITS CAPS Take 1 capsule by mouth daily.   Taking    digoxin (LANOXIN) 0.125 MG tablet Take 1 tablet (125 mcg) by mouth every evening. 30 tablet 3 Taking    Estradiol (DIVIGEL) 0.25 MG/0.25GM GEL Apply 0.25 mg topically nightly.   Taking    estradiol (ESTRING) 2 MG vaginal ring Insert 1 Ring vaginally Every 3 months. Follow package directions   Taking    ferrous sulfate 324 (65 FE) MG TBEC Take 1 tablet (324 mg) by mouth daily. 30 tablet 0 Taking    FLOLAN 1.5 MG IV SOLR 66YQ/IH/KVQ   Taking    FOLIC ACID 259 MCG OR TABS 1 TABLET DAILY   Taking    furosemide (LASIX) 40 MG tablet Take 2.5 tablets (100 mg) by mouth 3 times daily. 200 tablet 3 Taking    medroxyPROGESTERone (PROVERA) 2.5 MG tablet Take 1.25 mg by mouth daily.   Taking    potassium chloride (KLOR-CON) 10 MEQ tablet TAKE 4 TABLETS BY MOUTH DAILY. 120 each 0 Taking    [EXPIRED] sildenafil (REVATIO, VIAGRA) 20 MG tablet  TAKE FOUR TABLETS BY MOUTH THREE TIMES DAILY 360 each 0 Taking    spironolactone (ALDACTONE) 50 MG tablet TAKE 1 TABLET BY MOUTH DAILY. (Patient taking differently: Take 75 mg by mouth.  ) 30 each 0 Taking    vitamin b-12 (CYANOCOBALAMIN) 500 MCG tablet TAKE 1 TABLET BY MOUTH DAILY. 30 each 3 Taking     Physical Exam:  BP 118/64 (BP Location: Left arm, BP Patient Position: Sitting)    Pulse 95    Temp 98.1 F (36.7 C)    Resp 19    Ht 5\' 2"  (1.575 m)    Wt 61.1 kg (134 lb 11.2 oz)    SpO2 99%    BMI 24.64 kg/m   HEENT: NC/AT  General: NAD, stting in chair,  Eyes: Anicetric sclera  Mouth: MMM  CV: RRR  Pulm: Good air entry, CTAB, no crackles   Abd:  Soft, non tender,  distended  Ext: 2 mm edema on both LE. No hip edema. Rt> Lt. Improving    Labs:  Lab Results   Component Value Date    NA 136 06/14/2017    K 3.8  06/14/2017    CL 92 (L) 06/14/2017    BICARB 29 06/14/2017    BUN 117 (H) 06/14/2017    CREAT 2.20 (H) 06/14/2017    GLU 98 06/14/2017    [place] 8.8 06/14/2017     Lab Results   Component Value Date    WBC 4.2 06/14/2017    HGB 8.1 (L) 06/14/2017    HCT 25.8 (L) 06/14/2017    PLT 40 (L) 06/14/2017    SEG 74 06/14/2017    LYMPHS 17 06/14/2017    MONOS 4 06/14/2017    EOS 0 06/14/2017       Assessment and Recommendations:    # AKI on CKD in the setting of PAH/Rt sided HF, responding well to diuretics now but still with discordant Is/Os and weight. UOP initially declined after stopping diuril but increased again yesterday  -Cr continues to fluctuate, overall stable.   -Losing 2-3 lbs every day. Getting closer to her dry weight EDW~ 129   -would continue the same Diuretics regiment today as below    # Lytes: Hyponatremia improved while diuresing. K at goal.   # Volume: Hypervolemic, improved significantly.  Would continue same diuresis regimen today with Lasix 100 mg QID IV(Reported hx of Bumex intolerance in the past) and Metolazone 5 mg daily and Aldactone 75 mg daily.   # Acid-Base: bicarb trended up while  diuresing but stable, CTM  # MBD: phos elevated, started Renvela 800 mg TID WM and continue renal diet.    # Anemia: Iron studies with low iron sat ~ 16, will start Iron sucrose 200 mg IV x5 doses QOD then start Epo once replete.       The above plan was discussed with the attending, Dr. Duayne Cal, MD  Nephrology Fellow, PGY 4  Division of Nephrology-Hypertension   Pager: [phone]

## 2017-06-14 NOTE — Plan of Care (Signed)
Problem: Promotion of Health and Safety  Goal: Promotion of Health and Safety  Description  The patient remains safe, receives appropriate treatment and achieves optimal outcomes (physically, psychosocially, and spiritually) within the limitations of the disease process by discharge.    Information below is the current care plan.  Flowsheets  Taken 06/14/2017 0720 by Vinie Sill, RN  Patient /Family stated Goal: To get rid of fluid  Taken 06/11/2017 0542 by Scheryl Darter, RN  Guidelines: Inpatient Nursing Guidelines  Taken 06/14/2017 1710 by Vinie Sill, RN  Individualized Interventions/Recommendations #1: Monitor strict I/O's to assess fluid balance, maintain 1500 ml fluid restriction  Individualized Interventions/Recommendations #2 (if applicable): Monitor pt's tolerance of flolan and dobutmatine; notify MD if pt has changes in vitals or symptoms  Individualized Interventions/Recommendations #3 (if applicable): Administer diuretics as ordered and encourage pt to ambulate and elevate BLE when supine to help improve edema/ascites  Individualized Interventions/Recommendations #4 (if applicable): Monitor electrolytes and for arrythmias while pt on diuretics  Note:   Pt tolerating diuretics and intoprope therapy, VSS.  Pt diruesing adequate amounts and complient with fluid restriction. Safety maintained.

## 2017-06-15 DIAGNOSIS — N189 Chronic kidney disease, unspecified: Secondary | ICD-10-CM

## 2017-06-15 DIAGNOSIS — N183 Chronic kidney disease, stage 3 (moderate): Secondary | ICD-10-CM

## 2017-06-15 LAB — CBC WITH DIFF, BLOOD
ANC-Automated: 2.9 10*3/uL (ref 1.6–7.0)
Abs Basophils: 0 10*3/uL (ref <0.1)
Abs Eosinophils: 0 10*3/uL (ref 0.1–0.5)
Abs Lymphs: 0.9 10*3/uL (ref 0.8–3.1)
Abs Monos: 0.3 10*3/uL (ref 0.2–0.8)
Basophils: 1 %
Eosinophils: 1 %
Hct: 26.2 % — ABNORMAL LOW (ref 34.0–45.0)
Hgb: 8.1 gm/dL — ABNORMAL LOW (ref 11.2–15.7)
Imm Gran %: 3 % — ABNORMAL HIGH (ref <1)
Imm Gran Abs: 0.1 10*3/uL (ref <0.1)
Lymphocytes: 20 %
MCH: 25.3 pg — ABNORMAL LOW (ref 26.0–32.0)
MCHC: 30.9 g/dL — ABNORMAL LOW (ref 32.0–36.0)
MCV: 81.9 um3 (ref 79.0–95.0)
Monocytes: 6 %
Plt Count: 43 10*3/uL — ABNORMAL LOW (ref 140–370)
RBC: 3.2 10*6/uL — ABNORMAL LOW (ref 3.90–5.20)
RDW: 18.6 % — ABNORMAL HIGH (ref 12.0–14.0)
Segs: 69 %
WBC: 4.2 10*3/uL (ref 4.0–10.0)

## 2017-06-15 LAB — BASIC METABOLIC PANEL, BLOOD
Anion Gap: 18 mmol/L — ABNORMAL HIGH (ref 7–15)
BUN: 126 mg/dL — ABNORMAL HIGH (ref 8–23)
Bicarbonate: 27 mmol/L (ref 22–29)
Calcium: 8.9 mg/dL (ref 8.5–10.6)
Chloride: 93 mmol/L — ABNORMAL LOW (ref 98–107)
Creatinine: 2.21 mg/dL — ABNORMAL HIGH (ref 0.51–0.95)
GFR: 22 mL/min
Glucose: 92 mg/dL (ref 70–99)
Potassium: 3.9 mmol/L (ref 3.5–5.1)
Sodium: 138 mmol/L (ref 136–145)

## 2017-06-15 LAB — PHOSPHORUS, BLOOD: Phosphorous: 6 mg/dL — ABNORMAL HIGH (ref 2.7–4.5)

## 2017-06-15 LAB — MAGNESIUM, BLOOD: Magnesium: 2.1 mg/dL (ref 1.6–2.4)

## 2017-06-15 NOTE — Progress Notes (Signed)
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 failure on 3Lpm now here with worsening LEx edema, AKI, now on dopamine. Tried lasix gtt, now back to lasix bolus.     INTERVAL:  Diuresing well. Slowly loosing her weight. Walking around the unit with husband. Improved  Kidney function is stable. Currently at 61kg.    MEDICATIONS  IV Drips  • DOPamine 2 mcg/kg/min (06/14/17 1941)   • epoprostenol (FLOLAN) infusion 35 ng/kg/min (06/14/17 1941)   • sodium chloride       Scheduled Medications  • furosemide  100 mg 4x Daily   • medroxyPROGESTERone  1.25 mg Q24H NR   • metolazone  2.5 mg Q24H   • potassium chloride  40 mEq BID   • Prostacyclin Cassette Change   Daily   • Prostacyclin Tubing   Once per day on Mon Wed Fri   • Prostacylin Batteries   Once per day on Mon   • sevelamer carbonate  800 mg TID WC   • sildenafil  80 mg TID   • sodium chloride (PF)  3 mL Q8H   • spironolactone  75 mg Daily     PRN Medications  • acetaminophen  650 mg Q6H PRN 650 mg at 06/15/17 0510   • loperamide  2 mg TID PRN 2 mg at 06/15/17 0730   • nalOXone  0.1 mg Q2 Min PRN     • simethicone  80 mg Q6H PRN 80 mg at 06/09/17 1851   • sodium chloride (PF)  3 mL PRN 3 mL at 05/30/17 2016   • sodium chloride   Continuous PRN       VITALS   Latest Entry  Range (last 24 hours)    Temperature: 98.5 °F (36.9 °C)  Temp  Avg: 98.3 °F (36.8 °C)  Min: 97.8 °F (36.6 °C)  Max: 98.6 °F (37 °C)    Blood pressure (BP): 125/65  BP  Min: 99/53  Max: 138/77    Heart Rate: 101  Pulse  Avg: 100.7  Min: 93  Max: 112    Respirations: 18  Resp  Avg: 19.4  Min: 18  Max: 20    SpO2: 98 %  SpO2  Avg: 97.6 %  Min: 95 %  Max: 100 %       No Data Recorded     Weight: 61.1 kg (134 lb 11.2 oz)  Percentage Weight Change (%): 0 %    Intake/Output       06/14/17 0600 - 06/15/17 0559 06/15/17 0600 - 06/16/17 0559      0600-1759 1800-0559  Total 0600-1759 1800-0559 Total       Intake    P.O.  880  330 1210  --  -- --    I.V.  49.1  59.3 108.4  --  -- --    Total Intake 929.1 389.3 1318.4 -- -- --       Output    Urine  800  1400 2200  --  -- --    Total Output 800 1400 2200 -- -- --       Net I/O     129.1 -1010.7 -881.6 -- -- --        Physical Exam:  Gen: A&O, conversing comfortably  Eyes:   EOMI, pupils equal, anicteric  HENT: Atraumatic, normocephalic  Neck: + JVD, trachea midline  CV: RRR, +murmur, accentuated S2, No R/G/S3. L tunneled IJ CVC site is CDI  Resp: stable bibasilar crackles, no intercostal retractions/normal effort  Abdo: NT mild D, soft, no masses.  Ext: 2+ BLEx edema, improving  Neuro: No focal weakness or sensory deficit, CN grossly intact  Psych: Appropriate affect and mood, good insight. AnOx3  Skin: Normal temperature, no rash or nodules. Diffuse erythema, likely related to IV Epo; + brusing.     Na 136 (06/02) CL 92* (06/02) BUN 117* (06/02) GLU   98 (06/02)   K 3.8 (06/02) CO2 29 (06/02) Cr 2.20* (06/02)      WBC 4.2 (06/03) HGB 8.1* (06/03) PLT 43* (06/03)    HCT 26.2* (06/03)      TP   ALT   TBILI   ALK PHOS      ALB   AST   DBILI        PT   PTT     INR       No results found for: ARTPH, ARTPCO2, ARTPO2    ASSESSMENT AND PLAN  67 yo F with WHO I PAH associated with SLE and prior anorexogen use admitted with acute on chronic right heart failure and volume overload as well as AKI and thrombocytopenia.  Persistent edema.  Minimal progress on exam and weight despite negative 20 L by I/O.    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 Initiate K binder    This patient was seen and discussed with my attending Dr. Papamatheakis.    Brandon Nokes, MD  Fellow, Pulmonary and Critical Care Medicine  [facility]      ATTENDING PROGRESS NOTE ATTESTATION    DOS: 06/15/17    Subjective  67 y/o F with CTD related PAH and chronic hypoxemic resp. failure, now admitted with acute on chronic RV CHF and volume overload, slow to respond to diuresis.     I have reviewed the history.  Interval history:  Continues on Dopa and high doses of IV LAsix and metolazone. Continues to be net negative and feels like she is making urine with little changes of her weight over the las few days.     ROS  No SOB at rest, +DOE, no CP, syncope, + edema. + abdominal distention, no D, N, V.  A complete ROS was negative except as noted above.    Objective  A&O, RRR +M, basilar crackles, NT +D with ascites but soft, +2 RLEx edema, +1 LLE edema. L tunneled CVC for IV Epo, with mdoerate sized cutaneous blood blister medial to the tunneled CVC.   I have examined the patient and concur with the fellow exam.    CXR  No new imaging.     Assessment and Plan  67 y/o F with CTD related PAH and chronic hypoxemic resp. failure, now admitted with acute on chronic RV CHF and volume overload,   slow to respond to diuresis.   1. Acute on chronic RV CHF:  - Continue 100mg IV lasix q6hrs and daily metolazone  - Continue Dopa at 2mcg  - Continue Aldactone  - Continue fluid restriction  2. PAH:  - IV Epo and PDE5i  - No changes to this regimen for now  3. AKI on CKD:  - Seems to not be changing much from a Cr perspective and UO  - Continued above diuresis and follow  - Renal on board  4. Chronic hypoxemic resp failure:  - Continue O2  5. Thrombocytopenia:  - Chronic and stable    I agree with the fellow care plan.  See the fellow note for further details.    Demosthenes Papamatheakis, MD  Pulmonary and Critical Care  PID [id number] / Pager [phone]

## 2017-06-15 NOTE — Plan of Care (Signed)
Problem: Promotion of Health and Safety  Goal: Promotion of Health and Safety  Description  The patient remains safe, receives appropriate treatment and achieves optimal outcomes (physically, psychosocially, and spiritually) within the limitations of the disease process by discharge.    Information below is the current care plan.  Outcome: Progressing  Flowsheets  Taken 06/14/2017 1938 by Atlee Abide, RN  Patient /Family stated Goal: Lose weight  Taken 06/11/2017 0542 by Scheryl Darter, RN  Guidelines: Inpatient Nursing Guidelines  Taken 06/14/2017 1710 by Vinie Sill, RN  Individualized Interventions/Recommendations #1: Monitor strict I/O's to assess fluid balance, maintain 1500 ml fluid restriction  Individualized Interventions/Recommendations #4 (if applicable): Monitor electrolytes and for arrythmias while pt on diuretics  Taken 06/15/2017 0228 by Atlee Abide, RN  Individualized Interventions/Recommendations #2 (if applicable): Monitor for s/s of resp distress, monitor VS  Taken 06/13/2017 2332 by Atlee Abide, RN  Outcome Evaluation (rationale for progressing/not progressing) every shift: Pt remained safe, educated on using the call light prior to exiting bed. VSS, no resp distress noted; continue to monitor. Continue to monitor intake and output.

## 2017-06-15 NOTE — Plan of Care (Signed)
Problem: Promotion of Health and Safety  Goal: Promotion of Health and Safety  Description  The patient remains safe, receives appropriate treatment and achieves optimal outcomes (physically, psychosocially, and spiritually) within the limitations of the disease process by discharge.    Information below is the current care plan.  Outcome: Progressing  Flowsheets  Taken 06/14/2017 1938 by Atlee Abide, RN  Patient /Family stated Goal: Lose weight  Taken 06/11/2017 0542 by Scheryl Darter, RN  Guidelines: Inpatient Nursing Guidelines  Individualized Interventions/Recommendations #5 (if applicable): electroylte replacement therapy  Taken 06/14/2017 1710 by Vinie Sill, RN  Individualized Interventions/Recommendations #1: Monitor strict I/O's to assess fluid balance, maintain 1500 ml fluid restriction  Individualized Interventions/Recommendations #3 (if applicable): Administer diuretics as ordered and encourage pt to ambulate and elevate BLE when supine to help improve edema/ascites  Individualized Interventions/Recommendations #4 (if applicable): Monitor electrolytes and for arrythmias while pt on diuretics  Taken 06/15/2017 0228 by Atlee Abide, RN  Individualized Interventions/Recommendations #2 (if applicable): Monitor for s/s of resp distress, monitor VS  Taken 06/13/2017 2332 by Atlee Abide, RN  Outcome Evaluation (rationale for progressing/not progressing) every shift: Pt remained safe, educated on using the call light prior to exiting bed. VSS, no resp distress noted; continue to monitor. Continue to monitor intake and output.  Note:   Assessed. Pt on tele monitoring. Instructed patient to report chest pain/palpitations/pressure. Pt heart rhythm shows SR-ST Blood pressure 125/65, pulse 101, temperature 98.5 F (36.9 C), resp. rate 18, height 5\' 2"  (1.575 m), weight 61.1 kg (134 lb 11.2 oz), SpO2 98 %. Denies CP or palpitation during the shift. trace edema noted on LEs. Pt on Lasix  IV, pulses palpable with good capillary refill. will continue to monitor pt.  Lab Results   Component Value Date    NA 138 06/15/2017    K 3.9 06/15/2017    CL 93 (L) 06/15/2017    BICARB 27 06/15/2017    BUN 117 (H) 06/14/2017    CREAT 2.21 (H) 06/15/2017    GLU 92 06/15/2017    Ginger Blue 8.9 06/15/2017    Mg/Phos:  2.1/6.0 (06/03 0354)

## 2017-06-15 NOTE — Progress Notes (Signed)
NEPHROLOGY PROGRESS NOTE    Chief Complaint: Edema (C/O SWELLING TO BOTH FEET FOR THE LAST 5 DAYS.  C/O ABD BLOATING.  DC'D FROM A HOSP IN [place] FOR DIARRHEA.  TAKEN OFF LASIX FOR THE LAST 5 DAYS.  [facility] [place].  PT IS A FLOLAN PT.)  Reason for Consultation: AKI evaluation  Hospital Day #31 Days 8 Hours  PrimaryTeam/ Attending Provider: Larna Daughters., MD    ID/ Assessment:   Diana Chambers is a 67 year old female w/ PMH of hx of Group 1 PAH associated with SLE and /Sjogren's disease, presented to the hospital with worsening legs swelling. 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   _____________________________________________________________________________    Plan (Today's recommendations in bold):  # AKI on CKD in the setting of PAH/Rt sided HF, responding well to diuretics now but still with discordant Is/Os and weight. UOP initially declined after stopping diuril but increased again yesterday  -Cr continues to fluctuate, overall stable.   -Estimated dry weight ~129 lb, possibly lower d/t prolonged hospitalization and loss of muscle weight.  -Defer to pulmonary team regarding transitioning lasix from IV to PO.  -06/02 explained in detail the CKD stage and her risk of progression to ESRD- pt mentioned she wouldn't want dialysis if it got to that.    # Lytes: Hyponatremia improved while diuresing. K at goal.   # Volume: Hypervolemic, improved significantly.Diuresis regimen: lasix, spironolactone, metolazone  # Acid-Base: bicarb trended up while diuresing but stable, CTM  # MBD: phos elevated- Renvela 800 mg TID WM + recommend renal diet.  # Anemia: Iron studies with low iron sat ~ 16, recommend starting Iron sucrose 200 mg IV qday x5 doses (ordered as a courtesy) then start Epogen 4000U SQ qweekly once replete.   # Dispo: f/u on discharge w/ Dr. Donnelly Stager in renal clinic.    Case seen  and discussed with Dr. Cindie Laroche.  See attending addendum for changes in above plan.    Bonnita Levan, MD  Nephrology Fellow, PGY 4  Division of Nephrology-Hypertension   [facility] [place]  [address]  [place], [place] [address]  Pager: [phone]  _____________________________________________________________________________    Interval Events: [name]- NAE   Subjective: Denies CP, SOB. Overall swelling is better. About to eat breakfast this morning, but waiting for her sevelamer dose. Reports she is not drinking extra water.  Review of Systems: A full ROS performed and negative outside above subjective history.  _____________________________________________________________________________    Physical Exam:  General: NAD, stting in chair,  HEENT: NC/AT  Eyes: Anicetric sclera  Mouth: MMM  CV: RRR  Pulm: Good air entry, CTAB, no crackles   Abd:  Soft, non tender,  distended  GU: No foley  Ext: Improving 2 mm edema on both LE. No hip edema. Rt> Lt. Improving.    24hour Vital Signs:  Temperature:  [98 F (36.7 C)-98.6 F (37 C)] 98.6 F (37 C) (06/04 0340)  Blood pressure (BP): (104-135)/(48-71) 135/71 (06/04 0340)  Heart Rate:  [95-106] 104 (06/04 0340)  Respirations:  [16-20] 18 (06/03 2322)  Pain Score: Patient Sleeping, Respiratory Assessment Done (06/04 0439)  O2 Device: Nasal cannula (06/04 0340)  O2 Flow Rate (L/min):  [3 l/min] 3 l/min (06/04 0340)  SpO2:  [95 %-100 %] 97 % (06/04 0340)    Most Recent Vital Signs:  06/15/17  1748 06/15/17  1946 06/15/17  2322 06/16/17  0340   BP: 117/62 114/59 104/48 135/71   Pulse: 98 104 106 104   Resp:  20 18    Temp:  98.5 F (36.9 C) 98 F (36.7 C) 98.6 F (37 C)   SpO2:  100% 95% 97%     Intake/Output:  06/03 0600 - 06/04 0559  In: 980 [P.O.:830; I.V.:150]  Out: 2600 [Urine:2600]  Net: -1620  06/15/2017 0600 - 06/16/2017 0600  Urine x0  Stool x4  Emesis x0    Wt Readings from Last 3 Encounters:   06/16/17 61.1 kg (134 lb 11.2 oz)   01/21/17  58.9 kg (129 lb 14.4 oz)   10/15/16 67.1 kg (148 lb)     Diet Regular; 1580ml Volume Restriction  _____________________________________________________________________________    Labs reviewed; significant for:  Recent Labs     06/14/17  0549 06/15/17  0626 06/16/17  0555   NA 136 138 138   K 3.8 3.9 4.0   CL 92* 93* 94*   BICARB 29 27 29    BUN 117* 126* 120*   CREAT 2.20* 2.21* 2.29*   GFRNON 22 22 21    GLU 98 92 90   ANION 15 18* 15   [place] 8.8 8.9 8.9   MG 2.0 2.1 2.1   PHOS 5.9* 6.0* 5.9*     Recent Labs     06/14/17  0549 06/15/17  0627   WBC 4.2 4.2   HGB 8.1* 8.1*   HCT 25.8* 26.2*   PLT 40* 43*     Fe 33* (06/02) TIBC 211 (06/02) Fe Sat 16 (06/02) FERRITIN 73 (06/02).  _____________________________________________________________________________  Microbiology  Lab Results   Component Value Date    BCRES No Growth 05/16/2017     Lab Results   Component Value Date    URINECULTURE (A) 06/11/2016     Staphylococcus aureus  >100,000 colonies/mL  Two colony types; same susceptibility pattern  Identification performed by [employer] [facility]( [facility]). This test  was developed and its performance characteristics determined by [facility]. It has not been cleared or  approved by the U.S. Food and Drug Administration.  The FDA has  determined that such clearance or approval is not necessary.       Lab Results   Component Value Date    GSRES  02/22/2013     Rare Gram positive cocci  Rare yeast  (Performed at [facility]       Lab Results   Component Value Date    CXRES Moderate Normal Respiratory Flora (A) 06/15/2011    CXRES Moraxella (Branhamella) catarrhalis  Heavy 06/15/2011    CXRES Streptococcus group C  Rare 06/15/2011     _____________________________________________________________________________  Medications reviewed   furosemide  100 mg 4x Daily    medroxyPROGESTERone  1.25 mg Q24H NR    metolazone  2.5 mg Q24H    potassium chloride  40 mEq BID    Prostacyclin Cassette Change    Daily    Prostacyclin Tubing   Once per day on Mon Wed Fri    Prostacylin Batteries   Once per day on Mon    sevelamer carbonate  800 mg TID WC    sildenafil  80 mg TID    sodium chloride (PF)  3 mL Q8H    spironolactone  75 mg Daily      DOPamine 2 mcg/kg/min (06/15/17 1928)    epoprostenol (FLOLAN) infusion  35 ng/kg/min (06/15/17 2323)    sodium chloride        acetaminophen  650 mg Q6H PRN    loperamide  2 mg TID PRN    nalOXone  0.1 mg Q2 Min PRN    simethicone  80 mg Q6H PRN    sodium chloride (PF)  3 mL PRN    sodium chloride   Continuous PRN     _____________________________________________________________________________  Imaging reviewed  X-Ray Chest Single View  Narrative: EXAM DESCRIPTION:  X-RAY CHEST SINGLE VIEW    CLINICAL HISTORY:  Pulmonary hypertension    COMPARISON:  08/11/2016    TECHNIQUE:  Frontal chest radiograph.    FINDINGS:  Lungs are well expanded. Bilateral perihilar vascular prominence which may be related to sequela of pulmonary hypertension. Possible component of mild interstitial pulmonary edema. Mild blunting the right lateral costophrenic sulcus. No pneumothorax. Normal trachea. Enlargement of the central pulmonary arteries. Increased size of the cardiac silhouette from prior. Aortic calcifications. Normal mediastinal contour. No acute osseous abnormality. Central venous catheter with tip in the proximal right atrium.             Signed by: Rolly Salter 05/16/2017 11:17:07  Impression: IMPRESSION:  Enlargement of the cardiac silhouette from the prior chest radiograph which may related to cardiac chamber enlargement/increased intravascular volume but pericardial effusion cannot be excluded.    Sequela of portal hypertension. Query mild interstitial pulmonary edema and small right pleural effusion.    _____________________________________________________________________________  Allergies  Allergies   Allergen Reactions    Allopurinol Hives    Levaquin Swelling  and Other     Severe joint pain      Ofloxacin Nausea and Vomiting    Chlorhexidine Itching    Sulfa Drugs Unspecified     _____________________________________________________________________________

## 2017-06-16 LAB — BASIC METABOLIC PANEL, BLOOD
Anion Gap: 15 mmol/L (ref 7–15)
BUN: 120 mg/dL — ABNORMAL HIGH (ref 8–23)
Bicarbonate: 29 mmol/L (ref 22–29)
Calcium: 8.9 mg/dL (ref 8.5–10.6)
Chloride: 94 mmol/L — ABNORMAL LOW (ref 98–107)
Creatinine: 2.29 mg/dL — ABNORMAL HIGH (ref 0.51–0.95)
GFR: 21 mL/min
Glucose: 90 mg/dL (ref 70–99)
Potassium: 4 mmol/L (ref 3.5–5.1)
Sodium: 138 mmol/L (ref 136–145)

## 2017-06-16 LAB — PHOSPHORUS, BLOOD: Phosphorous: 5.9 mg/dL — ABNORMAL HIGH (ref 2.7–4.5)

## 2017-06-16 LAB — MAGNESIUM, BLOOD: Magnesium: 2.1 mg/dL (ref 1.6–2.4)

## 2017-06-16 MED ORDER — ALBUTEROL SULFATE (5 MG/ML) 0.5% IN NEBU
2.5000 mg | INHALATION_SOLUTION | RESPIRATORY_TRACT | Status: DC | PRN
Start: 2017-06-16 — End: 2017-06-16

## 2017-06-16 MED ORDER — METOLAZONE 5 MG OR TABS
5.00 mg | ORAL_TABLET | ORAL | Status: DC
Start: 2017-06-16 — End: 2017-06-23
  Administered 2017-06-16 – 2017-06-22 (×7): 5 mg via ORAL
  Filled 2017-06-16 (×7): qty 1

## 2017-06-16 MED ORDER — SODIUM CHLORIDE 0.9 % IV SOLN
200.0000 mg | Freq: Every day | INTRAVENOUS | Status: AC
Start: 2017-06-16 — End: 2017-06-20
  Administered 2017-06-16 – 2017-06-20 (×5): 200 mg via INTRAVENOUS
  Filled 2017-06-16 (×5): qty 10

## 2017-06-16 MED ORDER — SODIUM CHLORIDE 0.9 % IV SOLN
10.0000 mL/h | INTRAVENOUS | Status: DC
Start: 2017-06-16 — End: 2017-06-16

## 2017-06-16 MED ORDER — EPINEPHRINE 0.3 MG/0.3ML IJ SOAJ
0.3000 mg | INTRAMUSCULAR | Status: DC | PRN
Start: 2017-06-16 — End: 2017-06-16

## 2017-06-16 MED ORDER — ACETAMINOPHEN 325 MG PO TABS
650.0000 mg | ORAL_TABLET | Freq: Once | ORAL | Status: AC
Start: 2017-06-16 — End: 2017-06-16
  Administered 2017-06-16: 650 mg via ORAL
  Filled 2017-06-16: qty 2

## 2017-06-16 MED ORDER — HYDROCORTISONE SOD SUCCINATE 100 MG IJ SOLR (CUSTOM)
100.0000 mg | Freq: Once | INTRAMUSCULAR | Status: DC | PRN
Start: 2017-06-16 — End: 2017-06-16

## 2017-06-16 MED ORDER — DIPHENHYDRAMINE HCL 12.5 MG/5ML OR ELIX
25.0000 mg | ORAL_SOLUTION | ORAL | Status: DC | PRN
Start: 2017-06-16 — End: 2017-06-16

## 2017-06-16 NOTE — Plan of Care (Signed)
Problem: Promotion of Health and Safety  Goal: Promotion of Health and Safety  Description  The patient remains safe, receives appropriate treatment and achieves optimal outcomes (physically, psychosocially, and spiritually) within the limitations of the disease process by discharge.    Information below is the current care plan.  Outcome: Progressing  Flowsheets  Taken 06/16/2017 7939 by Glennon Hamilton, RN  Patient /Family stated Goal: To get fluid off  Taken 06/16/2017 1743 by Glennon Hamilton, RN  Guidelines: Inpatient Nursing Guidelines  Outcome Evaluation (rationale for progressing/not progressing) every shift: VSS. Pt OOB to chair and up to ambulate in halls, c/o SOB with exertion. Strict I&O in place, diuretics administered as ordered. Dopamine dc'd today. Flolan continuous infusion.  Taken 06/14/2017 1710 by Vinie Sill, RN  Individualized Interventions/Recommendations #1: Monitor strict I/O's to assess fluid balance, maintain 1500 ml fluid restriction  Individualized Interventions/Recommendations #3 (if applicable): Administer diuretics as ordered and encourage pt to ambulate and elevate BLE when supine to help improve edema/ascites  Individualized Interventions/Recommendations #4 (if applicable): Monitor electrolytes and for arrythmias while pt on diuretics

## 2017-06-16 NOTE — Plan of Care (Signed)
Problem: Promotion of Health and Safety  Goal: Promotion of Health and Safety  Description  The patient remains safe, receives appropriate treatment and achieves optimal outcomes (physically, psychosocially, and spiritually) within the limitations of the disease process by discharge.    Information below is the current care plan.  Outcome: Progressing  Flowsheets  Taken 06/15/2017 1946 by Abel Presto, RN  Patient /Family stated Goal: take one more walk  Taken 06/16/2017 0128 by Abel Presto, RN  Guidelines: Inpatient Nursing Guidelines  Individualized Interventions/Recommendations #2 (if applicable): cluster care to minimize interruption and promote restful sleep; educate patient to notify nurse for any assistance needed overnight  Outcome Evaluation (rationale for progressing/not progressing) every shift: pt independently walked 6 laps before bed this evening with husbad; pt independent in room, but calling appropriately for assitance to use bathroom at night; VSS, pt denies chest pain, SOB, or dizziness; skin kep c/d/i; Flolan and dopamine gtt running as ordered  Taken 06/14/2017 1710 by Vinie Sill, RN  Individualized Interventions/Recommendations #1: Monitor strict I/O's to assess fluid balance, maintain 1500 ml fluid restriction  Individualized Interventions/Recommendations #3 (if applicable): Administer diuretics as ordered and encourage pt to ambulate and elevate BLE when supine to help improve edema/ascites

## 2017-06-16 NOTE — Interdisciplinary (Signed)
06/16/17 1107   Assessment   Assessment Type Initial   Referral Information   Referral Type Adjustment to Illness/Coping   Social Assessment   Where was the patient admitted from? Home   Prior to Level of Function Ambulatory/Independent with ADL's  (O2 concentrator, pt is O2 dependent)   Primary Caretaker(s) Alone   Primary Contact Name, Number and Relationship Diana Chambers, spouse: [phone]   Permission to Contact Yes   Is Patient Minor? No   Interpreter Used? Not Needed   Literacy Can read;Can write   Social Determinates of Health   Living Arrangements Spouse /Significant Other   Available Assistance/Support System Children;Spouse / significant other   Type of Residence Private Residence   Has discharge transport been arranged? Yes  (Pt spouse will provide a ride home)   Designer, industrial/product Engaged in Discharge Planning Yes   [place]  (Pt main provider is Dr. [name] @[place]  and pt is also established with an NP, [name], in [place])   Medication Compliance Adheres to medication  (pt manages her meds and is compliant)   [place]   (pt husband chose not to disclose this information)   Plans/Interventions/Discharge   Anticipated Discharge [place]   Discharge Resources Given SW offered Advanced Directives form but pt husband said they already have it. Pt made aware that she can notarize the form while hospitalized if she wishes to     Pt is a married, Caucasian, 67 y/o woman who was admitted due to leg edema. PMH includes: WHO group 1, connective tissue/lupus, Sjoegren syndrome and O2 dependency.     I met with pt and her husband, Diana Chambers, at bed side and introduced myself and role; pt is sitting on the chair with O2 in place and agreed to SW assessment.     Prior to the admission, pt was living with her husband, Diana Chambers, in [place], [place], and was mostly I-ADLS. Pt is O2 dependent and has 2 [employer] concentrators.   Diana Chambers is pr primary contact and DPOA  for healthcare (no from available now, pt verbally appointed him).    Pt drove 8h to [facility] when she noticed to have swollen legs; she has been followed by Dr. [name] @[place]  for several years.    Pt has a Flolan pump and she manages it along with her other prescription medications.     Pt has 2 adult child, [name], with whom she is in contact and who is aware of her hospitalization.     Pt appears to have a good support system and feels safe. Her husband has been at her bedside during this entire hospitalization and continues to be supportive.  Pt has been hospitalized for 30 days now and appears to be coping appropriately with this long hospitalization.      Advanced Directives was discussed and pt questions were all answered.   Pt was provided with SW direct contact information should she decide to notarize the form.     No other SW needs at this time.     Beverely Risen, MSW  Ph: [phone]  Pager: [phone]

## 2017-06-16 NOTE — Progress Notes (Signed)
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 failure on 3Lpm now here with worsening LEx edema, AKI, now on dopamine. Tried lasix gtt, now back to lasix bolus.     INTERVAL:  Diuresing well. Slowly loosing her weight. In good spirits despite weight remaining at [place] for last couple of days. Doesn't feel like abdominal swelling has improved, but legs are slightly smaller today. [place] about life [place] the [place] - after some discussion, [place] both [place].    MEDICATIONS  IV Drips   DOPamine 2 mcg/kg/min (06/15/17 1928)    epoprostenol (FLOLAN) infusion 35 ng/kg/min (06/15/17 2323)    sodium chloride       Scheduled Medications   furosemide  100 mg 4x Daily    medroxyPROGESTERone  1.25 mg Q24H NR    metolazone  2.5 mg Q24H    potassium chloride  40 mEq BID    Prostacyclin Cassette Change   Daily    Prostacyclin Tubing   Once per day on Mon Wed Fri    Prostacylin Batteries   Once per day on Mon    sevelamer carbonate  800 mg TID WC    sildenafil  80 mg TID    sodium chloride (PF)  3 mL Q8H    spironolactone  75 mg Daily     PRN Medications   acetaminophen  650 mg Q6H PRN 650 mg at 06/16/17 0339    loperamide  2 mg TID PRN 2 mg at 06/16/17 0657    nalOXone  0.1 mg Q2 Min PRN      simethicone  80 mg Q6H PRN 80 mg at 06/09/17 1851    sodium chloride (PF)  3 mL PRN 3 mL at 05/30/17 2016    sodium chloride   Continuous PRN       VITALS   Latest Entry  Range (last 24 hours)    Temperature: 98.6 F (37 C)  Temp  Avg: 98.3 F (36.8 C)  Min: 97.8 F (36.6 C)  Max: 98.6 F (37 C)    Blood pressure (BP): 135/71  BP  Min: 99/53  Max: 138/77    Heart Rate: 104  Pulse  Avg: 100.7  Min: 93  Max: 112    Respirations: 18  Resp  Avg: 19.4  Min: 18  Max: 20    SpO2: 97 %  SpO2  Avg: 97.6 %  Min: 95 %  Max: 100 %       No  Data Recorded     Weight: 61.1 kg (134 lb 11.2 oz)  Percentage Weight Change (%): 0 %    Intake/Output       06/15/17 0600 - 06/16/17 0559 06/16/17 0600 - 06/17/17 0559      4315-4008 6761-9509 Total 0600-1759 3267-1245 Total       Intake    P.O.  590  240 830  --  -- --    I.V.  150  -- 150  --  -- --    Total Intake 740 240 980 -- -- --       Output    Urine  1200  1400 2600  --  -- --    Total Output 1200 1400 2600 -- -- --  Net I/O     -460 -1160 -1620 -- -- --        Physical Exam:  Gen: A&O, conversing comfortably  Eyes: EOMI, pupils equal, anicteric  HENT: Atraumatic, normocephalic  Neck: + JVD, trachea midline  CV: RRR, +murmur, accentuated S2, No R/G/S3. L tunneled IJ CVC site is CDI  Resp: stable bibasilar crackles, no intercostal retractions/normal effort  Abdo: NT mild D, soft, no masses.  Ext: 1+ BLEx edema, improving  Neuro: No focal weakness or sensory deficit, CN grossly intact  Psych: Appropriate affect and mood, good insight. AnOx3  Skin: Normal temperature, no rash or nodules. Diffuse erythema, likely related to IV Epo; + brusing.     Na 138 (06/04) CL 94* (06/04) BUN 120* (06/04) GLU   90 (06/04)   K 4.0 (06/04) CO2 29 (06/04) Cr 2.29* (06/04)      WBC 4.2 (06/03) HGB 8.1* (06/03) PLT 43* (06/03)    HCT 26.2* (06/03)      TP   ALT   TBILI   ALK PHOS      ALB   AST   DBILI        PT   PTT     INR       No results found for: ARTPH, ARTPCO2, ARTPO2    ASSESSMENT AND PLAN  67 yo F with WHO I PAH associated with SLE and prior anorexogen use admitted with acute on chronic right heart failure and volume overload as well as AKI and thrombocytopenia.Persistent edema.  Minimal progress on exam and weight despite negative 20 L by I/O.    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  K binder    This patient was seen and discussed with my attending Dr. [name].    Brynda Rim, MD  Fellow, Pulmonary and [facility]    Pulmonary Vascular Attending Addendum:  Patient was examined with Dr. Artist Beach, imaging and tests reviewed.  Please see above note for full details and plan.    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  Possibly wean to off.  - Continue home sildenafil and flolan at 35 ng/kg/min  - Continue oxygen suppl  - Fluid restrict to 1.5 L/day  - Iron Sucrose for iron def anemia    DGeorgiann MohsMD  Pager [phone]

## 2017-06-16 NOTE — Progress Notes (Signed)
NEPHROLOGY PROGRESS NOTE    Chief Complaint: Edema (C/O SWELLING TO BOTH FEET FOR THE LAST 5 DAYS.  C/O ABD BLOATING.  DC'D FROM A HOSP IN [place] FOR DIARRHEA.  TAKEN OFF LASIX FOR THE LAST 5 DAYS.  DROVE [place].  PT IS A FLOLAN PT.)  Reason for Consultation: AKI evaluation  Hospital Day #33 Days 8 Hours  PrimaryTeam/ Attending Provider: Larna Daughters., MD    ID/ Assessment:   Diana Chambers is a 67 year old female w/ PMH of hx of Group 1 PAH associated with SLE and /Sjogren's disease, presented to the hospital with worsening legs swelling. 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   _____________________________________________________________________________    Plan (Today's recommendations in bold):  # ?AKI on CKD in the setting of PAH/Rt sided HF, responding well to diuretics now but still with discordant I/Os and weight. UOP initially declined after stopping diuril but increased again yesterday  -sCr uptrending.  -Estimated dry weight ~129 lb, possibly lower d/t prolonged hospitalization and loss of muscle weight.  -Defer to pulmonary team regarding transitioning lasix from IV to PO.  -06/02 explained in detail the CKD stage and her risk of progression to ESRD- pt mentioned she wouldn't want dialysis if it got to that.    # Lytes: Hyponatremia improved while diuresing. K at goal.   # Volume: Hypervolemic, improved significantly.Diuresis regimen: lasix, spironolactone, metolazone. F/u PVM plan regarding paracentesis.  # Acid-Base: bicarb trended up while diuresing but stable, CTM  # MBD: phos elevated- Renvela 800 mg TID WM + recommend renal diet or at least low phosphorus diet.  # Anemia: Iron studies with low iron sat ~ 16, Iron sucrose 200 mg IV qday x5 doses (end 06/08) then start Epogen 4000U SQ qweekly once replete.   # Dispo: f/u on discharge w/ Dr. Donnelly Stager in renal  clinic.    Case seen and discussed with Dr. Cindie Laroche.  See attending addendum for changes in above plan.    Bonnita Levan, MD  Nephrology Fellow, PGY 4  Division of Nephrology-Hypertension   [facility] [place] [facility]  [phone]  [place], [place] [address]  Pager: [phone]  _____________________________________________________________________________    Interval Events: [facility]- Restarted dopamine 60mcg/hr, diuril additional dose, abdominal ultrasound with large 12.5cm ascites.  Subjective: Denies CP, SOB. Swelling she thinks is worse, feels like she urinated less yesterday.  Review of Systems: A full ROS performed and negative outside above subjective history.  _____________________________________________________________________________    Physical Exam:  General: NAD, stting in chair,  HEENT: NC/AT  Eyes: Anicetric sclera  Mouth: MMM  CV: RRR  Pulm: Good air entry, CTAB, no crackles   Abd:  Soft, non tender,  distended  GU: No foley  Ext: 3-4 mm edema on both LE.    24hour Vital Signs:  Temperature:  [98 F (36.7 C)-98.6 F (37 C)] 98 F (36.7 C) (06/06 0354)  Blood pressure (BP): (118-129)/(55-75) 122/75 (06/06 0354)  Heart Rate:  [98-114] 102 (06/06 0354)  Respirations:  [18-20] 18 (06/06 0354)  Pain Score: 0 (06/06 0354)  O2 Device: Nasal cannula (06/06 0354)  O2 Flow Rate (L/min):  [3 l/min] 3 l/min (06/06 0354)  SpO2:  [94 %-100 %] 97 % (06/06 0354)    Most Recent Vital Signs:   06/17/17  2336 06/18/17  0200 06/18/17  0300  06/18/17  0354   BP: 129/74   122/75   Pulse: 114 103 103 102   Resp: 20   18   Temp: 98.6 F (37 C)   98 F (36.7 C)   SpO2: 97%   97%     Intake/Output:  06/05 0600 - 06/06 0559  In: 698.5 [P.O.:550; I.V.:148.5]  Out: 4081 [Urine:1825]  Net: -1126.5  06/17/2017 0600 - 06/18/2017 0600  Urine x0  Stool x2  Emesis x0    Wt Readings from Last 3 Encounters:   06/18/17 62.3 kg (137 lb 6.4 oz)   01/21/17 58.9 kg (129 lb 14.4 oz)   10/15/16 67.1 kg (148 lb)      Diet Regular; 1565ml Volume Restriction  _____________________________________________________________________________    Labs reviewed; significant for:  Recent Labs     06/16/17  0555 06/17/17  0615 06/18/17  0606   NA 138 137 133*   K 4.0 4.1 3.6   CL 94* 94* 91*   BICARB 29 26 24    BUN 120* 116*  --    CREAT 2.29* 2.33* 2.51*   GFRNON 21 21 19    GLU 90 93 87   ANION 15 17* 18*   [place] 8.9 9.0 9.0   MG 2.1 2.2 2.1   PHOS 5.9* 6.0* 5.7*     Recent Labs     06/17/17  0611   WBC 3.8*   HGB 8.4*   HCT 27.5*   PLT 59*     Fe   TIBC   Fe Sat   FERRITIN  .  _____________________________________________________________________________  Microbiology  Lab Results   Component Value Date    BCRES No Growth 05/16/2017     Lab Results   Component Value Date    URINECULTURE (A) 06/11/2016     Staphylococcus aureus  >100,000 colonies/mL  Two colony types; same susceptibility pattern  Identification performed by [employer]( [facility]). This test  was developed and its performance characteristics determined by [facility]. It has not been cleared or  approved by the U.S. Food and Drug Administration.  The FDA has  determined that such clearance or approval is not necessary.       Lab Results   Component Value Date    GSRES  02/22/2013     Rare Gram positive cocci  Rare yeast  (Performed at [facility]       Lab Results   Component Value Date    CXRES Moderate Normal Respiratory Flora (A) 06/15/2011    CXRES Moraxella (Branhamella) catarrhalis  Heavy 06/15/2011    CXRES Streptococcus group C  Rare 06/15/2011     _____________________________________________________________________________  Medications reviewed   furosemide  100 mg 4x Daily    iron sucrose  200 mg Daily    medroxyPROGESTERone  1.25 mg Q24H NR    metolazone  5 mg Q24H    Prostacyclin Cassette Change   Daily    Prostacyclin Tubing   Once per day on Mon Wed Fri    Prostacylin Batteries   Once per day on Mon    sevelamer  carbonate  800 mg TID WC    sildenafil  80 mg TID    sodium chloride (PF)  3 mL Q8H    spironolactone  75 mg Daily      DOPamine 2 mcg/kg/min (06/17/17 2000)    epoprostenol (FLOLAN) infusion 35 ng/kg/min (06/17/17 2000)    sodium chloride        acetaminophen  650 mg Q6H PRN    loperamide  2 mg TID PRN    nalOXone  0.1 mg Q2 Min PRN    simethicone  80 mg Q6H PRN    sodium chloride (PF)  3 mL PRN    sodium chloride   Continuous PRN     _____________________________________________________________________________  Imaging reviewed  US Abdomen Limited  Narrative: EXAM DESCRIPTION:  US ABDOMEN LIMITED    CLINICAL HISTORY:  Evaluate for ascites    TECHNIQUE:  Targeted grayscale ultrasound of the 4 abdominal quadrants and pelvis for evaluation of ascites.    COMPARISON:  Abdominal ultrasound 05/29/2016    FINDINGS:  Targeted ultrasound of the 4 abdominal quadrants and pelvis was performed, which demonstrates large volume ascites, with the largest vertical pocket in the left lower quadrant measuring up to 12.5 cm.    CONCURRENT SUPERVISION:  I have reviewed the images and agree with the resident's interpretation.           Preliminary created by: Warnell Bureau   Signed byFredric Dine 06/17/2017 20:12:11  Impression: IMPRESSION:  Large volume ascites.    _____________________________________________________________________________  Allergies  Allergies   Allergen Reactions    Allopurinol Hives    Levaquin Swelling and Other     Severe joint pain      Ofloxacin Nausea and Vomiting    Chlorhexidine Itching    Sulfa Drugs Unspecified     _____________________________________________________________________________

## 2017-06-16 NOTE — Interdisciplinary (Signed)
06/16/17 1111   Follow Up/Progress   Is the Patient Ready for Discharge No   Supplies/Services  IV infusion/antibiotics   Barriers to Discharge Awaiting clinical improvement   Family/Caregiver's Assessed for Readiness, willingness, and ability to provide or support self-management activities   Respite Care Not Applicable   Patient/Family Are In Agreement With Discharge Plan Other (Comment)  (Pending DC needs.  )     Medical necessity Reason for continued Hospital Stay:  acute on chronic right heart failure and volume overload as well as AKI and thrombocytopenia.Persistent edema.  ?  Interventions requiring continued Hospitalization/Plan of Care:  -Cont lasix IVP diuretic  -Cont dopamine gtt @ 70mcg/kg/min  - Continuous supplemental O2 @ 3L per NC  - Cont home flolan gtt  ?  Anticipated discharge plan at this time (home, SNF, etc.):  Home w/ HH  ?  Barriers to Discharge:  None.  ?  Expected DC Date:  TBD.    Pt is currently on service with [employer], for Flolan gtt. Pt on service with [employer], for Oxygen DME. CM to confirm service resumption with both companies, upon discharge.  CM to continue to follow clinical progress.  DC pending clinical improvement.

## 2017-06-17 ENCOUNTER — Inpatient Hospital Stay (HOSPITAL_COMMUNITY): Payer: PPO

## 2017-06-17 DIAGNOSIS — R188 Other ascites: Secondary | ICD-10-CM

## 2017-06-17 LAB — MDIFF
Immature Granulocytes Absolute Manual: 0.2 10*3/uL — ABNORMAL HIGH (ref 0.0–0.1)
Metamyelocytes: 2 %
Myelocytes: 2 %
Number of Cells Counted: 113
Plt Est: DECREASED

## 2017-06-17 LAB — CBC WITH DIFF, BLOOD
ANC-Manual Mode: 2.6 10*3/uL (ref 1.6–7.0)
Abs Basophils: 0.1 10*3/uL (ref <0.1)
Abs Eosinophils: 0 10*3/uL (ref 0.1–0.5)
Abs Lymphs: 0.9 10*3/uL (ref 0.8–3.1)
Abs Monos: 0.1 10*3/uL — ABNORMAL LOW (ref 0.2–0.8)
Basophils: 2 %
Eosinophils: 0 %
Hct: 27.5 % — ABNORMAL LOW (ref 34.0–45.0)
Hgb: 8.4 gm/dL — ABNORMAL LOW (ref 11.2–15.7)
Lymphocytes: 23 %
MCH: 24.9 pg — ABNORMAL LOW (ref 26.0–32.0)
MCHC: 30.5 g/dL — ABNORMAL LOW (ref 32.0–36.0)
MCV: 81.6 um3 (ref 79.0–95.0)
Monocytes: 3 %
Plt Count: 59 10*3/uL — ABNORMAL LOW (ref 140–370)
RBC: 3.37 10*6/uL — ABNORMAL LOW (ref 3.90–5.20)
RDW: 18.7 % — ABNORMAL HIGH (ref 12.0–14.0)
Segs: 68 %
WBC: 3.8 10*3/uL — ABNORMAL LOW (ref 4.0–10.0)

## 2017-06-17 LAB — BASIC METABOLIC PANEL, BLOOD
Anion Gap: 17 mmol/L — ABNORMAL HIGH (ref 7–15)
BUN: 116 mg/dL — ABNORMAL HIGH (ref 8–23)
Bicarbonate: 26 mmol/L (ref 22–29)
Calcium: 9 mg/dL (ref 8.5–10.6)
Chloride: 94 mmol/L — ABNORMAL LOW (ref 98–107)
Creatinine: 2.33 mg/dL — ABNORMAL HIGH (ref 0.51–0.95)
GFR: 21 mL/min
Glucose: 93 mg/dL (ref 70–99)
Potassium: 4.1 mmol/L (ref 3.5–5.1)
Sodium: 137 mmol/L (ref 136–145)

## 2017-06-17 LAB — PHOSPHORUS, BLOOD: Phosphorous: 6 mg/dL — ABNORMAL HIGH (ref 2.7–4.5)

## 2017-06-17 LAB — MAGNESIUM, BLOOD: Magnesium: 2.2 mg/dL (ref 1.6–2.4)

## 2017-06-17 MED ORDER — DOPAMINE IN D5W 1.6-5 MG/ML-% IV SOLN
2.00 ug/kg/min | INTRAVENOUS | Status: DC
Start: 2017-06-17 — End: 2017-07-15
  Administered 2017-06-17 – 2017-06-21 (×8): 2 ug/kg/min via INTRAVENOUS
  Administered 2017-06-22: 3 ug/kg/min via INTRAVENOUS
  Administered 2017-06-22: 2 ug/kg/min via INTRAVENOUS
  Administered 2017-06-23 – 2017-06-26 (×10): 3 ug/kg/min via INTRAVENOUS
  Administered 2017-06-27 – 2017-07-14 (×40): 2 ug/kg/min via INTRAVENOUS
  Filled 2017-06-17 (×14): qty 250

## 2017-06-17 MED ORDER — CHLOROTHIAZIDE SODIUM 500 MG IV SOLR
500.00 mg | Freq: Once | INTRAVENOUS | Status: AC
Start: 2017-06-17 — End: 2017-06-17
  Administered 2017-06-17 (×2): 500 mg via INTRAVENOUS
  Filled 2017-06-17: qty 18

## 2017-06-17 NOTE — Plan of Care (Signed)
Problem: Promotion of Health and Safety  Goal: Promotion of Health and Safety  Description  The patient remains safe, receives appropriate treatment and achieves optimal outcomes (physically, psychosocially, and spiritually) within the limitations of the disease process by discharge.    Information below is the current care plan.  Outcome: Progressing  Flowsheets  Taken 06/17/2017 0753 by Heather Roberts, RN  Patient /Family stated Goal: feel better  Taken 06/17/2017 0415 by Abel Presto, RN  Guidelines: Inpatient Nursing Guidelines  Taken 06/17/2017 1526 by Heather Roberts, RN  Individualized Interventions/Recommendations #1: Pain level assessed. Pt denies pain. States some dyspnea on exertion.  Individualized Interventions/Recommendations #2 (if applicable): Pt on 6389 ml fluid restriction. Pt complying. Daily weight being done.  Outcome Evaluation (rationale for progressing/not progressing) every shift: Pt states she will go for a walk with husband.  Note:    06/17/17  0413 06/17/17  0559 06/17/17  0753 06/17/17  1153   BP: 124/58  120/61 128/59   Pulse: 97 103 102 98   Resp: 18  20 20    Temp: 98.7 F (37.1 C)  98.3 F (36.8 C) 98.5 F (36.9 C)   SpO2: 95%  100% 100%

## 2017-06-17 NOTE — Interdisciplinary (Signed)
Pt up walking in hallway indep. Tolerated well. Back to chair.

## 2017-06-17 NOTE — Progress Notes (Signed)
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 failure on 3Lpm now here with worsening LEx edema, AKI, now on dopamine. Tried lasix gtt, now back to lasix bolus.     INTERVAL:  Diuresing well. In spite of tremendous diuresis of 26 liters, her weight has been largely unchanged. Because of this discordance, there is a concern for surreptitious water consumption. She does make a point of telling me unprompted each day that she's not drinking out of the sink.     MEDICATIONS  IV Drips   epoprostenol (FLOLAN) infusion 35 ng/kg/min (06/17/17 0400)    sodium chloride       Scheduled Medications   furosemide  100 mg 4x Daily    iron sucrose  200 mg Daily    medroxyPROGESTERone  1.25 mg Q24H NR    metolazone  5 mg Q24H    potassium chloride  40 mEq BID    Prostacyclin Cassette Change   Daily    Prostacyclin Tubing   Once per day on Mon Wed Fri    Prostacylin Batteries   Once per day on Mon    sevelamer carbonate  800 mg TID WC    sildenafil  80 mg TID    sodium chloride (PF)  3 mL Q8H    spironolactone  75 mg Daily     PRN Medications   acetaminophen  650 mg Q6H PRN 650 mg at 06/16/17 0339    loperamide  2 mg TID PRN 2 mg at 06/17/17 0659    nalOXone  0.1 mg Q2 Min PRN      simethicone  80 mg Q6H PRN 80 mg at 06/09/17 1851    sodium chloride (PF)  3 mL PRN 3 mL at 06/16/17 2058    sodium chloride   Continuous PRN       VITALS   Latest Entry  Range (last 24 hours)    Temperature: 98.3 F (36.8 C)  Temp  Avg: 98.3 F (36.8 C)  Min: 97.8 F (36.6 C)  Max: 98.6 F (37 C)    Blood pressure (BP): 120/61  BP  Min: 99/53  Max: 138/77    Heart Rate: 102  Pulse  Avg: 100.7  Min: 93  Max: 112    Respirations: 20  Resp  Avg: 19.4  Min: 18  Max: 20    SpO2: 100 %  SpO2  Avg: 97.6 %  Min: 95 %  Max: 100 %       No Data Recorded     Weight: 61.5 kg (135 lb 9.6  oz)  Percentage Weight Change (%): 0.67 %    Intake/Output       06/16/17 0600 - 06/17/17 0559 06/17/17 0600 - 06/18/17 0559      0600-1759 8921-1941 Total 0600-1759 7408-1448 Total       Intake    P.O.  1000  300 1300  --  -- --    Total Intake 1000 300 1300 -- -- --       Output    Urine  825  1300 2125  --  -- --    Total Output 825 1300 2125 -- -- --       Net I/O     175 -1000 -825 -- -- --  Physical Exam:  Gen: A&O, conversing comfortably  Eyes: EOMI, pupils equal, anicteric  HENT: Atraumatic, normocephalic  Neck: + JVD, trachea midline  CV: RRR, +murmur, accentuated S2, No R/G/S3. L tunneled IJ CVC site is CDI  Resp: stable bibasilar crackles, no intercostal retractions/normal effort  Abdo: NT mild D, soft, no masses.  Ext: 1+ BLEx edema, improving  Neuro: No focal weakness or sensory deficit, CN grossly intact  Psych: Appropriate affect and mood, good insight. AnOx3  Skin: Normal temperature, no rash or nodules. Diffuse erythema, likely related to IV Epo; + brusing.     Na 137 (06/05) CL 94* (06/05) BUN 116* (06/05) GLU   93 (06/05)   K 4.1 (06/05) CO2 26 (06/05) Cr 2.33* (06/05)      WBC 3.8* (06/05) HGB 8.4* (06/05) PLT 59* (06/05)    HCT 27.5* (06/05)      TP   ALT   TBILI   ALK PHOS      ALB   AST   DBILI        PT   PTT     INR       No results found for: ARTPH, ARTPCO2, ARTPO2    ASSESSMENT AND PLAN  67 yo F with WHO I PAH associated with SLE and prior anorexogen use admitted with acute on chronic right heart failure and volume overload as well as AKI and thrombocytopenia.Persistent edema.  Minimal progress on exam and weight despite negative 20 L by I/O.    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  K binder  - Will discuss changing diuretics to PO contingent on ongoing clinical response    This patient was seen and discussed with my attending Dr. [name].    Brynda Rim, MD  Fellow, Pulmonary and [facility]    Pulmonary Vascular Attending Addendum:  Patient was examined withDr. Artist Beach, imaging and tests reviewed.  Please see above note for full details and plan.    67 yo F with WHO I PAH associated with SLE and prior anorexogen use admitted with acute on chronic right heart failure and volume overload as well as AKI and thrombocytopenia.Wt plateau.  Exam worse.      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

## 2017-06-17 NOTE — Interdisciplinary (Signed)
Patient resting up in chair. Husband at bedside. No complaints. Sob with exertion. Feels like walking this pm. L/s diminished bilat. Flolan infusion intact. Avss.

## 2017-06-17 NOTE — Plan of Care (Signed)
Problem: Promotion of Health and Safety  Goal: Promotion of Health and Safety  Description  The patient remains safe, receives appropriate treatment and achieves optimal outcomes (physically, psychosocially, and spiritually) within the limitations of the disease process by discharge.    Information below is the current care plan.  Outcome: Progressing  Flowsheets  Taken 06/17/2017 0415 by Abel Presto, RN  Patient /Family stated Goal: rest  Guidelines: Inpatient Nursing Guidelines  Individualized Interventions/Recommendations #4 (if applicable): allow patient to verbalize frustrations and fears regarding hospital stay; pr verbalized understanding of plan of care, but frustrated with progress so far and inability to keep fluid off; encourage  Outcome Evaluation (rationale for progressing/not progressing) every shift: pt hemodynamically stable; ambulating independently in room and hallways with husband and calls appropriately for assistance; pt compliant with fluid restriction limit and verbalized understanding of plan of care; hopeful that she will return home soon  Taken 06/14/2017 1710 by Vinie Sill, RN  Individualized Interventions/Recommendations #1: Monitor strict I/O's to assess fluid balance, maintain 1500 ml fluid restriction  Individualized Interventions/Recommendations #3 (if applicable): Administer diuretics as ordered and encourage pt to ambulate and elevate BLE when supine to help improve edema/ascites  Taken 06/16/2017 0128 by Abel Presto, RN  Individualized Interventions/Recommendations #2 (if applicable): cluster care to minimize interruption and promote restful sleep; educate patient to notify nurse for any assistance needed overnight

## 2017-06-18 LAB — BASIC METABOLIC PANEL, BLOOD
Anion Gap: 18 mmol/L — ABNORMAL HIGH (ref 7–15)
BUN: 115 mg/dL — ABNORMAL HIGH (ref 8–23)
Bicarbonate: 24 mmol/L (ref 22–29)
Calcium: 9 mg/dL (ref 8.5–10.6)
Chloride: 91 mmol/L — ABNORMAL LOW (ref 98–107)
Creatinine: 2.51 mg/dL — ABNORMAL HIGH (ref 0.51–0.95)
GFR: 19 mL/min
Glucose: 87 mg/dL (ref 70–99)
Potassium: 3.6 mmol/L (ref 3.5–5.1)
Sodium: 133 mmol/L — ABNORMAL LOW (ref 136–145)

## 2017-06-18 LAB — MAGNESIUM, BLOOD: Magnesium: 2.1 mg/dL (ref 1.6–2.4)

## 2017-06-18 LAB — PHOSPHORUS, BLOOD: Phosphorous: 5.7 mg/dL — ABNORMAL HIGH (ref 2.7–4.5)

## 2017-06-18 NOTE — Progress Notes (Signed)
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 failure on 3Lpm now here with worsening LEx edema, AKI, now on dopamine. Tried lasix gtt, now back to lasix bolus.     INTERVAL:  Diuresing well. In spite of tremendous diuresis of 27 liters, her weight has been largely unchanged. She remains uncertain as to why her weight isn't changing. She and her husband had many questions about how fluid shifts occur and just where the water might be going.     MEDICATIONS  IV Drips   DOPamine 2 mcg/kg/min (06/18/17 0819)    epoprostenol (FLOLAN) infusion 35 ng/kg/min (06/18/17 0821)    sodium chloride       Scheduled Medications   furosemide  100 mg 4x Daily    iron sucrose  200 mg Daily    medroxyPROGESTERone  1.25 mg Q24H NR    metolazone  5 mg Q24H    Prostacyclin Cassette Change   Daily    Prostacyclin Tubing   Once per day on Mon Wed Fri    Prostacylin Batteries   Once per day on Mon    sevelamer carbonate  800 mg TID WC    sildenafil  80 mg TID    sodium chloride (PF)  3 mL Q8H    spironolactone  75 mg Daily     PRN Medications   acetaminophen  650 mg Q6H PRN 650 mg at 06/16/17 0339    loperamide  2 mg TID PRN 2 mg at 06/18/17 1118    nalOXone  0.1 mg Q2 Min PRN      simethicone  80 mg Q6H PRN 80 mg at 06/09/17 1851    sodium chloride (PF)  3 mL PRN 3 mL at 06/16/17 2058    sodium chloride   Continuous PRN       VITALS   Latest Entry  Range (last 24 hours)    Temperature: 98.1 F (36.7 C)  Temp  Avg: 98.3 F (36.8 C)  Min: 97.8 F (36.6 C)  Max: 98.6 F (37 C)    Blood pressure (BP): 133/70  BP  Min: 99/53  Max: 138/77    Heart Rate: 102  Pulse  Avg: 100.7  Min: 93  Max: 112    Respirations: 18  Resp  Avg: 19.4  Min: 18  Max: 20    SpO2: 98 %  SpO2  Avg: 97.6 %  Min: 95 %  Max: 100 %       No Data Recorded     Weight: 62.3 kg (137 lb 6.4 oz)  Percentage  Weight Change (%): -0.07 %    Intake/Output       06/17/17 0600 - 06/18/17 0559 06/18/17 0600 - 06/19/17 0559      2130-8657 8469-6295 Total 0600-1759 2841-3244 Total       Intake    P.O.  450  100 550  120  -- 120    I.V.  42.4  106.1 148.5  120  -- 120    Total Intake 492.4 206.1 698.5 240 -- 240       Output    Urine  825  1000 1825  750  -- 750    Total Output 825 1000 1825 750 -- 750       Net I/O     -  332.6 -793.9 -1126.5 -510 -- -510        Physical Exam:  Gen: A&O, conversing comfortably  Eyes: EOMI, pupils equal, anicteric  HENT: Atraumatic, normocephalic  Neck: + JVD, trachea midline  CV: RRR, +murmur, accentuated S2, No R/G/S3. L tunneled IJ CVC site is CDI  Resp: stable bibasilar crackles, no intercostal retractions/normal effort  Abdo: NT mild D, soft, no masses.  Ext: 1+ BLEx edema, improving  Neuro: No focal weakness or sensory deficit, CN grossly intact  Psych: Appropriate affect and mood, good insight. AnOx3  Skin: Normal temperature, no rash or nodules. Diffuse erythema, likely related to IV Epo; + brusing.     Na 133* (06/06) CL 91* (06/06) BUN 115* (06/06) GLU   87 (06/06)   K 3.6 (06/06) CO2 24 (06/06) Cr 2.51* (06/06)      WBC 3.8* (06/05) HGB 8.4* (06/05) PLT 59* (06/05)    HCT 27.5* (06/05)      TP   ALT   TBILI   ALK PHOS      ALB   AST   DBILI        PT   PTT     INR       No results found for: ARTPH, ARTPCO2, ARTPO2    ASSESSMENT AND PLAN  67 yo F with WHO I PAH associated with SLE and prior anorexogen use admitted with acute on chronic right heart failure and volume overload as well as AKI and thrombocytopenia.Persistent edema.  Minimal progress on exam and weight despite negative 20 L by I/O.    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 PBettey Mare    BBrynda Rim MD  Fellow, [facility]monary [facility]       Pulmonary Vascular Attending Addendum:  Patient was examined withDr.Nokes  Labs, imaging and tests reviewed.  Please see above note for full details and plan.    67yo F with WHO I PAH associated with SLE and prior anorexogen use admitted with acute on chronic right heart failure and volume overload as well as AKI and thrombocytopenia.Wt worse.  Exam worse.  Despite restarting dopamine yesterday and additional dose of diuril.    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

## 2017-06-18 NOTE — Progress Notes (Signed)
NEPHROLOGY PROGRESS NOTE    Chief Complaint: Edema (C/O SWELLING TO BOTH FEET FOR THE LAST 5 DAYS.  C/O ABD BLOATING.  DC'D FROM A HOSP IN [place] FOR DIARRHEA.  TAKEN OFF LASIX FOR THE LAST 5 DAYS.  [place].  PT IS A FLOLAN PT.)  Reason for Consultation: AKI evaluation  Hospital Day #34 Days 12 Hours  PrimaryTeam/ Attending Provider: Larna Daughters., MD    ID/ Assessment:   Diana Chambers is a 67 year old female w/ PMH of hx of Group 1 PAH associated with SLE and /Sjogren's disease, presented to the hospital with worsening legs swelling. 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   _____________________________________________________________________________    Plan (Today's recommendations in bold):  # ?AKI on CKD in the setting of PAH/Rt sided HF, responding well to diuretics now but still with discordant I/Os and weight. UOP initially declined after stopping diuril but increased again yesterday  -sCr uptrending.  -Estimated dry weight ~129 lb, possibly lower d/t prolonged hospitalization and loss of muscle weight.  -Defer to pulmonary team regarding transitioning lasix from IV to PO.  -06/02 explained in detail the CKD stage and her risk of progression to ESRD- pt mentioned she wouldn't want dialysis if it got to that.    # Lytes: Hyponatremia improved while diuresing. K at goal.   # Volume: Hypervolemic Diuresis regimen: lasix, spironolactone, metolazone.  # Acid-Base: bicarb trended up while diuresing but stable, CTM  # MBD: phos elevated- Renvela 800 mg TID WM + recommend renal diet or at least low phosphorus diet.  # Anemia: Iron studies with low iron sat ~ 16, Iron sucrose 200 mg IV qday x5 doses (end 06/09), started Epogen 4000U SQ qweekly (ordered as a courtesy).  # Dispo: f/u on discharge w/ Dr. Donnelly Stager in renal clinic.    Case seen and discussed with Dr. Cindie Laroche.  See  attending addendum for changes in above plan.    Bonnita Levan, MD  Nephrology Fellow, PGY 4  Division of Nephrology-Hypertension   [facility]  [address] [place], [place] [address]  Pager: [phone]  _____________________________________________________________________________    Interval Events: [place]- IR consult for therapeutic paracentesis.  Subjective: Denies CP, SOB. Swelling same. Reports urine output the same.  Review of Systems: A full ROS performed and negative outside above subjective history.  _____________________________________________________________________________    Physical Exam:  General: NAD, stting in chair,  HEENT: NC/AT  Eyes: Anicetric sclera  Mouth: MMM  CV: RRR  Pulm: Good air entry, CTAB, no crackles   Abd:  Soft, non tender,  distended  GU: No foley  Ext: Stable 2-3 mm edema on both LE.    24hour Vital Signs:  Temperature:  [98.1 F (36.7 C)-98.8 F (37.1 C)] 98.7 F (37.1 C) (06/07 0743)  Blood pressure (BP): (109-133)/(58-74) 129/64 (06/07 0743)  Heart Rate:  [96-114] 114 (06/07 0743)  Respirations:  [18-20] 18 (06/07 0743)  Pain Score: 0 (06/07 0743)  O2 Device: Nasal cannula (06/07 0533)  O2 Flow Rate (L/min):  [3 l/min] 3 l/min (06/07 0533)  SpO2:  [95 %-99 %] 99 % (06/07 0743)    Most Recent Vital Signs:   06/18/17  2134 06/19/17  0010 06/19/17  0533 06/19/17  0743   BP: 116/65 109/61 116/74 129/64   Pulse: 101 104 96  114   Resp:  20 18 18    Temp:  98.8 F (37.1 C) 98.4 F (36.9 C) 98.7 F (37.1 C)   SpO2:  95% 98% 99%     Intake/Output:  06/06 0600 - 06/07 0559  In: 1090.6 [P.O.:750; I.V.:340.6]  Out: 1850 [Urine:1850]  Net: -759.4  06/18/2017 0600 - 06/19/2017 0600  Urine x0  Stool x1  Emesis x0    Wt Readings from Last 3 Encounters:   06/19/17 62.9 kg (138 lb 10.7 oz)   01/21/17 58.9 kg (129 lb 14.4 oz)   10/15/16 67.1 kg (148 lb)     Diet Regular; 156ml Volume  Restriction  _____________________________________________________________________________    Labs reviewed; significant for:  Recent Labs     06/17/17  0615 06/18/17  0606 06/19/17  0608   NA 137 133* 135*   K 4.1 3.6 3.3*   CL 94* 91* 90*   BICARB 26 24 28    BUN 116* 115* 117*   CREAT 2.33* 2.51* 2.75*   GFRNON 21 19 17    GLU 93 87 91   ANION 17* 18* 17*   Port  9.0 9.0 9.0   MG 2.2 2.1 2.2   PHOS 6.0* 5.7* 6.4*     Recent Labs     06/17/17  0611 06/19/17  0608   WBC 3.8* 3.9*   HGB 8.4* 8.1*   HCT 27.5* 26.7*   PLT 59* 73*   _____________________________________________________________________________  Microbiology  Lab Results   Component Value Date    BCRES No Growth 05/16/2017     Lab Results   Component Value Date    URINECULTURE (A) 06/11/2016     Staphylococcus aureus  >100,000 colonies/mL  Two colony types; same susceptibility pattern  Identification performed by [employer]( [facility]). This test  was developed and its performance characteristics determined by [facility]. It has not been cleared or  approved by the U.S. Food and Drug Administration.  The FDA has  determined that such clearance or approval is not necessary.       Lab Results   Component Value Date    GSRES  02/22/2013     Rare Gram positive cocci  Rare yeast  (Performed at [facility]       Lab Results   Component Value Date    CXRES Moderate Normal Respiratory Flora (A) 06/15/2011    CXRES Moraxella (Branhamella) catarrhalis  Heavy 06/15/2011    CXRES Streptococcus group C  Rare 06/15/2011     _____________________________________________________________________________  Medications reviewed   furosemide  100 mg 4x Daily    iron sucrose  200 mg Daily    medroxyPROGESTERone  1.25 mg Q24H NR    methylPREDNISolone sodium succinate  30 mg Daily    metolazone  5 mg Q24H    Prostacyclin Cassette Change   Daily    Prostacyclin Tubing   Once per day on Mon Wed Fri    Prostacylin Batteries   Once per day  on Mon    sevelamer carbonate  800 mg TID WC    sildenafil  80 mg TID    sodium chloride (PF)  3 mL Q8H    spironolactone  75 mg Daily      DOPamine 2 mcg/kg/min (06/18/17 1900)    epoprostenol (FLOLAN) infusion 35 ng/kg/min (06/18/17 1900)    sodium chloride        acetaminophen  650 mg Q6H PRN    loperamide  2 mg TID PRN  nalOXone  0.1 mg Q2 Min PRN    simethicone  80 mg Q6H PRN    sodium chloride (PF)  3 mL PRN    sodium chloride   Continuous PRN     _____________________________________________________________________________  Imaging reviewed  US Abdomen Limited  Narrative: EXAM DESCRIPTION:  US ABDOMEN LIMITED    CLINICAL HISTORY:  Evaluate for ascites    TECHNIQUE:  Targeted grayscale ultrasound of the 4 abdominal quadrants and pelvis for evaluation of ascites.    COMPARISON:  Abdominal ultrasound 05/29/2016    FINDINGS:  Targeted ultrasound of the 4 abdominal quadrants and pelvis was performed, which demonstrates large volume ascites, with the largest vertical pocket in the left lower quadrant measuring up to 12.5 cm.    CONCURRENT SUPERVISION:  I have reviewed the images and agree with the resident's interpretation.           Preliminary created by: Warnell Bureau   Signed byFredric Dine 06/17/2017 20:12:11  Impression: IMPRESSION:  Large volume ascites.    _____________________________________________________________________________  Allergies  Allergies   Allergen Reactions    Allopurinol Hives    Levaquin Swelling and Other     Severe joint pain      Ofloxacin Nausea and Vomiting    Chlorhexidine Itching    Sulfa Drugs Unspecified     _____________________________________________________________________________

## 2017-06-18 NOTE — Plan of Care (Signed)
Problem: Promotion of Health and Safety  Goal: Promotion of Health and Safety  Description  The patient remains safe, receives appropriate treatment and achieves optimal outcomes (physically, psychosocially, and spiritually) within the limitations of the disease process by discharge.    Information below is the current care plan.  Outcome: Progressing  Flowsheets  Taken 06/17/2017 2000 by Ezra Sites, RN  Patient /Family stated Goal: Lose fluid  Taken 06/17/2017 0415 by Abel Presto, RN  Guidelines: Inpatient Nursing Guidelines  Individualized Interventions/Recommendations #4 (if applicable): allow patient to verbalize frustrations and fears regarding hospital stay; pr verbalized understanding of plan of care, but frustrated with progress so far and inability to keep fluid off; encourage  Taken 06/17/2017 1526 by Heather Roberts, RN  Individualized Interventions/Recommendations #1: Pain level assessed. Pt denies pain. States some dyspnea on exertion.  Individualized Interventions/Recommendations #2 (if applicable): Pt on 3888 ml fluid restriction. Pt complying. Daily weight being done.  Taken 06/14/2017 1710 by Vinie Sill, RN  Individualized Interventions/Recommendations #3 (if applicable): Administer diuretics as ordered and encourage pt to ambulate and elevate BLE when supine to help improve edema/ascites

## 2017-06-19 LAB — CBC WITH DIFF, BLOOD
ANC-Automated: 2.6 10*3/uL (ref 1.6–7.0)
Abs Basophils: 0 10*3/uL (ref <0.1)
Abs Eosinophils: 0 10*3/uL (ref 0.1–0.5)
Abs Lymphs: 0.9 10*3/uL (ref 0.8–3.1)
Abs Monos: 0.3 10*3/uL (ref 0.2–0.8)
Basophils: 1 %
Eosinophils: 1 %
Hct: 26.7 % — ABNORMAL LOW (ref 34.0–45.0)
Hgb: 8.1 gm/dL — ABNORMAL LOW (ref 11.2–15.7)
Imm Gran %: 3 % — ABNORMAL HIGH (ref <1)
Imm Gran Abs: 0.1 10*3/uL (ref <0.1)
Lymphocytes: 23 %
MCH: 25.1 pg — ABNORMAL LOW (ref 26.0–32.0)
MCHC: 30.3 g/dL — ABNORMAL LOW (ref 32.0–36.0)
MCV: 82.7 um3 (ref 79.0–95.0)
Monocytes: 8 %
Plt Count: 73 10*3/uL — ABNORMAL LOW (ref 140–370)
RBC: 3.23 10*6/uL — ABNORMAL LOW (ref 3.90–5.20)
RDW: 18.5 % — ABNORMAL HIGH (ref 12.0–14.0)
Segs: 65 %
WBC: 3.9 10*3/uL — ABNORMAL LOW (ref 4.0–10.0)

## 2017-06-19 LAB — BASIC METABOLIC PANEL, BLOOD
Anion Gap: 17 mmol/L — ABNORMAL HIGH (ref 7–15)
Anion Gap: 20 mmol/L — ABNORMAL HIGH (ref 7–15)
BUN: 117 mg/dL — ABNORMAL HIGH (ref 8–23)
BUN: 119 mg/dL — ABNORMAL HIGH (ref 8–23)
Bicarbonate: 28 mmol/L (ref 22–29)
Bicarbonate: 25 mmol/L (ref 22–29)
Calcium: 9 mg/dL (ref 8.5–10.6)
Calcium: 8.8 mg/dL (ref 8.5–10.6)
Chloride: 90 mmol/L — ABNORMAL LOW (ref 98–107)
Chloride: 88 mmol/L — ABNORMAL LOW (ref 98–107)
Creatinine: 2.75 mg/dL — ABNORMAL HIGH (ref 0.51–0.95)
Creatinine: 2.56 mg/dL — ABNORMAL HIGH (ref 0.51–0.95)
GFR: 17 mL/min
GFR: 19 mL/min
Glucose: 91 mg/dL (ref 70–99)
Glucose: 192 mg/dL — ABNORMAL HIGH (ref 70–99)
Potassium: 3.3 mmol/L — ABNORMAL LOW (ref 3.5–5.1)
Potassium: 3.8 mmol/L (ref 3.5–5.1)
Sodium: 135 mmol/L — ABNORMAL LOW (ref 136–145)
Sodium: 133 mmol/L — ABNORMAL LOW (ref 136–145)

## 2017-06-19 LAB — PHOSPHORUS, BLOOD
Phosphorous: 6.4 mg/dL — ABNORMAL HIGH (ref 2.7–4.5)
Phosphorous: 6.5 mg/dL — ABNORMAL HIGH (ref 2.7–4.5)

## 2017-06-19 LAB — MAGNESIUM, BLOOD
Magnesium: 2.2 mg/dL (ref 1.6–2.4)
Magnesium: 2.2 mg/dL (ref 1.6–2.4)

## 2017-06-19 MED ORDER — EPOETIN ALFA-EPBX 4000 UNIT/ML IJ SOLN
4000.00 [IU] | INTRAMUSCULAR | Status: DC
Start: 2017-06-19 — End: 2017-07-15
  Administered 2017-06-19 – 2017-07-10 (×4): 4000 [IU] via SUBCUTANEOUS
  Filled 2017-06-19 (×4): qty 1

## 2017-06-19 MED ORDER — METHYLPREDNISOLONE SODIUM SUCC 40 MG IJ SOLR
40.00 mg | Freq: Every day | INTRAMUSCULAR | Status: DC
Start: 2017-06-19 — End: 2017-06-19
  Filled 2017-06-19: qty 40

## 2017-06-19 MED ORDER — METHYLPREDNISOLONE SODIUM SUCC 40 MG IJ SOLR
30.00 mg | Freq: Every day | INTRAMUSCULAR | Status: DC
Start: 2017-06-19 — End: 2017-06-22
  Administered 2017-06-19 – 2017-06-22 (×4): 30 mg via INTRAVENOUS
  Filled 2017-06-19 (×4): qty 40

## 2017-06-19 NOTE — Plan of Care (Signed)
Problem: Promotion of Health and Safety  Goal: Promotion of Health and Safety  Description  The patient remains safe, receives appropriate treatment and achieves optimal outcomes (physically, psychosocially, and spiritually) within the limitations of the disease process by discharge.    Information below is the current care plan.  Outcome: Progressing  Flowsheets  Taken 06/18/2017 2142  Patient /Family stated Goal: get rest  Taken 06/19/2017 0308  Guidelines: Inpatient Nursing Guidelines  Individualized Interventions/Recommendations #1: Communicate POC and diet/fluid restriction  Individualized Interventions/Recommendations #2 (if applicable): Cluster care to promote rest  Individualized Interventions/Recommendations #3 (if applicable): Encourage mobility and ambulation while awake. Educate on importance of calling before getting up.  Outcome Evaluation (rationale for progressing/not progressing) every shift: Pt resting comfortably in bed. Verbalizes understanding of POC and fluid restriction. Calls appropriately before getting up. Pt ambulating with 1 person assist- steady gait

## 2017-06-19 NOTE — Plan of Care (Signed)
Problem: Promotion of Health and Safety  Goal: Promotion of Health and Safety  Description  The patient remains safe, receives appropriate treatment and achieves optimal outcomes (physically, psychosocially, and spiritually) within the limitations of the disease process by discharge.    Information below is the current care plan.  Outcome: Progressing  Flowsheets  Taken 06/19/2017 0726 by Emelia Salisbury, RN  Patient /Family stated Goal: to feel better  Taken 06/19/2017 1822 by Emelia Salisbury, RN  Guidelines: Inpatient Nursing Guidelines  Taken 06/19/2017 0308 by Dorothyann Gibbs, RN  Individualized Interventions/Recommendations #1: Communicate POC and diet/fluid restriction  Individualized Interventions/Recommendations #2 (if applicable): Cluster care to promote rest  Individualized Interventions/Recommendations #3 (if applicable): Encourage mobility and ambulation while awake. Educate on importance of calling before getting up.  Outcome Evaluation (rationale for progressing/not progressing) every shift: Pt resting comfortably in bed. Verbalizes understanding of POC and fluid restriction. Calls appropriately before getting up. Pt ambulating with 1 person assist- steady gait  Taken 06/17/2017 0415 by Abel Presto, RN  Individualized Interventions/Recommendations #4 (if applicable): allow patient to verbalize frustrations and fears regarding hospital stay; pr verbalized understanding of plan of care, but frustrated with progress so far and inability to keep fluid off; encourage  Taken 06/11/2017 0542 by Scheryl Darter, RN  Individualized Interventions/Recommendations #5 (if applicable): electroylte replacement therapy  Note:   A&Ox4, VSS. Pt denies pain, sob or dizziness. Pt  Ambulates in a room without signes of distress. Pt is started on Solumedrol. Pt denies any side effects. Pt is educated to call for assistance, Call light within reach. Pt is updated on POC regularly. Hourly and prn rounds done, needs  assessed in timely manner.

## 2017-06-19 NOTE — Progress Notes (Signed)
[facility] 34 Days 12 Hours  Subjective:   Worsening edema and increased weight despite IV dopamine, lasix QID and metolazone.      Medications:  Scheduled Meds   furosemide  100 mg 4x Daily    iron sucrose  200 mg Daily    medroxyPROGESTERone  1.25 mg Q24H NR    methylPREDNISolone sodium succinate  30 mg Daily    metolazone  5 mg Q24H    Prostacyclin Cassette Change   Daily    Prostacyclin Tubing   Once per day on Mon Wed Fri    Prostacylin Batteries   Once per day on Mon    sevelamer carbonate  800 mg TID WC    sildenafil  80 mg TID    sodium chloride (PF)  3 mL Q8H    spironolactone  75 mg Daily     PRN Meds   acetaminophen  650 mg Q6H PRN    loperamide  2 mg TID PRN    nalOXone  0.1 mg Q2 Min PRN    simethicone  80 mg Q6H PRN    sodium chloride (PF)  3 mL PRN    sodium chloride   Continuous PRN     IV Meds   DOPamine 2 mcg/kg/min (06/18/17 1900)    epoprostenol (FLOLAN) infusion 35 ng/kg/min (06/18/17 1900)    sodium chloride         Vitals:    Latest Entry  Range (last 24 hours)    Temperature: 98.7 F (37.1 C)  Temp  Avg: 98.6 F (37 C)  Min: 98.1 F (36.7 C)  Max: 98.8 F (37.1 C)    Blood pressure (BP): 129/64  BP  Min: 109/61  Max: 133/70    Heart Rate: 114  Pulse  Avg: 103.1  Min: 96  Max: 114    Respirations: 18  Resp  Avg: 18.7  Min: 18  Max: 20    SpO2: 99 %  SpO2  Avg: 97.8 %  Min: 95 %  Max: 99 %       No data recorded     Weight: 62.9 kg (138 lb 10.7 oz)  Percentage Weight Change (%): 0.92 %    V       Intake/Output Summary (Last 24 hours) at 06/19/2017 1113  Last data filed at 06/19/2017 0827  Gross per 24 hour   Intake 968.64 ml   Output 1350 ml   Net -381.36 ml       Exam:   Gen: NAD, comfortable in chair  NECK: + JVD  CV: RRR, + TR no S3  RESP: Rales at bases bilat  ABD: distended + ascites  EXT:2+ LE edema  NEURO: non-focal, grossly intact    Labs:   CBC  Recent Labs     06/17/17  0611 06/19/17  0608   WBC 3.8* 3.9*   HGB 8.4*  8.1*   HCT 27.5* 26.7*   PLT 59* 73*   SEG 68 65   LYMPHS 23 23   MONOS 3 8      Chemistry  Recent Labs     06/18/17  0606 06/19/17  0608   NA 133* 135*   K 3.6 3.3*   CL 91* 90*   BICARB 24 28   BUN 115* 117*   CREAT 2.51* 2.75*   GLU 87 91   [place] 9.0 9.0   MG 2.1 2.2   PHOS 5.7* 6.4*  ASSESSMENT/PLAN:  67 yo F with WHO I PAH associated with SLE/sjogrens, LIP and prior anorexogen use admitted with acute on chronic right heart failure and volume overload as well as AKI and thrombocytopenia.    Wt worse. Exam worse deespite restarting dopamine and ongoing rx with lasix IV, metolazone.  Cr worse.    During prior hospitalization in 2018 had dramatic improvement in edema and efficacy of diuretics with administration of steroids.  Etiology unclear.    Again earlier in this hospitalization had sudden improvement in weight and efficacy of diuretics after 3 days of solumedrol 30 mg.    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  Pulmonary and Critical Care Attending  Pager [phone]

## 2017-06-20 DIAGNOSIS — R0602 Shortness of breath: Secondary | ICD-10-CM

## 2017-06-20 DIAGNOSIS — R188 Other ascites: Secondary | ICD-10-CM

## 2017-06-20 LAB — PHOSPHORUS, BLOOD: Phosphorous: 7 mg/dL — ABNORMAL HIGH (ref 2.7–4.5)

## 2017-06-20 LAB — BASIC METABOLIC PANEL, BLOOD
Anion Gap: 18 mmol/L — ABNORMAL HIGH (ref 7–15)
BUN: 121 mg/dL — ABNORMAL HIGH (ref 8–23)
Bicarbonate: 27 mmol/L (ref 22–29)
Calcium: 9.1 mg/dL (ref 8.5–10.6)
Chloride: 88 mmol/L — ABNORMAL LOW (ref 98–107)
Creatinine: 2.66 mg/dL — ABNORMAL HIGH (ref 0.51–0.95)
GFR: 18 mL/min
Glucose: 115 mg/dL — ABNORMAL HIGH (ref 70–99)
Potassium: 3.9 mmol/L (ref 3.5–5.1)
Sodium: 133 mmol/L — ABNORMAL LOW (ref 136–145)

## 2017-06-20 LAB — MAGNESIUM, BLOOD: Magnesium: 2.3 mg/dL (ref 1.6–2.4)

## 2017-06-20 NOTE — Progress Notes (Signed)
PULMONARY/CRITICAL CARE ATTENDING NOTE  Current Hospital 67 Days 16 Hours     Subjective:   No acute issues overnight. Difficulty voiding - foley placed.    Medications:  Scheduled Meds   epoetin alfa-epbx  4,000 Units Once per day on Fri    furosemide  100 mg 4x Daily    medroxyPROGESTERone  1.25 mg Q24H NR    methylPREDNISolone sodium succinate  30 mg Daily    metolazone  5 mg Q24H    Prostacyclin Cassette Change   Daily    Prostacyclin Tubing   Once per day on Mon Wed Fri    Prostacylin Batteries   Once per day on Mon    sevelamer carbonate  800 mg TID WC    sildenafil  80 mg TID    sodium chloride (PF)  3 mL Q8H    spironolactone  75 mg Daily     PRN Meds   acetaminophen  650 mg Q6H PRN    loperamide  2 mg TID PRN    nalOXone  0.1 mg Q2 Min PRN    simethicone  80 mg Q6H PRN    sodium chloride (PF)  3 mL PRN    sodium chloride   Continuous PRN     IV Meds   DOPamine 2 mcg/kg/min (06/19/17 2031)    epoprostenol (FLOLAN) infusion 35 ng/kg/min (06/20/17 1328)    sodium chloride         Vitals:    Latest Entry  Range (last 24 hours)    Temperature: 98 F (36.7 C)  Temp  Avg: 98.6 F (37 C)  Min: 98.1 F (36.7 C)  Max: 98.8 F (37.1 C)    Blood pressure (BP): 134/72  BP  Min: 109/61  Max: 133/70    Heart Rate: 80  Pulse  Avg: 103.1  Min: 96  Max: 114    Respirations: 18  Resp  Avg: 18.7  Min: 18  Max: 20    SpO2: 98 %  SpO2  Avg: 97.8 %  Min: 95 %  Max: 99 %       No data recorded     Weight: 64 kg (141 lb 1.5 oz)  Percentage Weight Change (%): 1.75 %    V       Intake/Output Summary (Last 24 hours) at 06/19/2017 1113  Last data filed at 06/19/2017 0827  Gross per 24 hour   Intake 968.64 ml   Output 1350 ml   Net -381.36 ml       Exam:   Gen: NAD, comfortable in chair  NECK: + JVD  CV: RRR, + TR no S3  RESP: Rales at bases bilat  ABD: distended + ascites  EXT:2+ LE edema  NEURO: non-focal, grossly intact    Labs:   CBC  Recent Labs     06/17/17  0611 06/19/17  0608   WBC 3.8* 3.9*   HGB 8.4* 8.1*     HCT 27.5* 26.7*   PLT 59* 73*   SEG 68 65   LYMPHS 23 23   MONOS 3 8      Chemistry  Recent Labs     06/18/17  0606 06/19/17  0608   NA 133* 135*   K 3.6 3.3*   CL 91* 90*   BICARB 24 28   BUN 115* 117*   CREAT 2.51* 2.75*   GLU 87 91   [place] 9.0 9.0   MG 2.1 2.2   PHOS 5.7* 6.4*  ASSESSMENT/PLAN:  67 yo F with WHO I PAH associated with SLE/sjogrens, LIP and prior anorexogen use admitted with acute on chronic right heart failure and volume overload as well as AKI and thrombocytopenia.    Wt worse. Exam worse despite restarting dopamine and ongoing rx with lasix IV, metolazone.  Cr worse.    During prior hospitalization in 2018 had dramatic improvement in edema and efficacy of diuretics with administration of steroids.  Etiology unclear.    Again earlier in this hospitalization had sudden improvement in weight and efficacy of diuretics after 3 days of solumedrol 30 mg.    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  # AKI on CKD, baseline around 1.3  # Thrombocytopenia: stable  # Chronic Hypoxia on home oxygen    - continue solumedrol 30 mg QDAY (day 2 of 4) and monitor for effect on diuretic efficacy/weight loss after 3-4 days of steroids will d/c  - If no change in clinical status with steroids will have IR perform paracentesis  - Lasix 100 mg QID  - Cont dopamine 2 mcg/kg/min  - Metolazone 5mg  qday  - Continue Spironolactone  - Continue home sildenafil and flolan at 35 ng/kg/min  - Continue oxygen suppl  - Fluid restrict to 1.5 L/day  - Iron Sucrose for iron def anemia    This patient was seen and discussed with my attending Dr. Maudie Mercury.    Brynda Rim, MD  Fellow, Pulmonary and [facility]    Attending Addendum:  I examined the patient and reviewed the interval labs/results.  I discussed with Dr. Jacky Kindle and agree with above.  New foley for closer I/O monitoring.  Still with JVD, edema, ascites.  RRR without S3.  CL site and PIVs intact and clean.  CPM.  All  questions answered.    Aretta Nip, M.D.  Pulmonary Hypertension/PTE Attending  PID# [id number], Pager [phone]

## 2017-06-20 NOTE — Plan of Care (Signed)
Problem: Promotion of Health and Safety  Goal: Promotion of Health and Safety  Description  The patient remains safe, receives appropriate treatment and achieves optimal outcomes (physically, psychosocially, and spiritually) within the limitations of the disease process by discharge.    Information below is the current care plan.  Outcome: Progressing  Flowsheets  Taken 06/19/2017 2111  Patient /Family stated Goal: to feel better  Taken 06/20/2017 0128  Guidelines: Inpatient Nursing Guidelines  Individualized Interventions/Recommendations #4 (if applicable): Encourage pt to verbalize feelings about hospital stay to aid with coping  Outcome Evaluation (rationale for progressing/not progressing) every shift: Pt resting comfortably in bed but frustrated and sad about disagreement with husband yesterday. Pt able to verbalize feelings and is hopeful. Ambulates with 1 person assist. Calls appropriately  Taken 06/19/2017 0308  Individualized Interventions/Recommendations #1: Communicate POC and diet/fluid restriction  Individualized Interventions/Recommendations #2 (if applicable): Cluster care to promote rest  Individualized Interventions/Recommendations #3 (if applicable): Encourage mobility and ambulation while awake. Educate on importance of calling before getting up.

## 2017-06-20 NOTE — Plan of Care (Signed)
Problem: Promotion of Health and Safety  Goal: Promotion of Health and Safety  Description  The patient remains safe, receives appropriate treatment and achieves optimal outcomes (physically, psychosocially, and spiritually) within the limitations of the disease process by discharge.    Information below is the current care plan.  Outcome: Progressing  Flowsheets  Taken 06/20/2017 6761 by Emelia Salisbury, RN  Patient /Family stated Goal: to get the fluids out  Taken 06/20/2017 1542 by Emelia Salisbury, RN  Guidelines: Inpatient Nursing Guidelines  Outcome Evaluation (rationale for progressing/not progressing) every shift: Pt is most of the time in a chair, pt is more calm than yesterday. Verbalizes understanding of Tx plan and her medications. Calls for assistance and if she has questions. Uses the call light.  Taken 06/19/2017 0308 by Dorothyann Gibbs, RN  Individualized Interventions/Recommendations #1: Communicate POC and diet/fluid restriction  Individualized Interventions/Recommendations #2 (if applicable): Cluster care to promote rest  Individualized Interventions/Recommendations #3 (if applicable): Encourage mobility and ambulation while awake. Educate on importance of calling before getting up.  Taken 06/20/2017 0128 by Dorothyann Gibbs, RN  Individualized Interventions/Recommendations #4 (if applicable): Encourage pt to verbalize feelings about hospital stay to aid with coping  Taken 06/11/2017 0542 by Scheryl Darter, RN  Individualized Interventions/Recommendations #5 (if applicable): electroylte replacement therapy

## 2017-06-20 NOTE — Progress Notes (Signed)
NEPHROLOGY FELLOW FOLLOW UP NOTE    ID: 67 y/o/f pmh PAH, Sjogren's disease and CKD here with AKI and worsening volume overload    EVENTS/SUBJECTIVE:  No events overnight. Unable to urinate since 1 AM. Feels that she can but nothing comes out.       ROS: positive as above. Otherwise all ROS negative    MEDICATIONS:   epoetin alfa-epbx  4,000 Units Once per day on Fri    furosemide  100 mg 4x Daily    medroxyPROGESTERone  1.25 mg Q24H NR    methylPREDNISolone sodium succinate  30 mg Daily    metolazone  5 mg Q24H    Prostacyclin Cassette Change   Daily    Prostacyclin Tubing   Once per day on Mon Wed Fri    Prostacylin Batteries   Once per day on Mon    sevelamer carbonate  800 mg TID WC    sildenafil  80 mg TID    sodium chloride (PF)  3 mL Q8H    spironolactone  75 mg Daily        ALLERGY:  Allergies   Allergen Reactions    Allopurinol Hives    Levaquin Swelling and Other     Severe joint pain      Ofloxacin Nausea and Vomiting    Chlorhexidine Itching    Sulfa Drugs Unspecified         OBJECTIVES:  VITAL SIGNS:  Temperature:  [98 F (36.7 C)-98.6 F (37 C)] 98 F (36.7 C) (06/08 1130)  Blood pressure (BP): (103-125)/(55-68) 117/68 (06/08 1130)  Heart Rate:  [88-108] 92 (06/08 1130)  Respirations:  [18-22] 18 (06/08 1130)  Pain Score: 0 (06/08 1130)  O2 Device: Nasal cannula (06/08 1130)  O2 Flow Rate (L/min):  [3 l/min] 3 l/min (06/08 1130)  SpO2:  [98 %-99 %] 99 % (06/08 1130)      06/07 0600 - 06/08 0559  In: 600.6 [P.O.:418; I.V.:182.6]  Out: 2075 [Urine:2075]       Weights (last 3 days)     Date/Time Weight Who    06/20/17 0358  64 kg (141 lb 1.5 oz) AE    06/19/17 0533  62.9 kg (138 lb 10.7 oz) EV    06/18/17 0400  62.3 kg (137 lb 6.4 oz) JS    06/18/17 0200  62.5 kg (137 lb 11.2 oz) JS    06/17/17 0413  61.5 kg (135 lb 9.6 oz) AS              PHYSICAL EXAM:  GEN: NAD  NECK: Supple  CARDIOVASCULAR: RRR   PULMONARY: CTAB/L  ABDOMEN: Soft, NT, distended  LE: 5 mm edema b/l  SKIN: No  rashes      LAB STUDIES:  Recent Labs     06/18/17  0606 06/19/17  0608 06/19/17  1945 06/20/17  0629   NA 133* 135* 133* 133*   K 3.6 3.3* 3.8 3.9   CL 91* 90* 88* 88*   BICARB 24 28 25 27    BUN 115* 117* 119* 121*   CREAT 2.51* 2.75* 2.56* 2.66*   [place] 9.0 9.0 8.8 9.1   PHOS 5.7* 6.4* 6.5* 7.0*   MG 2.1 2.2 2.2 2.3     Recent Labs     06/19/17  0608   WBC 3.9*   HCT 26.7*   HGB 8.1*   PLT 73*   SEG 65   EOS 1   MCV 82.7  IMPRESSION/PLANS:     67 y/o/f with pmh PAH here with AKI on CKD and volume overload    #Nonoliguric AKI on CKD: Would straight cath patient to see if there is an element of urinary retention due to significant abdominal distension/ascites. Continue diuresis per primary team. Renal function stabilizing but has significant CKD and needs to be monitored closely once discharged.     #Anemia: Continue weekly EPO    #Hyperphos: Worsening. Change diet to low Phos.       Discussed with attending Dr. Rush Farmer who agreed with the assessment and plan.    Barnetta Hammersmith, MD  Nephrology Fellow

## 2017-06-21 DIAGNOSIS — N184 Chronic kidney disease, stage 4 (severe): Secondary | ICD-10-CM

## 2017-06-21 DIAGNOSIS — D631 Anemia in chronic kidney disease: Secondary | ICD-10-CM

## 2017-06-21 LAB — BASIC METABOLIC PANEL, BLOOD
Anion Gap: 19 mmol/L — ABNORMAL HIGH (ref 7–15)
BUN: 122 mg/dL — ABNORMAL HIGH (ref 8–23)
Bicarbonate: 25 mmol/L (ref 22–29)
Calcium: 9 mg/dL (ref 8.5–10.6)
Chloride: 87 mmol/L — ABNORMAL LOW (ref 98–107)
Creatinine: 2.71 mg/dL — ABNORMAL HIGH (ref 0.51–0.95)
GFR: 17 mL/min
Glucose: 105 mg/dL — ABNORMAL HIGH (ref 70–99)
Potassium: 3 mmol/L — ABNORMAL LOW (ref 3.5–5.1)
Sodium: 131 mmol/L — ABNORMAL LOW (ref 136–145)

## 2017-06-21 LAB — PHOSPHORUS, BLOOD: Phosphorous: 7 mg/dL — ABNORMAL HIGH (ref 2.7–4.5)

## 2017-06-21 LAB — MAGNESIUM, BLOOD: Magnesium: 2.4 mg/dL (ref 1.6–2.4)

## 2017-06-21 MED ORDER — BUMETANIDE 0.25 MG/ML IJ SOLN
2.00 mg | Freq: Once | INTRAMUSCULAR | Status: AC
Start: 2017-06-21 — End: 2017-06-21
  Administered 2017-06-21: 2 mg via INTRAVENOUS
  Filled 2017-06-21: qty 8

## 2017-06-21 MED ORDER — POTASSIUM CHLORIDE CRYS CR 20 MEQ OR TBCR
20.00 meq | EXTENDED_RELEASE_TABLET | Freq: Once | ORAL | Status: AC
Start: 2017-06-21 — End: 2017-06-21
  Administered 2017-06-21: 20 meq via ORAL
  Filled 2017-06-21: qty 1

## 2017-06-21 MED ORDER — POTASSIUM CHLORIDE 20 MEQ/50ML IV SOLN
20.00 meq | Freq: Once | INTRAVENOUS | Status: DC
Start: 2017-06-21 — End: 2017-06-21
  Administered 2017-06-21 (×2): 20 meq via INTRAVENOUS
  Filled 2017-06-21: qty 50

## 2017-06-21 MED ORDER — SEVELAMER CARBONATE 800 MG OR TABS
1600.00 mg | ORAL_TABLET | Freq: Three times a day (TID) | ORAL | Status: DC
Start: 2017-06-21 — End: 2017-06-24
  Administered 2017-06-21 – 2017-06-24 (×9): 1600 mg via ORAL
  Filled 2017-06-21 (×9): qty 2

## 2017-06-21 NOTE — Plan of Care (Signed)
Problem: Promotion of Health and Safety  Goal: Promotion of Health and Safety  Description  The patient remains safe, receives appropriate treatment and achieves optimal outcomes (physically, psychosocially, and spiritually) within the limitations of the disease process by discharge.    Information below is the current care plan.  Outcome: Progressing  Flowsheets  Taken 06/21/2017 6979 by Emelia Salisbury, RN  Patient /Family stated Goal: to have more urine out  Taken 06/21/2017 1855 by Emelia Salisbury, RN  Guidelines: Inpatient Nursing Guidelines  Outcome Evaluation (rationale for progressing/not progressing) every shift: Strict I&O done. 450 ml output. Pt ambulated around the hallway twice. Pt is very emotional. Pt's son bought pt a prepaid phone so she can call him when she needs to. Pt is compliant with Tx Plan and understands her medications. Pt calls for help.  Taken 06/21/2017 0350 by Berniece Salines, RN  Individualized Interventions/Recommendations #1: Monitor I&O's.  Individualized Interventions/Recommendations #2 (if applicable): Provide emotional support to pt regarding urine output/wt.  Individualized Interventions/Recommendations #3 (if applicable): Cluster care to provide rest.  Taken 06/20/2017 0128 by Dorothyann Gibbs, RN  Individualized Interventions/Recommendations #4 (if applicable): Encourage pt to verbalize feelings about hospital stay to aid with coping  Taken 06/11/2017 0542 by Scheryl Darter, RN  Individualized Interventions/Recommendations #5 (if applicable): electroylte replacement therapy  Note:   Needs addressed in timely manner, pt is updated on POC regularly. Will continue monitoring pt.

## 2017-06-21 NOTE — Progress Notes (Signed)
NEPHROLOGY FELLOW FOLLOW UP NOTE    ID: 67 y/o/f pmh PAH, Sjogren's disease and CKD here with AKI and worsening volume overload    EVENTS/SUBJECTIVE:  No events overnight. Unable to urinate since 1 AM. Feels that she can but nothing comes out.       ROS: positive as above. Otherwise all ROS negative    MEDICATIONS:   epoetin alfa-epbx  4,000 Units Once per day on Fri    furosemide  100 mg 4x Daily    medroxyPROGESTERone  1.25 mg Q24H NR    methylPREDNISolone sodium succinate  30 mg Daily    metolazone  5 mg Q24H    Prostacyclin Cassette Change   Daily    Prostacyclin Tubing   Once per day on Mon Wed Fri    Prostacylin Batteries   Once per day on Mon    sevelamer carbonate  800 mg TID WC    sildenafil  80 mg TID    sodium chloride (PF)  3 mL Q8H    spironolactone  75 mg Daily        ALLERGY:  Allergies   Allergen Reactions    Allopurinol Hives    Levaquin Swelling and Other     Severe joint pain      Ofloxacin Nausea and Vomiting    Chlorhexidine Itching    Sulfa Drugs Unspecified         OBJECTIVES:  VITAL SIGNS:  Temperature:  [98 F (36.7 C)-98.4 F (36.9 C)] 98.2 F (36.8 C) (06/09 0748)  Blood pressure (BP): (117-134)/(67-74) 121/67 (06/09 0748)  Heart Rate:  [80-99] 96 (06/09 0748)  Respirations:  [16-18] 18 (06/09 0748)  Pain Score: 0 (06/09 0748)  O2 Device: Nasal cannula (06/09 0323)  O2 Flow Rate (L/min):  [3 l/min] 3 l/min (06/09 0323)  SpO2:  [96 %-98 %] 96 % (06/09 0748)      06/08 0600 - 06/09 0559  In: 1050.1 [P.O.:860; I.V.:190.1]  Out: 1250 [Urine:1250]       Weights (last 3 days)     Date/Time Weight Who    06/21/17 0323  64.7 kg (142 lb 11.2 oz) DC    06/20/17 0358  64 kg (141 lb 1.5 oz) AE    06/19/17 0533  62.9 kg (138 lb 10.7 oz) EV    06/18/17 0400  62.3 kg (137 lb 6.4 oz) JS    06/18/17 0200  62.5 kg (137 lb 11.2 oz) JS              PHYSICAL EXAM:  GEN: NAD  NECK: Supple  CARDIOVASCULAR: RRR   PULMONARY: CTAB/L except for crackles in bases  ABDOMEN: Soft, NT, distended  LE:  5 mm edema b/l  SKIN: No rashes      LAB STUDIES:  Recent Labs     06/19/17  0608 06/19/17  1945 06/20/17  0629 06/21/17  0644   NA 135* 133* 133* 131*   K 3.3* 3.8 3.9 3.0*   CL 90* 88* 88* 87*   BICARB 28 25 27 25    BUN 117* 119* 121* 122*   CREAT 2.75* 2.56* 2.66* 2.71*   [place] 9.0 8.8 9.1 9.0   PHOS 6.4* 6.5* 7.0* 7.0*   MG 2.2 2.2 2.3 2.4     Recent Labs     06/19/17  0608   WBC 3.9*   HCT 26.7*   HGB 8.1*   PLT 73*   SEG 65   EOS 1  MCV 82.7       IMPRESSION/PLANS:     67 y/o/f with pmh PAH here with AKI on CKD and volume overload    #Nonoliguric AKI on CKD: Likely due to severe RHF/Pulm HTN. Continue diuresis per primary team. Urine output decreasing a bit with stable but not improving creatinine. Diuresis per primary team. Would consider paracentesis as well. Might off load pressure on the kidneys.     #Anemia: Continue weekly EPO    #Hyperphos: Worsening. Will increase sevalemer to 1600 mg tid.       Discussed with attending Dr. Rush Farmer who agreed with the assessment and plan.    Barnetta Hammersmith, MD  Nephrology Fellow

## 2017-06-21 NOTE — Progress Notes (Signed)
NEPHROLOGY PROGRESS NOTE    Chief Complaint: Edema (C/O SWELLING TO BOTH FEET FOR THE LAST 5 DAYS.  C/O ABD BLOATING.  DC'D FROM A HOSP IN [place] FOR DIARRHEA.  TAKEN OFF LASIX FOR THE LAST 5 DAYS.  DROVE [place].  PT IS A FLOLAN PT.)  Reason for Consultation: AKI evaluation  Hospital Day #37 Days 8 Hours  PrimaryTeam/ Attending Provider: Larna Daughters., MD    ID/ Assessment:   Diana Chambers is a 67 year old female w/ PMH of hx of Group 1 PAH associated with SLE and /Sjogren's disease, presented to the hospital with worsening legs swelling. 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   _____________________________________________________________________________    Plan (Today's recommendations in bold):  # AKI on CKD in the setting of PAH/Rt sided HF, discordant I/Os and weight.  -sCr uptrending.  -Solumedrol daily.  -Large ascites; query compressive ascites as current etiology for AKI.  -Estimated dry weight ~129 lb, possibly lower d/t prolonged hospitalization and loss of muscle weight.  -Defer to pulmonary team regarding transitioning lasix from IV to PO.  -06/02 explained in detail the CKD stage and her risk of progression to ESRD- pt mentioned she wouldn't want dialysis if it got to that.    # Lytes:Suspect hypervolemic hyponatremia- downtrending, continue diuresis. Hypokalemia- replete per primary team.  # Volume: Hypervolemic. Recommend fluid restriction 1054ml/day. Diuresis regimen: lasix + spot bumex, spironolactone, metolazone- consider switching to diuril given c/f gut edema. F/u IR paracentesis results.  # Acid-Base: bicarb trended up while diuresing but stable, CTM  # MBD: phos elevated- Renvela + recommend renal diet or at least low phosphorus diet.  # Anemia: Iron studies with low iron sat ~ 16, Iron sucrose 200 mg IV qday x5 doses (end 06/09), Epogen 4000U SQ qweekly.    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    # Dispo: f/u on discharge w/ Dr. Donnelly Stager in renal clinic.      Case seen and discussed with Dr. Adine Madura.  See attending addendum for changes in above plan.    Bonnita Levan, MD  Nephrology Fellow, PGY 4  Division of Nephrology-Hypertension   [facility] [place] [facility]  [address] [place], [place] [id number]  Pager: [phone]  _____________________________________________________________________________    Interval Events: [place]- IR consult for therapeutic paracentesis.  Subjective: Denies CP. Reports weekend didn't go great since she feels her belly is bigger and she is gaining weight. Felt steroids helped her urinate a bit more but not so much overall. Not sure about plans for paracentesis. Reports more SOB laying flat and swelling is stable.  Review of Systems: A full ROS performed and negative outside above subjective history.  _____________________________________________________________________________    Physical Exam:  General: NAD, stting in chair,  HEENT: NC/AT  Eyes: Anicetric sclera  Mouth: MMM  CV: RRR  Pulm: Good air entry, CTAB, mild bibasilar crackles   Abd:  Soft, non tender,  distended  GU: No foley  Ext: Stable 2-3 mm edema on both LE.    24hour Vital Signs:  Temperature:  [97.9 F (36.6 C)-98.3 F (36.8 C)] 98.3 F (36.8 C) (06/10 0352)  Blood pressure (BP): (115-126)/(53-67) 115/53 (06/10 0352)  Heart Rate:  [92-96] 94 (06/10 0352)  Respirations:  [18-20] 20 (06/09 2333)  Pain Score: Patient Sleeping, Respiratory Assessment Done (06/10 0439)  O2 Device: Nasal cannula (06/10 0352)  O2 Flow Rate (L/min):  [3 l/min] 3 l/min (06/10 0352)  SpO2:  [94 %-100 %] 94 % (06/10 0352)    Most Recent Vital Signs:   06/21/17  1946 06/21/17  2333 06/22/17  0200 06/22/17  0352   BP: 118/54 120/64 115/53 115/53      Pulse: 96 96  94   Resp: 20 20     Temp: 98 F (36.7 C) 98.2 F (36.8 C) 98.3 F (36.8 C) 98.3 F (36.8 C)   SpO2: 96% 96% 94% 94%     Intake/Output:  06/09 0600 - 06/10 0559  In: 1342.6 [P.O.:1110; I.V.:232.6]  Out: 1050 [Urine:1050]  Net: 292.6  06/21/2017 0600 - 06/22/2017 0600  Urine x0  Stool x0  Emesis x0    Wt Readings from Last 3 Encounters:   06/22/17 65.8 kg (145 lb 1.6 oz)   01/21/17 58.9 kg (129 lb 14.4 oz)   10/15/16 67.1 kg (148 lb)     Diet Therapeutic; Renal Restricted; Renal 60g Protein/ 2g Na/ 2g K+/ 1g Phos  _____________________________________________________________________________    Labs reviewed; significant for:  Recent Labs     06/19/17  1945 06/20/17  0629 06/21/17  0644   NA 133* 133* 131*   K 3.8 3.9 3.0*   CL 88* 88* 87*   BICARB 25 27 25    BUN 119* 121* 122*   CREAT 2.56* 2.66* 2.71*   GFRNON 19 18 17    GLU 192* 115* 105*   ANION 20* 18* 19*   [place] 8.8 9.1 9.0   MG 2.2 2.3 2.4   PHOS 6.5* 7.0* 7.0*     No results for input(s): WBC, HGB, HCT, PLT in the last 72 hours._____________________________________________________________________________  Microbiology  Lab Results   Component Value Date    BCRES No Growth 05/16/2017     Lab Results   Component Value Date    URINECULTURE (A) 06/11/2016     Staphylococcus aureus  >100,000 colonies/mL  Two colony types; same susceptibility pattern  Identification performed by [employer] [facility]( [facility]di-ToF). This test  was developed and its performance characteristics determined by [facility]. It has not been cleared or  approved by the U.S. Food and Drug Administration.  The FDA has  determined that such clearance or approval is not necessary.       Lab Results   Component Value Date    GSRES  02/22/2013     Rare Gram positive cocci  Rare yeast  (Performed at [facility]       Lab Results   Component Value Date    CXRES Moderate Normal Respiratory Flora (A) 06/15/2011    CXRES Moraxella (Branhamella) catarrhalis  Heavy  06/15/2011    CXRES Streptococcus group C  Rare 06/15/2011     _____________________________________________________________________________  Medications reviewed   epoetin alfa-epbx  4,000 Units Once per day on Fri    furosemide  100 mg 4x Daily    medroxyPROGESTERone  1.25 mg Q24H NR    methylPREDNISolone sodium succinate  30 mg Daily    metolazone  5 mg Q24H    Prostacyclin Cassette Change   Daily    Prostacyclin Tubing   Once per day on Mon Wed Fri    Prostacylin Batteries   Once per day on Mon    sevelamer carbonate  1,600 mg TID  WC    sildenafil  80 mg TID    sodium chloride (PF)  3 mL Q8H    spironolactone  75 mg Daily      DOPamine 2 mcg/kg/min (06/21/17 2004)    epoprostenol (FLOLAN) infusion 35 ng/kg/min (06/21/17 2004)    sodium chloride        acetaminophen  650 mg Q6H PRN    docusate sodium  250 mg Daily PRN    loperamide  2 mg TID PRN    nalOXone  0.1 mg Q2 Min PRN    polyethylene glycol  17 g Daily PRN    senna  2 tablet Daily PRN    simethicone  80 mg Q6H PRN    sodium chloride (PF)  3 mL PRN    sodium chloride   Continuous PRN     _____________________________________________________________________________  Imaging reviewed  US Abdomen Limited  Narrative: EXAM DESCRIPTION:  US ABDOMEN LIMITED    CLINICAL HISTORY:  Evaluate for ascites    TECHNIQUE:  Targeted grayscale ultrasound of the 4 abdominal quadrants and pelvis for evaluation of ascites.    COMPARISON:  Abdominal ultrasound 05/29/2016    FINDINGS:  Targeted ultrasound of the 4 abdominal quadrants and pelvis was performed, which demonstrates large volume ascites, with the largest vertical pocket in the left lower quadrant measuring up to 12.5 cm.    CONCURRENT SUPERVISION:  I have reviewed the images and agree with the resident's interpretation.           Preliminary created by: Warnell Bureau   Signed byFredric Dine 06/17/2017 20:12:11  Impression: IMPRESSION:  Large volume  ascites.    _____________________________________________________________________________  Allergies  Allergies   Allergen Reactions    Allopurinol Hives    Levaquin Swelling and Other     Severe joint pain      Ofloxacin Nausea and Vomiting    Chlorhexidine Itching    Sulfa Drugs Unspecified     _____________________________________________________________________________    NEPHROLOGY ATTENDING PROGRESS NOTE    Subjective  Fellow/resident's interval history reviewed, patient interviewed and examined.  Interval history as noted in fellow/resident 's note.     Objective  I have reviewed the vitals and  examined the patient and agree with the fellow/resident's findings.     Labs and radiology results were reviewed     Assessment/Plan    The fellow/resident's note reflects our discussion of all pertinent patient data and the treatment plan. Please see fellow/resident's note for further details.    Oda Cogan, MD  Nephrology Attending  PID [id number]  Pager [phone]

## 2017-06-21 NOTE — Interdisciplinary (Signed)
Clinical Nutrition Follow Up:  Assessment: Per MD: 67 y/o/f pmh PAH, Sjogren's disease and CKD here with AKI and worsening volume overload  Anthropometrics   Height: 5\' 2"  (157.5 cm)   Weight: 64.7 kg (142 lb 11.2 oz)      IBW= 110lb  %IBW= 129%  Body mass index is 26.1 kg/m.   UBW: 135-139lb per DTR 05/18/17   WT Change:  +4# since last wt reviewed at 138# on 5/30. Pt reports wt gain is d/t fluid retention   Date Weight Recorded 06/21/2017 06/20/2017 06/19/2017 06/18/2017 06/18/2017 06/17/2017   Metric 64.728 kg 64 kg 62.9 kg 62.324 kg 62.46 kg 61.508 kg   Pounds/Ounces 142 lb 11.2 oz 141 lb 1.5 oz 138 lb 10.7 oz 137 lb 6.4 oz 137 lb 11.2 oz 135 lb 9.6 oz      Diet:Diet Therapeutic; Renal Restricted; Renal 60g Protein/ 2g Na/ 2g K+/ 1g Phos  PO Intake: Average intake ~ 91% x 18 meals, ~ 3.3 items consumed per meal tray.  PO declined since last reviewed at ~ 97% x 21 meals, ~ 3.7 items, however remains excellent..  Appt: "so so"  Tray Items Taken for the past 168 hrs:   Number of Items Taken Number of Items on Tray Diet Tolerance   06/14/17 1300 4 4 --   06/15/17 0850 4 4 Tolerates   06/15/17 1300 4 4 Tolerates   06/15/17 1837 3 3 Tolerates   06/16/17 0821 3 4 Tolerates   06/16/17 1331 4 4 Tolerates   06/17/17 0753 4 4 Tolerates   06/17/17 1200 4 4 Tolerates   06/17/17 1730 3 3 Tolerates   06/17/17 2000 -- -- Tolerates   06/18/17 0808 4 4 Tolerates   06/18/17 1325 2.5 4 Tolerates   06/19/17 0827 3 4 Tolerates   06/19/17 1200 1 2 Tolerates   06/19/17 1727 2 3 Tolerates   06/20/17 0900 4 4 Tolerates   06/20/17 1300 4 4 Tolerates   06/20/17 1746 3 4 Tolerates   06/21/17 0900 3 3 Tolerates     No data found.  Allergies/Intolerances: NKFA  Allergies   Allergen Reactions    Allopurinol Hives    Levaquin Swelling and Other     Severe joint pain      Ofloxacin Nausea and Vomiting    Chlorhexidine Itching    Sulfa Drugs Unspecified     Chewing difficulty: None per pt  GI: per pt denied nausea/vomiting/diarrhea   Stool Assessment  for the past 168 hrs:   Stool Appearance Stool Color Stool Amount Stool Source   06/14/17 1800 Soft formed Brown Medium Rectum   06/15/17 0850 -- -- -- Rectum   06/15/17 1122 -- -- -- Rectum   06/15/17 1744 -- -- -- Rectum   06/16/17 0121 Soft formed [place] Small Rectum   06/17/17 0559 Soft formed Brown Small Rectum   06/17/17 1100 -- -- -- Rectum   06/18/17 1124 Partially liquid Brown Medium Rectum   06/20/17 1510 -- -- -- Rectum     Skin: Per Head to Toe Assessment (6/9): WDL   Patient Lines/Drains/Airways Status    Active PICC Line / CVC Line / PIV Line / Drain / Airway / Intraosseous Line / Epidural Line / ART Line / Line Type / Wound     Name: Placement date: Placement time: Site: Days:    CVC Single Lumen - Present on admission Left Chest  --   --   Chest  Peripheral IV - 22 G Left Forearm  06/15/17   0840   Forearm  6    Peripheral IV -  06/15/17 1440 Left Forearm  06/15/17   1440   Forearm  5    Indwelling Urinary Catheter -  06/20/17 RN Standard (Latex) 10 ml  06/20/17   1000   Standard (Latex)  1    Impaired Skin Integrity -  Laceration Toe (Comment  which one) Left  06/07/17   2200   13    Impaired Skin Integrity -  Blister;Hematoma Chest Left;Upper  06/14/17   2000   6              Edema:   RL Extremity  +2;Ankle;Foot;  Mid Calf;Pitti-  ng  --  --  +2;Ankle;Foot;  Mid Calf;Pitti-  ng  --     OJ      DC     LL Extremity  +2;Ankle;Foot;  Mid Calf;Pitti-  ng  --  --  +2;Ankle;Foot;  Mid Calf;Pitti-  ng  --     OJ      DC       Labs:    Lab Results   Component Value Date    PREALB 26 06/06/2017    A1C 5.5 07/20/2016      Recent Labs     06/19/17  1945 06/20/17  0629 06/21/17  0644   NA 133* 133* 131*   K 3.8 3.9 3.0*   CL 88* 88* 87*   BICARB 25 27 25    BUN 119* 121* 122*   CREAT 2.56* 2.66* 2.71*   GLU 192* 115* 105*    8.8 9.1 9.0   MG 2.2 2.3 2.4   PHOS 6.5* 7.0* 7.0*     BS POCT: No results for input(s): GLUCPOCT in the last 72 hours.  Nutr Related MEDS:   Current Facility-Administered Medications    Medication    acetaminophen (TYLENOL) tablet 650 mg    DOPamine (INTROPIN) 400 mg/250 mL infusion (1600 mcg/mL)    epoetin alfa-epbx (RETACRIT) injection 4,000 Units    epoprostenol (FLOLAN) 60,000 ng/mL in glycine diluent 100 mL infusion    furosemide (LASIX) injection 100 mg    loperamide (IMODIUM) capsule 2 mg    medroxyPROGESTERone (PROVERA) tablet 1.25 mg    methylPREDNISolone sodium succinate (SOLU-MEDROL) injection 30 mg    metolazone (ZAROXOLYN) tablet 5 mg    nalOXone (NARCAN) injection 0.1 mg    Prostacyclin Cassette Change    Prostacyclin Tubing    Prostacylin Batteries    sevelamer carbonate (RENVELA) tablet 800 mg    sildenafil (REVATIO, VIAGRA) tablet 80 mg    simethicone (MYLICON) chewable tablet 80 mg    sodium chloride (PF) 0.9 % flush 3 mL    sodium chloride (PF) 0.9 % flush 3 mL    sodium chloride 0.9 % TKO infusion    spironolactone (ALDACTONE) tablet 75 mg     Education: if/when appropriate   PLAN: Continue to trend weight/labs/skin.  Will continue to honor food preferences with in dietary guidelines, monitor PO intake and diet tolerance. Will relay relevant, significant pt info and status change to Registered Dietitian.    Brookel White, DTR

## 2017-06-21 NOTE — Plan of Care (Signed)
Problem: Promotion of Health and Safety  Goal: Promotion of Health and Safety  Description  The patient remains safe, receives appropriate treatment and achieves optimal outcomes (physically, psychosocially, and spiritually) within the limitations of the disease process by discharge.    Information below is the current care plan.  Outcome: Progressing  Flowsheets  Taken 06/20/2017 1911  Patient /Family stated Goal: sleep  Taken 06/21/2017 0350  Guidelines: Inpatient Nursing Guidelines  Individualized Interventions/Recommendations #1: Monitor I&O's.  Individualized Interventions/Recommendations #2 (if applicable): Provide emotional support to pt regarding urine output/wt.  Individualized Interventions/Recommendations #3 (if applicable): Cluster care to provide rest.  Outcome Evaluation (rationale for progressing/not progressing) every shift: I&O's strictly monitored; so far pt has had 333mL out this shift. Pt became extremely emotional regarding output and weight, feels defeated. Provided emotional support which helped pt to calm down and return to sleep. Care clustered to provide restful sleep.

## 2017-06-21 NOTE — Progress Notes (Signed)
PULMONARY/CRITICAL CARE ATTENDING NOTE  Current Hospital 36 Days 10 Hours     Subjective:   No acute issues overnight. In good spirits with son at bedside.    Medications:  Scheduled Meds   epoetin alfa-epbx  4,000 Units Once per day on Fri    furosemide  100 mg 4x Daily    medroxyPROGESTERone  1.25 mg Q24H NR    methylPREDNISolone sodium succinate  30 mg Daily    metolazone  5 mg Q24H    Prostacyclin Cassette Change   Daily    Prostacyclin Tubing   Once per day on Mon Wed Fri    Prostacylin Batteries   Once per day on Mon    sevelamer carbonate  800 mg TID WC    sildenafil  80 mg TID    sodium chloride (PF)  3 mL Q8H    spironolactone  75 mg Daily     PRN Meds   acetaminophen  650 mg Q6H PRN    loperamide  2 mg TID PRN    nalOXone  0.1 mg Q2 Min PRN    simethicone  80 mg Q6H PRN    sodium chloride (PF)  3 mL PRN    sodium chloride   Continuous PRN     IV Meds   DOPamine 2 mcg/kg/min (06/20/17 1900)    epoprostenol (FLOLAN) infusion 35 ng/kg/min (06/20/17 1900)    sodium chloride         Vitals:    Latest Entry  Range (last 24 hours)    Temperature: 98.2 F (36.8 C)  Temp  Avg: 98.6 F (37 C)  Min: 98.1 F (36.7 C)  Max: 98.8 F (37.1 C)    Blood pressure (BP): 121/67  BP  Min: 109/61  Max: 133/70    Heart Rate: 96  Pulse  Avg: 103.1  Min: 96  Max: 114    Respirations: 18  Resp  Avg: 18.7  Min: 18  Max: 20    SpO2: 96 %  SpO2  Avg: 97.8 %  Min: 95 %  Max: 99 %       No data recorded     Weight: 64.7 kg (142 lb 11.2 oz)  Percentage Weight Change (%): 1.14 %    V       Intake/Output Summary (Last 24 hours) at 06/19/2017 1113  Last data filed at 06/19/2017 0827  Gross per 24 hour   Intake 968.64 ml   Output 1350 ml   Net -381.36 ml       Exam:   Gen: NAD, comfortable in chair  NECK: + JVD  CV: RRR, + TR no S3  RESP: Rales at bases bilat  ABD: distended + ascites  EXT:2+ LE edema  NEURO: non-focal, grossly intact    Labs:   CBC  Recent Labs     06/17/17  0611 06/19/17  0608   WBC 3.8* 3.9*   HGB  8.4* 8.1*   HCT 27.5* 26.7*   PLT 59* 73*   SEG 68 65   LYMPHS 23 23   MONOS 3 8      Chemistry  Recent Labs     06/18/17  0606 06/19/17  0608   NA 133* 135*   K 3.6 3.3*   CL 91* 90*   BICARB 24 28   BUN 115* 117*   CREAT 2.51* 2.75*   GLU 87 91   [place] 9.0 9.0   MG 2.1 2.2   PHOS 5.7*  6.4*       ASSESSMENT/PLAN:  67 yo F with WHO I PAH associated with SLE/sjogrens, LIP and prior anorexogen use admitted with acute on chronic right heart failure and volume overload as well as AKI and thrombocytopenia.    Wt worse. Exam worse despite restarting dopamine and ongoing rx with lasix IV, metolazone.  Cr worse.    During prior hospitalization in 2018 had dramatic improvement in edema and efficacy of diuretics with administration of steroids.  Etiology unclear.    Again earlier in this hospitalization had sudden improvement in weight and efficacy of diuretics after 3 days of solumedrol 30 mg.    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  # AKI on CKD, baseline around 1.3  # Thrombocytopenia: stable  # Chronic Hypoxia on home oxygen    - continue solumedrol 30 mg QDAY (day 4 of 4) and monitor for effect on diuretic efficacy/weight loss after 3-4 days of steroids will d/c  - If no change in clinical status with steroids will have IR perform paracentesis  - Lasix 100 mg QID  - Cont dopamine 2 mcg/kg/min  - Metolazone 5mg  qday  - Continue Spironolactone  - Continue home sildenafil and flolan at 35 ng/kg/min  - Continue oxygen suppl  - Fluid restrict to 1.5 L/day  - Iron Sucrose for iron def anemia    -Extra dose of Bumex 2mg  this AM.  -foley in place for difficulty voiding. Currently with with decreasing output and rising Cr, suspect this will improve with continued diuresis.    This patient was seen and discussed with my attending Dr. Maudie Mercury.    Brynda Rim, MD  Fellow, Pulmonary and [facility]    Attending Addendum:  I examined the patient and reviewed the interval  labs/results.  I discussed with Dr. Jacky Kindle and agree with above.  No sign events overnight.  Severe end-stage PAH, RV failure complicated by CKD and anemia.  CPM.    Aretta Nip, M.D.  Pulmonary Hypertension/PTE Attending  PID# [id number], Pager [phone]

## 2017-06-22 ENCOUNTER — Encounter (HOSPITAL_COMMUNITY)
Admission: EM | Disposition: A | Discharge status: Home Health | Payer: Self-pay | Source: Emergency Department | Attending: Pulmonary Medicine

## 2017-06-22 ENCOUNTER — Inpatient Hospital Stay (HOSPITAL_COMMUNITY): Payer: PPO

## 2017-06-22 DIAGNOSIS — I272 Pulmonary hypertension, unspecified: Secondary | ICD-10-CM

## 2017-06-22 DIAGNOSIS — J986 Disorders of diaphragm: Secondary | ICD-10-CM

## 2017-06-22 DIAGNOSIS — I288 Other diseases of pulmonary vessels: Secondary | ICD-10-CM

## 2017-06-22 DIAGNOSIS — R188 Other ascites: Secondary | ICD-10-CM

## 2017-06-22 DIAGNOSIS — I517 Cardiomegaly: Secondary | ICD-10-CM

## 2017-06-22 DIAGNOSIS — I700 Atherosclerosis of aorta: Secondary | ICD-10-CM

## 2017-06-22 DIAGNOSIS — I50813 Acute on chronic right heart failure: Secondary | ICD-10-CM

## 2017-06-22 DIAGNOSIS — N19 Unspecified kidney failure: Secondary | ICD-10-CM

## 2017-06-22 DIAGNOSIS — Z452 Encounter for adjustment and management of vascular access device: Secondary | ICD-10-CM

## 2017-06-22 LAB — CBC WITH DIFF, BLOOD
ANC-Automated: 3.7 10*3/uL (ref 1.6–7.0)
Abs Basophils: 0 10*3/uL (ref <0.1)
Abs Eosinophils: 0 10*3/uL (ref 0.1–0.5)
Abs Lymphs: 1 10*3/uL (ref 0.8–3.1)
Abs Monos: 0.3 10*3/uL (ref 0.2–0.8)
Basophils: 0 %
Eosinophils: 1 %
Hct: 27.8 % — ABNORMAL LOW (ref 34.0–45.0)
Hgb: 8.6 gm/dL — ABNORMAL LOW (ref 11.2–15.7)
Imm Gran %: 4 % — ABNORMAL HIGH (ref <1)
Imm Gran Abs: 0.2 10*3/uL — ABNORMAL HIGH (ref <0.1)
Lymphocytes: 19 %
MCH: 25.1 pg — ABNORMAL LOW (ref 26.0–32.0)
MCHC: 30.9 g/dL — ABNORMAL LOW (ref 32.0–36.0)
MCV: 81.3 um3 (ref 79.0–95.0)
MPV: 11.1 fL (ref 9.4–12.4)
Monocytes: 5 %
Plt Count: 86 10*3/uL — ABNORMAL LOW (ref 140–370)
RBC: 3.42 10*6/uL — ABNORMAL LOW (ref 3.90–5.20)
RDW: 18.7 % — ABNORMAL HIGH (ref 12.0–14.0)
Segs: 71 %
WBC: 5.3 10*3/uL (ref 4.0–10.0)

## 2017-06-22 LAB — PROTHROMBIN TIME, BLOOD
INR: 1.3
INR: 1.3
PT,Patient: 13.9 s — ABNORMAL HIGH (ref 9.7–12.5)
PT,Patient: 14.4 s — ABNORMAL HIGH (ref 9.7–12.5)

## 2017-06-22 LAB — BASIC METABOLIC PANEL, BLOOD
Anion Gap: 18 mmol/L — ABNORMAL HIGH (ref 7–15)
BUN: 126 mg/dL — ABNORMAL HIGH (ref 8–23)
Bicarbonate: 25 mmol/L (ref 22–29)
Calcium: 8.5 mg/dL (ref 8.5–10.6)
Chloride: 86 mmol/L — ABNORMAL LOW (ref 98–107)
Creatinine: 2.84 mg/dL — ABNORMAL HIGH (ref 0.51–0.95)
GFR: 17 mL/min
Glucose: 85 mg/dL (ref 70–99)
Potassium: 3.6 mmol/L (ref 3.5–5.1)
Sodium: 129 mmol/L — ABNORMAL LOW (ref 136–145)

## 2017-06-22 LAB — MAGNESIUM, BLOOD: Magnesium: 2.3 mg/dL (ref 1.6–2.4)

## 2017-06-22 LAB — PHOSPHORUS, BLOOD: Phosphorous: 6.1 mg/dL — ABNORMAL HIGH (ref 2.7–4.5)

## 2017-06-22 SURGERY — IR PARACENTESIS

## 2017-06-22 MED ORDER — POLYETHYLENE GLYCOL 3350 OR PACK
17.00 g | PACK | Freq: Every day | ORAL | Status: DC | PRN
Start: 2017-06-22 — End: 2017-06-23
  Administered 2017-06-22 (×2): 17 g via ORAL
  Filled 2017-06-22: qty 1

## 2017-06-22 MED ORDER — LIDOCAINE HCL 1 % IJ SOLN
5.0000 mL | Freq: Once | INTRAMUSCULAR | Status: AC | PRN
Start: 2017-06-22 — End: 2017-06-23

## 2017-06-22 MED ORDER — SODIUM CHLORIDE 0.9 % IJ SOLN (CUSTOM)
10.0000 mL | INTRAMUSCULAR | Status: DC | PRN
Start: 2017-06-22 — End: 2017-07-15
  Administered 2017-07-07 – 2017-07-08 (×2): 10 mL via INTRAVENOUS

## 2017-06-22 MED ORDER — DOCUSATE SODIUM 250 MG OR CAPS
250.00 mg | ORAL_CAPSULE | Freq: Every day | ORAL | Status: DC | PRN
Start: 2017-06-22 — End: 2017-06-23
  Administered 2017-06-23: 250 mg via ORAL
  Filled 2017-06-22 (×3): qty 1

## 2017-06-22 MED ORDER — BUMETANIDE 0.25 MG/ML IJ SOLN
4.00 mg | Freq: Once | INTRAMUSCULAR | Status: AC
Start: 2017-06-22 — End: 2017-06-22
  Administered 2017-06-22: 4 mg via INTRAVENOUS
  Filled 2017-06-22: qty 16

## 2017-06-22 MED ORDER — LIDOCAINE HCL 1 % IJ SOLN
INTRAMUSCULAR | Status: DC | PRN
Start: 2017-06-22 — End: 2017-06-22
  Administered 2017-06-22: 10 mL via INTRADERMAL

## 2017-06-22 MED ORDER — SENNA 8.6 MG OR TABS
2.00 | ORAL_TABLET | Freq: Every day | ORAL | Status: DC | PRN
Start: 2017-06-22 — End: 2017-07-15
  Administered 2017-06-23: 17.2 mg via ORAL
  Filled 2017-06-22: qty 2

## 2017-06-22 SURGICAL SUPPLY — 6 items
ADAPTER M/P CATH FEM L/L-UNIV (Misc Surgical Supply) ×2 IMPLANT
APPLICATOR CHLORAPREP 10.5ML (Kits/Sets/Trays) ×4
BOTTLE EVACUATED VENT 1000ML (Tubing/Suction) IMPLANT
CATHETER CENTESIS 1-STEP 4FR 7CM (Lines/Drains) ×2 IMPLANT
TRAY THORACENTESIS (Kits/Sets/Trays) ×2 IMPLANT
TUBING CONN SUCTN 3/16 X 18 (Tubing/suction) ×2

## 2017-06-22 NOTE — Plan of Care (Signed)
Problem: Promotion of Health and Safety  Goal: Promotion of Health and Safety  Description  The patient remains safe, receives appropriate treatment and achieves optimal outcomes (physically, psychosocially, and spiritually) within the limitations of the disease process by discharge.    Information below is the current care plan.  Outcome: Progressing  Flowsheets  Taken 06/21/2017 1946  Patient /Family stated Goal: pee and sleep  Taken 06/22/2017 0204  Guidelines: Inpatient Nursing Guidelines  Individualized Interventions/Recommendations #1: Monitor I/O's.  Individualized Interventions/Recommendations #2 (if applicable): Provide emotional support.  Individualized Interventions/Recommendations #3 (if applicable): Cluster care to provide restful sleep.  Outcome Evaluation (rationale for progressing/not progressing) every shift: Strict I/O's monitored. Pt becomes very emotional and needs continued reinforcement and support; calming techniques implemented. Clustered care to promote rest.

## 2017-06-22 NOTE — Procedures (Signed)
Diana Chambers is a 67 year old female patient.    ICD-10-CM ICD-9-CM   1. Edema, unspecified type R60.9 782.3   2. PAH (pulmonary artery hypertension) (CMS-HCC) I27.21 416.8   3. Hypervolemia, unspecified hypervolemia type E87.70 276.69     Past Medical History:   Diagnosis Date    Pulmonary HTN (CMS-HCC)     Sjoegren syndrome (CMS-HCC) 07/12/2009     Blood pressure 120/66, pulse 101, temperature 97.8 F (36.6 C), resp. rate 16, height 5\' 2"  (1.575 m), weight 65.8 kg (145 lb 1.6 oz), SpO2 95 %.    PICC Placement    Date/Time: 06/22/2017 11:00 AM      Universal Protocol  Consent: Verbal consent obtained. Written consent obtained.  Risks and benefits: risks, benefits and alternatives were discussed  Consent given by: patient  Patient understanding: patient states understanding of the procedure being performed  Patient consent: the patient's understanding of the procedure matches consent given  Procedure consent: procedure consent matches procedure scheduled  Relevant documents: relevant documents present and verified  Test results: test results available and properly labeled  Site marked: the operative site was marked  Imaging studies: imaging studies available  Patient identity confirmed: verbally with patient, arm band, provided demographic data and hospital-assigned identification number  Time out: Immediately prior to procedure a "time out" was called to verify the correct patient, procedure, equipment, support staff and site/side marked as required.      Indications and Staff  Indications: medications/fluids  Insertion location/unit: [place]  [facility] PCU 4B  Reason for insertion: new indication      Performed by: Osborne Casco, RN  Attending present: no  Additional staff members:  Burnis Medin, RN           Procedure Details  Anesthesia: local infiltration  Local Anesthetic: lidocaine 1% without epinephrine  Anesthetic total: 3 mL    Skin prep used?: povidone iodine and alcohol  Allergic to  Chlorhexidine? yes  Contraindication Reason: documented allergy/reaction to chlorhexidine  Skin prep dry before first puncture: yes      Patient position: supine  Insertion side: right   Insertion site: brachial    Catheter type: PICC  Catheter lumen: double lumen    Is this line for Dialysis: no  Brand (adult): Helaine Chess    Lot #: [id number]    Power rated for IV contrast: yes  Antimicrobial catheter used: no  Ultrasound guidance: yes  Sterile ultrasound technique: sterile gel and sterile probe covers were used.  Indications for ultrasound: safety      Ultrasound guided placement steps: vessel located, needle entry and guide wire removed  Sterile precautions used: sterile gown, cap, mask/eye shield, sterile gloves, head to toe drape on patient and hand hygiene  Sterile field maintained during procedure: Yes  Sterile technique maintained during procedure and dressing application: Yes  Catheter securement: non-suture securement device    Post Procedure  Post-procedure: dressing applied  Estimated Blood Loss: 3 ml  Assessment: blood return through all ports and placement verified by x-ray  Complications: none    Follow up: portable chest xray ordered and flush protocol by guideline  Number of puncture attempts: 1  Line placement successful: yes  Catheter tip location: SVC  Final length internal catheter: 34  Final length external catheter (cm): 0  Patient tolerance: Patient tolerated the procedure well with no immediate complications      Comments: 34/0 right brachial vein  Osborne Casco, RN  06/22/2017

## 2017-06-22 NOTE — Progress Notes (Signed)
PULMONARY/CRITICAL CARE ATTENDING NOTE  Current Hospital 37 Days 9 Hours     Subjective:   No acute issues overnight. Son is here. UOP slowing down without any associated sx. Agreeable to try possible para today.    Medications:  Scheduled Meds   epoetin alfa-epbx  4,000 Units Once per day on Fri    furosemide  100 mg 4x Daily    medroxyPROGESTERone  1.25 mg Q24H NR    methylPREDNISolone sodium succinate  30 mg Daily    metolazone  5 mg Q24H    Prostacyclin Cassette Change   Daily    Prostacyclin Tubing   Once per day on Mon Wed Fri    Prostacylin Batteries   Once per day on Mon    sevelamer carbonate  1,600 mg TID WC    sildenafil  80 mg TID    sodium chloride (PF)  3 mL Q8H    spironolactone  75 mg Daily     PRN Meds   acetaminophen  650 mg Q6H PRN    docusate sodium  250 mg Daily PRN    loperamide  2 mg TID PRN    nalOXone  0.1 mg Q2 Min PRN    polyethylene glycol  17 g Daily PRN    senna  2 tablet Daily PRN    simethicone  80 mg Q6H PRN    sodium chloride (PF)  3 mL PRN    sodium chloride   Continuous PRN     IV Meds   DOPamine 2 mcg/kg/min (06/22/17 0724)    epoprostenol (FLOLAN) infusion 35 ng/kg/min (06/21/17 2004)    sodium chloride         Vitals:    Latest Entry  Range (last 24 hours)    Temperature: 97.9 F (36.6 C)  Temp  Avg: 98.6 F (37 C)  Min: 98.1 F (36.7 C)  Max: 98.8 F (37.1 C)    Blood pressure (BP): 110/78  BP  Min: 109/61  Max: 133/70    Heart Rate: 105  Pulse  Avg: 103.1  Min: 96  Max: 114    Respirations: 20  Resp  Avg: 18.7  Min: 18  Max: 20    SpO2: 94 %  SpO2  Avg: 97.8 %  Min: 95 %  Max: 99 %       No data recorded     Weight: 65.8 kg (145 lb 1.6 oz)  Percentage Weight Change (%): 1.68 %    V       Intake/Output Summary (Last 24 hours) at 06/19/2017 1113  Last data filed at 06/19/2017 0827  Gross per 24 hour   Intake 968.64 ml   Output 1350 ml   Net -381.36 ml       Exam:   Gen: NAD, comfortable in chair  NECK: + JVD  CV: RRR, + TR no S3  RESP: CTAB, normal  wob  ABD: distended + ascites  EXT:2+ LE edema  NEURO: non-focal, grossly intact  SKIN: Plethoric with multiple UE ecchymoses    Labs:   CBC  Recent Labs     06/17/17  0611 06/19/17  0608   WBC 3.8* 3.9*   HGB 8.4* 8.1*   HCT 27.5* 26.7*   PLT 59* 73*   SEG 68 65   LYMPHS 23 23   MONOS 3 8      Chemistry  Recent Labs     06/18/17  0606 06/19/17  0608   NA 133* 135*  K 3.6 3.3*   CL 91* 90*   BICARB 24 28   BUN 115* 117*   CREAT 2.51* 2.75*   GLU 87 91   [place] 9.0 9.0   MG 2.1 2.2   PHOS 5.7* 6.4*       ASSESSMENT/PLAN:  67 yo F with WHO I PAH associated with SLE/sjogrens, LIP and prior anorexogen use admitted with acute on chronic right heart failure and volume overload as well as AKI and thrombocytopenia.    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  # AKI on CKD, baseline around 1.3  # Thrombocytopenia: stable  # Chronic Hypoxia on home oxygen    - finish solumedrol 30 mg QDAY (day 4 of 4) today and monitor for effect on diuretic efficacy/weight loss   - IR perform paracentesis today given ongoing weight gain and slowing uop despite foley, dopamine and steroids  - Lasix 100 mg QID  - Cont dopamine 2 mcg/kg/min  - Metolazone 5mg  qday  - Continue Spironolactone  - Continue home sildenafil and flolan at 35 ng/kg/min  - Continue oxygen suppl  - Fluid restrict to 1.5 L/day  - Iron Sucrose for iron def anemia    -Extra dose of Bumex 4mg  this AM.  -foley in place for difficulty voiding. Currently with with decreasing output and rising Cr, suspect this will improve with continued diuresis.    This patient was seen and discussed with my attending Dr. Joyce Gross, MD  Fellow, Pulmonary and [facility]      Pulmonary Vascular Attending Addendum:  Patient was examined with Dr. Artist Beach, imaging and tests reviewed.  Please see above note for full details and plan.    67 yo F with WHO I PAH associated with SLE/sjogrens, LIP and prior anorexogen use admitted with  acute on chronic right heart failure and volume overload as well as AKI and thrombocytopenia.    Wtworse. Exam worse deespite dopamine and ongoing rx with lasix IV, metolazone.  Cr also worse.    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  Epogen Qweek.    [name] M.D.  Pulmonary and Critical Care Attending  Pager [phone]

## 2017-06-22 NOTE — Progress Notes (Signed)
NEPHROLOGY PROGRESS NOTE    Chief Complaint: Edema (C/O SWELLING TO BOTH FEET FOR THE LAST 5 DAYS.  C/O ABD BLOATING.  DC'D FROM A HOSP IN [place] FOR DIARRHEA.  TAKEN OFF LASIX FOR THE LAST 5 DAYS.  DROVE [place].  PT IS A FLOLAN PT.)  Reason for Consultation: AKI evaluation  Hospital Day #38 Days 8 Hours  PrimaryTeam/ Attending Provider: Larna Daughters., MD    ID/ Assessment:   Lurdes Haltiwanger is a 67 year old female w/ PMH of hx of Group 1 PAH associated with SLE and /Sjogren's disease, presented to the hospital with worsening legs swelling. 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   _____________________________________________________________________________    Plan (Today's recommendations in bold):  # AKI on CKD in the setting of PAH/Rt sided HF, discordant I/Os and weight.  -sCr uptrending.  -Solumedrol daily.  -Large ascites; query compressive ascites as current etiology for AKI.  -Estimated dry weight ~129 lb, possibly lower d/t prolonged hospitalization and loss of muscle weight.  -Defer to pulmonary team regarding transitioning lasix from IV to PO.  -06/02 explained in detail the CKD stage and her risk of progression to ESRD- pt mentioned she wouldn't want dialysis if it got to that.    # Lytes:Suspect hypervolemic hyponatremia- downtrending, continue diuresis. Hypokalemia- replete per primary team.  # Volume: Hypervolemic. Recommend fluid restriction 1050ml/day. Diuresis regimen: lasix + spot bumex, spironolactone, metolazone- consider IV diuril given possible gut edema. S/p 5.5L IR paracentesis  # Acid-Base: bicarb acceptable but downtrending.  # MBD: phos elevated- Renvela + recommend renal diet or at least low phosphorus diet.  # Anemia: Iron studies with low iron sat ~ 16, s/p Iron sucrose 200 mg IV qday x5 doses (end 06/09), Epogen 4000U SQ qweekly.    # Large ascites 2/2 likely  congestive hepatopathy from right heart failure; paracentesis studies TP will help differentiate if determined to be transudative.    # Dispo: f/u on discharge w/ Dr. Donnelly Stager in renal clinic.      Case seen and discussed with Dr. Adine Madura.  See attending addendum for changes in above plan.    Bonnita Levan, MD  Nephrology Fellow, PGY 4  Division of Nephrology-Hypertension   [facility] [place] [place] [facility]  [phone]  [place], [place] [address]  Pager: [phone]  _____________________________________________________________________________    Interval Events: [name]- PVM/primary- IR paracentesis 5.5L, dopamine increased 60mcg/kg/min, PICC placed.  Subjective: Denies CP, swelling stable. Abdominal distension and breathing improved after 5.5L paracentesis. Feeling constipated still.  Review of Systems: A full ROS performed and negative outside above subjective history.  _____________________________________________________________________________    Physical Exam:  General: NAD, stting in chair,  HEENT: NC/AT  Eyes: Anicetric sclera  Mouth: MMM  CV: RRR  Pulm: Good air entry, CTAB, mild bibasilar crackles   Abd:  Soft, non tender,  distended  GU: No foley  Ext: Stable 2-3 mm edema on both LE.    24hour Vital Signs:  Temperature:  [97.4 F (36.3 C)-98.4 F (36.9 C)] 98.2 F (36.8 C) (06/11 0708)  Blood pressure (BP): (110-140)/(48-78) 110/48 (06/11 0708)  Heart Rate:  [101-124] 104 (06/11 0708)  Respirations:  [16-20] 18 (06/11 0708)  Pain Score: 0 (06/11 0708)  O2 Device: Nasal cannula (06/11 0708)  O2 Flow Rate (L/min):  [3 l/min-4 l/min] 3 l/min (06/11 0708)  SpO2:  [94 %-99 %] 96 % (06/11 0708)    Most Recent Vital Signs:   06/22/17  2344 06/23/17  0356 06/23/17  0426 06/23/17  0708   BP: 140/56 125/69  110/48   Pulse: 109 103 105 104   Resp: 20 18  18    Temp: 97.4 F (36.3 C) 98.4 F (36.9 C)  98.2 F (36.8 C)   SpO2: 95% 96%  96%     Intake/Output:  06/10 0600 - 06/11 0559  In: 780.6  [P.O.:720; I.V.:60.6]  Out: 4782 [Urine:1350; Other:5500]  Net: -6069.4  06/22/2017 0600 - 06/23/2017 0600  Urine x0  Stool x2  Emesis x0    Wt Readings from Last 3 Encounters:   06/23/17 60.6 kg (133 lb 9.6 oz)   01/21/17 58.9 kg (129 lb 14.4 oz)   10/15/16 67.1 kg (148 lb)     Diet Therapeutic; Renal Restricted; Renal 60g Protein/ 2g Na/ 2g K+/ 1g Phos  _____________________________________________________________________________    Labs reviewed; significant for:  Recent Labs     06/21/17  0644 06/22/17  0555 06/23/17  0400   NA 131* 129* 129*   K 3.0* 3.6 3.2*   CL 87* 86* 85*   BICARB 25 25 25    BUN 122* 126* 128*   CREAT 2.71* 2.84* 2.74*   GFRNON 17 17 17    GLU 105* 85 125*   ANION 19* 18* 19*   [place] 9.0 8.5 8.4*   MG 2.4 2.3 2.4   PHOS 7.0* 6.1* 5.9*     Recent Labs     06/22/17  0555   WBC 5.3   HGB 8.6*   HCT 27.8*   PLT 86*   _____________________________________________________________________________  Microbiology  Lab Results   Component Value Date    BCRES No Growth 05/16/2017     Lab Results   Component Value Date    URINECULTURE (A) 06/11/2016     Staphylococcus aureus  >100,000 colonies/mL  Two colony types; same susceptibility pattern  Identification performed by [employer]( [facility]). This test  was developed and its performance characteristics determined by [facility]. It has not been cleared or  approved by the U.S. Food and Drug Administration.  The FDA has  determined that such clearance or approval is not necessary.       Lab Results   Component Value Date    GSRES  02/22/2013     Rare Gram positive cocci  Rare yeast  (Performed at [facility]       Lab Results   Component Value Date    CXRES Moderate Normal Respiratory Flora (A) 06/15/2011    CXRES Moraxella (Branhamella) catarrhalis  Heavy 06/15/2011    CXRES Streptococcus group C  Rare 06/15/2011     _____________________________________________________________________________  Medications reviewed    epoetin alfa-epbx  4,000 Units Once per day on Fri    furosemide  100 mg 4x Daily    medroxyPROGESTERone  1.25 mg Q24H NR    metolazone  5 mg Q24H    Prostacyclin Cassette Change   Daily    Prostacyclin Tubing   Once per day on Mon Wed Fri    Prostacylin Batteries   Once per day on Mon    sevelamer carbonate  1,600 mg TID WC    sildenafil  80 mg TID    sodium chloride (PF)  3 mL Q8H    spironolactone  75 mg Daily      DOPamine 3 mcg/kg/min (  06/22/17 0940)    epoprostenol (FLOLAN) infusion 35 ng/kg/min (06/22/17 1316)    sodium chloride        acetaminophen  650 mg Q6H PRN    docusate sodium  250 mg Daily PRN    lidocaine  5 mL Once PRN    loperamide  2 mg TID PRN    nalOXone  0.1 mg Q2 Min PRN    polyethylene glycol  17 g Daily PRN    senna  2 tablet Daily PRN    simethicone  80 mg Q6H PRN    sodium chloride (PF)  10 mL PRN    sodium chloride (PF)  3 mL PRN    sodium chloride   Continuous PRN     _____________________________________________________________________________  Imaging reviewed  IR Paracentesis  Narrative: CLINICAL HISTORY:  67 year old female inpatient with pulmonary hypertension, admitted with acute on chronic right heart failure and volume overload, AKI and ascites.. IR consulted for therapeutic image guided paracentesis.    PROCEDURE:  Ultrasound guided paracentesis.    CONSENT:  The risks, benefits, and alternatives to the procedure were discussed with the patient. The patient verbalized understanding and the desire to proceed. All questions were answered and written informed consent was obtained.    PROCEDURE DESCRIPTION:  A time-out was performed. [place] evaluation of the abdomen identified a large amount of ascites in the lower abdomen. The left lower quadrant abdominal region was prepped and draped in standard sterile fashion. Local lidocaine was administered. Under US guidance, a DSA needle was advanced into the ascites. After aspiration of fluid to confirm location, the  5-French catheter was advanced off the needle and into the collection. 5.5 L of thin dark amber, blood-tinged fluid was aspirated. The catheter was then removed without complication. The procedure was well tolerated.    OPERATORS:  Luna Kitchens, MD (attending)  Bennye Alm NP    MEDICATIONS:  Lidocaine local.    PLAN:  1. Pt tolerated procedure without issue. Clinically transfer back to the floor.  2. IR available for consultation as needed.    CONCURRENT SUPERVISION:  Dr. Ladell Heads, the attending, was available for the procedure and has reviewed the images and report.           Preliminary created by: Bennye Alm   Impression: IMPRESSION:  Successful US-guided paracentesis yielding 5.5 L of dark amber blood-tinged fluid without evidence of immediate complication.  X-Ray Chest Single View  Narrative: EXAM DESCRIPTION:  X-RAY CHEST SINGLE VIEW    CLINICAL HISTORY:  Line placement    COMPARISON:  05/16/2017 chest radiograph    FINDINGS:  See impression.             Signed by: [place] Constable 06/22/2017 11:48:49  Impression: IMPRESSION:  New right arm PICC followed to the superior cavoatrial junction.    Redemonstration of a left transjugular approach central venous line followed to the upper right atrium.    No pleural effusion or pneumothorax demonstrated.    Persistently elevated right hemidiaphragm.    No consolidation.    Redemonstration of a large cardiac silhouette and large central pulmonary arteries.    Atherosclerotic calcifications.    No acute osseous abnormality identified.    _____________________________________________________________________________  Allergies  Allergies   Allergen Reactions    Allopurinol Hives    Levaquin Swelling and Other     Severe joint pain      Ofloxacin Nausea and Vomiting    Chlorhexidine Itching    Sulfa Drugs Unspecified  _____________________________________________________________________________    NEPHROLOGY ATTENDING PROGRESS  NOTE    Subjective  Fellow/resident's interval history reviewed, patient interviewed and examined.  Interval history as noted in fellow/resident 's note.     Objective  I have reviewed the vitals and  examined the patient and agree with the fellow/resident's findings.     Labs and radiology results were reviewed     Assessment/Plan    The fellow/resident's note reflects our discussion of all pertinent patient data and the treatment plan. Please see fellow/resident's note for further details.    Oda Cogan, MD  Nephrology Attending  [facility] [id number]  Pager [phone]

## 2017-06-22 NOTE — Plan of Care (Signed)
Problem: Promotion of Health and Safety  Goal: Promotion of Health and Safety  Description  The patient remains safe, receives appropriate treatment and achieves optimal outcomes (physically, psychosocially, and spiritually) within the limitations of the disease process by discharge.    Information below is the current care plan.  Outcome: Progressing  Flowsheets  Taken 06/22/2017 0830  Patient /Family stated Goal: "To feel better and get off the steroids"  Taken 06/22/2017 1822  Individualized Interventions/Recommendations #1: Monitor strict I/O's and maintain 1500 ml fluid restriction to assess for fluid balance  Individualized Interventions/Recommendations #2 (if applicable): Provide emotional support and involve pt/pt's in POCfor today  Individualized Interventions/Recommendations #3 (if applicable): Coordinate with transport to facilitate pt's parencentesis  Individualized Interventions/Recommendations #4 (if applicable): Administer diuretics as ordered and monitor for pt's tolerance and effectiveness of medication  Individualized Interventions/Recommendations #5 (if applicable): Monitor for pt's tolerance and possible side effects while on Dopamine gtt and flolan infusion  Outcome Evaluation (rationale for progressing/not progressing) every shift: Pt very emotional and teary today related to steroid therapy but Dr. [name] able to wean pt off steroids today.  Pt reports much relief after parencentsis and 5L removed.  Pt tolerating doamine/flolan with stable vitals.  Safety maintained this shift.

## 2017-06-23 LAB — BASIC METABOLIC PANEL, BLOOD
Anion Gap: 19 mmol/L — ABNORMAL HIGH (ref 7–15)
BUN: 128 mg/dL — ABNORMAL HIGH (ref 8–23)
Bicarbonate: 25 mmol/L (ref 22–29)
Calcium: 8.4 mg/dL — ABNORMAL LOW (ref 8.5–10.6)
Chloride: 85 mmol/L — ABNORMAL LOW (ref 98–107)
Creatinine: 2.74 mg/dL — ABNORMAL HIGH (ref 0.51–0.95)
GFR: 17 mL/min
Glucose: 125 mg/dL — ABNORMAL HIGH (ref 70–99)
Potassium: 3.2 mmol/L — ABNORMAL LOW (ref 3.5–5.1)
Sodium: 129 mmol/L — ABNORMAL LOW (ref 136–145)

## 2017-06-23 LAB — URIC ACID, BLOOD: Uric Acid: 14.2 mg/dL — ABNORMAL HIGH (ref 2.4–5.7)

## 2017-06-23 LAB — PHOSPHORUS, BLOOD: Phosphorous: 5.9 mg/dL — ABNORMAL HIGH (ref 2.7–4.5)

## 2017-06-23 LAB — MAGNESIUM, BLOOD: Magnesium: 2.4 mg/dL (ref 1.6–2.4)

## 2017-06-23 MED ORDER — POLYETHYLENE GLYCOL 3350 OR PACK
17.00 g | PACK | Freq: Every day | ORAL | Status: DC
Start: 2017-06-24 — End: 2017-06-26
  Filled 2017-06-23: qty 1

## 2017-06-23 MED ORDER — DOCUSATE SODIUM 250 MG OR CAPS
250.00 mg | ORAL_CAPSULE | Freq: Every day | ORAL | Status: DC
Start: 2017-06-23 — End: 2017-06-26
  Filled 2017-06-23: qty 1

## 2017-06-23 MED ORDER — MAGNESIUM HYDROXIDE 400 MG/5ML OR SUSP
30.00 mL | Freq: Once | ORAL | Status: AC
Start: 2017-06-23 — End: 2017-06-23
  Administered 2017-06-23: 30 mL via ORAL
  Filled 2017-06-23: qty 30

## 2017-06-23 MED ORDER — POTASSIUM CHLORIDE CRYS CR 20 MEQ OR TBCR
40.00 meq | EXTENDED_RELEASE_TABLET | Freq: Once | ORAL | Status: AC
Start: 2017-06-23 — End: 2017-06-23
  Administered 2017-06-23: 40 meq via ORAL
  Filled 2017-06-23: qty 2

## 2017-06-23 MED ORDER — BISACODYL 10 MG RE SUPP
10.00 mg | Freq: Every day | RECTAL | Status: DC | PRN
Start: 2017-06-23 — End: 2017-07-15
  Administered 2017-06-23: 10 mg via RECTAL
  Filled 2017-06-23: qty 1

## 2017-06-23 NOTE — Interdisciplinary (Signed)
06/23/17 1718   Follow Up/Progress   Supplies/Services  IV infusion/antibiotics   Barriers to Discharge Awaiting clinical improvement   Patient/Family/Legal/Surrogate Decision Maker Has Been Given Options And Choice In The Selection of Post-Acute Care Providers Not Applicable   Family/Caregiver's Assessed for Readiness, willingness, and ability to provide or support self-management activities   Respite Care Not Applicable   Patient/Family Are In Agreement With Discharge Plan Other (Comment)   Public Health Clearance Needed No   Plan/Interventions Explore needs and options for aftercare, provide referrals;Ongoing weekly therapeutic interventions     Medical necessity Reason for continued Hospital Stay:  acute on chronic right heart failure and volume overload as well as AKI and thrombocytopenia.Persistent edema.  ?  Interventions requiring continued Hospitalization/Plan of Care:  -Cont lasix IVP diuretic 4x daily   -Cont dopamine gtt  - Continuous supplemental O2 @ 2-3LPM per NC  - Cont home flolan gtt  ?  Anticipated discharge plan at this time (home, SNF, etc.):  Home w/ HH  ?  Barriers to Discharge:  None.  ?  Expected DC Date:  TBD.    Plan to continue treatment with diuretics, until patient is adequately diuresed.  Pt is currently on service with [employer], for Flolan gtt. Pt on service with [employer], for Oxygen DME. CM to confirm service resumption with both companies, upon discharge. CM to continue to follow clinical progress.  DC pending clinical improvement.  Pt to f/u w/ PCP for [facility] RN referral, upon return to [place].  Pt's husband to transport, upon discharge.

## 2017-06-23 NOTE — Plan of Care (Signed)
Problem: Promotion of Health and Safety  Goal: Promotion of Health and Safety  Description  The patient remains safe, receives appropriate treatment and achieves optimal outcomes (physically, psychosocially, and spiritually) within the limitations of the disease process by discharge.    Information below is the current care plan.  Outcome: Progressing  Flowsheets  Taken 06/22/2017 2130 by Mercy Moore, RN  Patient /Family stated Goal: For kidney function to improve  Taken 06/22/2017 1822 by Vinie Sill, RN  Individualized Interventions/Recommendations #1: Monitor strict I/O's and maintain 1500 ml fluid restriction to assess for fluid balance  Individualized Interventions/Recommendations #4 (if applicable): Administer diuretics as ordered and monitor for pt's tolerance and effectiveness of medication  Individualized Interventions/Recommendations #5 (if applicable): Monitor for pt's tolerance and possible side effects while on Dopamine gtt and flolan infusion  Taken 06/23/2017 0310 by Mercy Moore, RN  Individualized Interventions/Recommendations #2 (if applicable): Monitor respiratory function, provide 02 PRN.  Individualized Interventions/Recommendations #3 (if applicable): Monitor paracentesis site for leaking/bleeding. Provide pain relief PRN for tenderness and site.  Outcome Evaluation (rationale for progressing/not progressing) every shift: Vitals stable, breathing is regular and non labored, remains on 3L 02 via NC. Paracentesis site is C/D/I and free of ss of bleeding/leaking. Tylenol provided for mild pain. Diuretics administered per MD orders, strict I/O's and daily weights recorded. Pt is resting comfortably in NAD.

## 2017-06-23 NOTE — Progress Notes (Signed)
PULMONARY/CRITICAL CARE ATTENDING NOTE  Current Hospital 38 Days 10 Hours     Subjective:   No acute issues overnight. Tolerated para - removed 5.5L. Feels much better, but endorses constipation. Suppository given this AM.    Medications:  Scheduled Meds   docusate sodium  250 mg Daily    epoetin alfa-epbx  4,000 Units Once per day on Fri    furosemide  100 mg 4x Daily    medroxyPROGESTERone  1.25 mg Q24H NR    Prostacyclin Cassette Change   Daily    Prostacyclin Tubing   Once per day on Mon Wed Fri    Prostacylin Batteries   Once per day on Mon    sevelamer carbonate  1,600 mg TID WC    sildenafil  80 mg TID    sodium chloride (PF)  3 mL Q8H    spironolactone  75 mg Daily     PRN Meds   acetaminophen  650 mg Q6H PRN    bisacodyl  10 mg Daily PRN    lidocaine  5 mL Once PRN    loperamide  2 mg TID PRN    nalOXone  0.1 mg Q2 Min PRN    polyethylene glycol  17 g Daily PRN    senna  2 tablet Daily PRN    simethicone  80 mg Q6H PRN    sodium chloride (PF)  10 mL PRN    sodium chloride (PF)  3 mL PRN    sodium chloride   Continuous PRN     IV Meds   DOPamine 3 mcg/kg/min (06/23/17 0720)    epoprostenol (FLOLAN) infusion 35 ng/kg/min (06/23/17 0720)    sodium chloride         Vitals:    Latest Entry  Range (last 24 hours)    Temperature: 98.2 F (36.8 C)  Temp  Avg: 98.6 F (37 C)  Min: 98.1 F (36.7 C)  Max: 98.8 F (37.1 C)    Blood pressure (BP): 110/48  BP  Min: 109/61  Max: 133/70    Heart Rate: 104  Pulse  Avg: 103.1  Min: 96  Max: 114    Respirations: 18  Resp  Avg: 18.7  Min: 18  Max: 20    SpO2: 96 %  SpO2  Avg: 97.8 %  Min: 95 %  Max: 99 %       No data recorded     Weight: 60.6 kg (133 lb 9.6 oz)  Percentage Weight Change (%): -7.93 %    V       Intake/Output Summary (Last 24 hours) at 06/19/2017 1113  Last data filed at 06/19/2017 0827  Gross per 24 hour   Intake 968.64 ml   Output 1350 ml   Net -381.36 ml       Exam:   Gen: NAD, comfortable in chair  NECK: + JVD  CV: RRR, + TR no  S3  RESP: CTAB, normal wob  ABD: distended + ascites  EXT:2+ LE edema  NEURO: non-focal, grossly intact  SKIN: Plethoric with multiple UE ecchymoses    Labs:   CBC  Recent Labs     06/17/17  0611 06/19/17  0608   WBC 3.8* 3.9*   HGB 8.4* 8.1*   HCT 27.5* 26.7*   PLT 59* 73*   SEG 68 65   LYMPHS 23 23   MONOS 3 8      Chemistry  Recent Labs     06/18/17  0606  06/19/17  0608   NA 133* 135*   K 3.6 3.3*   CL 91* 90*   BICARB 24 28   BUN 115* 117*   CREAT 2.51* 2.75*   GLU 87 91   [place] 9.0 9.0   MG 2.1 2.2   PHOS 5.7* 6.4*       ASSESSMENT/PLAN:  67 yo F with WHO I PAH associated with SLE/sjogrens, LIP and prior anorexogen use admitted with acute on chronic right heart failure and volume overload as well as AKI and thrombocytopenia.    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 Currently with with decreasing output and rising Cr, suspect this will improve with continued diuresis.    This patient was seen and discussed with my attending Dr. Joyce Gross, MD  Fellow, Pulmonary and [facility]    Pulmonary Vascular Attending Addendum:  Patient was examined with Dr. Artist Beach, imaging and tests reviewed.  Please see above note for full details and plan.    67 yo F with WHO I PAH associated with SLE/sjogrens, LIPand prior anorexogen use admitted with acute on chronic right heart failure and volume overload as well as AKI and thrombocytopenia.    Wtworse. Exam worsedeespite dopamineand  ongoing rx with lasix IV, metolazone. Cr also worse.    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  Epogen Qweek.  - Bowel regimen    [name] M.D.  Pulmonary and Critical Care Attending  Pager [phone]

## 2017-06-23 NOTE — Plan of Care (Signed)
Problem: Promotion of Health and Safety  Goal: Promotion of Health and Safety  Description  The patient remains safe, receives appropriate treatment and achieves optimal outcomes (physically, psychosocially, and spiritually) within the limitations of the disease process by discharge.    Information below is the current care plan.  Outcome: Progressing  Flowsheets  Taken 06/23/2017 0730 by Vinie Sill, RN  Patient /Family stated Goal: Have a BM, get the fluid off  Taken 06/22/2017 0204 by Berniece Salines, RN  Guidelines: Inpatient Nursing Guidelines  Taken 06/22/2017 1822 by Vinie Sill, RN  Individualized Interventions/Recommendations #1: Monitor strict I/O's and maintain 1500 ml fluid restriction to assess for fluid balance  Individualized Interventions/Recommendations #4 (if applicable): Administer diuretics as ordered and monitor for pt's tolerance and effectiveness of medication  Individualized Interventions/Recommendations #5 (if applicable): Monitor for pt's tolerance and possible side effects while on Dopamine gtt and flolan infusion  Taken 06/23/2017 1833 by Vinie Sill, RN  Individualized Interventions/Recommendations #2 (if applicable): Administer bowel medications and encourage pt to eat high fiber foods to help facilitate a BM  Individualized Interventions/Recommendations #3 (if applicable): Provide emotional support and reassurance to pt to help minimize anxiety and deal  Outcome Evaluation (rationale for progressing/not progressing) every shift: Pt able to have a few small BMs and passing large flatus/burping.  Pt reports relief in back and abd pain after having a BM today.  Pt with fair UOP via FC and compliant with fuid restriction.  Safety maintained this shift.

## 2017-06-23 NOTE — Progress Notes (Signed)
NEPHROLOGY PROGRESS NOTE    Chief Complaint: Edema (C/O SWELLING TO BOTH FEET FOR THE LAST 5 DAYS.  C/O ABD BLOATING.  DC'D FROM A HOSP IN [place] FOR DIARRHEA.  TAKEN OFF LASIX FOR THE LAST 5 DAYS.  [facility] [place].  PT IS A FLOLAN PT.)  Reason for Consultation: AKI evaluation  Hospital Day #39 Days 8 Hours  PrimaryTeam/ Attending Provider: Larna Daughters., MD    ID/ Assessment:   Diana Chambers is a 67 year old female w/ PMH of hx of Group 1 PAH associated with SLE and /Sjogren's disease, presented to the hospital with worsening legs swelling. 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   _____________________________________________________________________________    Plan (Today's recommendations in bold):  # Resolving AKI on CKD 2/2 compressive ascites in the setting of PAH/Rt sided HF  -sCr uptrending.  -Solumedrol daily.  -Large ascites; query compressive ascites as current etiology for AKI.  -Estimated dry weight ~129 lb, possibly lower d/t prolonged hospitalization and loss of muscle weight.  -Defer to pulmonary team regarding transitioning lasix from IV to PO.  -06/02 explained in detail the CKD stage and her risk of progression to ESRD- pt mentioned she wouldn't want dialysis if it got to that.    # Lytes:Suspect hypervolemic hyponatremia- downtrending, continue diuresis. Hypokalemia- replete per primary team.  # Volume: Hypervolemic. Recommend fluid restriction 1066ml/day. Diuresis regimen: lasix + spot bumex, spironolactone, metolazone- consider IV diuril given possible gut edema. S/p 5.5L IR paracentesis  # Acid-Base: bicarb acceptable but downtrending.  # MBD: phos elevated- can decrease renvela 1tab daily or D/C + recommend renal diet or at least low phosphorus diet.  # Anemia: Iron studies with low iron sat ~ 16, s/p Iron sucrose 200 mg IV qday x5 doses (end 06/09), Epogen 4000U SQ  qweekly.    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    # Dispo: f/u on discharge w/ Dr. Donnelly Stager in renal clinic.     Plan to sign off, please reconsult with questions.     Case seen and discussed with Dr. Adine Madura.  See attending addendum for changes in above plan.    Bonnita Levan, MD  Nephrology Fellow, PGY 4  Division of Nephrology-Hypertension   [place] [facility] [place] [place] [facility]  [phone]  [place], [place] [address]  Pager: [phone]  _____________________________________________________________________________    Interval Events: [name]- PVM/primary- suppository for constipation  Subjective: Denies CP, SOB, swelling stable. Abdominal swelling is better,. Reports nausea and poor appetite.  Review of Systems: A full ROS performed and negative outside above subjective history.  _____________________________________________________________________________    Physical Exam:  General: NAD, stting in chair,  HEENT: NC/AT  Eyes: Anicetric sclera  Mouth: MMM  CV: RRR  Pulm: Good air entry, CTAB, mild bibasilar crackles   Abd:  Soft, non tender,  distended  GU: No foley  Ext: Stable 2-3 mm edema on both LE.    24hour Vital Signs:  Temperature:  [97.9 F (36.6 C)-98.8 F (37.1 C)] 97.9 F (36.6 C) (06/12 0515)  Blood pressure (BP): (101-140)/(55-70) 101/55 (06/12 0515)  Heart Rate:  [96-107] 99 (06/12 0515)  Respirations:  [16-20] 20 (06/12 0515)  Pain Score: 0 (06/12 0515)  O2 Device: Nasal cannula (06/11 2357)  O2 Flow  Rate (L/min):  [3 l/min] 3 l/min (06/11 2357)  SpO2:  [94 %-98 %] 96 % (06/12 0515)    Most Recent Vital Signs:   06/23/17  1815 06/23/17  2023 06/23/17  2357 06/24/17  0515   BP: 140/70 110/56 119/62 101/55   Pulse:  105 107 99   Resp:  18 20 20    Temp:  98.8 F (37.1 C) 98.3 F (36.8 C) 97.9 F (36.6 C)   SpO2:  98% 95% 96%     Intake/Output:  06/11 0600 -  06/12 0559  In: 1274.5 [P.O.:990; I.V.:284.5]  Out: 1976 [ZDGUY:4034; Stool:1]  Net: -701.5  06/23/2017 0600 - 06/24/2017 0600  Urine x5  Stool x7  Emesis x0    Wt Readings from Last 3 Encounters:   06/24/17 60.3 kg (132 lb 15 oz)   01/21/17 58.9 kg (129 lb 14.4 oz)   10/15/16 67.1 kg (148 lb)     Diet Therapeutic; Renal Restricted; Renal 60g Protein/ 2g Na/ 2g K+/ 1g Phos  _____________________________________________________________________________    Labs reviewed; significant for:  Recent Labs     06/22/17  0555 06/23/17  0400 06/24/17  0500   NA 129* 129* 126*   K 3.6 3.2* 3.0*   CL 86* 85* 83*   BICARB 25 25 26    BUN 126* 128* 124*   CREAT 2.84* 2.74* 1.26*   GFRNON 17 17 42   GLU 85 125* 129*   ANION 18* 19* 17*    8.5 8.4* 8.2*   MG 2.3 2.4 2.5*   PHOS 6.1* 5.9* 5.7*     Recent Labs     06/22/17  0555 06/24/17  0500   WBC 5.3 7.4   HGB 8.6* 8.6*   HCT 27.8* 26.8*   PLT 86* 60*   _____________________________________________________________________________  Microbiology  Lab Results   Component Value Date    BCRES No Growth 05/16/2017     Lab Results   Component Value Date    URINECULTURE (A) 06/11/2016     Staphylococcus aureus  >100,000 colonies/mL  Two colony types; same susceptibility pattern  Identification performed by [employer]( [facility]). This test  was developed and its performance characteristics determined by [facility]. It has not been cleared or  approved by the U.S. Food and Drug Administration.  The FDA has  determined that such clearance or approval is not necessary.       Lab Results   Component Value Date    GSRES  02/22/2013     Rare Gram positive cocci  Rare yeast  (Performed at [facility]       Lab Results   Component Value Date    CXRES Moderate Normal Respiratory Flora (A) 06/15/2011    CXRES Moraxella (Branhamella) catarrhalis  Heavy 06/15/2011    CXRES Streptococcus group C  Rare 06/15/2011          _____________________________________________________________________________  Medications reviewed   docusate sodium  250 mg Daily    epoetin alfa-epbx  4,000 Units Once per day on Fri    furosemide  100 mg 4x Daily    medroxyPROGESTERone  1.25 mg Q24H NR    polyethylene glycol  17 g Daily    Prostacyclin Cassette Change   Daily    Prostacyclin Tubing   Once per day on Mon Wed Fri    Prostacylin Batteries   Once per day on Mon    sevelamer carbonate  1,600 mg TID WC  sildenafil  80 mg TID    sodium chloride (PF)  3 mL Q8H    spironolactone  75 mg Daily      DOPamine 3 mcg/kg/min (06/24/17 0551)    epoprostenol (FLOLAN) infusion 35 ng/kg/min (06/24/17 0551)    sodium chloride        acetaminophen  650 mg Q6H PRN    bisacodyl  10 mg Daily PRN    nalOXone  0.1 mg Q2 Min PRN    senna  2 tablet Daily PRN    simethicone  80 mg Q6H PRN    sodium chloride (PF)  10 mL PRN    sodium chloride (PF)  3 mL PRN    sodium chloride   Continuous PRN     _____________________________________________________________________________  Imaging reviewed  IR Paracentesis  Narrative: CLINICAL HISTORY:  67 year old female inpatient with pulmonary hypertension, admitted with acute on chronic right heart failure and volume overload, AKI and ascites.. IR consulted for therapeutic image guided paracentesis.    PROCEDURE:  Ultrasound guided paracentesis.    CONSENT:  The risks, benefits, and alternatives to the procedure were discussed with the patient. The patient verbalized understanding and the desire to proceed. All questions were answered and written informed consent was obtained.    PROCEDURE DESCRIPTION:  A time-out was performed. [place] evaluation of the abdomen identified a large amount of ascites in the lower abdomen. The left lower quadrant abdominal region was prepped and draped in standard sterile fashion. Local lidocaine was administered. Under US guidance, a DSA needle was advanced into the ascites. After  aspiration of fluid to confirm location, the 5-French catheter was advanced off the needle and into the collection. 5.5 L of thin dark amber, blood-tinged fluid was aspirated. The catheter was then removed without complication. The procedure was well tolerated.    OPERATORS:  Luna Kitchens, MD (attending)  Bennye Alm NP    MEDICATIONS:  Lidocaine local.    PLAN:  1. Pt tolerated procedure without issue. Clinically transfer back to the floor.  2. IR available for consultation as needed.    CONCURRENT SUPERVISION:  Dr. Ladell Heads, the attending, was available for the procedure and has reviewed the images and report.           Preliminary created by: Bennye Alm   Impression: IMPRESSION:  Successful US-guided paracentesis yielding 5.5 L of dark amber blood-tinged fluid without evidence of immediate complication.  X-Ray Chest Single View  Narrative: EXAM DESCRIPTION:  X-RAY CHEST SINGLE VIEW    CLINICAL HISTORY:  Line placement    COMPARISON:  05/16/2017 chest radiograph    FINDINGS:  See impression.             Signed by: [place] Constable 06/22/2017 11:48:49  Impression: IMPRESSION:  New right arm PICC followed to the superior cavoatrial junction.    Redemonstration of a left transjugular approach central venous line followed to the upper right atrium.    No pleural effusion or pneumothorax demonstrated.    Persistently elevated right hemidiaphragm.    No consolidation.    Redemonstration of a large cardiac silhouette and large central pulmonary arteries.    Atherosclerotic calcifications.    No acute osseous abnormality identified.    _____________________________________________________________________________  Allergies  Allergies   Allergen Reactions    Allopurinol Hives    Levaquin Swelling and Other     Severe joint pain      Ofloxacin Nausea and Vomiting    Chlorhexidine Itching    Sulfa  Drugs Unspecified     _____________________________________________________________________________    NEPHROLOGY ATTENDING  PROGRESS NOTE    Subjective  Fellow/resident's interval history reviewed, patient interviewed and examined.  Interval history as noted in fellow/resident 's note.     Objective  I have reviewed the vitals and  examined the patient and agree with the fellow/resident's findings.     Labs and radiology results were reviewed     Assessment/Plan    The fellow/resident's note reflects our discussion of all pertinent patient data and the treatment plan. Please see fellow/resident's note for further details.    Oda Cogan, MD  Nephrology Attending  [place] [id number]  Pager [phone]

## 2017-06-23 NOTE — Plan of Care (Signed)
Problem: Promotion of Health and Safety  Goal: Promotion of Health and Safety  Description  The patient remains safe, receives appropriate treatment and achieves optimal outcomes (physically, psychosocially, and spiritually) within the limitations of the disease process by discharge.    Information below is the current care plan.  Outcome: Progressing  Flowsheets  Taken 06/23/2017 2000 by Mercy Moore, RN  Patient /Family stated Goal: Sleep well and feel better  Taken 06/22/2017 1822 by Vinie Sill, RN  Individualized Interventions/Recommendations #1: Monitor strict I/O's and maintain 1500 ml fluid restriction to assess for fluid balance  Individualized Interventions/Recommendations #4 (if applicable): Administer diuretics as ordered and monitor for pt's tolerance and effectiveness of medication  Individualized Interventions/Recommendations #5 (if applicable): Monitor for pt's tolerance and possible side effects while on Dopamine gtt and flolan infusion  Taken 06/23/2017 1833 by Vinie Sill, RN  Individualized Interventions/Recommendations #3 (if applicable): Provide emotional support and reassurance to pt to help minimize anxiety and deal  Taken 06/23/2017 2346 by Mercy Moore, RN  Outcome Evaluation (rationale for progressing/not progressing) every shift: Pt fatigued and emotional this evening. Emotional support provided by RN, son remains bedside with pt. No respiratory distress noted. Pt tolerating Dopamine and flolan. Pt mindful of fluid restrictions. Strict I/O's and daily weights charted. Pt is sleeping comfortably in NAD.

## 2017-06-24 LAB — CBC WITH DIFF, BLOOD
ANC-Automated: 5.8 10*3/uL (ref 1.6–7.0)
Abs Basophils: 0 10*3/uL (ref <0.1)
Abs Eosinophils: 0 10*3/uL (ref 0.1–0.5)
Abs Lymphs: 1 10*3/uL (ref 0.8–3.1)
Abs Monos: 0.3 10*3/uL (ref 0.2–0.8)
Basophils: 0 %
Eosinophils: 0 %
Hct: 26.8 % — ABNORMAL LOW (ref 34.0–45.0)
Hgb: 8.6 gm/dL — ABNORMAL LOW (ref 11.2–15.7)
Imm Gran %: 2 % — ABNORMAL HIGH (ref <1)
Imm Gran Abs: 0.2 10*3/uL — ABNORMAL HIGH (ref <0.1)
Lymphocytes: 14 %
MCH: 25.9 pg — ABNORMAL LOW (ref 26.0–32.0)
MCHC: 32.1 g/dL (ref 32.0–36.0)
MCV: 80.7 um3 (ref 79.0–95.0)
Monocytes: 5 %
Plt Count: 60 10*3/uL — ABNORMAL LOW (ref 140–370)
RBC: 3.32 10*6/uL — ABNORMAL LOW (ref 3.90–5.20)
RDW: 18.9 % — ABNORMAL HIGH (ref 12.0–14.0)
Segs: 79 %
WBC: 7.4 10*3/uL (ref 4.0–10.0)

## 2017-06-24 LAB — BASIC METABOLIC PANEL, BLOOD
Anion Gap: 17 mmol/L — ABNORMAL HIGH (ref 7–15)
Anion Gap: 18 mmol/L — ABNORMAL HIGH (ref 7–15)
BUN: 124 mg/dL — ABNORMAL HIGH (ref 8–23)
BUN: 129 mg/dL — ABNORMAL HIGH (ref 8–23)
Bicarbonate: 26 mmol/L (ref 22–29)
Bicarbonate: 27 mmol/L (ref 22–29)
Calcium: 8.2 mg/dL — ABNORMAL LOW (ref 8.5–10.6)
Calcium: 8.5 mg/dL (ref 8.5–10.6)
Chloride: 83 mmol/L — ABNORMAL LOW (ref 98–107)
Chloride: 82 mmol/L — ABNORMAL LOW (ref 98–107)
Creatinine: 1.26 mg/dL — ABNORMAL HIGH (ref 0.51–0.95)
Creatinine: 2.39 mg/dL — ABNORMAL HIGH (ref 0.51–0.95)
GFR: 42 mL/min
GFR: 20 mL/min
Glucose: 129 mg/dL — ABNORMAL HIGH (ref 70–99)
Glucose: 94 mg/dL (ref 70–99)
Potassium: 3 mmol/L — ABNORMAL LOW (ref 3.5–5.1)
Potassium: 3.1 mmol/L — ABNORMAL LOW (ref 3.5–5.1)
Sodium: 126 mmol/L — ABNORMAL LOW (ref 136–145)
Sodium: 127 mmol/L — ABNORMAL LOW (ref 136–145)

## 2017-06-24 LAB — MAGNESIUM, BLOOD: Magnesium: 2.5 mg/dL — ABNORMAL HIGH (ref 1.6–2.4)

## 2017-06-24 LAB — PHOSPHORUS, BLOOD: Phosphorous: 5.7 mg/dL — ABNORMAL HIGH (ref 2.7–4.5)

## 2017-06-24 MED ORDER — ONDANSETRON HCL 4 MG/2ML IV SOLN
4.00 mg | Freq: Four times a day (QID) | INTRAMUSCULAR | Status: DC | PRN
Start: 2017-06-24 — End: 2017-07-15
  Administered 2017-06-26 – 2017-07-13 (×5): 4 mg via INTRAVENOUS
  Filled 2017-06-24 (×6): qty 2

## 2017-06-24 MED ORDER — POTASSIUM CHLORIDE 20 MEQ/50ML IV SOLN
20.00 meq | Freq: Once | INTRAVENOUS | Status: AC
Start: 2017-06-24 — End: 2017-06-24
  Administered 2017-06-24: 20 meq via INTRAVENOUS
  Filled 2017-06-24: qty 50

## 2017-06-24 MED ORDER — SEVELAMER CARBONATE 800 MG OR TABS
800.00 mg | ORAL_TABLET | Freq: Three times a day (TID) | ORAL | Status: DC
Start: 2017-06-24 — End: 2017-06-24

## 2017-06-24 MED ORDER — BUMETANIDE 0.25 MG/ML IJ SOLN
4.00 mg | Freq: Once | INTRAMUSCULAR | Status: AC
Start: 2017-06-24 — End: 2017-06-24
  Administered 2017-06-24: 4 mg via INTRAVENOUS
  Filled 2017-06-24: qty 16

## 2017-06-24 NOTE — Progress Notes (Signed)
PULMONARY/CRITICAL CARE ATTENDING NOTE  Current Hospital 39 Days 14 Hours     Subjective:   Nausea overnight with increased renvela dose, held - will resume at lower dose. Breathing easier since para - agreeable to keep trialing bumex while in-house.    Medications:  Scheduled Meds   docusate sodium  250 mg Daily    epoetin alfa-epbx  4,000 Units Once per day on Fri    furosemide  100 mg 4x Daily    medroxyPROGESTERone  1.25 mg Q24H NR    polyethylene glycol  17 g Daily    potassium chloride  20 mEq Once    Prostacyclin Cassette Change   Daily    Prostacyclin Tubing   Once per day on Mon Wed Fri    Prostacylin Batteries   Once per day on Mon    sildenafil  80 mg TID    sodium chloride (PF)  3 mL Q8H    spironolactone  75 mg Daily     PRN Meds   acetaminophen  650 mg Q6H PRN    bisacodyl  10 mg Daily PRN    nalOXone  0.1 mg Q2 Min PRN    ondansetron  4 mg Q6H PRN    senna  2 tablet Daily PRN    simethicone  80 mg Q6H PRN    sodium chloride (PF)  10 mL PRN    sodium chloride (PF)  3 mL PRN    sodium chloride   Continuous PRN     IV Meds   DOPamine 3 mcg/kg/min (06/24/17 0710)    epoprostenol (FLOLAN) infusion 35 ng/kg/min (06/24/17 0710)    sodium chloride         Vitals:    Latest Entry  Range (last 24 hours)    Temperature: 98.6 F (37 C)  Temp  Avg: 98.6 F (37 C)  Min: 98.1 F (36.7 C)  Max: 98.8 F (37.1 C)    Blood pressure (BP): 114/59  BP  Min: 109/61  Max: 133/70    Heart Rate: 88  Pulse  Avg: 103.1  Min: 96  Max: 114    Respirations: 18  Resp  Avg: 18.7  Min: 18  Max: 20    SpO2: 96 %  SpO2  Avg: 97.8 %  Min: 95 %  Max: 99 %       No data recorded     Weight: 60.3 kg (132 lb 15 oz)  Percentage Weight Change (%): -0.49 %    V       Intake/Output Summary (Last 24 hours) at 06/19/2017 1113  Last data filed at 06/19/2017 0827  Gross per 24 hour   Intake 968.64 ml   Output 1350 ml   Net -381.36 ml       Exam:   Gen: NAD, comfortable in chair  NECK: + JVD  CV: RRR, + TR no S3  RESP: CTAB,  normal wob  ABD: distended + ascites  EXT:2+ LE edema  NEURO: non-focal, grossly intact  SKIN: Plethoric with multiple UE ecchymoses    Labs:   CBC  Recent Labs     06/17/17  0611 06/19/17  0608   WBC 3.8* 3.9*   HGB 8.4* 8.1*   HCT 27.5* 26.7*   PLT 59* 73*   SEG 68 65   LYMPHS 23 23   MONOS 3 8      Chemistry  Recent Labs     06/18/17  0606 06/19/17  2542  NA 133* 135*   K 3.6 3.3*   CL 91* 90*   BICARB 24 28   BUN 115* 117*   CREAT 2.51* 2.75*   GLU 87 91   [place] 9.0 9.0   MG 2.1 2.2   PHOS 5.7* 6.4*       ASSESSMENT/PLAN:  67 yo F with WHO I PAH associated with SLE/sjogrens, LIP and prior anorexogen use admitted with acute on chronic right heart failure and volume overload as well as AKI and thrombocytopenia.    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 Currently with with decreasing output and rising Cr, suspect this will improve with continued diuresis.    This patient was seen and discussed with my attending Dr. Joyce Gross, MD  Fellow, Pulmonary and [facility]      Pulmonary Vascular Attending Addendum:  Patient was examined withDr. Artist Beach, imaging and tests reviewed.  Please see above note for full details and plan.    67 yo F with WHO I PAH associated with SLE/sjogrens, LIPand prior anorexogen use admitted with acute on chronic right heart failure and volume  overload as well as AKI and thrombocytopenia.    Wtstable. Exam stable but stil significant edema.  Nausea and vomiting overnight improved.    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 Epogen Qweek.    [name] M.D.  Pulmonary and Critical Care Attending  Pager [phone]

## 2017-06-24 NOTE — Plan of Care (Signed)
Problem: Promotion of Health and Safety  Goal: Promotion of Health and Safety  Description  The patient remains safe, receives appropriate treatment and achieves optimal outcomes (physically, psychosocially, and spiritually) within the limitations of the disease process by discharge.    Information below is the current care plan.  Flowsheets  Taken 06/24/2017 1000  Patient /Family stated Goal: Get rid of my nausea and eat better  Taken 06/22/2017 1822  Individualized Interventions/Recommendations #1: Monitor strict I/O's and maintain 1500 ml fluid restriction to assess for fluid balance  Individualized Interventions/Recommendations #4 (if applicable): Administer diuretics as ordered and monitor for pt's tolerance and effectiveness of medication  Taken 06/24/2017 1727  Individualized Interventions/Recommendations #2 (if applicable): Administer anti-emetics and encouurage to take small amounts of food as tolerated to alleviate nausea  Individualized Interventions/Recommendations #5 (if applicable): Coordinate with pt to allow pt to go off unit and be outside with famil/supervision for diversional actvity and stress reduction  Outcome Evaluation (rationale for progressing/not progressing) every shift: Pr reports appetite slowely improving and nausea resolved after zofran given and taken off renvela medication which was likely contributing.  Pt able to go outside with son and had several visitors today which lifted pt's spirits.  UOP with sloght improvement, will continue to monitor progress.  Safety maintained this shift.  Taken 06/23/2017 1833  Individualized Interventions/Recommendations #3 (if applicable): Provide emotional support and reassurance to pt to help minimize anxiety and deal

## 2017-06-24 NOTE — Interdisciplinary (Signed)
See spiritual care intervention flow sheet for detailed info about the visit.

## 2017-06-25 LAB — BASIC METABOLIC PANEL, BLOOD
Anion Gap: 18 mmol/L — ABNORMAL HIGH (ref 7–15)
BUN: 125 mg/dL — ABNORMAL HIGH (ref 8–23)
Bicarbonate: 27 mmol/L (ref 22–29)
Calcium: 8.1 mg/dL — ABNORMAL LOW (ref 8.5–10.6)
Chloride: 82 mmol/L — ABNORMAL LOW (ref 98–107)
Creatinine: 2.07 mg/dL — ABNORMAL HIGH (ref 0.51–0.95)
GFR: 24 mL/min
Glucose: 97 mg/dL (ref 70–99)
Potassium: 3 mmol/L — ABNORMAL LOW (ref 3.5–5.1)
Sodium: 127 mmol/L — ABNORMAL LOW (ref 136–145)

## 2017-06-25 LAB — PHOSPHORUS, BLOOD: Phosphorous: 5.8 mg/dL — ABNORMAL HIGH (ref 2.7–4.5)

## 2017-06-25 LAB — MAGNESIUM, BLOOD: Magnesium: 2.5 mg/dL — ABNORMAL HIGH (ref 1.6–2.4)

## 2017-06-25 MED ORDER — METOLAZONE 5 MG OR TABS
5.00 mg | ORAL_TABLET | Freq: Every day | ORAL | Status: DC
Start: 2017-06-25 — End: 2017-07-15
  Administered 2017-06-25 – 2017-07-15 (×20): 5 mg via ORAL
  Filled 2017-06-25 (×20): qty 1

## 2017-06-25 MED ORDER — POTASSIUM CHLORIDE CRYS CR 20 MEQ OR TBCR
40.00 meq | EXTENDED_RELEASE_TABLET | Freq: Once | ORAL | Status: AC
Start: 2017-06-25 — End: 2017-06-25
  Administered 2017-06-25: 40 meq via ORAL
  Filled 2017-06-25: qty 2

## 2017-06-25 NOTE — Plan of Care (Signed)
Problem: Promotion of Health and Safety  Goal: Promotion of Health and Safety  Description  The patient remains safe, receives appropriate treatment and achieves optimal outcomes (physically, psychosocially, and spiritually) within the limitations of the disease process by discharge.    Information below is the current care plan.  Flowsheets  Taken 06/25/2017 2040 by Carlynn Spry, RN  Patient /Family stated Goal: void more  Taken 06/25/2017 1802 by Jasmine Pang, RN  Guidelines: Inpatient Nursing Guidelines  Taken 06/22/2017 1822 by Vinie Sill, RN  Individualized Interventions/Recommendations #1: Monitor strict I/O's and maintain 1500 ml fluid restriction to assess for fluid balance  Individualized Interventions/Recommendations #4 (if applicable): Administer diuretics as ordered and monitor for pt's tolerance and effectiveness of medication  Taken 06/24/2017 1727 by Vinie Sill, RN  Individualized Interventions/Recommendations #2 (if applicable): Administer anti-emetics and encouurage to take small amounts of food as tolerated to alleviate nausea  Taken 06/23/2017 1833 by Vinie Sill, RN  Individualized Interventions/Recommendations #3 (if applicable): Provide emotional support and reassurance to pt to help minimize anxiety and deal  Taken 06/25/2017 0008 by Carlynn Spry, RN  Outcome Evaluation (rationale for progressing/not progressing) every shift: pt's VS are WNL. pt complained of generalized weakness, denies any N/V. cont to diurese. foley in placed for strict intake and output. cont plan of care

## 2017-06-25 NOTE — Progress Notes (Signed)
PULMONARY/CRITICAL CARE ATTENDING NOTE  Current Hospital 40 Days 12 Hours     Subjective:   Nausea better this AM - eager to get repeat repeat para later this week.    Medications:  Scheduled Meds   docusate sodium  250 mg Daily    epoetin alfa-epbx  4,000 Units Once per day on Fri    furosemide  100 mg 4x Daily    medroxyPROGESTERone  1.25 mg Q24H NR    metolazone  5 mg Daily    polyethylene glycol  17 g Daily    Prostacyclin Cassette Change   Daily    Prostacyclin Tubing   Once per day on Mon Wed Fri    Prostacylin Batteries   Once per day on Mon    sildenafil  80 mg TID    sodium chloride (PF)  3 mL Q8H    spironolactone  75 mg Daily     PRN Meds   acetaminophen  650 mg Q6H PRN    bisacodyl  10 mg Daily PRN    nalOXone  0.1 mg Q2 Min PRN    ondansetron  4 mg Q6H PRN    senna  2 tablet Daily PRN    simethicone  80 mg Q6H PRN    sodium chloride (PF)  10 mL PRN    sodium chloride (PF)  3 mL PRN    sodium chloride   Continuous PRN     IV Meds   DOPamine 3 mcg/kg/min (06/25/17 0531)    epoprostenol (FLOLAN) infusion 35 ng/kg/min (06/25/17 0531)    sodium chloride         Vitals:    Latest Entry  Range (last 24 hours)    Temperature: 98.1 F (36.7 C)  Temp  Avg: 98.6 F (37 C)  Min: 98.1 F (36.7 C)  Max: 98.8 F (37.1 C)    Blood pressure (BP): 111/56  BP  Min: 109/61  Max: 133/70    Heart Rate: 96  Pulse  Avg: 103.1  Min: 96  Max: 114    Respirations: 20  Resp  Avg: 18.7  Min: 18  Max: 20    SpO2: 97 %  SpO2  Avg: 97.8 %  Min: 95 %  Max: 99 %       No data recorded     Weight: 60.5 kg (133 lb 6.1 oz)  Percentage Weight Change (%): 0.33 %    V       Intake/Output Summary (Last 24 hours) at 06/19/2017 1113  Last data filed at 06/19/2017 0827  Gross per 24 hour   Intake 968.64 ml   Output 1350 ml   Net -381.36 ml       Exam:   Gen: NAD, comfortable in chair  NECK: + JVD  CV: RRR, + TR no S3  RESP: CTAB, normal wob  ABD: distended + ascites  EXT:2+ LE edema  NEURO: non-focal, grossly intact  SKIN:  Plethoric with multiple UE ecchymoses    Labs:   CBC  Recent Labs     06/17/17  0611 06/19/17  0608   WBC 3.8* 3.9*   HGB 8.4* 8.1*   HCT 27.5* 26.7*   PLT 59* 73*   SEG 68 65   LYMPHS 23 23   MONOS 3 8      Chemistry  Recent Labs     06/18/17  0606 06/19/17  0608   NA 133* 135*   K 3.6 3.3*   CL 91*  90*   BICARB 24 28   BUN 115* 117*   CREAT 2.51* 2.75*   GLU 87 91   [place] 9.0 9.0   MG 2.1 2.2   PHOS 5.7* 6.4*       ASSESSMENT/PLAN:  67 yo F with WHO I PAH associated with SLE/sjogrens, LIP and prior anorexogen use admitted with acute on chronic right heart failure and volume overload as well as AKI and thrombocytopenia.    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  # AKI on CKD, baseline around 1.3  # Thrombocytopenia: stable  # Chronic Hypoxia on home oxygen  # Hyponatremia 2/2 natriuresis and FW excess  # Hyperphosphatemia    -s/p 4 day steroid course and paracentesis 5.5L on 6/10  - Lasix 100 mg QID, spot dosing bumex daily for goal -1L goal each day  - Cont dopamine 2 mcg/kg/min  - Metolazone 5mg  qday - restarted today  - repeat para ordered with IR  - Continue Spironolactone  - Continue home sildenafil and flolan at 35 ng/kg/min  - Continue oxygen suppl  - Fluid restrict to 1.5 L/day  - Iron Sucrose for iron def anemia  -foley in place for difficulty voiding. Currently with with decreasing output and rising Cr, suspect this will improve with continued diuresis.    This patient was seen and discussed with my attending Dr. Joyce Gross, MD  Fellow, Pulmonary and [facility]    Pulmonary Vascular Attending Addendum:  Patient was examined withDr. Artist Beach, imaging and tests reviewed.  Please see above note for full details and plan.    68 yo F with WHO I PAH associated with SLE/sjogrens, LIPand prior anorexogen use admitted with acute on chronic right heart failure and volume overload as well as AKI and thrombocytopenia.    Wtstable. Exam stable but  still significant edema.  Nausea and vomiting overnight improved.    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 Epogen Qweek.    [name] M.D.  Pulmonary and Critical Care Attending  Pager [phone]

## 2017-06-25 NOTE — Plan of Care (Signed)
Problem: Promotion of Health and Safety  Goal: Promotion of Health and Safety  Description  The patient remains safe, receives appropriate treatment and achieves optimal outcomes (physically, psychosocially, and spiritually) within the limitations of the disease process by discharge.    Information below is the current care plan.  Flowsheets  Taken 06/24/2017 2000 by Carlynn Spry, RN  Patient /Family stated Goal: sleep better  Taken 06/22/2017 0204 by Berniece Salines, RN  Guidelines: Inpatient Nursing Guidelines  Taken 06/22/2017 1822 by Vinie Sill, RN  Individualized Interventions/Recommendations #1: Monitor strict I/O's and maintain 1500 ml fluid restriction to assess for fluid balance  Individualized Interventions/Recommendations #4 (if applicable): Administer diuretics as ordered and monitor for pt's tolerance and effectiveness of medication  Taken 06/23/2017 1833 by Vinie Sill, RN  Individualized Interventions/Recommendations #3 (if applicable): Provide emotional support and reassurance to pt to help minimize anxiety and deal  Taken 06/24/2017 1727 by Vinie Sill, RN  Individualized Interventions/Recommendations #5 (if applicable): Coordinate with pt to allow pt to go off unit and be outside with famil/supervision for diversional actvity and stress reduction  Taken 06/25/2017 0008 by Carlynn Spry, RN  Outcome Evaluation (rationale for progressing/not progressing) every shift: pt's VS are WNL. pt complained of generalized weakness, denies any N/V. cont to diurese. foley in placed for strict intake and output. cont plan of care

## 2017-06-25 NOTE — Plan of Care (Signed)
Problem: Promotion of Health and Safety  Goal: Promotion of Health and Safety  Description  The patient remains safe, receives appropriate treatment and achieves optimal outcomes (physically, psychosocially, and spiritually) within the limitations of the disease process by discharge.    Information below is the current care plan.  Outcome: Progressing  Flowsheets  Taken 06/25/2017 0741 by Jasmine Pang, RN  Patient /Family stated Goal: get this fluid off  Taken 06/25/2017 1802 by Jasmine Pang, RN  Guidelines: Inpatient Nursing Guidelines  Taken 06/22/2017 1822 by Vinie Sill, RN  Individualized Interventions/Recommendations #1: Monitor strict I/O's and maintain 1500 ml fluid restriction to assess for fluid balance  Individualized Interventions/Recommendations #4 (if applicable): Administer diuretics as ordered and monitor for pt's tolerance and effectiveness of medication  Taken 06/23/2017 1833 by Vinie Sill, RN  Individualized Interventions/Recommendations #3 (if applicable): Provide emotional support and reassurance to pt to help minimize anxiety and deal  Taken 06/24/2017 1727 by Vinie Sill, RN  Individualized Interventions/Recommendations #5 (if applicable): Coordinate with pt to allow pt to go off unit and be outside with famil/supervision for diversional actvity and stress reduction  Note:   Pt tolerating diuretics well and denies any nausea.

## 2017-06-25 NOTE — Interdisciplinary (Signed)
06/25/17 1206   Follow Up/Progress   Is the Patient Ready for Discharge No   Supplies/Services  Oxygen;IV infusion/antibiotics;Too soon to be determined   Barriers to Discharge Awaiting clinical improvement   Family/Caregiver's Assessed for Readiness, willingness, and ability to provide or support self-management activities   Patient/Family Are In Agreement With Discharge Plan Yes   Plan/Interventions Confirm return to prior placement   Patient continues on IV lasix, dopamine gtt, flolan gtt. Oxygen at 3 l/m NC.Hyponatremia, on fluid restriction.     Case manager met with the patient. She resides in [place], [place]. Has home oxygen-purchased her own oxygen unit. Has portable O2 set up for transport home. Manages her Flolan infusion, her husband is primary support and caregiver. Patient reports she has never had home health-unable to get services as she does not have an [place] provider. Denies previous SNF placement.    Case manager following, to assist in discharge panning needs when stable for discharge home.

## 2017-06-26 LAB — MDIFF
Bands: 2 % (ref 0–15)
Immature Granulocytes Absolute Manual: 0.1 10*3/uL (ref 0.0–0.1)
Myelocytes: 1 %
Number of Cells Counted: 111
Plt Est: DECREASED

## 2017-06-26 LAB — CBC WITH DIFF, BLOOD
ANC-Automated: 3.4 10*3/uL (ref 1.6–7.0)
ANC-Manual Mode: 4.2 10*3/uL (ref 1.6–7.0)
Abs Basophils: 0 10*3/uL (ref <0.1)
Abs Basophils: 0 10*3/uL (ref <0.1)
Abs Eosinophils: 0.1 10*3/uL (ref 0.1–0.5)
Abs Eosinophils: 0 10*3/uL (ref 0.1–0.5)
Abs Lymphs: 0.8 10*3/uL (ref 0.8–3.1)
Abs Lymphs: 0.8 10*3/uL (ref 0.8–3.1)
Abs Monos: 0.1 10*3/uL — ABNORMAL LOW (ref 0.2–0.8)
Abs Monos: 0.3 10*3/uL (ref 0.2–0.8)
Basophils: 0 %
Basophils: 0 %
Eosinophils: 1 %
Eosinophils: 1 %
Hct: 26.1 % — ABNORMAL LOW (ref 34.0–45.0)
Hct: 27.6 % — ABNORMAL LOW (ref 34.0–45.0)
Hgb: 8.4 gm/dL — ABNORMAL LOW (ref 11.2–15.7)
Hgb: 8.9 gm/dL — ABNORMAL LOW (ref 11.2–15.7)
Imm Gran %: 4 % — ABNORMAL HIGH (ref <1)
Imm Gran Abs: 0.2 10*3/uL — ABNORMAL HIGH (ref <0.1)
Lymphocytes: 15 %
Lymphocytes: 16 %
MCH: 25.9 pg — ABNORMAL LOW (ref 26.0–32.0)
MCH: 25.9 pg — ABNORMAL LOW (ref 26.0–32.0)
MCHC: 32.2 g/dL (ref 32.0–36.0)
MCHC: 32.2 g/dL (ref 32.0–36.0)
MCV: 80.6 um3 (ref 79.0–95.0)
MCV: 80.2 um3 (ref 79.0–95.0)
Monocytes: 2 %
Monocytes: 6 %
Plt Count: 34 10*3/uL — ABNORMAL LOW (ref 140–370)
Plt Count: 38 10*3/uL — ABNORMAL LOW (ref 140–370)
RBC: 3.24 10*6/uL — ABNORMAL LOW (ref 3.90–5.20)
RBC: 3.44 10*6/uL — ABNORMAL LOW (ref 3.90–5.20)
RDW: 18.8 % — ABNORMAL HIGH (ref 12.0–14.0)
RDW: 19.3 % — ABNORMAL HIGH (ref 12.0–14.0)
Segs: 79 %
Segs: 73 %
WBC: 5.2 10*3/uL (ref 4.0–10.0)
WBC: 4.7 10*3/uL (ref 4.0–10.0)

## 2017-06-26 LAB — BASIC METABOLIC PANEL, BLOOD
Anion Gap: 17 mmol/L — ABNORMAL HIGH (ref 7–15)
BUN: 129 mg/dL — ABNORMAL HIGH (ref 8–23)
Bicarbonate: 27 mmol/L (ref 22–29)
Calcium: 7.9 mg/dL — ABNORMAL LOW (ref 8.5–10.6)
Chloride: 84 mmol/L — ABNORMAL LOW (ref 98–107)
Creatinine: 2.06 mg/dL — ABNORMAL HIGH (ref 0.51–0.95)
GFR: 24 mL/min
Glucose: 99 mg/dL (ref 70–99)
Potassium: 2.9 mmol/L — ABNORMAL LOW (ref 3.5–5.1)
Sodium: 128 mmol/L — ABNORMAL LOW (ref 136–145)

## 2017-06-26 LAB — MAGNESIUM, BLOOD: Magnesium: 2.5 mg/dL — ABNORMAL HIGH (ref 1.6–2.4)

## 2017-06-26 LAB — PHOSPHORUS, BLOOD: Phosphorous: 5.6 mg/dL — ABNORMAL HIGH (ref 2.7–4.5)

## 2017-06-26 LAB — C.DIFFICILE TOXIN PCR, STOOL: C.Difficile Toxin, PCR: DETECTED — AB

## 2017-06-26 MED ORDER — HYDROCORTISONE 2.5 % RE CREA
1.00 | TOPICAL_CREAM | Freq: Every day | CUTANEOUS | Status: DC
Start: 2017-06-26 — End: 2017-06-27
  Administered 2017-06-26 – 2017-06-27 (×2): 1 via TOPICAL
  Filled 2017-06-26: qty 30

## 2017-06-26 MED ORDER — POTASSIUM CHLORIDE 20 MEQ/50ML IV SOLN
20.00 meq | INTRAVENOUS | Status: AC
Start: 2017-06-26 — End: 2017-06-26
  Administered 2017-06-26 (×2): 20 meq via INTRAVENOUS
  Filled 2017-06-26 (×2): qty 50

## 2017-06-26 MED ORDER — LANSOPRAZOLE 30 MG OR TBDP
30.0000 mg | ORAL_TABLET | Freq: Every day | ORAL | Status: DC
Start: 2017-06-27 — End: 2017-07-15
  Administered 2017-06-27 – 2017-07-11 (×15): 30 mg via ORAL
  Filled 2017-06-26 (×20): qty 1

## 2017-06-26 MED ORDER — MELATONIN 5 MG OR TABS
5.0000 mg | ORAL_TABLET | Freq: Every evening | ORAL | Status: DC
Start: 2017-06-26 — End: 2017-07-15
  Administered 2017-06-26 – 2017-07-14 (×19): 5 mg via ORAL
  Filled 2017-06-26 (×19): qty 1

## 2017-06-26 MED ORDER — SENNA 8.6 MG OR TABS
2.00 | ORAL_TABLET | Freq: Every evening | ORAL | Status: DC
Start: 2017-06-26 — End: 2017-06-26

## 2017-06-26 NOTE — Progress Notes (Addendum)
PULMONARY VASCULAR PROGRESS NOTE  Current Hospital 41 Days 13 Hours     ID: 38 YOF with SLE comp by WHO Group I PAH on triple therapy in setting of anorexogen use who presented on 05/03 for decompensated R heart failure with volume overload, AKI and thrombocytopenia.     Subjective/Interval Events   Afebrile, intermittently tachcardiac, on 3L   Reports belching with acidic taste. Reports pain with defection and possible stool leakagein the setting of known hemorrhoids. Eager for paracentesis. Family reports concern for hearing loss.   Laboratory studies were notable for hyponatremia 128, hypokalemia 2.9, BUN/Cr 129/2.06 (2.07), normocytic anemia 8.4 (8.6), thrombocytopenia 34 (60).    Medications:  Scheduled Meds   docusate sodium  250 mg Daily    epoetin alfa-epbx  4,000 Units Once per day on Fri    furosemide  100 mg 4x Daily    hydrocortisone  1 Application Daily    [START ON 06/27/2017] lansoprazole  30 mg QAM AC    medroxyPROGESTERone  1.25 mg Q24H NR    metolazone  5 mg Daily    polyethylene glycol  17 g Daily    Prostacyclin Cassette Change   Daily    Prostacyclin Tubing   Once per day on Mon Wed Fri    Prostacylin Batteries   Once per day on Mon    sildenafil  80 mg TID    sodium chloride (PF)  3 mL Q8H    spironolactone  75 mg Daily     PRN Meds   acetaminophen  650 mg Q6H PRN    bisacodyl  10 mg Daily PRN    nalOXone  0.1 mg Q2 Min PRN    ondansetron  4 mg Q6H PRN    senna  2 tablet Daily PRN    simethicone  80 mg Q6H PRN    sodium chloride (PF)  10 mL PRN    sodium chloride (PF)  3 mL PRN    sodium chloride   Continuous PRN     IV Meds   DOPamine 3 mcg/kg/min (06/25/17 0531)    epoprostenol (FLOLAN) infusion 35 ng/kg/min (06/25/17 1420)    sodium chloride       Vitals:    Latest Entry  Range (last 24 hours)    Temperature: 98 F (36.7 C)  Temp  Avg: 98.6 F (37 C)  Min: 98.1 F (36.7 C)  Max: 98.8 F (37.1 C)    Blood pressure (BP): 103/53  BP  Min: 109/61  Max: 133/70    Heart Rate: 102  Pulse  Avg: 103.1  Min: 96  Max: 114    Respirations: 20  Resp  Avg: 18.7  Min: 18  Max: 20    SpO2: 98 %  SpO2  Avg: 97.8 %  Min: 95 %  Max: 99 %       No data recorded     Weight: 60.3 kg (132 lb 15 oz)  Percentage Weight Change (%): -0.33 %    V  Intake/Output Summary (Last 24 hours) at 06/19/2017 1113  Last data filed at 06/19/2017 0827  Gross per 24 hour   Intake 968.64 ml   Output 1350 ml   Net -381.36 ml     Exam:   Gen: NAD, comfortable in chair  NECK: + JVD  CV: RRR, + TR no S3  RESP: CTAB, normal wob  ABD: distended + ascites  EXT: 2+ LE edema  NEURO: non-focal, grossly intact  SKIN: Plethoric with  multiple UE ecchymoses    Labs:   CBC  Recent Labs     06/17/17  0611 06/19/17  0608   WBC 3.8* 3.9*   HGB 8.4* 8.1*   HCT 27.5* 26.7*   PLT 59* 73*   SEG 68 65   LYMPHS 23 23   MONOS 3 8      Chemistry  Recent Labs     06/18/17  0606 06/19/17  0608   NA 133* 135*   K 3.6 3.3*   CL 91* 90*   BICARB 24 28   BUN 115* 117*   CREAT 2.51* 2.75*   GLU 87 91   [place] 9.0 9.0   MG 2.1 2.2   PHOS 5.7* 6.4*       ASSESSMENT/PLAN:  67 YOF with SLE comp by WHO Group I PAH on triple therapy in setting of anorexogen use who presented on 05/03 for decompensated R heart failure with volume overload, AKI and thrombocytopenia.    # WHO Group I PAH   # Chronic hypoxemic respiratory failure  # Acute on chronic RV failure   # Hyponatremia, likely hypervolemic  # Hypokalemia   # Thrombocytopenia  - Monitor strict I/O and daily weights  - s/p 4-day course of steroid   - Continue dopamine @2  mcg/kg/min  - Continue diuresis (metolazone 5 mg, IV furosemide 100 mg QID, spironolactone) with plan for IR-guided therapeutic paracentesis on 06/17 (Last on 06/10), pending platelet count  - Continue sildenafil, IV epoprostenol @35  ng/kg/min    # AKI on CKD (baseline Cr 1.3), likely cardiorenal   # Hyperphosphatemia  - Monitor UOP  - Diuresis as above  - Avoiding other nephrotoxic agents  - Nephrology signed off, as not interested in  dialysis    # GERD, on lansoprazole  # Aerophagia, on simethicone PRN  # Hemorrhoids, on hydrocortisone rectal cream     Electrolytes: Monitoring with repletion PRN.   Nutrition: Renal diet with 1.5L fluid restriction  Glycemic control: SSI  Pain control: acetaminophen PRN  Bowels: senna-docusate with miralax.   GI PPx: Lansoprazole   DVT PPx: SCDs. Holding pharmacologic PPx, given thrombocytopenia  Mobility: Per RN    This patient was seen and discussed with my supervising attending physician Dr. [name].    Roseanne Reno, MD  Fellow, Pulmonary and [facility]       Pulmonary Vascular Attending Addendum:  Patient was examined withDr. Angelia Mould, imaging and tests reviewed.  Please see above note for full details and plan.    67 yo F with WHO I PAH associated with SLE/sjogrens, LIPand prior anorexogen use admitted with acute on chronic right heart failure and volume overload as well as AKI and thrombocytopenia.    Wtstable. Examstable but still significant edema.     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    - Lasix 100 mg QID  - Continue metolazone 5 mg  -Dopamine26mcg/kg/min  - Continue Spironolactone  - Continue home sildenafil and flolan at 35 ng/kg/min  - Continue oxygen suppl  - Fluid restrict to 1.5 L/day  -Completed iron sucrose for iron def anemia. Epogen Qweek.    [name] M.D.  Pulmonary and  Critical Care Attending  Pager [phone]

## 2017-06-27 LAB — MAGNESIUM, BLOOD: Magnesium: 2.4 mg/dL (ref 1.6–2.4)

## 2017-06-27 LAB — BASIC METABOLIC PANEL, BLOOD
Anion Gap: 20 mmol/L — ABNORMAL HIGH (ref 7–15)
BUN: 129 mg/dL — ABNORMAL HIGH (ref 8–23)
Bicarbonate: 27 mmol/L (ref 22–29)
Calcium: 8 mg/dL — ABNORMAL LOW (ref 8.5–10.6)
Chloride: 84 mmol/L — ABNORMAL LOW (ref 98–107)
Creatinine: 2.33 mg/dL — ABNORMAL HIGH (ref 0.51–0.95)
GFR: 21 mL/min
Glucose: 100 mg/dL — ABNORMAL HIGH (ref 70–99)
Potassium: 2.8 mmol/L — ABNORMAL LOW (ref 3.5–5.1)
Sodium: 131 mmol/L — ABNORMAL LOW (ref 136–145)

## 2017-06-27 LAB — PHOSPHORUS, BLOOD: Phosphorous: 5.5 mg/dL — ABNORMAL HIGH (ref 2.7–4.5)

## 2017-06-27 MED ORDER — POTASSIUM CHLORIDE 20 MEQ/50ML IV SOLN
20.00 meq | Freq: Once | INTRAVENOUS | Status: AC
Start: 2017-06-27 — End: 2017-06-27
  Administered 2017-06-27: 20 meq via INTRAVENOUS
  Filled 2017-06-27: qty 50

## 2017-06-27 MED ORDER — POTASSIUM CHLORIDE 20 MEQ/15ML (10%) OR SOLN
40.00 meq | Freq: Once | ORAL | Status: AC
Start: 2017-06-27 — End: 2017-06-27
  Administered 2017-06-27: 40 meq via ORAL
  Filled 2017-06-27: qty 30

## 2017-06-27 MED ORDER — VANCOMYCIN 50 MG/ML ORAL SOLN (COMPOUNDED) (~~LOC~~)
125.0000 mg | Freq: Four times a day (QID) | ORAL | Status: DC
Start: 2017-06-27 — End: 2017-07-06
  Administered 2017-06-27 – 2017-07-05 (×35): 125 mg via ORAL
  Filled 2017-06-27 (×35): qty 2.5

## 2017-06-27 MED ORDER — HYDROCORTISONE 2.5 % RE CREA
1.00 | TOPICAL_CREAM | Freq: Four times a day (QID) | CUTANEOUS | Status: DC | PRN
Start: 2017-06-27 — End: 2017-07-15
  Administered 2017-06-28: 1 via TOPICAL
  Filled 2017-06-27: qty 30

## 2017-06-27 NOTE — Progress Notes (Signed)
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     Interval: Diagnosed with Clostridium difficile yesterday, started on PO vancomycin. Still with diarrhea. Brief run of atrial fibrillation with rapid ventricular response, now back in normal sinus rhythm.     Medications:  Scheduled Meds   epoetin alfa-epbx  4,000 Units Once per day on Fri    furosemide  100 mg 4x Daily    hydrocortisone  1 Application Daily    lansoprazole  30 mg QAM AC    medroxyPROGESTERone  1.25 mg Q24H NR    melatonin  5 mg HS    metolazone  5 mg Daily    Prostacyclin Cassette Change   Daily    Prostacyclin Tubing   Once per day on Mon Wed Fri    Prostacylin Batteries   Once per day on Mon    sildenafil  80 mg TID    sodium chloride (PF)  3 mL Q8H    spironolactone  75 mg Daily    vancomycin  125 mg 4x Daily     PRN Meds   acetaminophen  650 mg Q6H PRN    bisacodyl  10 mg Daily PRN    nalOXone  0.1 mg Q2 Min PRN    ondansetron  4 mg Q6H PRN    senna  2 tablet Daily PRN    simethicone  80 mg Q6H PRN    sodium chloride (PF)  10 mL PRN    sodium chloride (PF)  3 mL PRN    sodium chloride   Continuous PRN     IV Meds   DOPamine 3 mcg/kg/min (06/26/17 1925)    epoprostenol (FLOLAN) infusion 35 ng/kg/min (06/26/17 1926)    sodium chloride         Objective:  Temperature:  [96.8 F (36 C)-98.4 F (36.9 C)] 98 F (36.7 C) (06/15 0755)  Blood pressure (BP): (101-119)/(48-74) 103/74 (06/15 0755)  Heart Rate:  [96-114] 96 (06/15 0318)  Respirations:  [18-22] 18 (06/15 0755)  Pain Score: 0 (06/15 0755)  O2 Device: Nasal cannula (06/15 0318)  O2 Flow Rate (L/min):  [3 l/min] 3 l/min (06/15  0318)  SpO2:  [95 %-99 %] 97 % (06/15 0755)  Date Weight Recorded 06/27/2017 06/26/2017 06/25/2017 06/24/2017 06/23/2017 06/22/2017   Metric 59.6 kg 60.3 kg 60.5 kg 60.3 kg 60.6 kg 65.817 kg   Pounds/Ounces 131 lb 6.3 oz 132 lb 15 oz 133 lb 6.1 oz 132 lb 15 oz 133 lb 9.6 oz 145 lb 1.6 oz      Intake/Output Summary (Last 24 hours) at 06/27/2017 1106  Last data filed at 06/27/2017 0423  Gross per 24 hour   Intake 530 ml   Output 1152 ml   Net -622 ml       GEN: NAD, appears comfortable, thrush improving  CV: RRR, no RCG, TR murmur  RESP: CTAB, no wheezes/rales  ABD: +ascites , NT/ND, NABS  EXT: 1+ LE edema  NEURO: non-focal, grossly intact    CBC  Recent Labs     06/26/17  0330 06/26/17  1007   WBC 5.2 4.7   HGB 8.4* 8.9*   HCT 26.1* 27.6*   PLT 34* 38*  BAND 2  --    SEG 79 73   LYMPHS 15 16   MONOS 2 6      Chemistry  Recent Labs     06/26/17  0330 06/27/17  0515   NA 128* 131*   K 2.9* 2.8*   CL 84* 84*   BICARB 27 27   BUN 129* 129*   CREAT 2.06* 2.33*   GLU 99 100*   [place] 7.9* 8.0*   MG 2.5* 2.4   PHOS 5.6* 5.5*     Assessment:   Diana Chambers is a 67 year old female with 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 Now with Clostridium difficile. Has developed pancytopenia.     Plan:  - Continue metolazone 5 mg daily and furosemide 100 mg Q8H  - Continue Spironolactone 75 mg daily  - Replete K+   - Monitor on telemetry for further atrial fibrillation; hold off on starting amiodarone  - Reduce Dopamine to 2 mcg/kg/min to help with diuresis; increased on 6/9 but this may be driving atrial fibrillation   - Paracentesis on Monday if platelets >50k  - PO Vancomycin for Clostridium difficile   - Continue home sildenafil 80 mg TID and Flolan at 35 ng/kg/min  - Check C3, C4, antiDSDNA;  query whether pancytopenia is due to worsening/poorly controlled autoimmune process that may benefit from further immune suppression; no improvement with trial of steroids and she did not like side effects  - Not on any therapy for gout, elevated uric acid; allergic to allopurinol and had rasburicase denied  - Continue supplemental oxygen for SpO2 >92%  - Full Code/Full Care    Kris Hartmann. Rudi Rummage, M.D., M.P.H.  Associate Clinical Professor of Medicine  Division of Pulmonary and Critical Care Medicine  Pager [phone]/PID# [phone]

## 2017-06-27 NOTE — Interdisciplinary (Signed)
MD paged to [place]  Re: Pt Lanuza in rm [phone]. Pt requesting cream for hemorrhoid pls. Need prn order. thanks. Awaiting.

## 2017-06-27 NOTE — Plan of Care (Signed)
Problem: Promotion of Health and Safety  Goal: Promotion of Health and Safety  Description  The patient remains safe, receives appropriate treatment and achieves optimal outcomes (physically, psychosocially, and spiritually) within the limitations of the disease process by discharge.    Information below is the current care plan.  Outcome: Progressing  Flowsheets  Taken 06/26/2017 1930  Patient /Family stated Goal: sleep well  Taken 06/27/2017 0439  Guidelines: Inpatient Nursing Guidelines  Individualized Interventions/Recommendations #1: Monitor I/O's.  Individualized Interventions/Recommendations #2 (if applicable): Monitor for bleeding.  Individualized Interventions/Recommendations #3 (if applicable): Provide emotional support for anxiety.  Individualized Interventions/Recommendations #4 (if applicable): Educate pt on c-diff.  Outcome Evaluation (rationale for progressing/not progressing) every shift: I/O's strictly  monitored. Pt had nose bleedx2 overnight; stopped with pressure and ice. Pt test came back positive for c-diff, education provided and contact precaution placed.

## 2017-06-27 NOTE — Plan of Care (Signed)
Problem: Promotion of Health and Safety  Goal: Promotion of Health and Safety  Description  The patient remains safe, receives appropriate treatment and achieves optimal outcomes (physically, psychosocially, and spiritually) within the limitations of the disease process by discharge.    Information below is the current care plan.  Flowsheets  Taken 06/27/2017 0755 by Jasmine Pang, RN  Patient /Family stated Goal: get some rest  Taken 06/27/2017 1351 by Jasmine Pang, RN  Guidelines: Inpatient Nursing Guidelines  Individualized Interventions/Recommendations #4 (if applicable): use call light when all attempts to get out of bed and chair.  Individualized Interventions/Recommendations #5 (if applicable): continuing to diuresis per md orders.  Taken 06/27/2017 0439 by Berniece Salines, RN  Individualized Interventions/Recommendations #1: Monitor I/O's.  Individualized Interventions/Recommendations #2 (if applicable): Monitor for bleeding.  Individualized Interventions/Recommendations #3 (if applicable): Provide emotional support for anxiety.

## 2017-06-28 LAB — BASIC METABOLIC PANEL, BLOOD
Anion Gap: 19 mmol/L — ABNORMAL HIGH (ref 7–15)
BUN: 127 mg/dL — ABNORMAL HIGH (ref 8–23)
Bicarbonate: 25 mmol/L (ref 22–29)
Calcium: 8.3 mg/dL — ABNORMAL LOW (ref 8.5–10.6)
Chloride: 86 mmol/L — ABNORMAL LOW (ref 98–107)
Creatinine: 2.4 mg/dL — ABNORMAL HIGH (ref 0.51–0.95)
GFR: 20 mL/min
Glucose: 110 mg/dL — ABNORMAL HIGH (ref 70–99)
Potassium: 3 mmol/L — ABNORMAL LOW (ref 3.5–5.1)
Sodium: 130 mmol/L — ABNORMAL LOW (ref 136–145)

## 2017-06-28 LAB — CBC WITH DIFF, BLOOD
ANC-Manual Mode: 4.1 10*3/uL (ref 1.6–7.0)
Abs Basophils: 0.1 10*3/uL (ref <0.1)
Abs Eosinophils: 0 10*3/uL (ref 0.1–0.5)
Abs Lymphs: 0.5 10*3/uL — ABNORMAL LOW (ref 0.8–3.1)
Abs Monos: 0.3 10*3/uL (ref 0.2–0.8)
Basophils: 1 %
Eosinophils: 0 %
Hct: 25.9 % — ABNORMAL LOW (ref 34.0–45.0)
Hgb: 8.2 gm/dL — ABNORMAL LOW (ref 11.2–15.7)
Lymphocytes: 10 %
MCH: 25.7 pg — ABNORMAL LOW (ref 26.0–32.0)
MCHC: 31.7 g/dL — ABNORMAL LOW (ref 32.0–36.0)
MCV: 81.2 um3 (ref 79.0–95.0)
Monocytes: 5 %
Plt Count: 32 10*3/uL — ABNORMAL LOW (ref 140–370)
RBC: 3.19 10*6/uL — ABNORMAL LOW (ref 3.90–5.20)
RDW: 18.6 % — ABNORMAL HIGH (ref 12.0–14.0)
Segs: 82 %
WBC: 5 10*3/uL (ref 4.0–10.0)

## 2017-06-28 LAB — MDIFF
Immature Granulocytes Absolute Manual: 0.1 10*3/uL (ref 0.0–0.1)
Myelocytes: 2 %
Number of Cells Counted: 111
Plt Est: DECREASED

## 2017-06-28 LAB — MAGNESIUM, BLOOD: Magnesium: 2.4 mg/dL (ref 1.6–2.4)

## 2017-06-28 LAB — PHOSPHORUS, BLOOD: Phosphorous: 5.4 mg/dL — ABNORMAL HIGH (ref 2.7–4.5)

## 2017-06-28 LAB — C3, BLOOD: C3: 75 mg/dL — ABNORMAL LOW (ref 90–180)

## 2017-06-28 LAB — C4, BLOOD: C4: 15 mg/dL (ref 10–40)

## 2017-06-28 MED ORDER — POTASSIUM CHLORIDE 20 MEQ/50ML IV SOLN
20.00 meq | INTRAVENOUS | Status: AC
Start: 2017-06-28 — End: 2017-06-28
  Administered 2017-06-28 (×3): 20 meq via INTRAVENOUS
  Filled 2017-06-28 (×2): qty 50

## 2017-06-28 MED ORDER — POTASSIUM CHLORIDE 20 MEQ/15ML (10%) OR SOLN
40.00 meq | Freq: Once | ORAL | Status: AC
Start: 2017-06-28 — End: 2017-06-28
  Administered 2017-06-28: 40 meq via ORAL
  Filled 2017-06-28: qty 30

## 2017-06-28 NOTE — Progress Notes (Signed)
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     Interval: C Diff +, one run of A fib yesterday    Medications:  Scheduled Meds   epoetin alfa-epbx  4,000 Units Once per day on Fri    furosemide  100 mg 4x Daily    lansoprazole  30 mg QAM AC    medroxyPROGESTERone  1.25 mg Q24H NR    melatonin  5 mg HS    metolazone  5 mg Daily    potassium chloride  20 mEq Q1H    Prostacyclin Cassette Change   Daily    Prostacyclin Tubing   Once per day on Mon Wed Fri    Prostacylin Batteries   Once per day on Mon    sildenafil  80 mg TID    sodium chloride (PF)  3 mL Q8H    spironolactone  75 mg Daily    vancomycin  125 mg 4x Daily     PRN Meds   acetaminophen  650 mg Q6H PRN    bisacodyl  10 mg Daily PRN    hydrocortisone  1 Application Z6X PRN    nalOXone  0.1 mg Q2 Min PRN    ondansetron  4 mg Q6H PRN    senna  2 tablet Daily PRN    simethicone  80 mg Q6H PRN    sodium chloride (PF)  10 mL PRN    sodium chloride (PF)  3 mL PRN    sodium chloride   Continuous PRN     IV Meds   DOPamine 2 mcg/kg/min (06/28/17 0549)    epoprostenol (FLOLAN) infusion 35 ng/kg/min (06/28/17 0549)    sodium chloride         Objective:  Temperature:  [97.7 F (36.5 C)-98.3 F (36.8 C)] 97.7 F (36.5 C) (06/16 0734)  Blood pressure (BP): (100-128)/(53-68) 124/53 (06/16 0734)  Heart Rate:  [100-112] 104 (06/16 0744)  Respirations:  [18-22] 22 (06/16 0734)  Pain Score: 0 (06/16 0734)  O2 Device: Nasal cannula (06/16 0734)  O2 Flow Rate (L/min):  [3 l/min] 3 l/min (06/16 0734)  SpO2:  [98 %-100 %] 100 % (06/16 0734)  Date Weight Recorded 06/28/2017 06/27/2017 06/26/2017 06/25/2017 06/24/2017 06/23/2017   Metric 59.5  kg 59.6 kg 60.3 kg 60.5 kg 60.3 kg 60.6 kg   Pounds/Ounces 131 lb 2.8 oz 131 lb 6.3 oz 132 lb 15 oz 133 lb 6.1 oz 132 lb 15 oz 133 lb 9.6 oz      Intake/Output Summary (Last 24 hours) at 06/27/2017 1106  Last data filed at 06/27/2017 0423  Gross per 24 hour   Intake 530 ml   Output 1152 ml   Net -622 ml       GEN: NAD, appears comfortable, thrush improving  CV: RRR, no RCG, TR murmur  RESP: CTAB, no wheezes/rales  ABD: +ascites , NT/ND, NABS  EXT: 1+ LE edema  NEURO: non-focal, grossly intact    CBC  Recent Labs     06/26/17  0330 06/26/17  1007   WBC 5.2 4.7   HGB 8.4* 8.9*   HCT 26.1* 27.6*   PLT 34* 38*   BAND 2  --    SEG 79 73  LYMPHS 15 16   MONOS 2 6      Chemistry  Recent Labs     06/26/17  0330 06/27/17  0515   NA 128* 131*   K 2.9* 2.8*   CL 84* 84*   BICARB 27 27   BUN 129* 129*   CREAT 2.06* 2.33*   GLU 99 100*   [place] 7.9* 8.0*   MG 2.5* 2.4   PHOS 5.6* 5.5*     Assessment:   Diana Chambers is a 67 year old female with 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 Now with Clostridium difficile. Has developed pancytopenia.     Plan:  - Cr 2.06-->2.33-->2.40 , I/O--> negative 270cc/24hr, currently on metolazone 5 mg daily and furosemide 100 mg Q8H,   - Continue Spironolactone 75 mg daily  - Replete K+   - Monitor on telemetry for further atrial fibrillation; hold off on starting amiodarone  - Reduce Dopamine to 2 mcg/kg/min to help with diuresis; increased on 6/9 but this may be driving atrial fibrillation   - Paracentesis on Monday if platelets >50k  - PO Vancomycin for Clostridium difficile   - Continue home sildenafil 80 mg TID and Flolan at 35 ng/kg/min  - Will discuss checking C3, C4, antiDSDNA; query whether pancytopenia is due to worsening/poorly controlled  autoimmune process that may benefit from further immune suppression; no improvement with trial of steroids and she did not like side effects  - Not on any therapy for gout, elevated uric acid; allergic to allopurinol and had rasburicase denied  - Continue supplemental oxygen for SpO2 >92%  - Full Code/Full Care    Doralee Albino, MD  Fellow, Pulmonary Critical Care, [facility]    Pulmonary Vascular Attending Addendum:   Patient was examined with the fellow, Dr. Posey Pronto.  Labs, imaging and tests reviewed.   Please see above note for full details and plan.    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 Now with Clostridium difficile. Has developed pancytopenia. Diarrhea persists. Denies gout pain. Vital signs stable. Heart regular rate and rhythm, no S3, TR murmur, lungs basilar rales, 1+ edema. Labs with low C3, normal C4, antiDSDNA pending. Her basic metabolic panel demonstrates worsening uremia and creatinine up to 2.40. Continue PO vanco. Paracentesis if platelets >50k, consider DDAVP to prevent uremic bleeding. Consider rheumatology consult. Continue current dopamine and PAH medications.     Kris Hartmann. Rudi Rummage, M.D., M.P.H.  Associate Clinical Professor of Medicine  Division of Pulmonary and Critical Care Medicine  Pager [phone]/PID# [phone]

## 2017-06-28 NOTE — Interdisciplinary (Signed)
06/28/17 1631   Follow Up/Progress   Is the Patient Ready for Discharge No   Supplies/Services  Oxygen;IV infusion/antibiotics   Barriers to Discharge Awaiting clinical improvement   Patient/Family/Legal/Surrogate Decision Maker Has Been Given Options And Choice In The Selection of Post-Acute Care Providers Not Applicable   Family/Caregiver's Assessed for Readiness, willingness, and ability to provide or support self-management activities   Respite Care Not Applicable   Patient/Family Are In Agreement With Discharge Plan Yes   Public Health Clearance Needed Not Applicable   Plan/Interventions Explore needs and options for aftercare, provide referrals;Ongoing weekly therapeutic interventions;Confirm return to prior placement     Medical necessity Reason for continued Hospital Stay:  acute on chronic right heart failure and volume overload as well as AKI and thrombocytopenia.Persistent edema.  ?  Interventions requiring continued Hospitalization/Plan of Care:  -Cont lasix IVP diuretic 4x daily   -Cont dopamine gtt  - Continuous supplemental O2 @ 2-3LPM per NC  - Conthomeflolan gtt  ?  Anticipated discharge plan at this time (home, SNF, etc.):  Home  ?  Barriers to Discharge:  None.  ?  Expected DC Date:  TBD.    Per MD notes, plan is for paracentesis on Monday.  Pt also developed C-diff, and is receiving po antibiotic treatment.  Plan to continue treatment with diuretics, until patient is adequately diuresed.  Pt is currently on service with [employer], for Flolan gtt. Pt on service with [employer], for Oxygen DME. CM to confirm service resumption with both companies, upon discharge. Pt to f/u w/ PCP for [facility] RN referral, upon return to [place].  Pt's husband to transport, upon discharge.

## 2017-06-28 NOTE — Plan of Care (Signed)
Problem: Promotion of Health and Safety  Goal: Promotion of Health and Safety  Description  The patient remains safe, receives appropriate treatment and achieves optimal outcomes (physically, psychosocially, and spiritually) within the limitations of the disease process by discharge.    Information below is the current care plan.  Outcome: Progressing  Flowsheets  Taken 06/27/2017 2030 by Gwynne Edinger, RN  Patient /Family stated Goal: to get better  Taken 06/27/2017 1351 by Jasmine Pang, RN  Guidelines: Inpatient Nursing Guidelines  Individualized Interventions/Recommendations #4 (if applicable): use call light when all attempts to get out of bed and chair.  Individualized Interventions/Recommendations #5 (if applicable): continuing to diuresis per md orders.  Taken 06/27/2017 0439 by Berniece Salines, RN  Individualized Interventions/Recommendations #1: Monitor I/O's.  Individualized Interventions/Recommendations #2 (if applicable): Monitor for bleeding.  Individualized Interventions/Recommendations #3 (if applicable): Provide emotional support for anxiety.  Taken 06/28/2017 0035 by Gwynne Edinger, RN  Outcome Evaluation (rationale for progressing/not progressing) every shift: Pt alert and oriented x4. VSS. No c/p. No respiratory distress. Loose stool tonight. See I/O's. Contact Precautions maintained. Pt tearful tonight. Pt feeling tired. Husband arrived tonight but ended up leaving to sleep in his car. Will cont' to monitor and assess. Progress to goal.     Problem: Promotion of Health and Safety  Goal: Promotion of Health and Safety  Description  The patient remains safe, receives appropriate treatment and achieves optimal outcomes (physically, psychosocially, and spiritually) within the limitations of the disease process by discharge.    Information below is the current care plan.  Outcome: Progressing  Flowsheets (Taken 06/28/2017 0035)  Outcome Evaluation (rationale for progressing/not progressing)  every shift: Pt alert and oriented x4. VSS. No c/p. No respiratory distress. Loose stool tonight. See I/O's. Contact Precautions maintained. Pt tearful tonight. Pt feeling tired. Husband arrived tonight but ended up leaving to sleep in his car. Will cont' to monitor and assess. Progress to goal.

## 2017-06-28 NOTE — Plan of Care (Signed)
Problem: Promotion of Health and Safety  Goal: Promotion of Health and Safety  Description  The patient remains safe, receives appropriate treatment and achieves optimal outcomes (physically, psychosocially, and spiritually) within the limitations of the disease process by discharge.    Information below is the current care plan.  Flowsheets  Taken 06/28/2017 0734 by Grayling Congress, RN  Patient /Family stated Goal: feel better  Taken 06/28/2017 1555 by Grayling Congress, RN  Guidelines: Inpatient Nursing Guidelines  Individualized Interventions/Recommendations #1: Group tasks so that patient has periods of rest today.  Individualized Interventions/Recommendations #2 (if applicable): Encourage patient to express emotions and voice concerns.  Outcome Evaluation (rationale for progressing/not progressing) every shift: Patient able to have short rest periods today. Husband at bedside on and off throughout shift today and supportive. Reminded to call for assistance before attempting to get OOB or chair.  Taken 06/27/2017 1351 by Jasmine Pang, RN  Individualized Interventions/Recommendations #4 (if applicable): use call light when all attempts to get out of bed and chair.

## 2017-06-29 LAB — CBC WITH DIFF, BLOOD
ANC-Automated: 3.2 10*3/uL (ref 1.6–7.0)
Abs Basophils: 0 10*3/uL (ref <0.1)
Abs Eosinophils: 0 10*3/uL (ref 0.1–0.5)
Abs Lymphs: 0.9 10*3/uL (ref 0.8–3.1)
Abs Monos: 0.3 10*3/uL (ref 0.2–0.8)
Basophils: 0 %
Eosinophils: 0 %
Hct: 24.8 % — ABNORMAL LOW (ref 34.0–45.0)
Hgb: 8.1 gm/dL — ABNORMAL LOW (ref 11.2–15.7)
Imm Gran %: 5 % — ABNORMAL HIGH (ref <1)
Imm Gran Abs: 0.3 10*3/uL — ABNORMAL HIGH (ref <0.1)
Lymphocytes: 20 %
MCH: 26 pg (ref 26.0–32.0)
MCHC: 32.7 g/dL (ref 32.0–36.0)
MCV: 79.5 um3 (ref 79.0–95.0)
Monocytes: 6 %
Plt Count: 35 10*3/uL — ABNORMAL LOW (ref 140–370)
RBC: 3.12 10*6/uL — ABNORMAL LOW (ref 3.90–5.20)
RDW: 18.8 % — ABNORMAL HIGH (ref 12.0–14.0)
Segs: 68 %
WBC: 4.6 10*3/uL (ref 4.0–10.0)

## 2017-06-29 LAB — BASIC METABOLIC PANEL, BLOOD
Anion Gap: 17 mmol/L — ABNORMAL HIGH (ref 7–15)
Anion Gap: 15 mmol/L (ref 7–15)
Anion Gap: 16 mmol/L — ABNORMAL HIGH (ref 7–15)
BUN: 124 mg/dL — ABNORMAL HIGH (ref 8–23)
BUN: 121 mg/dL — ABNORMAL HIGH (ref 8–23)
BUN: 121 mg/dL — ABNORMAL HIGH (ref 8–23)
Bicarbonate: 24 mmol/L (ref 22–29)
Bicarbonate: 24 mmol/L (ref 22–29)
Bicarbonate: 23 mmol/L (ref 22–29)
Calcium: 8.2 mg/dL — ABNORMAL LOW (ref 8.5–10.6)
Calcium: 8.2 mg/dL — ABNORMAL LOW (ref 8.5–10.6)
Calcium: 8.3 mg/dL — ABNORMAL LOW (ref 8.5–10.6)
Chloride: 86 mmol/L — ABNORMAL LOW (ref 98–107)
Chloride: 89 mmol/L — ABNORMAL LOW (ref 98–107)
Chloride: 88 mmol/L — ABNORMAL LOW (ref 98–107)
Creatinine: 2.42 mg/dL — ABNORMAL HIGH (ref 0.51–0.95)
Creatinine: 1.91 mg/dL — ABNORMAL HIGH (ref 0.51–0.95)
Creatinine: 2.23 mg/dL — ABNORMAL HIGH (ref 0.51–0.95)
GFR: 20 mL/min
GFR: 26 mL/min
GFR: 22 mL/min
Glucose: 126 mg/dL — ABNORMAL HIGH (ref 70–99)
Glucose: 146 mg/dL — ABNORMAL HIGH (ref 70–99)
Glucose: 131 mg/dL — ABNORMAL HIGH (ref 70–99)
Potassium: 2.7 mmol/L — CL (ref 3.5–5.1)
Potassium: 3.6 mmol/L (ref 3.5–5.1)
Potassium: 3 mmol/L — ABNORMAL LOW (ref 3.5–5.1)
Sodium: 127 mmol/L — ABNORMAL LOW (ref 136–145)
Sodium: 128 mmol/L — ABNORMAL LOW (ref 136–145)
Sodium: 127 mmol/L — ABNORMAL LOW (ref 136–145)

## 2017-06-29 LAB — MAGNESIUM, BLOOD
Magnesium: 2.4 mg/dL (ref 1.6–2.4)
Magnesium: 2.4 mg/dL (ref 1.6–2.4)
Magnesium: 2.4 mg/dL (ref 1.6–2.4)

## 2017-06-29 LAB — PHOSPHORUS, BLOOD
Phosphorous: 5.1 mg/dL — ABNORMAL HIGH (ref 2.7–4.5)
Phosphorous: 4.9 mg/dL — ABNORMAL HIGH (ref 2.7–4.5)
Phosphorous: 4.8 mg/dL — ABNORMAL HIGH (ref 2.7–4.5)

## 2017-06-29 MED ORDER — POTASSIUM CHLORIDE 20 MEQ/15ML (10%) OR SOLN
40.00 meq | Freq: Once | ORAL | Status: AC
Start: 2017-06-29 — End: 2017-06-29
  Administered 2017-06-29: 40 meq via ORAL
  Filled 2017-06-29: qty 30

## 2017-06-29 MED ORDER — POTASSIUM CHLORIDE 20 MEQ/50ML IV SOLN
20.00 meq | INTRAVENOUS | Status: AC
Start: 2017-06-29 — End: 2017-06-29
  Administered 2017-06-29 (×3): 20 meq via INTRAVENOUS
  Filled 2017-06-29 (×3): qty 50

## 2017-06-29 MED ORDER — POTASSIUM CHLORIDE 10 MEQ/100ML IV SOLN
10.00 meq | INTRAVENOUS | Status: DC
Start: 2017-06-29 — End: 2017-06-29

## 2017-06-29 MED ORDER — POTASSIUM CHLORIDE 20 MEQ/50ML IV SOLN
20.00 meq | INTRAVENOUS | Status: AC
Start: 2017-06-30 — End: 2017-06-30
  Administered 2017-06-30 (×3): 20 meq via INTRAVENOUS
  Filled 2017-06-29 (×2): qty 50

## 2017-06-29 MED ORDER — SPIRONOLACTONE 25 MG OR TABS
75.00 mg | ORAL_TABLET | Freq: Two times a day (BID) | ORAL | Status: DC
Start: 2017-06-29 — End: 2017-07-15
  Administered 2017-06-29 – 2017-07-15 (×33): 75 mg via ORAL
  Filled 2017-06-29 (×32): qty 3

## 2017-06-29 NOTE — Interdisciplinary (Signed)
MD paged to [place]  Re: Pt [place] in rm [phone]. Lab: K 2.7, need order pls. Thanks. Awaiting.

## 2017-06-29 NOTE — Interdisciplinary (Signed)
See spiritual care intervention flow sheet for detailed info about the visit.

## 2017-06-29 NOTE — Plan of Care (Signed)
Problem: Promotion of Health and Safety  Goal: Promotion of Health and Safety  Description  The patient remains safe, receives appropriate treatment and achieves optimal outcomes (physically, psychosocially, and spiritually) within the limitations of the disease process by discharge.    Information below is the current care plan.  Flowsheets  Taken 06/29/2017 2000 by Carlynn Spry, RN  Patient /Family stated Goal: "stop having BM"  Taken 06/28/2017 1555 by Grayling Congress, RN  Individualized Interventions/Recommendations #1: Group tasks so that patient has periods of rest today.  Individualized Interventions/Recommendations #2 (if applicable): Encourage patient to express emotions and voice concerns.  Taken 06/29/2017 2004 by Carlynn Spry, RN  Individualized Interventions/Recommendations #3 (if applicable): cont oral antibiotic for Cdiff.  Outcome Evaluation (rationale for progressing/not progressing) every shift: pt cont to have LBM.cont oral Vancomycin as odered. cot to diureses. cont strict I & O. pt is flat affect,enc to verbalized concerns. palliative care was consulted.  Taken 06/27/2017 1351 by Jasmine Pang, RN  Individualized Interventions/Recommendations #4 (if applicable): use call light when all attempts to get out of bed and chair.  Individualized Interventions/Recommendations #5 (if applicable): continuing to diuresis per md orders.

## 2017-06-29 NOTE — Progress Notes (Signed)
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     Subjective:  Patient reports that her diarrhea is a little better. She has a little more warning now between episodes. She denies chest pain, fevers, or shortness of breath. She sleeps on an incline. She has chills frequently. She denies nausea or vomiting.     ROS reviewed and unchanged    I/O:  Intake/Output       06/28/17 0600 - 06/29/17 0559 06/29/17 0600 - 06/30/17 0559      [id number] [id number] Total 0600-1759 0737-1062 Total       Intake    P.O.  350  -- 350  --  -- --    I.V.  100  55.2 155.2  --  -- --    Total Intake 450 55.2 505.2 -- -- --       Output    Urine  500  475 975  --  -- --    Total Output 500 475 975 -- -- --       Net I/O     -50 -419.8 -469.8 -- -- --          PE:  Temperature:  [97.8 F (36.6 C)-98.6 F (37 C)] 97.8 F (36.6 C) (06/17 0800)  Blood pressure (BP): (106-122)/(53-69) 122/63 (06/17 0800)  Heart Rate:  [97-109] 98 (06/17 0800)  Respirations:  [18-20] 18 (06/17 0800)  Pain Score: 0 (06/17 0800)  O2 Device: Nasal cannula (06/17 0800)  O2 Flow Rate (L/min):  [3 l/min] 3 l/min (06/17 0800)  SpO2:  [97 %-100 %] 99 % (06/17 0800)  Physical Exam   Constitutional: She is oriented to person, place, and time. No distress.   Thin frail female   HENT:   Diana Chambers: Normocephalic and atraumatic.   Eyes: Right eye exhibits no discharge. Left eye exhibits no discharge.   Neck: Normal range of motion. Neck supple.   Cardiovascular:   Murmur heard.  Tachycardic, irregular   Pulmonary/Chest: Effort normal. No respiratory distress. She has no wheezes. She has rales (bibasilar).   Abdominal: Soft. She exhibits distension. There is  no tenderness.   Musculoskeletal: She exhibits edema (Right > Left lower extremity edema, +2).   Neurological: She is alert and oriented to person, place, and time.   Skin: Skin is warm and dry. She is not diaphoretic.   Psychiatric: Affect normal.       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 Now with Clostridium difficile. Has developed pancytopenia.     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 Diff colitis - continue po vanc  -diarrhea slowly improving    4) Hypokalemia - replete aggressively, will repeat BMP q8    5) Hypoxemic respiratory failure - continue oxygen, diurese as able    6) afib - continue low dose dopamine  -rate controlled currently    Patient seen and discussed with attending, Dr. Georgiann Chambers.    Diana Gosselin, MD, MPH  Pulmonary/Critical Care Fellow  Pager # [phone]      Pulmonary Vascular Attending Addendum:  Patient was examined withDr. Stark Chambers,  imaging and tests reviewed.  Please see above note for full details and plan.    67 yo F with WHO I PAH associated with SLE/sjogrens, LIPand prior anorexogen use admitted with acute on chronic right heart failure and volume overload as well as AKI and thrombocytopenia. IR paracentesis on 6/10 5.5 L removed.    Persistent edema, ascites.  + C diff infection    -Repeat therapeutic para when plt > 50 k.  - Begin plans to arrange home dopamine @ 2 mcg/kg/min  - Will need tunneled line switched to double lumen    - Lasix 100 mg QID  - Continue metolazone 5 mg  - Additional dose of diuril  -Dopamine74mcg/kg/min  - Increase spironolactone and replete K  - Continue home sildenafil and flolan at 35 ng/kg/min  - Continue PO vanco (day 3) for  cdiff  - Continue oxygen suppl  - Fluid restrict to 1.5 L/day  -Completed iron sucrose for iron def anemia. Epogen Qweek.    Diana Diana Chambers M.D.  Pulmonary and Critical Care Attending  Pager [phone]

## 2017-06-29 NOTE — Plan of Care (Signed)
Problem: Promotion of Health and Safety  Goal: Promotion of Health and Safety  Description  The patient remains safe, receives appropriate treatment and achieves optimal outcomes (physically, psychosocially, and spiritually) within the limitations of the disease process by discharge.    Information below is the current care plan.  Outcome: Progressing  Flowsheets  Taken 06/28/2017 2036 by Gwynne Edinger, RN  Patient /Family stated Goal: to feel better  Taken 06/28/2017 1555 by Grayling Congress, RN  Guidelines: Inpatient Nursing Guidelines  Individualized Interventions/Recommendations #1: Group tasks so that patient has periods of rest today.  Individualized Interventions/Recommendations #2 (if applicable): Encourage patient to express emotions and voice concerns.  Taken 06/27/2017 0439 by Berniece Salines, RN  Individualized Interventions/Recommendations #3 (if applicable): Provide emotional support for anxiety.  Taken 06/27/2017 1351 by Jasmine Pang, RN  Individualized Interventions/Recommendations #4 (if applicable): use call light when all attempts to get out of bed and chair.  Individualized Interventions/Recommendations #5 (if applicable): continuing to diuresis per md orders.  Taken 06/29/2017 0014 by Gwynne Edinger, RN  Outcome Evaluation (rationale for progressing/not progressing) every shift: Pt a/ox4. VSS. Pt denies pain. Cont' w/ loose stool. Contact precautions maintained. Husband at bedside.Will cont' to monitor and assess. Progress to goal.     Problem: Promotion of Health and Safety  Goal: Promotion of Health and Safety  Description  The patient remains safe, receives appropriate treatment and achieves optimal outcomes (physically, psychosocially, and spiritually) within the limitations of the disease process by discharge.    Information below is the current care plan.  Outcome: Progressing

## 2017-06-29 NOTE — Plan of Care (Signed)
Problem: Promotion of Health and Safety  Goal: Promotion of Health and Safety  Description  The patient remains safe, receives appropriate treatment and achieves optimal outcomes (physically, psychosocially, and spiritually) within the limitations of the disease process by discharge.    Information below is the current care plan.  Outcome: Progressing  Flowsheets  Taken 06/29/2017 0842 by Grayling Congress, RN  Patient /Family stated Goal: get better and go homoe  Taken 06/28/2017 1555 by Grayling Congress, RN  Guidelines: Inpatient Nursing Guidelines  Individualized Interventions/Recommendations #1: Group tasks so that patient has periods of rest today.  Individualized Interventions/Recommendations #2 (if applicable): Encourage patient to express emotions and voice concerns.  Taken 06/27/2017 0439 by Berniece Salines, RN  Individualized Interventions/Recommendations #3 (if applicable): Provide emotional support for anxiety.  Taken 06/29/2017 1411 by Grayling Congress, RN  Outcome Evaluation (rationale for progressing/not progressing) every shift: Patient able to rest on and off today, Husband has been at bedside and has been supportive. Patient and spouse asking about more information regarding palliative care/hospice this morning.

## 2017-06-30 LAB — BASIC METABOLIC PANEL, BLOOD
Anion Gap: 21 mmol/L — ABNORMAL HIGH (ref 7–15)
Anion Gap: 16 mmol/L — ABNORMAL HIGH (ref 7–15)
BUN: 121 mg/dL — ABNORMAL HIGH (ref 8–23)
BUN: 123 mg/dL — ABNORMAL HIGH (ref 8–23)
Bicarbonate: 19 mmol/L — ABNORMAL LOW (ref 22–29)
Bicarbonate: 22 mmol/L (ref 22–29)
Calcium: 8.4 mg/dL — ABNORMAL LOW (ref 8.5–10.6)
Calcium: 8.5 mg/dL (ref 8.5–10.6)
Chloride: 90 mmol/L — ABNORMAL LOW (ref 98–107)
Chloride: 91 mmol/L — ABNORMAL LOW (ref 98–107)
Creatinine: 2.18 mg/dL — ABNORMAL HIGH (ref 0.51–0.95)
Creatinine: 2.2 mg/dL — ABNORMAL HIGH (ref 0.51–0.95)
GFR: 22 mL/min
GFR: 22 mL/min
Glucose: 97 mg/dL (ref 70–99)
Glucose: 135 mg/dL — ABNORMAL HIGH (ref 70–99)
Potassium: 3.6 mmol/L (ref 3.5–5.1)
Potassium: 3.6 mmol/L (ref 3.5–5.1)
Sodium: 130 mmol/L — ABNORMAL LOW (ref 136–145)
Sodium: 129 mmol/L — ABNORMAL LOW (ref 136–145)

## 2017-06-30 LAB — ANTI-DSDNA, BLOOD: Anti-DSDNA: 176 IU — ABNORMAL HIGH (ref 0–24)

## 2017-06-30 LAB — MAGNESIUM, BLOOD
Magnesium: 2.3 mg/dL (ref 1.6–2.4)
Magnesium: 2.3 mg/dL (ref 1.6–2.4)

## 2017-06-30 LAB — PHOSPHORUS, BLOOD
Phosphorous: 4.8 mg/dL — ABNORMAL HIGH (ref 2.7–4.5)
Phosphorous: 4.7 mg/dL — ABNORMAL HIGH (ref 2.7–4.5)

## 2017-06-30 MED ORDER — POTASSIUM CHLORIDE CRYS CR 20 MEQ OR TBCR
20.00 meq | EXTENDED_RELEASE_TABLET | Freq: Once | ORAL | Status: AC
Start: 2017-06-30 — End: 2017-06-30
  Administered 2017-06-30 (×2): 20 meq via ORAL
  Filled 2017-06-30: qty 1

## 2017-06-30 MED ORDER — POTASSIUM CHLORIDE 20 MEQ/50ML IV SOLN
20.00 meq | INTRAVENOUS | Status: AC
Start: 2017-06-30 — End: 2017-07-01
  Administered 2017-06-30 – 2017-07-01 (×4): 20 meq via INTRAVENOUS
  Filled 2017-06-30: qty 50

## 2017-06-30 MED ORDER — POTASSIUM CHLORIDE 20 MEQ/50ML IV SOLN
20.00 meq | Freq: Once | INTRAVENOUS | Status: AC
Start: 2017-06-30 — End: 2017-06-30
  Administered 2017-06-30: 20 meq via INTRAVENOUS
  Filled 2017-06-30: qty 50

## 2017-06-30 MED ORDER — POTASSIUM CHLORIDE 20 MEQ/50ML IV SOLN
INTRAVENOUS | Status: DC
Start: 2017-06-30 — End: 2017-07-01
  Filled 2017-06-30: qty 50

## 2017-06-30 NOTE — Progress Notes (Signed)
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     Subjective:  Diarrhea improved a little since yesterday but patient unable to sleep much last night. She denies abdominal pain, nausea, vomiting, fevers, chills, chest pain or worsened shortness of breath.    ROS reviewed and unchanged    I/O:  Intake/Output       06/29/17 0600 - 06/30/17 0559 06/30/17 0600 - 07/01/17 0559      [id number] 4332-9518 Total 0600-1759 8416-6063 Total       Intake    P.O.  480  100 580  240  -- 240    I.V.  50  103 153  --  -- --    Total Intake 530 203 733 240 -- 240       Output    Urine  850  840 1690  450  -- 450    Stool  --  -- --  1  -- 1    Total Output [phone] 451 -- 451       Net I/O     -320 -637 -957 -211 -- -211          PE:  Temperature:  [98 F (36.7 C)-98.7 F (37.1 C)] 98.1 F (36.7 C) (06/18 1133)  Blood pressure (BP): (109-125)/(59-68) 109/66 (06/18 1133)  Heart Rate:  [94-105] 100 (06/18 1133)  Respirations:  [17-19] 18 (06/18 1133)  Pain Score: 3 (06/18 1133)  O2 Device: Nasal cannula (06/18 0717)  O2 Flow Rate (L/min):  [3 l/min] 3 l/min (06/18 0717)  SpO2:  [99 %-100 %] 99 % (06/18 1133)  Physical Exam   Constitutional: No distress.   HENT:   Head: Normocephalic and atraumatic.   Eyes: Right eye exhibits no discharge. Left eye exhibits no discharge.   Neck: Normal range of motion. Neck supple.   Cardiovascular:   Murmur heard.  Tachycardic   Pulmonary/Chest: Effort normal. No respiratory distress. She has no wheezes. She has rales.   Abdominal: Soft. She exhibits distension. There is no tenderness.   Musculoskeletal: She exhibits edema (Left > right +2 lower extremity edema).      Neurological: She is alert.   Skin: Skin is dry. She is not diaphoretic.       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 Now with Clostridium difficile. Has developed pancytopenia.     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 Diff colitis - continue po vanc (day 4)  -diarrhea slowly improving    4) Hypokalemia - replete aggressively, will repeat BMP q12    5) Hypoxemic respiratory failure - continue oxygen, diurese as able    6) afib - continue low dose dopamine  -rate controlled currently    7) Thrombocytopenia - may be related to c. Diff vs autoimmune hemolysis  -will trend for now    8) CKD - likely in part secondary to heart disease  -improving some with diuresis, suggesting cardiorenal    9) Hyponatremia - likely due to heart failure/volume overload    Patient seen and discussed with attending, Dr. [name].    [name], MD, MPH  Pulmonary/Critical  Care Fellow  Pager # [phone]      Pulmonary Vascular Attending Addendum:  Patient was examined withDr.Barry  Labs, imaging and tests reviewed.  Please see above note for full details and plan.    67 yo F with WHO I PAH associated with SLE/sjogrens, LIPand prior anorexogen use admitted with acute on chronic right heart failure and volume overload as well as AKI and thrombocytopenia. IR paracentesis on 6/10 5.5 L removed.    Persistent edema, ascites.  + C diff infection    -Repeat therapeutic para when plt > 50 k.  - Begin plans to arrange home dopamine @ 2 mcg/kg/min  - Will need tunneled line switched to double lumen  - Lasix 100 mg QID  -Continuemetolazone 5 mg  -Dopamine81mcg/kg/min  - Spironolactone and replete K  - Continue home sildenafil and flolan at 35 ng/kg/min  - Continue PO vanco (day 4) for  cdiff  - Continue oxygen suppl  - Fluid restrict to 1.5 L/day  -Completed iron sucrose for iron def anemia. Epogen Qweek.  - discussed goals of care at length with patient and husband yesterday and today.  While they understand that her condition is deteriorating and this is likely terminal progression of her disease they are not yet ready for hospice or palliative care services.    [name] M.D.  Pulmonary and Critical Care Attending  Pager [phone]

## 2017-06-30 NOTE — Plan of Care (Signed)
Problem: Promotion of Health and Safety  Goal: Promotion of Health and Safety  Description  The patient remains safe, receives appropriate treatment and achieves optimal outcomes (physically, psychosocially, and spiritually) within the limitations of the disease process by discharge.    Information below is the current care plan.  Outcome: Progressing  Flowsheets  Taken 06/30/2017 0400 by Madlyn Frankel, RN  Patient /Family stated Goal: no diarrhea so i could sleep tonight and pee more  Guidelines: Inpatient Nursing Guidelines  Individualized Interventions/Recommendations #1: Diuretics as ordered(see MAR)  Individualized Interventions/Recommendations #2 (if applicable): pt with foley cath for accurate urine output  Individualized Interventions/Recommendations #4 (if applicable): encouarged pt to call when having BM  Individualized Interventions/Recommendations #5 (if applicable): vancomycin as ordered for C-diff  Outcome Evaluation (rationale for progressing/not progressing) every shift: Pt reported only 1 BM during shift. Pt voiding through foley with moderate amount of urine(see I&O's). Pt able to sleep most of the night.  Taken 06/29/2017 2004 by Carlynn Spry, RN  Individualized Interventions/Recommendations #3 (if applicable): cont oral antibiotic for Cdiff.

## 2017-06-30 NOTE — Plan of Care (Signed)
Problem: Promotion of Health and Safety  Goal: Promotion of Health and Safety  Description  The patient remains safe, receives appropriate treatment and achieves optimal outcomes (physically, psychosocially, and spiritually) within the limitations of the disease process by discharge.    Information below is the current care plan.  Outcome: Progressing  Flowsheets  Taken 06/30/2017 0717 by Jasmine Pang, RN  Patient /Family stated Goal: stop the c.diff  Taken 06/30/2017 1838 by Jasmine Pang, RN  Guidelines: Inpatient Nursing Guidelines  Taken 06/30/2017 0400 by Madlyn Frankel, RN  Individualized Interventions/Recommendations #2 (if applicable): pt with foley cath for accurate urine output  Individualized Interventions/Recommendations #4 (if applicable): encouarged pt to call when having BM  Taken 06/29/2017 2004 by Carlynn Spry, RN  Individualized Interventions/Recommendations #3 (if applicable): cont oral antibiotic for Cdiff.  Note:   pt is on aggressive diuretics and tolerating well. replace potassium with redraw in the evening. pt stil having multiple BM's with sbc in use.

## 2017-07-01 LAB — BASIC METABOLIC PANEL, BLOOD
Anion Gap: 16 mmol/L — ABNORMAL HIGH (ref 7–15)
Anion Gap: 14 mmol/L (ref 7–15)
BUN: 120 mg/dL — ABNORMAL HIGH (ref 8–23)
BUN: 113 mg/dL — ABNORMAL HIGH (ref 8–23)
Bicarbonate: 24 mmol/L (ref 22–29)
Bicarbonate: 23 mmol/L (ref 22–29)
Calcium: 8.4 mg/dL — ABNORMAL LOW (ref 8.5–10.6)
Calcium: 8.6 mg/dL (ref 8.5–10.6)
Chloride: 92 mmol/L — ABNORMAL LOW (ref 98–107)
Chloride: 93 mmol/L — ABNORMAL LOW (ref 98–107)
Creatinine: 2.17 mg/dL — ABNORMAL HIGH (ref 0.51–0.95)
Creatinine: 1.52 mg/dL — ABNORMAL HIGH (ref 0.51–0.95)
GFR: 23 mL/min
GFR: 34 mL/min
Glucose: 105 mg/dL — ABNORMAL HIGH (ref 70–99)
Glucose: 137 mg/dL — ABNORMAL HIGH (ref 70–99)
Potassium: 3.4 mmol/L — ABNORMAL LOW (ref 3.5–5.1)
Potassium: 4 mmol/L (ref 3.5–5.1)
Sodium: 132 mmol/L — ABNORMAL LOW (ref 136–145)
Sodium: 130 mmol/L — ABNORMAL LOW (ref 136–145)

## 2017-07-01 LAB — CBC WITH DIFF, BLOOD
ANC-Automated: 2.7 10*3/uL (ref 1.6–7.0)
Abs Basophils: 0 10*3/uL (ref <0.1)
Abs Eosinophils: 0 10*3/uL (ref 0.1–0.5)
Abs Lymphs: 0.9 10*3/uL (ref 0.8–3.1)
Abs Monos: 0.3 10*3/uL (ref 0.2–0.8)
Basophils: 0 %
Eosinophils: 0 %
Hct: 23.9 % — ABNORMAL LOW (ref 34.0–45.0)
Hgb: 7.8 gm/dL — ABNORMAL LOW (ref 11.2–15.7)
Imm Gran %: 5 % — ABNORMAL HIGH (ref <1)
Imm Gran Abs: 0.2 10*3/uL — ABNORMAL HIGH (ref <0.1)
Lymphocytes: 21 %
MCH: 26.1 pg (ref 26.0–32.0)
MCHC: 32.6 g/dL (ref 32.0–36.0)
MCV: 79.9 um3 (ref 79.0–95.0)
Monocytes: 6 %
Plt Count: 36 10*3/uL — ABNORMAL LOW (ref 140–370)
RBC: 2.99 10*6/uL — ABNORMAL LOW (ref 3.90–5.20)
RDW: 18.8 % — ABNORMAL HIGH (ref 12.0–14.0)
Segs: 67 %
WBC: 4 10*3/uL (ref 4.0–10.0)

## 2017-07-01 LAB — PHOSPHORUS, BLOOD
Phosphorous: 4.6 mg/dL — ABNORMAL HIGH (ref 2.7–4.5)
Phosphorous: 4.5 mg/dL (ref 2.7–4.5)

## 2017-07-01 LAB — MAGNESIUM, BLOOD
Magnesium: 2.2 mg/dL (ref 1.6–2.4)
Magnesium: 2.2 mg/dL (ref 1.6–2.4)

## 2017-07-01 MED ORDER — SACCHAROMYCES BOULARDII 250 MG OR CAPS
250.00 mg | ORAL_CAPSULE | Freq: Two times a day (BID) | ORAL | Status: DC
Start: 2017-07-01 — End: 2017-07-15
  Administered 2017-07-01 – 2017-07-15 (×28): 250 mg via ORAL
  Filled 2017-07-01 (×28): qty 1

## 2017-07-01 MED ORDER — NEPHROCAPS 1 MG OR CAPS
1.00 | ORAL_CAPSULE | Freq: Every day | ORAL | Status: DC
Start: 2017-07-02 — End: 2017-07-15
  Administered 2017-07-02 – 2017-07-15 (×14): 1 via ORAL
  Filled 2017-07-01 (×14): qty 1

## 2017-07-01 MED ORDER — VITAMIN B-1 100 MG OR TABS
100.00 mg | ORAL_TABLET | Freq: Every day | ORAL | Status: DC
Start: 2017-07-02 — End: 2017-07-15
  Administered 2017-07-02 – 2017-07-15 (×14): 100 mg via ORAL
  Filled 2017-07-01 (×14): qty 1

## 2017-07-01 MED ORDER — POTASSIUM CHLORIDE 20 MEQ/50ML IV SOLN
20.00 meq | INTRAVENOUS | Status: AC
Start: 2017-07-01 — End: 2017-07-01
  Administered 2017-07-01 (×4): 20 meq via INTRAVENOUS
  Filled 2017-07-01 (×4): qty 50

## 2017-07-01 NOTE — Plan of Care (Signed)
Problem: Promotion of Health and Safety  Goal: Promotion of Health and Safety  Description  The patient remains safe, receives appropriate treatment and achieves optimal outcomes (physically, psychosocially, and spiritually) within the limitations of the disease process by discharge.    Information below is the current care plan.  Outcome: Progressing  Flowsheets  Taken 07/01/2017 0324 by Darrold Junker, RN  Guidelines: Inpatient Nursing Guidelines  Individualized Interventions/Recommendations #1: Continue with lasix IVP, monitor accurate I/O, daily wt.  Individualized Interventions/Recommendations #3 (if applicable): Continue on flolan drip as ordered.  Individualized Interventions/Recommendations #4 (if applicable): Continue oral ABT for C diff/  Outcome Evaluation (rationale for progressing/not progressing) every shift: Pt alert/o x 4, denies pain, tele SR/ST, no acute resp distress noted. Good peri care after each bm.Pt continue on Dobutamine drip and flolan drip.  Taken 06/30/2017 0400 by Madlyn Frankel, RN  Individualized Interventions/Recommendations #2 (if applicable): pt with foley cath for accurate urine output  Individualized Interventions/Recommendations #5 (if applicable): vancomycin as ordered for C-diff  Goal: Promotion of Health and Safety  Description  The patient remains safe, receives appropriate treatment and achieves optimal outcomes (physically, psychosocially, and spiritually) within the limitations of the disease process by discharge.    Information below is the current care plan.  Outcome: Progressing     Problem: Promotion of Health and Safety  Goal: Promotion of Health and Safety  Description  The patient remains safe, receives appropriate treatment and achieves optimal outcomes (physically, psychosocially, and spiritually) within the limitations of the disease process by discharge.    Information below is the current care plan.  Outcome: Progressing

## 2017-07-01 NOTE — Interdisciplinary (Signed)
See spiritual care intervention flow sheet for detailed info about the visit.

## 2017-07-01 NOTE — Progress Notes (Signed)
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    Subjective:  Patient continues to have some loose stools though she feels it's getting a little better. She denies chest pain, fevers, chills, nausea, vomiting, lightheadedness or dizziness.     ROS reviewed and unchanged    I/O:  Intake/Output       06/30/17 0600 - 07/01/17 0559 07/01/17 0600 - 07/02/17 0559      3810-1751 0258-5277 Total 0600-1759 8242-3536 Total       Intake    P.O.  1050  120 1170  1200  -- 1200    I.V.  --  -- --  301.6  -- 301.6    Total Intake 1050 120 1170 1501.6 -- 1501.6       Output    Urine  850  1050 1900  1600  -- 1600    Stool  3  -- 3  --  -- --    Total Output 853 1050 1903 1600 -- 1600       Net I/O     197 -930 -733 -98.5 -- -98.5          PE:  Temperature:  [98 F (36.7 C)-98.6 F (37 C)] 98.4 F (36.9 C) (06/19 1612)  Blood pressure (BP): (108-131)/(56-75) 131/75 (06/19 1759)  Heart Rate:  [94-111] 104 (06/19 1759)  Respirations:  [18-20] 20 (06/19 1612)  Pain Score: 2 (06/19 1624)  O2 Device: Nasal cannula (06/19 1612)  O2 Flow Rate (L/min):  [2 l/min-3 l/min] 3 l/min (06/19 1612)  SpO2:  [97 %-100 %] 100 % (06/19 1612)  Physical Exam   Constitutional: No distress.   HENT:   Head: Normocephalic and atraumatic.   Eyes: Right eye exhibits no discharge. Left eye exhibits no discharge.   Neck: Normal range of motion. JVD (v-wave) present.   Cardiovascular: Normal rate.   Murmur heard.  Irregular rhythm   Pulmonary/Chest: Effort normal. No respiratory distress. She has no wheezes. She has rales (bilaterally).   Abdominal: Soft. She exhibits distension. There is no tenderness.   Musculoskeletal: She exhibits  edema (right greater than left edema, +1-2).   Neurological: She is alert.   Skin: Skin is warm and dry. She is not diaphoretic.       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 Now with Clostridium difficile. Has developed pancytopenia.    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 Diff colitis - continue po vanc (day 5)  -diarrhea slowly improving    4) Hypokalemia - replete aggressively, will repeat BMP q12    5) Hypoxemic respiratory failure - continue oxygen, diurese as able    6) afib - continue low dose dopamine  -rate controlled currently    7) Thrombocytopenia - may be related to c. Diff vs autoimmune hemolysis  -will trend for now, trying to avoid platelets for para and line if possible    8) CKD - likely in part secondary to heart  disease  -improving some with diuresis, suggesting cardiorenal    9) Hyponatremia - likely due to heart failure/volume overload    Patient seen and discussed with attending, Dr. [name].    [name], MD, MPH  Pulmonary/Critical Care Fellow  Pager # [phone]    Pulmonary Vascular Attending Addendum:  Patient was examined withDr.Barry  Labs, imaging and tests reviewed.  Please see above note for full details and plan.    67 yo F with WHO I PAH associated with SLE/sjogrens, LIPand prior anorexogen use admitted with acute on chronic right heart failure and volume overload as well as AKI and thrombocytopenia.IRparacentesis on6/10 5.5 Lremoved.    Persistent edema, ascites. + C diff infection    -Repeat therapeutic parawhen plt > 50 k.  - Begin plans to arrange home dopamine @ 2 mcg/kg/min  - Will need tunneled line switched to double lumen  - Lasix 100 mg QID  -Continuemetolazone 5 mg  -Dopamine77mcg/kg/min  -Spironolactoneand replete K  - Continue home sildenafil and flolan at 35 ng/kg/min  - Continue PO vanco(day 5) for cdiff  - Continue oxygen suppl  - Fluid restrict to 1.5 L/day  -Completed iron sucrose for iron def anemia. Epogen Qweek.  - discussed goals of care at length with patient and husband. While they understand that her condition is deteriorating and this is likely terminal progression of her disease they are not yet ready for hospice or palliative care services.    [name] M.D.  Pulmonary and

## 2017-07-01 NOTE — Interdisciplinary (Signed)
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    Past Medical History:   Diagnosis Date    Pulmonary HTN (CMS-HCC)     Sjoegren syndrome (CMS-HCC) 07/12/2009      No past surgical history on file.      HT: 5\' 2"   WT: Will use current lowest wt of 129lbs (58.9kg) via standing scale 07/01/17 for nutrition calculations below.    Wt Readings from Last 20 Encounters:   07/01/17 58.9 kg (129 lb 13.6 oz)   01/21/17 58.9 kg (129 lb 14.4 oz)   10/15/16 67.1 kg (148 lb)   09/10/16 67.1 kg (148 lb)   08/22/16 66 kg (145 lb 8 oz)   08/13/16 63.2 kg (139 lb 5.3 oz)   08/13/16 63 kg (139 lb)   07/28/16 64.2 kg (141 lb 8.6 oz)   06/21/16 64 kg (141 lb 1.5 oz)   05/16/16 74.4 kg (164 lb 0.4 oz)   02/26/16 73.9 kg (162 lb 14.4 oz)   12/12/15 73.5 kg (162 lb)   06/20/15 73.5 kg (162 lb)   02/12/15 73.4 kg (161 lb 12.8 oz)   12/21/14 76.3 kg (168 lb 5 oz)   09/27/14 75 kg (165 lb 6.4 oz)   04/12/14 73 kg (161 lb)   11/02/13 73.4 kg (161 lb 14.4 oz)   03/23/13 71.2 kg (157 lb)   02/22/13 70.4 kg (155 lb 3.3 oz)     IBW: 110lbs  %IBW: 117%  UBW: 135-139lbs. Pt reported wt falsely elevated at time of admit though d/t fluid accumulation.  BMI: Body mass index is 23.75 kg/m.  Nutrition hx:   At time of DTR IS 05/18/17 pt reported UBW 135-139lbs. Estimated dry weight ~129lbs per nephrology notes (possibly lower though).     Note pt w/ paracentesis performed 6/10 w/ 5.5L removed. Likely contributing to declined wt hx since admit. Per EMR data though pt w/ down trending wt hx.  Previously weighing as much as 164lbs on 05/16/16; indicating total mixed fluid/true wt loss of approx. 35lbs (21% body wt) loss x ~13 months. Wt loss significant.      Diet Rx: Diet Therapeutic; Renal Restricted; Renal 60g Protein/ 2g Na/ 2g K+/ 1g Phos; 1571ml Volume Restriction  Nutritional Supplement Nepro or Novasource Renal; Cafe Mocha (Ne); Deliver Supplements: BID Prefers mocha ONLY. Thank you    PO Intake: Pt has remained on renal 60gm pro diet since admit with good PO intakes. Pt often consuming 3 meals daily and eating 75-100% of meals. Pt enjoys Novasource Public librarian only). Discussed CKD and diet recommendations at time of visit. Encouraged pt to refer to CKD Stage 5 Nutrition Therapy handout from nutrition care manual provided by DTR in the past. Pt asking about probiotic use also for C. Diff.   Tray Items Taken for the past 168 hrs:   Number of Items Taken Number of Items on Tray Diet Tolerance   06/24/17 1400 3 -- --   06/24/17 1822 2 -- Tolerates   06/25/17 0900 3 3 Tolerates   06/25/17 1200 3 3 Tolerates   06/25/17 1836 2 2 Tolerates   06/26/17 1000 3 3 Tolerates   06/26/17 1300 2 2 Tolerates   06/27/17 0755 2 3 Tolerates  06/27/17 1244 0 1 Declines meal   06/27/17 1800 3 3 Tolerates   06/28/17 0930 3 4 Tolerates   06/28/17 1220 3 3 Tolerates   06/28/17 1700 2 4 Tolerates   06/29/17 0851 4 4 Tolerates   06/29/17 1221 3 3 Tolerates   06/29/17 1755 4 4 Tolerates   06/30/17 0900 4 4 Tolerates   06/30/17 1200 3 4 Tolerates   06/30/17 1707 3 4 Tolerates   06/30/17 1755 3 3 Tolerates   07/01/17 0900 2.5 4 Tolerates      Imaging: Reviewed.    GI: LBM today x4 per I/Os. +C. diff 06/26/17.   Skin: Laceration on L toe and blister on L upper chest noted  Edema: +2 pitting RL/LL extremities  Nutrition-Focused Physical Exam: (07/01/17) - large ascites evident.       Subcutaneous fat loss: Orbital (Mild), Triceps/biceps (Severe), Thoracic/lumbar (moderate).        Muscle loss: Temple (moderate), Clavicle (moderate), Acromion process (moderate), Scapula (moderate), Interosseous (Severe), Patellar (Severe/Moderate), Quadriceps (Severe/Moderate), Calves (UTA d/t edema).         Potential micronutrient deficiencies: Multiple bruises/skin discoloring noted on arms - pt reported d/t pancytopenia. Hair appeared thin/brittle.     I/O 06/18 0600 - 06/19 0559  In: 1170 [P.O.:1170]  Out: 1903 [Urine:1900] Net since admit: -13,954ml    Hydration status/ Maintenance fluids: Deferred to MD d/t lasix/renal fx    Labs: Reviewed, GFR 23  Recent Labs     06/30/17  0600 06/30/17  1803 07/01/17  0530   NA 130* 129* 132*   K 3.6 3.6 3.4*   BUN 121* 123* 120*   CREAT 2.18* 2.20* 2.17*   GLU 97 135* 105*   [place] 8.4* 8.5 8.4*   MG 2.3 2.3 2.2   PHOS 4.8* 4.7* 4.6*     No results for input(s): GLUCPOCT in the last 72 hours.    Pertinent MEDS:Reviewed, Lasix, Prevacid, Melatonin, KCL, Aldactone, Vancocin, Intropin drip, Flolan drip    Estimated Nutrition Needs per Mifflin equation (58.9kg): 1079kcal x 1.4 - 1.7 = 1511 - 1834kcal/day (26 - 31kcal/kg). 47 - 59g/day protein (0.8 - 1.0g/kg - monitor renal fx) Maintenance fluids: Deferred to MD       Nutrition Dx: Severe malnutrition r/t chronic illness AEB severe/moderate muscle/fat wasting noted per NFPE, severe fluid accumulations and hx of significant wt loss in the past year d/t a mix of fluid and true wt loss from medical complications.    I: GOAL: (6/19) meet >80% of estimated needs w/ acceptable tolerance    - First call paged w/ nutrition recommendations listed below and to consider starting probiotics if no contraindications noted.    Plan / Recommendations:   - REC adjust diet to renal 60gm pro, 2gmNa, 1g phos w/ NO potassium restriction given frequent need for potassium replacements. Volume Restriction per MD.     - Continue Novasource Renal (Cafe Mocha) BID for increased kcal/pro intake    - REC daily nephrocaps and 100mg  thiamine d/t severe malnutrition dx.    - REC check albumin and Vit D (25-OH) labs given steroid use, poor renal function and minimal fat/muscle stores.    - Pt requesting probiotics for diarrhea w/ C. Diff. Consider starting  florastor if no contraindications. Recommended dose 250mg  BID x 10 days.    - Consider phos binders w/ meals if persistent hyperphosphatemia.    - Trend weight daily.    Discharge: pending clinical course   Education: Discussed CKD  diet recommendations and increasing fiber intake at this time given renal fx/C. Diff.   RD/DTR to monitor/evaluate: labs, wt trend, and po/nutrition support status, and S/S of new skin concerns.   Will continue to follow patient per approved [place] Nutrition Prioritization Schedule Guidelines. Nutrition Services remains available via "IP Nutrition Consult" should patient medical status change; Recommendations relayed to care team.     Baruch Merl, [place], [place]

## 2017-07-01 NOTE — Interdisciplinary (Signed)
07/01/17 1343   Follow Up/Progress   Is the Patient Ready for Discharge No   Supplies/Services  Oxygen;IV infusion/antibiotics   Barriers to Discharge Awaiting clinical improvement   Patient/Family/Legal/Surrogate Decision Maker Has Been Given Options And Choice In The Selection of Post-Acute Care Providers Yes   Family/Caregiver's Assessed for Readiness, willingness, and ability to provide or support self-management activities   Respite Care Not Applicable   Patient/Family Are In Agreement With Discharge Plan Yes   Public Health Clearance Needed Not Applicable   Plan/Interventions Explore needs and options for aftercare, provide referrals;Ongoing weekly therapeutic interventions;Confirm return to prior placement     Medical necessity Reason for continued Hospital Stay:  acute on chronic right heart failure and volume overload as well as AKI and thrombocytopenia.Persistent edema.  ?  Interventions requiring continued Hospitalization/Plan of Care:  -Cont lasix IVP diuretic4x daily  -Cont dopamine gtt  - Continuous supplemental O2 @2 -3LPMper NC  - Conthomeflolan gtt  ?  Anticipated discharge plan at this time (home, SNF, etc.):  Home  ?  Barriers to Discharge:  None.  ?  Expected DC Date:  TBD.    Plan to continue treatment with diuretics, until patient is adequately diuresed.Pt is currently on service with [employer], for Flolan gtt. Pt on service with [employer], for Oxygen DME. CM to confirm service resumption with both companies, upon discharge.Referrals initiated for home inotrope infusion, upon discharge.  CM to follow up on response. Pt's husband to transport, upon discharge.

## 2017-07-01 NOTE — Plan of Care (Signed)
Problem: Promotion of Health and Safety  Goal: Promotion of Health and Safety  Description  The patient remains safe, receives appropriate treatment and achieves optimal outcomes (physically, psychosocially, and spiritually) within the limitations of the disease process by discharge.    Information below is the current care plan.  Outcome: Not Progressing  Flowsheets  Taken 07/01/2017 0841  Patient /Family stated Goal: get better  Taken 07/01/2017 1633  Guidelines: Inpatient Nursing Guidelines  Individualized Interventions/Recommendations #1: administer Lasix, Dopamine and Flolan as ordered and documented-see MAR for details. Monitor labs and replace electrolytes as ordered.  Individualized Interventions/Recommendations #2 (if applicable): Educate patient importance of fluid restriction as ordered, monitor urine output via foley catheter.  Individualized Interventions/Recommendations #3 (if applicable): continue oral Vancomycin as ordered for cdiff, continue isolation  Outcome Evaluation (rationale for progressing/not progressing) every shift: Patient being diuresed with high doses of Lasix, output as recorded. Reinforce instruction on fluid restriction, per patient MD states restriction on water only. Educated patient, husband and son present at bedside recommend fluid restriction to be all liquids since patient on such high doses of Lasix and unable to titrate dose down, reinforce education needed. Potassium replaced as ordered, see MAR for details. Patient/family concerns that diarrhea worsening, requesting imodium and probiotics. Dr. Alvester Chou text paged as noted for requests on probiotics, patient/family educated on imodium not recommended when cdiff infection active.  Note:   Observation and Significant Events       Date/Time Observations Significant Events    07/01/17 0906  pulm fellow rounding-Dr. Alvester Chou at bedside.   --

## 2017-07-02 DIAGNOSIS — A0472 Enterocolitis due to Clostridium difficile, not specified as recurrent: Secondary | ICD-10-CM

## 2017-07-02 LAB — CBC WITH DIFF, BLOOD
ANC-Automated: 2.2 10*3/uL (ref 1.6–7.0)
Abs Basophils: 0 10*3/uL (ref <0.1)
Abs Eosinophils: 0 10*3/uL (ref 0.1–0.5)
Abs Lymphs: 0.9 10*3/uL (ref 0.8–3.1)
Abs Monos: 0.2 10*3/uL (ref 0.2–0.8)
Basophils: 0 %
Eosinophils: 1 %
Hct: 23.8 % — ABNORMAL LOW (ref 34.0–45.0)
Hgb: 7.7 gm/dL — ABNORMAL LOW (ref 11.2–15.7)
Imm Gran %: 4 % — ABNORMAL HIGH (ref <1)
Imm Gran Abs: 0.2 10*3/uL — ABNORMAL HIGH (ref <0.1)
Lymphocytes: 25 %
MCH: 26.4 pg (ref 26.0–32.0)
MCHC: 32.4 g/dL (ref 32.0–36.0)
MCV: 81.5 um3 (ref 79.0–95.0)
Monocytes: 6 %
Plt Count: 35 10*3/uL — ABNORMAL LOW (ref 140–370)
RBC: 2.92 10*6/uL — ABNORMAL LOW (ref 3.90–5.20)
RDW: 18.6 % — ABNORMAL HIGH (ref 12.0–14.0)
Segs: 63 %
WBC: 3.5 10*3/uL — ABNORMAL LOW (ref 4.0–10.0)

## 2017-07-02 LAB — BASIC METABOLIC PANEL, BLOOD
Anion Gap: 15 mmol/L (ref 7–15)
Anion Gap: 15 mmol/L (ref 7–15)
BUN: 118 mg/dL — ABNORMAL HIGH (ref 8–23)
BUN: 116 mg/dL — ABNORMAL HIGH (ref 8–23)
Bicarbonate: 24 mmol/L (ref 22–29)
Bicarbonate: 23 mmol/L (ref 22–29)
Calcium: 8.5 mg/dL (ref 8.5–10.6)
Calcium: 8.7 mg/dL (ref 8.5–10.6)
Chloride: 93 mmol/L — ABNORMAL LOW (ref 98–107)
Chloride: 93 mmol/L — ABNORMAL LOW (ref 98–107)
Creatinine: 1.76 mg/dL — ABNORMAL HIGH (ref 0.51–0.95)
Creatinine: 2.15 mg/dL — ABNORMAL HIGH (ref 0.51–0.95)
GFR: 29 mL/min
GFR: 23 mL/min
Glucose: 101 mg/dL — ABNORMAL HIGH (ref 70–99)
Glucose: 120 mg/dL — ABNORMAL HIGH (ref 70–99)
Potassium: 3.4 mmol/L — ABNORMAL LOW (ref 3.5–5.1)
Potassium: 4.4 mmol/L (ref 3.5–5.1)
Sodium: 132 mmol/L — ABNORMAL LOW (ref 136–145)
Sodium: 131 mmol/L — ABNORMAL LOW (ref 136–145)

## 2017-07-02 LAB — MAGNESIUM, BLOOD
Magnesium: 2.2 mg/dL (ref 1.6–2.4)
Magnesium: 2.1 mg/dL (ref 1.6–2.4)

## 2017-07-02 LAB — PHOSPHORUS, BLOOD
Phosphorous: 5 mg/dL — ABNORMAL HIGH (ref 2.7–4.5)
Phosphorous: 4.1 mg/dL (ref 2.7–4.5)

## 2017-07-02 LAB — ALBUMIN, BLOOD: Albumin: 3.3 g/dL — ABNORMAL LOW (ref 3.5–5.2)

## 2017-07-02 MED ORDER — POTASSIUM CHLORIDE 20 MEQ/50ML IV SOLN
20.00 meq | INTRAVENOUS | Status: AC
Start: 2017-07-02 — End: 2017-07-02
  Administered 2017-07-02 (×4): 20 meq via INTRAVENOUS
  Filled 2017-07-02 (×3): qty 50

## 2017-07-02 NOTE — Plan of Care (Signed)
Problem: Promotion of Health and Safety  Goal: Promotion of Health and Safety  Description  The patient remains safe, receives appropriate treatment and achieves optimal outcomes (physically, psychosocially, and spiritually) within the limitations of the disease process by discharge.    Information below is the current care plan.  Outcome: Progressing  Flowsheets  Taken 07/02/2017 0400 by Darrold Junker, RN  Guidelines: Inpatient Nursing Guidelines  Outcome Evaluation (rationale for progressing/not progressing) every shift: Pt alert/o x 4, tele SR/ST, continue to diurese with lasix, monitored accurate I/O, daily wt and fluid restriction. Pt still with diarrhea brown stool. Continue on Vanco po for cdiff and isolation.  Taken 07/01/2017 1633 by Petrizzo, Georgia Duff, RN  Individualized Interventions/Recommendations #1: administer Lasix, Dopamine and Flolan as ordered and documented-see MAR for details. Monitor labs and replace electrolytes as ordered.  Individualized Interventions/Recommendations #2 (if applicable): Educate patient importance of fluid restriction as ordered, monitor urine output via foley catheter.  Individualized Interventions/Recommendations #3 (if applicable): continue oral Vancomycin as ordered for cdiff, continue isolation  Taken 07/01/2017 0324 by Darrold Junker, RN  Individualized Interventions/Recommendations #4 (if applicable): Continue oral ABT for C diff/

## 2017-07-02 NOTE — Plan of Care (Signed)
Problem: Promotion of Health and Safety  Goal: Promotion of Health and Safety  Description  The patient remains safe, receives appropriate treatment and achieves optimal outcomes (physically, psychosocially, and spiritually) within the limitations of the disease process by discharge.    Information below is the current care plan.  Outcome: Progressing  Flowsheets  Taken 07/02/2017 0730 by Vinie Sill, RN  Patient /Family stated Goal: Feel better  Taken 07/02/2017 1643 by Vinie Sill, RN  Individualized Interventions/Recommendations #1: Monitor for pt's tolerance while on dopamine gtt and flolan infusion; ensure flolan is infusing through dedicated line and line remains patent/well secured  Individualized Interventions/Recommendations #4 (if applicable): Encourage pt to increase PO intake and supplement witn protein supplements as tolerated  Outcome Evaluation (rationale for progressing/not progressing) every shift: Pt tolerating dopamin and flolan infusions, VSS.  Pt down with weight and continues to diurese adequate amounts.  Pt has had multiple loose stools but starting to becoming more formed.  Safety maintained this shift.  Taken 07/01/2017 1633 by Petrizzo, Georgia Duff, RN  Individualized Interventions/Recommendations #2 (if applicable): Educate patient importance of fluid restriction as ordered, monitor urine output via foley catheter.  Individualized Interventions/Recommendations #3 (if applicable): continue oral Vancomycin as ordered for cdiff, continue isolation

## 2017-07-02 NOTE — Progress Notes (Addendum)
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    Subjective:  The patient reports that she slept well and feels better today. She feels her diarrhea is improving still. She denies chest pain, shortness of breath, fevers, chills, nausea or vomiting. She feels her appetite is improving, as is her leg swelling.     ROS reviewed and unchanged    I/O:  Intake/Output       07/01/17 0600 - 07/02/17 0559 07/02/17 0600 - 07/03/17 0559      1017-5102 [id number] Total 0600-1759 4235-3614 Total       Intake    P.O.  1200  140 1340  337  700 1037    I.V.  301.6  36 337.6  301.2  -- 301.2    Total Intake 1501.6 176 1677.6 638.2 700 1338.2       Output    Urine  1600  950 2550  1400  -- 1400    Total Output 1600 950 2550 1400 -- 1400       Net I/O     -98.5 -774 -872.5 -761.8 700 -61.8        Chest tubes  Pacers    PE:  Temperature:  [97.6 F (36.4 C)-98.3 F (36.8 C)] 98.3 F (36.8 C) (06/20 1651)  Blood pressure (BP): (110-119)/(56-74) 114/63 (06/20 1651)  Heart Rate:  [95-102] 102 (06/20 1651)  Respirations:  [16-18] 18 (06/20 1651)  Pain Score: 0 (06/20 1651)  O2 Device: Nasal cannula (06/20 1651)  O2 Flow Rate (L/min):  [3 l/min] 3 l/min (06/20 1651)  SpO2:  [97 %-98 %] 98 % (06/20 1651)  Physical Exam   Constitutional: No distress.   HENT:   Head: Normocephalic and atraumatic.   Eyes: Right eye exhibits no discharge. Left eye exhibits no discharge.   Neck: Normal range of motion. Neck supple.   Cardiovascular: Normal rate.   Murmur heard.  Irregular rhythm   Pulmonary/Chest: Effort normal. No respiratory distress. She has no wheezes. She has rales.   Abdominal: Soft. She exhibits distension. There is  no tenderness.   Musculoskeletal: She exhibits edema (right greater than left lower extremity edema; 1-2 + on right).   Neurological: She is alert.   Skin: Skin is warm and dry. She is not diaphoretic.       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 Now with Clostridium difficile. Has developed pancytopenia.    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  -currently not on immunosuppression   -continue lasix 100mg  4x/day and metolazone 5mg  daily until diarrhea improving  -diuresis and lower extremity edema improving  -continue aggressive potassium replacement  -patient's spouse became agitated over possible platelet transfusion tomorrow for PICC line and paracentesis; needs platelets >50K for procedure, will hold plan's for procedure at this time  -continue flolan, sildenafil and dopamine  -will start process of arranging home dopamine    2) SLE/Sjogren's with LIP - currently not on immunosuppression   -has been seen by rheum in the past  -anti-DS DNA > 300 in the past, currently 176    3) C. Diff colitis - continue po vanc(day 6)  -diarrhea slowly improving    4) Hypokalemia - replete aggressively, will repeat BMP q12    5) Hypoxemic respiratory failure - continue  oxygen, diurese as able    6) afib - continue low dose dopamine  -rate controlled currently    7) Thrombocytopenia - may be related to c. Diff vs autoimmune hemolysis vs primary heme process (myeloproliferative neoplasm)  -will trend for now, likely will need platelets for procedure but family became aggressive with this plan    8) CKD - likely in part secondary to heart disease  -improving some with diuresis, suggesting cardiorenal  -fluctuates frequently    9) Hyponatremia - likely due to heart failure/volume overload    Patient seen and discussed with attending, Dr. Victory Dakin.    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     I have reviewed the history.  Interval history:  Continues on IV Dopa and Epo, as well as around the clock diuretic. Per reports her abdomen has increased in size and become more firm since the beginning of the week, although pt and husband do not seem to necessarily agree with this. PLTs steadily at 35k. Requires change of tunneled cath to double lumen for eventual d/c on both Dopa and Epo. Overall thinks she is better, but having a hard time to verbalize how. Less edema still with abdominal distention, stable DOE, possibly less fatigue. Making urine. Still with D, albeit improving in the setting of C Dif.      We discussed the likelihood of doing Para for her ascites and then changing the tunneled cath. We also discussed the need for PLTs prior to any procedure. The pt is worried aabout PLTs making her PAH worse, and being told to not receive transfusions in the past. I explained the situation and the reasoning behind it. I also explained that PLTs per se would not woren her PAH, and it is a matter of volume, which we are  already taking off and it would be a small amount of volume compared to other blood products. The husband joined the conversation and became belligerent, menacing and disrespectful. After a long conversation we decided to not proceed with any PLTs transfusions or any procedures, continue with the same plan and  await for Dr.Poch's return on 6/24 prior to any other related decisions.      ROS  No SOB at rest, + chronic DOE, no CP, syncope, or edema.+ D, no N, V.  A complete ROS was negative except as noted above.    Objective  A&O, RRR with accentuated s2 and +M, bibasilar crackles, NT ++D with ascites and relatively tense, +2 BLEx edema R>L. Very cachectic upper body, with extensive ecchymoses, and ascites with edema. R sided tunneled cath site is CDI with healing blood blister medial to it.  I have examined the patient and concur with the fellow exam.    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   1. Acute on chronic RV CHF:  - Continue Lasix  - Lyte replacement  - Consider Para in the future  - PAH Rx  - Dopa gtt continued and will need home infusion  2. PAH:  - Continue I Epo and PDE5i  - Diuresis as above  - Dopaas above  3. C Dif:  - D improving  - Vanco PO  4. Thrombocytopenia:   - Follow  - No Tf unless <10k or bleeding or needing to get procedure done  5. CKD:  - Likely cardiorenal  - Follow UO, BUN/Cr  6. Chronic hypoxemic resp failure:  - O2  7. Lyte abnormalities:  - Replace as needed      I agree with the fellow care plan.  See the fellow note for further details.    Francesca Jewett, MD  Pulmonary and Critical Care  PID [id number] / Pager [phone]

## 2017-07-03 LAB — BASIC METABOLIC PANEL, BLOOD
Anion Gap: 16 mmol/L — ABNORMAL HIGH (ref 7–15)
Anion Gap: 17 mmol/L — ABNORMAL HIGH (ref 7–15)
BUN: 118 mg/dL — ABNORMAL HIGH (ref 8–23)
BUN: 120 mg/dL — ABNORMAL HIGH (ref 8–23)
Bicarbonate: 22 mmol/L (ref 22–29)
Bicarbonate: 23 mmol/L (ref 22–29)
Calcium: 8.5 mg/dL (ref 8.5–10.6)
Calcium: 8.7 mg/dL (ref 8.5–10.6)
Chloride: 93 mmol/L — ABNORMAL LOW (ref 98–107)
Chloride: 91 mmol/L — ABNORMAL LOW (ref 98–107)
Creatinine: 1.92 mg/dL — ABNORMAL HIGH (ref 0.51–0.95)
Creatinine: 2.18 mg/dL — ABNORMAL HIGH (ref 0.51–0.95)
GFR: 26 mL/min
GFR: 22 mL/min
Glucose: 94 mg/dL (ref 70–99)
Glucose: 101 mg/dL — ABNORMAL HIGH (ref 70–99)
Potassium: 3.6 mmol/L (ref 3.5–5.1)
Potassium: 3.6 mmol/L (ref 3.5–5.1)
Sodium: 131 mmol/L — ABNORMAL LOW (ref 136–145)
Sodium: 131 mmol/L — ABNORMAL LOW (ref 136–145)

## 2017-07-03 LAB — CBC WITH DIFF, BLOOD
ANC-Automated: 2.4 10*3/uL (ref 1.6–7.0)
Abs Basophils: 0 10*3/uL (ref <0.1)
Abs Eosinophils: 0 10*3/uL (ref 0.1–0.5)
Abs Lymphs: 0.9 10*3/uL (ref 0.8–3.1)
Abs Monos: 0.3 10*3/uL (ref 0.2–0.8)
Basophils: 0 %
Eosinophils: 0 %
Hct: 24.6 % — ABNORMAL LOW (ref 34.0–45.0)
Hgb: 7.8 gm/dL — ABNORMAL LOW (ref 11.2–15.7)
Imm Gran %: 3 % — ABNORMAL HIGH (ref <1)
Imm Gran Abs: 0.1 10*3/uL (ref <0.1)
Lymphocytes: 24 %
MCH: 25.8 pg — ABNORMAL LOW (ref 26.0–32.0)
MCHC: 31.7 g/dL — ABNORMAL LOW (ref 32.0–36.0)
MCV: 81.5 um3 (ref 79.0–95.0)
Monocytes: 7 %
Plt Count: 34 10*3/uL — ABNORMAL LOW (ref 140–370)
RBC: 3.02 10*6/uL — ABNORMAL LOW (ref 3.90–5.20)
RDW: 19 % — ABNORMAL HIGH (ref 12.0–14.0)
Segs: 65 %
WBC: 3.7 10*3/uL — ABNORMAL LOW (ref 4.0–10.0)

## 2017-07-03 LAB — RBC MORPHOLOGY: Plt Est: DECREASED

## 2017-07-03 LAB — PHOSPHORUS, BLOOD
Phosphorous: 4.3 mg/dL (ref 2.7–4.5)
Phosphorous: 4.1 mg/dL (ref 2.7–4.5)

## 2017-07-03 LAB — MAGNESIUM, BLOOD
Magnesium: 2.1 mg/dL (ref 1.6–2.4)
Magnesium: 2.1 mg/dL (ref 1.6–2.4)

## 2017-07-03 MED ORDER — POTASSIUM CHLORIDE 20 MEQ/50ML IV SOLN
20.00 meq | INTRAVENOUS | Status: AC
Start: 2017-07-03 — End: 2017-07-03
  Administered 2017-07-03 (×2): 20 meq via INTRAVENOUS
  Filled 2017-07-03 (×2): qty 50

## 2017-07-03 NOTE — Plan of Care (Signed)
Problem: Promotion of Health and Safety  Goal: Promotion of Health and Safety  Description  The patient remains safe, receives appropriate treatment and achieves optimal outcomes (physically, psychosocially, and spiritually) within the limitations of the disease process by discharge.    Information below is the current care plan.  Outcome: Progressing  Flowsheets  Taken 07/02/2017 2031 by Jacquelynn Cree, Nechama Guard, RN  Patient /Family stated Goal: bowel movement gets better  Taken 07/03/2017 0436 by Madlyn Frankel, RN  Guidelines: Inpatient Nursing Guidelines  Individualized Interventions/Recommendations #1: assessed and monitor bowel movement  Individualized Interventions/Recommendations #2 (if applicable): encouraged pt to use bedside commode to easily assess stool  Individualized Interventions/Recommendations #4 (if applicable): maitained isolation precaution for c-diff  Outcome Evaluation (rationale for progressing/not progressing) every shift: pt with atleast 2 small loose BM during shift. pt states that she has less loose BM than previous days.  Taken 07/01/2017 1633 by Petrizzo, Georgia Duff, RN  Individualized Interventions/Recommendations #3 (if applicable): continue oral Vancomycin as ordered for cdiff, continue isolation

## 2017-07-03 NOTE — Plan of Care (Signed)
Problem: Promotion of Health and Safety  Goal: Promotion of Health and Safety  Description  The patient remains safe, receives appropriate treatment and achieves optimal outcomes (physically, psychosocially, and spiritually) within the limitations of the disease process by discharge.    Information below is the current care plan.  Outcome: Progressing  Flowsheets  Taken 07/03/2017 0730 by Vinie Sill, RN  Patient /Family stated Goal: Get better  Taken 07/03/2017 0436 by Madlyn Frankel, RN  Guidelines: Inpatient Nursing Guidelines  Taken 07/03/2017 1622 by Vinie Sill, RN  Individualized Interventions/Recommendations #1: Continue oral vanco for treatment of CDIFF and maintain isolation precautions  Individualized Interventions/Recommendations #2 (if applicable): Maintain strict I/O's and reinforce importance of fluid restriction while pt being diuresed  Individualized Interventions/Recommendations #3 (if applicable): Monitor pt's tolerance while on dopamin and flolan infusions; ensure pt has dedicated line and backup cassette at bedside  Individualized Interventions/Recommendations #4 (if applicable): Monitor pt's BM's while in treatment for CDIFF  Outcome Evaluation (rationale for progressing/not progressing) every shift: Pt continues to diurese appropriately with irmpoved edema to BLE.  Pt still with +ascites .  Pt having less BMs today and more formed.  Vitals remains stable while on flolan/dopamine gtt.  Pt in good spirits today and family supportive at bedside.  Safety maintained this shift.

## 2017-07-03 NOTE — Interdisciplinary (Signed)
See spiritual care intervention flow sheet for detailed info about the visit.

## 2017-07-03 NOTE — Progress Notes (Signed)
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     Overnight events: Continues on IV Dopa and Epo, as well as around the clock diuretic. Made a lot of urine yesterday and a little less today. Improving edema. Still with a lot of ascites. Feels that D is also slowing down and stools more formed. Had some HA and Picc line site pain that resolved with tylenol. No other issues     Review of Systems:   No frank SOB at rest, + chronic, stable DOE, mild R>L edema, + abdominal distention.  All other systems were reviewed and are negative except as noted above    Objective:    Current Medications:   epoetin alfa-epbx  4,000 Units Once per day on Fri    furosemide  100 mg 4x Daily    lansoprazole  30 mg QAM AC    medroxyPROGESTERone  1.25 mg Q24H NR    melatonin  5 mg HS    metolazone  5 mg Daily    Prostacyclin Cassette Change   Daily    Prostacyclin Tubing   Once per day on Mon Wed Fri    Prostacylin Batteries   Once per day on Mon    saccharomyces boulardii  250 mg BID    sildenafil  80 mg TID    sodium chloride (PF)  3 mL Q8H    spironolactone  75 mg BID    thiamine  100 mg Daily    vancomycin  125 mg 4x Daily    vitamin B complex-vitamin C-folic acid  1 capsule Daily      DOPamine 2 mcg/kg/min (07/03/17 0710)    epoprostenol (FLOLAN) infusion 35 ng/kg/min (07/03/17 1404)    sodium chloride        acetaminophen  650 mg Q6H PRN    bisacodyl  10 mg Daily PRN    hydrocortisone  1 Application G8Q PRN    nalOXone  0.1 mg Q2 Min PRN    ondansetron  4 mg Q6H PRN    senna  2 tablet Daily PRN    simethicone  80 mg Q6H PRN    sodium chloride (PF)  10 mL PRN    sodium chloride (PF)  3 mL PRN    sodium chloride   Continuous PRN       Vital Signs:  Temperature:  [98 F (36.7  C)-98.2 F (36.8 C)] 98.1 F (36.7 C) (06/21 1548)  Blood pressure (BP): (112-124)/(63-75) 124/64 (06/21 1548)  Heart Rate:  [98-123] 123 (06/21 1225)  Respirations:  [16-20] 18 (06/21 1548)  Pain Score: 0 (06/21 1548)  O2 Device: Nasal cannula (06/21 1225)  O2 Flow Rate (L/min):  [3 l/min] 3 l/min (06/21 1225)  SpO2:  [96 %-100 %] 100 % (06/21 1548)  Wt Readings from Last 1 Encounters:   07/03/17 57.2 kg (126 lb 1.7 oz)       Intake/Output (Current Shift):  06/20 0600 - 06/21 0559  In: 1581.2 [P.O.:1277; I.V.:304.2]  Out: 2701 [Urine:2700]    Physical Exam:  Gen: A&O, conversant. Chronically ill appearing, cachectic.   Eyes: EOMI, pupils equal, anicteric  HENT: Atraumatic, normocephalic  Neck: + JVD, trachea midline  CV: RR +M, accentuated S2, no G/S3. L tunneled CVC without entry site issues, healing blood bullae medial to this.   Resp: Crackles at bases are chronic, no intercostal retractions/normal effort  Abdo: NT ++D, tense, no masses.  Ext: +2 RLEx edema +1 LLEx edema around ankles, no clubbing or cyanosis; no joint abnormalities  Neuro: No focal weakness or sensory deficit, CN grossly intact  Psych: Appropriate affect and mood, good insight. AnOx3  Skin: Normal temperature, no rash or nodules. Multiple bruises      Diagnostic Data:  Laboratory data:   Lab Results   Component Value Date    NA 131 (L) 07/03/2017    K 3.6 07/03/2017    CL 93 (L) 07/03/2017    BICARB 22 07/03/2017    BUN 118 (H) 07/03/2017    CREAT 1.92 (H) 07/03/2017    GLU 94 07/03/2017    [place] 8.5 07/03/2017     Lab Results   Component Value Date    WBC 3.7 (L) 07/03/2017    HGB 7.8 (L) 07/03/2017    HCT 24.6 (L) 07/03/2017    PLT 34 (L) 07/03/2017    SEG 65 07/03/2017    LYMPHS 24 07/03/2017    MONOS 7 07/03/2017    EOS 0 07/03/2017     Lab Results   Component Value Date    ALB 3.3 (L) 07/02/2017     No results found for: INR, PTT      Imaging:  No new studies    Assessment and Plan:  67 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   1. Acute on chronic RV CHF:  - Continue Lasix  - Lyte replacement  - Consider Para in the future  - PAH Rx  - Dopa gtt continued and will need home infusion  2. PAH:  - Continue I Epo and PDE5i  - Diuresis as above  - Dopa as above  3. C Dif:  - D improving  - Vanco PO  4. Thrombocytopenia:  - Follow  - No transfusion unless <10k or bleeding or needing to get procedure done  5. CKD:  - Likely cardiorenal  - Follow UO, BUN/Cr  6. Chronic hypoxemic resp failure:  - O2  7. Lyte abnormalities:  - Replace as needed    Francesca Jewett, MD  Pulmonary and Critical Care  PID [id number] / Pager [phone]

## 2017-07-04 LAB — MAGNESIUM, BLOOD
Magnesium: 2.1 mg/dL (ref 1.6–2.4)
Magnesium: 2.1 mg/dL (ref 1.6–2.4)

## 2017-07-04 LAB — BASIC METABOLIC PANEL, BLOOD
Anion Gap: 16 mmol/L — ABNORMAL HIGH (ref 7–15)
Anion Gap: 16 mmol/L — ABNORMAL HIGH (ref 7–15)
BUN: 118 mg/dL — ABNORMAL HIGH (ref 8–23)
BUN: 122 mg/dL — ABNORMAL HIGH (ref 8–23)
Bicarbonate: 22 mmol/L (ref 22–29)
Bicarbonate: 21 mmol/L — ABNORMAL LOW (ref 22–29)
Calcium: 8.6 mg/dL (ref 8.5–10.6)
Calcium: 8.9 mg/dL (ref 8.5–10.6)
Chloride: 92 mmol/L — ABNORMAL LOW (ref 98–107)
Chloride: 92 mmol/L — ABNORMAL LOW (ref 98–107)
Creatinine: 2.09 mg/dL — ABNORMAL HIGH (ref 0.51–0.95)
Creatinine: 2 mg/dL — ABNORMAL HIGH (ref 0.51–0.95)
GFR: 24 mL/min
GFR: 25 mL/min
Glucose: 114 mg/dL — ABNORMAL HIGH (ref 70–99)
Glucose: 105 mg/dL — ABNORMAL HIGH (ref 70–99)
Potassium: 3.7 mmol/L (ref 3.5–5.1)
Potassium: 4.5 mmol/L (ref 3.5–5.1)
Sodium: 130 mmol/L — ABNORMAL LOW (ref 136–145)
Sodium: 129 mmol/L — ABNORMAL LOW (ref 136–145)

## 2017-07-04 LAB — PHOSPHORUS, BLOOD
Phosphorous: 4.3 mg/dL (ref 2.7–4.5)
Phosphorous: 3.8 mg/dL (ref 2.7–4.5)

## 2017-07-04 MED ORDER — POTASSIUM CHLORIDE 20 MEQ/50ML IV SOLN
20.00 meq | INTRAVENOUS | Status: DC
Start: 2017-07-04 — End: 2017-07-04

## 2017-07-04 MED ORDER — POTASSIUM CHLORIDE 20 MEQ/50ML IV SOLN
20.00 meq | INTRAVENOUS | Status: AC
Start: 2017-07-04 — End: 2017-07-04
  Administered 2017-07-04 (×3): 20 meq via INTRAVENOUS
  Filled 2017-07-04 (×3): qty 50

## 2017-07-04 NOTE — Plan of Care (Signed)
Problem: Promotion of Health and Safety  Goal: Promotion of Health and Safety  Description  The patient remains safe, receives appropriate treatment and achieves optimal outcomes (physically, psychosocially, and spiritually) within the limitations of the disease process by discharge.    Information below is the current care plan.  Outcome: Progressing  Flowsheets  Taken 07/03/2017 2035 by Jacquelynn Cree, Nechama Guard, RN  Patient /Family stated Goal: get better  Taken 07/03/2017 0436 by Madlyn Frankel, RN  Guidelines: Inpatient Nursing Guidelines  Taken 07/03/2017 1622 by Vinie Sill, RN  Individualized Interventions/Recommendations #1: Continue oral vanco for treatment of CDIFF and maintain isolation precautions  Individualized Interventions/Recommendations #2 (if applicable): Maintain strict I/O's and reinforce importance of fluid restriction while pt being diuresed  Individualized Interventions/Recommendations #4 (if applicable): Monitor pt's BM's while in treatment for CDIFF  Taken 07/04/2017 0400 by Madlyn Frankel, RN  Outcome Evaluation (rationale for progressing/not progressing) every shift: pt voiding through foley with moderate output(see I&O's). pt still with BM but now formed. pt still with edema and abdomen still distended.

## 2017-07-04 NOTE — Progress Notes (Signed)
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    Overnight events:Continues on IV Dopa and Epo, as well as around the clock diuretic. Thinks she is making less urine. Some HA that responded to tylenol. Improving edema, stable ascites. D a little better    Review of Systems:   No frank SOB at rest, +DOE, + edema, + abdominal distention.  All other systems were reviewed and are negative except as noted above    Objective:    Current Medications:   epoetin alfa-epbx  4,000 Units Once per day on Fri    furosemide  100 mg 4x Daily    lansoprazole  30 mg QAM AC    medroxyPROGESTERone  1.25 mg Q24H NR    melatonin  5 mg HS    metolazone  5 mg Daily    potassium chloride  20 mEq Q2H    Prostacyclin Cassette Change   Daily    Prostacyclin Tubing   Once per day on Mon Wed Fri    Prostacylin Batteries   Once per day on Mon    saccharomyces boulardii  250 mg BID    sildenafil  80 mg TID    sodium chloride (PF)  3 mL Q8H    spironolactone  75 mg BID    thiamine  100 mg Daily    vancomycin  125 mg 4x Daily    vitamin B complex-vitamin C-folic acid  1 capsule Daily      DOPamine 2 mcg/kg/min (07/04/17 0940)    epoprostenol (FLOLAN) infusion 35 ng/kg/min (07/04/17 1406)    sodium chloride        acetaminophen  650 mg Q6H PRN    bisacodyl  10 mg Daily PRN    hydrocortisone  1 Application P3A PRN    nalOXone  0.1 mg Q2 Min PRN    ondansetron  4 mg Q6H PRN    senna  2 tablet Daily PRN    simethicone  80 mg Q6H PRN    sodium chloride (PF)  10 mL PRN    sodium chloride (PF)  3 mL PRN    sodium chloride   Continuous PRN       Vital Signs:  Temperature:  [97.8 F (36.6 C)-98.5 F (36.9 C)] 97.8 F (36.6 C) (06/22 1522)  Blood pressure (BP): (110-124)/(56-72) 112/64  (06/22 1522)  Heart Rate:  [95-113] 102 (06/22 1522)  Respirations:  [16-20] 20 (06/22 1522)  Pain Score: 0 (06/22 1522)  O2 Device: Nasal cannula (06/22 0738)  O2 Flow Rate (L/min):  [3 l/min] 3 l/min (06/22 0738)  SpO2:  [96 %-100 %] 99 % (06/22 1522)  Wt Readings from Last 1 Encounters:   07/04/17 57.8 kg (127 lb 6.8 oz)       Intake/Output (Current Shift):  06/21 0600 - 06/22 0559  In: 250 [P.O.:697; I.V.:157]  Out: 1450 [Urine:1450]    Physical Exam:  Gen: A&O, conversing comfortably. Chronically ill appearing, cachectic.   Eyes: EOMI, pupils equal, anicteric  HENT: Atraumatic, normocephalic  Neck: + JVD, trachea midline  CV: RR +M, no R/G/S3. Accentuated S2. L tunneled CVC without entry site issues, healing blood bullae medial to this.  Resp: Crackles at L base, no intercostal retractions/normal effort  Abdo: NT ++D, soft, no masses.  Ext:  +2 RLEx edema +1 LLEx edema around ankles, no clubbing or cyanosis; no joint abnormalities  Neuro: No focal weakness or sensory deficit, CN grossly intact  Psych: Appropriate affect and mood, good insight. AnOx3  Skin: Normal temperature, no rash or nodules. Multiple bruises and blood blisters      Diagnostic Data:  Laboratory data:   Lab Results   Component Value Date    NA 130 (L) 07/04/2017    K 3.7 07/04/2017    CL 92 (L) 07/04/2017    BICARB 22 07/04/2017    BUN 118 (H) 07/04/2017    CREAT 2.09 (H) 07/04/2017    GLU 114 (H) 07/04/2017    [place] 8.6 07/04/2017     Lab Results   Component Value Date    WBC 3.7 (L) 07/03/2017    HGB 7.8 (L) 07/03/2017    HCT 24.6 (L) 07/03/2017    PLT 34 (L) 07/03/2017    SEG 65 07/03/2017    LYMPHS 24 07/03/2017    MONOS 7 07/03/2017    EOS 0 07/03/2017     No results found for: AST, ALT, ALK, TBILI, DBILI, TP, ALB  No results found for: INR, PTT      Imaging:  No new studies    Assessment and Plan:  67 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   1. 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 PAH:  - Continue IV Epo and PDE5i  - Diuresis as above  - Dopa as above  3. C Dif:  - D improving  - Vanco PO  4. Thrombocytopenia:  - Follow  - No transfusion unless <10k or bleeding or needing to get procedure done  5. CKD:  - Likely cardiorenal  - Follow UO, BUN/Cr  6. Chronic hypoxemic resp failure:  - O2  7. Lyte abnormalities:  - Replace as needed    Francesca Jewett, MD  Pulmonary and Critical Care  PID [phone] / Pager [phone]

## 2017-07-04 NOTE — Plan of Care (Signed)
Problem: Promotion of Health and Safety  Goal: Promotion of Health and Safety  Description  The patient remains safe, receives appropriate treatment and achieves optimal outcomes (physically, psychosocially, and spiritually) within the limitations of the disease process by discharge.    Information below is the current care plan.  Outcome: Progressing  Flowsheets  Taken 07/04/2017 0730  Patient /Family stated Goal: "For my platelet to go up"  Taken 07/04/2017 1438  Guidelines: Inpatient Nursing Guidelines  Individualized Interventions/Recommendations #4 (if applicable): Monitor CBC with platelets as ordered every other day and monitor for s/o bleeding while +thrombocyopenia  Individualized Interventions/Recommendations #5 (if applicable): Monitor BMs while in treament for CDIFF  Outcome Evaluation (rationale for progressing/not progressing) every shift: Pt feels UOP less tpday and "bladder feels full" but foley remains intact and draining without difficulty.  CBC not ordered today but platelets remain low; no new s.o bleeding.  Pt complient with fluid restriction and remains on lasix QID.  BMs frequent but semi-formed today. Safety maintained this shift.  Taken 07/03/2017 1622  Individualized Interventions/Recommendations #1: Continue oral vanco for treatment of CDIFF and maintain isolation precautions  Individualized Interventions/Recommendations #2 (if applicable): Maintain strict I/O's and reinforce importance of fluid restriction while pt being diuresed  Individualized Interventions/Recommendations #3 (if applicable): Monitor pt's tolerance while on dopamin and flolan infusions; ensure pt has dedicated line and backup cassette at bedside

## 2017-07-05 ENCOUNTER — Inpatient Hospital Stay (HOSPITAL_COMMUNITY): Payer: PPO

## 2017-07-05 DIAGNOSIS — R600 Localized edema: Secondary | ICD-10-CM

## 2017-07-05 LAB — BASIC METABOLIC PANEL, BLOOD
Anion Gap: 15 mmol/L (ref 7–15)
Anion Gap: 17 mmol/L — ABNORMAL HIGH (ref 7–15)
BUN: 119 mg/dL — ABNORMAL HIGH (ref 8–23)
BUN: 127 mg/dL — ABNORMAL HIGH (ref 8–23)
Bicarbonate: 22 mmol/L (ref 22–29)
Bicarbonate: 21 mmol/L — ABNORMAL LOW (ref 22–29)
Calcium: 8.8 mg/dL (ref 8.5–10.6)
Calcium: 8.7 mg/dL (ref 8.5–10.6)
Chloride: 91 mmol/L — ABNORMAL LOW (ref 98–107)
Chloride: 92 mmol/L — ABNORMAL LOW (ref 98–107)
Creatinine: 1.57 mg/dL — ABNORMAL HIGH (ref 0.51–0.95)
Creatinine: 1.92 mg/dL — ABNORMAL HIGH (ref 0.51–0.95)
GFR: 33 mL/min
GFR: 26 mL/min
Glucose: 102 mg/dL — ABNORMAL HIGH (ref 70–99)
Glucose: 154 mg/dL — ABNORMAL HIGH (ref 70–99)
Potassium: 3.7 mmol/L (ref 3.5–5.1)
Potassium: 3.4 mmol/L — ABNORMAL LOW (ref 3.5–5.1)
Sodium: 128 mmol/L — ABNORMAL LOW (ref 136–145)
Sodium: 130 mmol/L — ABNORMAL LOW (ref 136–145)

## 2017-07-05 LAB — PHOSPHORUS, BLOOD: Phosphorous: 4.1 mg/dL (ref 2.7–4.5)

## 2017-07-05 LAB — MAGNESIUM, BLOOD: Magnesium: 2 mg/dL (ref 1.6–2.4)

## 2017-07-05 MED ORDER — POTASSIUM CHLORIDE CRYS CR 10 MEQ OR TBCR
10.00 meq | EXTENDED_RELEASE_TABLET | Freq: Once | ORAL | Status: AC
Start: 2017-07-05 — End: 2017-07-05
  Administered 2017-07-05: 10 meq via ORAL
  Filled 2017-07-05: qty 1

## 2017-07-05 MED ORDER — POTASSIUM CHLORIDE 20 MEQ/50ML IV SOLN
20.00 meq | Freq: Once | INTRAVENOUS | Status: AC
Start: 2017-07-05 — End: 2017-07-06
  Administered 2017-07-05 (×2): 20 meq via INTRAVENOUS
  Filled 2017-07-05: qty 50

## 2017-07-05 NOTE — Progress Notes (Signed)
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    Overnight events:Continues on IV Dopa and Epo, as well as around the clock diuretic. Similar UO than yesterday. Some HA last night again. No other issues. Less D, and stool is still soft and not fully formed.     Review of Systems:   No frank SOB at rest, + stable DOE, + RLEx edema, ++ abdominal distention.  All other systems were reviewed and are negative except as noted above    Objective:    Current Medications:   epoetin alfa-epbx  4,000 Units Once per day on Fri    furosemide  100 mg 4x Daily    lansoprazole  30 mg QAM AC    medroxyPROGESTERone  1.25 mg Q24H NR    melatonin  5 mg HS    metolazone  5 mg Daily    Prostacyclin Cassette Change   Daily    Prostacyclin Tubing   Once per day on Mon Wed Fri    Prostacylin Batteries   Once per day on Mon    saccharomyces boulardii  250 mg BID    sildenafil  80 mg TID    sodium chloride (PF)  3 mL Q8H    spironolactone  75 mg BID    thiamine  100 mg Daily    vancomycin  125 mg 4x Daily    vitamin B complex-vitamin C-folic acid  1 capsule Daily      DOPamine 2 mcg/kg/min (07/05/17 0745)    epoprostenol (FLOLAN) infusion 35 ng/kg/min (07/05/17 1340)    sodium chloride        acetaminophen  650 mg Q6H PRN    bisacodyl  10 mg Daily PRN    hydrocortisone  1 Application M5H PRN    nalOXone  0.1 mg Q2 Min PRN    ondansetron  4 mg Q6H PRN    senna  2 tablet Daily PRN    simethicone  80 mg Q6H PRN    sodium chloride (PF)  10 mL PRN    sodium chloride (PF)  3 mL PRN    sodium chloride   Continuous PRN       Vital Signs:  Temperature:  [98.1 F (36.7 C)-98.7 F (37.1 C)] 98.3 F (36.8 C) (06/23 1137)  Blood pressure (BP): (111-117)/(57-70) 116/57 (06/23 1137)  Heart  Rate:  [97-102] 97 (06/23 1137)  Respirations:  [20] 20 (06/23 1137)  Pain Score: 0 (06/23 1137)  O2 Device: Nasal cannula (06/23 1137)  O2 Flow Rate (L/min):  [3 l/min] 3 l/min (06/23 1137)  SpO2:  [95 %-98 %] 97 % (06/23 1137)  Wt Readings from Last 1 Encounters:   07/05/17 58.1 kg (128 lb)       Intake/Output (Current Shift):  06/22 0600 - 06/23 0559  In: 1195.6 [P.O.:940; I.V.:255.6]  Out: 1740 [Urine:1740]    Physical Exam:  Gen: A&O, conversant; chronically ill appearing and cachectic  Eyes: EOMI, pupils equal, anicteric  HENT: Atraumatic, normocephalic  Neck: + JVD, trachea midline  CV: RRR, + M, no R/G/S3. R chest tunneled CVC with adjacent skin blood blister  Resp: Basilar crackles and rest CTAB, no intercostal retractions/normal effort  Abdo: NT ++D, tense  Ext: +2 RLEx edema compared to LLEx which has only mild edema, no clubbing or cyanosis; no joint abnormalities  Neuro: No focal weakness or sensory deficit, CN grossly intact  Psych: Appropriate affect and mood, good insight. AnOx3  Skin: Normal temperature, no rash or nodules. Multiple bruises and blood blisters.       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   1. 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 PAH:  - Continue IV Epo and PDE5i  - Diuresis as above  - Dopa as above  3. C Dif:  - D improving  - Vanco PO  4. Thrombocytopenia:  - Follow  - Notransfusionunless <10k or bleeding or needing to get procedure done  5. CKD:  - Likely cardiorenal  - Follow UO, BUN/Cr  6. Chronic hypoxemic resp failure:  - O2  7. Lyte abnormalities:  - Replace as needed  8. RLEx edema> L:  - Get LEx doppler to r/o DVT on R  - Diuresis as above    Francesca Jewett, MD  Pulmonary and Critical Care  PID [id number] / Pager [phone]

## 2017-07-05 NOTE — Plan of Care (Signed)
Problem: Promotion of Health and Safety  Goal: Promotion of Health and Safety  Description  The patient remains safe, receives appropriate treatment and achieves optimal outcomes (physically, psychosocially, and spiritually) within the limitations of the disease process by discharge.    Information below is the current care plan.  Outcome: Progressing  Flowsheets  Taken 07/04/2017 2059 by Darlin Drop, RN  Patient /Family stated Goal: To sleep as much as I can  Taken 07/05/2017 0112 by Darlin Drop, RN  Guidelines: Inpatient Nursing Guidelines  Individualized Interventions/Recommendations #5 (if applicable): Monitor labs BID d/t diuretics  Outcome Evaluation (rationale for progressing/not progressing) every shift: Pt's VSS. 3L NC, denies SOB. Flolan via CVC and Dopamine via DL PICC continue infusing. Pt's skin extremely fragile, ecchymotic. Pt aware of fluid restriction. Foley output monitored q4hrs. Abd distended. Tylenol PO given for HA. Will continue to monitor labs, lytes, u/o, gtts, abd distention, respiratory status, skin integrity, and overall status.  Taken 07/03/2017 1622 by Vinie Sill, RN  Individualized Interventions/Recommendations #1: Continue oral vanco for treatment of CDIFF and maintain isolation precautions  Individualized Interventions/Recommendations #2 (if applicable): Maintain strict I/O's and reinforce importance of fluid restriction while pt being diuresed  Individualized Interventions/Recommendations #3 (if applicable): Monitor pt's tolerance while on dopamin and flolan infusions; ensure pt has dedicated line and backup cassette at bedside  Taken 07/04/2017 1438 by Vinie Sill, RN  Individualized Interventions/Recommendations #4 (if applicable): Monitor CBC with platelets as ordered every other day and monitor for s/o bleeding while +thrombocyopenia

## 2017-07-05 NOTE — Plan of Care (Signed)
Problem: Promotion of Health and Safety  Goal: Promotion of Health and Safety  Description  The patient remains safe, receives appropriate treatment and achieves optimal outcomes (physically, psychosocially, and spiritually) within the limitations of the disease process by discharge.    Information below is the current care plan.  Outcome: Progressing  Flowsheets  Taken 07/05/2017 0745 by Gabriel Cirri, RN  Patient /Family stated Goal: get better  Taken 07/05/2017 1046 by Gabriel Cirri, RN  Guidelines: Inpatient Nursing Guidelines  Outcome Evaluation (rationale for progressing/not progressing) every shift: Flolan and dopamine infusing as ordered. Lab draws BID, VSS. Pt with foley, pt verbalizes understanding of plan for diuresis.  Taken 07/03/2017 1622 by Vinie Sill, RN  Individualized Interventions/Recommendations #1: Continue oral vanco for treatment of CDIFF and maintain isolation precautions  Individualized Interventions/Recommendations #2 (if applicable): Maintain strict I/O's and reinforce importance of fluid restriction while pt being diuresed  Individualized Interventions/Recommendations #3 (if applicable): Monitor pt's tolerance while on dopamin and flolan infusions; ensure pt has dedicated line and backup cassette at bedside  Taken 07/04/2017 1438 by Vinie Sill, RN  Individualized Interventions/Recommendations #4 (if applicable): Monitor CBC with platelets as ordered every other day and monitor for s/o bleeding while +thrombocyopenia  Taken 07/05/2017 0112 by Darlin Drop, RN  Individualized Interventions/Recommendations #5 (if applicable): Monitor labs BID d/t diuretics

## 2017-07-06 LAB — CBC WITH DIFF, BLOOD
ANC-Automated: 2.9 10*3/uL (ref 1.6–7.0)
Abs Basophils: 0 10*3/uL (ref <0.1)
Abs Eosinophils: 0 10*3/uL (ref 0.1–0.5)
Abs Lymphs: 0.9 10*3/uL (ref 0.8–3.1)
Abs Monos: 0.3 10*3/uL (ref 0.2–0.8)
Basophils: 0 %
Eosinophils: 1 %
Hct: 24.8 % — ABNORMAL LOW (ref 34.0–45.0)
Hgb: 7.8 gm/dL — ABNORMAL LOW (ref 11.2–15.7)
Imm Gran %: 2 % — ABNORMAL HIGH (ref <1)
Imm Gran Abs: 0.1 10*3/uL (ref <0.1)
Lymphocytes: 21 %
MCH: 25.7 pg — ABNORMAL LOW (ref 26.0–32.0)
MCHC: 31.5 g/dL — ABNORMAL LOW (ref 32.0–36.0)
MCV: 81.8 um3 (ref 79.0–95.0)
Monocytes: 6 %
Plt Count: 41 10*3/uL — ABNORMAL LOW (ref 140–370)
RBC: 3.03 10*6/uL — ABNORMAL LOW (ref 3.90–5.20)
RDW: 18.8 % — ABNORMAL HIGH (ref 12.0–14.0)
Segs: 70 %
WBC: 4.1 10*3/uL (ref 4.0–10.0)

## 2017-07-06 LAB — BASIC METABOLIC PANEL, BLOOD
Anion Gap: 16 mmol/L — ABNORMAL HIGH (ref 7–15)
BUN: 126 mg/dL — ABNORMAL HIGH (ref 8–23)
Bicarbonate: 22 mmol/L (ref 22–29)
Calcium: 8.7 mg/dL (ref 8.5–10.6)
Chloride: 91 mmol/L — ABNORMAL LOW (ref 98–107)
Creatinine: 1.7 mg/dL — ABNORMAL HIGH (ref 0.51–0.95)
GFR: 30 mL/min
Glucose: 105 mg/dL — ABNORMAL HIGH (ref 70–99)
Potassium: 3.7 mmol/L (ref 3.5–5.1)
Sodium: 129 mmol/L — ABNORMAL LOW (ref 136–145)

## 2017-07-06 LAB — VITAMIN D, 25-OH TOTAL
Vitamin D, 25-OH D2: <5 ng/mL
Vitamin D, 25-OH D3: 38 ng/mL
Vitamin D, 25-OH TOTAL: 38 ng/mL (ref 30–80)

## 2017-07-06 LAB — MAGNESIUM, BLOOD: Magnesium: 2.1 mg/dL (ref 1.6–2.4)

## 2017-07-06 LAB — PHOSPHORUS, BLOOD: Phosphorous: 4.5 mg/dL (ref 2.7–4.5)

## 2017-07-06 MED ORDER — VITAMIN B-12 100 MCG OR TABS
50.00 ug | ORAL_TABLET | Freq: Every day | ORAL | Status: DC
Start: 2017-07-06 — End: 2017-07-15
  Administered 2017-07-06 – 2017-07-15 (×10): 50 ug via ORAL
  Filled 2017-07-06 (×10): qty 1

## 2017-07-06 MED ORDER — POTASSIUM CHLORIDE 20 MEQ/50ML IV SOLN
20.00 meq | INTRAVENOUS | Status: AC
Start: 2017-07-06 — End: 2017-07-06
  Administered 2017-07-06 (×3): 20 meq via INTRAVENOUS
  Filled 2017-07-06 (×3): qty 50

## 2017-07-06 MED ORDER — FOLIC ACID 1 MG OR TABS
1.00 mg | ORAL_TABLET | Freq: Every day | ORAL | Status: DC
Start: 2017-07-06 — End: 2017-07-06

## 2017-07-06 MED ORDER — VANCOMYCIN 50 MG/ML ORAL SOLN (COMPOUNDED) (~~LOC~~)
125.00 mg | Freq: Four times a day (QID) | ORAL | Status: AC
Start: 2017-07-06 — End: 2017-07-11
  Administered 2017-07-06 – 2017-07-11 (×21): 125 mg via ORAL
  Filled 2017-07-06 (×21): qty 2.5

## 2017-07-06 NOTE — Interdisciplinary (Signed)
See spiritual care intervention flow sheet for detailed info about the visit.

## 2017-07-06 NOTE — Interdisciplinary (Addendum)
07/06/17 1012   Follow Up/Progress   Is the Patient Ready for Discharge No   Supplies/Services  Too soon to be determined   Barriers to Discharge Awaiting clinical improvement  (Platelets 41)   Patient/Family Are In Agreement With Discharge Plan No   Plan/Interventions Explore needs and options for aftercare, provide referrals     Met with [facility] liason, she informed this CM that the standard for qualifying for home inotrope follows [facility] criteria and additional documentation just be applied, pharmacy should receive documentation within 1 day of discharge to assure need is accurate

## 2017-07-06 NOTE — Plan of Care (Signed)
Problem: Promotion of Health and Safety  Goal: Promotion of Health and Safety  Description  The patient remains safe, receives appropriate treatment and achieves optimal outcomes (physically, psychosocially, and spiritually) within the limitations of the disease process by discharge.    Information below is the current care plan.  Outcome: Progressing  Flowsheets  Taken 07/06/2017 0750 by Gabriel Cirri, RN  Patient /Family stated Goal: go home soon  Taken 07/06/2017 1258 by Gabriel Cirri, RN  Guidelines: Inpatient Nursing Guidelines  Outcome Evaluation (rationale for progressing/not progressing) every shift: VSS, K+ replaced, oral vanco schedule extended. Will continue to monitor.  Taken 07/03/2017 1622 by Vinie Sill, RN  Individualized Interventions/Recommendations #1: Continue oral vanco for treatment of CDIFF and maintain isolation precautions  Individualized Interventions/Recommendations #2 (if applicable): Maintain strict I/O's and reinforce importance of fluid restriction while pt being diuresed  Individualized Interventions/Recommendations #3 (if applicable): Monitor pt's tolerance while on dopamin and flolan infusions; ensure pt has dedicated line and backup cassette at bedside  Taken 07/06/2017 0009 by Wipperfeld, Willia Craze, RN  Individualized Interventions/Recommendations #4 (if applicable): Monitor lytes, esp with diuretics multiple times throughout the day

## 2017-07-06 NOTE — Plan of Care (Signed)
Problem: Promotion of Health and Safety  Goal: Promotion of Health and Safety  Description  The patient remains safe, receives appropriate treatment and achieves optimal outcomes (physically, psychosocially, and spiritually) within the limitations of the disease process by discharge.    Information below is the current care plan.  Outcome: Progressing  Flowsheets  Taken 07/05/2017 2100 by Darlin Drop, RN  Patient /Family stated Goal: Get better and sleep  Taken 07/06/2017 0009 by Wipperfeld, Willia Craze, RN  Guidelines: Inpatient Nursing Guidelines  Individualized Interventions/Recommendations #4 (if applicable): Monitor lytes, esp with diuretics multiple times throughout the day  Individualized Interventions/Recommendations #5 (if applicable): Assess Pt's skin integrity, encourage changing positions frequently  Outcome Evaluation (rationale for progressing/not progressing) every shift: Pt's VSS, continues on 3L NC. Flolan and dopamine infusing per MAR. Per MAR K 3.7 not replaced in AM, MD notified. BMP redrawn, K 3.4. K replaced via IV and PO. Foley remains with adequate u/o. Pt turns and repositions self. WIll continue to monitor Pt's respiratory status, skin, labs and lytes, u/o, and overall status.  Taken 07/03/2017 1622 by Vinie Sill, RN  Individualized Interventions/Recommendations #1: Continue oral vanco for treatment of CDIFF and maintain isolation precautions  Individualized Interventions/Recommendations #2 (if applicable): Maintain strict I/O's and reinforce importance of fluid restriction while pt being diuresed  Individualized Interventions/Recommendations #3 (if applicable): Monitor pt's tolerance while on dopamin and flolan infusions; ensure pt has dedicated line and backup cassette at bedside

## 2017-07-06 NOTE — Progress Notes (Signed)
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    Subjective:  The patient reports having to belch frequently overnight and continues to have abdominal distension and loose stools. Patient thinks her c. Diff is not improving much. She denies chest pain, fevers, chills, nausea, vomiting, or lightheadedness. She had a negative LE DVT study.    ROS reviewed and unchanged    I/O:  Intake/Output       07/05/17 0600 - 07/06/17 0559 07/06/17 0600 - 07/07/17 0559      [id number] Total 0600-1759 1478-2956 Total       Intake    P.O.  937  1197 2134  740  -- 740    I.V.  139.5  107.1 246.6  100  -- 100    Total Intake 1076.5 1304.1 2380.6 840 -- 840       Output    Urine  850  495 1345  450  -- 450    Total Output [phone] 450 -- 450       Net I/O     226.5 809.1 1035.6 390 -- 390            PE:  Temperature:  [98 F (36.7 C)-99 F (37.2 C)] 99 F (37.2 C) (06/24 1216)  Blood pressure (BP): (105-132)/(54-64) 132/64 (06/24 1216)  Heart Rate:  [98-106] 106 (06/24 1216)  Respirations:  [18-22] 18 (06/24 1216)  Pain Score: 0 (06/24 1216)  O2 Device: Nasal cannula (06/24 1216)  O2 Flow Rate (L/min):  [3 l/min] 3 l/min (06/24 1216)  SpO2:  [96 %-99 %] 98 % (06/24 1216)  Physical Exam   Constitutional: She is oriented to person, place, and time. No distress.   HENT:   Head: Normocephalic and atraumatic.   Eyes: Right eye exhibits no discharge. Left eye exhibits no discharge. No scleral icterus.   Neck: Normal range of motion. Neck supple.   Cardiovascular: Normal rate.   Murmur heard.  Pulmonary/Chest: Effort normal. No respiratory distress. She has no wheezes. She has rales.   Abdominal: Soft.  She exhibits distension. There is no tenderness.   Musculoskeletal: She exhibits edema (+1-2 bilateral lower extremity edema, right greater than left).   Neurological: She is alert and oriented to person, place, and time.   Skin: Skin is warm and dry. She is not diaphoretic.       Current Labs  Recent Labs     07/06/17  0530   WBC 4.1   RBC 3.03*   HGB 7.8*   HCT 24.8*   MCV 81.8   MCHC 31.5*   RDW 18.8*   PLT 41*     Recent Labs     07/06/17  0530   NA 129*   K 3.7   CL 91*   BICARB 22   BUN 126*   CREAT 1.70*   GLU 105*   [place] 8.7       Imaging:  07/05/17 - LE DVT study - No evidence of deep venous thrombosis in the examined veins.    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 Now with Clostridium difficile. Has developed pancytopenia.    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 Diff colitis - continue po vanc(day 10), will extend for 14 day course given symptoms persisting  -diarrhea slowly improving though still symptomatic    4) Hypokalemia - replete aggressively, will change to daily BMPs    5) Hypoxemic  respiratory failure - continue oxygen, diurese as able    6) afib - continue low dose dopamine  -rate controlled currently    7) Thrombocytopenia - may be related to c. Diff vs autoimmune hemolysis  -will trend for now, trying to avoid platelets for para and line if possible but can transfuse if patient and family willing  -will likely need transfusion in next day or so for procedures if doesn't improve     8) CKD - likely in part secondary to heart disease  -improving some with diuresis, suggesting cardiorenal    9) Hyponatremia - likely due to heart failure/volume overload    Patient seen and discussed with attending, Dr. [name].    Diana Gosselin, MD, MPH  Pulmonary/Critical Care Fellow  Pager # [phone]      Pulmonary Vascular Attending Addendum:  Patient was examined withDr.Barry  Labs, imaging and tests reviewed.  Please see above note for full details and plan.    67 yo F with WHO I PAH associated with SLE/sjogrens, LIPand prior anorexogen use admitted with acute on chronic right heart failure and volume overload as well as AKI and thrombocytopenia.IRparacentesis on6/10 5.5 Lremoved.    Edema improved but persistent ascites. + C diff infection ongoing    -Repeat therapeutic parawhen plt > 50 k.  - Begin plans to arrange home dopamine @ 2 mcg/kg/min  - Will need tunneled line switched to double lumen  - Lasix 100 mg QID  -Continuemetolazone 5 mg  -Dopamine72mcg/kg/min  -Spironolactoneand replete K  - Continue home sildenafil and flolan at 35 ng/kg/min  - Continue PO vanco(day5) for cdiff  - Continue oxygen suppl  - Fluid restrict to 1.5 L/day  -Completed iron sucrose for iron def anemia. Epogen Qweek.  - Discussed goals of care at length with patient and husband.While they understand that her condition is deteriorating and this is likely terminal progression of her disease they are not yet ready for hospice or palliative care services.    [name] M.D.

## 2017-07-07 LAB — CBC WITH DIFF, BLOOD
ANC-Automated: 2.5 10*3/uL (ref 1.6–7.0)
Abs Basophils: 0 10*3/uL (ref <0.1)
Abs Eosinophils: 0 10*3/uL (ref 0.1–0.5)
Abs Lymphs: 1.1 10*3/uL (ref 0.8–3.1)
Abs Monos: 0.2 10*3/uL (ref 0.2–0.8)
Basophils: 0 %
Eosinophils: 0 %
Hct: 25.6 % — ABNORMAL LOW (ref 34.0–45.0)
Hgb: 8.1 gm/dL — ABNORMAL LOW (ref 11.2–15.7)
Imm Gran %: 2 % — ABNORMAL HIGH (ref <1)
Imm Gran Abs: 0.1 10*3/uL (ref <0.1)
Lymphocytes: 28 %
MCH: 26 pg (ref 26.0–32.0)
MCHC: 31.6 g/dL — ABNORMAL LOW (ref 32.0–36.0)
MCV: 82.3 um3 (ref 79.0–95.0)
Monocytes: 6 %
Plt Count: 46 10*3/uL — ABNORMAL LOW (ref 140–370)
RBC: 3.11 10*6/uL — ABNORMAL LOW (ref 3.90–5.20)
RDW: 19.1 % — ABNORMAL HIGH (ref 12.0–14.0)
Segs: 63 %
WBC: 3.9 10*3/uL — ABNORMAL LOW (ref 4.0–10.0)

## 2017-07-07 LAB — BASIC METABOLIC PANEL, BLOOD
Anion Gap: 17 mmol/L — ABNORMAL HIGH (ref 7–15)
BUN: 123 mg/dL — ABNORMAL HIGH (ref 8–23)
Bicarbonate: 20 mmol/L — ABNORMAL LOW (ref 22–29)
Calcium: 8.9 mg/dL (ref 8.5–10.6)
Chloride: 93 mmol/L — ABNORMAL LOW (ref 98–107)
Creatinine: 2.17 mg/dL — ABNORMAL HIGH (ref 0.51–0.95)
GFR: 23 mL/min
Glucose: 138 mg/dL — ABNORMAL HIGH (ref 70–99)
Potassium: 3.4 mmol/L — ABNORMAL LOW (ref 3.5–5.1)
Sodium: 130 mmol/L — ABNORMAL LOW (ref 136–145)

## 2017-07-07 LAB — MAGNESIUM, BLOOD: Magnesium: 2 mg/dL (ref 1.6–2.4)

## 2017-07-07 LAB — PHOSPHORUS, BLOOD: Phosphorous: 4.9 mg/dL — ABNORMAL HIGH (ref 2.7–4.5)

## 2017-07-07 MED ORDER — POTASSIUM CHLORIDE 20 MEQ/50ML IV SOLN
20.00 meq | INTRAVENOUS | Status: AC
Start: 2017-07-07 — End: 2017-07-07
  Administered 2017-07-07 (×4): 20 meq via INTRAVENOUS
  Filled 2017-07-07 (×4): qty 50

## 2017-07-07 NOTE — Plan of Care (Signed)
Problem: Promotion of Health and Safety  Goal: Promotion of Health and Safety  Description  The patient remains safe, receives appropriate treatment and achieves optimal outcomes (physically, psychosocially, and spiritually) within the limitations of the disease process by discharge.    Information below is the current care plan.  Flowsheets  Taken 07/07/2017 0800 by Bosie Helper, RN  Patient /Family stated Goal: get better  Taken 07/07/2017 0107 by Dorothyann Gibbs, RN  Guidelines: Inpatient Nursing Guidelines  Taken 07/07/2017 1039 by Bosie Helper, RN  Individualized Interventions/Recommendations #1: encouraged pt to verbalized concerns and questions regarding her plan of care  Individualized Interventions/Recommendations #2 (if applicable): encouraged to walk around unit, assist to ambulate if needed  Individualized Interventions/Recommendations #3 (if applicable): continue to observed strict isolation precaution, remind pt and pt's family the importance of frequent hand washing  Individualized Interventions/Recommendations #4 (if applicable): continue to monitor pt's skin integrity, remind pt to frequently change position while sitting or in bed  Individualized Interventions/Recommendations #5 (if applicable): encouraged pt's family to assist in pt's care and ADLs  Outcome Evaluation (rationale for progressing/not progressing) every shift: pt able to verbalized concerns and questions regarding her plan of care and importance of hand washing. pt's family/son observed to be very supportive and helpful with pt care. pt's VSS tolerating simple activities on 2 L NC needing minimal assist for her IV lines. pt denies acute SOB. pt's skin condition remains the same

## 2017-07-07 NOTE — Progress Notes (Signed)
[facility] 52 Days 19 Hours      Subjective: Diarrhea improving    Medications:  Scheduled Meds   epoetin alfa-epbx  4,000 Units Once per day on Fri    furosemide  100 mg 4x Daily    lansoprazole  30 mg QAM AC    medroxyPROGESTERone  1.25 mg Q24H NR    melatonin  5 mg HS    metolazone  5 mg Daily    Prostacyclin Cassette Change   Daily    Prostacyclin Tubing   Once per day on Mon Wed Fri    Prostacylin Batteries   Once per day on Mon    saccharomyces boulardii  250 mg BID    sildenafil  80 mg TID    sodium chloride (PF)  3 mL Q8H    spironolactone  75 mg BID    thiamine  100 mg Daily    vancomycin  125 mg 4x Daily    vitamin B complex-vitamin C-folic acid  1 capsule Daily    vitamin b-12  50 mcg Daily     PRN Meds   acetaminophen  650 mg Q6H PRN    bisacodyl  10 mg Daily PRN    hydrocortisone  1 Application V4Q PRN    nalOXone  0.1 mg Q2 Min PRN    ondansetron  4 mg Q6H PRN    senna  2 tablet Daily PRN    simethicone  80 mg Q6H PRN    sodium chloride (PF)  10 mL PRN    sodium chloride (PF)  3 mL PRN    sodium chloride   Continuous PRN     IV Meds   DOPamine 2 mcg/kg/min (07/07/17 0730)    epoprostenol (FLOLAN) infusion 35 ng/kg/min (07/07/17 1337)    sodium chloride         Vitals:    Latest Entry  Range (last 24 hours)    Temperature: 98.4 F (36.9 C)  Temp  Avg: 98.2 F (36.8 C)  Min: 97.9 F (36.6 C)  Max: 98.5 F (36.9 C)    Blood pressure (BP): 114/63  BP  Min: 106/57  Max: 133/66    Heart Rate: 97  Pulse  Avg: 100.9  Min: 97  Max: 105    Respirations: 19  Resp  Avg: 19  Min: 18  Max: 20    SpO2: 99 %  SpO2  Avg: 97.8 %  Min: 97 %  Max: 99 %       No data recorded     Weight: 58.1 kg (128 lb 1.4 oz)  Percentage Weight Change (%): -0.51 %           Intake/Output Summary (Last 24 hours) at 07/07/2017 1802  Last data filed at 07/07/2017 1734  Gross per 24 hour   Intake 982.51 ml   Output 1952 ml   Net -969.49 ml       Exam:   Physical  Exam   + JVD  RRR + TR no S3  Rales bilat  Distended non tender + ascites  1+ edema LE bilat    Labs:   CBC  Recent Labs     07/06/17  0530 07/07/17  0400   WBC 4.1 3.9*   HGB 7.8* 8.1*   HCT 24.8* 25.6*   PLT 41* 46*   SEG 70 63   LYMPHS 21 28   MONOS 6 6      Chemistry  Recent Labs     07/06/17  0530 07/07/17  0850   NA 129* 130*   K 3.7 3.4*   CL 91* 93*   BICARB 22 20*   BUN 126* 123*   CREAT 1.70* 2.17*   GLU 105* 138*   [place] 8.7 8.9   MG 2.1 2.0   PHOS 4.5 4.9*         ASSESSMENT/PLAN:    67 yo F with WHO I PAH associated with SLE/sjogrens, LIPand prior anorexogen use admitted with acute on chronic right heart failure and volume overload as well as AKI and thrombocytopenia.IRparacentesis on6/10 5.5 Lremoved.    Edema improved but persistent ascites. + C diff infection treatment ongoing.  D improving    -Repeat therapeutic para - plt 46 today. Query IR if adequate for para  - Begin plans to arrange home dopamine @ 2 mcg/kg/min  - Will need tunneled line switched to double lumen - possibly today or tomorrow.  - Lasix 100 mg QID  -Continuemetolazone 5 mg  -Dopamine67mcg/kg/min  -Spironolactoneand replete K  - Continue home sildenafil and flolan at 35 ng/kg/min  - Continue PO vanco(day11) for cdiff  - Continue oxygen suppl  - Fluid restrict to 1.5 L/day  -Completed iron sucrose for iron def anemia. Epogen Qweek.  - Discussed goals of care at length with patient and husband.While they understand that her condition is deteriorating and this is likely terminal progression of her disease they are not yet ready for hospice or palliative care services.       [name] M.D.  Pulmonary and Critical Care Attending  Pager [phone]

## 2017-07-07 NOTE — Progress Notes (Signed)
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    Subjective:  The patient reports her diarrhea is improved today. She denies chest pain, shortness of breath, fevers, chills, or vomiting.     ROS reviewed and unchanged    I/O:  Intake/Output       07/06/17 0600 - 07/07/17 0559 07/07/17 0600 - 07/08/17 0559      [id number] 5409-8119 Total 0600-1759 1478-2956 Total       Intake    P.O.  740  740 1480  220  -- 220    I.V.  100  140.7 240.7  121.8  -- 121.8    Total Intake 840 880.7 1720.7 341.8 -- 341.8       Output    Urine  900  800 1700  1150  -- 1150    Stool  --  -- --  2  -- 2    Total Output [phone] 1152 -- 1152       Net I/O     -60 80.7 20.7 -810.2 -- -810.2          PE:  Temperature:  [97.9 F (36.6 C)-98.5 F (36.9 C)] 98.4 F (36.9 C) (06/25 1509)  Blood pressure (BP): (106-133)/(57-70) 114/63 (06/25 1509)  Heart Rate:  [97-105] 97 (06/25 1509)  Respirations:  [18-20] 19 (06/25 1509)  Pain Score: 0 (06/25 1509)  O2 Device: Nasal cannula (06/25 1509)  O2 Flow Rate (L/min):  [3 l/min] 3 l/min (06/25 1509)  SpO2:  [97 %-99 %] 99 % (06/25 1509)  Physical Exam   Constitutional: No distress.   HENT:   Head: Normocephalic and atraumatic.   Eyes: Right eye exhibits no discharge. Left eye exhibits no discharge.   Neck: Normal range of motion. Neck supple. JVD (v-wave) present.   Cardiovascular: Normal rate.   Murmur heard.  Pulmonary/Chest: Effort normal. No respiratory distress. She has no wheezes. She has rales.   Abdominal: Soft. She exhibits distension. There is no tenderness.   Musculoskeletal: She exhibits edema (+1-2 bilateral lower extremity edema).   Neurological: She is alert.     Skin: Skin is warm and dry. She is not diaphoretic.       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 Now with Clostridium difficile. Has developed pancytopenia.    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 - patient with end-stage PAH secondary to  anroexigen use and connective tissue disease  -currently not on immunosuppression   -continue lasix 100mg  4x/day and metolazone 5mg  dailyuntil diarrhea improving  -diuresis slowing down some  -continue aggressive potassium replacement  -will plan for paracentesis and change to double lumen tunneled line today or tomorrow with IR  -continue flolan, sildenafil and dopamine    2) SLE/Sjogren's with LIP - currently not on immunosuppression   -has been seen by rheum in the past  -anti-DS DNA positive    3) C. Diff colitis - continue po vanc(day11), will extend for 14 day course given symptoms persisting  -diarrhea slowly improving though still symptomatic    4) Hypokalemia - replete aggressively, continue daily BMPs    5) Hypoxemic respiratory failure - continue oxygen, diurese as able    6) afib - continue low dose dopamine  -rate controlled currently    7) Thrombocytopenia - may be related to c. Diff vs autoimmune hemolysis  -will trend for now, slowly improving    8) CKD - likely in part secondary to heart disease  -improving some with diuresis, suggesting  cardiorenal    9) Hyponatremia - likely due to heart failure/volume overload    Patient seen and discussed with attending, Dr. [name].    [name], MD, MPH  Pulmonary/Critical Care Fellow  Pager # [phone]

## 2017-07-07 NOTE — Plan of Care (Signed)
Problem: Promotion of Health and Safety  Goal: Promotion of Health and Safety  Description  The patient remains safe, receives appropriate treatment and achieves optimal outcomes (physically, psychosocially, and spiritually) within the limitations of the disease process by discharge.    Information below is the current care plan.  Outcome: Progressing  Flowsheets  Taken 07/06/2017 2049  Patient /Family stated Goal: get better  Taken 07/07/2017 0107  Guidelines: Inpatient Nursing Guidelines  Individualized Interventions/Recommendations #1: Communicate POC and re-inforce importance of maintaining isolation precautions with family member  Individualized Interventions/Recommendations #2 (if applicable): Cluster care to promote rest  Individualized Interventions/Recommendations #3 (if applicable): Encourage pt to call before ambulating  Individualized Interventions/Recommendations #4 (if applicable): Encourage pt to verbalize feelings about hospitalization to aid with coping  Outcome Evaluation (rationale for progressing/not progressing) every shift: Pt and family verbalizes understanding of POC and importance of maintaining isolation precautions.Pt resting comfortably in bed. participating in care and ambulates in room with 1 person assist while awake. Pt expressing feelings about hospitalization.

## 2017-07-08 ENCOUNTER — Encounter (HOSPITAL_COMMUNITY)
Admission: EM | Disposition: A | Discharge status: Home Health | Payer: Self-pay | Source: Emergency Department | Attending: Pulmonary Medicine

## 2017-07-08 ENCOUNTER — Inpatient Hospital Stay (HOSPITAL_BASED_OUTPATIENT_CLINIC_OR_DEPARTMENT_OTHER): Payer: PPO

## 2017-07-08 LAB — CBC WITH DIFF, BLOOD
ANC-Automated: 2.3 10*3/uL (ref 1.6–7.0)
Abs Basophils: 0 10*3/uL (ref <0.1)
Abs Eosinophils: 0 10*3/uL (ref 0.1–0.5)
Abs Lymphs: 0.9 10*3/uL (ref 0.8–3.1)
Abs Monos: 0.3 10*3/uL (ref 0.2–0.8)
Basophils: 1 %
Eosinophils: 0 %
Hct: 25 % — ABNORMAL LOW (ref 34.0–45.0)
Hgb: 7.9 gm/dL — ABNORMAL LOW (ref 11.2–15.7)
Imm Gran %: 3 % — ABNORMAL HIGH (ref <1)
Imm Gran Abs: 0.1 10*3/uL (ref <0.1)
Lymphocytes: 26 %
MCH: 26 pg (ref 26.0–32.0)
MCHC: 31.6 g/dL — ABNORMAL LOW (ref 32.0–36.0)
MCV: 82.2 um3 (ref 79.0–95.0)
Monocytes: 7 %
Plt Count: 48 10*3/uL — ABNORMAL LOW (ref 140–370)
RBC: 3.04 10*6/uL — ABNORMAL LOW (ref 3.90–5.20)
RDW: 19.4 % — ABNORMAL HIGH (ref 12.0–14.0)
Segs: 64 %
WBC: 3.7 10*3/uL — ABNORMAL LOW (ref 4.0–10.0)

## 2017-07-08 LAB — BASIC METABOLIC PANEL, BLOOD
Anion Gap: 16 mmol/L — ABNORMAL HIGH (ref 7–15)
BUN: 125 mg/dL — ABNORMAL HIGH (ref 8–23)
Bicarbonate: 20 mmol/L — ABNORMAL LOW (ref 22–29)
Calcium: 8.7 mg/dL (ref 8.5–10.6)
Chloride: 94 mmol/L — ABNORMAL LOW (ref 98–107)
Creatinine: 2.07 mg/dL — ABNORMAL HIGH (ref 0.51–0.95)
GFR: 24 mL/min
Glucose: 167 mg/dL — ABNORMAL HIGH (ref 70–99)
Potassium: 3.6 mmol/L (ref 3.5–5.1)
Sodium: 130 mmol/L — ABNORMAL LOW (ref 136–145)

## 2017-07-08 LAB — MAGNESIUM, BLOOD: Magnesium: 1.9 mg/dL (ref 1.6–2.4)

## 2017-07-08 SURGERY — IR PARACENTESIS

## 2017-07-08 NOTE — Plan of Care (Signed)
Problem: Promotion of Health and Safety  Goal: Promotion of Health and Safety  Description  The patient remains safe, receives appropriate treatment and achieves optimal outcomes (physically, psychosocially, and spiritually) within the limitations of the disease process by discharge.    Information below is the current care plan.  Outcome: Progressing  Flowsheets  Taken 07/07/2017 2022 by Michele Rockers, RN  Patient /Family stated Goal: get better  Taken 07/07/2017 0107 by Dorothyann Gibbs, RN  Guidelines: Inpatient Nursing Guidelines  Taken 07/07/2017 1039 by Bosie Helper, RN  Individualized Interventions/Recommendations #1: encouraged pt to verbalized concerns and questions regarding her plan of care  Individualized Interventions/Recommendations #2 (if applicable): encouraged to walk around unit, assist to ambulate if needed  Individualized Interventions/Recommendations #3 (if applicable): continue to observed strict isolation precaution, remind pt and pt's family the importance of frequent hand washing  Individualized Interventions/Recommendations #4 (if applicable): continue to monitor pt's skin integrity, remind pt to frequently change position while sitting or in bed  Outcome Evaluation (rationale for progressing/not progressing) every shift: pt able to verbalized concerns and questions regarding her plan of care and the importance of hand washing. pt's family/son observed to be very supportive and helpful with care. pt's VSS tolerating simple activities on 2 L NC needing minimal assist for her IV lines. pt denies acute SOB. pt's skin condition remains the same  Taken 07/08/2017 0105 by Michele Rockers, RN  Individualized Interventions/Recommendations #5 (if applicable): MOnitor pt's vital signs. Monitor pt's Flolan Dose and Dopamine dose- monitor lines and connections.

## 2017-07-08 NOTE — Plan of Care (Signed)
Problem: Promotion of Health and Safety  Goal: Promotion of Health and Safety  Description  The patient remains safe, receives appropriate treatment and achieves optimal outcomes (physically, psychosocially, and spiritually) within the limitations of the disease process by discharge.    Information below is the current care plan.  Outcome: Progressing  Flowsheets  Taken 07/08/2017 1116 by Darcus Austin, RN  Guidelines: Inpatient Nursing Guidelines  Individualized Interventions/Recommendations #1: Monitor patient's hemodynamics  Individualized Interventions/Recommendations #2 (if applicable): Maintain patient's dobutamine + flolan gtts  Individualized Interventions/Recommendations #3 (if applicable): Continue to update patient's family on plan of care - pt is scheduled for paracentesis + tunneled catheter replacement today  Taken 07/07/2017 1039 by Bosie Helper, RN  Individualized Interventions/Recommendations #4 (if applicable): continue to monitor pt's skin integrity, remind pt to frequently change position while sitting or in bed

## 2017-07-08 NOTE — Interdisciplinary (Signed)
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    Interval History: The patient reports her diarrhea is improved today. She denies chest pain, shortness of breath, fevers, chills, or vomiting.     Anthropometry:   Height: 5' 2"  (157.5 cm)   Weight: 57.3 kg (126 lb 5.2 oz)  Today via standing scale    Wt Trend: Wt trending down ~3kg past 2 weeks however changes likely due to fluid shifts, now net -7,171 since admit  Usual Body Wt:  At time of DTR IS 05/18/17 pt reported UBW 135-139lbs. Estimated dry weight ~129lbs per nephrology notes (possibly lower though).   Will continue to base calculations on59kg    Nutrition-Focused Physical Exam: (07/01/17) - large ascites evident.       Subcutaneous fat loss: Orbital (Mild), Triceps/biceps (Severe), Thoracic/lumbar (moderate).        Muscle loss: Temple (moderate), Clavicle (moderate), Acromion process (moderate), Scapula (moderate), Interosseous (Severe), Patellar (Severe/Moderate), Quadriceps (Severe/Moderate), Calves (UTA d/t edema).        Potential micronutrient deficiencies: Multiple bruises/skin discoloring noted on arms - pt reported d/t pancytopenia. Hair appeared thin/brittle.    Biochemistry:  Pertinent Labs:? Na 130, no fluid restriction, being diuresed.  GFR 24  Recent Labs     07/06/17  0530 07/07/17  0400 07/07/17  0850 07/08/17  0546 07/08/17  1000   NA 129*  --  130*  --  130*   K 3.7  --  3.4*  --  3.6   CL 91*  --  93*  --  94*   BICARB 22  --  20*  --  20*   BUN 126*  --  123*  --  125*   CREAT 1.70*  --  2.17*  --  2.07*   GLU 105*  --  138*  --  167*   [place] 8.7  --  8.9  --  8.7   MG 2.1  --  2.0  --  1.9   PHOS 4.5  --   4.9*  --   --    WBC 4.1 3.9*  --  3.7*  --        No results for input(s): GLUCPOCT in the last 72 hours.    Clinical:  Pertinent meds: reviewed  IV:    DOPamine 2 mcg/kg/min (07/08/17 0953)    epoprostenol (FLOLAN) infusion 35 ng/kg/min (07/08/17 1423)    sodium chloride       Scheduled:    epoetin alfa-epbx  4,000 Units Once per day on Fri    furosemide  100 mg 4x Daily    lansoprazole  30 mg QAM AC    medroxyPROGESTERone  1.25 mg Q24H NR    melatonin  5 mg HS    metolazone  5 mg Daily    Prostacyclin Cassette Change   Daily    Prostacyclin Tubing   Once per day on Mon Wed Fri    Prostacylin Batteries   Once per day on Mon    saccharomyces boulardii  250 mg BID    sildenafil  80 mg TID    sodium chloride (PF)  3 mL Q8H    spironolactone  75 mg BID    thiamine  100 mg Daily    vancomycin  125 mg 4x Daily    vitamin B complex-vitamin C-folic acid  1 capsule Daily    vitamin b-12  50 mcg Daily     Fluid: 06/25 0600 - 06/26 0559  In: 937.4 [P.O.:760; I.V.:177.4]  Out: 2280 [Urine:2275]  Hydration status: No maintenance IV fluids, fluid needs to be met by PO route  G.I.: LBM x 3 today, CDiff.  Bowels improving, on probiotic.  Occasional nausea, treated with prn medications  Edema: +1 as per nursing head to toe assessment  Skin: No PU as per nursing head to toe assessment    Diet:  Estimated Nutrition Needs per Mifflin equation (58.9kg): 1079kcal x 1.4 - 1.7 = 1511 - 1834kcal/day (26 - 31kcal/kg). 47 - 59g/day protein (0.8 - 1.0g/kg - monitor renal fx) Maintenance fluids: Deferred to MD      Diet:Nutritional Supplement Nepro or Novasource Renal; Cafe Mocha (Ne); Deliver Supplements: BID Prefers mocha ONLY. Thank you  Diet NPO; Yes; Patient NPO for procedure; tunneled line placement  PO Intake: Appetite improving.  Eating 3 small - medium sized meals.  Cereal, fruit, yoghurt at breakfast, 1/2 sandwich, soup and salad and dessert at lunch.  On a renal diet, 60g protein restriction, K/PO4/Na  restriction.  Follows low Na diet at home.  Eating well in hospital, not had nocasource past 3 days as mas made pt feel nauseated.  Had many questions regarding diet for discharge, incl K and protein restriction.    Nutrition Diagnosis:  Severe malnutrition r/t chronic illness AEB severe/moderate muscle/fat wasting noted per NFPE, severe fluid accumulations and hx of significant wt loss in the past year d/t a mix of fluid and true wt loss from medical complications    Intervention:   GOAL: (6/19) meet >80% of estimated needs w/ acceptable tolerance - met  Goal (6/26): To receive >80% of nutrition needs with acceptable tolerance    PLAN / RECOMMENDATIONS  - REC advance diet per MD with aim for renal 60gm pro, 2gmNa, 1g phos w/ NO potassium restriction given frequent need for potassium replacements.   - Reduce Novasource Renal (Cafe Mocha) to daily for increased kcal/pro intake  - Discussed need for 60g protein restriction, protein foods and how to monitor / count protein intake   *All patient questions answered  - Recommend pt sees RD briefly prior to discharge to ask any further questions  - Continue daily nephrocaps and 111m thiamine d/t severe malnutrition dx.  - Continue probiotics for diarrhea w/ C. Diff.   - Consider phos binders w/ meals if persistent hyperphosphatemia.  - Trend weight daily.    Monitoring / Evaluation:  Discharge: pending clinical course   Education: Maintaining 60g protein intake discussed 6/26.  During precious visit, educated on CKD diet recommendations and increasing fiber intake at this time given renal fx/C. Diff.   RD/DTR to monitor/evaluate: labs, wt trend, and po/nutrition support status, and S/S of new skin concerns.   Plan and recommendations discussed with care team.  Will continue to follow patient per approved [place] Nutrition Prioritization Schedule guidelines. Nutrition Services remains available via ICeresshould patient medical status change.    LRockwell GermanyRD  MND CSpringfieldPager #[phone]

## 2017-07-08 NOTE — Progress Notes (Signed)
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    Subjective:  The patient reports continued loose stools but denies chest pain, fevers, chills, nausea, or vomiting. She denies lightheadedness.     ROS reviewed and unchanged    I/O:  Intake/Output       07/07/17 0600 - 07/08/17 0559 07/08/17 0600 - 07/09/17 0559      [id number] [id number] Total 0600-1759 5784-6962 Total       Intake    P.O.  440  320 760  440  300 740    I.V.  167.4  10 177.4  175  10 185    Total Intake 607.4 330 937.4 615 310 925       Output    Urine  1150  1125 2275  950  300 1250    Stool  5  -- 5  --  -- --    Total Output 1155 1125 2280 [phone]       Net I/O     -547.6 -795 -1342.6 -335 10 -325          PE:  Temperature:  [97.9 F (36.6 C)-98.3 F (36.8 C)] 98.2 F (36.8 C) (06/26 1554)  Blood pressure (BP): (113-123)/(63-75) 113/65 (06/26 2054)  Heart Rate:  [99-106] 102 (06/26 1554)  Respirations:  [18-20] 20 (06/26 1554)  Pain Score: Anticipated Pain (06/26 2054)  O2 Device: Nasal cannula (06/26 0840)  O2 Flow Rate (L/min):  [3 l/min] 3 l/min (06/26 0840)  SpO2:  [95 %-100 %] 100 % (06/26 1554)  Physical Exam   Constitutional: No distress.   HENT:   Head: Normocephalic and atraumatic.   Eyes: No scleral icterus.   Neck: JVD present.   Cardiovascular: Normal rate.   Murmur heard.  Pulmonary/Chest: Effort normal. No respiratory distress. She has no wheezes. She has rales.   Abdominal: Soft. She exhibits distension. There is no tenderness.   Musculoskeletal: She exhibits edema.   Neurological: She is alert.   Skin: Skin is warm and dry. She is not diaphoretic.       Current Labs  Recent Labs     07/08/17  0546      WBC 3.7*   RBC 3.04*   HGB 7.9*   HCT 25.0*   MCV 82.2   MCHC 31.6*   RDW 19.4*   PLT 48*     Recent Labs     07/08/17  1000   NA 130*   K 3.6   CL 94*   BICARB 20*   BUN 125*   CREAT 2.07*   GLU 167*   [place] 8.7       Micro:  Microbiology Results (last 30 days)     Procedure Component Value - Date/Time    C. difficile Toxin PCR, Stool Liquid Stool in Sterile Container [[id number]]  (Abnormal) Collected:  06/26/17 1437    Lab Status:  Edited Specimen:  Stool Updated:  06/26/17 2229     C.Difficile Toxin, PCR DETECTED     Comment: Clostridium difficile toxin B gene (tcdB) DETECTED.  Presence of the tcdB gene correlates highly with   toxigenicity in C.difficile.  Corrected result; previously reported as Not DetectedClostridium difficile   toxin B  gene (tcdB) NOT DETECTED. on 06/26/2017 at 22:24   ....................................................  This FDA-cleared Real Time PCR in vitro diagnostic  test has been modified and its performance   characteristics have been validated by    [facility].                                                     .  Submission of more than one specimen within 7 days  is not recommended as repeat molecular testing  is not appropriate for confirmation or test of cure.                                                     .  Expected result: Not Detected         Narrative:       If specimen not collected by 06/28/2017, contact physician to  reassess need for collection. 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 Now with Clostridium  difficile. Has developed pancytopenia.    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 Diff colitis - continue po vanc(day12), will extend for 14 day course given symptoms persisting  -diarrhea slowly improvingthough still symptomatic    4) Hypokalemia - replete aggressively,continue daily BMPs    5) Hypoxemic respiratory failure - continue oxygen, diurese as able    6) afib - continue low dose dopamine  -rate controlled currently    7) Thrombocytopenia - may be related to c. Diff vs autoimmune hemolysis  -will trend for now, slowly improving    8) CKD - likely in part secondary to heart disease  -improving some with diuresis, suggesting cardiorenal    9) Hyponatremia - likely due to heart failure/volume overload  -trend BMP    Patient seen and discussed with attending, Dr. [name].      [name], MD, MPH  Pulmonary/Critical Care Fellow  Pager # [phone]    Pulmonary Vascular Attending Addendum:  Patient was examined with Dr. [place], imaging and tests reviewed.  Please see above note for full details and plan.      67 yo F with WHO  I PAH associated with SLE/sjogrens, LIPand prior anorexogen use admitted with acute on chronic right heart failure and volume overload as well as AKI and thrombocytopenia.IRparacentesis on6/10 5.5 Lremoved.    Edemaimproved but persistentascites. + C diff infection  treatment ongoing.  D improving    -Repeat therapeutic para - plt 48 today.  - Begin plans to arrange home dopamine @ 2 mcg/kg/min  - IR to switch single lumen tunneled line to double lumen  - Lasix 100 mg QID.  Will switch to PO after paracentesis  -Continuemetolazone 5 mg  -Dopamine62mcg/kg/min  -Spironolactoneand replete K  - Continue home sildenafil and flolan at 35 ng/kg/min  - Continue PO vanco(day12) for c.diff  - Continue oxygen suppl  - Fluid restrict to 1.5 L/day  -Completed iron sucrose for iron def anemia. Epogen Qweek.  -Discussed goals of care at length with patient and husband.While they understand that her condition is deteriorating and this is likely terminal progression of her disease they are not yet ready for hospice or palliative care services.     [name] M.D.  Pulmonary and Critical Care Attending  Pager [phone]

## 2017-07-09 ENCOUNTER — Encounter (HOSPITAL_COMMUNITY)
Admission: EM | Disposition: A | Discharge status: Home Health | Payer: Self-pay | Source: Emergency Department | Attending: Pulmonary Medicine

## 2017-07-09 ENCOUNTER — Inpatient Hospital Stay (HOSPITAL_COMMUNITY): Payer: PPO

## 2017-07-09 DIAGNOSIS — I272 Pulmonary hypertension, unspecified: Secondary | ICD-10-CM

## 2017-07-09 LAB — CBC WITH DIFF, BLOOD
ANC-Automated: 2.3 10*3/uL (ref 1.6–7.0)
Abs Basophils: 0 10*3/uL (ref <0.1)
Abs Eosinophils: 0 10*3/uL (ref 0.1–0.5)
Abs Lymphs: 0.9 10*3/uL (ref 0.8–3.1)
Abs Monos: 0.2 10*3/uL (ref 0.2–0.8)
Basophils: 1 %
Eosinophils: 0 %
Hct: 25.7 % — ABNORMAL LOW (ref 34.0–45.0)
Hgb: 8.3 gm/dL — ABNORMAL LOW (ref 11.2–15.7)
Imm Gran %: 2 % — ABNORMAL HIGH (ref <1)
Imm Gran Abs: 0.1 10*3/uL (ref <0.1)
Lymphocytes: 26 %
MCH: 26.3 pg (ref 26.0–32.0)
MCHC: 32.3 g/dL (ref 32.0–36.0)
MCV: 81.3 um3 (ref 79.0–95.0)
Monocytes: 6 %
Plt Count: 50 10*3/uL — ABNORMAL LOW (ref 140–370)
RBC: 3.16 10*6/uL — ABNORMAL LOW (ref 3.90–5.20)
RDW: 19.3 % — ABNORMAL HIGH (ref 12.0–14.0)
Segs: 65 %
WBC: 3.4 10*3/uL — ABNORMAL LOW (ref 4.0–10.0)

## 2017-07-09 LAB — PHOSPHORUS, BLOOD
Phosphorous: 5 mg/dL — ABNORMAL HIGH (ref 2.7–4.5)
Phosphorous: 4.7 mg/dL — ABNORMAL HIGH (ref 2.7–4.5)

## 2017-07-09 LAB — ALBUMIN, OTHER: Albumin, Other: 2.5 g/dL

## 2017-07-09 LAB — BASIC METABOLIC PANEL, BLOOD
Anion Gap: 18 mmol/L — ABNORMAL HIGH (ref 7–15)
Anion Gap: 17 mmol/L — ABNORMAL HIGH (ref 7–15)
BUN: 116 mg/dL — ABNORMAL HIGH (ref 8–23)
BUN: 116 mg/dL — ABNORMAL HIGH (ref 8–23)
Bicarbonate: 21 mmol/L — ABNORMAL LOW (ref 22–29)
Bicarbonate: 21 mmol/L — ABNORMAL LOW (ref 22–29)
Calcium: 8.9 mg/dL (ref 8.5–10.6)
Calcium: 9 mg/dL (ref 8.5–10.6)
Chloride: 92 mmol/L — ABNORMAL LOW (ref 98–107)
Chloride: 94 mmol/L — ABNORMAL LOW (ref 98–107)
Creatinine: 1.76 mg/dL — ABNORMAL HIGH (ref 0.51–0.95)
Creatinine: 1.93 mg/dL — ABNORMAL HIGH (ref 0.51–0.95)
GFR: 29 mL/min
GFR: 26 mL/min
Glucose: 103 mg/dL — ABNORMAL HIGH (ref 70–99)
Glucose: 119 mg/dL — ABNORMAL HIGH (ref 70–99)
Potassium: 3.2 mmol/L — ABNORMAL LOW (ref 3.5–5.1)
Potassium: 4 mmol/L (ref 3.5–5.1)
Sodium: 131 mmol/L — ABNORMAL LOW (ref 136–145)
Sodium: 132 mmol/L — ABNORMAL LOW (ref 136–145)

## 2017-07-09 LAB — MAGNESIUM, BLOOD
Magnesium: 2 mg/dL (ref 1.6–2.4)
Magnesium: 2 mg/dL (ref 1.6–2.4)

## 2017-07-09 LAB — BODY FLUID CELL CT / DIFF
Fluid RBC mm3: 226000 uL
Fluid WBC mm3: 586 uL
Lymphocytes fluid %: 30 %
Macrophages fluid %: 63 %
Mesothelial Cells Fluid %: 5 %
Neutrophils Fluid %: 2 %

## 2017-07-09 LAB — TOTAL PROTEIN, OTHER: Total Protein, Other: 4.5 g/dL

## 2017-07-09 SURGERY — IR PLCMT TUNNELED CATH

## 2017-07-09 MED ORDER — FUROSEMIDE 20 MG OR TABS
100.00 mg | ORAL_TABLET | Freq: Four times a day (QID) | ORAL | Status: DC
Start: 2017-07-09 — End: 2017-07-12
  Administered 2017-07-09 – 2017-07-12 (×11): 100 mg via ORAL
  Filled 2017-07-09 (×11): qty 1

## 2017-07-09 MED ORDER — LIDOCAINE HCL 1 % IJ SOLN
INTRAMUSCULAR | Status: DC | PRN
Start: 2017-07-09 — End: 2017-07-09
  Administered 2017-07-09 (×3): 3 mL via INTRADERMAL
  Administered 2017-07-09: 10 mL via INTRADERMAL

## 2017-07-09 MED ORDER — MIDAZOLAM HCL 2 MG/2ML IJ SOLN
INTRAMUSCULAR | Status: DC | PRN
Start: 2017-07-09 — End: 2017-07-09
  Administered 2017-07-09 (×3): 1 mg via INTRAVENOUS

## 2017-07-09 MED ORDER — POTASSIUM CHLORIDE CRYS CR 20 MEQ OR TBCR
40.00 meq | EXTENDED_RELEASE_TABLET | Freq: Once | ORAL | Status: AC
Start: 2017-07-09 — End: 2017-07-09
  Administered 2017-07-09: 40 meq via ORAL
  Filled 2017-07-09: qty 2

## 2017-07-09 MED ORDER — LIDOCAINE HCL 1 % IJ SOLN
INTRAMUSCULAR | Status: DC | PRN
Start: 2017-07-09 — End: 2017-07-09
  Administered 2017-07-09: 10 mL via INTRADERMAL

## 2017-07-09 MED ORDER — FENTANYL CITRATE (PF) 100 MCG/2ML IJ SOLN
INTRAMUSCULAR | Status: DC | PRN
Start: 2017-07-09 — End: 2017-07-09
  Administered 2017-07-09 (×3): 50 ug via INTRAVENOUS

## 2017-07-09 SURGICAL SUPPLY — 16 items
CATHETER PICC POWERLINE SURECUFF 6FR, DUAL LUMEN (Tunneled or Long-term catheters) ×2 IMPLANT
CATHETER TORCON NB ADV BEACON KUMPE 5FR X 40CM (Procedural wires/sheaths/catheters/balloons/dilators) ×2 IMPLANT
COVER PROBE MICROTEK INTRAOPERATIVE 5" X 96" PC1308 (Drapes/towels) ×4 IMPLANT
DILATOR AQ HYDROPHILIC 6FR X 20CM (Procedural wires/sheaths/catheters/balloons/dilators) ×2 IMPLANT
DILATOR AQ HYDROPHILIC 7FR X 20CM (Procedural wires/sheaths/catheters/balloons/dilators) ×2 IMPLANT
DRESSING HEMOSTATIC QUIKCLOT 2" X 2" (Dressings/packing) ×4 IMPLANT
FLOWSWITCH TIP HP (Misc Surgical Supply) ×2
GUIDEWIRE GLIDEWIRE ANGLED .035" X 150CM (Procedural wires/sheaths/catheters/balloons/dilators) ×2 IMPLANT
GUIDEWIRE NITREX .018" X 180CM, INTRMDT, 5CM 15° ANG TIP (Procedural wires/sheaths/catheters/balloons/dilators) ×2 IMPLANT
INTRODUCER SHEATH PRELUDE 5FR X 11CM GREY (Procedural wires/sheaths/catheters/balloons/dilators) ×4 IMPLANT
PROCEDURE PACK - IR NON-VASCULAR (Procedure Packs/kits) ×2 IMPLANT
SET MICROPUNCTURE 5FR X 10CM, TRANSITIONLESS G43870 (Procedural wires/sheaths/catheters/balloons/dilators) ×4 IMPLANT
SHEATH PEEL AWAY 50CM X 7FR  (5/BX) (Procedural wires/sheaths/catheters/balloons/dilators) ×2 IMPLANT
TOWELS OR BLUE 4-PACK STERILE, DISPOSABLE (Drapes/towels) ×2 IMPLANT
TRAY THORACENTESIS (Kits/Sets/Trays) ×2
WIRE GLIDE ADVANTAGE 0.035" X 180CM, ANGLE (Procedural wires/sheaths/catheters/balloons/dilators) ×2 IMPLANT

## 2017-07-09 NOTE — RN OR/Procedure Note (Addendum)
Patients tunneled line unable to be removed, due to scarring and the fragility of the line , which had been in place for 14 years, patient updated by MD what had happened and plan of care discussed, patients questions and concerns addressed, see MD note

## 2017-07-09 NOTE — Progress Notes (Signed)
Pulmonary Vascular Progress Note:     S:  - tunnled cath placed today    Vitals:    Latest Entry  Range (last 24 hours)    Temperature: 98.2 F (36.8 C)  Temp  Avg: 98.2 F (36.8 C)  Min: 98.1 F (36.7 C)  Max: 98.2 F (36.8 C)    Blood pressure (BP): 129/79  BP  Min: 107/48  Max: 132/75    Heart Rate: 104  Pulse  Avg: 103.3  Min: 98  Max: 110    Respirations: 16  Resp  Avg: 17.3  Min: 16  Max: 20    SpO2: 97 %  SpO2  Avg: 97.6 %  Min: 95 %  Max: 100 %       No data recorded          Intake/Output Summary (Last 24 hours) at 07/09/2017 1137  Last data filed at 07/09/2017 1100  Gross per 24 hour   Intake 886.42 ml   Output 2351 ml   Net -1464.58 ml       Physical Exam  Gen: NAD  HEENT: EOMI, PERRLA  NECK: Supple, no cervical LAD  CV: RRR, no MRCG  RESP: good effort. +ve rales  ABD: soft, NT/ND, NABS  EXT: ++ LE edema  NEURO: non-focal, grossly intact    Labs  CBC  Recent Labs     07/08/17  0546 07/09/17  0536   WBC 3.7* 3.4*   HGB 7.9* 8.3*   HCT 25.0* 25.7*   PLT 48* 50*   SEG 64 65   LYMPHS 26 26   MONOS 7 6      Chemistry  Recent Labs     07/07/17  0850 07/08/17  1000 07/09/17  0536   NA 130* 130* 131*   K 3.4* 3.6 3.2*   CL 93* 94* 92*   BICARB 20* 20* 21*   BUN 123* 125* 116*   CREAT 2.17* 2.07* 1.76*   GLU 138* 167* 103*   [place] 8.9 8.7 8.9   MG 2.0 1.9 2.0   PHOS 4.9*  --  5.0*       Medications:  Scheduled Meds   epoetin alfa-epbx  4,000 Units Once per day on Fri    furosemide  100 mg 4x Daily    lansoprazole  30 mg QAM AC    medroxyPROGESTERone  1.25 mg Q24H NR    melatonin  5 mg HS    metolazone  5 mg Daily    Prostacyclin Cassette Change   Daily    Prostacyclin Tubing   Once per day on Mon Wed Fri    Prostacylin Batteries   Once per day on Mon    saccharomyces boulardii  250 mg BID    sildenafil  80 mg TID    sodium chloride (PF)  3 mL Q8H    spironolactone  75 mg BID    thiamine  100 mg Daily    vancomycin  125 mg 4x Daily    vitamin B complex-vitamin C-folic acid  1 capsule Daily     vitamin b-12  50 mcg Daily     PRN Meds   acetaminophen  650 mg Q6H PRN    bisacodyl  10 mg Daily PRN    fentaNYL   Intra-Op PRN    hydrocortisone  1 Application J6E PRN    lidocaine   Intra-Op PRN    midazolam   Intra-Op PRN    nalOXone  0.1 mg  Q2 Min PRN    ondansetron  4 mg Q6H PRN    senna  2 tablet Daily PRN    simethicone  80 mg Q6H PRN    sodium chloride (PF)  10 mL PRN    sodium chloride (PF)  3 mL PRN    sodium chloride   Continuous PRN     IV Meds   DOPamine 2 mcg/kg/min (07/09/17 0715)    epoprostenol (FLOLAN) infusion 35 ng/kg/min (07/09/17 0715)    sodium chloride         Assessment:  Diana Chambers is a 67 year old female with 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 Now with Clostridium difficile. Has developed pancytopenia.    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 Diff colitis - continue po vanc(day12), will extend for 14 day course given symptoms persisting  -diarrhea slowly improvingthough still symptomatic    4) Hypokalemia - replete aggressively,continuedaily BMPs    5) Hypoxemic respiratory failure - continue oxygen, diurese as able    6) afib - continue low dose dopamine  -rate controlled currently    7) Thrombocytopenia - may be related to c. Diff vs autoimmune  hemolysis  -will trend for now,slowly improving    8) CKD - likely in part secondary to heart disease  -improving some with diuresis, suggesting cardiorenal    9) Hyponatremia - likely due to heart failure/volume overload  -trend BMP    Patient seen and discussed with attending, Dr. [name].      Pulmonary Vascular Attending Addendum:  Patient was examined with Dr. Hassie Bruce, imaging and tests reviewed.  Please see above note for full details and plan.      67 yo F with WHO I PAH associated with SLE/sjogrens, LIPand prior anorexogen use admitted with acute on chronic right heart failure and volume overload as well as AKI and thrombocytopenia.IRparacentesis on6/10 5.5 Lremoved.    Edemaimproved but persistentascites. + C diff infectiontreatment ongoing. D improving    -Repeat therapeutic para today.  - Arrange home dopamine @ 2 mcg/kg/min  - IR to switch single lumen tunneled line to double lumen  - Lasix 100 mg QID.  Will switch to PO after paracentesis  -Continuemetolazone 5 mg  -Dopamine1mcg/kg/min  -Spironolactoneand replete K  - Continue home sildenafil and flolan at 35 ng/kg/min  - Continue PO vanco(day13) for c.diff  - Continue oxygen suppl  - Fluid restrict to 1.5 L/day  -Completed iron sucrose for iron def anemia. Epogen Qweek.  -Discussed goals of care at length with patient and husband.While they understand that her condition is deteriorating and this is likely terminal progression of her disease they are not yet ready for hospice or palliative care services.     [name] M.D.  Pulmonary and Critical Care Attending  Pager [phone]

## 2017-07-09 NOTE — Plan of Care (Signed)
Problem: Promotion of Health and Safety  Goal: Promotion of Health and Safety  Description  The patient remains safe, receives appropriate treatment and achieves optimal outcomes (physically, psychosocially, and spiritually) within the limitations of the disease process by discharge.    Information below is the current care plan.  Outcome: Progressing  Flowsheets  Taken 07/08/2017 2000 by Michele Rockers, RN  Patient /Family stated Goal: get better  Taken 07/08/2017 1116 by Darcus Austin, RN  Guidelines: Inpatient Nursing Guidelines  Taken 07/09/2017 0353 by Michele Rockers, RN  Individualized Interventions/Recommendations #1: MOnitor pt's vital signs, intakes and output. Notifiy MD on call of any changes in pt's condition.  Individualized Interventions/Recommendations #2 (if applicable): Cluster pt care to promote rest and sleep. Instruct pt and family to call for assistance whenever help is needed. Eduate pt on safety precautions and fall preventions measures.  Individualized Interventions/Recommendations #3 (if applicable): Instruct pt that she is NPO after midnight in preparation for her am procedure: paracentesis and tunnel catheter replacement.  Outcome Evaluation (rationale for progressing/not progressing) every shift: Pt was able to sleep off and on during the night. Pt is kept NPO after midnight. Pt and family participates in her plans of care. Pt still on Flolan and Dopamine drip. Will conitnue plan of care.  Taken 07/07/2017 1039 by Bosie Helper, RN  Individualized Interventions/Recommendations #4 (if applicable): continue to monitor pt's skin integrity, remind pt to frequently change position while sitting or in bed  Taken 07/08/2017 0105 by Michele Rockers, RN  Individualized Interventions/Recommendations #5 (if applicable): MOnitor pt's vital signs. Monitor pt's Flolan Dose and Dopamine dose- monitor lines and connections.

## 2017-07-09 NOTE — RN OR/Procedure Note (Signed)
Band-aid and gauze placed on Lt abd.  No signs of active bleeding. Skin clean, dry and intact.

## 2017-07-09 NOTE — Plan of Care (Signed)
MD was paged this am  For CBC order.  no new order for CBC obtained.  CBC was ordered by nurse- pt and pt's husband was asking about the Platelet level this am. Getting anxious about the level of platelet. Pt's  Husband verbalized " it is very important to know."

## 2017-07-09 NOTE — Interdisciplinary (Signed)
07/09/17 1613   Assessment   Assessment Type Progress/Follow-up   Referral Information   Referral Type Community Resources/Referrals   Plans/Interventions/Discharge   Anticipated Discharge Destination Home      SW responding to page from CM about pt son requesting assistance with FMLA forms    I met with pt at bed side; pt is lying in bed with O2 in place and her son, Karn Pickler, is at bed side.   [name] shared that his initial FMLA forms expire on July 1st and he would like to have it extended till July 8th; he requested a letter with [place] letterhead signed by Dr. [name] who is the attending MD who signed the originally FMLA forms.     I f/u with pt about the Advanced Directives form and pt expressed interest in reviewing a blank form which was discussed in detail and pt was made aware that [employer] can offer a notary should she decide to fill it out and notarize it.     Pt son was also offered food vouchers and information to apply for a parking pass which he gladly accepted as he plans to stay for another week.     Plan  - Nutrition was called at [phone] and food vouchers were ordered  - 1st call was paged and a letter to extend pt son's FMLA was requested  - pt son provided with parking pass information  - pt and son provided with SW contact information  - SW will remain available as needed    Beverely Risen, MSW  Ph: [phone]  Pager: [phone]

## 2017-07-10 ENCOUNTER — Encounter (HOSPITAL_COMMUNITY): Payer: Self-pay | Admitting: Pulmonary Medicine

## 2017-07-10 LAB — MDIFF
Number of Cells Counted: 112
Plt Est: DECREASED

## 2017-07-10 LAB — CBC WITH DIFF, BLOOD
ANC-Manual Mode: 3.3 10*3/uL (ref 1.6–7.0)
Abs Basophils: 0.1 10*3/uL (ref <0.1)
Abs Eosinophils: 0 10*3/uL (ref 0.1–0.5)
Abs Lymphs: 0.8 10*3/uL (ref 0.8–3.1)
Abs Monos: 0.1 10*3/uL — ABNORMAL LOW (ref 0.2–0.8)
Basophils: 2 %
Eosinophils: 0 %
Hct: 26.7 % — ABNORMAL LOW (ref 34.0–45.0)
Hgb: 8.7 gm/dL — ABNORMAL LOW (ref 11.2–15.7)
Lymphocytes: 18 %
MCH: 26.4 pg (ref 26.0–32.0)
MCHC: 32.6 g/dL (ref 32.0–36.0)
MCV: 81.2 um3 (ref 79.0–95.0)
Monocytes: 3 %
Plt Count: 52 10*3/uL — ABNORMAL LOW (ref 140–370)
RBC: 3.29 10*6/uL — ABNORMAL LOW (ref 3.90–5.20)
RDW: 19.6 % — ABNORMAL HIGH (ref 12.0–14.0)
Segs: 77 %
WBC: 4.3 10*3/uL (ref 4.0–10.0)

## 2017-07-10 LAB — BASIC METABOLIC PANEL, BLOOD
Anion Gap: 17 mmol/L — ABNORMAL HIGH (ref 7–15)
Anion Gap: 17 mmol/L — ABNORMAL HIGH (ref 7–15)
BUN: 115 mg/dL — ABNORMAL HIGH (ref 8–23)
BUN: 117 mg/dL — ABNORMAL HIGH (ref 8–23)
Bicarbonate: 20 mmol/L — ABNORMAL LOW (ref 22–29)
Bicarbonate: 21 mmol/L — ABNORMAL LOW (ref 22–29)
Calcium: 8.9 mg/dL (ref 8.5–10.6)
Calcium: 8.8 mg/dL (ref 8.5–10.6)
Chloride: 94 mmol/L — ABNORMAL LOW (ref 98–107)
Chloride: 94 mmol/L — ABNORMAL LOW (ref 98–107)
Creatinine: 2.23 mg/dL — ABNORMAL HIGH (ref 0.51–0.95)
Creatinine: 2.23 mg/dL — ABNORMAL HIGH (ref 0.51–0.95)
GFR: 22 mL/min
GFR: 22 mL/min
Glucose: 84 mg/dL (ref 70–99)
Glucose: 147 mg/dL — ABNORMAL HIGH (ref 70–99)
Potassium: 3.7 mmol/L (ref 3.5–5.1)
Potassium: 3.6 mmol/L (ref 3.5–5.1)
Sodium: 131 mmol/L — ABNORMAL LOW (ref 136–145)
Sodium: 132 mmol/L — ABNORMAL LOW (ref 136–145)

## 2017-07-10 LAB — MAGNESIUM, BLOOD
Magnesium: 2.1 mg/dL (ref 1.6–2.4)
Magnesium: 2.1 mg/dL (ref 1.6–2.4)

## 2017-07-10 LAB — PHOSPHORUS, BLOOD
Phosphorous: 5.1 mg/dL — ABNORMAL HIGH (ref 2.7–4.5)
Phosphorous: 5.1 mg/dL — ABNORMAL HIGH (ref 2.7–4.5)

## 2017-07-10 MED ORDER — POTASSIUM CHLORIDE 10 MEQ/100ML IV SOLN
10.00 meq | INTRAVENOUS | Status: AC
Start: 2017-07-10 — End: 2017-07-10
  Administered 2017-07-10 (×2): 10 meq via INTRAVENOUS
  Filled 2017-07-10 (×2): qty 100

## 2017-07-10 NOTE — Interdisciplinary (Signed)
PICC dressing unable to be changed during day shift d/t complications with old L chest cath site continuing to ooze throughout the day and frequent dressing changes. PICC dressing change started at 1830 and dressing was saturated with purell for easier removal as pt has very fragile skin. Endorsed to [employer] RN Roselie Awkward that dressing removal has been started and the rest of the dressing change needs to be completed.

## 2017-07-10 NOTE — Progress Notes (Signed)
[facility] 55 Days 17 Hours      Subjective: oozing from LIJ tunneled cath site overnight    Medications:  Scheduled Meds   epoetin alfa-epbx  4,000 Units Once per day on Fri    furosemide  100 mg 4x Daily    lansoprazole  30 mg QAM AC    medroxyPROGESTERone  1.25 mg Q24H NR    melatonin  5 mg HS    metolazone  5 mg Daily    Prostacyclin Cassette Change   Daily    Prostacyclin Tubing   Once per day on Mon Wed Fri    Prostacylin Batteries   Once per day on Mon    saccharomyces boulardii  250 mg BID    sildenafil  80 mg TID    sodium chloride (PF)  3 mL Q8H    spironolactone  75 mg BID    thiamine  100 mg Daily    vancomycin  125 mg 4x Daily    vitamin B complex-vitamin C-folic acid  1 capsule Daily    vitamin b-12  50 mcg Daily     PRN Meds   acetaminophen  650 mg Q6H PRN    bisacodyl  10 mg Daily PRN    hydrocortisone  1 Application T6R PRN    nalOXone  0.1 mg Q2 Min PRN    ondansetron  4 mg Q6H PRN    senna  2 tablet Daily PRN    simethicone  80 mg Q6H PRN    sodium chloride (PF)  10 mL PRN    sodium chloride (PF)  3 mL PRN    sodium chloride   Continuous PRN     IV Meds   DOPamine 2 mcg/kg/min (07/09/17 1700)    epoprostenol (FLOLAN) infusion 35 ng/kg/min (07/10/17 1338)    sodium chloride         Vitals:    Latest Entry  Range (last 24 hours)    Temperature: 98 F (36.7 C)  Temp  Avg: 98 F (36.7 C)  Min: 97.5 F (36.4 C)  Max: 98.4 F (36.9 C)    Blood pressure (BP): 104/68  BP  Min: 103/58  Max: 124/64    Heart Rate: 99  Pulse  Avg: 102.4  Min: 97  Max: 107    Respirations: 19  Resp  Avg: 19.3  Min: 18  Max: 20    SpO2: 99 %  SpO2  Avg: 98.5 %  Min: 97 %  Max: 99 %       No data recorded     Weight: 51 kg (112 lb 7 oz)  Percentage Weight Change (%): -8.81 %      Intake/Output Summary (Last 24 hours) at 07/10/2017 1611  Last data filed at 07/10/2017 1607  Gross per 24 hour   Intake 559.59 ml   Output 1500 ml   Net -940.41 ml              Exam:   Gen: NAD, comfortable in chair  NECK: _ JVd  Chest wall.  Saturated dressing over L tunneled IJ catheter site.  No hematoma.  No active ooze.  CV: RRR = TR no S3  RESP: Rales bases bilat  ABD: soft,  EXT: 1+ edema  NEURO: non-focal, grossly intact    Labs:   CBC  Recent Labs     07/09/17  0536 07/10/17  0450   WBC 3.4* 4.3   HGB 8.3* 8.7*  HCT 25.7* 26.7*   PLT 50* 52*   SEG 65 77   LYMPHS 26 18   MONOS 6 3      Chemistry  Recent Labs     07/09/17  1700 07/10/17  0450   NA 132* 131*   K 4.0 3.7   CL 94* 94*   BICARB 21* 20*   BUN 116* 115*   CREAT 1.93* 2.23*   GLU 119* 84   [place] 9.0 8.9   MG 2.0 2.1   PHOS 4.7* 5.1*         ASSESSMENT/PLAN:    67 yo F with WHO I PAH associated with SLE/sjogrens, LIPand prior anorexogen use admitted with acute on chronic right heart failure and volume overload as well as AKI and thrombocytopenia.IRparacentesis on6/10 5.5 Lremoved.    Edemaimproved but persistentascites. + C diff infectiontreatment ongoing. D improving    -Repeat therapeutic para 6/27 3.5 Liters removed  - Arrange home dopamine @ 2 mcg/kg/min  -IR to palced double lumentunneled line 6/27.  Unable to remove all of the old single lumen line  - Lasix 100 mg PO QID.   -Continuemetolazone 5 mg  -Remove bladder catheter  -Dopamine52mcg/kg/min  -Spironolactoneand replete K  - Continue home sildenafil and flolan at 35 ng/kg/min  - Continue PO vanco(day14) for c.diff  - Continue oxygen suppl  - Fluid restrict to 1.5 L/day  -Completed iron sucrose for iron def anemia. Epogen Qweek.  -Discussed goals of care at length with patient and husband.While they understand that her condition is deteriorating and this is likely terminal progression of her disease they are not yet ready for hospice or palliative care services.     [name] M.D.  Pulmonary and Critical Care Attending  Pager [phone]    [name] M.D.  Pulmonary and Critical Care Attending  Pager [phone]

## 2017-07-10 NOTE — Plan of Care (Signed)
Problem: Promotion of Health and Safety  Goal: Promotion of Health and Safety  Description  The patient remains safe, receives appropriate treatment and achieves optimal outcomes (physically, psychosocially, and spiritually) within the limitations of the disease process by discharge.    Information below is the current care plan.  Outcome: Not Progressing  Flowsheets  Taken 07/10/2017 0800 by Jolee Ewing, RN  Patient /Family stated Goal: stop line cath from bleeding  Taken 07/08/2017 1116 by Darcus Austin, RN  Guidelines: Inpatient Nursing Guidelines  Taken 07/10/2017 1859 by Jolee Ewing, RN  Individualized Interventions/Recommendations #1: Change L chest cath dressing prn for saturation. Notify MD for continued oozing from old cath site. Quick clot obtained from IR for future dressing changes if needed.  Individualized Interventions/Recommendations #2 (if applicable): Ordered obtained for pt to go outside with family off tele.  Individualized Interventions/Recommendations #3 (if applicable): Keep pt and family informed of poc and explain care as given to help to minimize anxiety  Individualized Interventions/Recommendations #4 (if applicable): Take extra care with dressing changes to prevent skin tears as pt skin is extremely fragile  Outcome Evaluation (rationale for progressing/not progressing) every shift: vss throughout shift. L chest old cath site with moderate oozing throughout most of shift. IR came to bedside to hold pressure and place new dressing using quick clot. Drainage has slowed but new dressing may be necessary during noc shift. Quick clot supplies left at bedside for dressing change later if needed. Pt and husband are more relaxed with new dressing, drainage controlled, and IR surgeon came to bedside to talk to pt.

## 2017-07-10 NOTE — Interdisciplinary (Signed)
07/10/17 1711   Follow Up/Progress   Is the Patient Ready for Discharge No   Supplies/Services  Too soon to be determined   Barriers to Discharge Awaiting clinical improvement   Patient/Family/Legal/Surrogate Decision Maker Has Been Given Options And Choice In The Selection of Post-Acute Care Providers Yes   Family/Caregiver's Assessed for Readiness, willingness, and ability to provide or support self-management activities   Respite Care Not Applicable   Patient/Family Are In Agreement With Discharge Plan No   Public Health Clearance Needed Not Applicable   Plan/Interventions Explore needs and options for aftercare, provide referrals;Ongoing weekly therapeutic interventions     Medical necessity Reason for continued Hospital Stay:  acute on chronic right heart failure and volume overload as well as AKI and thrombocytopenia.Persistent edema.  ?  Interventions requiring continued Hospitalization/Plan of Care:  -Cont lasix IVP diuretic4x daily  -Cont dopamine gtt  - Continuous supplemental O2 @2 -3LPMper NC  - Conthomeflolan gtt  ?  Anticipated discharge plan at this time (home, SNF, etc.):  Home  ?  Barriers to Discharge:  None.  ?  Expected DC Date:  TBD.    Plan to continue treatment with diuretics, until patient is adequately diuresed.Pt is currently on service with [facility], for Flolan gtt. Spoke with Caren Griffins, [facility] NP, who has arranged delivery of medication supply of Flolan for pt tomorrow, 07/11/17.  Pt on service with [employer], for Oxygen DME. Pt has a POC.  [facility] to provide home dopamine infusion, upon discharge.  Anticipate DC Monday, 7/1, pending resolution of catheter site oozing. Pt's husband to transport, upon discharge. Pt was given extension work letter for her son, Karn Pickler.

## 2017-07-10 NOTE — Plan of Care (Signed)
Problem: Promotion of Health and Safety  Goal: Promotion of Health and Safety  Description  The patient remains safe, receives appropriate treatment and achieves optimal outcomes (physically, psychosocially, and spiritually) within the limitations of the disease process by discharge.    Information below is the current care plan.  Flowsheets  Taken 07/09/2017 2055 by Keenan Bachelor, RN  Patient /Family stated Goal: Sleep  Taken 07/08/2017 1116 by Darcus Austin, RN  Guidelines: Inpatient Nursing Guidelines  Taken 07/09/2017 0353 by Michele Rockers, RN  Individualized Interventions/Recommendations #1: MOnitor pt's vital signs, intakes and output. Notifiy MD on call of any changes in pt's condition.  Individualized Interventions/Recommendations #2 (if applicable): Cluster pt care to promote rest and sleep. Instruct pt and family to call for assistance whenever help is needed. Eduate pt on safety precautions and fall preventions measures.  Taken 07/07/2017 1039 by Bosie Helper, RN  Individualized Interventions/Recommendations #4 (if applicable): continue to monitor pt's skin integrity, remind pt to frequently change position while sitting or in bed  Taken 07/08/2017 0105 by Michele Rockers, RN  Individualized Interventions/Recommendations #5 (if applicable): MOnitor pt's vital signs. Monitor pt's Flolan Dose and Dopamine dose- monitor lines and connections.  Taken 07/10/2017 0400 by Keenan Bachelor, RN  Outcome Evaluation (rationale for progressing/not progressing) every shift: Pt new cather site oozing small amt of blood. MD to bedside to redress. Pt VSS.

## 2017-07-10 NOTE — Interdisciplinary (Signed)
Pt reported increased bloody drainage from old line site and dressing was noted to be saturated. Dry gauze applied to site to reinforce. MD notified. MD came to bedside and changed dressing with RN assist. New dressing included surgicel, gauze and paper tape. Within a couple hours MD noted dressing to be almost saturated again. RN changed gauze dressing, leaving surgicel intact. Site continues to drain so MD notified IR and requested them to come to bedside to assess. Pt vitals remain stable. AM labs showed improved H&H and PLTs.

## 2017-07-10 NOTE — Interdisciplinary (Signed)
Pt L chest cath oozing monitored closely. Site continues to ooze and dressing is saturated again. Dressing reinforced and MD notified that IR has not seen pt yet. Unable to get ahold of IR. MD notified. Pt stable, no signs of distress.

## 2017-07-11 LAB — BASIC METABOLIC PANEL, BLOOD
Anion Gap: 17 mmol/L — ABNORMAL HIGH (ref 7–15)
Anion Gap: 17 mmol/L — ABNORMAL HIGH (ref 7–15)
BUN: 123 mg/dL — ABNORMAL HIGH (ref 8–23)
BUN: 120 mg/dL — ABNORMAL HIGH (ref 8–23)
Bicarbonate: 21 mmol/L — ABNORMAL LOW (ref 22–29)
Bicarbonate: 20 mmol/L — ABNORMAL LOW (ref 22–29)
Calcium: 8.7 mg/dL (ref 8.5–10.6)
Calcium: 8.7 mg/dL (ref 8.5–10.6)
Chloride: 96 mmol/L — ABNORMAL LOW (ref 98–107)
Chloride: 96 mmol/L — ABNORMAL LOW (ref 98–107)
Creatinine: 1.96 mg/dL — ABNORMAL HIGH (ref 0.51–0.95)
Creatinine: 2.45 mg/dL — ABNORMAL HIGH (ref 0.51–0.95)
GFR: 25 mL/min
GFR: 20 mL/min
Glucose: 104 mg/dL — ABNORMAL HIGH (ref 70–99)
Glucose: 92 mg/dL (ref 70–99)
Potassium: 3.7 mmol/L (ref 3.5–5.1)
Potassium: 4.4 mmol/L (ref 3.5–5.1)
Sodium: 134 mmol/L — ABNORMAL LOW (ref 136–145)
Sodium: 133 mmol/L — ABNORMAL LOW (ref 136–145)

## 2017-07-11 LAB — MAGNESIUM, BLOOD
Magnesium: 2.1 mg/dL (ref 1.6–2.4)
Magnesium: 2.1 mg/dL (ref 1.6–2.4)

## 2017-07-11 LAB — PHOSPHORUS, BLOOD
Phosphorous: 5 mg/dL — ABNORMAL HIGH (ref 2.7–4.5)
Phosphorous: 5 mg/dL — ABNORMAL HIGH (ref 2.7–4.5)

## 2017-07-11 MED ORDER — POTASSIUM CHLORIDE CRYS CR 20 MEQ OR TBCR
20.00 meq | EXTENDED_RELEASE_TABLET | Freq: Once | ORAL | Status: AC
Start: 2017-07-11 — End: 2017-07-11
  Administered 2017-07-11: 20 meq via ORAL
  Filled 2017-07-11: qty 1

## 2017-07-11 MED ORDER — POTASSIUM CHLORIDE 20 MEQ/50ML IV SOLN
20.00 meq | INTRAVENOUS | Status: AC
Start: 2017-07-11 — End: 2017-07-11
  Administered 2017-07-11 (×2): 20 meq via INTRAVENOUS
  Filled 2017-07-11 (×2): qty 50

## 2017-07-11 NOTE — Plan of Care (Signed)
Problem: Promotion of Health and Safety  Goal: Promotion of Health and Safety  Description  The patient remains safe, receives appropriate treatment and achieves optimal outcomes (physically, psychosocially, and spiritually) within the limitations of the disease process by discharge.    Information below is the current care plan.  Outcome: Progressing  Flowsheets  Taken 07/11/2017 0900 by Jolee Ewing, RN  Patient /Family stated Goal: To go outside for 15 minutes   Taken 07/08/2017 1116 by Darcus Austin, RN  Guidelines: Inpatient Nursing Guidelines  Taken 07/11/2017 1317 by Jolee Ewing, RN  Individualized Interventions/Recommendations #1: monitor L chest old cath site dressing for increased drainage. Change dressing prn.  Individualized Interventions/Recommendations #2 (if applicable): Keep pt and family informed of all care and any changes to help minimize anxiety.  Individualized Interventions/Recommendations #3 (if applicable): Notified MD of arrythmias and administer electrolytes as ordered. Monitor labs q12h as ordered and notify MD.  Outcome Evaluation (rationale for progressing/not progressing) every shift: vss, no signs of distress throughout shift. MD notified for witnessed 33 seconds of vtach, pt remained asympotmatic throughout. Pt appetite improved. Plan to go outside with husband after lunch. Will continue to monitor

## 2017-07-11 NOTE — Plan of Care (Signed)
Problem: Promotion of Health and Safety  Goal: Promotion of Health and Safety  Description  The patient remains safe, receives appropriate treatment and achieves optimal outcomes (physically, psychosocially, and spiritually) within the limitations of the disease process by discharge.    Information below is the current care plan.  Outcome: Progressing  Flowsheets  Taken 07/10/2017 2045 by Atlee Abide, RN  Patient /Family stated Goal: Stop bleeding and be comfortable  Taken 07/08/2017 1116 by Darcus Austin, RN  Guidelines: Inpatient Nursing Guidelines  Taken 07/10/2017 1859 by Jolee Ewing, RN  Individualized Interventions/Recommendations #1: Change L chest cath dressing prn for saturation. Notify MD for continued oozing from old cath site. Quick clot obtained from IR for future dressing changes if needed.  Taken 07/11/2017 0504 by Atlee Abide, RN  Individualized Interventions/Recommendations #3 (if applicable): Monitor VS and I/O  Individualized Interventions/Recommendations #4 (if applicable): Educate on pt safety and fall precautions  Individualized Interventions/Recommendations #5 (if applicable): Monitor skin for signs of breakdown  Outcome Evaluation (rationale for progressing/not progressing) every shift: Pt VSS, continue to monitor I/O. LU chest cath site dressing changed d/t mild oozing. No bleeding noted after dressing change; skin kept clean and dry. Pt remained safe and calls for assistance.

## 2017-07-11 NOTE — Progress Notes (Signed)
[facility] 56 Days 13 Hours      Subjective:  LIJ tunneled cath site less bleeding    Medications:  Scheduled Meds   epoetin alfa-epbx  4,000 Units Once per day on Fri    furosemide  100 mg 4x Daily    lansoprazole  30 mg QAM AC    medroxyPROGESTERone  1.25 mg Q24H NR    melatonin  5 mg HS    metolazone  5 mg Daily    potassium chloride  20 mEq Q1H    Prostacyclin Cassette Change   Daily    Prostacyclin Tubing   Once per day on Mon Wed Fri    Prostacylin Batteries   Once per day on Mon    saccharomyces boulardii  250 mg BID    sildenafil  80 mg TID    sodium chloride (PF)  3 mL Q8H    spironolactone  75 mg BID    thiamine  100 mg Daily    vitamin B complex-vitamin C-folic acid  1 capsule Daily    vitamin b-12  50 mcg Daily     PRN Meds   acetaminophen  650 mg Q6H PRN    bisacodyl  10 mg Daily PRN    hydrocortisone  1 Application S0F PRN    nalOXone  0.1 mg Q2 Min PRN    ondansetron  4 mg Q6H PRN    senna  2 tablet Daily PRN    simethicone  80 mg Q6H PRN    sodium chloride (PF)  10 mL PRN    sodium chloride (PF)  3 mL PRN    sodium chloride   Continuous PRN     IV Meds   DOPamine 2 mcg/kg/min (07/10/17 1900)    epoprostenol (FLOLAN) infusion 35 ng/kg/min (07/10/17 1900)    sodium chloride         Vitals:    Latest Entry  Range (last 24 hours)    Temperature: 97.5 F (36.4 C)  Temp  Avg: 98 F (36.7 C)  Min: 97.5 F (36.4 C)  Max: 98.4 F (36.9 C)    Blood pressure (BP): 118/68  BP  Min: 103/58  Max: 124/64    Heart Rate: 106  Pulse  Avg: 102.4  Min: 97  Max: 107    Respirations: 22  Resp  Avg: 19.3  Min: 18  Max: 20    SpO2: 98 %  SpO2  Avg: 98.5 %  Min: 97 %  Max: 99 %       No data recorded     Weight: 51.7 kg (113 lb 15.7 oz)  Percentage Weight Change (%): 1.37 %      Intake/Output Summary (Last 24 hours) at 07/10/2017 1611  Last data filed at 07/10/2017 1607  Gross per 24 hour   Intake 559.59 ml   Output 1500 ml   Net -940.41 ml              Exam:   Gen: NAD, comfortable in chair  NECK: JVD  Chest wall with dry gauze bandage  CV: RRR, TR no S3  RESP: Rales bases bilat  ABD: soft,  EXT: 1+ edema  NEURO: non-focal, grossly intact    Labs:   CBC  Recent Labs     07/09/17  0536 07/10/17  0450   WBC 3.4* 4.3   HGB 8.3* 8.7*   HCT 25.7* 26.7*   PLT 50* 52*   SEG 65  77   LYMPHS 26 18   MONOS 6 3      Chemistry  Recent Labs     07/09/17  1700 07/10/17  0450   NA 132* 131*   K 4.0 3.7   CL 94* 94*   BICARB 21* 20*   BUN 116* 115*   CREAT 1.93* 2.23*   GLU 119* 84   [place] 9.0 8.9   MG 2.0 2.1   PHOS 4.7* 5.1*         ASSESSMENT/PLAN:    67 yo F with WHO I PAH associated with SLE/sjogrens, LIPand prior anorexogen use admitted with acute on chronic right heart failure and volume overload as well as AKI and thrombocytopenia.IRparacentesis on6/10 5.5 Lremoved.    Edemaimproved but persistentascites. + C diff infectiontreatment ongoing. D improving    -Repeat therapeutic para 6/27 3.5 Liters removed  - Arrange home dopamine @ 2 mcg/kg/min  -IR placed double lumentunneled line 6/27.  Unable to remove all of the old single lumen line  - Lasix 100 mg PO QID.   -Continuemetolazone 5 mg  -Remove bladder catheter  -Dopamine19mcg/kg/min  -Spironolactoneand replete K  - Continue home sildenafil and flolan at 35 ng/kg/min  - Continue PO vanco(day15) for c.diff.  Persistent D.  - Continue oxygen suppl  - Fluid restrict to 1.5 L/day  -Completed iron sucrose for iron def anemia. Epogen Qweek.  -Discussed goals of care at length with patient and husband.While they understand that her condition is deteriorating and this is likely terminal progression of her disease they are not yet ready for hospice or palliative care services.     [name] M.D.  Pulmonary and Critical Care Attending  Pager [phone]

## 2017-07-12 LAB — PHOSPHORUS, BLOOD
Phosphorous: 6 mg/dL — ABNORMAL HIGH (ref 2.7–4.5)
Phosphorous: 6.1 mg/dL — ABNORMAL HIGH (ref 2.7–4.5)

## 2017-07-12 LAB — CBC WITH DIFF, BLOOD
ANC-Automated: 2.9 10*3/uL (ref 1.6–7.0)
Abs Basophils: 0 10*3/uL (ref <0.1)
Abs Eosinophils: 0 10*3/uL (ref 0.1–0.5)
Abs Lymphs: 0.9 10*3/uL (ref 0.8–3.1)
Abs Monos: 0.2 10*3/uL (ref 0.2–0.8)
Basophils: 1 %
Eosinophils: 0 %
Hct: 23.9 % — ABNORMAL LOW (ref 34.0–45.0)
Hgb: 7.8 gm/dL — ABNORMAL LOW (ref 11.2–15.7)
Imm Gran %: 2 % — ABNORMAL HIGH (ref <1)
Imm Gran Abs: 0.1 10*3/uL (ref <0.1)
Lymphocytes: 23 %
MCH: 26.9 pg (ref 26.0–32.0)
MCHC: 32.6 g/dL (ref 32.0–36.0)
MCV: 82.4 um3 (ref 79.0–95.0)
Monocytes: 5 %
Plt Count: 48 10*3/uL — ABNORMAL LOW (ref 140–370)
RBC: 2.9 10*6/uL — ABNORMAL LOW (ref 3.90–5.20)
RDW: 19.6 % — ABNORMAL HIGH (ref 12.0–14.0)
Segs: 70 %
WBC: 4.1 10*3/uL (ref 4.0–10.0)

## 2017-07-12 LAB — BASIC METABOLIC PANEL, BLOOD
Anion Gap: 17 mmol/L — ABNORMAL HIGH (ref 7–15)
Anion Gap: 18 mmol/L — ABNORMAL HIGH (ref 7–15)
BUN: 123 mg/dL — ABNORMAL HIGH (ref 8–23)
BUN: 124 mg/dL — ABNORMAL HIGH (ref 8–23)
Bicarbonate: 19 mmol/L — ABNORMAL LOW (ref 22–29)
Bicarbonate: 19 mmol/L — ABNORMAL LOW (ref 22–29)
Calcium: 8.6 mg/dL (ref 8.5–10.6)
Calcium: 8.7 mg/dL (ref 8.5–10.6)
Chloride: 96 mmol/L — ABNORMAL LOW (ref 98–107)
Chloride: 95 mmol/L — ABNORMAL LOW (ref 98–107)
Creatinine: 2.41 mg/dL — ABNORMAL HIGH (ref 0.51–0.95)
Creatinine: 2.49 mg/dL — ABNORMAL HIGH (ref 0.51–0.95)
GFR: 20 mL/min
GFR: 19 mL/min
Glucose: 91 mg/dL (ref 70–99)
Glucose: 133 mg/dL — ABNORMAL HIGH (ref 70–99)
Potassium: 3.9 mmol/L (ref 3.5–5.1)
Potassium: 3.8 mmol/L (ref 3.5–5.1)
Sodium: 132 mmol/L — ABNORMAL LOW (ref 136–145)
Sodium: 132 mmol/L — ABNORMAL LOW (ref 136–145)

## 2017-07-12 LAB — MAGNESIUM, BLOOD
Magnesium: 2.2 mg/dL (ref 1.6–2.4)
Magnesium: 2 mg/dL (ref 1.6–2.4)

## 2017-07-12 MED ORDER — VANCOMYCIN 50 MG/ML ORAL SOLN (COMPOUNDED) (~~LOC~~)
125.0000 mg | Freq: Two times a day (BID) | ORAL | Status: DC
Start: 2017-07-12 — End: 2017-07-12

## 2017-07-12 MED ORDER — VANCOMYCIN 50 MG/ML ORAL SOLN (COMPOUNDED) (~~LOC~~)
125.0000 mg | Freq: Two times a day (BID) | ORAL | Status: DC
Start: 2017-07-22 — End: 2017-07-12

## 2017-07-12 MED ORDER — VANCOMYCIN 50 MG/ML ORAL SOLN (COMPOUNDED) (~~LOC~~)
125.0000 mg | Freq: Every day | ORAL | Status: DC
Start: 2017-07-30 — End: 2017-07-12

## 2017-07-12 MED ORDER — FUROSEMIDE 40 MG OR TABS
100.00 mg | ORAL_TABLET | Freq: Three times a day (TID) | ORAL | Status: DC
Start: 2017-07-12 — End: 2017-07-15
  Administered 2017-07-12 – 2017-07-15 (×10): 100 mg via ORAL
  Filled 2017-07-12 (×10): qty 1

## 2017-07-12 MED ORDER — VANCOMYCIN 50 MG/ML ORAL SOLN (COMPOUNDED) (~~LOC~~)
125.0000 mg | Freq: Four times a day (QID) | ORAL | Status: DC
Start: 2017-07-12 — End: 2017-07-12

## 2017-07-12 MED ORDER — DESMOPRESSIN ACETATE 4 MCG/ML IJ SOLN
0.30 ug/kg | Freq: Once | INTRAMUSCULAR | Status: AC
Start: 2017-07-12 — End: 2017-07-12
  Administered 2017-07-12: 15.44 ug via INTRAVENOUS
  Filled 2017-07-12: qty 3.86

## 2017-07-12 MED ORDER — VANCOMYCIN 50 MG/ML ORAL SOLN (COMPOUNDED) (~~LOC~~)
125.00 mg | ORAL | Status: DC
Start: 2017-08-03 — End: 2017-07-15

## 2017-07-12 MED ORDER — VANCOMYCIN 50 MG/ML ORAL SOLN (COMPOUNDED) (~~LOC~~)
125.00 mg | Freq: Every day | ORAL | Status: DC
Start: 2017-07-26 — End: 2017-07-15

## 2017-07-12 MED ORDER — VANCOMYCIN 50 MG/ML ORAL SOLN (COMPOUNDED) (~~LOC~~)
125.0000 mg | ORAL | Status: DC
Start: 2017-08-06 — End: 2017-07-12

## 2017-07-12 MED ORDER — VANCOMYCIN 50 MG/ML ORAL SOLN (COMPOUNDED) (~~LOC~~)
125.00 mg | Freq: Two times a day (BID) | ORAL | Status: DC
Start: 2017-07-12 — End: 2017-07-15
  Administered 2017-07-12 – 2017-07-15 (×8): 125 mg via ORAL
  Filled 2017-07-12 (×7): qty 2.5

## 2017-07-12 NOTE — Progress Notes (Signed)
[facility] 57 Days 13 Hours    Subjective:     Medications:  Scheduled Meds   epoetin alfa-epbx  4,000 Units Once per day on Fri    furosemide  100 mg TID    lansoprazole  30 mg QAM AC    medroxyPROGESTERone  1.25 mg Q24H NR    melatonin  5 mg HS    metolazone  5 mg Daily    Prostacyclin Cassette Change   Daily    Prostacyclin Tubing   Once per day on Mon Wed Fri    Prostacylin Batteries   Once per day on Mon    saccharomyces boulardii  250 mg BID    sildenafil  80 mg TID    sodium chloride (PF)  3 mL Q8H    spironolactone  75 mg BID    thiamine  100 mg Daily    vitamin B complex-vitamin C-folic acid  1 capsule Daily    vitamin b-12  50 mcg Daily     PRN Meds   acetaminophen  650 mg Q6H PRN    bisacodyl  10 mg Daily PRN    hydrocortisone  1 Application I5W PRN    nalOXone  0.1 mg Q2 Min PRN    ondansetron  4 mg Q6H PRN    senna  2 tablet Daily PRN    simethicone  80 mg Q6H PRN    sodium chloride (PF)  10 mL PRN    sodium chloride (PF)  3 mL PRN    sodium chloride   Continuous PRN     IV Meds   DOPamine 2 mcg/kg/min (07/12/17 0551)    epoprostenol (FLOLAN) infusion 35 ng/kg/min (07/12/17 0551)    sodium chloride         Vitals:    Latest Entry  Range (last 24 hours)    Temperature: 98.2 F (36.8 C)  Temp  Avg: 98 F (36.7 C)  Min: 97.5 F (36.4 C)  Max: 98.4 F (36.9 C)    Blood pressure (BP): 103/55  BP  Min: 103/58  Max: 124/64    Heart Rate: 103  Pulse  Avg: 102.4  Min: 97  Max: 107    Respirations: 18  Resp  Avg: 19.3  Min: 18  Max: 20    SpO2: 98 %  SpO2  Avg: 98.5 %  Min: 97 %  Max: 99 %       No data recorded     Weight: 51.4 kg (113 lb 5.1 oz)  Percentage Weight Change (%): -0.58 %      Intake/Output Summary (Last 24 hours) at 07/10/2017 1611  Last data filed at 07/10/2017 1607  Gross per 24 hour   Intake 559.59 ml   Output 1500 ml   Net -940.41 ml       Exam:   Gen: NAD, comfortable in chair  NECK: JVD  Chest wall with dry gauze  bandage  CV: RRR, TR no S3  RESP: Rales bases bilat  ABD: soft,  EXT: 1+ edema  NEURO: non-focal, grossly intact    Labs:   CBC  Recent Labs     07/09/17  0536 07/10/17  0450   WBC 3.4* 4.3   HGB 8.3* 8.7*   HCT 25.7* 26.7*   PLT 50* 52*   SEG 65 77   LYMPHS 26 18   MONOS 6 3      Chemistry  Recent Labs  07/09/17  1700 07/10/17  0450   NA 132* 131*   K 4.0 3.7   CL 94* 94*   BICARB 21* 20*   BUN 116* 115*   CREAT 1.93* 2.23*   GLU 119* 84   [place] 9.0 8.9   MG 2.0 2.1   PHOS 4.7* 5.1*         ASSESSMENT/PLAN:    67 yo F with WHO I PAH associated with SLE/sjogrens, LIPand prior anorexogen use admitted with acute on chronic right heart failure and volume overload as well as AKI and thrombocytopenia.IRparacentesis on6/10 5.5 Lremoved.    Edemaimproved but persistentascites. + C diff infectiontreatment ongoing. D improving    -Repeat therapeutic para 6/27 3.5 Liters removed  - Arrange home dopamine @ 2 mcg/kg/min  -IR placed double lumentunneled line 6/27.  Unable to remove all of the old single lumen line  - Lasix 100 mg PO reduce to TID.   -Continuemetolazone 5 mg  -Remove bladder catheter  -Dopamine96mcg/kg/min  -Spironolactoneand replete K  - Continue home sildenafil and flolan at 35 ng/kg/min  - Continue PO vanco(day16) for c.diff.  Persistent D.  - Continue oxygen suppl  - Fluid restrict to 1.5 L/day  -Completed iron sucrose for iron def anemia. Epogen Qweek.  -Discussed goals of care at length with patient and husband.While they understand that her condition is deteriorating and this is likely terminal progression of her disease they are not yet ready for hospice or palliative care services.     Diana Chambers M.D.  Pulmonary and Critical Care Attending  Pager [phone]

## 2017-07-12 NOTE — Plan of Care (Signed)
Problem: Promotion of Health and Safety  Goal: Promotion of Health and Safety  Description  The patient remains safe, receives appropriate treatment and achieves optimal outcomes (physically, psychosocially, and spiritually) within the limitations of the disease process by discharge.    Information below is the current care plan.  Outcome: Not Progressing  Flowsheets  Taken 07/11/2017 2000 by Mercy Moore, RN  Patient /Family stated Goal: Sleep well tonight  Taken 07/11/2017 1317 by Jolee Ewing, RN  Individualized Interventions/Recommendations #1: monitor L chest old cath site dressing for increased drainage. Change dressing prn.  Individualized Interventions/Recommendations #2 (if applicable): Keep pt and family informed of all care and any changes to help minimize anxiety.  Individualized Interventions/Recommendations #3 (if applicable): Notified MD of arrythmias and administer electrolytes as ordered. Monitor labs q12h as ordered and notify MD.  Taken 07/11/2017 0504 by Atlee Abide, RN  Individualized Interventions/Recommendations #4 (if applicable): Educate on pt safety and fall precautions  Individualized Interventions/Recommendations #5 (if applicable): Monitor skin for signs of breakdown  Taken 07/12/2017 0217 by Mercy Moore, RN  Outcome Evaluation (rationale for progressing/not progressing) every shift: Pt A&O, VSS. No arrythmias this shift. Electrolytes are WNL. Pt and family informed of newest lab values. L chest cath site is free of drainage. Skin is fragile, red, and ecchymotic and free of breakdown. Pt repositions frequently while in bed and up in chair. Pt is resting comfortably in NAD.

## 2017-07-12 NOTE — Plan of Care (Signed)
Problem: Promotion of Health and Safety  Goal: Promotion of Health and Safety  Description  The patient remains safe, receives appropriate treatment and achieves optimal outcomes (physically, psychosocially, and spiritually) within the limitations of the disease process by discharge.    Information below is the current care plan.  Outcome: Not Progressing  Flowsheets  Taken 07/12/2017 0803 by Jolee Ewing, RN  Patient /Family stated Goal: go outside  Taken 07/08/2017 1116 by Darcus Austin, RN  Guidelines: Inpatient Nursing Guidelines  Taken 07/12/2017 1647 by Jolee Ewing, RN  Individualized Interventions/Recommendations #1: monitor L chest old cath site dressing for increased drainage. Change dressing prn. MD notified for increased drainage at old broviac site. Manunal pressure held for 30 mins and new dressing placed.  Individualized Interventions/Recommendations #3 (if applicable): Avoid tape and other strong adhesives on skin to avoid skin tears upon removal. Mepilex used instead of tape to hold new dressing in place  Outcome Evaluation (rationale for progressing/not progressing) every shift: vss throughout shift. Old broviac site with increased drainage and saturated dressing requiring manual pressure, MD assessment and new dressing with quick clot. Family anxious over change in condition. New labs show decrease in HGB and plts, MD notified. Pt and spouse still need dopamine education and for dressing to remain CDI, and site without bleeding  Taken 07/11/2017 1317 by Jolee Ewing, RN  Individualized Interventions/Recommendations #2 (if applicable): Keep pt and family informed of all care and any changes to help minimize anxiety.

## 2017-07-13 LAB — BASIC METABOLIC PANEL, BLOOD
Anion Gap: 19 mmol/L — ABNORMAL HIGH (ref 7–15)
Anion Gap: 19 mmol/L — ABNORMAL HIGH (ref 7–15)
BUN: 127 mg/dL — ABNORMAL HIGH (ref 8–23)
BUN: 130 mg/dL — ABNORMAL HIGH (ref 8–23)
Bicarbonate: 18 mmol/L — ABNORMAL LOW (ref 22–29)
Bicarbonate: 20 mmol/L — ABNORMAL LOW (ref 22–29)
Calcium: 8.6 mg/dL (ref 8.5–10.6)
Calcium: 8.9 mg/dL (ref 8.5–10.6)
Chloride: 95 mmol/L — ABNORMAL LOW (ref 98–107)
Chloride: 94 mmol/L — ABNORMAL LOW (ref 98–107)
Creatinine: 2.49 mg/dL — ABNORMAL HIGH (ref 0.51–0.95)
Creatinine: 2.43 mg/dL — ABNORMAL HIGH (ref 0.51–0.95)
GFR: 19 mL/min
GFR: 20 mL/min
Glucose: 94 mg/dL (ref 70–99)
Glucose: 99 mg/dL (ref 70–99)
Potassium: 3.6 mmol/L (ref 3.5–5.1)
Potassium: 3.7 mmol/L (ref 3.5–5.1)
Sodium: 132 mmol/L — ABNORMAL LOW (ref 136–145)
Sodium: 133 mmol/L — ABNORMAL LOW (ref 136–145)

## 2017-07-13 LAB — CBC WITH DIFF, BLOOD
ANC-Automated: 3.3 10*3/uL (ref 1.6–7.0)
Abs Basophils: 0 10*3/uL (ref <0.1)
Abs Eosinophils: 0 10*3/uL (ref 0.1–0.5)
Abs Lymphs: 1.2 10*3/uL (ref 0.8–3.1)
Abs Monos: 0.3 10*3/uL (ref 0.2–0.8)
Basophils: 0 %
Eosinophils: 0 %
Hct: 23.3 % — ABNORMAL LOW (ref 34.0–45.0)
Hgb: 7.4 gm/dL — ABNORMAL LOW (ref 11.2–15.7)
Imm Gran %: 2 % — ABNORMAL HIGH (ref <1)
Imm Gran Abs: 0.1 10*3/uL (ref <0.1)
Lymphocytes: 24 %
MCH: 26.2 pg (ref 26.0–32.0)
MCHC: 31.8 g/dL — ABNORMAL LOW (ref 32.0–36.0)
MCV: 82.6 um3 (ref 79.0–95.0)
MPV: 9.3 fL — ABNORMAL LOW (ref 9.4–12.4)
Monocytes: 7 %
Plt Count: 44 10*3/uL — ABNORMAL LOW (ref 140–370)
RBC: 2.82 10*6/uL — ABNORMAL LOW (ref 3.90–5.20)
RDW: 19.6 % — ABNORMAL HIGH (ref 12.0–14.0)
Segs: 67 %
WBC: 4.9 10*3/uL (ref 4.0–10.0)

## 2017-07-13 LAB — MAGNESIUM, BLOOD
Magnesium: 2.2 mg/dL (ref 1.6–2.4)
Magnesium: 2.2 mg/dL (ref 1.6–2.4)

## 2017-07-13 LAB — PHOSPHORUS, BLOOD
Phosphorous: 6.8 mg/dL — ABNORMAL HIGH (ref 2.7–4.5)
Phosphorous: 6.9 mg/dL — ABNORMAL HIGH (ref 2.7–4.5)

## 2017-07-13 NOTE — Interdisciplinary (Addendum)
Required MD documentation containing Medicare verbiage obtained from Dr. [name] and sent to [facility] for review.  Per [facility], no cardiology evaluation documentation available.  Based on this lack of information/consultation, [facility] Medicare department has denied medication coverage through Medicare Part B.  [facility] attempted to bill through patient's Medicare Part D coverage but [facility] is not a contracted provider.      [facility] referral initiated and is willing to accept the patient for home IV Dopamine infusion and will provide [facility] for infusion.  [facility] [phone] met patient and spouse at the bedside to provide consultation and education.  Confirmed physical address: [address] [place] [address].  Alternative mobile number: [phone].  Per Lilianna, Dopamine to be delivered to the bedside by 1300 tomorrow.  She will also return to the bedside in AM to finalize d/c plan and teaching.  [facility] flush order placed in patient's chart for MD signature.  Paged Dr. [name] to update on d/c status and pending signature for flush order in chart.  Pt, spouse, [facility], and care team aware and agree with d/c plan.  Incoming CM to coordinate final [facility] d/c plan, confirm bedside delivery of Valetri (Rx to be provided by [facility]) and Dopamine (Rx to be provided by [facility]), and obtain signed flush order to be sent to [facility] when available.    Ria Clock MSN/RN/CNL/PHN  Inpatient Care Manager  [phone]

## 2017-07-13 NOTE — Plan of Care (Signed)
Problem: Promotion of Health and Safety  Goal: Promotion of Health and Safety  Description  The patient remains safe, receives appropriate treatment and achieves optimal outcomes (physically, psychosocially, and spiritually) within the limitations of the disease process by discharge.    Information below is the current care plan.  Outcome: Not Progressing  Flowsheets  Taken 07/12/2017 2000 by Mercy Moore, RN  Patient /Family stated Goal: Have no more bleeding  Taken 07/13/2017 0336 by Mercy Moore, RN  Individualized Interventions/Recommendations #1: Monitor L chest old cath site for bleeding. Administer desmopressin to aid in bleeding cessation per MD orders.  Outcome Evaluation (rationale for progressing/not progressing) every shift: Pt A&O, VSS, afebrile. Desmopressin infused per MD orders; no bleeding noted at old cath site. Pt and husband educated on Desmopressin, handouts provided. Pt encouraged to shift from side to side Q2H and keep weight off sacrum. Pt c/o headache, tylenol provided. Pt continues with edema and ascites; diuretics provided, daily weight and strict I/O charted. Pt is resting comfortably in NAD.  Taken 07/11/2017 1317 by Jolee Ewing, RN  Individualized Interventions/Recommendations #2 (if applicable): Keep pt and family informed of all care and any changes to help minimize anxiety.  Taken 07/12/2017 1647 by Jolee Ewing, RN  Individualized Interventions/Recommendations #3 (if applicable): Avoid tape and other strong adhesives on skin to avoid skin tears upon removal. Mepilex used instead of tape to hold new dressing in place  Taken 07/11/2017 0504 by Atlee Abide, RN  Individualized Interventions/Recommendations #4 (if applicable): Educate on pt safety and fall precautions  Individualized Interventions/Recommendations #5 (if applicable): Monitor skin for signs of breakdown

## 2017-07-13 NOTE — Progress Notes (Signed)
[facility] 58 Days 10 Hours      Medications:  Scheduled Meds   epoetin alfa-epbx  4,000 Units Once per day on Fri    furosemide  100 mg TID    lansoprazole  30 mg QAM AC    medroxyPROGESTERone  1.25 mg Q24H NR    melatonin  5 mg HS    metolazone  5 mg Daily    Prostacyclin Cassette Change   Daily    Prostacyclin Tubing   Once per day on Mon Wed Fri    Prostacylin Batteries   Once per day on Mon    saccharomyces boulardii  250 mg BID    sildenafil  80 mg TID    sodium chloride (PF)  3 mL Q8H    spironolactone  75 mg BID    thiamine  100 mg Daily    vancomycin  125 mg BID    [START ON 07/26/2017] vancomycin  125 mg Daily    [START ON 08/03/2017] vancomycin  125 mg Q48H NR    vitamin B complex-vitamin C-folic acid  1 capsule Daily    vitamin b-12  50 mcg Daily     PRN Meds   acetaminophen  650 mg Q6H PRN    bisacodyl  10 mg Daily PRN    hydrocortisone  1 Application F8B PRN    nalOXone  0.1 mg Q2 Min PRN    ondansetron  4 mg Q6H PRN    senna  2 tablet Daily PRN    simethicone  80 mg Q6H PRN    sodium chloride (PF)  10 mL PRN    sodium chloride (PF)  3 mL PRN    sodium chloride   Continuous PRN     IV Meds   DOPamine 2 mcg/kg/min (07/13/17 0730)    epoprostenol (FLOLAN) infusion 35 ng/kg/min (07/13/17 0730)    sodium chloride         Vitals:    Latest Entry  Range (last 24 hours)    Temperature: 97.6 F (36.4 C)  Temp  Avg: 98 F (36.7 C)  Min: 97.5 F (36.4 C)  Max: 98.4 F (36.9 C)    Blood pressure (BP): 128/67  BP  Min: 103/58  Max: 124/64    Heart Rate: 105  Pulse  Avg: 102.4  Min: 97  Max: 107    Respirations: 19  Resp  Avg: 19.3  Min: 18  Max: 20    SpO2: 98 %  SpO2  Avg: 98.5 %  Min: 97 %  Max: 99 %       No data recorded     Weight: 51.6 kg (113 lb 12.1 oz)  Percentage Weight Change (%): 0.39 %      Intake/Output Summary (Last 24 hours) at 07/10/2017 1611  Last data filed at 07/10/2017 1607  Gross per 24 hour   Intake 559.59 ml      Output 1500 ml   Net -940.41 ml       Exam:   Gen: NAD, comfortable in chair  NECK: JVD  Chest wall with dry gauze bandage  CV: RRR, TR no S3  RESP: Rales bases bilat  ABD: soft,  EXT: 1+ edema  NEURO: non-focal, grossly intact    Labs:   CBC  Recent Labs     07/09/17  0536 07/10/17  0450   WBC 3.4* 4.3   HGB 8.3* 8.7*   HCT 25.7* 26.7*   PLT  50* 52*   SEG 65 77   LYMPHS 26 18   MONOS 6 3      Chemistry  Recent Labs     07/09/17  1700 07/10/17  0450   NA 132* 131*   K 4.0 3.7   CL 94* 94*   BICARB 21* 20*   BUN 116* 115*   CREAT 1.93* 2.23*   GLU 119* 84   Lonepine 9.0 8.9   MG 2.0 2.1   PHOS 4.7* 5.1*         ASSESSMENT/PLAN:    67 yo F with WHO I PAH associated with SLE/sjogrens, LIPand prior anorexogen use admitted with acute on chronic right heart failure and volume overload as well as AKI and thrombocytopenia.IRparacentesis on6/10 5.5 Lremoved.    Edemaimproved but persistentascites. + C diff infectiontreatment ongoing. D improving    -Repeat therapeutic para 6/27 3.5 Liters removed  - Arrange home dopamine @ 2 mcg/kg/min  -Patient requires home dopamine due to NYHA class IV heart failure.    -Dopamine has resulted in improved NYHA functional class as well as improved renal function and ability to successfully treat with oral diuretics.  Without dopamine infusion at home the patient will be unable to be treated at home and will remain an inpatient on chronic dopamine infusion in the hospital.    -Dopamine is palliative.  She is not a candidate for RVAD or lung transplant  - Lasix 100 mg PO reduce to TID.   -Continuemetolazone 5 mg  -Remove bladder catheter  -Dopamine52mcg/kg/min  -Spironolactoneand replete K  - Continue home sildenafil and flolan at 35 ng/kg/min  - Continue PO vanco(day16) for c.diff.  Persistent D.  - Continue oxygen suppl  - Fluid restrict to 1.5 L/day  -Completed iron sucrose for iron def anemia. Epogen Qweek.  -Discussed goals of care at length with patient and  husband.While they understand that her condition is deteriorating and this is likely terminal progression of her disease they are not yet ready for hospice or palliative care services.     [name] M.D.  Pulmonary and Critical Care Attending  Pager [phone]

## 2017-07-13 NOTE — Discharge Summary (Signed)
Patient Name:  Diana Chambers    Principal Diagnosis (required):  Acute on chronic RV failure    Hospital Problem List (required):  Active Hospital Problems    Diagnosis    Edema [R60.9]      Resolved Hospital Problems   No resolved problems to display.       Additional Hospital Diagnoses ("rule out" or "suspected" diagnoses, etc.):  Malnutrition Diagnosis (if any): Severe malnutrition  Pulmonary Hypertension   SLE/Sjogrens  C diff colitis   Atrial fibrillation   Thrombocytopenia   Hyponatremia    Principal Procedure During This Hospitalization (required):  Paracentesis   Double lumen tunneled catheter     Consultations Obtained During This Hospitalization:  Interventional Radiology   Palliative Care  Nephrology    Key consultant recommendations:  Palliative Care:   Ms. Grajeda is a 67 yo woman with WHO I PAH associated with SLE/sjogrens, LIPand prior anorexogen use admitted with acute on chronic right heart failure and volume overload as well as AKI and thrombocytopenia.   Current plan to d/c home with Flolan and dopamine infusions.    GOC - In discussion today, patient voices desire to be able to go home but endorses worry about transition home. Husband voices desire for patient to be able to live as long as possible. Patient endorses worry about her husband and inquires into acquiring additional help at home.    Functional status: PPS 60%    RECOMMENDATIONS:  -Agree with current plan for HH support at home.   -May benefit from additional options for in home support resources     Nephrology:  # AKI on CKD in the setting of PAH/Rt sided HF, discordant I/Os and weight.  -sCr uptrending.  -Solumedrol daily.  -Large ascites; query compressive ascites as current etiology for AKI.  -Estimated dry weight ~129 lb, possibly lower d/t prolonged hospitalization and loss of muscle weight.  -Defer to pulmonary team regarding transitioning lasix from IV to PO.  -06/02 explained in detail the CKD stage and her risk  of progression to ESRD- pt mentioned she wouldn't want dialysis if it got to that.    # Lytes:Suspect hypervolemic hyponatremia- downtrending, continue diuresis. Hypokalemia- replete per primary team.  # Volume: Hypervolemic. Recommend fluid restriction 1060m/day.Diuresis regimen: lasix + spot bumex, spironolactone, metolazone- consider IV diuril given possible gut edema. S/p 5.5L IR paracentesis  # Acid-Base: bicarb acceptable but downtrending.  # MBD: phos elevated-Renvela + recommend renal diet or at least low phosphorus diet.  # Anemia:Iron studies with low iron sat ~ 16, s/pIron sucrose 200 mg IV qday x5 doses (end 06/09), Epogen 4000U SQ qweekly.    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    # Dispo: f/u on discharge w/ Dr. PDonnelly Stagerin renal clinic.        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 On epoprostenol 35 ng, revatio 80 tid, lasix 100 tid, spironolactone.  .   # Low Plt count    She has noted increasing leg swelling. She is between 130-135 lbs. She doesn't know her dry wt.  No syncope. No chest pain. No SOB. No fever/cough.  She was recently admitted in [place] for 6 days April 22-28, admitted for diarrhea and she was taken off lasix then. She restarted taking lasix again when her leg edema came back on april 30th.      Hospital Course by Problem (required):  # Acute on chronic RV failure - Patient presented in decompensated right heart failure with volume overload, AKI and thrombocytopenia. She was started on IV diuresis and dopamine, and required serial paracentesis for additional fluid removal. Ultimately, was transitioned to oral  lasix with metolazone. She was maintained on home epoprostenol 35 ng/kg/min and sildenafil. Required low dose dopamine the entire admission, so was set up with home dopamine infusion as additional palliative measure. Ongoing goals of care discussions were held with patient and husband who understand overall poor prognosis and likely terminal progression of disease, however were not ready to fully transition to hospice/ palliative care yet. Met with the palliative care team during admission. Discharge medications include lasix 100 mg TID, metolazone 5 mg daily, spironolactone 75 mg BID, sildenafil 80 mg TID, IV epoprostenol 35 ng/kg/min and dopamine 460m/250mL infusion at 4.6 mL/hour). Digoxin was discontinued. Also reduced dose of potassium supplement to 270m daily.      # C diff colitis - Developed diarrhea and found to be positive for c diff. She was started on oral vancomycin, with improvement in symptoms however still with diarrhea. Given persistent/ slow to improve symptoms, was discharged on vancomycin taper.     # Thrombocytopenia - Has had low platelets for the past year. Platelet nadir was ~30K and were stable on day of discharge. Had some oozing at site of L tunneled catheter which improved.    # Catheter bleeding - Received IR-tunneled catheter exchange to switch from a single lumen to a double-lumen catheter. Site continued to have intermittent oozing and was re-evaluated.     # AKI/CKD - Presented with elevated creatinine, likely related to volume overload in the setting of decompensated heart failure. Creatinine has fluctuated throughout admission, but was stable on discharge, around 2.4. She was referred to AKI clinic on discharge.      # Anemia - Had intermittent bleeding from catheter site, but no profuse bleeding. Hemoglobin gradually downtrended during admission. Likely related to blood draws and chronic disease. She was started on epogen which was continued on discharge.     # Severe  malnutrition, present on admission - Seen by nutrition while admitted.     Tests Outstanding at Discharge Requiring Follow Up:  None    Discharge Condition (required):  Stable.    Key Physical Exam Findings at Discharge:  Mental Status Exam: Patient is alert and oriented to person, place, time, and situation.  Physical examination is significant for:  .. Marland KitchenGen: NAD, thin  HEENT: facial flushing   Chest: tunneled double lumen catheter, site clean   CV: RRR, +2+2/7ystolic murmur, loud P2  Pulm: CTAB  Abd: soft, nontender, mildly distended   Ext: no edema     Discharge Diet:  Low-salt.    Discharge Medications:     What To Do With Your Medications      START taking these medications      Add'l Info   DOPamine 400 mg/250 mL infusion  Commonly known as:  INTROPIN  Inject 123 mcg/min into vein continuous.   Quantity:  100 mL  Refills:  1     epoetin alfa-epbx 4000 UNIT/ML  Commonly known as:  RETACRIT  Inject 1 mL (4,000 Units) under  the skin once a week.  Start taking on:  07/20/2017   Quantity:  4 mL  Refills:  3     metolazone 5 MG tablet  Commonly known as:  ZAROXOLYN  Take 1 tablet (5 mg) by mouth daily.   Quantity:  30 tablet  Refills:  1     RENO CAPS 1 MG Caps  Take 1 capsule by mouth daily.  Start taking on:  07/16/2017   Quantity:  30 capsule  Refills:  0     vancomycin 125 MG capsule  Commonly known as:  VANCOCIN  Take 1 capsule (125 mg) by mouth 2 times daily for 11 days, then 1 capsule once daily for 7 days, then 1 capsule every 48 hours for 13 days.   Quantity:  36 capsule  Refills:  0        CHANGE how you take these medications      Add'l Info   potassium chloride 20 MEQ tablet  Commonly known as:  KLOR-CON M20  Take 1 tablet by mouth 2 times daily.   Quantity:  60 tablet  Refills:  1  What changed:     medication strength   how much to take   when to take this     sildenafil 20 MG tablet  Commonly known as:  REVATIO, VIAGRA  Take 4 tablets (80 mg) by mouth 3 times daily.   Quantity:  360 tablet  Refills:   2  What changed:  when to take this     spironolactone 50 MG tablet  Commonly known as:  ALDACTONE  Take one and a half tablets (75 mg) by mouth 2 times daily.   Quantity:  90 tablet  Refills:  1  What changed:     how much to take   when to take this        CONTINUE taking these medications      Add'l Info   azelastine 0.1 % nasal spray  Commonly known as:  ASTELIN  Spray 1 spray into each nostril 2 times daily. Use in each nostril as directed   Quantity:  1 bottle  Refills:  3     estradiol 2 MG vaginal ring  Commonly known as:  ESTRING  Insert 1 Ring vaginally Every 3 months. Follow package directions   Refills:  0     ferrous sulfate 324 (65 Fe) MG Tbec  Take 1 tablet (324 mg) by mouth daily.   Quantity:  30 tablet  Refills:  0     FLOLAN injection  29ng/kg/min  Generic drug:  epoprostenol   Refills:  0     furosemide 40 MG tablet  Commonly known as:  LASIX  Take 2.5 tablets (100 mg) by mouth 3 times daily.   Quantity:  200 tablet  Refills:  3     medroxyPROGESTERone 2.5 MG tablet  Commonly known as:  PROVERA  Take 1.25 mg by mouth daily.   Refills:  0     vitamin b-12 500 MCG tablet  Commonly known as:  CYANOCOBALAMIN  TAKE 1 TABLET BY MOUTH DAILY.   Quantity:  30 each  Refills:  3     vitamin D3 2000 units capsule  Take 1 capsule by mouth daily.   Refills:  0        STOP taking these medications    digoxin 0.125 MG tablet  Commonly known as:  LANOXIN     DIVIGEL 0.25 MG/0.25GM Gel  Generic drug:  Estradiol     folic acid 545 MCG tablet  Commonly known as:  FOLVITE           Where to Get Your Medications      These medications were sent to [facility] #[id number], [place]  [place], [facility] [place] [address]    Phone:  [phone]    epoetin alfa-epbx 4000 UNIT/ML     These medications were sent to [facility] [id number], [place] [address]    Hours:  Mon-Fri: 8:30am-7:00pm; Sat-Sun & Holidays: 9:00am-5:00pm Phone:  [phone]     azelastine 0.1 % nasal spray   metolazone 5 MG tablet   potassium chloride 20 MEQ tablet   RENO CAPS 1 MG Caps   sildenafil 20 MG tablet   spironolactone 50 MG tablet   vancomycin 125 MG capsule     Please check with staff for printed prescription or if prescription was faxed to your pharmacy.    Bring a paper prescription for each of these medications   DOPamine 400 mg/250 mL infusion         Allergies:  Allergies   Allergen Reactions    Allopurinol Hives    Bumex [Bumetanide] Other     Muscle aches and stiffness      Levaquin Swelling and Other     Severe joint pain      Ofloxacin Nausea and Vomiting    Chlorhexidine Itching    Sulfa Drugs Unspecified       Discharge Disposition:  Home.    Discharge Code Status:  Do Not Attempt Resuscitation / full care  This is a change in the patient's code status compared to admission.  The reason for the change in code status is as follows:  poor prognosis     Follow Up Appointments:    Scheduled appointments:  No future appointments.    For appointments requested for after discharge that have not yet been scheduled, refer to the Post Discharge Referrals section of the After Visit Summary.    Discharging 8 Contact Information:  [facility] operator at [phone].

## 2017-07-13 NOTE — Plan of Care (Signed)
Problem: Promotion of Health and Safety  Goal: Promotion of Health and Safety  Description  The patient remains safe, receives appropriate treatment and achieves optimal outcomes (physically, psychosocially, and spiritually) within the limitations of the disease process by discharge.    Information below is the current care plan.  Flowsheets  Taken 07/12/2017 2000 by Mercy Moore, RN  Patient /Family stated Goal: Have no more bleeding  Taken 07/08/2017 1116 by Darcus Austin, RN  Guidelines: Inpatient Nursing Guidelines  Taken 07/13/2017 0336 by Mercy Moore, RN  Individualized Interventions/Recommendations #1: Monitor L chest old cath site for bleeding. Administer desmopressin to aid in bleeding cessation per MD orders.  Taken 07/11/2017 1317 by Jolee Ewing, RN  Individualized Interventions/Recommendations #2 (if applicable): Keep pt and family informed of all care and any changes to help minimize anxiety.  Taken 07/12/2017 1647 by Jolee Ewing, RN  Individualized Interventions/Recommendations #3 (if applicable): Avoid tape and other strong adhesives on skin to avoid skin tears upon removal. Mepilex used instead of tape to hold new dressing in place  Taken 07/11/2017 0504 by Atlee Abide, RN  Individualized Interventions/Recommendations #4 (if applicable): Educate on pt safety and fall precautions  Individualized Interventions/Recommendations #5 (if applicable): Monitor skin for signs of breakdown  Note:   CVL old site  remain dry and intact, no bleeding noted. VSS.

## 2017-07-14 DIAGNOSIS — Z515 Encounter for palliative care: Secondary | ICD-10-CM

## 2017-07-14 LAB — BASIC METABOLIC PANEL, BLOOD
Anion Gap: 20 mmol/L — ABNORMAL HIGH (ref 7–15)
Anion Gap: 20 mmol/L — ABNORMAL HIGH (ref 7–15)
BUN: 129 mg/dL — ABNORMAL HIGH (ref 8–23)
BUN: 132 mg/dL — ABNORMAL HIGH (ref 8–23)
Bicarbonate: 17 mmol/L — ABNORMAL LOW (ref 22–29)
Bicarbonate: 19 mmol/L — ABNORMAL LOW (ref 22–29)
Calcium: 8.5 mg/dL (ref 8.5–10.6)
Calcium: 8.7 mg/dL (ref 8.5–10.6)
Chloride: 94 mmol/L — ABNORMAL LOW (ref 98–107)
Chloride: 94 mmol/L — ABNORMAL LOW (ref 98–107)
Creatinine: 2.37 mg/dL — ABNORMAL HIGH (ref 0.51–0.95)
Creatinine: 2.46 mg/dL — ABNORMAL HIGH (ref 0.51–0.95)
GFR: 20 mL/min
GFR: 20 mL/min
Glucose: 92 mg/dL (ref 70–99)
Glucose: 172 mg/dL — ABNORMAL HIGH (ref 70–99)
Potassium: 3.2 mmol/L — ABNORMAL LOW (ref 3.5–5.1)
Potassium: 3.8 mmol/L (ref 3.5–5.1)
Sodium: 131 mmol/L — ABNORMAL LOW (ref 136–145)
Sodium: 133 mmol/L — ABNORMAL LOW (ref 136–145)

## 2017-07-14 LAB — PHOSPHORUS, BLOOD
Phosphorous: 6.8 mg/dL — ABNORMAL HIGH (ref 2.7–4.5)
Phosphorous: 6.9 mg/dL — ABNORMAL HIGH (ref 2.7–4.5)

## 2017-07-14 LAB — BODY FLUID PATH REVIEW

## 2017-07-14 LAB — MAGNESIUM, BLOOD
Magnesium: 2 mg/dL (ref 1.6–2.4)
Magnesium: 2 mg/dL (ref 1.6–2.4)

## 2017-07-14 LAB — STERILE FLUID CULTURE W/GRAM STAIN, AEROBIC: Fluid Culture Result: NO GROWTH

## 2017-07-14 LAB — FLUID CULTURE IN BLOOD BOTTLES: Fluid Culture in Blood Bottles: NO GROWTH

## 2017-07-14 MED ORDER — POTASSIUM CITRATE 1080 MG OR TBCR
20.00 meq | EXTENDED_RELEASE_TABLET | ORAL | Status: AC
Start: 2017-07-14 — End: 2017-07-14
  Administered 2017-07-14 (×3): 20 meq via ORAL
  Filled 2017-07-14 (×2): qty 2

## 2017-07-14 MED ORDER — POTASSIUM CHLORIDE CRYS CR 20 MEQ OR TBCR
20.00 meq | EXTENDED_RELEASE_TABLET | Freq: Once | ORAL | Status: AC
Start: 2017-07-14 — End: 2017-07-14
  Administered 2017-07-14: 20 meq via ORAL
  Filled 2017-07-14: qty 1

## 2017-07-14 NOTE — Interdisciplinary (Signed)
See spiritual care intervention flow sheet for detailed info about the visit.

## 2017-07-14 NOTE — Interdisciplinary (Signed)
Pt LU chest cath site oozing blood, dressing was saturated. MD paged; gauze dressing changed, used kerlix to secure the gauze and occlusive tape to provide pressure; quick clot was not removed per MD order. Cath site stopped bleeding - continue to monitor.

## 2017-07-14 NOTE — Consults (Addendum)
[facility] Service Consult    Requesting Physician:Poch, Camelia Eng., MD    Reason for Consult: To evaluate and assist patient and family in complex medical decision making surrounding goals of care    HPI: Ms. Strough is a 67 yo woman with WHO I PAH associated with SLE/sjogrens, LIPand prior anorexogen use admitted with acute on chronic right heart failure and volume overload as well as AKI and thrombocytopenia.     Meeting attendees: patient, husband, Clotilde Dieter NP     Meeting Narrative:    Patient/Family understanding of their illness: Both patient and husband voice severity of patient illness and that patient's life will be shorter than hoped. Primarily had questions about various differences in service of palliative care vs. Hospice vs home health. In discussing the philosophy of hospice, recognized the difficulty of if/when to transition to that service based on patient medication needs. Husband likened discontinuation of Flolan to euthanasia.     Patient/Family hopes: To be able to go home and have services available. Husband voices frustration at difficulty with resources in their area. Husband voices goal of having his wife live for as long as possible. Patient stating that no amount of time would be enough.    Patient/Family worries: Endorses worry about uncertainty surrounding leaving the hospital and discharging home. Unsure of what her functional status will be at home. Additionally worries about bleeding from her dressing as that has continued to occur and without identifiable pattern. She would like to feel more comfortable with how to address that outside the hospital. She voices worry about her husband and her family and the impact of her death on them.    Pertinent Medications: Continuous infusions of Flolan, Dopamine     Focused Physical Examination: Sitting at side of bed. NAD. A+Ox4. Breathing even and unlabored.     Symptom Assessment: Did not identify specific symptoms at this  time.      ASSESSMENT:  Ms. Kube is a 67 yo woman with WHO I PAH associated with SLE/sjogrens, LIPand prior anorexogen use admitted with acute on chronic right heart failure and volume overload as well as AKI and thrombocytopenia.   Current plan to d/c home with Flolan and dopamine infusions.    GOC - In discussion today, patient voices desire to be able to go home but endorses worry about transition home. Husband voices desire for patient to be able to live as long as possible. Patient endorses worry about her husband and inquires into acquiring additional help at home.    Functional status: PPS 60%    RECOMMENDATIONS:  -Agree with current plan for HH support at home.   -May benefit from additional options for in home support resources       25  minutes were spent in the care of this patient,  40 minutes were spent in education and care coordination time specifically with complex medical decision making around goals of care.    Thank you very much for involving [place] [facility] care.  Please do not hesitate to contact us with any further questions.     Flossie Dibble, NP  [facility] Team 2   Team 2 pager [phone]

## 2017-07-14 NOTE — Plan of Care (Signed)
Problem: Promotion of Health and Safety  Goal: Promotion of Health and Safety  Description  The patient remains safe, receives appropriate treatment and achieves optimal outcomes (physically, psychosocially, and spiritually) within the limitations of the disease process by discharge.    Information below is the current care plan.  Outcome: Progressing  Flowsheets  Taken 07/14/2017 2000 by Atlee Abide, RN  Patient /Family stated Goal: Get some rest and go home tomorrow  Taken 07/08/2017 1116 by Darcus Austin, RN  Guidelines: Inpatient Nursing Guidelines  Taken 07/14/2017 0156 by Atlee Abide, RN  Individualized Interventions/Recommendations #1: Monitor LU chest for bleeding  Individualized Interventions/Recommendations #2 (if applicable): Monitor VS and I&O  Taken 07/11/2017 0504 by Atlee Abide, RN  Individualized Interventions/Recommendations #4 (if applicable): Educate on pt safety and fall precautions  Individualized Interventions/Recommendations #5 (if applicable): Monitor skin for signs of breakdown  Taken 07/14/2017 2325 by Atlee Abide, RN  Outcome Evaluation (rationale for progressing/not progressing) every shift: Pt VSS, continue to monitor VS and I&O. No signs of bleeding noted, continue to monitor. Pt remained safe, uses call light for assistance. Pt skin kept clean and dry; continue to monitor.

## 2017-07-14 NOTE — Progress Notes (Signed)
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      Had some bleeding from catheter site. Dressing and pressure applied  Also scant nose bleed yesterday evening   Continues to have diarrhea but improving   Nervous about going home     Objective  Temperature:  [97.4 F (36.3 C)-98.8 F (37.1 C)] 98 F (36.7 C) (07/02 1240)  Blood pressure (BP): (107-125)/(59-66) 125/66 (07/02 1240)  Heart Rate:  [102-110] 102 (07/02 1240)  Respirations:  [18-22] 19 (07/02 1240)  Pain Score: 0 (07/02 1240)  O2 Device: Nasal cannula (07/02 1240)  O2 Flow Rate (L/min):  [2 l/min-3 l/min] 3 l/min (07/02 0943)  SpO2:  [95 %-100 %] 100 % (07/02 1240)      Intake/Output Summary (Last 24 hours) at 07/14/2017 1344  Last data filed at 07/14/2017 1114  Gross per 24 hour   Intake 279.93 ml   Output 1101 ml   Net -821.07 ml       Physical Exam  Gen: NAD, sitting up in chair   HEENT: EOMI, facial flushing, JVD  CV: RRR, +8/8 systolic  Pulm: mild crackles   Abd: Soft, non tender   Ext: WWP, 1+ edema  Neuro: CN II-XII grossly intact, strength/sensation grossly intact    Labs  Lab Results   Component Value Date    WBC 4.9 07/13/2017    RBC 2.82 (L) 07/13/2017    HGB 7.4 (L) 07/13/2017    HCT 23.3 (L) 07/13/2017    MCV 82.6 07/13/2017    MCHC 31.8 (L) 07/13/2017    RDW 19.6 (H) 07/13/2017    PLT 44 (L) 07/13/2017    PLT 179 09/12/2008    MPV 9.3 (L) 07/13/2017     Lab Results   Component Value Date    NA 131 (L) 07/14/2017    K 3.2 (L) 07/14/2017    CL 94 (L) 07/14/2017    BICARB 17 (L) 07/14/2017    BUN 129 (H) 07/14/2017    CREAT 2.37 (H) 07/14/2017    GLU 92 07/14/2017    [place] 8.5 07/14/2017       ASSESSMENT AND PLAN  Diana Chambers is a 67 year old year old female with 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     # WHO I PAH - NYHA FC IV. On Flolan, sildenafil, and dopamine. Setting up to discharge home with dopamine for palliative purposes.   - Lasix 100 mg TID with metolazone 5 mg daily   - Spironolactone 75 mg BID   - Flolan 35 ng/kg/min, sildenafil 80 mg TID   - Dopamine 2 mcg/kg/min    - Home agency has set up to deliver dopamine and patient/husband received pump teaching today    - Will get labs once weekly at home    - Home monitoring parameters will be to notify if HR >125 or SBP <70  - Continue oxygen supplementation     # C Diff colitis - Having persistent diarrhea but improving over last few weeks.   - Plan for prolonged vancomycin taper over 6 weeks given persistent symptoms     # Catheter site bleeding - Likely related to thrombocytopenia.   - Will ask IR to re-evaluate   -  Continue to monitor  - Dressing changes     # AKI on CKD - stable creatinine now, overall increased from prior baseline. Making adequate urine.     # OHY:WVPXTGG    Code: DNR/FC    This patient was seen and discussed with Dr. [name].    Lucy Antigua, MD  Fellow, Pulmonary & Critical Care Medicine    Pulmonary Vascular Attending Addendum:  Patient was examined with Dr. Nelida Meuse, imaging and tests reviewed.  Please see above note for full details and plan.    67 yo F with WHO I PAH associated with SLE/sjogrens, LIPand prior anorexogen use admitted with acute on chronic right heart failure and volume overload as well as AKI and thrombocytopenia.IRparacentesis on6/10 5.5 Lremoved.    Edemaimproved but persistentascites. + C diff infectiontreatment ongoing. D improving    -Repeat therapeutic para6/27 3.5 Liters removed  -Arrange home dopamine @ 2 mcg/kg/min  -Patient requires home dopamine due to NYHA class IV heart failure.    -Dopamine has resulted in improved NYHA functional class as well as improved renal function and ability to successfully treat with oral diuretics.  Without dopamine infusion at home the  patient will be unable to be treated at home and will remain an inpatient on chronic dopamine infusion in the hospital.    -Dopamine is palliative.  She is not a candidate for RVAD or lung transplant  - Lasix 100 mg PO TID.   -Continuemetolazone 5 mg  -Dopamine76mcg/kg/min  -Spironolactoneand replete K  - Continue home sildenafil and flolan at 35 ng/kg/min  - Continue PO vanco(day17) for c.diff.  Persistent D.  Plan for extended treatment course  - Continue oxygen suppl  - Fluid restrict to 1.5 L/day  -Completed iron sucrose for iron def anemia. Epogen Qweek.  -Discussed goals of care at length with patient and husband.While they understand that her condition is deteriorating and this is likely terminal progression of her disease they are not yet ready for hospice or palliative care services.   -Remove PICC line in anticipation of D/C home tomorrow.  -Additional dose of DDAVP for uremic plt dysfunction associated chest wall bleeding  -IR to eval tunneled catheter sites for ongoing bleeding.      [name] MD  Pager [phone]

## 2017-07-14 NOTE — Plan of Care (Signed)
Problem: Promotion of Health and Safety  Goal: Promotion of Health and Safety  Description  The patient remains safe, receives appropriate treatment and achieves optimal outcomes (physically, psychosocially, and spiritually) within the limitations of the disease process by discharge.    Information below is the current care plan.  Outcome: Progressing  Flowsheets  Taken 07/14/2017 0156 by Atlee Abide, RN  Patient /Family stated Goal: Get some rest tonight  Individualized Interventions/Recommendations #1: Monitor LU chest for bleeding  Individualized Interventions/Recommendations #2 (if applicable): Monitor VS and I&O  Outcome Evaluation (rationale for progressing/not progressing) every shift: Pt VSS, continue to monitor VS and I&O. Pt had an episode of bleeding on LU chest site; MD paged - dressing changed - quick clot left on site as per MD ordered; applied gauze, kerlix and occlusive tape. continue to monitor site for bleeding. Pt remained safe; and uses the call light for help. Pt skin kept clean and dry; continue to monitor.  Taken 07/08/2017 1116 by Darcus Austin, RN  Guidelines: Inpatient Nursing Guidelines  Taken 07/11/2017 0504 by Atlee Abide, RN  Individualized Interventions/Recommendations #4 (if applicable): Educate on pt safety and fall precautions  Individualized Interventions/Recommendations #5 (if applicable): Monitor skin for signs of breakdown

## 2017-07-15 ENCOUNTER — Other Ambulatory Visit: Payer: Self-pay

## 2017-07-15 MED ORDER — AZELASTINE HCL 137 MCG/SPRAY NA SOLN
1.00 | Freq: Two times a day (BID) | NASAL | 3 refills | Status: DC
Start: 2017-07-15 — End: 2017-08-25
  Filled 2017-07-15: qty 1, 100d supply, fill #0

## 2017-07-15 MED ORDER — SILDENAFIL CITRATE 20 MG OR TABS
80.00 mg | ORAL_TABLET | Freq: Three times a day (TID) | ORAL | 2 refills | Status: DC
Start: 2017-07-15 — End: 2017-07-15

## 2017-07-15 MED ORDER — VANCOMYCIN 50 MG/ML ORAL SOLN (COMPOUNDED) (~~LOC~~)
125.00 mg | ORAL | 0 refills | Status: DC
Start: 2017-08-03 — End: 2017-07-15
  Filled 2017-07-15: qty 18, 14d supply, fill #0

## 2017-07-15 MED ORDER — SPIRONOLACTONE 50 MG OR TABS
75.00 mg | ORAL_TABLET | Freq: Two times a day (BID) | ORAL | 2 refills | Status: DC
Start: 2017-07-15 — End: 2017-07-15

## 2017-07-15 MED ORDER — VANCOMYCIN 50 MG/ML ORAL SOLN (COMPOUNDED) (~~LOC~~)
125.00 mg | Freq: Every day | ORAL | 0 refills | Status: DC
Start: 2017-07-26 — End: 2017-07-15
  Filled 2017-07-15: qty 18, 7d supply, fill #0

## 2017-07-15 MED ORDER — METOLAZONE 5 MG OR TABS
5.00 mg | ORAL_TABLET | Freq: Every day | ORAL | 1 refills | Status: DC
Start: 2017-07-15 — End: 2017-08-27
  Filled 2017-07-15: qty 30, 30d supply, fill #0

## 2017-07-15 MED ORDER — EPOETIN ALFA-EPBX 4000 UNIT/ML IJ SOLN
4000.00 [IU] | INTRAMUSCULAR | 3 refills | Status: DC
Start: 2017-07-20 — End: 2017-07-15

## 2017-07-15 MED ORDER — EPOETIN ALFA-EPBX 4000 UNIT/ML IJ SOLN
4000.00 [IU] | INTRAMUSCULAR | 3 refills | Status: AC
Start: 2017-07-20 — End: ?

## 2017-07-15 MED ORDER — EPOETIN ALFA-EPBX 4000 UNIT/ML IJ SOLN
4000.00 [IU] | INTRAMUSCULAR | 3 refills | Status: DC
Start: 2017-07-20 — End: 2017-07-15
  Filled 2017-07-15: qty 4, 28d supply, fill #0

## 2017-07-15 MED ORDER — VANCOMYCIN 50 MG/ML ORAL SOLN (COMPOUNDED) (~~LOC~~)
125.00 mg | Freq: Two times a day (BID) | ORAL | 0 refills | Status: DC
Start: 2017-07-15 — End: 2017-07-15

## 2017-07-15 MED ORDER — SILDENAFIL CITRATE 20 MG OR TABS
80.00 mg | ORAL_TABLET | Freq: Three times a day (TID) | ORAL | 2 refills | Status: AC
Start: 2017-07-15 — End: ?
  Filled 2017-07-15: qty 360, 30d supply, fill #0

## 2017-07-15 MED ORDER — VANCOMYCIN 50 MG/ML ORAL SOLN (COMPOUNDED) (~~LOC~~)
125.00 mg | ORAL | 0 refills | Status: DC
Start: 2017-08-03 — End: 2017-07-15

## 2017-07-15 MED ORDER — POTASSIUM CHLORIDE CRYS CR 20 MEQ OR TBCR
20.00 meq | EXTENDED_RELEASE_TABLET | Freq: Two times a day (BID) | ORAL | 1 refills | Status: DC
Start: 2017-07-15 — End: 2017-08-13
  Filled 2017-07-15: qty 60, 30d supply, fill #0

## 2017-07-15 MED ORDER — SPIRONOLACTONE 50 MG OR TABS
75.00 mg | ORAL_TABLET | Freq: Two times a day (BID) | ORAL | 1 refills | Status: AC
Start: 2017-07-15 — End: ?
  Filled 2017-07-15: qty 90, 30d supply, fill #0

## 2017-07-15 MED ORDER — NEPHROCAPS 1 MG OR CAPS
1.00 | ORAL_CAPSULE | Freq: Every day | ORAL | 0 refills | Status: DC
Start: 2017-07-16 — End: 2017-08-25
  Filled 2017-07-15: qty 30, 30d supply, fill #0

## 2017-07-15 MED ORDER — VANCOMYCIN 50 MG/ML ORAL SOLN (COMPOUNDED) (~~LOC~~)
125.00 mg | Freq: Every day | ORAL | 0 refills | Status: DC
Start: 2017-07-26 — End: 2017-07-15

## 2017-07-15 MED ORDER — DOPAMINE IN D5W 1.6-5 MG/ML-% IV SOLN
2.00 ug/kg/min | INTRAVENOUS | 1 refills | Status: AC
Start: 2017-07-15 — End: ?

## 2017-07-15 MED ORDER — VANCOMYCIN HCL 125 MG OR CAPS
125.00 mg | ORAL_CAPSULE | Freq: Two times a day (BID) | ORAL | 0 refills | Status: AC
Start: 2017-07-15 — End: 2017-08-15
  Filled 2017-07-15: qty 36, 31d supply, fill #0

## 2017-07-15 MED ORDER — METOLAZONE 5 MG OR TABS
5.00 mg | ORAL_TABLET | Freq: Every day | ORAL | 1 refills | Status: DC
Start: 2017-07-15 — End: 2017-07-15

## 2017-07-15 NOTE — Progress Notes (Signed)
Pharmacist Discharge Medication Reconciliation    After Visit Summary (AVS) medication list was reviewed by pharmacist.     Discharge medication reconciliation performed by pharmacist.    No findings    Of note, this does not include a review on prescription coverage.  If further assistance with coverage is needed, please call the pharmacy where the discharge medications were sent.    Shade Kaley, PharmD  Pager [phone]

## 2017-07-15 NOTE — Interdisciplinary (Signed)
Nutrition Follow Up Assessment      A: 67 yo F with WHO I PAH associated with SLE/sjogrens, LIPand prior anorexogen use admitted with acute on chronic right heart failure and volume overload as well as AKI and thrombocytopenia.  IRparacentesis on6/10 5.5 Lremoved.    Interval Hx: Edemaimproved but persistentascites. + C diff infectiontreatment ongoing.  Dopamine is palliative. She is not a candidate for RVAD or lung transplant  Pt to be discharged today    Diet Rx: 2g Na   Nutritional Supplement Nepro or Novasource Renal; [employer] (Ne); Deliver Daily - Breakfast                      PO intake: pt reported good appetite. Pt said she had previously received diet education by RD, she does not have questions.   Tray Items Taken for the past 168 hrs:   Number of Items Taken Number of Items on Tray Diet Tolerance   07/08/17 1800 -- -- Tolerates   07/09/17 1500 3 3 Tolerates   07/10/17 0800 2 2 Tolerates   07/10/17 1500 3 3 Tolerates   07/11/17 0900 3 4 Tolerates   07/11/17 1255 3 5 Tolerates   07/12/17 0803 4 5 Tolerates   07/12/17 1300 3 4 Tolerates   07/12/17 1700 3 4 Tolerates   07/13/17 0920 4 4 Tolerates   07/13/17 1300 3 3 Tolerates   07/14/17 0901 2 2 Tolerates   07/14/17 1300 2 4 Tolerates   07/14/17 1728 2 2 Tolerates     Supplement Intake for the past 168 hrs:   Diet Supplements Supplement  - Intake (mL) Supplement Tolerance   07/09/17 0800 -- 0 mL --   07/09/17 1500 -- 0 mL --   07/10/17 0800 Nepro 0 mL --   07/10/17 2109 Nepro 200 mL Tolerated well   07/11/17 0900 Nepro -- --   07/12/17 0803 Nepro 0 mL --   07/13/17 0920 Nepro 240 mL Tolerated well   07/14/17 0800 Nepro 0 mL Other (Comment):     GI: no N/V, Abd Soft, Non distended, +BS, BM   Stool Assessment for the past 168 hrs:   Stool Appearance Stool Color Stool Amount Stool Source   07/09/17 0900 -- -- Medium --   07/11/17 0442 Soft formed Brown Medium Rectum   07/11/17 0700 Soft formed Brown Medium Rectum   07/11/17 0939 Partially liquid  Brown Medium Rectum   07/11/17 1300 Partially liquid Brown Small Rectum   07/11/17 2339 Partially liquid Brown Medium Rectum   07/12/17 0353 Partially liquid Brown Small Rectum   07/12/17 1230 Partially liquid Brown Medium Rectum   07/12/17 1324 Soft formed Brown Medium Rectum   07/12/17 1800 Soft formed Brown Medium Rectum   07/12/17 2237 Soft formed Brown Small Rectum   07/13/17 0105 Soft formed Brown Small Rectum   07/13/17 1136 -- -- -- Rectum   07/13/17 1622 -- -- -- Rectum   07/14/17 1609 Soft formed Brown Medium --   07/14/17 1855 Soft formed Brown Large Rectum   07/15/17 0552 Soft formed Brown Large Rectum            Hydration status  07/02 0600 - 07/03 0559  In: 783.3 [P.O.:597; I.V.:186.3]  Out: 1100 [Urine:1100]  Anthropometrics:  Height: 5' 2"  (157.5 cm)  Usual Body Wt:  At time of DTR IS 05/18/17 pt reported UBW 135-139lbs. Estimated dry weight ~129lbs per nephrology notes (possibly lower though).   WT Trend:  Stable around 51 kg in the past 5 days, lost 10 lb compared to 2 weeks ago, likely r/t fluid  Weights (last 14 days)     Date/Time Weight Weight Source    07/15/17 0600  50.9 kg (112 lb 3.4 oz)  Standing scale    07/14/17 0400  51.4 kg (113 lb 5.1 oz)  Standing scale    07/13/17 0521  51.6 kg (113 lb 12.1 oz)  Standing scale    07/12/17 0303  51.4 kg (113 lb 5.1 oz)  Standing scale    07/11/17 0436  51.7 kg (113 lb 15.7 oz)  Standing scale    07/10/17 0515  51 kg (112 lb 7 oz)  Standing scale    07/09/17 0400  --  Standing scale    07/09/17 0400  55.9 kg (123 lb 4.8 oz)  --    07/08/17 0431  57.3 kg (126 lb 5.2 oz)  Standing scale    07/07/17 0359  58.1 kg (128 lb 1.4 oz)  Standing scale    07/06/17 0313  58.4 kg (128 lb 12 oz)  Standing scale    07/05/17 0417  58.1 kg (128 lb)  Standing scale    07/04/17 0645  57.8 kg (127 lb 6.8 oz)  Standing scale    07/04/17 0604  -- pt refused to get up yet this am  --       07/03/17 0600  57.2 kg (126 lb 1.7 oz)  Standing scale    07/02/17 0809  57.3 kg (126  lb 5.2 oz)  Standing scale    07/01/17 0326  58.9 kg (129 lb 13.6 oz)  Standing scale           Nutrition-Focused Physical Exam:(07/01/17) - large ascites evident.  Subcutaneous fat loss: Orbital (Mild), Triceps/biceps (Severe), Thoracic/lumbar (moderate).   Muscle loss: Temple (moderate), Clavicle (moderate), Acromion process (moderate), Scapula (moderate), Interosseous (Severe), Patellar (Severe/Moderate), Quadriceps (Severe/Moderate), Calves (UTA d/t edema).   Potential micronutrient deficiencies: Multiple bruises/skin discoloring noted on arms - pt reported d/t pancytopenia. Hair appeared thin/brittle.  Edema: trace  Skin:  Laceration left toe, blister upper chest, right arm, incision on chest  Patient Lines/Drains/Airways Status    Active PICC Line / CVC Line / PIV Line / Drain / Airway / Intraosseous Line / Epidural Line / ART Line / Line Type / Wound     Name: Placement date: Placement time: Site: Days:    CVC Double Lumen - 07/09/17 Left Internal jugular  07/09/17   1119   Internal jugular  5    Impaired Skin Integrity -  Laceration Toe (Comment  which one) Left  06/07/17   2200   37    Impaired Skin Integrity -  Blister;Hematoma Chest Left;Upper  06/14/17   2000   30    Incision -  07/10/17 1139 Chest Left  07/10/17   1139   4                 Labs: reviewed  Recent Labs     07/13/17  1757 07/14/17  0530 07/14/17  1430   NA 133* 131* 133*   K 3.7 3.2* 3.8   CL 94* 94* 94*   BICARB 20* 17* 19*   BUN 130* 129* 132*   CREAT 2.43* 2.37* 2.46*   GLU 99 92 172*   [place] 8.9 8.5 8.7   MG 2.2 2.0 2.0   PHOS 6.9* 6.8* 6.9*     No results for  input(s): GLUCPOCT in the last 72 hours.  Lab Results   Component Value Date    A1C 5.5 07/20/2016     Lab Results   Component Value Date    VD2 <5 07/02/2017    VD3 38 07/02/2017    VDT 38 07/02/2017       Meds: reviewed   DOPamine 2 mcg/kg/min (07/14/17 1900)    epoprostenol (FLOLAN) infusion 35 ng/kg/min (07/14/17 1900)    sodium chloride        epoetin  alfa-epbx  4,000 Units Once per day on Fri    furosemide  100 mg TID    lansoprazole  30 mg QAM AC    medroxyPROGESTERone  1.25 mg Q24H NR    melatonin  5 mg HS    metolazone  5 mg Daily    Prostacyclin Cassette Change   Daily    Prostacyclin Tubing   Once per day on Mon Wed Fri    Prostacylin Batteries   Once per day on Mon    saccharomyces boulardii  250 mg BID    sildenafil  80 mg TID    sodium chloride (PF)  3 mL Q8H    spironolactone  75 mg BID    thiamine  100 mg Daily    vancomycin  125 mg BID    [START ON 07/26/2017] vancomycin  125 mg Daily    [START ON 08/03/2017] vancomycin  125 mg Q48H NR    vitamin B complex-vitamin C-folic acid  1 capsule Daily    vitamin b-12  50 mcg Daily      acetaminophen  650 mg Q6H PRN    bisacodyl  10 mg Daily PRN    hydrocortisone  1 Application W5Y PRN    nalOXone  0.1 mg Q2 Min PRN    ondansetron  4 mg Q6H PRN    senna  2 tablet Daily PRN    simethicone  80 mg Q6H PRN    sodium chloride (PF)  10 mL PRN    sodium chloride (PF)  3 mL PRN    sodium chloride   Continuous PRN     Estimated NutritionNeedsper Mifflin equation (58.9kg):1079kcal x1.4 - 1.7=1511-1834kcal/day (26-31kcal/kg).43- 59g/day protein (0.8-1.0g/kg- monitor renal fx) Maintenance fluids:Deferred to MD    Nutrition Dx: * Severe malnutrition r/t chronic illness AEB severe/moderate muscle/fat wasting noted per NFPE, severe fluid accumulations and hx of significant wt loss in the past year d/t a mix of fluid and true wt loss from medical complications    GOAL: (0/99) meet >80% of estimated needs w/ acceptable tolerance - met  Goal (6/26): To receive >80% of nutrition needs with acceptable tolerance (met)  Goal (7/3): PT to receive >80 % of estimated needs with acceptable tolerance    Intervention/Recommendation   - Continue 2 gm Na diet  - Weights q 48 hours via standing scale if feasible  Coordination of Care: Relayed plans and recommendations to care team members.   M/E:    Disposition: pending clinical course.   Education: pt does not have further questions prior to discharge    Will continue to follow patient per approved [place] Nutrition Prioritization Schedule guidelines.   Nutrition Services remains available via [place] should patient medical status change     Elizabeth Palau, MS, RD.  Pager # [phone]

## 2017-07-15 NOTE — Plan of Care (Signed)
Pt discharged to home, transported by husband [name]. Pt is A&OX3, VSS.  All discharge instructions reviewed with patient and husband. Discussed with patient - diet, activity, medications, and pain control. All questions answered regarding discharge.Pt. Is leaving hospital with dopamine and flolan gtt.  New cassettes changed and extra casette of floan given to patient on ice.  Pt is aware of follow-up appointment in a little less than a month.  Influenza/PNA vaccines not indicated. Prescriptions filled on site with the Procrit being filled at the patient's pharmacy in [place], [place]. All belongings taken with patient. Pt taken by wheelchair to car for transport home. Goal Met.

## 2017-07-15 NOTE — Interdisciplinary (Signed)
07/15/17 1335   Assessment   Assessment Type Discharge   Referral Information   Referral Type Discharge Planning   Plans/Interventions/Discharge   Anticipated Discharge Destination Home     Pt is going to be d/c home today and her husband is going to drive.   No other SW needs at this time.     Beverely Risen, MSW  Ph: [phone]  Pager: [phone]

## 2017-07-15 NOTE — Discharge Instructions (Signed)
You were admitted for pulmonary hypertension and worsening right heart failure.   You had paracentesis while you were admitted to help get fluid out of your abdomen.     Continue Flolan at 35 ng/kg/min   Star the Dopamine at 2 mcg/kg/min   Continue sildenafil - dose of 80 mg three times a day   Continue Lasix 100 mg 3x/day and Metolazone 5 mg once daily     You'll get labs once a week

## 2017-07-15 NOTE — Plan of Care (Signed)
Problem: Promotion of Health and Safety  Goal: Promotion of Health and Safety  Description  The patient remains safe, receives appropriate treatment and achieves optimal outcomes (physically, psychosocially, and spiritually) within the limitations of the disease process by discharge.    Information below is the current care plan.  Outcome: Discharged  Flowsheets  Taken 07/15/2017 0817  Patient /Family stated Goal: Go home this morning  Taken 07/15/2017 1457  Guidelines: Inpatient Nursing Guidelines  Outcome Evaluation (rationale for progressing/not progressing) every shift: Pt. discharged to home.

## 2017-07-17 LAB — ANAEROBIC CULTURE

## 2017-07-23 ENCOUNTER — Other Ambulatory Visit: Payer: Self-pay

## 2017-07-25 ENCOUNTER — Encounter (INDEPENDENT_AMBULATORY_CARE_PROVIDER_SITE_OTHER): Payer: Self-pay | Admitting: Diagnostic Radiology

## 2017-07-25 NOTE — Progress Notes (Signed)
This is a second attempt to get a note into the patient's medical record regarding conversation that I had with the patient and her husband regarding the [place] catheter that could not be removed.  The initial conversation was with the patient in the procedure room, but I also had a conversation with the patient and her husband on 07/10/17, the day after the procedure was performed.  The following is a copy of what I thought I had gotten documented in the patient's chart while she was in the hospital.    I  have had a long discussion with patient and husband. I explained to them that the catheter is stuck in the vein.  That it cant be removed without major surgery to the vein, which is not appropriate for her.    I told them that the catheter was retracted as much as possible from the vein wall, then sutured closed and cut and allowed to retract into the soft tissues.    The rest of the catheter remains in the venous system and will not be able to be removed.     I have warned both of them that if a chest x-ray is done, that someone might tell them that there is a catheter in place in the venous system, and that the end is in the soft tissues. I have told them, that this catheter should not be attempted to be removed, unless it were to be done surgically, no one should try to remove it percutaneously.    The patient and her husband appeared to understand the situation.  They are aware that the catheter had been in place for many years and had functioned well, but that it would have probably scarred into the vein wall, and they understand the concern for damaging the wall of the vein.  I answered their questions, and they seemed comfortable with the situation.

## 2017-07-28 ENCOUNTER — Other Ambulatory Visit: Payer: Self-pay

## 2017-08-10 ENCOUNTER — Inpatient Hospital Stay
Admission: EM | Admit: 2017-08-10 | Discharge: 2017-08-13 | DRG: 919 | Disposition: A | Discharge status: Home Health | Payer: PPO | Attending: Pulmonary Medicine | Admitting: Pulmonary Medicine

## 2017-08-10 ENCOUNTER — Inpatient Hospital Stay (HOSPITAL_COMMUNITY): Payer: PPO

## 2017-08-10 ENCOUNTER — Emergency Department (HOSPITAL_BASED_OUTPATIENT_CLINIC_OR_DEPARTMENT_OTHER): Payer: PPO

## 2017-08-10 DIAGNOSIS — E872 Acidosis: Secondary | ICD-10-CM | POA: Diagnosis present

## 2017-08-10 DIAGNOSIS — Z8619 Personal history of other infectious and parasitic diseases: Secondary | ICD-10-CM

## 2017-08-10 DIAGNOSIS — Z9981 Dependence on supplemental oxygen: Secondary | ICD-10-CM

## 2017-08-10 DIAGNOSIS — S1093XA Contusion of unspecified part of neck, initial encounter: Secondary | ICD-10-CM

## 2017-08-10 DIAGNOSIS — Z79890 Hormone replacement therapy (postmenopausal): Secondary | ICD-10-CM

## 2017-08-10 DIAGNOSIS — I272 Pulmonary hypertension, unspecified: Secondary | ICD-10-CM

## 2017-08-10 DIAGNOSIS — Z888 Allergy status to other drugs, medicaments and biological substances status: Secondary | ICD-10-CM

## 2017-08-10 DIAGNOSIS — L7632 Postprocedural hematoma of skin and subcutaneous tissue following other procedure: Principal | ICD-10-CM | POA: Diagnosis present

## 2017-08-10 DIAGNOSIS — Y92239 Unspecified place in hospital as the place of occurrence of the external cause: Secondary | ICD-10-CM | POA: Diagnosis present

## 2017-08-10 DIAGNOSIS — R0902 Hypoxemia: Secondary | ICD-10-CM

## 2017-08-10 DIAGNOSIS — D696 Thrombocytopenia, unspecified: Secondary | ICD-10-CM | POA: Diagnosis present

## 2017-08-10 DIAGNOSIS — Z818 Family history of other mental and behavioral disorders: Secondary | ICD-10-CM

## 2017-08-10 DIAGNOSIS — D649 Anemia, unspecified: Secondary | ICD-10-CM | POA: Diagnosis present

## 2017-08-10 DIAGNOSIS — E43 Unspecified severe protein-calorie malnutrition: Secondary | ICD-10-CM | POA: Diagnosis present

## 2017-08-10 DIAGNOSIS — Z882 Allergy status to sulfonamides status: Secondary | ICD-10-CM

## 2017-08-10 DIAGNOSIS — E871 Hypo-osmolality and hyponatremia: Secondary | ICD-10-CM | POA: Diagnosis present

## 2017-08-10 DIAGNOSIS — Z1889 Other specified retained foreign body fragments: Secondary | ICD-10-CM

## 2017-08-10 DIAGNOSIS — J9611 Chronic respiratory failure with hypoxia: Secondary | ICD-10-CM | POA: Diagnosis present

## 2017-08-10 DIAGNOSIS — N179 Acute kidney failure, unspecified: Secondary | ICD-10-CM | POA: Diagnosis present

## 2017-08-10 DIAGNOSIS — E8770 Fluid overload, unspecified: Secondary | ICD-10-CM | POA: Diagnosis present

## 2017-08-10 DIAGNOSIS — Z8701 Personal history of pneumonia (recurrent): Secondary | ICD-10-CM

## 2017-08-10 DIAGNOSIS — I50812 Chronic right heart failure: Secondary | ICD-10-CM | POA: Diagnosis present

## 2017-08-10 DIAGNOSIS — Z8249 Family history of ischemic heart disease and other diseases of the circulatory system: Secondary | ICD-10-CM

## 2017-08-10 DIAGNOSIS — Z881 Allergy status to other antibiotic agents status: Secondary | ICD-10-CM

## 2017-08-10 DIAGNOSIS — Z681 Body mass index (BMI) 19 or less, adult: Secondary | ICD-10-CM

## 2017-08-10 DIAGNOSIS — Y848 Other medical procedures as the cause of abnormal reaction of the patient, or of later complication, without mention of misadventure at the time of the procedure: Secondary | ICD-10-CM | POA: Diagnosis present

## 2017-08-10 DIAGNOSIS — Z87891 Personal history of nicotine dependence: Secondary | ICD-10-CM

## 2017-08-10 DIAGNOSIS — M329 Systemic lupus erythematosus, unspecified: Secondary | ICD-10-CM | POA: Diagnosis present

## 2017-08-10 DIAGNOSIS — I2721 Secondary pulmonary arterial hypertension: Secondary | ICD-10-CM | POA: Diagnosis present

## 2017-08-10 DIAGNOSIS — N189 Chronic kidney disease, unspecified: Secondary | ICD-10-CM | POA: Diagnosis present

## 2017-08-10 DIAGNOSIS — Z66 Do not resuscitate: Secondary | ICD-10-CM | POA: Diagnosis present

## 2017-08-10 LAB — BASIC METABOLIC PANEL, BLOOD
Anion Gap: 24 mmol/L — ABNORMAL HIGH (ref 7–15)
BUN: 157 mg/dL — ABNORMAL HIGH (ref 8–23)
Bicarbonate: 12 mmol/L — ABNORMAL LOW (ref 22–29)
Calcium: 8.6 mg/dL (ref 8.5–10.6)
Chloride: 94 mmol/L — ABNORMAL LOW (ref 98–107)
Creatinine: 3.46 mg/dL — ABNORMAL HIGH (ref 0.51–0.95)
GFR: 13 mL/min
Glucose: 110 mg/dL — ABNORMAL HIGH (ref 70–99)
Potassium: 4 mmol/L (ref 3.5–5.1)
Sodium: 130 mmol/L — ABNORMAL LOW (ref 136–145)

## 2017-08-10 LAB — CBC WITH DIFF, BLOOD
ANC-Manual Mode: 4.8 10*3/uL (ref 1.6–7.0)
Abs Basophils: 0 10*3/uL (ref <0.1)
Abs Eosinophils: 0 10*3/uL (ref 0.1–0.5)
Abs Lymphs: 0.6 10*3/uL — ABNORMAL LOW (ref 0.8–3.1)
Abs Monos: 0.1 10*3/uL — ABNORMAL LOW (ref 0.2–0.8)
Basophils: 0 %
Eosinophils: 0 %
Hct: 28.3 % — ABNORMAL LOW (ref 34.0–45.0)
Hgb: 9 gm/dL — ABNORMAL LOW (ref 11.2–15.7)
Lymphocytes: 11 %
MCH: 28.7 pg (ref 26.0–32.0)
MCHC: 31.8 g/dL — ABNORMAL LOW (ref 32.0–36.0)
MCV: 90.1 um3 (ref 79.0–95.0)
Monocytes: 1 %
Plt Count: 26 10*3/uL — ABNORMAL LOW (ref 140–370)
RBC: 3.14 10*6/uL — ABNORMAL LOW (ref 3.90–5.20)
RDW: 19.2 % — ABNORMAL HIGH (ref 12.0–14.0)
Segs: 86 %
WBC: 5.6 10*3/uL (ref 4.0–10.0)

## 2017-08-10 LAB — MDIFF
Immature Granulocytes Absolute Manual: 0.1 10*3/uL (ref 0.0–0.1)
Metamyelocytes: 2 %
Number of Cells Counted: 115
Plt Est: DECREASED

## 2017-08-10 LAB — LIVER PANEL, BLOOD
ALT (SGPT): 12 U/L (ref 0–33)
AST (SGOT): 21 U/L (ref 0–32)
Albumin: 3.6 g/dL (ref 3.5–5.2)
Alkaline Phos: 112 U/L (ref 35–140)
Bilirubin, Dir: <0.2 mg/dL (ref <0.2)
Bilirubin, Tot: 0.47 mg/dL (ref <1.2)
Total Protein: 7.2 g/dL (ref 6.0–8.0)

## 2017-08-10 LAB — MAGNESIUM, BLOOD: Magnesium: 2.3 mg/dL (ref 1.6–2.4)

## 2017-08-10 LAB — PHOSPHORUS, BLOOD: Phosphorous: 9.1 mg/dL (ref 2.7–4.5)

## 2017-08-10 LAB — PROTHROMBIN TIME, BLOOD
INR: 1.4
PT,Patient: 16.2 s — ABNORMAL HIGH (ref 9.7–12.5)

## 2017-08-10 MED ORDER — SEVELAMER CARBONATE 800 MG OR TABS
800.00 mg | ORAL_TABLET | Freq: Three times a day (TID) | ORAL | Status: DC
Start: 2017-08-11 — End: 2017-08-13
  Administered 2017-08-13: 800 mg via ORAL
  Filled 2017-08-10 (×3): qty 1

## 2017-08-10 MED ORDER — FUROSEMIDE 40 MG OR TABS
100.00 mg | ORAL_TABLET | Freq: Three times a day (TID) | ORAL | Status: DC
Start: 2017-08-11 — End: 2017-08-13
  Administered 2017-08-11 – 2017-08-13 (×7): 100 mg via ORAL
  Filled 2017-08-10 (×7): qty 1

## 2017-08-10 MED ORDER — METOLAZONE 2.5 MG OR TABS
5.00 mg | ORAL_TABLET | Freq: Every day | ORAL | Status: DC
Start: 2017-08-11 — End: 2017-08-11
  Administered 2017-08-11: 5 mg via ORAL
  Filled 2017-08-10: qty 2

## 2017-08-10 MED ORDER — PROSTACYCLIN TUBING (~~LOC~~)
Status: DC
Start: 2017-08-10 — End: 2017-08-13
  Administered 2017-08-10 – 2017-08-12 (×2)
  Filled 2017-08-10: qty 1

## 2017-08-10 MED ORDER — SPIRONOLACTONE 25 MG OR TABS
75.00 mg | ORAL_TABLET | Freq: Two times a day (BID) | ORAL | Status: DC
Start: 2017-08-11 — End: 2017-08-13
  Administered 2017-08-11 – 2017-08-13 (×5): 75 mg via ORAL
  Filled 2017-08-10 (×5): qty 3

## 2017-08-10 MED ORDER — SODIUM CHLORIDE 0.9% TKO INFUSION
INTRAVENOUS | Status: DC | PRN
Start: 2017-08-10 — End: 2017-08-13

## 2017-08-10 MED ORDER — SODIUM CHLORIDE 0.9 % IJ SOLN (CUSTOM)
3.0000 mL | Freq: Three times a day (TID) | INTRAMUSCULAR | Status: DC
Start: 2017-08-10 — End: 2017-08-13
  Administered 2017-08-11 – 2017-08-12 (×4): 3 mL via INTRAVENOUS

## 2017-08-10 MED ORDER — NALOXONE HCL 0.4 MG/ML IJ SOLN
0.1000 mg | INTRAMUSCULAR | Status: DC | PRN
Start: 2017-08-10 — End: 2017-08-13

## 2017-08-10 MED ORDER — HELP
4.00 | Freq: Three times a day (TID) | Status: DC
Start: 2017-08-11 — End: 2017-08-11

## 2017-08-10 MED ORDER — DOPAMINE IN D5W 1.6-5 MG/ML-% IV SOLN
2.00 ug/kg/min | INTRAVENOUS | Status: DC
Start: 2017-08-10 — End: 2017-08-13
  Administered 2017-08-11 – 2017-08-13 (×8): 2 ug/kg/min via INTRAVENOUS
  Filled 2017-08-10 (×2): qty 250

## 2017-08-10 MED ORDER — VITAMIN D 1000 UNIT OR TABS
2000.00 [IU] | ORAL_TABLET | Freq: Every day | ORAL | Status: DC
Start: 2017-08-11 — End: 2017-08-13
  Administered 2017-08-11 – 2017-08-13 (×3): 2000 [IU] via ORAL
  Filled 2017-08-10 (×3): qty 2

## 2017-08-10 MED ORDER — AZELASTINE HCL 137 MCG/SPRAY NA SOLN
1.00 | Freq: Two times a day (BID) | NASAL | Status: DC
Start: 2017-08-11 — End: 2017-08-13
  Administered 2017-08-11 – 2017-08-13 (×3): 1 via NASAL
  Filled 2017-08-10: qty 30

## 2017-08-10 MED ORDER — SODIUM CHLORIDE 0.9 % IJ SOLN (CUSTOM)
3.0000 mL | INTRAMUSCULAR | Status: DC | PRN
Start: 2017-08-10 — End: 2017-08-13

## 2017-08-10 MED ORDER — PROSTACYCLIN BATTERIES - PUMP (~~LOC~~)
Status: DC
Start: 2017-08-10 — End: 2017-08-13
  Administered 2017-08-10: 22:00:00
  Filled 2017-08-10: qty 1

## 2017-08-10 MED ORDER — GLYCINE DILUENT IV SOLN
35.0000 ng/kg/min | INTRAVENOUS | Status: DC
Start: 2017-08-10 — End: 2017-08-13
  Administered 2017-08-10 – 2017-08-13 (×8): 35 ng/kg/min via INTRAVENOUS
  Filled 2017-08-10 (×5): qty 6

## 2017-08-10 MED ORDER — PROSTACYCLIN CASSETTE CHANGE (~~LOC~~)
Freq: Every day | Status: DC
Start: 2017-08-11 — End: 2017-08-13
  Administered 2017-08-11 – 2017-08-12 (×2)
  Filled 2017-08-10: qty 1

## 2017-08-10 NOTE — ED Triage Notes (Signed)
Pt. On flolan & dopamine drip

## 2017-08-10 NOTE — ED Notes (Addendum)
Pt sat 99% on NC 3L.  ST 114  NAD.    Pt had nosebleed onset 11am Sunday. Over the past few days she has developed a hematoma to her L clavicle.

## 2017-08-10 NOTE — ED Notes (Addendum)
Dopamine Pump:  Checked with [place]: 95.51mL  Continuous: 4.22mL/hr  Given: 598.62mL  Changed at: 11:00am

## 2017-08-10 NOTE — ED Notes (Signed)
Bed: 04  Expected date:   Expected time:   Means of arrival:   Comments:  For triage/ flolan pt.

## 2017-08-10 NOTE — ED Notes (Signed)
MD Childers at bedside.

## 2017-08-10 NOTE — Progress Notes (Signed)
Prostacyclin (Flolan/Veletri/Remodulin)      Prostacyclin MCP 333.1      Prostacyclin: Flolan - IV    Prostacyclin Dosing Weight: 86 kg     Dose: 35 ng/kg/min    Concentration:  60,000 ng/mL    Rate: Flolan/Veletri/Remodulin IV CADD:  72 mL/24hr    Last Changed: 08/10/17 @1000   62.3 mLs remaining at 2145  Changes every 1 days    Has 2 pumps in date?: yes  1). Serial # [id number] Due Date: 11/11/2017  2). 2nd pump in car, husband to bring in prior to patient going to floor  Pump Company: [employer], [place]

## 2017-08-10 NOTE — ED Floor Report (Addendum)
ED to IP Handoff    Report created by Dartha Lodge, RN at 10:50 PM 08/10/2017.     HANDOFF REPORT UPDATE/CHANGES (changes in patient status/care/events prior to transfer)  By who:  Time:   Additional information:                                                                                                                                                     Diana Chambers is a 67 year old female.    Brief Summary of ED Visit (to include focused assessment and neuro status):   per provider note:  "67 year female with pulmonary hypertension on Flolan and dopamine here with neck swelling. She had central line that had been in her left IJ for years partly removed earlier this month, replaced by another central line.  That fell out spontaneously.  She went to a [facility] where a central line was placed on the right, and at this point the long-term line that we had been unable to remove was removed.  Patient was fine for approximately week after that, but no swelling of her left anterior neck yesterday.  She has had some swelling at the site chronically, but was worse yesterday, slightly better today.  Other than that, she does have some epistaxis last episode 1 week ago.  Otherwise without complaints denies headache focal neurologic changes chest pain shortness of breath fever or other complaints.  She is at her baseline."    ED COURSE: labs      RN shift assessment exceptions to WDL: golf ball size hematoma to L clavicle, diffuse redness and bruising to arms/chest/abd legs, wears glasses, unsteady gait, round soft abd, small stage 1 PU to coccyx    Any significant events and interventions with responses:  None    Radiologic studies not completed: CT &US  (None unless otherwise noted)    Chief Complaint   Patient presents with    Nose Bleeds     x1 week , also noted hematoma on L upper chest yesterday, states " I used to have a catheter on that site." Was se in ER yesterday, also states  was in ICU last week d/t nose bleed. Pt on flolan, denie CP or SOB, uses home O2 at 3 lpm/nc, denies nose bleed today       Admitted for: Pulm HTN/Hematoma    Code Status:  Please refer to In-pt admitting doctors orders     Level of Care: Tele     Is patient septic? no If yes, complete below:    BC x 2 drawn? no  If No explain:  n/a    Repeat lactate needed? no  If Yes, when is it due?  n.a    All initial antibiotics given?  no  If No, explain:  n/a  Amount of IV fluids received 0 ml    Is patient on Heparin? no If yes, complete below:     Time Heparin bolus was given: none    Additional drips patient is on: n/a    Cardiac rhythm: NSR    Oxygen Delivery: Nasal Cannula 3 L/Min    Past Medical History:   Diagnosis Date    Pulmonary HTN (CMS-HCC)     Sjoegren syndrome (CMS-HCC) 07/12/2009       No past surgical history on file.    Allergies: Allopurinol; Bumex [bumetanide]; Levaquin; Ofloxacin; Chlorhexidine; and Sulfa drugs    ED Fall Risk: (!) Yes    Skin issues:  yes    >> If yes, note areas of skin breakdown. See appropriate photos.      Ambulatory:  Yes but very unsteady, does not use assistive device at home    Sitter needed: no    Suicide Risk:  no    Isolation Required: yes     >> If yes , what type of isolation: c. diff    Is patient in custody?  no    Is patient in restraints? no    Vitals:    08/10/17 2037 08/10/17 2156 08/10/17 2159   BP: 104/68 98/57    BP Location:  Left arm    BP Patient Position:  Lying right side    Pulse: 96 (!) 132    Resp: 22     Temp: 98.3 F (36.8 C)     SpO2: 95% 100% 100%   Weight: 48.2 kg (106 lb 4 oz)     Height: 5\' 2"  (1.575 m)         Tubes Collected: (S) Lavender, Blue, Purple (Blood Bank), Yellow SST, Green PST (08/10/17 2133 : Leta Speller, RN)    Lab Results   Component Value Date    WBC 5.6 08/10/2017    RBC 3.14 (L) 08/10/2017    HGB 9.0 (L) 08/10/2017    HCT 28.3 (L) 08/10/2017    MCV 90.1 08/10/2017    MCHC 31.8 (L) 08/10/2017    RDW 19.2 (H) 08/10/2017    PLT 26 (L) 08/10/2017       Lab Results   Component Value Date    NA 130 (L) 08/10/2017    K 4.0 08/10/2017    CL 94 (L) 08/10/2017    BICARB 12 (L) 08/10/2017    BUN 157 (H) 08/10/2017    CREAT 3.46 (H) 08/10/2017    GLU 110 (H) 08/10/2017    Advance 8.6 08/10/2017       Lab Results   Component Value Date    PHOS 9.1 (HH) 08/10/2017    MG 2.3 08/10/2017       No results found for: CPK, CKMBH, TROPONIN    No results found for: PH, PCO2, O2CONTENT, IVHC3, IVBE, O2SAT, UNPH, UNPCO2, ARTPH, ARTPCO2, ARTO2CNT, IAHC3, IABE, ARTO2SAT, UNAPH, UNAPCO2    No results found for this visit on 08/10/17.      Patient Lines/Drains/Airways Status    Active PICC Line / CVC Line / PIV Line / Drain / Airway / Intraosseous Line / Epidural Line / ART Line / Line Type / Wound     Name: Placement date: Placement time: Site: Days:    Peripheral IV - 20 G Right Antecubital  08/10/17   2122   Antecubital  less than 1  ED Handoff Report is ready for review.  Admitting RN may reach Emergency Department RN, Dartha Lodge, RN, at [phone] with any questions.

## 2017-08-10 NOTE — H&P (Signed)
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     On 07/07, she notes that her line "fell out." On 07/08, this line was replaced on R at OSH. During this placement the remainder of the catheter in L chest was removed (Per 07/13 IR Note, the remainder of this catheter had been stuck in the vein. She was informed that it could not be removed without major surgical intervention. Nobody should attempt to remove the remainder of this catheter)    Patient reports hospitalization on 07/21 - 07/26 for epistaxis requiring rhino rocket.     Yesterday, she presented due to L chest swelling and associated pain at site of prior catheter. She was concerned for hematoma. Family notes that it has decreased in size today. Denies F, chills, night sweats.,     Denies chest pain, shortness of breath, cough, N/V. On ROS, she reports significant weight loss due to poor PO intake. She attributes this poor PO intake due to PO Vancomycin for C difficile. She does not want to continue taking PO Vancomycin. She notes that OSH hospital performed C difficile test on her diarrhea, which was negative. Last diarrhea was yesterday without episode the day prior.     ED COURSE  In the Emergency Department, she was afebrile, tachycardiac hemodynamically stable on 3L. Laboratory studies were notable for hyponatremia 130, AGAP 24 with bicarbonate 12, BUN/Cr 157/3.46 (2.46), hyperphosphatemia 9.1, normocytic anemia 9.0, thrombocytopenia 26. INR was 1.4    PAST MEDICAL HISTORY/PAST SURGICAL HISTORY  Past Medical History:   Diagnosis Date    Pulmonary HTN (CMS-HCC)     Sjoegren syndrome (CMS-HCC) 07/12/2009     No past  surgical history on file.  Patient Active Problem List    Diagnosis Date Noted    Edema 05/16/2017    Thrombocytopenia (CMS-HCC) 07/15/2016    PAH (pulmonary artery hypertension) (CMS-HCC) 05/27/2016    Volume overload 05/02/2016    Systemic lupus erythematosus (CMS-HCC) 10/08/2011    Moraxella catarrhalis pneumonia (CMS-HCC) 06/21/2011    Pneumonia 06/15/2011    Hypoxia 06/15/2011    Chronic right-sided congestive heart failure (CMS-HCC) 06/15/2011    Chest pain 06/07/2011    Bilateral edema of lower extremity 06/07/2011    Sjoegren syndrome 07/12/2009    PAH (PULMONARY ARTERY HYPERTENSION) 08/07/2005     FAMILY HISTORY  Family History   Problem Relation Name Age of Onset    Heart Disease Mother      Hypertension Mother      Psychiatry Mother      Heart Disease Father       SOCIAL HISTORY  Social History     Socioeconomic History    Marital status: Married     Spouse name: Not on file    Number of children: Not on file    Years of education: Not on file    Highest education level: Not on file   Occupational History    Not on file   Social Needs    Financial resource strain: Not on file    Food insecurity:     Worry: Not on file     Inability: Not on file    Transportation needs:  Medical: Not on file     Non-medical: Not on file   Tobacco Use    Smoking status: Former Smoker     Packs/day: 0.50     Years: 5.00     Pack years: 2.50     Last attempt to quit: 01/13/1978     Years since quitting: 39.6    Smokeless tobacco: Never Used   Substance and Sexual Activity    Alcohol use: No    Drug use: Not on file    Sexual activity: Not on file   Lifestyle    Physical activity:     Days per week: Not on file     Minutes per session: Not on file    Stress: Not on file   Relationships    Social connections:     Talks on phone: Not on file     Gets together: Not on file     Attends religious service: Not on file     Active member of club or organization: Not on file     Attends meetings of  clubs or organizations: Not on file     Relationship status: Not on file    Intimate partner violence:     Fear of current or ex partner: Not on file     Emotionally abused: Not on file     Physically abused: Not on file     Forced sexual activity: Not on file   Other Topics Concern    Not on file   Social History Narrative    Not on file     MEDICATIONS/ALLERGIES  Allergies   Allergen Reactions    Allopurinol Hives    Bumex [Bumetanide] Other     Muscle aches and stiffness      Levaquin Swelling and Other     Severe joint pain      Ofloxacin Nausea and Vomiting    Chlorhexidine Itching    Sulfa Drugs Unspecified     No current facility-administered medications on file prior to encounter.      Current Outpatient Medications on File Prior to Encounter   Medication Sig Dispense Refill    azelastine (ASTELIN) 0.1 % nasal spray Spray 1 spray into each nostril 2 times daily. Use in each nostril as directed 1 bottle 3    [DISCONTINUED] bumetanide (BUMEX) 2 MG tablet TAKE 1 TABLET BY MOUTH 3 TIMES DAILY. 90 each 0    Cholecalciferol 2000 UNITS CAPS Take 1 capsule by mouth daily.      [DISCONTINUED] colchicine 0.6 MG tablet TAKE 1 TABLET BY MOUTH DAILY. 30 each 0    DOPamine (INTROPIN) 400 mg/250 mL infusion Inject 123 mcg/min into vein continuous. 100 mL 1    epoetin alfa-epbx (RETACRIT) 4000 UNIT/ML Inject 1 mL (4,000 Units) under the skin once a week. 4 mL 3    estradiol (ESTRING) 2 MG vaginal ring Insert 1 Ring vaginally Every 3 months. Follow package directions      ferrous sulfate 324 (65 FE) MG TBEC Take 1 tablet (324 mg) by mouth daily. 30 tablet 0    FLOLAN 1.5 MG IV SOLR 29ng/kg/min      furosemide (LASIX) 40 MG tablet Take 2.5 tablets (100 mg) by mouth 3 times daily. 200 tablet 3    medroxyPROGESTERone (PROVERA) 2.5 MG tablet Take 1.25 mg by mouth daily.      metolazone (ZAROXOLYN) 5 MG tablet Take 1 tablet (5 mg) by mouth daily. 30 tablet 1  potassium chloride (KLOR-CON M20) 20 MEQ tablet  Take 1 tablet by mouth 2 times daily. 60 tablet 1    sildenafil (REVATIO, VIAGRA) 20 MG tablet Take 4 tablets (80 mg) by mouth 3 times daily. 360 tablet 2    spironolactone (ALDACTONE) 50 MG tablet Take one and a half tablets (75 mg) by mouth 2 times daily. 90 tablet 1    vancomycin (VANCOCIN) 125 MG capsule Take 1 capsule (125 mg) by mouth 2 times daily for 11 days, then 1 capsule once daily for 7 days, then 1 capsule every 48 hours for 13 days. 36 capsule 0    vitamin B complex-vitamin C-folic acid (NEPHROCAPS) 1 MG capsule Take 1 capsule by mouth daily. 30 capsule 0    vitamin b-12 (CYANOCOBALAMIN) 500 MCG tablet TAKE 1 TABLET BY MOUTH DAILY. 30 each 3     VITALS  Vitals:    08/10/17 2037   BP: 104/68   Pulse: 96   Resp: 22   Temp: 98.3 F (36.8 C)   SpO2: 95%   Weight: 48.2 kg (106 lb 4 oz)   Height: 5\' 2"  (1.575 m)     Temperature:  [98.3 F (36.8 C)] 98.3 F (36.8 C) (07/29 2037)  Blood pressure (BP): (104)/(68) 104/68 (07/29 2037)  Heart Rate:  [96] 96 (07/29 2037)  Respirations:  [22] 22 (07/29 2037)  Pain Score: 3 (07/29 2037)  O2 Device: Nasal cannula (07/29 2037)  O2 Flow Rate (L/min):  [3 l/min] 3 l/min (07/29 2037)  SpO2:  [95 %] 95 % (07/29 2037)     WEIGHTS  Weights (last 14 days)     Date/Time Weight Weight Source Percentage Weight Change (%) Who    08/10/17 2037  48.2 kg (106 lb 4 oz)  Standing scale  0 % MT            PHYSICAL EXAM  GEN: Pleasant, chronically ill-appearing, female in no acute distress, speaking in full sentences without use of accessory muscles   Eyes: Anicteric sclera. Extraocular movements intact  Skin: Evidence of ecchymoses throughout body. 4 x 2 cm swelling with ecchymoses.   CV: Normal S1 and S2. Systolic murmur. Distal radial pulses wnl.  RESP: Scattered crackles  ABD: Soft, non-tender. Distended. Normoactive bowel sounds  NEURO: Grossly moving all extremities  EXT: 1+ peripheral edema     LABS  Lab Results   Component Value Date    BNP 53 10/09/2008    BNPP 32,908  (H) 05/15/2017    BNPP 13,583 (H) 08/23/2016    BNPP 13,224 (H) 08/13/2016     PREVIOUS MICRO  Lab Results   Component Value Date    BLOODCULT No Growth after 5 day/s of incubation. 06/15/2011    BLOODCULT No Growth after 5 day/s of incubation. 06/15/2011    URINECULTURE (A) 06/11/2016     Staphylococcus aureus  >100,000 colonies/mL  Two colony types; same susceptibility pattern  Identification performed by [employer] [facility]( Maldi-ToF). This test  was developed and its performance characteristics determined by [facility]. It has not been cleared or  approved by the U.S. Food and Drug Administration.  The FDA has  determined that such clearance or approval is not necessary.     .  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     # Hematoma   # Thrombocytopenia  - Monitor CBC. Transfuse for Hgb >7. Hold off on platelet transfusion unless actively bleeding, given risk for worsening pulmonary vasoconstriction  - Monitor for signs of superimposed cellulitis requiring antibiotics  - Obtain US, non-contrast CT   - Discuss with IR in AM, pending imaging   - Low threshold to initiate antibiotics for superimposed cellulitis     # SLE complicated by WHO Group I PAH   # Chronic hypoxemic respiratory failure on 3L  # Chronic RV failure   # Hyponatremia, likely hypervolemic  - Follow-up BNP   - Monitor strict I/O and daily weights  -Continuedopamine @2  mcg/kg/min  - Continue diuresis (metolazone, PO furosemide TID, PO spironolactone BID)   - Continue sildenafil, IV epoprostenol  @35  ng/kg/min    # AKI on CKD (baseline Cr 1.3), likely cardiorenal   # Metabolic acidosis  # Hyperphosphatemia  - Monitor UOP  - Diuresis as above  - Initiate bicarbonate and phosphate binder  - Avoiding other nephrotoxic agents  - Consider Nephrology consultation     # History of C difficile colitis (Should receive 2 additional doses of PO Vancomycin q48h. However, patient declined and OSH reportedly C difficile negative), monitoring stool output   # Epistaxis, on azelastine  # Malnutrition    Electrolytes: Monitoring with repletion PRN.   Nutrition: Renal diet with 1.5L fluid restriction  Glycemic control: SSI  Pain control: acetaminophen PRN  Bowels: PRN  GI PPx: Not indicated  DVT PPx: SCDs. Holding pharmacologic PPx, given thrombocytopenia  Mobility: Per RN    This patient will be seen by my supervising attending physician tomorrow AM    Roseanne Reno, MD  Fellow, Pulmonary and [place]   [facility]

## 2017-08-10 NOTE — ED Notes (Addendum)
Pulmonary HTN Notification    Nursing Supervisor paged, Pulmonary HTN RN paged, Pharmacy notified, and Charge Nurse notified of pt arrival in ED.yes - [place] paged     Does medication have enough ice/is it cold enough?no  Is pump on run: yes - running  Reserve volume:65.1  ml  Amount given: 32.35 ml  The amount the pump delivers 83mL/24hrs:  Pharmacy has pt's dosing weight:  106.4lbs.   Pharmacy has dosage: TBD nanograms/kg  Prostacyclin was last changed at: 10:00am today    Checked with Drucie Opitz. Double check.    Pulmonary MD at bedside.

## 2017-08-10 NOTE — ED Notes (Signed)
Shaune Pascal at bedside.

## 2017-08-10 NOTE — ED Notes (Signed)
ASSISTING PRIMARY RN  PT ASSISTED TO BEDSIDE COMMODE  HUSBAND REMAINS WITH PT AND WILL CALL WHEN DONE

## 2017-08-10 NOTE — ED Provider Notes (Signed)
History  Chief Complaint   Patient presents with    Nose Bleeds     x1 week , also noted hematoma on L upper chest yesterday, states " I used to have a catheter on that site." Was se in ER yesterday, also states was in ICU last week d/t nose bleed. Pt on flolan, denie CP or SOB, uses home O2 at 3 lpm/nc, denies nose bleed today     HPI  67 year female with pulmonary hypertension on Flolan and dopamine here with neck swelling. She had central line that had been in her left IJ for years partly removed earlier this month, replaced by another central line.  That fell out spontaneously.  She went to a [facility] where a central line was placed on the right, and at this point the long-term line that we had been unable to remove was removed.  Patient was fine for approximately week after that, but no swelling of her left anterior neck yesterday.  She has had some swelling at the site chronically, but was worse yesterday, slightly better today.  Other than that, she does have some epistaxis last episode 1 week ago.  Otherwise without complaints denies headache focal neurologic changes chest pain shortness of breath fever or other complaints.  She is at her baseline.    Past Medical History:   Diagnosis Date    Pulmonary HTN (CMS-HCC)     Sjoegren syndrome (CMS-HCC) 07/12/2009       No past surgical history on file.    Family History   Problem Relation Name Age of Onset    Heart Disease Mother      Hypertension Mother      Psychiatry Mother      Heart Disease Father         Social History     Tobacco Use    Smoking status: Former Smoker     Packs/day: 0.50     Years: 5.00     Pack years: 2.50     Last attempt to quit: 01/13/1978     Years since quitting: 39.6    Smokeless tobacco: Never Used   Substance Use Topics    Alcohol use: No    Drug use: Not on file       Home Medication List  Prior to Admission Medications   Prescriptions Last Dose Informant Patient Reported? Taking?   Cholecalciferol 2000  UNITS CAPS Taking  Yes Yes   Sig: Take 1 capsule by mouth daily.   DOPamine (INTROPIN) 400 mg/250 mL infusion Taking  No Yes   Sig: Inject 123 mcg/min into vein continuous.   FLOLAN 1.5 MG IV SOLR Taking  Yes Yes   Sig: 29ng/kg/min   azelastine (ASTELIN) 0.1 % nasal spray Taking  No Yes   Sig: Spray 1 spray into each nostril 2 times daily. Use in each nostril as directed   epoetin alfa-epbx (RETACRIT) 4000 UNIT/ML Taking  No Yes   Sig: Inject 1 mL (4,000 Units) under the skin once a week.   estradiol (ESTRING) 2 MG vaginal ring Taking  Yes Yes   Sig: Insert 1 Ring vaginally Every 3 months. Follow package directions   ferrous sulfate 324 (65 FE) MG TBEC Taking  No Yes   Sig: Take 1 tablet (324 mg) by mouth daily.   furosemide (LASIX) 40 MG tablet Taking  No Yes   Sig: Take 2.5 tablets (100 mg) by mouth 3 times daily.   medroxyPROGESTERone (PROVERA) 2.5 MG  tablet Taking  Yes Yes   Sig: Take 1.25 mg by mouth daily.   metolazone (ZAROXOLYN) 5 MG tablet Taking  No Yes   Sig: Take 1 tablet (5 mg) by mouth daily.   potassium chloride (KLOR-CON M20) 20 MEQ tablet Taking  No Yes   Sig: Take 1 tablet by mouth 2 times daily.   sildenafil (REVATIO, VIAGRA) 20 MG tablet Taking  No Yes   Sig: Take 4 tablets (80 mg) by mouth 3 times daily.   spironolactone (ALDACTONE) 50 MG tablet Taking  No Yes   Sig: Take one and a half tablets (75 mg) by mouth 2 times daily.   vancomycin (VANCOCIN) 125 MG capsule Taking  No Yes   Sig: Take 1 capsule (125 mg) by mouth 2 times daily for 11 days, then 1 capsule once daily for 7 days, then 1 capsule every 48 hours for 13 days.   vitamin B complex-vitamin C-folic acid (NEPHROCAPS) 1 MG capsule Taking  No Yes   Sig: Take 1 capsule by mouth daily.   vitamin b-12 (CYANOCOBALAMIN) 500 MCG tablet   No No   Sig: TAKE 1 TABLET BY MOUTH DAILY.      Facility-Administered Medications: None       Review of Systems      1.  Constitutional: no night-sweats  2.  Eyes: no eye pain  3.  Ears, nose, Mouth, Throat:  no tinnitis  4.  CV: no claudication  5.  GI: no jaundice  6.  GU: no polyuria  7.  MS: no tendonitis  8.  Skin: no skin flaking  9.  Neuro: no double-vision  10.  Allergic: no itchy eyes  Physical Exam  BP 98/57 (BP Location: Left arm, BP Patient Position: Lying right side)    Pulse (!) 132    Temp 98.3 F (36.8 C)    Resp 22    Ht 5\' 2"  (1.575 m)    Wt 48.2 kg (106 lb 4 oz)    SpO2 100%    BMI 19.43 kg/m     Physical Exam    Constitutional: VS in chart, NAD, conversant  Eyes: conjunctiva not injected, pupils equal  HENT: external ear non-traumatic, o/p clear  At the base of the sternocleidomastoid muscle, soft tissue swelling, 4 cm diameter, overlying scab, no bruit or thrill  Neck: supple, no goiter  Respiratory: no distress, clear  CV: good perfusion  GI: non-distended, no HSM  Lymphatic: no cervical, axillary LAD  Skin: no nodules or skin -flaking  Psych: oriented, good insight  NEURO: moving all 4 extremities, CN2-12 intact fully oriented  POCT Results       Workup Review     CBC generally reassuring, chemistry pending.  Imaging pending    Impression/Medical Decision Making:  Soft tissue swelling, improving since yesterday, no evidence here for arterial injury, but will image and admit to the pulmonary hypertension team.           Kandace Parkins, MD  08/10/17 2224

## 2017-08-10 NOTE — ED Notes (Signed)
Flolan just received from pharm.

## 2017-08-10 NOTE — ED Notes (Addendum)
Handoff report given to [employer].Ready to receive pt. He is aware of pt's Flolan and Dopamine status. Med to be BIB upstairs with pharm sheet.

## 2017-08-10 NOTE — ED Notes (Signed)
S/w Pharm and confirmed pump settings. Will be down to see pt soon.

## 2017-08-10 NOTE — ED EKG Interpretation (Signed)
ED EKG Interpretation    EKG: Sinus Tachycardia with Normal Axis and unchanged from prior tracing.

## 2017-08-11 ENCOUNTER — Inpatient Hospital Stay (HOSPITAL_BASED_OUTPATIENT_CLINIC_OR_DEPARTMENT_OTHER): Payer: PPO

## 2017-08-11 ENCOUNTER — Inpatient Hospital Stay (HOSPITAL_COMMUNITY): Payer: PPO

## 2017-08-11 DIAGNOSIS — I272 Pulmonary hypertension, unspecified: Secondary | ICD-10-CM

## 2017-08-11 DIAGNOSIS — M7981 Nontraumatic hematoma of soft tissue: Secondary | ICD-10-CM

## 2017-08-11 DIAGNOSIS — R221 Localized swelling, mass and lump, neck: Secondary | ICD-10-CM

## 2017-08-11 DIAGNOSIS — Z452 Encounter for adjustment and management of vascular access device: Secondary | ICD-10-CM

## 2017-08-11 LAB — LIVER PANEL, BLOOD
ALT (SGPT): 10 U/L (ref 0–33)
AST (SGOT): 19 U/L (ref 0–32)
Albumin: 3.3 g/dL — ABNORMAL LOW (ref 3.5–5.2)
Alkaline Phos: 102 U/L (ref 35–140)
Bilirubin, Dir: <0.2 mg/dL (ref <0.2)
Bilirubin, Tot: 0.43 mg/dL (ref <1.2)
Total Protein: 6.7 g/dL (ref 6.0–8.0)

## 2017-08-11 LAB — CBC WITH DIFF, BLOOD
ANC-Automated: 3.2 10*3/uL (ref 1.6–7.0)
Abs Basophils: 0 10*3/uL (ref <0.1)
Abs Eosinophils: 0 10*3/uL (ref 0.1–0.5)
Abs Lymphs: 1.1 10*3/uL (ref 0.8–3.1)
Abs Monos: 0.4 10*3/uL (ref 0.2–0.8)
Basophils: 0 %
Eosinophils: 0 %
Hct: 26.8 % — ABNORMAL LOW (ref 34.0–45.0)
Hgb: 8.7 gm/dL — ABNORMAL LOW (ref 11.2–15.7)
Imm Gran %: 4 % — ABNORMAL HIGH (ref <1)
Imm Gran Abs: 0.2 10*3/uL — ABNORMAL HIGH (ref <0.1)
Lymphocytes: 22 %
MCH: 29.4 pg (ref 26.0–32.0)
MCHC: 32.5 g/dL (ref 32.0–36.0)
MCV: 90.5 um3 (ref 79.0–95.0)
Monocytes: 8 %
Plt Count: 26 10*3/uL — ABNORMAL LOW (ref 140–370)
RBC: 2.96 10*6/uL — ABNORMAL LOW (ref 3.90–5.20)
RDW: 19.2 % — ABNORMAL HIGH (ref 12.0–14.0)
Segs: 66 %
WBC: 4.9 10*3/uL (ref 4.0–10.0)

## 2017-08-11 LAB — BASIC METABOLIC PANEL, BLOOD
Anion Gap: 22 mmol/L — ABNORMAL HIGH (ref 7–15)
BUN: 162 mg/dL — ABNORMAL HIGH (ref 8–23)
Bicarbonate: 12 mmol/L — ABNORMAL LOW (ref 22–29)
Calcium: 8.5 mg/dL (ref 8.5–10.6)
Chloride: 97 mmol/L — ABNORMAL LOW (ref 98–107)
Creatinine: 3.49 mg/dL — ABNORMAL HIGH (ref 0.51–0.95)
GFR: 13 mL/min
Glucose: 90 mg/dL (ref 70–99)
Potassium: 3.8 mmol/L (ref 3.5–5.1)
Sodium: 131 mmol/L — ABNORMAL LOW (ref 136–145)

## 2017-08-11 LAB — PROTHROMBIN TIME, BLOOD
INR: 1.5
PT,Patient: 16.2 s — ABNORMAL HIGH (ref 9.7–12.5)

## 2017-08-11 LAB — PRO BNP, BLOOD: BNPP: >70000 pg/mL — ABNORMAL HIGH (ref 0–899)

## 2017-08-11 MED ORDER — SILDENAFIL CITRATE 20 MG OR TABS
80.00 mg | ORAL_TABLET | Freq: Three times a day (TID) | ORAL | Status: DC
Start: 2017-08-11 — End: 2017-08-13
  Administered 2017-08-11 – 2017-08-13 (×6): 80 mg via ORAL
  Filled 2017-08-11: qty 4

## 2017-08-11 MED ORDER — ACETAMINOPHEN 325 MG PO TABS
650.00 mg | ORAL_TABLET | Freq: Four times a day (QID) | ORAL | Status: DC | PRN
Start: 2017-08-11 — End: 2017-08-13
  Administered 2017-08-11 (×2): 650 mg via ORAL
  Filled 2017-08-11: qty 2

## 2017-08-11 MED ORDER — SODIUM BICARBONATE 650 MG OR TABS
650.00 mg | ORAL_TABLET | Freq: Two times a day (BID) | ORAL | Status: DC
Start: 2017-08-11 — End: 2017-08-13
  Administered 2017-08-11 – 2017-08-13 (×6): 650 mg via ORAL
  Filled 2017-08-11 (×5): qty 1

## 2017-08-11 NOTE — Progress Notes (Addendum)
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 .     Today reports her left neck feels a bit better. No f/c. No palpitations. Breathing is stable. No diarrhea. No abdominal pain. LE edema is actually improved.       VITALS  Temperature:  [98.3 F (36.8 C)-98.8 F (37.1 C)] 98.3 F (36.8 C) (07/30 0742)  Blood pressure (BP): (98-118)/(57-82) 110/58 (07/30 0742)  Heart Rate:  [96-132] 105 (07/30 0742)  Respirations:  [18-22] 18 (07/30 0742)  Pain Score: 0 (07/30 0742)  O2 Device: Nasal cannula (07/30 0742)  O2 Flow Rate (L/min):  [3 l/min] 3 l/min (07/30 0742)  SpO2:  [95 %-100 %] 100 % (07/30 0742)     WEIGHTS  Weights (last 14 days)     Date/Time Weight Weight Source Percentage Weight Change (%) Who    08/10/17 2340  47.2 kg (104 lb 0.9 oz)  (Abnormal)   Standing scale  -2.06 % MA    08/10/17 2037  48.2 kg (106 lb 4 oz)  Standing scale  0 % MT            PHYSICAL EXAM  GEN: lying in bed, NAD  HEENT: MMM, NCAT  Neck: 4x2cm protuberant mass on left neck with scab. Not pulsatile. Mildy erythematous but no discharge/pururlence.   CV: Normal S1 and S2. Systolic murmur. Distal radial pulses wnl.  RESP: Scattered crackles  ABD: Soft, non-tender. Distended. Normoactive bowel sounds  NEURO: moving all extremities  EXT: 1+ peripheral edema   Skin: multiple bruises, some skin tears. Right catheter site c/d/i    LABS  Lab Results   Component Value Date    NA 131 (L) 08/11/2017    K 3.8 08/11/2017    CL 97 (L) 08/11/2017    BICARB 12 (L) 08/11/2017    BUN 162 (H) 08/11/2017    CREAT 3.49 (H) 08/11/2017    GLU 90 08/11/2017    [place] 8.5 08/11/2017        Lab Results   Component Value Date    WBC 4.9 08/11/2017    RBC 2.96 (L) 08/11/2017    HGB 8.7 (L) 08/11/2017    HCT 26.8 (L) 08/11/2017    MCV 90.5 08/11/2017    MCHC 32.5 08/11/2017    RDW 19.2 (H) 08/11/2017    PLT 26 (L) 08/11/2017    PLT 179 09/12/2008    MPV 9.3 (L) 07/13/2017     Lab Results   Component Value Date    AST 19 08/11/2017    ALT 10 08/11/2017    LDH 236 (H) 07/18/2016    ALK 102 08/11/2017    TP 6.7 08/11/2017    ALB 3.3 (L) 08/11/2017    TBILI 0.43 08/11/2017    DBILI <0.2 08/11/2017       Lab Results   Component Value Date    BNP 53 10/09/2008    BNPP >70,000 (H) 08/11/2017    BNPP 32,908 (H) 05/15/2017    BNPP 13,583 (H) 08/23/2016     IMAGING  IMPRESSION:  1. Within the limits of a noncontrast  exam, a 4.5 cm mass, presumed hematoma, is present superficial to the sternocleidomastoid muscle, with adjacent dense fragments suggesting fractured retained internal jugular catheter tubing. A longer segment of retained tubing extends within the left internal jugular vein from the level of the clavicle to within 1 cm of the junction with the superior vena cava. Vascular patency cannot be determined on this exam, however the vessel has not collapsed. Discussed with Dr Drema Dallas at 9:25 am on 08/11/17.  2. Right internal jugular venous catheter extends into the superior vena cava.      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 Unfortunately they did a carotid [place]stead of evaluating neck mass/hematoma with dopplers.   - Monitor CBC. Transfuse for Hgb >7. 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 Stable today  # Metabolic acidosis  # Hyperphosphatemia  - Monitor UOP  - Diuresis as above  - continue bicarbonate and phosphate binder started on admission  - Avoiding other nephrotoxic agents    # History of C difficile colitis (Should receive 2 additional doses of PO Vancomycin q48h. However, patient declined and OSH reportedly C difficile negative), monitoring stool output     # Epistaxis, on azelastine  # Malnutrition    FEN: Renal diet with 1.5L fluid restriction  DVT PPx: SCDs. Holding pharmacologic PPx, given thrombocytopenia  Code: DNAR/FULL care: patient is end-stage PAH.    Dispo: pending Ultrasound, likely home     Seen and discussed with Dr. PLanae Crumbly MD  [facility]    Pulmonary Vascular Attending Addendum:  Patient was examined with Dr. BAngelique Holm imaging and tests reviewed.  Please see above note for full details and plan.    67yo F with end stage PAH assoc with CTD, acute on chronic kidney failure, thrmbocytopenia and anemia on IV epo and dopamine who presents with hematoma at site of old L tunneled IJ.  Has large retained catheter fragment there - unable  to be removed by IR during last catheter exchange.    -[place]of hematoma  -IR consulted and aware in the event that hematoma requires intervention  -Xontinue home PAH regimen including IV epo and dopamine  -  Continue PO lasix but hold metolazone tomorrow  -Pt is DNR full care  -Discussed overall poor prognosis with husband again today.  He understands that the patient is deteriorating and has high chance for near - medium term mortality.     [name] MD  Pager [phone]

## 2017-08-11 NOTE — Plan of Care (Addendum)
Problem: Promotion of Health and Safety  Goal: Promotion of Health and Safety  Description  The patient remains safe, receives appropriate treatment and achieves optimal outcomes (physically, psychosocially, and spiritually) within the limitations of the disease process by discharge.    Information below is the current care plan.  Outcome: Progressing  Flowsheets  Taken 08/11/2017 0000  Patient /Family stated Goal: "Figure out whats wrong with my L chest."  Taken 08/11/2017 0137  Guidelines: Inpatient Nursing Guidelines  Individualized Interventions/Recommendations #1: Monitor for any changes in L neck hematoma.  Individualized Interventions/Recommendations #2 (if applicable): Continue flolan and dopamine drip.  Outcome Evaluation (rationale for progressing/not progressing) every shift: Patient sent down to CT for L neck hematoma, awaiting results. Patient converted from home flolan and dopamine to hospital meds/cadd pump. Extra cadd pump at bedside, batteries and tubing changed and verified with Charge and Resource RNs. Dosing sheet at bedside.

## 2017-08-11 NOTE — Interdisciplinary (Signed)
08/11/17 1512   Initial Assessment   CM Initial Assessment Completed   Patient Information   Where was the patient admitted from? Home   Prior to Level of Function Needed Some Assist with Mobility   Assistive Device Not applicable   Prior Community Resources None   Primary Caretaker(s) Spouse/Partner;Family   Primary Contact Name, Number and Relationship Shamikia Linskey Husband/ [phone]   Permission to [place]   ([place] PPO)   [place] Affiliation No   Discharge Planning   Living Arrangements Family Member;Spouse /Significant Other   Available Assistance/Support System Family member(s);Spouse / significant other   Type of Residence Private Residence   Home Care Services No   Additional Services Not Applicable   Barriers to Discharge Awaiting clinical improvement   Patient/Family Engaged in Discharge Planning Yes   Patient Has Decision Making Capacity Yes   Patient/Family/Legal/Surrogate Decision Maker Has Been Given Options And Choice In The Selection of Post-Acute Care Providers Yes   Family/Caregiver's Assessed for Readiness, willingness, and ability to provide or support self-management activities;Readiness to provide care to the patient   Respite Care Not Applicable   Public Health Clearance Needed Not Applicable   Social Worker Consult   Do you need to see a social worker?  No   Readmission Risk Assessment   Readmission Within 30 Days of Discharge No   Admission Was Unplanned   Patient Explanation  Got sicker   Family Explanation Got sicker   Recent Hospitalizations (Within Last 6 Months) Yes   High Risk For Readmission Yes   High Risk Indicators Anticipated long term health care needs (e.g. new diabetic, CHF, Stroke, MI, etc.);Impaired self-care skills;Multiple diagnoses and comorbidities;History of multiple hospital admissions   Action Taken To Prevent Readmission After Discharge Home health referral;PT OT ST  evaluation and recommendation   Recommendations to the Physician Home health orders;Therapy orders;Hospice evaluation   MOON   MOON Provided to Patient Not Applicable     Pt was recently admitted less than 2 months ago, and chart review was completed.      Oxygen: Inogen POC.  Currently on 3L O2 via NC.     Pharmacy:  [facility] / [facility] for Dopamine home infusion / [facility] for Flolan infusion    Transportation:  Husband    DME: none.    Pt was recently admitted and was followed by PCP/ [facility] and [facility] for dopamine infusion.   Pt lives with her husband, and has a son who lives nearby.  Pt on service with [facility] for flolan gtt.  Anticipate pt will return to her home in [place].  [place] referral initiated on pt's behalf.  CM to follow up with referral.

## 2017-08-11 NOTE — Plan of Care (Signed)
Problem: Promotion of Health and Safety  Goal: Promotion of Health and Safety  Description  The patient remains safe, receives appropriate treatment and achieves optimal outcomes (physically, psychosocially, and spiritually) within the limitations of the disease process by discharge.    Information below is the current care plan.  08/11/2017 2354 by Atlee Abide, RN  Outcome: Progressing  Flowsheets  Taken 08/11/2017 2030 by Atlee Abide, RN  Patient /Family stated Goal: To have the ultrasound done so I can go  home  Taken 08/11/2017 1814 by Azucena Freed, RN  Guidelines: Inpatient Nursing Guidelines  Individualized Interventions/Recommendations #2 (if applicable): encourage patient to ask for assistance when transfering to commode for safety.  Taken 08/11/2017 0137 by Milinda Cave, RN  Individualized Interventions/Recommendations #1: Monitor for any changes in L neck hematoma.  Taken 08/11/2017 2354 by Atlee Abide, RN  Individualized Interventions/Recommendations #3 (if applicable): Monitor VS and I&O  Outcome Evaluation (rationale for progressing/not progressing) every shift: Pt VSS - continue to monitor VS and L hematoma. Pt remained safe, uses call light for assistance.  08/11/2017 2354 by Atlee Abide, RN  Outcome: Progressing

## 2017-08-11 NOTE — Interdisciplinary (Signed)
Acknowledged  RN Admission Summary Weight Loss Trigger:    Current Wt:   Weights (last 14 days)     Date/Time Weight Weight Source Percentage Weight Change (%) Who    08/10/17 2340  47.2 kg (104 lb 0.9 oz)  (Abnormal)   Standing scale  -2.06 % MA    08/10/17 2037  48.2 kg (106 lb 4 oz)  Standing scale  0 % MT            Ht:  Ht Readings from Last 1 Encounters:   08/10/17 5\' 2"  (1.575 m)     IBW: 110 lbs   %IBW: 95%  Body mass index is 19.03 kg/m.  UBW: 129 lbs (dry weight)  Wt Readings from Last 20 Encounters:   08/10/17 (!) 47.2 kg (104 lb 0.9 oz)   07/15/17 50.9 kg (112 lb 3.4 oz)   01/21/17 58.9 kg (129 lb 14.4 oz)   10/15/16 67.1 kg (148 lb)   09/10/16 67.1 kg (148 lb)   08/22/16 66 kg (145 lb 8 oz)   08/13/16 63.2 kg (139 lb 5.3 oz)   08/13/16 63 kg (139 lb)   07/28/16 64.2 kg (141 lb 8.6 oz)   06/21/16 64 kg (141 lb 1.5 oz)   05/16/16 74.4 kg (164 lb 0.4 oz)   02/26/16 73.9 kg (162 lb 14.4 oz)   12/12/15 73.5 kg (162 lb)   06/20/15 73.5 kg (162 lb)   02/12/15 73.4 kg (161 lb 12.8 oz)   12/21/14 76.3 kg (168 lb 5 oz)   09/27/14 75 kg (165 lb 6.4 oz)   04/12/14 73 kg (161 lb)   11/02/13 73.4 kg (161 lb 14.4 oz)   03/23/13 71.2 kg (157 lb)       Pt reports an unintentional  weight loss of  43 lbs x 2 months (29% loss) that pt attributes to paracentesis.Patient reports her weight at the prior to weight loss was 148 lbs.    However, data reports weight loss of 44 lbs  x  9 months (30% loss).  Offered pt nutrition supplement, pt declined.   Relayed findings to RD via EMR. Will continue to f/u per policy.   Vernice Jefferson, MS,DTR

## 2017-08-11 NOTE — Plan of Care (Signed)
Problem: Promotion of Health and Safety  Goal: Promotion of Health and Safety  Description  The patient remains safe, receives appropriate treatment and achieves optimal outcomes (physically, psychosocially, and spiritually) within the limitations of the disease process by discharge.    Information below is the current care plan.  Flowsheets  Taken 08/11/2017 1552 by Azucena Freed, RN  Patient /Family stated Goal: find out results of tests  Taken 08/11/2017 1814 by Azucena Freed, RN  Guidelines: Inpatient Nursing Guidelines  Individualized Interventions/Recommendations #2 (if applicable): encourage patient to ask for assistance when transferring to commode for safety.  Outcome Evaluation (rationale for progressing/not progressing) every shift: Patient's husband stated Left neck hematoma is improving. Ultrasound ordered. Contacted to complete test. Bed alarm used for patient. Patient's husband watching RNs when turning off alarm. Asked multiple times if staff is needed to be present when patient is transferring to commode. Asked patient and husband to please call each time for assistance due to patient's high fall risk. Patient stated understanding.  Taken 08/11/2017 0137 by Milinda Cave, RN  Individualized Interventions/Recommendations #1: Monitor for any changes in L neck hematoma.

## 2017-08-12 ENCOUNTER — Ambulatory Visit (HOSPITAL_COMMUNITY): Payer: PPO | Admitting: Pulmonary Medicine

## 2017-08-12 ENCOUNTER — Encounter (HOSPITAL_COMMUNITY): Payer: Self-pay

## 2017-08-12 ENCOUNTER — Inpatient Hospital Stay (HOSPITAL_COMMUNITY): Payer: PPO

## 2017-08-12 DIAGNOSIS — N179 Acute kidney failure, unspecified: Secondary | ICD-10-CM

## 2017-08-12 DIAGNOSIS — S1093XA Contusion of unspecified part of neck, initial encounter: Secondary | ICD-10-CM

## 2017-08-12 DIAGNOSIS — T82514D Breakdown (mechanical) of infusion catheter, subsequent encounter: Secondary | ICD-10-CM

## 2017-08-12 DIAGNOSIS — M7981 Nontraumatic hematoma of soft tissue: Secondary | ICD-10-CM

## 2017-08-12 DIAGNOSIS — N189 Chronic kidney disease, unspecified: Secondary | ICD-10-CM

## 2017-08-12 LAB — ECG 12-LEAD
ATRIAL RATE: 125 {beats}/min
P AXIS: 29 degrees
PR INTERVAL: 174 ms
QRS INTERVAL/DURATION: 94 ms
QT: 324 ms
QTC INTERVAL: 467 ms
R AXIS: 111 degrees
T AXIS: -18 degrees
VENTRICULAR RATE: 125 {beats}/min

## 2017-08-12 LAB — MAGNESIUM, BLOOD: Magnesium: 2.4 mg/dL (ref 1.6–2.4)

## 2017-08-12 LAB — BASIC METABOLIC PANEL, BLOOD
Anion Gap: 24 mmol/L — ABNORMAL HIGH (ref 7–15)
BUN: 172 mg/dL — ABNORMAL HIGH (ref 8–23)
Bicarbonate: 12 mmol/L — ABNORMAL LOW (ref 22–29)
Calcium: 8.1 mg/dL — ABNORMAL LOW (ref 8.5–10.6)
Chloride: 97 mmol/L — ABNORMAL LOW (ref 98–107)
Creatinine: 3.65 mg/dL — ABNORMAL HIGH (ref 0.51–0.95)
GFR: 12 mL/min
Glucose: 88 mg/dL (ref 70–99)
Potassium: 3.4 mmol/L — ABNORMAL LOW (ref 3.5–5.1)
Sodium: 133 mmol/L — ABNORMAL LOW (ref 136–145)

## 2017-08-12 LAB — CBC WITH DIFF, BLOOD
ANC-Automated: 3.5 10*3/uL (ref 1.6–7.0)
Abs Basophils: 0 10*3/uL (ref <0.1)
Abs Eosinophils: 0 10*3/uL (ref 0.1–0.5)
Abs Lymphs: 1 10*3/uL (ref 0.8–3.1)
Abs Monos: 0.4 10*3/uL (ref 0.2–0.8)
Basophils: 0 %
Eosinophils: 0 %
Hct: 27.2 % — ABNORMAL LOW (ref 34.0–45.0)
Hgb: 8.7 gm/dL — ABNORMAL LOW (ref 11.2–15.7)
Imm Gran %: 3 % — ABNORMAL HIGH (ref <1)
Imm Gran Abs: 0.1 10*3/uL (ref <0.1)
Lymphocytes: 20 %
MCH: 28.8 pg (ref 26.0–32.0)
MCHC: 32 g/dL (ref 32.0–36.0)
MCV: 90.1 um3 (ref 79.0–95.0)
Monocytes: 7 %
Plt Count: 29 10*3/uL — ABNORMAL LOW (ref 140–370)
RBC: 3.02 10*6/uL — ABNORMAL LOW (ref 3.90–5.20)
RDW: 18.8 % — ABNORMAL HIGH (ref 12.0–14.0)
Segs: 70 %
WBC: 5.1 10*3/uL (ref 4.0–10.0)

## 2017-08-12 LAB — C.DIFFICILE TOXIN PCR, STOOL: C.Difficile Toxin, PCR: NOT DETECTED

## 2017-08-12 LAB — MRSA SURVEILLANCE CULTURE

## 2017-08-12 LAB — PHOSPHORUS, BLOOD: Phosphorous: 9.9 mg/dL (ref 2.7–4.5)

## 2017-08-12 NOTE — Discharge Summary (Signed)
Patient Name:  Diana Chambers    Principal Diagnosis (required):  Hematoma of neck, initial encounter    Hospital Problem List (required):  Active Hospital Problems    Diagnosis    *Hematoma of neck, initial encounter [S10.93XA]    Acute-on-chronic kidney injury (CMS-HCC) [N17.9, N18.9]    Pulmonary HTN (CMS-HCC) [I27.20]    Thrombocytopenia (CMS-HCC) [D69.6]    Hypoxia [R09.02]      Resolved Hospital Problems   No resolved problems to display.     Additional Hospital Diagnoses ("rule out" or "suspected" diagnoses, etc.):  Malnutrition Diagnosis (if any): Severe malnutrition    Principal Procedure During This Hospitalization (required):  CT imaging of neck  Ultrasound of neck    Other Procedures Performed During This Hospitalization (required):  Procedure results are available in Chart Review in Epic.  For those providers external to [place], the key procedure results are listed below:    CT neck 08/12/17  IMPRESSION:  1. Within the limits of a noncontrast exam, a 4.5 cm mass, presumed hematoma, is present superficial to the sternocleidomastoid muscle, with adjacent dense fragments suggesting fractured retained internal jugular catheter tubing. A longer segment of retained tubing extends within the left internal jugular vein from the level of the clavicle to within 1 cm of the junction with the superior vena cava. Vascular patency cannot be determined on this exam, however the vessel has not collapsed. Discussed with Dr Drema Dallas at 9:25 am on 08/11/17.  2. Right internal jugular venous catheter extends into the superior vena cava.    Left neck US 08/12/17  IMPRESSION:  A 2.6 x 3.9 x 2.7 cm heterogeneous mass anterior to the area of the left sternocleidomastoid muscle is consistent with hematoma, as provided in the clinical history.  Also see CT neck from yesterday for additional findings.    Consultations Obtained During This Hospitalization:  Interventional Radiology: telephone consult. Reviewed imaging.  Determined that this was a hematoma and no intervention was recommended.     Reason for Admission to the Hospital / History of Present Illness:  "Kristi Norment is a 67 year old female with PMH SLE complicated by [facility] PAH on triple therapy in setting of anorexogen use, chronic hypoxemic respiratory failure on 3L, recent hospitalization for decompensated R heart failure who presented on 07/29 for L hematoma.     On 07/07, she notes that her line "fell out." On 07/08, this line was replaced on R at OSH. During this placement the remainder of the catheter in L chest was removed (Per 07/13 IR Note, the remainder of this catheter had been stuck in the vein. She was informed that it could not be removed without major surgical intervention. Nobody should attempt to remove the remainder of this catheter)    Patient reports hospitalization on 07/21 - 07/26 for epistaxis requiring rhino rocket.     Yesterday, she presented due to L chest swelling and associated pain at site of prior catheter. She was concerned for hematoma. Family notes that it has decreased in size today. Denies F, chills, night sweats.,     Denies chest pain, shortness of breath, cough, N/V. On ROS, she reports significant weight loss due to poor PO intake. She attributes this poor PO intake due to PO Vancomycin for C difficile. She does not want to continue taking PO Vancomycin. She notes that OSH hospital performed C difficile test on her diarrhea, which was negative. Last diarrhea was yesterday without episode the day prior. "  Hospital Course by Problem (required):  # Left neck hematoma:   # Thrombocytopenia  Ultrasound showed hematoma with retained fragments of 15y old Biovac catheter. Reviewed with Interventional Radiology who did not recommend any intervention. Her platelets were stable at 29. She did not receive any platelets since her hematoma was not expanding and since this risks worsening pulmonary  vasoconstriction.    #[place] complicated by WHO Group I Pulmonary Arterial Hypertension  # Chronic hypoxemic respiratory failure on 3L: stable  #Chronic Right Ventricular failure:   Continued on dopamine@2  mcg/kg/min, sildenafil, IV epoprostenol @35  ng/kg/min. She was continued on her home lasix and spironolactone but her metolazone was held due to acute kidney inury    # Chronic Hyponatremia, likely hypervolemic: stable 130-133    # Acute on chronic kidney injury (baselineCr1.3), likely cardiorenal. 3.46-->3.65 on discharge. Pt is not dialysis candidate. Goals of care discussions ongoing.     # History of c difficile colitis: at outside hospital. Patient should have received two additional doses of PO Vancomycin q48h. However, patient declined and outside hospital reportedly C difficile negative). No diarrhea this admission. C.diff toxin also negative here.     # Recent Epistaxis: No bleeding this admission. Continued on azelastine    Tests Outstanding at Discharge Requiring Follow Up: none    Discharge Condition (required):  Improved.    Key Physical Exam Findings at Discharge:  Mental Status Exam: Patient is alert and oriented to person, place, time, and situation.  Physical examination is significant for:      Neck: 4x2cm protuberant mass on left neck with scab. Not pulsatile. Mildy erythematous but no discharge/pururlence. unchanged  CV: Normal S1 and S2. Systolic murmur. Distal radial pulses wnl.  RESP: Scattered crackles  ABD: Soft, non-tender. Distended. Normoactive bowel sounds  NEURO: moving all extremities  EXT: 1+ peripheral edema   Skin: multiple bruises, some skin tears. Right catheter site c/d/i      Core Measures for Heart Failure:  The patient was not required to have her left ventricular ejection fraction measured, and is not required to have a prescription for an ACE inhibitor, angiotensin receptor blocker, or beta blocker, as her heart failure syndrome is that of right-sided heart failure  due to pulmonary hypertension.    Discharge Diet:  Low-salt.    Discharge Medications:     What To Do With Your Medications      CONTINUE taking these medications      Add'l Info   azelastine 0.1 % nasal spray  Commonly known as:  ASTELIN  Spray 1 spray into each nostril 2 times daily. Use in each nostril as directed   Quantity:  1 bottle  Refills:  3     DOPamine 400 mg/250 mL infusion  Commonly known as:  INTROPIN  Inject 123 mcg/min into vein continuous.   Quantity:  100 mL  Refills:  1     epoetin alfa-epbx 4000 UNIT/ML  Commonly known as:  RETACRIT  Inject 1 mL (4,000 Units) under the skin once a week.   Quantity:  4 mL  Refills:  3     estradiol 2 MG vaginal ring  Commonly known as:  ESTRING  Insert 1 Ring vaginally Every 3 months. Follow package directions   Refills:  0     ferrous sulfate 324 (65 Fe) MG Tbec  Take 1 tablet (324 mg) by mouth daily.   Quantity:  30 tablet  Refills:  0     FLOLAN injection  29ng/kg/min  Generic drug:  epoprostenol   Refills:  0     furosemide 40 MG tablet  Commonly known as:  LASIX  Take 2.5 tablets (100 mg) by mouth 3 times daily.   Quantity:  200 tablet  Refills:  3     medroxyPROGESTERone 2.5 MG tablet  Commonly known as:  PROVERA  Take 1.25 mg by mouth daily.   Refills:  0     metolazone 5 MG tablet  Commonly known as:  ZAROXOLYN  Take 1 tablet (5 mg) by mouth daily.   Quantity:  30 tablet  Refills:  1     potassium chloride 20 MEQ tablet  Commonly known as:  KLOR-CON M20  Take 1 tablet (20 mEq) by mouth 2 times daily.   Quantity:  30 tablet  Refills:  3     RENO CAPS 1 MG Caps  Take 1 capsule by mouth daily.   Quantity:  30 capsule  Refills:  0     sildenafil 20 MG tablet  Commonly known as:  REVATIO, VIAGRA  Take 4 tablets (80 mg) by mouth 3 times daily.   Quantity:  360 tablet  Refills:  2     spironolactone 50 MG tablet  Commonly known as:  ALDACTONE  Take one and a half tablets (75 mg) by mouth 2 times daily.   Quantity:  90 tablet  Refills:  1     vancomycin 125 MG  capsule  Commonly known as:  VANCOCIN  Take 1 capsule (125 mg) by mouth 2 times daily for 11 days, then 1 capsule once daily for 7 days, then 1 capsule every 48 hours for 13 days.   Quantity:  36 capsule  Refills:  0     vitamin b-12 500 MCG tablet  Commonly known as:  CYANOCOBALAMIN  TAKE 1 TABLET BY MOUTH DAILY.   Quantity:  30 each  Refills:  3     vitamin D3 2000 units capsule  Take 1 capsule by mouth daily.   Refills:  0           Where to Get Your Medications      These medications were sent to [facility] #[id number] [facility], [place]  [place], [place] [address]    Phone:  [phone]    potassium chloride 20 MEQ tablet         Allergies:  Allergies   Allergen Reactions    Allopurinol Hives    Bumex [Bumetanide] Other     Muscle aches and stiffness      Levaquin Swelling and Other     Severe joint pain      Ofloxacin Nausea and Vomiting    Chlorhexidine Itching    Sulfa Drugs Unspecified        Discharge Disposition:  Home.    Discharge Code Status:  Do Not Attempt Resuscitation / full care  This code status is not changed from the time of admission.    Follow Up Appointments:    Scheduled appointments:  No future appointments.    For appointments requested for after discharge that have not yet been scheduled, refer to the Post Discharge Referrals section of the After Visit Summary.    Discharging 53 Contact Information:  [facility] operator at [phone].

## 2017-08-12 NOTE — Plan of Care (Signed)
Problem: Promotion of Health and Safety  Goal: Promotion of Health and Safety  Description  The patient remains safe, receives appropriate treatment and achieves optimal outcomes (physically, psychosocially, and spiritually) within the limitations of the disease process by discharge.    Information below is the current care plan.  Outcome: Progressing  Flowsheets  Taken 08/12/2017 0748 by Larence Penning, RN  Patient /Family stated Goal: To have the U/S done  Taken 08/11/2017 1814 by Azucena Freed, RN  Guidelines: Inpatient Nursing Guidelines  Individualized Interventions/Recommendations #2 (if applicable): encourage patient to ask for assistance when transfering to commode for safety.  Taken 08/11/2017 0137 by Milinda Cave, RN  Individualized Interventions/Recommendations #1: Monitor for any changes in L neck hematoma.  Taken 08/11/2017 2354 by Atlee Abide, RN  Individualized Interventions/Recommendations #3 (if applicable): Monitor VS and I&O  Taken 08/12/2017 1129 by Larence Penning, RN  Individualized Interventions/Recommendations #4 (if applicable): Make sure U/S of subclavian hematoma is completed today.  Individualized Interventions/Recommendations #5 (if applicable): Monitoring lab values and reporting timely to MD.  Outcome Evaluation (rationale for progressing/not progressing) every shift: Pt BM collected to r/o C-Diff.  Given all meds except for PO potassium binders.  Pt declined them reporting that she cannot swallow it.  MD notified and was ok with pt declining.  Notified K 3.4 and phosporous 9.9.  U/S of subclavian hematoma done.  Pending results.

## 2017-08-12 NOTE — Progress Notes (Addendum)
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 .     Today reports her left neck feels the same. Breathing is stable. LE edema is stable. No new complaints. Had normal stool.     VITALS  Temperature:  [97.4 F (36.3 C)-99.3 F (37.4 C)] 98.5 F (36.9 C) (07/31 1127)  Blood pressure (BP): (109-124)/(53-65) 109/60 (07/31 1337)  Heart Rate:  [99-113] 112 (07/31 1337)  Respirations:  [15-20] 20 (07/31 1127)  Pain Score: 0 (07/31 1127)  O2 Device: Nasal cannula (07/31 1127)  O2 Flow Rate (L/min):  [3 l/min] 3 l/min (07/31 1127)  SpO2:  [99 %-100 %] 100 % (07/31 1127)     WEIGHTS  Weights (last 14 days)     Date/Time Weight Weight Source Percentage Weight Change (%) Who    08/12/17 0320  -- pt refused, wants weight at a later time  --  -- AE    Weight: pt refused, wants weight at a later time by Diana Chambers at 08/12/17 0320    08/10/17 2340  47.2 kg (104 lb 0.9 oz)  (Abnormal)   Standing scale  -2.06 % MA    08/10/17 2037  48.2 kg (106 lb 4 oz)  Standing scale  0 % MT            PHYSICAL EXAM  GEN: sitting up eating breakfast  HEENT: MMM, NCAT  Neck: 4x2cm protuberant mass on left neck with scab. Not pulsatile. Mildy erythematous but no discharge/pururlence. unchanged  CV: Normal S1 and S2. Systolic murmur. Distal radial pulses wnl.  RESP: Scattered crackles  ABD: Soft, non-tender. Distended. Normoactive bowel sounds  NEURO: moving all extremities  EXT: 1+ peripheral edema   Skin: multiple bruises, some skin tears. Right catheter site c/d/i    LABS  Lab Results   Component Value Date    NA 133 (L) 08/12/2017    K 3.4 (L) 08/12/2017     CL 97 (L) 08/12/2017    BICARB 12 (L) 08/12/2017    BUN 172 (H) 08/12/2017    CREAT 3.65 (H) 08/12/2017    GLU 88 08/12/2017    [place] 8.1 (L) 08/12/2017     Lab Results   Component Value Date    WBC 5.1 08/12/2017    RBC 3.02 (L) 08/12/2017    HGB 8.7 (L) 08/12/2017    HCT 27.2 (L) 08/12/2017    MCV 90.1 08/12/2017    MCHC 32.0 08/12/2017    RDW 18.8 (H) 08/12/2017    PLT 29 (L) 08/12/2017    PLT 179 09/12/2008    MPV 9.3 (L) 07/13/2017     Lab Results   Component Value Date    AST 19 08/11/2017    ALT 10 08/11/2017    LDH 236 (H) 07/18/2016    ALK 102 08/11/2017    TP 6.7 08/11/2017    ALB 3.3 (L) 08/11/2017    TBILI 0.43 08/11/2017    DBILI <0.2 08/11/2017       Lab Results   Component Value Date    BNP 53 10/09/2008    BNPP >70,000 (  H) 08/11/2017    BNPP 32,908 (H) 05/15/2017    BNPP 13,583 (H) 08/23/2016     IMAGING  IMPRESSION:  1. Within the limits of a noncontrast exam, a 4.5 cm mass, presumed hematoma, is present superficial to the sternocleidomastoid muscle, with adjacent dense fragments suggesting fractured retained internal jugular catheter tubing. A longer segment of retained tubing extends within the left internal jugular vein from the level of the clavicle to within 1 cm of the junction with the superior vena cava. Vascular patency cannot be determined on this exam, however the vessel has not collapsed. Discussed with Dr Diana Chambers at 9:25 am on 08/11/17.  2. Right internal jugular venous catheter extends into the superior vena cava.    Left neck US 08/12/17  IMPRESSION:  A 2.6 x 3.9 x 2.7 cm heterogeneous mass anterior to the area of the left sternocleidomastoid muscle is consistent with hematoma, as provided in the clinical history.  Also see CT neck from yesterday for additional findings.    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 Reviewed with Interventional Radiology who did not recommend any intervention.   - Monitor CBC. Transfuse for Hgb >7. 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 increased today. Pt is not iHD candidate. GEllstondiscussions ongoing  # Metabolic acidosis  # Hyperphosphatemia  - Monitor UOP  - Diuresis as above  - continue bicarbonate and phosphate binder started on admission  - Avoiding other nephrotoxic agents    # History of C difficile colitis (Should receive 2 additional doses of PO Vancomycin q48h. However, patient declined and OSH reportedly C difficile negative), monitoring stool output   - f/u c.diff toxin    # Recent Epistaxis, on azelastine  # Malnutrition    FEN: Renal diet with 1.5L fluid restriction  DVT PPx: SCDs. Holding pharmacologic PPx, given thrombocytopenia  Code: DNAR/FULL care: patient is end-stage PAH.    [facility] will [facility]     Seen and discussed with Dr. PBettey Chambers   Diana Manly MD  Fellow, [facility]    Pulmonary  Vascular Attending Addendum:  Patient was examined with Dr. BAngelique Holm imaging and tests reviewed.  Please see above note for full details and plan.    67yo F with end stage PAH assoc with CTD,  acute on chronic kidney failure, thrmbocytopenia and anemia on IV epo and dopamine who presents with hematoma at site of old L tunneled IJ.  Has large retained catheter fragment there - unable to be removed by IR during last catheter exchange.    -Continue home PAH regimen including IV epo and dopamine  -Continue PO lasix but hold metolazone  -Pt is DNR full care  -Discussed overall poor prognosis with husband again yesterday.  He understands that the patient is deteriorating and has high chance for near - medium term mortality.     [name] MD  Pager [phone]

## 2017-08-12 NOTE — Interdisciplinary (Signed)
See spiritual care intervention flow sheet for detailed info about the visit.

## 2017-08-12 NOTE — Interdisciplinary (Addendum)
Nutrition Note    Evaluation Type: Initial    Recommendations:    - Continue renal 60gm pro diet. 1545m volume restriction per MD.   - REC d/c cholecalciferol at this time given prior Vit D lab value 07/02/17 WDL and renal fx currently worsening at this time.   - REC daily nephrocaps given severe malnutrition/poor PO intake.  - Continue phos binders as tolerated. Pt reported phosphorus binders cause her abdominal discomfort when ingested.   - Pending GOC; Consider checking quantitative zinc lab to determine if low lab value contributing to altered taste changes.  - Further aggressive nutrition interventions likely not warranted given overall medical status. GGrahamdiscussions ongoing.   - Trend weights daily                                                                                                                                       A: Per MD: 67year old female with PMH SLE [facility] by [facility] on triple therapy in setting of anorexogen use, chronic hypoxemic respiratory failure on 3L, recent hospitalization for decompensated R heart failure who presented on 07/29 for L neck likely hematoma after left IJ removed with 89yrld catheter in that vessel.     Nutrition Summary   Current Nutrition Regimen: Renal 60gm pro diet + 150023molume restriction  Source of Information: Chart Review;Spoke with patient  Barriers to Intake: Fair appetite;Abdominal discomfort;Taste alteration;Fatigue with feeding;Decreased appetite;Early satiety  Additional Comments: Pt familar to this RD from prior admits. Reported chronic poor appetite for the past few months. Pt reported taste changes and early satiety are majority contributor to poor appetite. Reported food often tasting bland or metallic. Discussed non-pharmalogical interventions with patient at time of visit to promote desire for PO; such as watching food network TV shows and having small frequent meals throughout the day. Pt reported taking phosphorus binders in  the past but often did not settle well with her stomach. Reported trying multiple phos binders in the past all with the same symptoms of abdominal discomfort after.   Adequacy of Nutrition Intake: Meeting <25% of estimated needs     Pt reported poor appetite overall. Only consumed approx. 50% of omelet this AM and some cereal. Asking for mighty shakes which she received at OSH. Unfortunately mighty shakes not available on [facility] formulary. Pt dislikes Nepro (has tried cafe mocha & vanilla) in the past and have not "settled right" with pt. Often causing abdominal discomfort.   Tray Items Taken for the past 168 hrs:   Number of Items Taken Number of Items on Tray Diet Tolerance   08/11/17 0800 3 3 Tolerates   08/11/17 1200 2 3 --   08/12/17 0900 -- -- Tolerates   08/12/17 1400 2 3 Tolerates     Anthropometrics   Height - Most Recent Measurement   08/10/17 _0  (1.575 m)     Weight  For Nutrition Equations: 47.2 kg (104 lb 0.9 oz)  Weight for Equations Reflects?: Current lowest body weight  BMI for Nutrition Calculations: 19.03   Ideal Body Weight (kg): 49.9  Percent of Ideal Body Weight: 94.59 %  Usual Body Weight (Dietary): 58.5 kg (129 lb - in January 2019 per pt)   Change from UBW (%): -19.34 %  Time Frame of Weight Change From UBW: 6-12 months    Weights (last 14 days)     Date/Time Weight Weight Source Percentage Weight Change (%) Who    08/12/17 0320  -- pt refused, wants weight at a later time  --  -- AE    Weight: pt refused, wants weight at a later time by Janet Berlin at 08/12/17 0320    08/10/17 2340  47.2 kg (104 lb 0.9 oz)  (Abnormal)   Standing scale  -2.06 % MA    08/10/17 2037  48.2 kg (106 lb 4 oz)  Standing scale  0 % MT             Wt Readings from Last 20 Encounters:   08/10/17 (!) 47.2 kg (104 lb 0.9 oz)   07/15/17 50.9 kg (112 lb 3.4 oz)   01/21/17 58.9 kg (129 lb 14.4 oz)   10/15/16 67.1 kg (148 lb)   09/10/16 67.1 kg (148 lb)   08/22/16 66 kg (145 lb 8 oz)   08/13/16 63.2 kg (139 lb 5.3 oz)    08/13/16 63 kg (139 lb)   07/28/16 64.2 kg (141 lb 8.6 oz)   06/21/16 64 kg (141 lb 1.5 oz)   05/16/16 74.4 kg (164 lb 0.4 oz)   02/26/16 73.9 kg (162 lb 14.4 oz)   12/12/15 73.5 kg (162 lb)   06/20/15 73.5 kg (162 lb)   02/12/15 73.4 kg (161 lb 12.8 oz)   12/21/14 76.3 kg (168 lb 5 oz)   09/27/14 75 kg (165 lb 6.4 oz)   04/12/14 73 kg (161 lb)   11/02/13 73.4 kg (161 lb 14.4 oz)   03/23/13 71.2 kg (157 lb)     Pt reported UBW ~129lbs in January 2019. Has since had poor appetite d/t early satiety/abd discomfort/intermittent paracentesis and multiple hospitalizations leading to further weight loss down to current lowest wt of 104lbs. Total unintentional wt loss of 25lbs (20% body wt) loss x ~7 months. Wt loss significant.     Estimated Needs  Calories: 1415 kcal/day - 1651 kcal/day (30 kcal/kg/day - 35 kcal/kg/day x 47.2 kg (104 lb))  Protein: 28 g/day - 38 g/day (0.6 g/kg/day - 0.8 g/kg/day(monitor renal fx) x 47.2 kg (104 lb))  Fluids: Deferred to MD d/t renal fx    Mifflin-St. Jeor Equation: 965    Last Nutrition Focused Physical Exam   Body Fat Loss    Orbital (!) Severe(Pt with cachetic appearance. Severe losses in all extremities. ) (08/12/17 1455)   Upper Arm (!) Severe (08/12/17 1455)   Thoracic/Lumbar (!) Severe (08/12/17 1455)   Muscle Mass Loss    Temple (!) Severe (08/12/17 1455)   Clavicle Bone Region (!) Severe (08/12/17 1455)   Deltoid (!) Severe (08/12/17 1455)   Scapula Bone Region (!) Severe (08/12/17 1455)   Interosseous (!) Severe (08/12/17 1455)   Anterior Thigh (!) Severe (08/12/17 1455)   Patellar Regoin (!) Severe (08/12/17 1455)   Posterior Calf (!) Severe (08/12/17 1455)   Micronutrient Deficiency Hair (thin), Multiple dark spots on pts upper arms (reported d/t low platelets per pt). (08/12/17  1455)     Malnutrition Diagnostic Criteria - Chronic Illness  Malnutrition Designation: Severe  Energy Intake: <75% for greater than or equal to 1 Month  Weight Loss: >10% in 6 Months  Body Fat Loss:  Severe  Muscle Mass Loss: Severe  Chronic Dx Status: Ongoing    Clinical Considerations:   Allergies: Allopurinol; Bumex [bumetanide]; Levaquin; Ofloxacin; Chlorhexidine; and Sulfa drugs  IV Access:  Peripheral IV - 20 G Right Antecubital (Active)     CVC Double Lumen - Present on admission Right Subclavian (Active)       GI: LBM 08/12/17 x4 (partially liquid) per I/Os. Flat abdomen w/ +BS/flatus and diarrhea per RN head to toe assessment.     Skin Integrity:  Skin Integrity (WDL): Exceptions to WDL     Skin Integrity  Skin Color: Flushed  Skin Temp: Warm  Generalized Skin Integrity: Ecchymosis    Wounds/Incisions:  Impaired Skin Integrity -  Skin tear Elbow Right;Left (Active)     Pressure Injuries:  Pressure Ulcer/ Injury -  Stage II (skin loss at dermis level over pressure points) Mid Sacrum (Active)     Edema:    Edema  RL Extremity: Non-pitting  LL Extremity: Non-pitting    Labs: reviewed   Recent Labs     08/10/17  2133 08/11/17  0618 08/11/17  0619 08/12/17  0617   NA 130* 131*  --  133*   K 4.0 3.8  --  3.4*   CL 94* 97*  --  97*   BICARB 12* 12*  --  12*   BUN 157* 162*  --  172*   CREAT 3.46* 3.49*  --  3.65*   GLU 110* 90  --  88   [place] 8.6 8.5  --  8.1*   MG 2.3  --   --  2.4   PHOS 9.1*  --   --  9.9*   ALK 112 102  --   --    ALT 12 10  --   --    AST 21 19  --   --    TBILI 0.47 0.43  --   --    DBILI <0.2 <0.2  --   --    ALB 3.6 3.3*  --   --    WBC 5.6  --  4.9 5.1   ABSNEUTRO 4.8  --  3.2 3.5     No results found for: CHOL, HDL, LDLCALC, TRIG, LDLDIRECT    Lab Results   Component Value Date    A1C 5.5 07/20/2016     No results for input(s): GLUCPOCT in the last 72 hours.    Lab Results   Component Value Date    VD2 <5 07/02/2017    VD3 38 07/02/2017    VDT 38 07/02/2017     Medication Review Comments:    IV:    DOPamine 2 mcg/kg/min (08/12/17 0730)    epoprostenol (FLOLAN) infusion 35 ng/kg/min (08/12/17 1405)    sodium chloride       Scheduled:    azelastine  1 spray BID    cholecalciferol   2,000 Units Daily    furosemide  100 mg TID    Prostacyclin Cassette Change   Daily    Prostacyclin Tubing   Once per day on Mon Wed Fri    Prostacylin Batteries   Once per day on Mon    sevelamer carbonate  800 mg TID WC    sildenafil  80 mg  TID    sodium bicarbonate  650 mg BID    sodium chloride (PF)  3 mL Q8H    spironolactone  75 mg BID     Discharge: Renal 60gm pro diet    Education: N/A at this time    RD/DTR to monitor/evaluate: labs, wt trend, and po/nutrition support status, and S/S of new skin concerns.  Relayed recommendations to MD.     Will continue to follow patient per approved [place] Nutrition Prioritization Schedule guidelines. Nutrition Services remains available via [place] should patient medical status change.    Awanda Mink, RD, [place] Pager [phone] 08/12/2017

## 2017-08-12 NOTE — Discharge Instructions (Signed)
Diagnosis and Reason for Admission    You were admitted to the hospital for the following reason(s):  Left neck hematoma    Your full diagnosis list is located on this After Visit Summary in the Hospital Problems section.    What [place] Hospital Stay    The main tests and treatments done for you during this hospitalization were:    -Ultrasound of left neck hematoma  -CT of left neck hematoma    STOP taking your potassium supplement  Get your labs on Friday  See Dr. Hilario Quarry in clinic in 2-3 weeks      Instructions for After Discharge  Your diet at home should be a low-salt diet.    Your medication list is located on this After Visit Summary in the Current Discharge Medication List section.  Your nurse will review this information with you before you leave the hospital.    What Needs to Happen Next After Discharge -- Appointments and Follow Up    Any appointments already scheduled at [facility] clinics will be listed in the Future Appointments section at the top of this After Visit Summary.      Medical Home Information    Your primary care provider or clinic currently on file at [facility] is: Renne Musca, June  You currently have an advance directive or living will on file at [facility]: Yes

## 2017-08-13 MED ORDER — POTASSIUM CHLORIDE CRYS CR 20 MEQ OR TBCR
20.00 meq | EXTENDED_RELEASE_TABLET | Freq: Two times a day (BID) | ORAL | 3 refills | Status: DC
Start: 2017-08-13 — End: 2017-08-27

## 2017-08-13 NOTE — Interdisciplinary (Signed)
Spoke with Jackelyn Poling, at [facility] [phone] .  Faxed over clinical info for [facility] RN referral, to [phone].  Pending response for acceptance.

## 2017-08-13 NOTE — Interdisciplinary (Signed)
Left a message with [facility], whom pt says she is currently on service with.  Referral sent thru allscripts.  [facility] called and left a message at [phone].  Awaiting return call.

## 2017-08-13 NOTE — Interdisciplinary (Signed)
Confirmed with Lonn Georgia, at [facility], that [facility] RN services will resume upon discharge.  Also confirmed with Lilliana at [facility], that dopamine infusion services will resume upon discharge as well.

## 2017-08-13 NOTE — Interdisciplinary (Addendum)
Spoke with Lonn Georgia, at [facility] [phone].  Referral information faxed to [phone].  Awaiting response for resumption of care.

## 2017-08-13 NOTE — Interdisciplinary (Signed)
[facility] called to request resumption prescription from MD.  Pharmacist at [facility] to contact first to call MD Cheryln Manly on pgr [phone].

## 2017-08-13 NOTE — Plan of Care (Signed)
Problem: Promotion of Health and Safety  Goal: Promotion of Health and Safety  Description  The patient remains safe, receives appropriate treatment and achieves optimal outcomes (physically, psychosocially, and spiritually) within the limitations of the disease process by discharge.    Information below is the current care plan.  Flowsheets  Taken 08/13/2017 1056 by Bosie Helper, RN  Patient /Family stated Goal: Go home today  Individualized Interventions/Recommendations #1: encouraged pt and pt's husband to verbalized questions and concerns regarding pt's dc instructions and home care  Individualized Interventions/Recommendations #2 (if applicable): provide pt with flolan cassette to go home with and changed the existing cassette to a new one  Outcome Evaluation (rationale for progressing/not progressing) every shift: pt's VS remain stable,pt tolerating flolan cont iv infusion and dopamine infusion dose running via her central line. CVL dressing cg done, no s/s of infection noted at site.pt given extra flolan cassette to go home with and cassette cg done to the current infusion. Pt's questions and concerns regarding pt's home care was answered and printed dc instructions provided. Home health was set up to follow pt at home. Dr [name] came to see, and spoke with pt and pt's husband at bedside prior to dc.  Taken 08/13/2017 0313 by Burna Cash, Willia Craze, RN  Guidelines: Inpatient Nursing Guidelines

## 2017-08-13 NOTE — Plan of Care (Signed)
Problem: Promotion of Health and Safety  Goal: Promotion of Health and Safety  Description  The patient remains safe, receives appropriate treatment and achieves optimal outcomes (physically, psychosocially, and spiritually) within the limitations of the disease process by discharge.    Information below is the current care plan.  Outcome: Progressing  Flowsheets  Taken 08/12/2017 2135 by Burna Cash, Willia Craze, RN  Patient /Family stated Goal: sleep  Taken 08/13/2017 0313 by Burna Cash, Willia Craze, RN  Guidelines: Inpatient Nursing Guidelines  Individualized Interventions/Recommendations #2 (if applicable): Encourage Pt to call prior to getting OB  Individualized Interventions/Recommendations #3 (if applicable): Provide emotional support as Pt tearful throughout shift  Individualized Interventions/Recommendations #4 (if applicable): Closely monitor I/Os  Individualized Interventions/Recommendations #5 (if applicable): Assess skin integrity and encourage T+R while in bed  Outcome Evaluation (rationale for progressing/not progressing) every shift: Pt's VSS. Skin extremely fragile, red, ecchymotic. Pt turns self in bed indep. Dsg on lines to be changed, Pt sleeping. Dopamine & Flolan remain infusing via DL CVC line. Uses commode. Will continue to monitor Pt's u/o, gtts, skin integrity, respiratory status, safety, and overall status.  Taken 08/11/2017 0137 by Milinda Cave, RN  Individualized Interventions/Recommendations #1: Monitor for any changes in L neck hematoma.

## 2017-08-15 ENCOUNTER — Telehealth (HOSPITAL_BASED_OUTPATIENT_CLINIC_OR_DEPARTMENT_OTHER): Payer: Self-pay

## 2017-08-15 NOTE — Telephone Encounter (Addendum)
Transitional Telephonic Nurse (TTN)  Patient unreached via post discharge calls.    0930: TTN made first attempt to reach patient.  Mobile number rings then busy signal.  No VM available.  Home number "not accepting calls at this time".    1645: No call from pt by end of day. TTN will close this encounter.     Lianne Bushy, RN BSN  Transitional Telephonic Nursing  [facility]  Office [phone]  [email or web address]

## 2017-08-20 ENCOUNTER — Telehealth (HOSPITAL_COMMUNITY): Payer: Self-pay

## 2017-08-20 NOTE — Telephone Encounter (Signed)
Have attempted to call and email Ms. Toft.  She has not answered her phone and does not have VM.  She has not emailed response.  Dr. [name] aware.

## 2017-08-25 ENCOUNTER — Emergency Department (HOSPITAL_COMMUNITY): Payer: PPO

## 2017-08-25 ENCOUNTER — Inpatient Hospital Stay
Admission: EM | Admit: 2017-08-25 | Discharge: 2017-08-28 | DRG: 377 | Disposition: A | Discharge status: Home / Self Care | Payer: PPO | Attending: Pulmonary Disease | Admitting: Pulmonary Disease

## 2017-08-25 DIAGNOSIS — R609 Edema, unspecified: Secondary | ICD-10-CM

## 2017-08-25 DIAGNOSIS — M329 Systemic lupus erythematosus, unspecified: Secondary | ICD-10-CM | POA: Diagnosis present

## 2017-08-25 DIAGNOSIS — N185 Chronic kidney disease, stage 5: Secondary | ICD-10-CM | POA: Diagnosis present

## 2017-08-25 DIAGNOSIS — E872 Acidosis: Secondary | ICD-10-CM | POA: Diagnosis present

## 2017-08-25 DIAGNOSIS — R197 Diarrhea, unspecified: Secondary | ICD-10-CM | POA: Diagnosis present

## 2017-08-25 DIAGNOSIS — J9611 Chronic respiratory failure with hypoxia: Secondary | ICD-10-CM | POA: Diagnosis present

## 2017-08-25 DIAGNOSIS — R64 Cachexia: Secondary | ICD-10-CM | POA: Diagnosis present

## 2017-08-25 DIAGNOSIS — I272 Pulmonary hypertension, unspecified: Secondary | ICD-10-CM

## 2017-08-25 DIAGNOSIS — I2721 Secondary pulmonary arterial hypertension: Secondary | ICD-10-CM | POA: Diagnosis present

## 2017-08-25 DIAGNOSIS — E871 Hypo-osmolality and hyponatremia: Secondary | ICD-10-CM | POA: Diagnosis present

## 2017-08-25 DIAGNOSIS — Z8619 Personal history of other infectious and parasitic diseases: Secondary | ICD-10-CM

## 2017-08-25 DIAGNOSIS — D696 Thrombocytopenia, unspecified: Secondary | ICD-10-CM | POA: Diagnosis present

## 2017-08-25 DIAGNOSIS — Z452 Encounter for adjustment and management of vascular access device: Secondary | ICD-10-CM

## 2017-08-25 DIAGNOSIS — I50812 Chronic right heart failure: Secondary | ICD-10-CM | POA: Diagnosis present

## 2017-08-25 DIAGNOSIS — E8770 Fluid overload, unspecified: Secondary | ICD-10-CM | POA: Diagnosis present

## 2017-08-25 DIAGNOSIS — Z87891 Personal history of nicotine dependence: Secondary | ICD-10-CM

## 2017-08-25 DIAGNOSIS — R627 Adult failure to thrive: Secondary | ICD-10-CM | POA: Diagnosis present

## 2017-08-25 DIAGNOSIS — E43 Unspecified severe protein-calorie malnutrition: Secondary | ICD-10-CM | POA: Diagnosis present

## 2017-08-25 DIAGNOSIS — K921 Melena: Principal | ICD-10-CM | POA: Diagnosis present

## 2017-08-25 DIAGNOSIS — D62 Acute posthemorrhagic anemia: Secondary | ICD-10-CM | POA: Diagnosis present

## 2017-08-25 DIAGNOSIS — Z66 Do not resuscitate: Secondary | ICD-10-CM | POA: Diagnosis present

## 2017-08-25 DIAGNOSIS — R040 Epistaxis: Secondary | ICD-10-CM | POA: Diagnosis present

## 2017-08-25 DIAGNOSIS — N179 Acute kidney failure, unspecified: Secondary | ICD-10-CM | POA: Diagnosis present

## 2017-08-25 LAB — TROPONIN T GEN 5 W/REFLEX TO CK/CKMB
Troponin T Gen 5 w/Reflex CK/CKMB: 264 ng/L (ref <14)
Troponin T Gen 5 w/Reflex CK/CKMB: 266 ng/L (ref <14)

## 2017-08-25 LAB — CBC WITH DIFF, BLOOD
ANC-Manual Mode: 6 10*3/uL (ref 1.6–7.0)
Abs Basophils: 0 10*3/uL (ref <0.1)
Abs Eosinophils: 0 10*3/uL (ref 0.1–0.5)
Abs Lymphs: 0.6 10*3/uL — ABNORMAL LOW (ref 0.8–3.1)
Abs Monos: 0.1 10*3/uL — ABNORMAL LOW (ref 0.2–0.8)
Absolute Nucleated RBC: 0.1 10*3/uL (ref <0.1)
Basophils: 0 %
Eosinophils: 0 %
Hct: 19.2 % — ABNORMAL LOW (ref 34.0–45.0)
Hgb: 6.1 gm/dL — CL (ref 11.2–15.7)
Lymphocytes: 9 %
MCH: 28.4 pg (ref 26.0–32.0)
MCHC: 31.8 g/dL — ABNORMAL LOW (ref 32.0–36.0)
MCV: 89.3 um3 (ref 79.0–95.0)
Monocytes: 1 %
NRBC: 2 /100 WBC — ABNORMAL HIGH (ref <1)
Plt Count: 36 10*3/uL — ABNORMAL LOW (ref 140–370)
RBC: 2.15 10*6/uL — ABNORMAL LOW (ref 3.90–5.20)
RDW: 19.1 % — ABNORMAL HIGH (ref 12.0–14.0)
Segs: 89 %
WBC: 6.7 10*3/uL (ref 4.0–10.0)

## 2017-08-25 LAB — TYPE & SCREEN
ABO/RH: A POS
Antibody Screen: NEGATIVE

## 2017-08-25 LAB — COMPREHENSIVE METABOLIC PANEL, BLOOD
ALT (SGPT): 7 U/L (ref 0–33)
AST (SGOT): 20 U/L (ref 0–32)
Albumin: 3.7 g/dL (ref 3.5–5.2)
Alkaline Phos: 112 U/L (ref 35–140)
Anion Gap: 25 mmol/L — ABNORMAL HIGH (ref 7–15)
BUN: 221 mg/dL — ABNORMAL HIGH (ref 8–23)
Bicarbonate: 10 mmol/L — ABNORMAL LOW (ref 22–29)
Bilirubin, Tot: 0.33 mg/dL (ref <1.2)
Calcium: 9.1 mg/dL (ref 8.5–10.6)
Chloride: 98 mmol/L (ref 98–107)
Creatinine: 4.48 mg/dL — ABNORMAL HIGH (ref 0.51–0.95)
GFR: 10 mL/min
Glucose: 111 mg/dL — ABNORMAL HIGH (ref 70–99)
Potassium: 4.8 mmol/L (ref 3.5–5.1)
Sodium: 133 mmol/L — ABNORMAL LOW (ref 136–145)
Total Protein: 7 g/dL (ref 6.0–8.0)

## 2017-08-25 LAB — MDIFF
Immature Granulocytes Absolute Manual: 0.1 10*3/uL (ref 0.0–0.1)
Myelocytes: 1 %
Number of Cells Counted: 111
Plt Est: DECREASED
RBC Comment: NORMAL

## 2017-08-25 LAB — MAGNESIUM, BLOOD: Magnesium: 2.3 mg/dL (ref 1.6–2.4)

## 2017-08-25 LAB — PRO BNP, BLOOD: BNPP: >70000 pg/mL — ABNORMAL HIGH (ref 0–899)

## 2017-08-25 LAB — CKMB+INDEX (NO CPK), BLOOD
CK-MB: 5 ng/mL — ABNORMAL HIGH (ref 0.0–2.8)
CK-MB: 5 ng/mL — ABNORMAL HIGH (ref 0.0–2.8)

## 2017-08-25 LAB — CPK-CREATINE PHOSPHOKINASE, BLOOD
CPK: 29 U/L (ref 0–175)
CPK: 23 U/L (ref 0–175)

## 2017-08-25 LAB — PHOSPHORUS, BLOOD: Phosphorous: 8.9 mg/dL — ABNORMAL HIGH (ref 2.7–4.5)

## 2017-08-25 LAB — TSH, BLOOD: TSH: 4.47 u[IU]/mL — ABNORMAL HIGH (ref 0.27–4.20)

## 2017-08-25 LAB — PROTHROMBIN TIME, BLOOD
INR: 1.4
PT,Patient: 15.7 s — ABNORMAL HIGH (ref 9.7–12.5)

## 2017-08-25 MED ORDER — SODIUM CHLORIDE 0.9% TKO INFUSION
INTRAVENOUS | Status: DC | PRN
Start: 2017-08-25 — End: 2017-08-28

## 2017-08-25 MED ORDER — PROSTACYCLIN TUBING (~~LOC~~)
Status: DC
Start: 2017-08-26 — End: 2017-08-28
  Administered 2017-08-26 – 2017-08-28 (×2)
  Filled 2017-08-25: qty 1

## 2017-08-25 MED ORDER — FUROSEMIDE 40 MG OR TABS
80.00 mg | ORAL_TABLET | Freq: Three times a day (TID) | ORAL | Status: DC
Start: 2017-08-26 — End: 2017-08-28
  Administered 2017-08-26 – 2017-08-28 (×7): 80 mg via ORAL
  Filled 2017-08-25 (×7): qty 2

## 2017-08-25 MED ORDER — SODIUM CHLORIDE 0.9 % IJ SOLN (CUSTOM)
3.0000 mL | INTRAMUSCULAR | Status: DC | PRN
Start: 2017-08-25 — End: 2017-08-28

## 2017-08-25 MED ORDER — CALCIUM ACETATE (PHOS BINDER) 667 MG/5ML PO SOLN
667.00 mg | Freq: Three times a day (TID) | ORAL | Status: DC
Start: 2017-08-26 — End: 2017-08-28
  Administered 2017-08-26 – 2017-08-28 (×5): 667 mg via ORAL
  Filled 2017-08-25 (×11): qty 5

## 2017-08-25 MED ORDER — SODIUM CHLORIDE 0.9 % IV SOLN
0.30 ug/kg | Freq: Once | INTRAVENOUS | Status: AC
Start: 2017-08-25 — End: 2017-08-26
  Administered 2017-08-26: 12.8 ug via INTRAVENOUS
  Filled 2017-08-25: qty 3.2

## 2017-08-25 MED ORDER — GLYCINE DILUENT IV SOLN
35.0000 ng/kg/min | INTRAVENOUS | Status: DC
Start: 2017-08-25 — End: 2017-08-28
  Administered 2017-08-26 – 2017-08-28 (×9): 35 ng/kg/min via INTRAVENOUS
  Filled 2017-08-25 (×6): qty 6

## 2017-08-25 MED ORDER — SODIUM CHLORIDE 0.9 % IJ SOLN (CUSTOM)
3.0000 mL | Freq: Three times a day (TID) | INTRAMUSCULAR | Status: DC
Start: 2017-08-25 — End: 2017-08-28
  Administered 2017-08-26 – 2017-08-28 (×6): 3 mL via INTRAVENOUS

## 2017-08-25 MED ORDER — PROSTACYCLIN CASSETTE CHANGE (~~LOC~~)
Freq: Every day | Status: DC
Start: 2017-08-25 — End: 2017-08-28
  Administered 2017-08-25 – 2017-08-27 (×3)
  Filled 2017-08-25: qty 1

## 2017-08-25 MED ORDER — SODIUM BICARBONATE 650 MG OR TABS
650.00 mg | ORAL_TABLET | Freq: Two times a day (BID) | ORAL | Status: DC
Start: 2017-08-25 — End: 2017-08-26
  Administered 2017-08-26 (×2): 650 mg via ORAL
  Filled 2017-08-25 (×2): qty 1

## 2017-08-25 MED ORDER — HELP
80.00 mg | Freq: Three times a day (TID) | Status: DC
Start: 2017-08-25 — End: 2017-08-26
  Administered 2017-08-26 (×2): 80 mg via ORAL

## 2017-08-25 MED ORDER — PANTOPRAZOLE SODIUM 40 MG IV SOLR
40.0000 mg | Freq: Two times a day (BID) | INTRAVENOUS | Status: AC
Start: 2017-08-25 — End: 2017-08-28
  Administered 2017-08-25 – 2017-08-28 (×6): 40 mg via INTRAVENOUS
  Filled 2017-08-25 (×6): qty 40

## 2017-08-25 MED ORDER — PROSTACYCLIN BATTERIES - PUMP (~~LOC~~)
Status: DC
Start: 2017-08-31 — End: 2017-08-28
  Filled 2017-08-25: qty 1

## 2017-08-25 MED ORDER — NALOXONE HCL 0.4 MG/ML IJ SOLN
0.1000 mg | INTRAMUSCULAR | Status: DC | PRN
Start: 2017-08-25 — End: 2017-08-28

## 2017-08-25 MED ORDER — DOPAMINE IN D5W 1.6-5 MG/ML-% IV SOLN
2.00 ug/kg/min | INTRAVENOUS | Status: DC
Start: 2017-08-25 — End: 2017-08-28
  Administered 2017-08-26 – 2017-08-28 (×7): 2 ug/kg/min via INTRAVENOUS
  Filled 2017-08-25 (×2): qty 250

## 2017-08-25 NOTE — ED Notes (Signed)
The patient is on a flolan CADD pump. Pump is "RUN" mode upon arrival.  [place] volume is: 69.75 mls  Given: 29.25 mls  The pump delivers in 24 hours is : 72 ml per 24 hrs  The dosage in nanograms/per/kg is: 2 mcg/kg/min  Medication was last changed 1000 on 08/25/17. Due to be changed 1000.  How many bottles used to mix Flolan: 4  The bottle colors are: red caps. Husband says that there are 4 bottles, each with 1.5 mL in each cassette.  Pt's weight is: 42.7 kg  yes Pharmacy paged  yes Charge RN informed.  Verified settings and that pump is in RUN mode with  RN Marcos Eke  MD informed of patient's arrival.  The pt  does have a back up pump with them.

## 2017-08-25 NOTE — ED Notes (Signed)
Pt also arrives with dopamine infusion through CADD pump.  Pump is on RUN mode upon arrival.  Residual volume = 208.7.  Continuous rate = 4.6 mL/hr  mL given = 1314.9 mL since 07/14/17.

## 2017-08-25 NOTE — ED Notes (Signed)
Prostacyclin (Flolan/Veletri/Remodulin)      Prostacyclin MCP 333.1      Prostacyclin: Flolan - IV    Prostacyclin Dosing Weight: 86 kg     Dose: 35 ng/kg/min    Concentration:  60000 ng/mL    Rate: Flolan/Veletri/Remodulin IV CADD:  72 mL/24hr    Last Changed: 08/25/17@1000   62 mLs remaining at 2100  Changes every 1 days    Has 2 pumps in date?: yes  1). Serial # [id number] Due Date: 11/11/2017  2). Serial # [id number] Due Date: 08/07/2018  Pump Company: [place], [facility]

## 2017-08-25 NOTE — ED Notes (Signed)
08/25/2017 7:44 PM Diana Chambers    An EKG was handed to Dr. Edythe Lynn, along with previous EKG on MUSE.

## 2017-08-25 NOTE — ED Provider Notes (Signed)
Emergency Dept Provider Note    Chief Complaint:   Chief Complaint   Patient presents with   . Rectal Problem     Pt to ED reporting hematochezia and melena x 3 days, although last BM x 10 minutes ago was without blood.  Pt's husband says that Flolan causes chronic loose stools.  Pt also reports abdominal cramping.   . Abdominal Pain       HPI:  Diana Chambers is a 67 year old  female with hx of SLE c/b pulmonary arterial hypertension who p/w bloody diarrhea.  Patient required admission 2 weeks ago for left neck hematoma, treated conservatively.  Patient has a history of chronic RV failure, is on dopamine, sildenafil, IV Depot prostate in all.  Patient also has a history of C diff colitis, previously treated with p.o. vancomycin.  Patient states for the past 3 days she has been experiencing increasing hematochezia and melena.  Also experiencing generalized abdominal cramping.  Having 4-5 loose bowel movements a day, bloody.  Denies fevers or chills. States that does not seem like recurrent C diff a as stool does not smell in the same lies at has in the past.  States that she also has recurrent epistaxis, had an episode 2 days ago.  Has previously had darker stool in the past thought to be secondary to epistaxis.  Endorses generalized fatigue, shortness of breath that her baseline for her.    ROS:    Review of Systems   Constitutional: Negative for chills and fever.   HENT: Negative for congestion.    Respiratory: Positive for shortness of breath. Negative for cough.    Cardiovascular: Negative for chest pain.   Gastrointestinal: Positive for abdominal pain, blood in stool and melena.   Genitourinary: Negative for dysuria.   Musculoskeletal: Negative for myalgias.   Skin: Negative for rash.   Neurological: Negative for headaches.   Psychiatric/Behavioral: Negative for substance abuse.       All other systems reviewed and negative unless otherwise noted in the HPI or above. This was done per my custom and  practice for systems appropriate to the chief complaint in an emergency department setting and varies depending on the quality of history that the patient is able to provide.    Patient's medical history has been reviewed today as available in EPIC chart.  Primary MD: Renne Musca, June    Home Medications:     What To Do With Your Medications      CONTINUE taking these medications      Add'l Info   DOPamine 400 mg/250 mL infusion  Commonly known as:  INTROPIN  Inject 123 mcg/min into vein continuous.   Quantity:  100 mL  Refills:  1     epoetin alfa-epbx 4000 UNIT/ML  Commonly known as:  RETACRIT  Inject 1 mL (4,000 Units) under the skin once a week.   Quantity:  4 mL  Refills:  3     estradiol 2 MG vaginal ring  Commonly known as:  ESTRING  Insert 1 Ring vaginally Every 3 months. Follow package directions   Refills:  0     ferrous sulfate 324 (65 Fe) MG Tbec  Take 1 tablet (324 mg) by mouth daily.   Quantity:  30 tablet  Refills:  0     FLOLAN injection  29ng/kg/min  Generic drug:  epoprostenol   Refills:  0     furosemide 40 MG tablet  Commonly known as:  LASIX  Take  2.5 tablets (100 mg) by mouth 3 times daily.   Quantity:  200 tablet  Refills:  3     medroxyPROGESTERone 2.5 MG tablet  Commonly known as:  PROVERA  Take 1.25 mg by mouth daily.   Refills:  0     metolazone 5 MG tablet  Commonly known as:  ZAROXOLYN  Take 1 tablet (5 mg) by mouth daily.   Quantity:  30 tablet  Refills:  1     potassium chloride 20 MEQ tablet  Commonly known as:  KLOR-CON M20  Take 1 tablet (20 mEq) by mouth 2 times daily.   Quantity:  30 tablet  Refills:  3     sildenafil 20 MG tablet  Commonly known as:  REVATIO, VIAGRA  Take 4 tablets (80 mg) by mouth 3 times daily.   Quantity:  360 tablet  Refills:  2     spironolactone 50 MG tablet  Commonly known as:  ALDACTONE  Take one and a half tablets (75 mg) by mouth 2 times daily.   Quantity:  90 tablet  Refills:  1     vitamin b-12 500 MCG tablet  Commonly known as:  CYANOCOBALAMIN  TAKE 1  TABLET BY MOUTH DAILY.   Quantity:  30 each  Refills:  3     vitamin D3 2000 units capsule  Take 1 capsule by mouth daily.   Refills:  0            Allergies:   Allopurinol; Bumex [bumetanide]; Levaquin; Ofloxacin; Chlorhexidine; and Sulfa drugs    Past Medical History:  Past Medical History:   Diagnosis Date   . Pulmonary HTN (CMS-HCC)    . Sjoegren syndrome (CMS-HCC) 07/12/2009       Past Surgical History:  No past surgical history on file.    Family History:  Family History   Problem Relation Name Age of Onset   . Heart Disease Mother     . Hypertension Mother     . Psychiatry Mother     . Heart Disease Father         Social History:   Social History     Tobacco Use   . Smoking status: Former Smoker     Packs/day: 0.50     Years: 5.00     Pack years: 2.50     Last attempt to quit: 01/13/1978     Years since quitting: 39.6   . Smokeless tobacco: Never Used   Substance Use Topics   . Alcohol use: No   . Drug use: Not on file       Physical exam  Vital signs reviewed and noted:  Vitals:    08/25/17 2030 08/25/17 2043 08/25/17 2121 08/25/17 2133   BP:    103/60   BP Patient Position:    Supine   Pulse: 99 99  104   Resp: 22 20  20    Temp:       SpO2: 98% 98% 98% 100%   Weight:       Height:           Gen: No acute distress, nontoxic  Head: Normocephalic, atraumatic  Eyes: PERRL, EOMI, normal conjunctiva  ENT: MMM, OP clear, normal nares  Neck: Supple, no stridor or nuchal rigidity; lump over left neck from prior noted hematoma  Pulm: No respiratory distress, CTAB, no W/R/R  CV: RRR, 4/6 systolic murmur, strong pulses  GI: NABS, abdomen soft, minimal TTP in RLQ, ND, no rigidity  or guarding  GU: dark melanotic stool, few nonthrombosed external hemorrhoids  Back: No CVAT, no midline TTP  Ext: Nontender without swelling, deformity, or edema   Neuro: Alert, appropriately responding to questions, CN II-XII grossly intact, strength 5/5 in bilateral upper lower extremities, SILT throughout  Psych: Normal mood, affect, speech,  and cognition  Skin: Warm, well perfused, no diaphoresis, pallor or rash      Assessment/Clinical Decision-making/Plan  In summary, Shaterra Sanzone is a 67 year old  female with complex history as above who presents with melena and hematochezia.  Patient is afebrile, mildly tachycardic, satting appropriately on room air.  Patient appears euvolemic, stable from pulmonary arterial hypertension standpoint.  [place] team aware of her presence in the ED.  Will continue on Flolan an dopamine.    The patient's melena concerning especially given her history of easy bleeding, thrombocytopenia.  Will obtain screening labs as below.  Anticipate patient will require admission.  Will start PPI bolus b.i.d..  No history of cirrhosis, will defer octreotide and ceftriaxone.  Has never had an EGD or colonoscopy.  Patient also noted to have mild right lower abdominal pain, generally reassuring exam.  Low suspicion for appendicitis, diverticulitis or other acute intra-abdominal pathology.  Will defer any advanced imaging at this time.    Plan:  -Labs as below  -Meds as below  -Imaging as below    Medications   pantoprazole (PROTONIX) injection 40 mg (40 mg IntraVENOUS Given 08/25/17 2130)   epoprostenol (FLOLAN) 60,000 ng/mL in glycine diluent 100 mL infusion (has no administration in time range)   Prostacyclin Cassette Change (has no administration in time range)   Prostacyclin Tubing (has no administration in time range)   Prostacylin Batteries (has no administration in time range)   DOPamine (INTROPIN) 400 mg/250 mL infusion (1600 mcg/mL) (has no administration in time range)     Orders Placed This Encounter   Procedures   . X-Ray Chest Single View   . Comprehensive Metabolic Panel Green   . Magnesium, Blood Green Plasma Separator Tube   . Phosphorus, Blood Green Plasma Separator Tube   . Pro Bnp, Blood Green Plasma Separator Tube   . Troponin T Gen 5 w/Reflex to CK/CKMB Green Plasma Separator Tube   . Troponin T Gen 5 w/Reflex to  CK/CKMB Green Plasma Separator Tube   . Troponin T Gen 5 w/Reflex to CK/CKMB Green Plasma Separator Tube   . CBC w/ Diff Lavender   . TSH, Blood - See Instructions   . Prothrombin Time, Blood Blue   . GI Pathogen Nucleic Acid Detection Test   . Type & Screen Lavender   . CPK, Blood   . CKMB + Index (No CPK), Blood   . MDIFF   . ECG 12 Lead       ED COURSE and MEDICAL DECISION-MAKING:    Labs:  Results for orders placed or performed during the hospital encounter of 08/25/17   Comprehensive Metabolic Panel Green   Result Value Ref Range    Glucose 111 (H) 70 - 99 mg/dL    BUN 221 (H) 8 - 23 mg/dL    Creatinine 4.48 (H) 0.51 - 0.95 mg/dL    GFR 10 mL/min    Sodium 133 (L) 136 - 145 mmol/L    Potassium 4.8 3.5 - 5.1 mmol/L    Chloride 98 98 - 107 mmol/L    Bicarbonate 10 (L) 22 - 29 mmol/L    Anion Gap 25 (H) 7 -  15 mmol/L    Calcium 9.1 8.5 - 10.6 mg/dL    Total Protein 7.0 6.0 - 8.0 g/dL    Albumin 3.7 3.5 - 5.2 g/dL    Bilirubin, Tot 0.33 <1.2 mg/dL    AST (SGOT) 20 0 - 32 U/L    ALT (SGPT) 7 0 - 33 U/L    Alkaline Phos 112 35 - 140 U/L   Magnesium, Blood Green Plasma Separator Tube   Result Value Ref Range    Magnesium 2.3 1.6 - 2.4 mg/dL   Phosphorus, Blood Green Plasma Separator Tube   Result Value Ref Range    Phosphorous 8.9 (H) 2.7 - 4.5 mg/dL   Pro Bnp, Blood Green Plasma Separator Tube   Result Value Ref Range    BNPP >70,000 (H) 0 - 899 pg/mL   Troponin T Gen 5 w/Reflex to CK/CKMB Green Plasma Separator Tube   Result Value Ref Range    Troponin T Gen 5 w/Reflex CK/CKMB 264 (HH) <14 ng/L   CBC w/ Diff Lavender   Result Value Ref Range    WBC 6.7 4.0 - 10.0 1000/mm3    RBC 2.15 (L) 3.90 - 5.20 mill/mm3    Hgb 6.1 (LL) 11.2 - 15.7 gm/dL    Hct 19.2 (L) 34.0 - 45.0 %    MCV 89.3 79.0 - 95.0 um3    MCH 28.4 26.0 - 32.0 pgm    MCHC 31.8 (L) 32.0 - 36.0 g/dL    RDW 19.1 (H) 12.0 - 14.0 %    Plt Count 36 (L) 140 - 370 1000/mm3    Absolute Nucleated RBC 0.1 <0.1 1000/mm3    NRBC 2 (H) <1 /100 WBC    Segs 89 %     Lymphocytes 9 %    Monocytes 1 %    Eosinophils 0 %    Basophils 0 %    ANC-Manual Mode 6.0 1.6 - 7.0 1000/mm3    Abs Lymphs 0.6 (L) 0.8 - 3.1 1000/mm3    Abs Monos 0.1 (L) 0.2 - 0.8 1000/mm3    Abs Eosinophils 0.0 <0.1 - 0.5 1000/mm3    Abs Basophils 0.0 <0.1 1000/mm3    Diff Type Manual    TSH, Blood - See Instructions   Result Value Ref Range    TSH 4.47 (H) 0.27 - 4.20 uIU/mL   Prothrombin Time, Blood Blue   Result Value Ref Range    PT,Patient 15.7 (H) 9.7 - 12.5 sec    INR 1.4    Type & Screen Lavender   Result Value Ref Range    ABO/RH A POS     Antibody Screen NEG    CPK, Blood   Result Value Ref Range    CPK 29 0 - 175 U/L   CKMB + Index (No CPK), Blood   Result Value Ref Range    CK-MB 5.0 (H) 0.0 - 2.8 ng/mL    CK-MB Index - %   MDIFF   Result Value Ref Range    Myelocytes 1 %    Number of Cells Counted 111     Immature Granulocytes Absolute Manual 0.1 0.0 - 0.1 1000/mm3    Plt Est Decreased     RBC Comment Normal     Microcytic 1+     Poikilocytosis 1+     Polychromasia 2+     Acanthocytes 1+    PREPARE/CROSSMATCH PRBCS, 1 Units   Result Value Ref Range    Red Blood Cells Selected  08/25/2017 21:50 RV1     Dispense Status Selected     Coding System ISBT     Expiration [id number]     Type A POSITIVE     Barcoded Donation Number [id number]     Barcoded Product Code [id number]     Barcoded ABO/RH 6200      Labs Reviewed   COMPREHENSIVE METABOLIC PANEL, BLOOD - Abnormal; Notable for the following components:       Result Value    Glucose 111 (*)     BUN 221 (*)     Creatinine 4.48 (*)     Sodium 133 (*)     Bicarbonate 10 (*)     Anion Gap 25 (*)     All other components within normal limits   PHOSPHORUS, BLOOD - Abnormal; Notable for the following components:    Phosphorous 8.9 (*)     All other components within normal limits   PRO BNP, BLOOD - Abnormal; Notable for the following components:    BNPP >70,000 (*)     All other components within normal limits   TROPONIN T GEN 5 W/REFLEX TO CK/CKMB -  Abnormal; Notable for the following components:    Troponin T Gen 5 w/Reflex CK/CKMB 264 (*)     All other components within normal limits   CBC WITH DIFF, BLOOD - Abnormal; Notable for the following components:    RBC 2.15 (*)     Hgb 6.1 (*)     Hct 19.2 (*)     MCHC 31.8 (*)     RDW 19.1 (*)     Plt Count 36 (*)     NRBC 2 (*)     Abs Lymphs 0.6 (*)     Abs Monos 0.1 (*)     All other components within normal limits   TSH, BLOOD - Abnormal; Notable for the following components:    TSH 4.47 (*)     All other components within normal limits   PROTHROMBIN TIME, BLOOD - Abnormal; Notable for the following components:    PT,Patient 15.7 (*)     All other components within normal limits   CKMB+INDEX (NO CPK), BLOOD - Abnormal; Notable for the following components:    CK-MB 5.0 (*)     All other components within normal limits   MAGNESIUM, BLOOD   TYPE & SCREEN   CPK-CREATINE PHOSPHOKINASE, BLOOD   MDIFF   TROPONIN T GEN 5 W/REFLEX TO CK/CKMB   GASTROINTESTINAL PATHOGEN NUCLEIC ACID DETECTION TEST, STOOL   BASIC METABOLIC PANEL, BLOOD   TROPONIN T GEN 5 W/REFLEX TO CK/CKMB   MRSA SURVEILLANCE CULTURE   PREPARE/CROSSMATCH PRBCS       Imaging:  X-Ray Chest Single View    (Results Pending)       Medications administered:  Medications   pantoprazole (PROTONIX) injection 40 mg (40 mg IntraVENOUS Given 08/25/17 2130)   HELP 80 mg (has no administration in time range)   sodium chloride (PF) 0.9 % flush 3 mL (has no administration in time range)   sodium chloride (PF) 0.9 % flush 3 mL (has no administration in time range)   sodium chloride 0.9 % TKO infusion (has no administration in time range)   nalOXone (NARCAN) injection 0.1 mg (has no administration in time range)   epoprostenol (FLOLAN) 60,000 ng/mL in glycine diluent 100 mL infusion (has no administration in time range)   Prostacyclin Cassette Change (has no administration in time range)   Prostacyclin Tubing (  has no administration in time range)   Prostacylin Batteries  (has no administration in time range)   DOPamine (INTROPIN) 400 mg/250 mL infusion (1600 mcg/mL) (has no administration in time range)   desmopressin (DDAVP) 12.8 mcg in sodium chloride 0.9 % 50 mL IVPB (has no administration in time range)         NOTE: Labs/meds/vitals above auto-refreshed with the most recent information at the time of signing this note. Unless otherwise noted, all MDM and evaluation by this writer ended with results available at the time of signing this note. Significant changes in management plans and/or hospital course may have occurred thereafter.       Reassessment and plan:     08/25/17  2030 08/25/17  2043 08/25/17  2121 08/25/17  2133   BP:    103/60   Pulse: 99 99  104   Resp: 22 20  20    Temp:       SpO2: 98% 98% 98% 100%       CBC remarkable for no leukocytosis then noted hemoglobin is 6.1.  Patient consented for blood transfusion, will give 1 unit PRBC over 4 hours.  CMP show moderately worsening creatinine from baseline.  Elevated proBNP and troponin, suspect secondary to RV failure.    Patient seen and evaluated by pulmonary artery hypertension team.  Had goals of care discussion with patient and family, will make DNR/DNI, defer Cardiology and GI consult.  Patient will be admitted to pulmonary hypertension team, IM you level of care.      This note was created using voice recognition technology and may contain errors due to environmental circumstances.  These errors include but are not limited to grammatical errors, punctuation errors, spelling errors, etc.      Patient seen and discussed with the emergency medicine attending.      Silvestre Gunner, MD, PhD  PGY-3, Emergency Medicine       Mykaylah Ballman, [place]  Resident  08/25/17 1561       Arley Phenix, MD  09/04/17 [phone]

## 2017-08-25 NOTE — ED Notes (Signed)
67 y.o. Female.  AAO, sometimes slow to cognition.  VSS. Not steady, needs assistance.   Husband at bedside.  3 days of melena w/ occasional BRB streaking, occasional n/v after eating, poor PO intake.  Hx of pulmonary htn- on flolan and dopamine- see previous notes.     RR even, non labored, in nad. O2 sat 98% on RA.  BP stable.   Denies pain.  NSR to ST at 90-105 bpm.  Skin WPD, thin and fragile w/ skin tears to BUE.    Call light w/in reach, rails x2.

## 2017-08-25 NOTE — H&P (Signed)
PULMONARY VASCULAR DISEASE - HISTORY AND PHYSICAL    CC: Melena  HISTORY OF PRESENT ILLNESS  Diana Chambers is a 67 year old female with PMH SLE complicated by [facility] PAH on triple therapy in setting of anorexogen use, chronic hypoxemic respiratory failure on 3L, recent hospitalization for decompensated R heart failure who presented on 08/13 for melena. She reports epistaxis x3-4 days followed by melena with intermittent bright red blood x3 days. Family reports BM q2h for at least 6-8 bowel movements per day. On ROS, family notes that her weight has continued to decrease due to poor PO intake.     In the Emergency Department, she was tachycardiac, hemodynamically stable on room air.     PAST MEDICAL HISTORY/PAST SURGICAL HISTORY  Past Medical History:   Diagnosis Date   . Pulmonary HTN (CMS-HCC)    . Sjoegren syndrome (CMS-HCC) 07/12/2009     No past surgical history on file.  Patient Active Problem List    Diagnosis Date Noted   . Hematoma of neck, initial encounter 08/12/2017   . Acute-on-chronic kidney injury (CMS-HCC) 08/12/2017   . Pulmonary HTN (CMS-HCC) 08/10/2017   . Edema 05/16/2017   . Thrombocytopenia (CMS-HCC) 07/15/2016   . PAH (pulmonary artery hypertension) (CMS-HCC) 05/27/2016   . Volume overload 05/02/2016   . Systemic lupus erythematosus (CMS-HCC) 10/08/2011   . Moraxella catarrhalis pneumonia (CMS-HCC) 06/21/2011   . Pneumonia 06/15/2011   . Hypoxia 06/15/2011   . Chronic right-sided congestive heart failure (CMS-HCC) 06/15/2011   . Chest pain 06/07/2011   . Bilateral edema of lower extremity 06/07/2011   . 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 PAH (PULMONARY ARTERY HYPERTENSION) 08/07/2005     FAMILY HISTORY  Family History   Problem Relation Name Age of Onset   . Heart Disease Mother     . Hypertension Mother     . Psychiatry Mother     . Heart Disease Father       SOCIAL HISTORY  Social History     Socioeconomic History   . Marital status: Married     Spouse name:  Not on file   . Number of children: Not on file   . Years of education: Not on file   . Highest education level: Not on file   Occupational History   . Not on file   Social Needs   . Financial resource strain: Not on file   . Food insecurity:     Worry: Not on file     Inability: Not on file   . Transportation needs:     Medical: Not on file     Non-medical: Not on file   Tobacco Use   . Smoking status: Former Smoker     Packs/day: 0.50     Years: 5.00     Pack years: 2.50     Last attempt to quit: 01/13/1978     Years since quitting: 39.6   . Smokeless tobacco: Never Used   Substance and Sexual Activity   . Alcohol use: No   . Drug use: Not on file   . Sexual activity: Not on file   Lifestyle   . Physical activity:     Days per week: Not on file     Minutes per session: Not on file   . Stress: Not on file   Relationships   . Social connections:     Talks on phone: Not on file     Gets together: Not on  file     Attends religious service: Not on file     Active member of club or organization: Not on file     Attends meetings of clubs or organizations: Not on file     Relationship status: Not on file   . Intimate partner violence:     Fear of current or ex partner: Not on file     Emotionally abused: Not on file     Physically abused: Not on file     Forced sexual activity: Not on file   Other Topics Concern   . Not on file   Social History Narrative   . Not on file     MEDICATIONS/ALLERGIES  Allergies   Allergen Reactions   . Allopurinol Hives   . Bumex [Bumetanide] Other     Muscle aches and stiffness     . Levaquin Swelling and Other     Severe joint pain     . Ofloxacin Nausea and Vomiting   . Chlorhexidine Itching   . Sulfa Drugs Unspecified     No current facility-administered medications on file prior to encounter.      Current Outpatient Medications on File Prior to Encounter   Medication Sig Dispense Refill   . [DISCONTINUED] azelastine (ASTELIN) 0.1 % nasal spray Spray 1 spray into each nostril 2 times daily.  Use in each nostril as directed 1 bottle 3   . [DISCONTINUED] bumetanide (BUMEX) 2 MG tablet TAKE 1 TABLET BY MOUTH 3 TIMES DAILY. 90 each 0   . Cholecalciferol 2000 UNITS CAPS Take 1 capsule by mouth daily.     . [DISCONTINUED] colchicine 0.6 MG tablet TAKE 1 TABLET BY MOUTH DAILY. 30 each 0   . DOPamine (INTROPIN) 400 mg/250 mL infusion Inject 123 mcg/min into vein continuous. 100 mL 1   . epoetin alfa-epbx (RETACRIT) 4000 UNIT/ML Inject 1 mL (4,000 Units) under the skin once a week. 4 mL 3   . estradiol (ESTRING) 2 MG vaginal ring Insert 1 Ring vaginally Every 3 months. Follow package directions     . ferrous sulfate 324 (65 FE) MG TBEC Take 1 tablet (324 mg) by mouth daily. 30 tablet 0   . FLOLAN 1.5 MG IV SOLR 29ng/kg/min     . furosemide (LASIX) 40 MG tablet Take 2.5 tablets (100 mg) by mouth 3 times daily. (Patient taking differently: Take 80 mg by mouth 3 times daily. ) 200 tablet 3   . medroxyPROGESTERone (PROVERA) 2.5 MG tablet Take 1.25 mg by mouth daily.     . metolazone (ZAROXOLYN) 5 MG tablet Take 1 tablet (5 mg) by mouth daily. 30 tablet 1   . potassium chloride (KLOR-CON M20) 20 MEQ tablet Take 1 tablet (20 mEq) by mouth 2 times daily. 30 tablet 3   . sildenafil (REVATIO, VIAGRA) 20 MG tablet Take 4 tablets (80 mg) by mouth 3 times daily. 360 tablet 2   . spironolactone (ALDACTONE) 50 MG tablet Take one and a half tablets (75 mg) by mouth 2 times daily. 90 tablet 1   . [DISCONTINUED] vitamin B complex-vitamin C-folic acid (NEPHROCAPS) 1 MG capsule Take 1 capsule by mouth daily. 30 capsule 0   . vitamin b-12 (CYANOCOBALAMIN) 500 MCG tablet TAKE 1 TABLET BY MOUTH DAILY. 30 each 3     VITALS  Vitals:    08/25/17 2030 08/25/17 2043 08/25/17 2121 08/25/17 2133   BP:    103/60   BP Patient Position:    Supine  Pulse: 99 99  104   Resp: 22 20  20    Temp:       SpO2: 98% 98% 98% 100%   Weight:       Height:         Temperature:  [98.2 F (36.8 C)] 98.2 F (36.8 C) (08/13 1934)  Blood pressure (BP):  (103-108)/(51-60) 103/60 (08/13 2133)  Heart Rate:  [99-104] 104 (08/13 2133)  Respirations:  [20-22] 20 (08/13 2133)  Pain Score: 0 (08/13 2121)  O2 Device: None (Room air) (08/13 2133)  SpO2:  [98 %-100 %] 100 % (08/13 2133)    WEIGHTS  Weights (last 14 days)     Date/Time Weight Weight Source Percentage Weight Change (%) Who    08/25/17 1956  42.7 kg (94 lb 3.2 oz)  (Abnormal)   Bed scale  0 % RC    08/25/17 1934  --  Unable to weigh (comment required) weak  -- RC    Weight Source: weak by Leeroy Bock, RN at 08/25/17 1934            PHYSICAL EXAM  GEN: Pleasant, chronically ill-appearing, female in no acute distress, speaking in full sentences without use of accessory muscles   Eyes: Anicteric sclera. Extraocular movements intact  Skin: Evidence of ecchymoses throughout body.   CV: Normal S1 and S2. Systolic murmur. Distal radial pulses wnl.  RESP: Scattered crackles  ABD: Soft, non-tender. Distended. Normoactive bowel sounds  NEURO: Grossly moving all extremities  EXT: 1+ peripheral edema     LABS  Recent Labs     08/25/17  2037   WBC 6.7   HGB 6.1*   HCT 19.2*   MCV 89.3   PLT 36*     Recent Labs     08/25/17  2037   NA 133*   K 4.8   CL 98   BICARB 10*   CREAT 4.48*   GLU 111*   [place] 9.1   MG 2.3   PHOS 8.9*   TP 7.0   ALB 3.7   TBILI 0.33   AST 20   ALT 7   ALK 112     Recent Labs     08/25/17  2037   INR 1.4      Recent Labs     08/25/17  2037   TROPONIN 264*     Lab Results   Component Value Date    BNP 53 10/09/2008    BNPP >70,000 (H) 08/11/2017    BNPP 32,908 (H) 05/15/2017    BNPP 13,583 (H) 08/23/2016     PREVIOUS MICRO  Lab Results   Component Value Date    BLOODCULT No Growth after 5 day/s of incubation. 06/15/2011    BLOODCULT No Growth after 5 day/s of incubation. 06/15/2011    URINECULTURE (A) 06/11/2016     Staphylococcus aureus  >100,000 colonies/mL  Two colony types; same susceptibility pattern  Identification performed by [employer]( Maldi-ToF). This test  was developed and  its performance characteristics determined by [facility]. It has not been cleared or  approved by the U.S. Food and Drug Administration.  The FDA has  determined that such clearance or approval is not necessary.     .  Results for orders placed or performed in visit on 12/27/03   HIV 1/2 ANTIBODY, BLOOD   Result Value Ref Range    HIV 1/2 Antibody & P24 Antigen Assay NonReactive  ASSESSMENT AND PLAN  Diana Chambers is a 67 year old female with PMH SLE complicated by [facility] PAH on triple therapy in setting of anorexogen use, chronic hypoxemic respiratory failure on 3L, recent hospitalization for decompensated R heart failure who presented on 08/13 for melena.     # Melena, unclear if secondary to epistaxis vs UGIB in the setting of uremia, thrombocytopenia  - Monitor CBC q6h   - Continue PPI BID  - Initiate DDAVP  - Transfuse for Hgb >7. If needs additional PRBC, will administer with furosemide chaser. 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   Nutrition: Renaldiet with 1.5L fluid restriction  Glycemic control: SSI  Pain control:acetaminophen  PRN  Bowels:PRN  GI PPx: Not indicated  DVT HWE:XHBZ. Holding pharmacologic PPx, given thrombocytopenia  Mobility: Per RN  Goals of Care: DNR-DNI. Family is aware that her prognosis is poor, likely on the scale of days. Family wants discharge home as soon as possible. We had a prolonged discussion, offering discharge home for comfort care-like situation (Family declines Hospice, given concerns about euthanasia). Family wants trial of medical management with transfusions first.    This patient was seen and discussed with my supervising attending Dr. Massie Bougie.    Roseanne Reno, MD  Fellow, Pulmonary and [facility]     Attending Attestation:  Patient seen and discussed with the fellow, Dr. Augustin Coupe.  I have examined the patient and reviewed available radiology. I agree with the assessment and plan as outline above. 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 She was brought in by husband for epistaxis, dark stools, then bright red blood. She had transfusion recently in [place]. Was admitted last month and was deemed not a candidate for dialysis. Exam with cachexia, bruises all over, loud TR, intermittent S3, scant edema. Labs with severe renal failure, elevated BUN, anemia.     I spent 60 minutes discussing end-of-life with Diana Chambers and her husband. She will be DNR/DNI but wants to pursue blood transfusion. I would not recommend endoscopy or procedures as per prior discussion. She has expressed a desire to die at home but wishes for admission today. I have told her and her husband that she likely has hours to days left.     Kris Hartmann. Rudi Rummage, M.D., M.P.H.  Associate Clinical Professor of Medicine  Division of Pulmonary and Critical Care Medicine  Pager [phone]/PID# [id number]

## 2017-08-25 NOTE — ED Notes (Signed)
Dr Nene at the bedside for MSE.

## 2017-08-25 NOTE — ED Floor Report (Signed)
ED to IP Handoff    Report created by Jaye Beagle, RN at 10:45 PM 08/25/2017.     HANDOFF REPORT UPDATE/CHANGES (changes in patient status/care/events prior to transfer)  By who:  Time:   Additional information:                                                                                                                                                     Diana Chambers is a 67 year old female.    Brief Summary of ED Visit (to include focused assessment and neuro status):    67 y.o. Female.  AAO, sometimes slow to cognition.  VSS. Not steady, needs assistance.   Husband at bedside.  3 days of melena w/ occasional BRB streaking, occasional n/v after eating, poor PO intake.  Hx of pulmonary htn- on flolan and dopamine- see previous notes.     RR even, non labored, in nad. O2 sat 98% on RA.  BP stable.   Denies pain.  NSR to ST at 90-105 bpm.  Skin WPD, thin and fragile w/ skin tears to BUE.    Call light w/in reach, rails x2.       RN shift assessment exceptions to WDL: See above    Any significant events and interventions with responses:  See above    Radiologic studies not completed: n/a  (None unless otherwise noted)    Chief Complaint   Patient presents with   . Rectal Problem     Pt to ED reporting hematochezia and melena x 3 days, although last BM x 10 minutes ago was without blood.  Pt's husband says that Flolan causes chronic loose stools.  Pt also reports abdominal cramping.   . Abdominal Pain       Admitted for: Pulmonary Hypertension    Code Status:  Please refer to In-pt admitting doctors orders     Level of Care: IMU     Is patient septic? no If yes, complete below:    BC x 2 drawn? no  If No explain:  n/a    Repeat lactate needed? no  If Yes, when is it due?  n/a    All initial antibiotics given?  no  If No, explain:  n/a    Amount of IV fluids received 0 ml    Is patient on Heparin? no If yes, complete below:     Time Heparin bolus was given: n/a    Additional drips patient is on: n/a    Cardiac  rhythm: nsr to ST    Oxygen Delivery: None    Past Medical History:   Diagnosis Date   . Pulmonary HTN (CMS-HCC)    . Sjoegren syndrome (CMS-HCC) 07/12/2009       No past surgical history on file.    Allergies: Allopurinol; Bumex [bumetanide]; Levaquin;  Ofloxacin; Chlorhexidine; and Sulfa drugs    ED Fall Risk: (S) (!) Yes    Skin issues:  no    >> If yes, note areas of skin breakdown. See appropriate photos.      Ambulatory:  no    Sitter needed: no    Suicide Risk:  no    Isolation Required: yes     >> If yes , what type of isolation: Contact    Is patient in custody?  no    Is patient in restraints? no    Vitals:    08/25/17 2200 08/25/17 2214 08/25/17 2215 08/25/17 2230   BP: 104/55 104/55 99/56 98/56    BP Patient Position:       Pulse: 102 103 103 103   Resp: 24 20 21 21    Temp:  97.9 F (36.6 C)     SpO2: 100% 98% 99% 100%   Weight:       Height:           Tubes Collected: Green PST (08/25/17 2216 : Damien Fusi, RN)    Lab Results   Component Value Date    WBC 6.7 08/25/2017    RBC 2.15 (L) 08/25/2017    HGB 6.1 (LL) 08/25/2017    HCT 19.2 (L) 08/25/2017    MCV 89.3 08/25/2017    MCHC 31.8 (L) 08/25/2017    RDW 19.1 (H) 08/25/2017    PLT 36 (L) 08/25/2017       Lab Results   Component Value Date    NA 133 (L) 08/25/2017    K 4.8 08/25/2017    CL 98 08/25/2017    BICARB 10 (L) 08/25/2017    BUN 221 (H) 08/25/2017    CREAT 4.48 (H) 08/25/2017    GLU 111 (H) 08/25/2017    [place] 9.1 08/25/2017       Lab Results   Component Value Date    PHOS 8.9 (H) 08/25/2017    MG 2.3 08/25/2017       Lab Results   Component Value Date    CPK 29 08/25/2017    [place] 5.0 (H) 08/25/2017    TROPONIN 264 (HH) 08/25/2017       No results found for: PH, PCO2, O2CONTENT, IVHC3, IVBE, O2SAT, UNPH, UNPCO2, ARTPH, ARTPCO2, ARTO2CNT, IAHC3, IABE, ARTO2SAT, UNAPH, UNAPCO2    No results found for this visit on 08/25/17.      Patient Lines/Drains/Airways Status    Active PICC Line / CVC Line / PIV Line / Drain / Airway / Intraosseous  Line / Epidural Line / ART Line / Line Type / Wound     Name: Placement date: Placement time: Site: Days:    Peripheral IV - 20 G Left Antecubital  08/25/17   2032   Antecubital  less than 1                    ED Handoff Report is ready for review.  Admitting RN may reach Emergency Department RN, Jaye Beagle, RN, at [phone] with any questions.

## 2017-08-25 NOTE — ED Notes (Signed)
Pulm to bedside

## 2017-08-26 ENCOUNTER — Encounter (HOSPITAL_COMMUNITY): Payer: Self-pay | Admitting: Pulmonary Disease

## 2017-08-26 LAB — CBC WITH DIFF, BLOOD
ANC-Automated: 4.5 10*3/uL (ref 1.6–7.0)
ANC-Automated: 5.8 10*3/uL (ref 1.6–7.0)
ANC-Automated: 5 10*3/uL (ref 1.6–7.0)
Abs Basophils: 0 10*3/uL (ref <0.1)
Abs Basophils: 0 10*3/uL (ref <0.1)
Abs Basophils: 0 10*3/uL (ref <0.1)
Abs Eosinophils: 0 10*3/uL (ref 0.1–0.5)
Abs Eosinophils: 0 10*3/uL (ref 0.1–0.5)
Abs Eosinophils: 0 10*3/uL (ref 0.1–0.5)
Abs Lymphs: 0.9 10*3/uL (ref 0.8–3.1)
Abs Lymphs: 0.8 10*3/uL (ref 0.8–3.1)
Abs Lymphs: 0.7 10*3/uL — ABNORMAL LOW (ref 0.8–3.1)
Abs Monos: 0.3 10*3/uL (ref 0.2–0.8)
Abs Monos: 0.2 10*3/uL (ref 0.2–0.8)
Abs Monos: 0.2 10*3/uL (ref 0.2–0.8)
Absolute Nucleated RBC: 0.1 10*3/uL (ref <0.1)
Absolute Nucleated RBC: 0.1 10*3/uL (ref <0.1)
Absolute Nucleated RBC: 0.1 10*3/uL (ref <0.1)
Basophils: 0 %
Basophils: 0 %
Basophils: 0 %
Eosinophils: 0 %
Eosinophils: 0 %
Eosinophils: 0 %
Hct: 22.5 % — ABNORMAL LOW (ref 34.0–45.0)
Hct: 25.3 % — ABNORMAL LOW (ref 34.0–45.0)
Hct: 24.8 % — ABNORMAL LOW (ref 34.0–45.0)
Hgb: 6.9 gm/dL — CL (ref 11.2–15.7)
Hgb: 8.1 gm/dL — ABNORMAL LOW (ref 11.2–15.7)
Hgb: 8 gm/dL — ABNORMAL LOW (ref 11.2–15.7)
Imm Gran %: 8 % — ABNORMAL HIGH (ref <1)
Imm Gran %: 7 % — ABNORMAL HIGH (ref <1)
Imm Gran %: 7 % — ABNORMAL HIGH (ref <1)
Imm Gran Abs: 0.5 10*3/uL — ABNORMAL HIGH (ref <0.1)
Imm Gran Abs: 0.6 10*3/uL — ABNORMAL HIGH (ref <0.1)
Imm Gran Abs: 0.4 10*3/uL — ABNORMAL HIGH (ref <0.1)
Lymphocytes: 14 %
Lymphocytes: 11 %
Lymphocytes: 11 %
MCH: 28.2 pg (ref 26.0–32.0)
MCH: 28.7 pg (ref 26.0–32.0)
MCH: 28.8 pg (ref 26.0–32.0)
MCHC: 30.7 g/dL — ABNORMAL LOW (ref 32.0–36.0)
MCHC: 32 g/dL (ref 32.0–36.0)
MCHC: 32.3 g/dL (ref 32.0–36.0)
MCV: 91.8 um3 (ref 79.0–95.0)
MCV: 89.7 um3 (ref 79.0–95.0)
MCV: 89.2 um3 (ref 79.0–95.0)
Monocytes: 5 %
Monocytes: 3 %
Monocytes: 4 %
NRBC: 2 /100 WBC — ABNORMAL HIGH (ref <1)
NRBC: 1 /100 WBC (ref <1)
NRBC: 1 /100 WBC (ref <1)
Plt Count: 30 10*3/uL — ABNORMAL LOW (ref 140–370)
Plt Count: 28 10*3/uL — ABNORMAL LOW (ref 140–370)
Plt Count: 26 10*3/uL — ABNORMAL LOW (ref 140–370)
RBC: 2.45 10*6/uL — ABNORMAL LOW (ref 3.90–5.20)
RBC: 2.82 10*6/uL — ABNORMAL LOW (ref 3.90–5.20)
RBC: 2.78 10*6/uL — ABNORMAL LOW (ref 3.90–5.20)
RDW: 18.9 % — ABNORMAL HIGH (ref 12.0–14.0)
RDW: 18.2 % — ABNORMAL HIGH (ref 12.0–14.0)
RDW: 18.2 % — ABNORMAL HIGH (ref 12.0–14.0)
Segs: 73 %
Segs: 78 %
Segs: 79 %
WBC: 6.2 10*3/uL (ref 4.0–10.0)
WBC: 7.4 10*3/uL (ref 4.0–10.0)
WBC: 6.3 10*3/uL (ref 4.0–10.0)

## 2017-08-26 LAB — BASIC METABOLIC PANEL, BLOOD
Anion Gap: 28 mmol/L — ABNORMAL HIGH (ref 7–15)
Anion Gap: 25 mmol/L — ABNORMAL HIGH (ref 7–15)
BUN: 215 mg/dL — ABNORMAL HIGH (ref 8–23)
BUN: 209 mg/dL — ABNORMAL HIGH (ref 8–23)
Bicarbonate: 6 mmol/L — CL (ref 22–29)
Bicarbonate: 8 mmol/L — CL (ref 22–29)
Calcium: 8.7 mg/dL (ref 8.5–10.6)
Calcium: 8.4 mg/dL — ABNORMAL LOW (ref 8.5–10.6)
Chloride: 100 mmol/L (ref 98–107)
Chloride: 101 mmol/L (ref 98–107)
Creatinine: 4.3 mg/dL — ABNORMAL HIGH (ref 0.51–0.95)
Creatinine: 4.32 mg/dL — ABNORMAL HIGH (ref 0.51–0.95)
GFR: 10 mL/min
GFR: 10 mL/min
Glucose: 92 mg/dL (ref 70–99)
Glucose: 114 mg/dL — ABNORMAL HIGH (ref 70–99)
Potassium: 5 mmol/L (ref 3.5–5.1)
Potassium: 4.9 mmol/L (ref 3.5–5.1)
Sodium: 134 mmol/L — ABNORMAL LOW (ref 136–145)
Sodium: 134 mmol/L — ABNORMAL LOW (ref 136–145)

## 2017-08-26 LAB — PREPARE/CROSSMATCH PRBCS
Barcoded ABO/RH: 6200
Barcoded ABO/RH: 6200
Dispense Status: TRANSFUSED
Dispense Status: TRANSFUSED
Expiration: 201909072359
Expiration: 201909122359
Red Blood Cells: TRANSFUSED
Red Blood Cells: TRANSFUSED
Type: A POS
Type: A POS

## 2017-08-26 LAB — ECG 12-LEAD
ATRIAL RATE: 101 {beats}/min
P AXIS: 60 degrees
PR INTERVAL: 190 ms
QRS INTERVAL/DURATION: 96 ms
QT: 350 ms
QTC INTERVAL: 453 ms
R AXIS: 95 degrees
T AXIS: 26 degrees
VENTRICULAR RATE: 101 {beats}/min

## 2017-08-26 LAB — GLUCOSE (POCT): Glucose (POCT): 102 mg/dL — ABNORMAL HIGH (ref 70–99)

## 2017-08-26 MED ORDER — SPIRONOLACTONE 25 MG OR TABS
75.00 mg | ORAL_TABLET | Freq: Two times a day (BID) | ORAL | Status: DC
Start: 2017-08-26 — End: 2017-08-28
  Administered 2017-08-26 – 2017-08-28 (×5): 75 mg via ORAL
  Filled 2017-08-26 (×5): qty 3

## 2017-08-26 MED ORDER — ONDANSETRON HCL 4 MG/2ML IV SOLN
4.00 mg | Freq: Four times a day (QID) | INTRAMUSCULAR | Status: DC | PRN
Start: 2017-08-26 — End: 2017-08-28
  Administered 2017-08-27: 4 mg via INTRAVENOUS
  Filled 2017-08-26: qty 2

## 2017-08-26 MED ORDER — SODIUM BICARBONATE 650 MG OR TABS
650.00 mg | ORAL_TABLET | Freq: Four times a day (QID) | ORAL | Status: DC
Start: 2017-08-26 — End: 2017-08-28
  Administered 2017-08-26 – 2017-08-28 (×9): 650 mg via ORAL
  Filled 2017-08-26 (×9): qty 1

## 2017-08-26 MED ORDER — VITAMIN B-1 100 MG OR TABS
100.00 mg | ORAL_TABLET | Freq: Every day | ORAL | Status: DC
Start: 2017-08-26 — End: 2017-08-28
  Administered 2017-08-26 – 2017-08-28 (×3): 100 mg via ORAL
  Filled 2017-08-26 (×3): qty 1

## 2017-08-26 MED ORDER — SILDENAFIL CITRATE 20 MG OR TABS
80.00 mg | ORAL_TABLET | Freq: Three times a day (TID) | ORAL | Status: DC
Start: 2017-08-26 — End: 2017-08-28
  Administered 2017-08-26 – 2017-08-28 (×6): 80 mg via ORAL
  Filled 2017-08-26: qty 4

## 2017-08-26 NOTE — Progress Notes (Addendum)
PULMONARY VASCULAR DISEASE - PROGRESS NOTE    ID: Diana Chambers is a 67 year old female with PMH SLE complicated by [facility] PAH on triple therapy in setting of anorexogen use, chronic hypoxemic respiratory failure on 3L, recent hospitalization for decompensated R heart failure who presented on 08/13 for melena.    INTERVAL EVENTS/SUBJECTIVE  Afebrile, hemodynamically unstable on dopamine _0 , on RA  Patient comfortable, sleeping. Family denies repeat episodes of melena.   Laboratory studies were notable for hyponatremia 134, AGAP 28 with bicarbonate 6, BUN/Cr 215/4.30, normocytic anemia 6.9, thrombocytopenia 30. Troponinemia with 264 --> 266. BNPP >70,000      VITALS  Vitals:    08/26/17 0145 08/26/17 0250 08/26/17 0318 08/26/17 0731   BP: 115/55 113/61 113/53 111/51   BP Location: Right arm Right arm Right arm Left arm   BP Patient Position: Semi-Fowlers Semi-Fowlers Semi-Fowlers Semi-Fowlers   Pulse: 103 100 98 103   Resp: _1 Temp:  98 F (36.7 C)  97.6 F (36.4 C)   SpO2: 100% 100% 100% 100%   Weight:       Height:         Temperature:  [97.6 F (36.4 C)-98.2 F (36.8 C)] 97.6 F (36.4 C) (08/14 0731)  Blood pressure (BP): (97-119)/(51-61) 111/51 (08/14 0731)  Heart Rate:  [98-107] 103 (08/14 0731)  Respirations:  [15-24] 18 (08/14 0731)  Pain Score: 0 (08/14 0731)  O2 Device: None (Room air) (08/14 0318)  SpO2:  [98 %-100 %] 100 % (08/14 0731)    WEIGHTS  Weights (last 14 days)     Date/Time Weight Weight Source Percentage Weight Change (%) Who    08/25/17 1956  42.7 kg (94 lb 3.2 oz)  (Abnormal)   Bed scale  0 % RC    08/25/17 1934  --  Unable to weigh (comment required) weak  -- RC    Weight Source: weak by Leeroy Bock, RN at 08/25/17 1934            PHYSICAL EXAM  GEN: Pleasant, chronically ill-appearing, female in no acute distress, sleeping   Eyes: Anicteric sclera. Extraocular movements intact  Skin: Evidence of ecchymoses throughout body.   CV: Normal S1 and S2.  Systolic murmur. Distal radial pulses wnl.  RESP: Scattered crackles  ABD: Soft, non-tender. Distended. Normoactive bowel sounds  NEURO: Grossly moving all extremities  EXT: 1+ peripheral edema     LABS  Recent Labs     08/25/17  2037 08/26/17  0515   WBC 6.7 6.2   HGB 6.1* 6.9*   HCT 19.2* 22.5*   MCV 89.3 91.8   PLT 36* 30*   SEG 89 73   LYMPHS 9 14   MONOS 1 5   EOS 0 0     Recent Labs     08/25/17  2037   NA 133*   K 4.8   CL 98   BICARB 10*   BUN 221*   CREAT 4.48*   GLU 111*   [place] 9.1   MG 2.3   PHOS 8.9*   TP 7.0   ALB 3.7   TBILI 0.33   AST 20   ALT 7   ALK 112     Recent Labs     08/25/17  2037   INR 1.4      Recent Labs     08/25/17  2037 08/25/17  2205   CPK 29 23   CKMBH 5.0*  5.0*   TROPONIN 264* 266*     Lab Results   Component Value Date    BNP 53 10/09/2008    BNPP >70,000 (H) 08/25/2017    BNPP >70,000 (H) 08/11/2017    BNPP 32,908 (H) 05/15/2017     PREVIOUS MICRO  Lab Results   Component Value Date    BLOODCULT No Growth after 5 day/s of incubation. 06/15/2011    BLOODCULT No Growth after 5 day/s of incubation. 06/15/2011    URINECULTURE (A) 06/11/2016     Staphylococcus aureus  >100,000 colonies/mL  Two colony types; same susceptibility pattern  Identification performed by [employer]( Maldi-ToF). This test  was developed and its performance characteristics determined by [facility]. It has not been cleared or  approved by the U.S. Food and Drug Administration.  The FDA has  determined that such clearance or approval is not necessary.     .  Results for orders placed or performed in visit on 12/27/03   HIV 1/2 ANTIBODY, BLOOD   Result Value Ref Range    HIV 1/2 Antibody & P24 Antigen Assay NonReactive      ASSESSMENT AND PLAN  Diana Chambers is a 67 year old female with PMH SLE complicated by [facility] PAH on triple therapy in setting of anorexogen use, chronic hypoxemic respiratory failure on 3L, recent hospitalization for decompensated R heart failure  who presented on 08/13 for melena.     # Melena, unclear if secondary to epistaxis vs UGIB in the setting of uremia, thrombocytopenia  # Acute on chronic normocytic anemia   # Thrombocytopenia   - Monitor CBC q8h   - Continue PPI BID  - s/p DDAVP  - Transfuse for Hgb >7. If needs additional PRBC, will administer with furosemide chaser. 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 Hold off on Nephrology consultation, given Goals of Care    # Malnutrition  - Initiated thiamine    Electrolytes: Monitoring with repletion PRN.   Nutrition: Renaldiet with 1.5L fluid restriction  Glycemic control: SSI  Pain control:acetaminophen PRN  Bowels:PRN  GI PPx: Not indicated  DVT VOZ:DGUY. Holding pharmacologic PPx, given thrombocytopenia  Mobility: Per RN  Goals of Care: DNR-DNI. Family is aware that her prognosis is poor, likely on the scale of days. Yesterday. we had a prolonged discussion, offering discharge home for comfort care-like situation (Family declines Palliative and Hospice, given concerns about euthanasia). Patient wants to stay and pass in the hospital. Social Work consulted to assist with arrangements (Family has questions about transportation to morgue, etc)    This patient was seen and discussed with my supervising attending Dr. Massie Bougie.    Roseanne Reno, MD  Fellow, Pulmonary and [facility]       Attending Attestation:  Patient seen and discussed with the fellow, Dr. Augustin Coupe.  I have examined the patient and reviewed available radiology. I agree with the assessment and plan as outline above. 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 Tolerated transfusion. Exam with cachexia, bruises all over, loud TR, intermittent S3, scant edema. Labs with similar renal failure, improved Hgb. Plan for phos binder, hold KCl, restart lasix but hold spironolactone. SWK consult to help with end of life issues and support husband. Likely discharge to die at home tomorrow if Hgb remains stable. DNR/DNI. Not interested in hospice.     Kris Hartmann. Rudi Rummage, M.D., M.P.H.  Associate Clinical Professor of Medicine  Division of Pulmonary and Critical Care Medicine  Pager [phone]/PID# [id number]

## 2017-08-26 NOTE — Plan of Care (Signed)
Problem: Promotion of Health and Safety  Goal: Promotion of Health and Safety  Description  The patient remains safe, receives appropriate treatment and achieves optimal outcomes (physically, psychosocially, and spiritually) within the limitations of the disease process by discharge.    Information below is the current care plan.  Flowsheets  Taken 08/26/2017 1234 by Bosie Helper, RN  Patient /Family stated Goal: feel a little better  Outcome Evaluation (rationale for progressing/not progressing) every shift: pt's VS remain stable, Hgb 6.9 Plt 30 bicarb 6 no noted s/s of bleeding,transfused with 1 unit PRBC tolerated it well. on cont dopomine and flolan iv cont infusion. no BM today. Md spoke with pt and pt's husband regarding plan of care, social worker also consulted  Taken 08/26/2017 0531 by Abel Presto, RN  Guidelines: Inpatient Nursing Guidelines  Individualized Interventions/Recommendations #1: encourage patient to call nursing staff for any assitance necessary  Individualized Interventions/Recommendations #2 (if applicable): pt denies any pain at this time; denies nausea or diarrhea at this time; educated patient and family to notify nurse of any changes  Individualized Interventions/Recommendations #3 (if applicable): encourage patient to turn self q2 hours and PRN to prevent skin breakdown and relieve pressure on sacral area; skin kept clean/dry/intact

## 2017-08-26 NOTE — Interdisciplinary (Signed)
08/26/17 2147   Assessment   Assessment Type Initial   Referral Information   Referral Type End of Life/Bereavement   Social Assessment   Where was the patient admitted from? Home   Prior to Level of Function Needed Some Assist with Mobility   Primary Caretaker(s) Spouse/Partner   Permission to Contact Yes   Is Patient Minor? No   Plans/Interventions/Discharge   Anticipated Discharge Destination Unable to determine at this time     SW note for consult placed on 08/26/17 @13 :25 for resources - family wants understanding about what to do when patient passes    Pt is a married, English speaking, Caucasian 67 y/o woman who was admitted due to melena. 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    I met with pt and her husband, [name], at bed side and introduced myself and role. I am familiar with this pt and her spouse due to other recent hospitalizations; pt and husband also remembered me.     Pt and husband did confirm the reason for the consult and [name] shared that he had some questions but preferred to speak to me outside of his wife's room. He asked his wife for permission and she agreed.     [name] understands that his wife is actively dying and he wants to respect her wishes of staying in the hospital until she passes. She shared she is more comfortable being taken care by the hospital staff.     Pt resides in [place] with her husband and she is afraid she may not be able to make it at home. Additionally, she has a limited support system in [place] and she would not be able to receive the appropriate care if she returns home.     The possibility to do either hospice at home or GIP at [facility] was discussed. It was also explained that, should pt decide to transition to hospice, the IV gtt she is on (DOPAmine and Flolan) would have to be gradually d/c due to being considered life prolonging medications. [name] shared that at this time pt does not wish  to transition to hospice because it would be considered "euthanesia."    [name] was reassured that his and his wife's wishes would be honored.     [name] had appropriate questions about the "after." He reasonably wants to be prepared. [name] shared that his wife's wish is to be cremated; although he does not share this choice, he wants to honor it because "it is what she wants."   I explained to [name] that I would have to call a local funeral home to find the answers to his other questions and I would get back to him in the AM.   [name] verbalized understanding and expressed appreciation for SW time and support.     Plan  - SW to f/u with [name] and pt in the AM    Beverely Risen, MSW  Ph: [phone]  Pager: [phone]

## 2017-08-26 NOTE — Interdisciplinary (Signed)
Noted some small bloody oozing from right eye. Pt states "i've had that before" denies pain. Area cleansed with sterile gauze and NS. Diaper changed by CCP. CCP noted "small black stool". New purewick placed and attached to suction.

## 2017-08-26 NOTE — Plan of Care (Signed)
Problem: Promotion of Health and Safety  Goal: Promotion of Health and Safety  Description  The patient remains safe, receives appropriate treatment and achieves optimal outcomes (physically, psychosocially, and spiritually) within the limitations of the disease process by discharge.    Information below is the current care plan.  Outcome: Progressing  Flowsheets (Taken 08/26/2017 0531)  Guidelines: Inpatient Nursing Guidelines  Individualized Interventions/Recommendations #1: encourage patient to call nursing staff for any assitance necessary  Individualized Interventions/Recommendations #2 (if applicable): pt denies any pain at this time; denies nausea or diarrhea at this time; educated patient and family to notify nurse of any changes  Individualized Interventions/Recommendations #3 (if applicable): encourage patient to turn self q2 hours and PRN to prevent skin breakdown and relieve pressure on sacral area; skin kept clean/dry/intact  Outcome Evaluation (rationale for progressing/not progressing) every shift: No BMs this shift; 1 unit RBC given as ordered, pt tolerated well; Flolan and dopamine continue to run as ordered; VSS, pt denies any chest pain, SOB, dizziness, nausea or vomitting; resting comfortably in bed at this time

## 2017-08-26 NOTE — Interdisciplinary (Addendum)
08/26/17 0837   Initial Assessment   CM Initial Assessment Completed   Patient Information   Where was the patient admitted from? Home   Prior to Level of Function Ambulatory/Independent with ADL's   Assistive Device Other (Comment)  (POC supplied by [employer])   Prior [employer]) Spouse/Partner   Primary Contact Name, Number and Relationship Julyssa Kyer [phone] Cell or [phone] Home   Permission to Contact Yes   Secondary Contact Name, Number and Relationship None   Conservator/Public Guardian Name and [place] Other (Comment)  (Disabled)   Pensions consultant Other (Comment)  ([employer] [place] MDCR PPO)   [place] Affiliation   (Unknown)   Discharge Planning   Living Arrangements Spouse /Significant Other   Available Assistance/Support System Case Metallurgist;Children;Spouse / significant other   Type of Residence Private Residence;[place] No   Additional Services Not Applicable   Barriers to Discharge Awaiting clinical improvement;Awaiting diagnostic study   Patient/Family/Other Engaged in Discharge Planning Yes   Name, Relationship and Phone Number of Person Engaged in the Discharge Plan See Contact Person Listed   Patient Has Decision Making Capacity Yes   Patient/Family/Legal/Surrogate Decision Maker Has Been Given Options And Choice In The Selection of Post-Acute Care Providers Yes   Legal Designee/Surrogate Name/Relationship/Contact Info None   Family/Caregiver's Assessed for Readiness, willingness, and ability to provide or support self-management activities;Readiness to provide care to the patient   Respite Care Education and resources provided to the family   Patient/Family Are In Agreement With Discharge Plan Pending d/c needs   Public Health Clearance Needed No   Social Worker Consult   Do you need to see a Education officer, museum?  No   Readmission  Risk Assessment   Readmission Within 30 Days of Discharge No   Admission Was Unplanned   Patient Explanation  Got sicker   Family Explanation Got sicker   Additional Information On 30 Day Readmission None   Recent Hospitalizations (Within Last 6 Months) Yes   High Risk For Readmission No   Action Taken To Prevent Readmission After Discharge   (N/A)   Recommendations to the Physician Home health orders;Therapy orders;Case management consult   MOON   MOON Provided to Patient Not Applicable     Patient and spouse known to CM from multiple admissions at [facility].  She was last admitted on [facility] 4A/B on 08/13/2017.  Pt admitted on 08/25/2017 for:       ICD-10-CM ICD-9-CM    1. Pulmonary HTN (CMS-HCC) I27.20 416.8    2. Chronic right-sided congestive heart failure (CMS-HCC) I50.812 428.0      All information on facesheet verified as correct.    Lives with spouse in [facility] [place] [address] (Physical Address) and Mailing address: [address]  [place] [address].  Contact number: [phone] (home).  I-ADLs and has a POC supplied by [employer].  No Hx of SNF and is currently on service with [facility] [phone] and [facility] for Dopamine.  Pharmacy: [facility].  PCP: Seth Bake.  Ins: Payor: Theme park manager / Plan: House PPO / Product Type: Medicare Managed Care / .  LACE: 67.  LOC: Inpatient.    Barriers to d/c: clinical improvement.  Transportation: family.  DCP: TBD-Home vs. Home w/HH.  Pt and care team  aware and agree to further discuss d/c plan pending clinical improvement and care team recommendations.  CM to continue to follow for d/c needs or changes with d/c plan.  Initial UR completed.    Ria Clock, MSN, RN, CNL, [place]  [phone]

## 2017-08-26 NOTE — Interdisciplinary (Signed)
Report from off-going RN. Patient awake, resting in bed. Husband at bedside. Explained plan of care. (2) assists pt turn and skin check. Pt has scattered ecchymosis. Left neck hematoma size of golf ball. Pt husband states "if she lays on her left side it might bleed". Pillow placed so pt laying on right side. purwick in place. SL flushed. Right ij intact. Flolan dosing checked.

## 2017-08-26 NOTE — Interdisciplinary (Signed)
Pt having dry heaves. MD paged for PRN  Zofran iv order.

## 2017-08-27 LAB — BASIC METABOLIC PANEL, BLOOD
Anion Gap: 24 mmol/L — ABNORMAL HIGH (ref 7–15)
Anion Gap: 25 mmol/L — ABNORMAL HIGH (ref 7–15)
BUN: 206 mg/dL — ABNORMAL HIGH (ref 8–23)
BUN: 210 mg/dL — ABNORMAL HIGH (ref 8–23)
Bicarbonate: 9 mmol/L — CL (ref 22–29)
Bicarbonate: 8 mmol/L — CL (ref 22–29)
Calcium: 8 mg/dL — ABNORMAL LOW (ref 8.5–10.6)
Calcium: 8.2 mg/dL — ABNORMAL LOW (ref 8.5–10.6)
Chloride: 101 mmol/L (ref 98–107)
Chloride: 102 mmol/L (ref 98–107)
Creatinine: 4.19 mg/dL — ABNORMAL HIGH (ref 0.51–0.95)
Creatinine: 4.31 mg/dL — ABNORMAL HIGH (ref 0.51–0.95)
GFR: 11 mL/min
GFR: 10 mL/min
Glucose: 97 mg/dL (ref 70–99)
Glucose: 95 mg/dL (ref 70–99)
Potassium: 4.3 mmol/L (ref 3.5–5.1)
Potassium: 4.3 mmol/L (ref 3.5–5.1)
Sodium: 134 mmol/L — ABNORMAL LOW (ref 136–145)
Sodium: 135 mmol/L — ABNORMAL LOW (ref 136–145)

## 2017-08-27 LAB — CBC WITH DIFF, BLOOD
ANC-Automated: 4.9 10*3/uL (ref 1.6–7.0)
Abs Basophils: 0 10*3/uL (ref <0.1)
Abs Eosinophils: 0 10*3/uL (ref 0.1–0.5)
Abs Lymphs: 0.7 10*3/uL — ABNORMAL LOW (ref 0.8–3.1)
Abs Monos: 0.2 10*3/uL (ref 0.2–0.8)
Absolute Nucleated RBC: 0.1 10*3/uL (ref <0.1)
Basophils: 0 %
Eosinophils: 0 %
Hct: 25.4 % — ABNORMAL LOW (ref 34.0–45.0)
Hgb: 8.2 gm/dL — ABNORMAL LOW (ref 11.2–15.7)
Imm Gran %: 6 % — ABNORMAL HIGH (ref <1)
Imm Gran Abs: 0.4 10*3/uL — ABNORMAL HIGH (ref <0.1)
Lymphocytes: 11 %
MCH: 28.7 pg (ref 26.0–32.0)
MCHC: 32.3 g/dL (ref 32.0–36.0)
MCV: 88.8 um3 (ref 79.0–95.0)
Monocytes: 3 %
NRBC: 1 /100 WBC (ref <1)
Plt Count: 23 10*3/uL — ABNORMAL LOW (ref 140–370)
RBC: 2.86 10*6/uL — ABNORMAL LOW (ref 3.90–5.20)
RDW: 18.4 % — ABNORMAL HIGH (ref 12.0–14.0)
Segs: 80 %
WBC: 6.1 10*3/uL (ref 4.0–10.0)

## 2017-08-27 MED ORDER — SODIUM BICARBONATE 650 MG OR TABS
650.00 mg | ORAL_TABLET | Freq: Four times a day (QID) | ORAL | 0 refills | Status: AC
Start: 2017-08-27 — End: ?

## 2017-08-27 MED ORDER — METOLAZONE 5 MG OR TABS
5.00 mg | ORAL_TABLET | Freq: Every day | ORAL | 0 refills | Status: AC | PRN
Start: 2017-08-27 — End: ?

## 2017-08-27 MED ORDER — SODIUM BICARBONATE 650 MG OR TABS
650.00 mg | ORAL_TABLET | Freq: Four times a day (QID) | ORAL | 0 refills | Status: DC
Start: 2017-08-27 — End: 2017-08-27

## 2017-08-27 MED ORDER — FUROSEMIDE 40 MG OR TABS
80.00 mg | ORAL_TABLET | Freq: Three times a day (TID) | ORAL | 0 refills | Status: AC
Start: 2017-08-27 — End: ?
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
## 2017-08-27 NOTE — Plan of Care (Signed)
Problem: Promotion of Health and Safety  Goal: Promotion of Health and Safety  Description  The patient remains safe, receives appropriate treatment and achieves optimal outcomes (physically, psychosocially, and spiritually) within the limitations of the disease process by discharge.    Information below is the current care plan.  Outcome: Progressing  Flowsheets  Taken 08/27/2017 1630 by Abelardo Diesel., RN  Patient /Family stated Goal: " I don't know what to say"  Guidelines: Inpatient Nursing Guidelines  Individualized Interventions/Recommendations #2 (if applicable): Help patient and husband clearly think through Her end of life care.  Individualized Interventions/Recommendations #4 (if applicable): Encourage patient to turn Q2 and prn.  As patient get's weaker, RN and CCP to turn and reposition patient.  Outcome Evaluation (rationale for progressing/not progressing) every shift: Pt's vital signs remain stable.  Both patient and husband appear to be having a difficult time accepting that she may pass away soon.  Husband by arguing with MD and RN, patient by not responding to questions and feeling overwhelmed.  Taken 08/26/2017 2000 by Ezra Sites, RN  Individualized Interventions/Recommendations #1: Monitor s/s bleeding.  Individualized Interventions/Recommendations #3 (if applicable): Vitals Q2I.

## 2017-08-27 NOTE — Plan of Care (Signed)
Problem: Promotion of Health and Safety  Goal: Promotion of Health and Safety  Description  The patient remains safe, receives appropriate treatment and achieves optimal outcomes (physically, psychosocially, and spiritually) within the limitations of the disease process by discharge.    Information below is the current care plan.  Outcome: Not Progressing  Flowsheets  Taken 08/26/2017 2000 by Ezra Sites, RN  Patient /Family stated Goal: Sleep tonight.  Individualized Interventions/Recommendations #1: Monitor s/s bleeding.  Individualized Interventions/Recommendations #2 (if applicable): H/H checks q8h. Call MD HGB <7. transfuse PRN.  Individualized Interventions/Recommendations #3 (if applicable): Vitals C1T.  Individualized Interventions/Recommendations #4 (if applicable): Encourage rest and turn for comfort as tolerated. Pt to revisit hospice care. working with case management.  Taken 08/26/2017 0531 by Abel Presto, RN  Guidelines: Inpatient Nursing Guidelines

## 2017-08-27 NOTE — Interdisciplinary (Signed)
Nutrition Note    Evaluation Type: Initial    Recommendations:    - Continue renal 60gm pro diet.   - Further aggressive nutrition interventions not warranted given overall medical status/GOC.                                                                                                                                       A: Per MD: 67 year old female with [place] on triple therapy in setting of anorexogen use, chronic hypoxemic respiratory failure on 3L, recent hospitalization for decompensated R heart failurewho presented on 08/13 for melena.     Nutrition Summary   Current Nutrition Regimen: Renal 60gm protein diet  Source of Information: Chart Review;Patient not appropriate for interview(End of life)  Barriers to Intake: Decreased appetite(per EMR)  Additional Comments: Minimal PO intake per RN documentation. Only a few bites of meals consumed for the past 3 meal trays.   Adequacy of Nutrition Intake: Meeting <25% of estimated needs    Pt familiar to this RD from prior visits in the past. Pt with poor PO intake PTA per EMR data and ongoing wt loss over the past year approximately. Currently at end of life with hours to days remaining per MD notes.  Tray Items Taken for the past 168 hrs:   Number of Items Taken Number of Items on Tray Diet Tolerance   08/26/17 1000 1 4 Tolerates   08/26/17 1400 1.5 4 Tolerates   08/27/17 0800 1 4 Tolerates     Anthropometrics   Height - Most Recent Measurement   08/25/17 _0  (1.575 m)     Weight For Nutrition Equations: 42.6 kg (94 lb)  Weight for Equations Reflects?: Current lowest wt via bedscale 08/25/17.  BMI for Nutrition Calculations: 17.19   Ideal Body Weight (kg): 49.9   Percent of Ideal Body Weight: 85.45 %  Usual Body Weight (Dietary): 74.4 kg (164 lb)(per EMR data previously last at this time on 05/16/16)   Change from UBW (%): -42.68 %  Time Frame of Weight Change From UBW: Other (Comment)(~1 year and 3 months)    Wt Readings from  Last 20 Encounters:   08/25/17 (!) 42.7 kg (94 lb 3.2 oz)   08/10/17 (!) 47.2 kg (104 lb 0.9 oz)   07/15/17 50.9 kg (112 lb 3.4 oz)   01/21/17 58.9 kg (129 lb 14.4 oz)   10/15/16 67.1 kg (148 lb)   09/10/16 67.1 kg (148 lb)   08/22/16 66 kg (145 lb 8 oz)   08/13/16 63.2 kg (139 lb 5.3 oz)   08/13/16 63 kg (139 lb)   07/28/16 64.2 kg (141 lb 8.6 oz)   06/21/16 64 kg (141 lb 1.5 oz)   05/16/16 74.4 kg (164 lb 0.4 oz)   02/26/16 73.9 kg (162 lb 14.4 oz)   12/12/15 73.5 kg (162 lb)   06/20/15 73.5 kg (  162 lb)   02/12/15 73.4 kg (161 lb 12.8 oz)   12/21/14 76.3 kg (168 lb 5 oz)   09/27/14 75 kg (165 lb 6.4 oz)   04/12/14 73 kg (161 lb)   11/02/13 73.4 kg (161 lb 14.4 oz)     Estimated Needs  Calories: 1279 kcal/day - 1492 kcal/day (30 kcal/kg/day - 35 kcal/kg/day x 42.6 kg (94 lb))  Protein: 26 g/day - 64 g/day (0.6 g/kg/day - 1.5 g/kg/day x 42.6 kg (94 lb))  Fluids: Deferred to MD d/t renal fx    Mifflin-St. Jeor Equation: 175    Last Nutrition Focused Physical Exam - Pt familiar to this RD from prior visits - cachetic appearance per last visit 08/12/17.   Body Fat Loss    Orbital Unable to Assess (08/27/17 1306)   Upper Arm Unable to Assess (08/27/17 1306)   Thoracic/Lumbar Unable to Assess (08/27/17 1306)   Muscle Mass Loss    Georgie Chard Unable to Assess (08/27/17 1306)   Clavicle Bone Region Unable to Assess (08/27/17 1306)   Deltoid Unable to Assess (08/27/17 1306)   Scapula Bone Region Unable to Assess (08/27/17 1306)   Interosseous Unable to Assess (08/27/17 1306)   Anterior Thigh Unable to Assess (08/27/17 1306)   Patellar Regoin Unable to Assess (08/27/17 1306)   Posterior Calf Unable to Assess (08/27/17 1306)   Micronutrient Deficiency       Malnutrition Diagnostic Criteria - Chronic Illness  Malnutrition Designation: Severe  Weight Loss: >20% in 1 Year  Body Fat Loss: Severe  Muscle Mass Loss: Severe  Chronic Dx Status: Ongoing    Clinical Considerations:   Allergies: Allopurinol; Bumex [bumetanide]; Levaquin;  Ofloxacin; Chlorhexidine; and Sulfa drugs  IV Access - Peripheral:  Peripheral IV - 20 G Left Antecubital (Active)     IV Access - Central  CVC Double Lumen - 08/26/17 Present on admission Right Subclavian (Active)     Tubes and Drains:  External Device (Active)     GI: LBM today x2 (entirely liquid). +GI bleed. Rounded abdomen w/ +BS/flatus per RN head to toe assessment.   Stool Assessment for the past 168 hrs:   Stool Appearance Stool Color Stool Amount Stool Source   08/25/17 2123 Soft formed Red streaks;Black Medium Rectum   08/26/17 1830 -- -- -- Rectum   08/26/17 2205 Soft formed Black Small Rectum   08/27/17 0600 Soft formed Black Small Rectum   08/27/17 1105 Entirely liquid Black Small Rectum     Skin Integrity:  Skin Integrity (WDL): Exceptions to WDL  Skin Integrity  Skin Color: Pale or pallor  Skin Temp: Warm  Generalized Skin Integrity: Ecchymosis    Edema: None    Labs: reviewed, GFR 8  Recent Labs     08/25/17  2037  08/26/17  1600 08/26/17  2315 08/27/17  0818   NA 133*   < > 134* 134* 135*   K 4.8   < > 4.9 4.3 4.3   CL 98   < > 101 101 102   BICARB 10*   < > 8* 9* 8*   BUN 221*   < > 209* 206* 210*   CREAT 4.48*   < > 4.32* 4.19* 4.31*   GLU 111*   < > 114* 97 95   [place] 9.1   < > 8.4* 8.0* 8.2*   MG 2.3  --   --   --   --    PHOS 8.9*  --   --   --   --  ALK 112  --   --   --   --    ALT 7  --   --   --   --    AST 20  --   --   --   --    TBILI 0.33  --   --   --   --    ALB 3.7  --   --   --   --    WBC 6.7   < > 7.4 6.3 6.1   ABSNEUTRO 6.0   < > 5.8 5.0 4.9    < > = values in this interval not displayed.     No results found for: CHOL, HDL, LDLCALC, TRIG, LDLDIRECT    Lab Results   Component Value Date    A1C 5.5 07/20/2016     Recent Labs     08/26/17  2325   GLUCPOCT 102*     Lab Results   Component Value Date    VD2 <5 07/02/2017    VD3 38 07/02/2017    VDT 38 07/02/2017     Medication Review Comments:    IV:   . DOPamine 2 mcg/kg/min (08/27/17 0804)   . epoprostenol (FLOLAN) infusion 35  ng/kg/min (08/27/17 0804)   . sodium chloride       Scheduled:   . calcium acetate  667 mg TID WC   . furosemide  80 mg TID   . pantoprazole  40 mg Q12H NR   . Prostacyclin Cassette Change   Daily   . Prostacyclin Tubing   Once per day on Mon Wed Fri   . [START ON 08/31/2017] Prostacylin Batteries   Once per day on Mon   . sildenafil  80 mg TID   . sodium bicarbonate  650 mg 4x Daily   . sodium chloride (PF)  3 mL Q8H   . spironolactone  75 mg BID   . thiamine  100 mg Daily     Discharge: Renal 60gm protein    Education: N/A at this time    RD/DTR to monitor/evaluate: labs, wt trend, and po/nutrition support status, and S/S of new skin concerns.  Relayed recommendations to MD.     Will continue to follow patient per approved [place] Nutrition Prioritization Schedule guidelines. Nutrition Services remains available via [place] should patient medical status change.    Awanda Mink, RD, [place] Pager [phone]  08/27/2017

## 2017-08-27 NOTE — Progress Notes (Addendum)
PULMONARY VASCULAR DISEASE - PROGRESS NOTE    ID: Diana Chambers is a 67 year old female with PMH SLE complicated by [facility] PAH on triple therapy in setting of anorexogen use, chronic hypoxemic respiratory failure on 3L, recent hospitalization for decompensated R heart failure who presented on 08/13 for melena.    INTERVAL EVENTS/SUBJECTIVE  Afebrile, hemodynamically unstable on dopamine @2 , on RA  Patient comfortable. RN reports one episode of dark stool.  Laboratory studies were notable for hyponatremia 135, AGAP 28 with bicarbonate 8, BUN/Cr 210/4.31, normocytic anemia 8.2 (8.0), thrombocytopenia 23 (26)    VITALS  Vitals:    08/27/17 0600 08/27/17 0720 08/27/17 0934 08/27/17 1203   BP:  117/56 111/65 104/46   BP Location:  Left arm  Left arm   BP Patient Position:  Semi-Fowlers  Semi-Fowlers   Pulse: 108 106  120   Resp: 21 22  22    Temp:  98.3 F (36.8 C)  98.1 F (36.7 C)   SpO2:  94%  96%   Weight:       Height:         Temperature:  [97.5 F (36.4 C)-99.2 F (37.3 C)] 98.1 F (36.7 C) (08/15 1203)  Blood pressure (BP): (95-123)/(46-65) 104/46 (08/15 1203)  Heart Rate:  [101-120] 120 (08/15 1203)  Respirations:  [19-26] 22 (08/15 1203)  Pain Score: 0 (08/15 1203)  O2 Device: None (Room air) (08/15 1203)  SpO2:  [94 %-99 %] 96 % (08/15 1203)    WEIGHTS  Weights (last 14 days)     Date/Time Weight Weight Source Percentage Weight Change (%) Who    08/25/17 1956  42.7 kg (94 lb 3.2 oz)  (Abnormal)   Bed scale  0 % RC    08/25/17 1934  --  Unable to weigh (comment required) weak  -- RC    Weight Source: weak by Leeroy Bock, RN at 08/25/17 1934            PHYSICAL EXAM  GEN: Pleasant, chronically ill-appearing, female in no acute distress, sleeping   Eyes: Anicteric sclera. Extraocular movements intact  Skin: Evidence of ecchymoses throughout body.   CV: Normal S1 and S2. Systolic murmur. Distal radial pulses wnl.  RESP: Scattered crackles  ABD: Soft, non-tender. Distended.  Normoactive bowel sounds  NEURO: Grossly moving all extremities  EXT: 1+ peripheral edema     LABS  Recent Labs     08/26/17  1600 08/26/17  2315 08/27/17  0818   WBC 7.4 6.3 6.1   HGB 8.1* 8.0* 8.2*   HCT 25.3* 24.8* 25.4*   MCV 89.7 89.2 88.8   PLT 28* 26* 23*   SEG 78 79 80   LYMPHS 11 11 11    MONOS 3 4 3    EOS 0 0 0     Recent Labs     08/25/17  2037  08/26/17  1600 08/26/17  2315 08/27/17  0818   NA 133*   < > 134* 134* 135*   K 4.8   < > 4.9 4.3 4.3   CL 98   < > 101 101 102   BICARB 10*   < > 8* 9* 8*   BUN 221*   < > 209* 206* 210*   CREAT 4.48*   < > 4.32* 4.19* 4.31*   GLU 111*   < > 114* 97 95   Benson 9.1   < > 8.4* 8.0* 8.2*   MG 2.3  --   --   --   --  PHOS 8.9*  --   --   --   --    TP 7.0  --   --   --   --    ALB 3.7  --   --   --   --    TBILI 0.33  --   --   --   --    AST 20  --   --   --   --    ALT 7  --   --   --   --    ALK 112  --   --   --   --     < > = values in this interval not displayed.     Recent Labs     08/25/17  2037   INR 1.4      Recent Labs     08/25/17  2037 08/25/17  2205   CPK 29 23   CKMBH 5.0* 5.0*   TROPONIN 264* 266*     Lab Results   Component Value Date    BNP 53 10/09/2008    BNPP >70,000 (H) 08/25/2017    BNPP >70,000 (H) 08/11/2017    BNPP 32,908 (H) 05/15/2017     PREVIOUS MICRO  Lab Results   Component Value Date    BLOODCULT No Growth after 5 day/s of incubation. 06/15/2011    BLOODCULT No Growth after 5 day/s of incubation. 06/15/2011    URINECULTURE (A) 06/11/2016     Staphylococcus aureus  >100,000 colonies/mL  Two colony types; same susceptibility pattern  Identification performed by [employer]( Maldi-ToF). This test  was developed and its performance characteristics determined by [facility]. It has not been cleared or  approved by the U.S. Food and Drug Administration.  The FDA has  determined that such clearance or approval is not necessary.     .  Results for orders placed or performed in visit on 12/27/03   HIV 1/2  ANTIBODY, BLOOD   Result Value Ref Range    HIV 1/2 Antibody & P24 Antigen Assay NonReactive      ASSESSMENT AND PLAN  Diana Chambers is a 67 year old female with PMH SLE complicated by [facility] PAH on triple therapy in setting of anorexogen use, chronic hypoxemic respiratory failure on 3L, recent hospitalization for decompensated R heart failure who presented on 08/13 for melena.     # Melena, unclear if secondary to epistaxis vs UGIB in the setting of uremia, thrombocytopenia  # Diarrhea with negative C difficile  # Acute on chronic normocytic anemia, acute blood loss anemia  # Thrombocytopenia   - Monitor CBC q8h   - Continue PPI BID  - s/p DDAVP  - Transfuse for Hgb >7. If needs additional PRBC, will administer with furosemide chaser. Hold off on platelet transfusion unless actively bleeding, given risk for worsening pulmonary vasoconstriction  - Hold off on GI consultation, given Goals of Care    #SLE complicated by [facility] IPAH  #Chronic RVfailure   # Chronic respiratory failure, ruled out   - Monitor strict I/O and daily weights  -Continuedopamine@2  mcg/kg/min  - Continue PO furosemide TID, PO spironolactone BID  - Continue sildenafil, IV epoprostenol @35  ng/kg/min    # AKI on CKD, Stage 5, likely cardiorenal, not a dialysis candidate  # Metabolic acidosis  # Hyperphosphatemia  - Monitor UOP  - Diuresis as above  - Continue bicarbonate and phosphate binder  - Avoiding  other nephrotoxic agents  - Hold off on Nephrology consultation, given Goals of Care    # Failure to thrive  # Malnutrition, on thiamine  Goals of Care: DNR-DNI. Family is aware that her prognosis is poor, likely on the scale of days. Upon admission, we had a prolonged discussion, offering discharge home for comfort care-like situation (Family declines Palliative and Hospice, given concerns about euthanasia).This AM, family became frustrated when offered discharge, so that patient could pass at home.      Electrolytes:  Monitoring with repletion PRN.   Nutrition: Renaldiet with 1.5L fluid restriction  Glycemic control: SSI  Pain control:acetaminophen PRN  Bowels:PRN  GI PPx: Not indicated  DVT FTD:DUKG. Holding pharmacologic PPx, given thrombocytopenia  Mobility: Per RN    This patient was seen and discussed with my supervising attending Dr. Massie Bougie.    Diana Reno, MD  Fellow, Pulmonary and [facility]     Attending Attestation:  Patient seen and discussed with the fellow, Dr. Augustin Coupe.  I have examined the patient and reviewed available radiology. I agree with the assessment and plan as outline above. 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 No more overt bleeding, still with dark stools but no epistaxis. Her son is coming in this afternoon, does not want to discharge until he arrives. Husband unsure if they have supplies for home dopamine infusion. Exam with cachexia, bruises all over, loud TR, intermittent S3, scant edema. Labs with severe renal failure, elevated BUN, anemia but stabls Hgb since transfusion. DNR/DNI. Discharge to home once dopamine issues organized.     Diana Chambers, M.D., M.P.H.  Associate Clinical Professor of Medicine  Division of Pulmonary and Critical Care Medicine  Pager [phone]/PID# [id number]

## 2017-08-27 NOTE — Discharge Instructions (Signed)
-   Please continue same medications  - Please follow-up with Pulmonary Vascular Medicine

## 2017-08-27 NOTE — Discharge Summary (Signed)
Patient Name:  Diana Chambers    Principal Diagnosis (required):  Melena, unclear if secondary to epistaxis vs UGIB in the setting of uremia, thrombocytopenia    Hospital Problem List (required):  #SLE complicated byWHO Group IPAH  #Chronic RVfailure   # AKI on CKD, Stage 5, likely cardiorenal  # Severe malnutrition  # Metabolic acidosis  # Hyperphosphatemia  # Acute on chronic normocytic anemia, acute blood loss anemia  # Thrombocytopenia     Principal Procedure During This Hospitalization (required): None     Consultations Obtained During This Hospitalization: None    Reason for Admission to the Hospital / History of Present Illness:  Diana Chambers is a 67 year old female with [place] PAH on triple therapy in setting of anorexogen use, chronic hypoxemic respiratory failure on 3L, recent hospitalization for decompensated R heart failurewho presented on 08/13 for melena.    Hospital Course by Problem (required):  # Melena   On admission, her hemoglobin was 6.1. Packed red blood cell was administered. Proton pump inhibitor was initially initiated and discontinued, given low likelihood of ulcer-related disease. DDAVP was administered, given uremia. Further procedures such as endoscopies and Gastroenterology consultation was deferred, given lack of benefit in the setting of end-stage disease.     #SLE complicated byWHO Group IPAH  #Chronic RVfailure   # AKI on CKD, Stage 5, not dialysis candidate  # Failure to thrive with malnutrition  Bicarbonate was initiated. Family declined phosphate binder, given difficulty tolerating medication. Home medications for PAH including diuretics, dopamine, Revatio and IV epoprostenol were continued.     Multiple Goals of Care conversations were performed. Family understands that her prognosis is poor, estimating days to live. On 08/15, family elected to discharge home, so that she could pass at home, per prior family's wishes.          Tests Outstanding at Discharge Requiring Follow Up: None    Discharge Condition (required):  Poor.    Discharge Diet:  Renal.    Discharge Medications:     What To Do With Your Medications      START taking these medications      Add'l Info   sodium bicarbonate 650 MG tablet  Take 1 tablet (650 mg) by mouth 4 times daily.   Quantity:  120 tablet  Refills:  0        CONTINUE taking these medications      Add'l Info   DOPamine 400 mg/250 mL infusion  Commonly known as:  INTROPIN  Inject 123 mcg/min into vein continuous.   Quantity:  100 mL  Refills:  1     epoetin alfa-epbx 4000 UNIT/ML  Commonly known as:  RETACRIT  Inject 1 mL (4,000 Units) under the skin once a week.   Quantity:  4 mL  Refills:  3     estradiol 2 MG vaginal ring  Commonly known as:  ESTRING  Insert 1 Ring vaginally Every 3 months. Follow package directions   Refills:  0     ferrous sulfate 324 (65 Fe) MG Tbec  Take 1 tablet (324 mg) by mouth daily.   Quantity:  30 tablet  Refills:  0     FLOLAN injection  29ng/kg/min  Generic drug:  epoprostenol   Refills:  0     furosemide 40 MG tablet  Commonly known as:  LASIX  Take 2 tablets (80 mg) by mouth 3 times daily.   Quantity:  180 tablet  Refills:  0     medroxyPROGESTERone 2.5 MG tablet  Commonly known as:  PROVERA  Take 1.25 mg by mouth daily.   Refills:  0     metolazone 5 MG tablet  Commonly known as:  ZAROXOLYN  Take 1 tablet (5 mg) by mouth daily as needed (worsening volume overload).   Quantity:  30 tablet  Refills:  0     sildenafil 20 MG tablet  Commonly known as:  REVATIO, VIAGRA  Take 4 tablets (80 mg) by mouth 3 times daily.   Quantity:  360 tablet  Refills:  2     spironolactone 50 MG tablet  Commonly known as:  ALDACTONE  Take one and a half tablets (75 mg) by mouth 2 times daily.   Quantity:  90 tablet  Refills:  1     vitamin b-12 500 MCG tablet  Commonly known as:  CYANOCOBALAMIN  TAKE 1 TABLET BY MOUTH DAILY.   Quantity:  30 each  Refills:  3     vitamin D3 2000 units  capsule  Take 1 capsule by mouth daily.   Refills:  0        STOP taking these medications    potassium chloride 20 MEQ tablet  Commonly known as:  KLOR-CON M20           Where to Get Your Medications      These medications were sent to [facility] #[id number], [address]  [place], [place] [address]    Phone:  [phone]    sodium bicarbonate 650 MG tablet     Information about where to get these medications is not yet available    Ask your nurse or doctor about these medications   furosemide 40 MG tablet   metolazone 5 MG tablet       Allergies:  Allergies   Allergen Reactions   . Allopurinol Hives   . Bumex [Bumetanide] Other     Muscle aches and stiffness     . Levaquin Swelling and Other     Severe joint pain     . Ofloxacin Nausea and Vomiting   . Chlorhexidine Itching   . Sulfa Drugs Unspecified     Discharge Disposition:  Home.    Discharge Code Status:  Do Not Attempt Resuscitation / limited care as follows:  No dialysis  This code status is not changed from the time of admission.    Follow Up Appointments:    Scheduled appointments:  Future Appointments   Date Time Provider [place]   09/07/2017  2:00 PM Poch, Camelia Eng., MD [facility]     For appointments requested for after discharge that have not yet been scheduled, refer to the Post Discharge Referrals section of the After Visit Summary.    Discharging 35 Contact Information:  [facility] operator at [phone].    Greater than 30 minutes spent in discharge planning and coordination of care.    Kris Hartmann. Rudi Rummage, M.D., M.P.H.  Associate Clinical Professor of Medicine  Division of Pulmonary and Critical Care Medicine  Pager [phone]/PID# [id number]

## 2017-08-28 LAB — CBC WITH DIFF, BLOOD
ANC-Automated: 13.6 10*3/uL — ABNORMAL HIGH (ref 1.6–7.0)
Abs Basophils: 0 10*3/uL (ref <0.1)
Abs Eosinophils: 0 10*3/uL (ref 0.1–0.5)
Abs Lymphs: 0.8 10*3/uL (ref 0.8–3.1)
Abs Monos: 0.3 10*3/uL (ref 0.2–0.8)
Absolute Nucleated RBC: 0.1 10*3/uL (ref <0.1)
Basophils: 0 %
Eosinophils: 0 %
Hct: 23.8 % — ABNORMAL LOW (ref 34.0–45.0)
Hgb: 8 gm/dL — ABNORMAL LOW (ref 11.2–15.7)
Imm Gran %: 2 % — ABNORMAL HIGH (ref <1)
Imm Gran Abs: 0.3 10*3/uL — ABNORMAL HIGH (ref <0.1)
Lymphocytes: 5 %
MCH: 29.2 pg (ref 26.0–32.0)
MCHC: 33.6 g/dL (ref 32.0–36.0)
MCV: 86.9 um3 (ref 79.0–95.0)
Monocytes: 2 %
Plt Count: 19 10*3/uL — CL (ref 140–370)
RBC: 2.74 10*6/uL — ABNORMAL LOW (ref 3.90–5.20)
RDW: 18.6 % — ABNORMAL HIGH (ref 12.0–14.0)
Segs: 90 %
WBC: 15.1 10*3/uL — ABNORMAL HIGH (ref 4.0–10.0)

## 2017-08-28 LAB — RBC MORPHOLOGY

## 2017-08-28 LAB — BASIC METABOLIC PANEL, BLOOD
Anion Gap: 25 mmol/L — ABNORMAL HIGH (ref 7–15)
BUN: 222 mg/dL — ABNORMAL HIGH (ref 8–23)
Bicarbonate: 10 mmol/L — ABNORMAL LOW (ref 22–29)
Calcium: 8.7 mg/dL (ref 8.5–10.6)
Chloride: 97 mmol/L — ABNORMAL LOW (ref 98–107)
Creatinine: 4.4 mg/dL — ABNORMAL HIGH (ref 0.51–0.95)
GFR: 10 mL/min
Glucose: 114 mg/dL — ABNORMAL HIGH (ref 70–99)
Potassium: 4.1 mmol/L (ref 3.5–5.1)
Sodium: 132 mmol/L — ABNORMAL LOW (ref 136–145)

## 2017-08-28 LAB — MRSA SURVEILLANCE CULTURE

## 2017-08-28 NOTE — Interdisciplinary (Signed)
08/28/17 1122   Discharge [place] Other   Post Acute Services Referred To Outpatient Infusion   Post Acute Services Name, Number, Contact Details [facility], an [place], [place]  [phone]    Post Acute Resources Provided Patient Declined   A List of Community Resources and/or Shelters Provided Not Applicable   Assistance Needed None   Substance Abuse Referral   (N/A)   Additional Services Not Applicable   Supplies/Services  IV infusion/antibiotics   Patient/Family/Other Engaged in Discharge Planning Yes   Family/Caregiver's Assessed for Readiness, willingness, and ability to provide or support self-management activities;Readiness to provide care to the patient   Respite Care Education and resources provided to the family   Patient/Family Are In Agreement With Discharge Plan Yes   Patient Has Decision Making Capacity Yes   Patient/Family/Legal/Surrogate Decision Maker Has Been Given Options And Choice In The Selection of Post-Acute Care Providers Yes   Legal Designee/Surrogate Name/Relationship/Contact Info None   Public Health Clearance Needed No   If Medicare or Medicare Managed Care: Important Message from Medicare Given   If Medicare or Medicare Managed Care: Important Message from Medicare Given Not Applicable   [place] Provided to Patient Not Applicable   Discharge Planning Needs   Planned living arrangements after discharge: Home to resume home Dopamine infusion with [facility]   Advance Directive: No   Does this patient have CM discharge planning needs? Yes   CM Needs Met? Yes   Does this patient have SW discharge planning needs? No   Discharge [facility] Family   Final Discharge Destination   Final Discharge [facility], an  [place], [place]  [phone]      D/C orders received.  Dopamine supply to last until Tuesday 09/01/2017 delivered to bedside by [place] at 1015.  No other d/c needs identified.    Barriers to d/c: none.  Transportation: family.  DCP: Home to resume home Dopamine w/[facility].  Pt, family, [place] [facility], and care team aware and agree with d/c plan.  CM to sign-off.    Ria Clock, MSN, RN, CNL, [place]  [phone]

## 2017-08-28 NOTE — Plan of Care (Signed)
Upon discharge orders, patient was given all d/c material including future appointments, prescription information, instructions on diet and activity level, and reasons to call the physician. Patient and husband verbalized understanding. All questions answered. Pt was connected to home dopamine infusion. Pt given new Flolan cassette and extra cassette for home transport. Pt was cleaned and changed. Pt's left peripheral IV was discontinued with no complications. Pt was brought down via wheelchair by 2 CCPs. Patient was a/o x4, and in no acute distress. Pt had all belongings with her.

## 2017-08-28 NOTE — Plan of Care (Signed)
Problem: Promotion of Health and Safety  Goal: Promotion of Health and Safety  Description  The patient remains safe, receives appropriate treatment and achieves optimal outcomes (physically, psychosocially, and spiritually) within the limitations of the disease process by discharge.    Information below is the current care plan.  Outcome: Progressing  Flowsheets  Taken 08/27/2017 2035  Patient /Family stated Goal: "Go home tomorrow."  Taken 08/28/2017 0053  Guidelines: Inpatient Nursing Guidelines  Individualized Interventions/Recommendations #1: Monitor for s/sx of bleeding.  Individualized Interventions/Recommendations #2 (if applicable): Encourage turns every 2 hours, help reposition.  Individualized Interventions/Recommendations #3 (if applicable): Educate on cardiac monitoring while inpatient, especially with dopamine and flolan drips.  Outcome Evaluation (rationale for progressing/not progressing) every shift: Patient's husband refusing cardiac monitoring at this time following argument with primary team. Patient and family educated regarding tele monitoring. Flolan and dopamine drips maintained. Patient assisted with turns although states she feels uncomfortable. Moderate bleed noted from R eye, pressure applied and hemostasis achieved, MD notified.

## 2017-08-28 NOTE — Interdisciplinary (Signed)
HI referral sent to [facility] to resume home Dopamine infusion.  Confirmed with Fellow MD-Erica no dose change on Dopamine.  Spoke with spouse-James who is requesting a medication supply until next Tuesday September 01, 2017.  Spouse reports that pt has a "2-day med pump and not 1-day med pump".  CM reported information to [place] who will verify with their pharmacy on pump device/setting.  Per Lilianna, Dopamine supply to be delivered tomorrow between 1000-1100.  Updated pt, family, primary RN-Amy, and care team on med delivery status.    Ria Clock MSN/RN/CNL/PHN  Inpatient Care Manager  [phone]

## 2017-08-28 NOTE — Plan of Care (Signed)
Problem: Promotion of Health and Safety  Goal: Promotion of Health and Safety  Description  The patient remains safe, receives appropriate treatment and achieves optimal outcomes (physically, psychosocially, and spiritually) within the limitations of the disease process by discharge.    Information below is the current care plan.  Outcome: Progressing  Flowsheets  Taken 08/28/2017 0845 by Renard Matter., RN  Patient /Family stated Goal: Go home  Taken 08/28/2017 1217 by Renard Matter., RN  Guidelines: Inpatient Nursing Guidelines  Outcome Evaluation (rationale for progressing/not progressing) every shift: Pt with dopamine and flolan continuous IV infusing. Pt a/o x4, with flat affect. Pt responding to commands and taking PO medications. Pt's husband stating that he wants her to go home today, requesting for dopamine and flolan infusions.Scant bleeding noted to right eye. Gauze placed under right eye. Will continue to monitor until time of discharge.  Taken 08/28/2017 0053 by Milinda Cave, RN  Individualized Interventions/Recommendations #1: Monitor for s/sx of bleeding.  Individualized Interventions/Recommendations #2 (if applicable): Encourage turns every 2 hours, help reposition.  Individualized Interventions/Recommendations #3 (if applicable): Educate on cardiac monitoring while inpatient, especially with dopamine and flolan drips.  Taken 08/27/2017 1630 by Abelardo Diesel., RN  Individualized Interventions/Recommendations #4 (if applicable): Encourage patient to turn Q2 and prn.  As patient get's weaker, RN and CCP to turn and reposition patient.

## 2017-08-31 ENCOUNTER — Telehealth (HOSPITAL_BASED_OUTPATIENT_CLINIC_OR_DEPARTMENT_OTHER): Payer: Self-pay

## 2017-08-31 NOTE — Telephone Encounter (Signed)
Calls made in attempt to reach patient for post hospital follow up call. Voice mails left with call back information.   Redge Gainer RN, MSN, ACM   Transitional Telephonic Nursing   [facility]   [phone]  [email or web address]

## 2017-09-02 ENCOUNTER — Ambulatory Visit (HOSPITAL_COMMUNITY): Payer: PPO | Admitting: Pulmonary Medicine

## 2017-09-07 ENCOUNTER — Ambulatory Visit (HOSPITAL_COMMUNITY): Payer: PPO | Admitting: Pulmonary Medicine

## 2017-09-07 ENCOUNTER — Encounter (HOSPITAL_COMMUNITY): Payer: Self-pay

## 2017-09-13 DEATH — deceased

## 2019-01-27 ENCOUNTER — Encounter: Payer: Self-pay | Admitting: Hospital

## 2019-02-21 ENCOUNTER — Encounter (INDEPENDENT_AMBULATORY_CARE_PROVIDER_SITE_OTHER): Payer: Self-pay

## 2019-02-21 ENCOUNTER — Encounter (INDEPENDENT_AMBULATORY_CARE_PROVIDER_SITE_OTHER): Payer: Self-pay | Admitting: Hospital

## 2019-03-01 ENCOUNTER — Encounter (INDEPENDENT_AMBULATORY_CARE_PROVIDER_SITE_OTHER): Payer: Self-pay | Admitting: Hospital
